# Patient Record
Sex: Male | Born: 1955 | Race: Black or African American | Hispanic: No | Marital: Married | Smoking: Former smoker
Health system: Southern US, Community
[De-identification: ages and names within clinical notes are randomized; demographics above are authoritative.]

## PROBLEM LIST (undated history)

## (undated) DIAGNOSIS — G5603 Carpal tunnel syndrome, bilateral upper limbs: Secondary | ICD-10-CM

## (undated) DIAGNOSIS — Z8679 Personal history of other diseases of the circulatory system: Secondary | ICD-10-CM

## (undated) DIAGNOSIS — G8929 Other chronic pain: Secondary | ICD-10-CM

## (undated) DIAGNOSIS — I10 Essential (primary) hypertension: Secondary | ICD-10-CM

## (undated) DIAGNOSIS — K760 Fatty (change of) liver, not elsewhere classified: Secondary | ICD-10-CM

## (undated) DIAGNOSIS — F909 Attention-deficit hyperactivity disorder, unspecified type: Secondary | ICD-10-CM

## (undated) DIAGNOSIS — N400 Enlarged prostate without lower urinary tract symptoms: Secondary | ICD-10-CM

## (undated) DIAGNOSIS — G4733 Obstructive sleep apnea (adult) (pediatric): Secondary | ICD-10-CM

## (undated) DIAGNOSIS — E785 Hyperlipidemia, unspecified: Secondary | ICD-10-CM

## (undated) DIAGNOSIS — G4700 Insomnia, unspecified: Secondary | ICD-10-CM

## (undated) DIAGNOSIS — I2510 Atherosclerotic heart disease of native coronary artery without angina pectoris: Secondary | ICD-10-CM

## (undated) DIAGNOSIS — M1990 Unspecified osteoarthritis, unspecified site: Secondary | ICD-10-CM

## (undated) DIAGNOSIS — N189 Chronic kidney disease, unspecified: Secondary | ICD-10-CM

## (undated) DIAGNOSIS — K219 Gastro-esophageal reflux disease without esophagitis: Secondary | ICD-10-CM

## (undated) DIAGNOSIS — R0600 Dyspnea, unspecified: Secondary | ICD-10-CM

## (undated) DIAGNOSIS — D649 Anemia, unspecified: Secondary | ICD-10-CM

## (undated) DIAGNOSIS — G5601 Carpal tunnel syndrome, right upper limb: Secondary | ICD-10-CM

## (undated) HISTORY — PX: CERVICAL FUSION: SHX112

## (undated) HISTORY — DX: Gastro-esophageal reflux disease without esophagitis: K21.9

## (undated) HISTORY — PX: CARDIAC CATHETERIZATION: SHX172

## (undated) HISTORY — DX: Carpal tunnel syndrome, bilateral upper limbs: G56.03

## (undated) HISTORY — DX: Unspecified osteoarthritis, unspecified site: M19.90

## (undated) HISTORY — DX: Carpal tunnel syndrome, right upper limb: G56.01

## (undated) HISTORY — DX: Essential (primary) hypertension: I10

## (undated) HISTORY — DX: Fatty (change of) liver, not elsewhere classified: K76.0

## (undated) HISTORY — DX: Hyperlipidemia, unspecified: E78.5

## (undated) HISTORY — DX: Obstructive sleep apnea (adult) (pediatric): G47.33

## (undated) HISTORY — PX: LUMBAR FUSION: SHX111

---

## 1997-04-09 ENCOUNTER — Emergency Department (HOSPITAL_COMMUNITY): Admission: EM | Admit: 1997-04-09 | Discharge: 1997-04-09 | Payer: Self-pay | Admitting: Emergency Medicine

## 1997-07-20 ENCOUNTER — Encounter: Admission: RE | Admit: 1997-07-20 | Discharge: 1997-10-18 | Payer: Self-pay | Admitting: Internal Medicine

## 1997-09-17 ENCOUNTER — Ambulatory Visit: Admission: RE | Admit: 1997-09-17 | Discharge: 1997-09-17 | Payer: Self-pay | Admitting: Internal Medicine

## 1999-02-06 ENCOUNTER — Other Ambulatory Visit: Admission: RE | Admit: 1999-02-06 | Discharge: 1999-02-06 | Payer: Self-pay | Admitting: Urology

## 2001-01-05 HISTORY — PX: FINGER AMPUTATION: SHX636

## 2001-03-31 ENCOUNTER — Inpatient Hospital Stay (HOSPITAL_COMMUNITY): Admission: EM | Admit: 2001-03-31 | Discharge: 2001-04-05 | Payer: Self-pay | Admitting: Emergency Medicine

## 2001-03-31 ENCOUNTER — Encounter: Payer: Self-pay | Admitting: Emergency Medicine

## 2003-03-08 ENCOUNTER — Encounter: Payer: Self-pay | Admitting: Pulmonary Disease

## 2003-03-08 ENCOUNTER — Ambulatory Visit (HOSPITAL_BASED_OUTPATIENT_CLINIC_OR_DEPARTMENT_OTHER): Admission: RE | Admit: 2003-03-08 | Discharge: 2003-03-08 | Payer: Self-pay | Admitting: Internal Medicine

## 2004-05-06 ENCOUNTER — Ambulatory Visit: Payer: Self-pay | Admitting: Internal Medicine

## 2004-05-21 ENCOUNTER — Ambulatory Visit: Payer: Self-pay | Admitting: Internal Medicine

## 2004-05-21 ENCOUNTER — Ambulatory Visit: Payer: Self-pay

## 2004-07-22 ENCOUNTER — Ambulatory Visit: Payer: Self-pay | Admitting: Internal Medicine

## 2004-08-12 ENCOUNTER — Ambulatory Visit: Payer: Self-pay | Admitting: Internal Medicine

## 2004-10-01 ENCOUNTER — Ambulatory Visit: Payer: Self-pay | Admitting: Internal Medicine

## 2005-01-05 HISTORY — PX: ESOPHAGEAL DILATION: SHX303

## 2005-01-05 HISTORY — PX: COLONOSCOPY: SHX174

## 2005-01-05 LAB — HM COLONOSCOPY: HM Colonoscopy: NORMAL

## 2005-07-02 ENCOUNTER — Ambulatory Visit: Payer: Self-pay | Admitting: Internal Medicine

## 2005-07-31 ENCOUNTER — Ambulatory Visit: Payer: Self-pay | Admitting: Gastroenterology

## 2005-08-07 ENCOUNTER — Ambulatory Visit: Payer: Self-pay | Admitting: Internal Medicine

## 2005-08-28 ENCOUNTER — Encounter (INDEPENDENT_AMBULATORY_CARE_PROVIDER_SITE_OTHER): Payer: Self-pay | Admitting: *Deleted

## 2005-08-28 ENCOUNTER — Ambulatory Visit: Payer: Self-pay | Admitting: Gastroenterology

## 2005-12-16 ENCOUNTER — Ambulatory Visit: Payer: Self-pay | Admitting: Internal Medicine

## 2005-12-16 LAB — CONVERTED CEMR LAB
Basophils Absolute: 0.1 10*3/uL (ref 0.0–0.1)
Basophils Relative: 1.5 % — ABNORMAL HIGH (ref 0.0–1.0)
Eosinophil percent: 2.2 % (ref 0.0–5.0)
HCT: 38.7 % — ABNORMAL LOW (ref 39.0–52.0)
Hemoglobin: 13 g/dL (ref 13.0–17.0)
Lymphocytes Relative: 17.4 % (ref 12.0–46.0)
MCHC: 33.5 g/dL (ref 30.0–36.0)
MCV: 84.4 fL (ref 78.0–100.0)
Monocytes Absolute: 0.3 10*3/uL (ref 0.2–0.7)
Monocytes Relative: 4.8 % (ref 3.0–11.0)
Neutro Abs: 5.1 10*3/uL (ref 1.4–7.7)
Neutrophils Relative %: 74.1 % (ref 43.0–77.0)
Platelets: 279 10*3/uL (ref 150–400)
RBC: 4.59 M/uL (ref 4.22–5.81)
RDW: 13.3 % (ref 11.5–14.6)
WBC: 6.9 10*3/uL (ref 4.5–10.5)

## 2005-12-25 ENCOUNTER — Ambulatory Visit: Payer: Self-pay | Admitting: Internal Medicine

## 2005-12-25 LAB — CONVERTED CEMR LAB
Folate: 10 ng/mL
HCT: 38.3 % — ABNORMAL LOW (ref 39.0–52.0)
Hemoglobin: 12.6 g/dL — ABNORMAL LOW (ref 13.0–17.0)
INR: 0.9 (ref 0.9–2.0)
Iron: 80 ug/dL (ref 42–165)
MCHC: 33 g/dL (ref 30.0–36.0)
MCV: 85.2 fL (ref 78.0–100.0)
Platelets: 292 10*3/uL (ref 150–400)
Prothrombin Time: 11.9 s (ref 10.0–14.0)
RBC: 4.5 M/uL (ref 4.22–5.81)
RDW: 13.4 % (ref 11.5–14.6)
Saturation Ratios: 26.8 % (ref 20.0–50.0)
Transferrin: 213.1 mg/dL (ref >=212.0)
Vitamin B-12: 371 pg/mL (ref 211–911)
WBC: 4.7 10*3/uL (ref 4.5–10.5)
aPTT: 27.4 s (ref 26.5–36.5)

## 2006-03-11 ENCOUNTER — Ambulatory Visit: Payer: Self-pay | Admitting: Internal Medicine

## 2006-03-11 LAB — CONVERTED CEMR LAB
ALT: 80 units/L — ABNORMAL HIGH (ref 0–40)
AST: 45 units/L — ABNORMAL HIGH (ref 0–37)
BUN: 20 mg/dL (ref 6–23)
Cholesterol: 179 mg/dL (ref 0–200)
Creatinine, Ser: 1.5 mg/dL (ref 0.4–1.5)
HDL: 38.6 mg/dL — ABNORMAL LOW (ref >39.0)
Hemoglobin: 13 g/dL (ref 13.0–17.0)
LDL Cholesterol: 120 mg/dL — ABNORMAL HIGH (ref 0–99)
Potassium: 5 meq/L (ref 3.5–5.1)
Total CHOL/HDL Ratio: 4.6
Triglycerides: 103 mg/dL (ref 0–149)
VLDL: 21 mg/dL (ref 0–40)

## 2006-03-25 ENCOUNTER — Ambulatory Visit: Payer: Self-pay | Admitting: Internal Medicine

## 2006-05-24 ENCOUNTER — Encounter: Admission: RE | Admit: 2006-05-24 | Discharge: 2006-05-24 | Payer: Self-pay | Admitting: Internal Medicine

## 2006-05-24 ENCOUNTER — Ambulatory Visit: Payer: Self-pay | Admitting: Internal Medicine

## 2006-05-26 DIAGNOSIS — G4733 Obstructive sleep apnea (adult) (pediatric): Secondary | ICD-10-CM | POA: Insufficient documentation

## 2006-05-26 DIAGNOSIS — E1169 Type 2 diabetes mellitus with other specified complication: Secondary | ICD-10-CM | POA: Insufficient documentation

## 2006-05-26 DIAGNOSIS — E785 Hyperlipidemia, unspecified: Secondary | ICD-10-CM

## 2006-06-07 ENCOUNTER — Telehealth (INDEPENDENT_AMBULATORY_CARE_PROVIDER_SITE_OTHER): Payer: Self-pay | Admitting: *Deleted

## 2006-06-07 DIAGNOSIS — M545 Low back pain, unspecified: Secondary | ICD-10-CM | POA: Insufficient documentation

## 2006-06-11 ENCOUNTER — Ambulatory Visit: Payer: Self-pay | Admitting: Internal Medicine

## 2006-06-11 DIAGNOSIS — E1129 Type 2 diabetes mellitus with other diabetic kidney complication: Secondary | ICD-10-CM | POA: Insufficient documentation

## 2006-06-11 DIAGNOSIS — M549 Dorsalgia, unspecified: Secondary | ICD-10-CM

## 2006-06-11 DIAGNOSIS — E1121 Type 2 diabetes mellitus with diabetic nephropathy: Secondary | ICD-10-CM | POA: Insufficient documentation

## 2006-06-11 DIAGNOSIS — B360 Pityriasis versicolor: Secondary | ICD-10-CM | POA: Insufficient documentation

## 2006-06-11 DIAGNOSIS — G8929 Other chronic pain: Secondary | ICD-10-CM | POA: Insufficient documentation

## 2006-06-11 DIAGNOSIS — D6489 Other specified anemias: Secondary | ICD-10-CM | POA: Insufficient documentation

## 2006-06-12 ENCOUNTER — Encounter: Admission: RE | Admit: 2006-06-12 | Discharge: 2006-06-12 | Payer: Self-pay | Admitting: Internal Medicine

## 2006-06-12 LAB — CONVERTED CEMR LAB
ALT: 67 units/L — ABNORMAL HIGH (ref 0–40)
AST: 44 units/L — ABNORMAL HIGH (ref 0–37)
Albumin: 4 g/dL (ref 3.5–5.2)
Alkaline Phosphatase: 79 units/L (ref 39–117)
BUN: 19 mg/dL (ref 6–23)
Basophils Absolute: 0 10*3/uL (ref 0.0–0.1)
Basophils Relative: 0.2 % (ref 0.0–1.0)
Bilirubin, Direct: 0.1 mg/dL (ref 0.0–0.3)
CO2: 26 meq/L (ref 19–32)
Calcium: 9.7 mg/dL (ref 8.4–10.5)
Chloride: 108 meq/L (ref 96–112)
Cholesterol: 188 mg/dL (ref 0–200)
Creatinine, Ser: 1.4 mg/dL (ref 0.4–1.5)
Creatinine,U: 88.9 mg/dL
Eosinophils Absolute: 0.1 10*3/uL (ref 0.0–0.6)
Eosinophils Relative: 1.6 % (ref 0.0–5.0)
GFR calc Af Amer: 69 mL/min
GFR calc non Af Amer: 57 mL/min
Glucose, Bld: 163 mg/dL — ABNORMAL HIGH (ref 70–99)
HCT: 36.2 % — ABNORMAL LOW (ref 39.0–52.0)
HDL: 37.2 mg/dL — ABNORMAL LOW (ref >39.0)
Hemoglobin: 12.2 g/dL — ABNORMAL LOW (ref 13.0–17.0)
Hgb A1c MFr Bld: 11.8 % — ABNORMAL HIGH (ref 4.6–6.0)
LDL Cholesterol: 135 mg/dL — ABNORMAL HIGH (ref 0–99)
Lymphocytes Relative: 25.6 % (ref 12.0–46.0)
MCHC: 33.8 g/dL (ref 30.0–36.0)
MCV: 83 fL (ref 78.0–100.0)
Microalb Creat Ratio: 40.5 mg/g — ABNORMAL HIGH (ref 0.0–30.0)
Microalb, Ur: 3.6 mg/dL — ABNORMAL HIGH (ref 0.0–1.9)
Monocytes Absolute: 0.4 10*3/uL (ref 0.2–0.7)
Monocytes Relative: 8.9 % (ref 3.0–11.0)
Neutro Abs: 2.9 10*3/uL (ref 1.4–7.7)
Neutrophils Relative %: 63.7 % (ref 43.0–77.0)
Platelets: 246 10*3/uL (ref 150–400)
Potassium: 4.6 meq/L (ref 3.5–5.1)
RBC: 4.36 M/uL (ref 4.22–5.81)
RDW: 12.9 % (ref 11.5–14.6)
Sodium: 141 meq/L (ref 135–145)
Total Bilirubin: 0.7 mg/dL (ref 0.3–1.2)
Total CHOL/HDL Ratio: 5.1
Total Protein: 7.2 g/dL (ref 6.0–8.3)
Triglycerides: 78 mg/dL (ref 0–149)
VLDL: 16 mg/dL (ref 0–40)
WBC: 4.6 10*3/uL (ref 4.5–10.5)

## 2006-06-14 ENCOUNTER — Telehealth (INDEPENDENT_AMBULATORY_CARE_PROVIDER_SITE_OTHER): Payer: Self-pay | Admitting: *Deleted

## 2006-06-15 ENCOUNTER — Telehealth: Payer: Self-pay | Admitting: Internal Medicine

## 2006-06-17 ENCOUNTER — Ambulatory Visit: Payer: Self-pay | Admitting: Internal Medicine

## 2006-06-22 ENCOUNTER — Telehealth (INDEPENDENT_AMBULATORY_CARE_PROVIDER_SITE_OTHER): Payer: Self-pay | Admitting: *Deleted

## 2006-06-22 ENCOUNTER — Encounter (INDEPENDENT_AMBULATORY_CARE_PROVIDER_SITE_OTHER): Payer: Self-pay | Admitting: *Deleted

## 2006-06-28 ENCOUNTER — Ambulatory Visit: Payer: Self-pay | Admitting: Internal Medicine

## 2006-06-30 ENCOUNTER — Encounter: Payer: Self-pay | Admitting: Internal Medicine

## 2006-07-01 ENCOUNTER — Encounter: Payer: Self-pay | Admitting: Internal Medicine

## 2006-07-14 ENCOUNTER — Encounter: Payer: Self-pay | Admitting: Internal Medicine

## 2006-08-19 ENCOUNTER — Ambulatory Visit: Payer: Self-pay | Admitting: Internal Medicine

## 2006-08-19 LAB — CONVERTED CEMR LAB
ALT: 61 units/L — ABNORMAL HIGH (ref 0–53)
AST: 39 units/L — ABNORMAL HIGH (ref 0–37)
BUN: 32 mg/dL — ABNORMAL HIGH (ref 6–23)
Creatinine, Ser: 1.8 mg/dL — ABNORMAL HIGH (ref 0.4–1.5)
Hgb A1c MFr Bld: 7 % — ABNORMAL HIGH (ref 4.6–6.0)
Potassium: 4.8 meq/L (ref 3.5–5.1)

## 2006-08-26 ENCOUNTER — Ambulatory Visit: Payer: Self-pay | Admitting: Internal Medicine

## 2006-08-26 DIAGNOSIS — E161 Other hypoglycemia: Secondary | ICD-10-CM | POA: Insufficient documentation

## 2006-10-19 ENCOUNTER — Encounter: Payer: Self-pay | Admitting: Internal Medicine

## 2006-11-02 ENCOUNTER — Encounter: Payer: Self-pay | Admitting: Internal Medicine

## 2006-12-23 ENCOUNTER — Encounter: Payer: Self-pay | Admitting: Internal Medicine

## 2007-01-05 ENCOUNTER — Encounter: Payer: Self-pay | Admitting: Internal Medicine

## 2007-02-14 ENCOUNTER — Telehealth (INDEPENDENT_AMBULATORY_CARE_PROVIDER_SITE_OTHER): Payer: Self-pay | Admitting: *Deleted

## 2007-02-21 ENCOUNTER — Telehealth (INDEPENDENT_AMBULATORY_CARE_PROVIDER_SITE_OTHER): Payer: Self-pay | Admitting: *Deleted

## 2007-03-30 ENCOUNTER — Emergency Department (HOSPITAL_COMMUNITY): Admission: EM | Admit: 2007-03-30 | Discharge: 2007-03-30 | Payer: Self-pay | Admitting: Emergency Medicine

## 2007-03-30 ENCOUNTER — Telehealth (INDEPENDENT_AMBULATORY_CARE_PROVIDER_SITE_OTHER): Payer: Self-pay | Admitting: *Deleted

## 2007-04-06 ENCOUNTER — Ambulatory Visit: Payer: Self-pay | Admitting: Internal Medicine

## 2007-04-07 ENCOUNTER — Encounter: Payer: Self-pay | Admitting: Internal Medicine

## 2007-04-25 ENCOUNTER — Encounter: Payer: Self-pay | Admitting: Internal Medicine

## 2007-06-27 ENCOUNTER — Telehealth (INDEPENDENT_AMBULATORY_CARE_PROVIDER_SITE_OTHER): Payer: Self-pay | Admitting: *Deleted

## 2007-08-15 ENCOUNTER — Telehealth (INDEPENDENT_AMBULATORY_CARE_PROVIDER_SITE_OTHER): Payer: Self-pay | Admitting: *Deleted

## 2007-08-19 ENCOUNTER — Encounter: Admission: RE | Admit: 2007-08-19 | Discharge: 2007-08-19 | Payer: Self-pay | Admitting: Neurosurgery

## 2007-08-23 ENCOUNTER — Encounter: Payer: Self-pay | Admitting: Internal Medicine

## 2007-09-12 ENCOUNTER — Encounter: Payer: Self-pay | Admitting: Internal Medicine

## 2007-11-07 ENCOUNTER — Telehealth (INDEPENDENT_AMBULATORY_CARE_PROVIDER_SITE_OTHER): Payer: Self-pay | Admitting: *Deleted

## 2007-11-28 ENCOUNTER — Telehealth (INDEPENDENT_AMBULATORY_CARE_PROVIDER_SITE_OTHER): Payer: Self-pay | Admitting: *Deleted

## 2007-11-30 ENCOUNTER — Ambulatory Visit: Payer: Self-pay | Admitting: Internal Medicine

## 2007-11-30 DIAGNOSIS — M109 Gout, unspecified: Secondary | ICD-10-CM | POA: Insufficient documentation

## 2007-11-30 DIAGNOSIS — E559 Vitamin D deficiency, unspecified: Secondary | ICD-10-CM | POA: Insufficient documentation

## 2007-11-30 DIAGNOSIS — M2550 Pain in unspecified joint: Secondary | ICD-10-CM | POA: Insufficient documentation

## 2007-11-30 DIAGNOSIS — N400 Enlarged prostate without lower urinary tract symptoms: Secondary | ICD-10-CM | POA: Insufficient documentation

## 2007-12-09 ENCOUNTER — Encounter (INDEPENDENT_AMBULATORY_CARE_PROVIDER_SITE_OTHER): Payer: Self-pay | Admitting: *Deleted

## 2007-12-09 ENCOUNTER — Telehealth (INDEPENDENT_AMBULATORY_CARE_PROVIDER_SITE_OTHER): Payer: Self-pay | Admitting: *Deleted

## 2007-12-27 ENCOUNTER — Ambulatory Visit: Payer: Self-pay | Admitting: Pulmonary Disease

## 2008-01-19 ENCOUNTER — Encounter: Payer: Self-pay | Admitting: Internal Medicine

## 2008-01-20 ENCOUNTER — Encounter: Payer: Self-pay | Admitting: Pulmonary Disease

## 2008-01-23 ENCOUNTER — Encounter: Payer: Self-pay | Admitting: Internal Medicine

## 2008-03-13 ENCOUNTER — Telehealth: Payer: Self-pay | Admitting: Internal Medicine

## 2008-03-20 ENCOUNTER — Ambulatory Visit: Payer: Self-pay | Admitting: Internal Medicine

## 2008-04-17 ENCOUNTER — Ambulatory Visit: Payer: Self-pay | Admitting: Internal Medicine

## 2008-04-25 LAB — CONVERTED CEMR LAB
Basophils Absolute: 0 10*3/uL (ref 0.0–0.1)
Basophils Relative: 0.3 % (ref 0.0–3.0)
Creatinine, Ser: 1.5 mg/dL (ref 0.4–1.5)
Eosinophils Absolute: 0.1 10*3/uL (ref 0.0–0.7)
Eosinophils Relative: 2.5 % (ref 0.0–5.0)
Folate: 7.5 ng/mL
HCT: 35.7 % — ABNORMAL LOW (ref 39.0–52.0)
Hemoglobin: 11.7 g/dL — ABNORMAL LOW (ref 13.0–17.0)
Hgb A1c MFr Bld: 6.4 % (ref 4.6–6.5)
Iron: 54 ug/dL (ref 42–165)
Lymphocytes Relative: 24.2 % (ref 12.0–46.0)
Lymphs Abs: 1.4 10*3/uL (ref 0.7–4.0)
MCHC: 32.8 g/dL (ref 30.0–36.0)
MCV: 84.1 fL (ref 78.0–100.0)
Monocytes Absolute: 0.6 10*3/uL (ref 0.1–1.0)
Monocytes Relative: 11 % (ref 3.0–12.0)
Neutro Abs: 3.5 10*3/uL (ref 1.4–7.7)
Neutrophils Relative %: 62 % (ref 43.0–77.0)
Platelets: 196 10*3/uL (ref 150.0–400.0)
RBC: 4.25 M/uL (ref 4.22–5.81)
RDW: 13.9 % (ref 11.5–14.6)
Saturation Ratios: 16.7 % — ABNORMAL LOW (ref 20.0–50.0)
Transferrin: 231.4 mg/dL (ref 212.0–360.0)
Vitamin B-12: 315 pg/mL (ref 211–911)
WBC: 5.6 10*3/uL (ref 4.5–10.5)

## 2008-04-26 ENCOUNTER — Telehealth (INDEPENDENT_AMBULATORY_CARE_PROVIDER_SITE_OTHER): Payer: Self-pay | Admitting: *Deleted

## 2008-05-02 ENCOUNTER — Encounter (INDEPENDENT_AMBULATORY_CARE_PROVIDER_SITE_OTHER): Payer: Self-pay | Admitting: *Deleted

## 2008-05-28 ENCOUNTER — Encounter: Payer: Self-pay | Admitting: Internal Medicine

## 2008-05-29 ENCOUNTER — Encounter (INDEPENDENT_AMBULATORY_CARE_PROVIDER_SITE_OTHER): Payer: Self-pay | Admitting: *Deleted

## 2008-05-29 ENCOUNTER — Ambulatory Visit: Payer: Self-pay | Admitting: Internal Medicine

## 2008-05-29 LAB — CONVERTED CEMR LAB
OCCULT 1: NEGATIVE
OCCULT 2: NEGATIVE
OCCULT 3: NEGATIVE

## 2008-06-11 ENCOUNTER — Encounter: Payer: Self-pay | Admitting: Internal Medicine

## 2008-06-12 ENCOUNTER — Ambulatory Visit (HOSPITAL_COMMUNITY): Admission: RE | Admit: 2008-06-12 | Discharge: 2008-06-12 | Payer: Self-pay | Admitting: Neurosurgery

## 2008-06-15 ENCOUNTER — Encounter: Payer: Self-pay | Admitting: Internal Medicine

## 2008-07-24 ENCOUNTER — Encounter: Payer: Self-pay | Admitting: Internal Medicine

## 2008-08-02 ENCOUNTER — Ambulatory Visit: Payer: Self-pay | Admitting: Internal Medicine

## 2008-08-02 DIAGNOSIS — M48061 Spinal stenosis, lumbar region without neurogenic claudication: Secondary | ICD-10-CM | POA: Insufficient documentation

## 2008-08-07 ENCOUNTER — Encounter (INDEPENDENT_AMBULATORY_CARE_PROVIDER_SITE_OTHER): Payer: Self-pay | Admitting: *Deleted

## 2008-08-07 ENCOUNTER — Telehealth (INDEPENDENT_AMBULATORY_CARE_PROVIDER_SITE_OTHER): Payer: Self-pay | Admitting: *Deleted

## 2008-08-31 ENCOUNTER — Encounter: Payer: Self-pay | Admitting: Internal Medicine

## 2008-09-06 ENCOUNTER — Ambulatory Visit (HOSPITAL_COMMUNITY): Admission: RE | Admit: 2008-09-06 | Discharge: 2008-09-07 | Payer: Self-pay | Admitting: Neurosurgery

## 2008-09-26 ENCOUNTER — Encounter: Payer: Self-pay | Admitting: Internal Medicine

## 2008-12-04 ENCOUNTER — Encounter: Payer: Self-pay | Admitting: Internal Medicine

## 2009-01-01 ENCOUNTER — Telehealth (INDEPENDENT_AMBULATORY_CARE_PROVIDER_SITE_OTHER): Payer: Self-pay | Admitting: *Deleted

## 2009-01-23 ENCOUNTER — Encounter (INDEPENDENT_AMBULATORY_CARE_PROVIDER_SITE_OTHER): Payer: Self-pay | Admitting: *Deleted

## 2009-01-24 ENCOUNTER — Telehealth (INDEPENDENT_AMBULATORY_CARE_PROVIDER_SITE_OTHER): Payer: Self-pay | Admitting: *Deleted

## 2009-01-30 ENCOUNTER — Ambulatory Visit: Payer: Self-pay | Admitting: Internal Medicine

## 2009-02-01 LAB — CONVERTED CEMR LAB: Vit D, 25-Hydroxy: 59 ng/mL (ref 30–89)

## 2009-02-03 LAB — CONVERTED CEMR LAB
ALT: 22 units/L (ref 0–53)
AST: 24 units/L (ref 0–37)
Albumin: 4.4 g/dL (ref 3.5–5.2)
Alkaline Phosphatase: 66 units/L (ref 39–117)
Bilirubin, Direct: 0 mg/dL (ref 0.0–0.3)
Cholesterol: 125 mg/dL (ref 0–200)
HDL: 50.3 mg/dL (ref >39.00)
LDL Cholesterol: 58 mg/dL (ref 0–99)
Total Bilirubin: 0.7 mg/dL (ref 0.3–1.2)
Total CHOL/HDL Ratio: 2
Total Protein: 7.6 g/dL (ref 6.0–8.3)
Triglycerides: 85 mg/dL (ref 0.0–149.0)
VLDL: 17 mg/dL (ref 0.0–40.0)

## 2009-02-05 ENCOUNTER — Telehealth (INDEPENDENT_AMBULATORY_CARE_PROVIDER_SITE_OTHER): Payer: Self-pay | Admitting: *Deleted

## 2009-02-11 ENCOUNTER — Ambulatory Visit: Payer: Self-pay | Admitting: Internal Medicine

## 2009-02-11 LAB — CONVERTED CEMR LAB: Hgb A1c MFr Bld: 6.6 % — ABNORMAL HIGH (ref 4.6–6.5)

## 2009-02-15 ENCOUNTER — Ambulatory Visit: Payer: Self-pay | Admitting: Internal Medicine

## 2009-02-21 ENCOUNTER — Telehealth (INDEPENDENT_AMBULATORY_CARE_PROVIDER_SITE_OTHER): Payer: Self-pay | Admitting: *Deleted

## 2009-02-22 ENCOUNTER — Encounter: Payer: Self-pay | Admitting: Internal Medicine

## 2009-03-18 ENCOUNTER — Encounter: Payer: Self-pay | Admitting: Internal Medicine

## 2009-04-09 ENCOUNTER — Encounter: Payer: Self-pay | Admitting: Internal Medicine

## 2009-04-09 ENCOUNTER — Telehealth (INDEPENDENT_AMBULATORY_CARE_PROVIDER_SITE_OTHER): Payer: Self-pay | Admitting: *Deleted

## 2009-04-29 ENCOUNTER — Encounter: Admission: RE | Admit: 2009-04-29 | Discharge: 2009-06-19 | Payer: Self-pay | Admitting: Neurosurgery

## 2009-06-10 ENCOUNTER — Telehealth (INDEPENDENT_AMBULATORY_CARE_PROVIDER_SITE_OTHER): Payer: Self-pay | Admitting: *Deleted

## 2009-06-11 ENCOUNTER — Encounter: Payer: Self-pay | Admitting: Internal Medicine

## 2009-06-26 ENCOUNTER — Ambulatory Visit (HOSPITAL_COMMUNITY): Admission: RE | Admit: 2009-06-26 | Discharge: 2009-06-26 | Payer: Self-pay | Admitting: Neurosurgery

## 2009-06-27 ENCOUNTER — Encounter: Payer: Self-pay | Admitting: Internal Medicine

## 2009-07-01 ENCOUNTER — Telehealth (INDEPENDENT_AMBULATORY_CARE_PROVIDER_SITE_OTHER): Payer: Self-pay | Admitting: *Deleted

## 2009-08-12 ENCOUNTER — Telehealth (INDEPENDENT_AMBULATORY_CARE_PROVIDER_SITE_OTHER): Payer: Self-pay | Admitting: *Deleted

## 2009-09-03 ENCOUNTER — Encounter: Payer: Self-pay | Admitting: Internal Medicine

## 2009-10-07 ENCOUNTER — Telehealth (INDEPENDENT_AMBULATORY_CARE_PROVIDER_SITE_OTHER): Payer: Self-pay | Admitting: *Deleted

## 2009-10-21 ENCOUNTER — Telehealth (INDEPENDENT_AMBULATORY_CARE_PROVIDER_SITE_OTHER): Payer: Self-pay | Admitting: *Deleted

## 2009-11-11 ENCOUNTER — Telehealth (INDEPENDENT_AMBULATORY_CARE_PROVIDER_SITE_OTHER): Payer: Self-pay | Admitting: *Deleted

## 2009-12-09 ENCOUNTER — Encounter: Payer: Self-pay | Admitting: Gastroenterology

## 2009-12-09 ENCOUNTER — Ambulatory Visit: Payer: Self-pay | Admitting: Internal Medicine

## 2009-12-09 DIAGNOSIS — R109 Unspecified abdominal pain: Secondary | ICD-10-CM | POA: Insufficient documentation

## 2009-12-10 LAB — CONVERTED CEMR LAB
ALT: 14 units/L (ref 0–53)
AST: 21 units/L (ref 0–37)
Albumin: 4.5 g/dL (ref 3.5–5.2)
Alkaline Phosphatase: 79 units/L (ref 39–117)
Amylase: 104 units/L (ref 27–131)
BUN: 21 mg/dL (ref 6–23)
Basophils Absolute: 0 10*3/uL (ref 0.0–0.1)
Basophils Relative: 0.3 % (ref 0.0–3.0)
Bilirubin, Direct: 0.1 mg/dL (ref 0.0–0.3)
Creatinine, Ser: 1.7 mg/dL — ABNORMAL HIGH (ref 0.4–1.5)
Creatinine,U: 179.9 mg/dL
Eosinophils Absolute: 0.1 10*3/uL (ref 0.0–0.7)
Eosinophils Relative: 1.1 % (ref 0.0–5.0)
HCT: 39.3 % (ref 39.0–52.0)
Hemoglobin: 12.9 g/dL — ABNORMAL LOW (ref 13.0–17.0)
Hgb A1c MFr Bld: 6.4 % (ref 4.6–6.5)
Lipase: 35 units/L (ref 11.0–59.0)
Lymphocytes Relative: 27.5 % (ref 12.0–46.0)
Lymphs Abs: 1.6 10*3/uL (ref 0.7–4.0)
MCHC: 32.9 g/dL (ref 30.0–36.0)
MCV: 84.9 fL (ref 78.0–100.0)
Microalb Creat Ratio: 1 mg/g (ref 0.0–30.0)
Microalb, Ur: 1.8 mg/dL (ref 0.0–1.9)
Monocytes Absolute: 0.5 10*3/uL (ref 0.1–1.0)
Monocytes Relative: 7.9 % (ref 3.0–12.0)
Neutro Abs: 3.7 10*3/uL (ref 1.4–7.7)
Neutrophils Relative %: 63.2 % (ref 43.0–77.0)
Platelets: 245 10*3/uL (ref 150.0–400.0)
Potassium: 5.1 meq/L (ref 3.5–5.1)
RBC: 4.62 M/uL (ref 4.22–5.81)
RDW: 15.5 % — ABNORMAL HIGH (ref 11.5–14.6)
Total Bilirubin: 0.7 mg/dL (ref 0.3–1.2)
Total Protein: 7.7 g/dL (ref 6.0–8.3)
WBC: 5.9 10*3/uL (ref 4.5–10.5)

## 2009-12-17 ENCOUNTER — Encounter (INDEPENDENT_AMBULATORY_CARE_PROVIDER_SITE_OTHER): Payer: Self-pay | Admitting: *Deleted

## 2009-12-17 ENCOUNTER — Ambulatory Visit: Payer: Self-pay | Admitting: Internal Medicine

## 2009-12-17 LAB — CONVERTED CEMR LAB
OCCULT 1: NEGATIVE
OCCULT 2: NEGATIVE
OCCULT 3: NEGATIVE

## 2010-01-01 ENCOUNTER — Encounter: Payer: Self-pay | Admitting: Internal Medicine

## 2010-01-17 ENCOUNTER — Encounter: Payer: Self-pay | Admitting: Internal Medicine

## 2010-01-21 ENCOUNTER — Ambulatory Visit (HOSPITAL_COMMUNITY)
Admission: RE | Admit: 2010-01-21 | Discharge: 2010-01-21 | Payer: Self-pay | Source: Home / Self Care | Attending: Neurosurgery | Admitting: Neurosurgery

## 2010-01-21 ENCOUNTER — Ambulatory Visit
Admission: RE | Admit: 2010-01-21 | Discharge: 2010-01-21 | Payer: Self-pay | Source: Home / Self Care | Attending: Gastroenterology | Admitting: Gastroenterology

## 2010-01-23 ENCOUNTER — Encounter: Payer: Self-pay | Admitting: Internal Medicine

## 2010-01-24 ENCOUNTER — Ambulatory Visit (HOSPITAL_COMMUNITY)
Admission: RE | Admit: 2010-01-24 | Discharge: 2010-01-24 | Payer: Self-pay | Source: Home / Self Care | Attending: Neurosurgery | Admitting: Neurosurgery

## 2010-01-26 ENCOUNTER — Encounter: Payer: Self-pay | Admitting: Neurosurgery

## 2010-01-27 LAB — DIFFERENTIAL
Basophils Absolute: 0 10*3/uL (ref 0.0–0.1)
Basophils Relative: 0 % (ref 0–1)
Eosinophils Absolute: 0.1 10*3/uL (ref 0.0–0.7)
Eosinophils Relative: 2 % (ref 0–5)
Lymphocytes Relative: 28 % (ref 12–46)
Lymphs Abs: 1.5 10*3/uL (ref 0.7–4.0)
Monocytes Absolute: 0.4 10*3/uL (ref 0.1–1.0)
Monocytes Relative: 7 % (ref 3–12)
Neutro Abs: 3.4 10*3/uL (ref 1.7–7.7)
Neutrophils Relative %: 64 % (ref 43–77)

## 2010-01-27 LAB — CBC
HCT: 38.3 % — ABNORMAL LOW (ref 39.0–52.0)
Hemoglobin: 12.5 g/dL — ABNORMAL LOW (ref 13.0–17.0)
MCH: 26.4 pg (ref 26.0–34.0)
MCHC: 32.6 g/dL (ref 30.0–36.0)
MCV: 81 fL (ref 78.0–100.0)
Platelets: 211 10*3/uL (ref 150–400)
RBC: 4.73 MIL/uL (ref 4.22–5.81)
RDW: 14 % (ref 11.5–15.5)
WBC: 5.3 10*3/uL (ref 4.0–10.5)

## 2010-01-27 LAB — COMPREHENSIVE METABOLIC PANEL
ALT: 23 U/L (ref 0–53)
AST: 18 U/L (ref 0–37)
Albumin: 4.1 g/dL (ref 3.5–5.2)
Alkaline Phosphatase: 86 U/L (ref 39–117)
BUN: 13 mg/dL (ref 6–23)
CO2: 25 mEq/L (ref 19–32)
Calcium: 9.7 mg/dL (ref 8.4–10.5)
Chloride: 109 mEq/L (ref 96–112)
Creatinine, Ser: 1.55 mg/dL — ABNORMAL HIGH (ref 0.4–1.5)
GFR calc Af Amer: 57 mL/min — ABNORMAL LOW (ref >60)
GFR calc non Af Amer: 47 mL/min — ABNORMAL LOW (ref >60)
Glucose, Bld: 105 mg/dL — ABNORMAL HIGH (ref 70–99)
Potassium: 4.6 mEq/L (ref 3.5–5.1)
Sodium: 143 mEq/L (ref 135–145)
Total Bilirubin: 0.4 mg/dL (ref 0.3–1.2)
Total Protein: 7.2 g/dL (ref 6.0–8.3)

## 2010-01-28 LAB — KAPPA/LAMBDA LIGHT CHAINS
Kappa free light chain: 1.77 mg/dL (ref 0.33–1.94)
Kappa, lambda light chain ratio: 1.49 (ref 0.26–1.65)
Lambda free light chains: 1.19 mg/dL (ref 0.57–2.63)

## 2010-01-29 LAB — PROTEIN ELECTROPH W RFLX QUANT IMMUNOGLOBULINS
Albumin ELP: 56.9 % (ref 55.8–66.1)
Alpha-1-Globulin: 4.4 % (ref 2.9–4.9)
Alpha-2-Globulin: 12.1 % — ABNORMAL HIGH (ref 7.1–11.8)
Beta 2: 4.3 % (ref 3.2–6.5)
Beta Globulin: 6.8 % (ref 4.7–7.2)
Gamma Globulin: 15.5 % (ref 11.1–18.8)
M-Spike, %: NOT DETECTED g/dL
Total Protein ELP: 7.9 g/dL (ref 6.0–8.3)

## 2010-01-29 LAB — IMMUNOFIXATION ELECTROPHORESIS
IgA: 223 mg/dL (ref 68–378)
IgG (Immunoglobin G), Serum: 1410 mg/dL (ref 694–1618)
IgM, Serum: 33 mg/dL — ABNORMAL LOW (ref 60–263)
Total Protein ELP: 8.1 g/dL (ref 6.0–8.3)

## 2010-02-02 LAB — CONVERTED CEMR LAB
ALT: 31 units/L (ref 0–53)
AST: 29 units/L (ref 0–37)
Albumin: 5 g/dL (ref 3.5–5.2)
Alkaline Phosphatase: 68 units/L (ref 39–117)
BUN: 22 mg/dL (ref 6–23)
BUN: 24 mg/dL — ABNORMAL HIGH (ref 6–23)
Basophils Absolute: 0 10*3/uL (ref 0.0–0.1)
Basophils Absolute: 0 10*3/uL (ref 0.0–0.1)
Basophils Relative: 0 % (ref 0–1)
Basophils Relative: 0.5 % (ref 0.0–3.0)
Bilirubin, Direct: 0.1 mg/dL (ref 0.0–0.3)
CO2: 21 meq/L (ref 19–32)
Calcium: 10.3 mg/dL (ref 8.4–10.5)
Chloride: 107 meq/L (ref 96–112)
Cholesterol: 135 mg/dL (ref 0–200)
Creatinine, Ser: 1.53 mg/dL — ABNORMAL HIGH (ref 0.40–1.50)
Creatinine, Ser: 1.7 mg/dL — ABNORMAL HIGH (ref 0.4–1.5)
Creatinine,U: 138.6 mg/dL
Eosinophils Absolute: 0.1 10*3/uL (ref 0.0–0.7)
Eosinophils Absolute: 0.1 10*3/uL (ref 0.0–0.7)
Eosinophils Relative: 1.6 % (ref 0.0–5.0)
Eosinophils Relative: 2 % (ref 0–5)
Glucose, Bld: 95 mg/dL (ref 70–99)
HCT: 36.2 % — ABNORMAL LOW (ref 39.0–52.0)
HCT: 38.3 % — ABNORMAL LOW (ref 39.0–52.0)
HDL: 48 mg/dL (ref >39)
Hemoglobin: 11.8 g/dL — ABNORMAL LOW (ref 13.0–17.0)
Hemoglobin: 12.7 g/dL — ABNORMAL LOW (ref 13.0–17.0)
Hgb A1c MFr Bld: 6.4 % (ref 4.6–6.5)
Hgb A1c MFr Bld: 6.5 % — ABNORMAL HIGH (ref 4.6–6.1)
Indirect Bilirubin: 0.5 mg/dL (ref 0.0–0.9)
LDL Cholesterol: 73 mg/dL (ref 0–99)
Lymphocytes Relative: 30 % (ref 12–46)
Lymphocytes Relative: 32.3 % (ref 12.0–46.0)
Lymphs Abs: 1.3 10*3/uL (ref 0.7–4.0)
Lymphs Abs: 1.6 10*3/uL (ref 0.7–4.0)
MCHC: 32.5 g/dL (ref 30.0–36.0)
MCHC: 33.2 g/dL (ref 30.0–36.0)
MCV: 79.6 fL (ref 78.0–100.0)
MCV: 84.9 fL (ref 78.0–100.0)
Microalb Creat Ratio: 4.3 mg/g (ref 0.0–30.0)
Microalb, Ur: 0.6 mg/dL (ref 0.0–1.9)
Monocytes Absolute: 0.4 10*3/uL (ref 0.1–1.0)
Monocytes Absolute: 0.4 10*3/uL (ref 0.1–1.0)
Monocytes Relative: 10 % (ref 3.0–12.0)
Monocytes Relative: 8 % (ref 3–12)
Neutro Abs: 2.1 10*3/uL (ref 1.4–7.7)
Neutro Abs: 3.2 10*3/uL (ref 1.7–7.7)
Neutrophils Relative %: 55.6 % (ref 43.0–77.0)
Neutrophils Relative %: 61 % (ref 43–77)
PSA: 0.46 ng/mL (ref 0.10–4.00)
Platelets: 205 10*3/uL (ref 150.0–400.0)
Platelets: 280 10*3/uL (ref 150–400)
Potassium: 5.2 meq/L (ref 3.5–5.3)
Potassium: 5.2 meq/L — ABNORMAL HIGH (ref 3.5–5.1)
RBC: 4.26 M/uL (ref 4.22–5.81)
RBC: 4.81 M/uL (ref 4.22–5.81)
RDW: 13.7 % (ref 11.5–14.6)
RDW: 14.5 % (ref 11.5–15.5)
Rhuematoid fact SerPl-aCnc: <20 intl units/mL (ref 0–20)
Sed Rate: 10 mm/hr (ref 0–16)
Sodium: 142 meq/L (ref 135–145)
TSH: 2.09 microintl units/mL (ref 0.350–4.50)
Total Bilirubin: 0.6 mg/dL (ref 0.3–1.2)
Total CHOL/HDL Ratio: 2.8
Total Protein: 7.8 g/dL (ref 6.0–8.3)
Triglycerides: 72 mg/dL (ref <150)
Uric Acid, Serum: 5.7 mg/dL (ref 4.0–7.8)
VLDL: 14 mg/dL (ref 0–40)
Vit D, 1,25-Dihydroxy: 56 (ref 30–89)
Vit D, 25-Hydroxy: 36 ng/mL (ref 30–89)
WBC: 3.9 10*3/uL — ABNORMAL LOW (ref 4.5–10.5)
WBC: 5.2 10*3/uL (ref 4.0–10.5)

## 2010-02-05 ENCOUNTER — Ambulatory Visit: Admit: 2010-02-05 | Payer: Self-pay | Admitting: Internal Medicine

## 2010-02-05 ENCOUNTER — Ambulatory Visit: Payer: Self-pay | Admitting: Internal Medicine

## 2010-02-06 ENCOUNTER — Encounter: Payer: Self-pay | Admitting: Internal Medicine

## 2010-02-06 NOTE — Progress Notes (Signed)
Summary: [name] AND ALLOPURINOL  Phone Note From Pharmacy   Caller: [place] Reason for Call: Needs renewal Summary of Call: DR HOPPER/ [facility] CALLED NEEDS AUTH ON  A 90 DAY SUPPLY IN LOTREL 10/20 MG 1 A DAY X 90 DAYS AND ALLOPURINOL 300 MG 1 TAB DAILY X 90 DAYS. # TO PHARMACY IS [id number] Initial call taken by: Charlcie Cradle,  February 14, 2007 9:56 AM  Follow-up for Phone Call        rx faxed to [facility] at [phone]...................................................................Marland KitchenVerdie Mosher  February 14, 2007 5:05 PM  Follow-up by: Verdie Mosher,  February 14, 2007 5:05 PM      Prescriptions: LOTREL 10-20 MG CAPS (AMLODIPINE BESY-BENAZEPRIL HCL) 1 by mouth qd  #90 x 1   Entered by:   Verdie Mosher   Authorized by:   Unice Cobble MD   Signed by:   Verdie Mosher on 02/14/2007   Method used:   Print then Give to Patient   RxID:   [id number] ALLOPURINOL 300 MG TABS (ALLOPURINOL) 1 by mouth qd  #90 x 1   Entered by:   Verdie Mosher   Authorized by:   Unice Cobble MD   Signed by:   Verdie Mosher on 02/14/2007   Method used:   Print then Give to Patient   RxID:   [id number]

## 2010-02-06 NOTE — Assessment & Plan Note (Signed)
Summary: follow up 2 weeks,cbs   Vital Signs:  Patient Profile:   55 Years Old Male Weight:      253.25 pounds Pulse rate:   64 / minute Pulse rhythm:   regular BP sitting:   102 / 60  (left arm) Cuff size:   large  Vitals Entered By: Janelle Floor (June 28, 2006 2:15 PM)               Chief Complaint:  follow up labs and medications.  History of Present Illness: patient not sure of medications given medication list to fill out and return  Current Allergies (reviewed today): No known allergies  Updated/Current Medications (including changes made in today's visit):  ACTOS 30 MG TABS (PIOGLITAZONE HCL) 1 by mouth two times a day GLIMEPIRIDE 4 MG TABS (GLIMEPIRIDE) 1/2 q am & 1 ac eve meal CRESTOR 20 MG TABS (ROSUVASTATIN CALCIUM) 1 by mouth qwed LOTREL 10-20 MG CAPS (AMLODIPINE BESY-BENAZEPRIL HCL) 1 by mouth qd ALLOPURINOL 300 MG TABS (ALLOPURINOL) 1 by mouth qd ASPIRIN 81 MG TBEC (ASPIRIN) 1 by mouth qd FISH OIL 1000 MG CAPS (OMEGA-3 FATTY ACIDS) 1 by mouth qd GLUCOPHAGE 1000 MG  TABS (METFORMIN HCL) 1 by mouth two times a day VICODIN 5-500 MG  TABS (HYDROCODONE-ACETAMINOPHEN) 1 by mouth q4-6 hours as needed pain FLEXERIL 10 MG  TABS (CYCLOBENZAPRINE HCL) one p.o. nightly p.r.n. pain DARVOCET-N 100   TABS (PROPOXYPHENE N-APAP TABS) as needed pain      Review of Systems  Neuro      to see Dr Sherwood Gambler 07/01/06   Physical Exam  General:     Well-developed,well-nourished,in no acute distress; alert,appropriate and cooperative throughout examination Heart:     Normal rate and regular rhythm. S1 and S2 normal without gallop, murmur, click, rub or other extra sounds.    Impression & Recommendations:  Problem # 1:  DIABETES MELLITUS, TYPE II (ICD-250.00)  His updated medication list for this problem includes:    Actos 30 Mg Tabs (Pioglitazone hcl) .Marland Kitchen... 1 by mouth two times a day    Glimepiride 4 Mg Tabs (Glimepiride) .Marland Kitchen... 1/2 q am & 1 ac eve meal    Lotrel  10-20 Mg Caps (Amlodipine besy-benazepril hcl) .Marland Kitchen... 1 by mouth qd    Aspirin 81 Mg Tbec (Aspirin) .Marland Kitchen... 1 by mouth qd    Glucophage 1000 Mg Tabs (Metformin hcl) .Marland Kitchen... 1 by mouth two times a day   Medications Added to Medication List This Visit: 1)  Glimepiride 4 Mg Tabs (Glimepiride) .... 1/2 q am & 1 ac eve meal 2)  Darvocet-n 100 Tabs (Propoxyphene n-apap tabs) .... As needed pain   Patient Instructions: 1)  Decrease Glimiperide to 1/2 before b'fast & 1 pill before eve meal. Am sugars should be 70-130; 2 hrs after largest meal < 180. Recheck A1c, BUN,creat,K,GOT,GPT (250.02,995.2,401.9,790.4) in mid August ; followup 2-3 days later.

## 2010-02-06 NOTE — Letter (Signed)
Summary: New Patient letter  [facility]  [address] [place], [place] [address]   Phone: [phone]  Fax: [phone]       12/09/2009 [name]  [name], [place]  [address]  Dear [name],  Welcome to the Gastroenterology Division at [facility].    You are scheduled to see Dr.  Ardis Hughs on 01-21-2010 at 9:15am on the 3rd floor at [facility], [place] [employer].  We ask that you try to arrive at our office 15 minutes prior to your appointment time to allow for check-in.  We would like you to complete the enclosed self-administered evaluation form prior to your visit and bring it with you on the day of your appointment.  We will review it with you.  Also, please bring a complete list of all your medications or, if you prefer, bring the medication bottles and we will list them.  Please bring your insurance card so that we may make a copy of it.  If your insurance requires a referral to see a specialist, please bring your referral form from your primary care physician.  Co-payments are due at the time of your visit and may be paid by cash, check or credit card.     Your office visit will consist of a consult with your physician (includes a physical exam), any laboratory testing he/she may order, scheduling of any necessary diagnostic testing (e.g. x-ray, ultrasound, CT-scan), and scheduling of a procedure (e.g. Endoscopy, Colonoscopy) if required.  Please allow enough time on your schedule to allow for any/all of these possibilities.    If you cannot keep your appointment, please call [phone] to cancel or reschedule prior to your appointment date.  This allows [place] the opportunity to schedule an appointment for another patient in need of care.  If you do not cancel or reschedule by 5 p.m. the business day prior to your appointment date, you will be charged a $50.00 late cancellation/no-show fee.    Thank you for  choosing [facility] for your medical needs.  We appreciate the opportunity to care for you.  Please visit [place] at our website  to learn more about our practice.                     Sincerely,                                                             The Gastroenterology Division

## 2010-02-06 NOTE — Progress Notes (Signed)
Summary: Refill Request  Phone Note Refill Request Message from:  Pharmacy on [place] Fax #: [phone]  Refills Requested: Medication #1:  Freestyle Lancets 100, use as directed   Dosage confirmed as above?Dosage Confirmed  Medication #2:  Freestyle Lite Test Strip 100, use as directed   Dosage confirmed as above?Dosage Confirmed Initial call taken by: Elna Breslow,  February 21, 2009 3:39 PM    New/Updated Medications: FREESTYLE LANCETS  MISC (LANCETS) Check bloodsugar 1-2 x daily FREESTYLE TEST  STRP (GLUCOSE BLOOD) Check Bloodsugar 1-2x daily Prescriptions: FREESTYLE TEST  STRP (GLUCOSE BLOOD) Check Bloodsugar 1-2x daily  #100 x 3   Entered by:   Georgette Dover   Authorized by:   Unice Cobble MD   Signed by:   Georgette Dover on 02/21/2009   Method used:   Electronically to        [facility] (retail)       [address], [place]  [address]       Ph: [phone]       Fax: [phone]   RxID:   [id number] FREESTYLE LANCETS  MISC (LANCETS) Check bloodsugar 1-2 x daily  #100 x 3   Entered by:   Georgette Dover   Authorized by:   Unice Cobble MD   Signed by:   Georgette Dover on 02/21/2009   Method used:   Electronically to        [facility]* (retail)       [address]       [place], [place]  [address]       Ph: [phone]       Fax: [phone]   RxID:   [id number]

## 2010-02-06 NOTE — Progress Notes (Signed)
Summary: **Lab Results**  Phone Note Outgoing Call Call back at Home Phone [phone] Call back at [phone]   Call placed by: [name],  April 26, 2008 4:08 PM Call placed to: Patient Details for Reason: RECENT LAB RESULTS Summary of Call: Left message on machine for patient to return call when avaliable, Reason for call:  Hematocrit was 38.3 in late  2009, now 35.7, mild progression. Iron saturation is low; please complete & return stool cards. Diabetes is well controlled. [name]  April 26, 2008 4:09 PM   Follow-up for Phone Call        Left message on machine for patient to return call when avaliable  Follow-up by: [name],  May 02, 2008 8:42 AM  Additional Follow-up for Phone Call Additional follow up Details #1::        SPOKE WITH PATIENT, PATIENT OK'D I NFORMATION, PATIENT NEEDS STOOL CARD-MAILED WITH COPY OF LABS./[name]  May 02, 2008 10:01 AM

## 2010-02-06 NOTE — Progress Notes (Signed)
Summary: fell on knee/ FYI ED  Phone Note Call from Patient Call back at [phone]   Caller: Patient Reason for Call: Acute Illness Summary of Call: dr. hopper patient fell on his knee on saturday and now he has a knot under his knee. from his knee down his leg is swollen. Initial call taken by: Larene Pickett,  March 30, 2007 8:44 AM  Follow-up for Phone Call        Spoke with pt who fell on sat on left knee now right below knee is a knot and leg is swollen. Recommend Ed today pt agreed...................................................................Marland KitchenVerdie Mosher  March 30, 2007 9:20 AM  Follow-up by: Verdie Mosher,  March 30, 2007 9:20 AM

## 2010-02-06 NOTE — Letter (Signed)
Summary: Results Follow up Letter  [facility]   [place], [place] [address]   Phone: [phone]  Fax: [phone]    12/09/2007 MRN: [id number]  [place], [place]  [address]  Dear Mr. PANDOLFO,  The following are the results of your recent test(s):  Test         Result    Pap Smear:        Normal _____  Not Normal _____ Comments: ______________________________________________________ Cholesterol: LDL(Bad cholesterol):         Your goal is less than:         HDL (Good cholesterol):       Your goal is more than: Comments:  ______________________________________________________ Mammogram:        Normal _____  Not Normal _____ Comments:  ___________________________________________________________________ Hemoccult:        Normal _____  Not normal _______ Comments:    _____________________________________________________________________ Other Tests: PLEASE SEE ATTACHED LABS DONE ON 11/30/2007 AND ATTACHED APPOINTMENT CARD TO RECHECK LABS IN 04/2008    We routinely do not discuss normal results over the telephone.  If you desire a copy of the results, or you have any questions about this information we can discuss them at your next office visit.   Sincerely,

## 2010-02-06 NOTE — Medication Information (Signed)
Summary: Letter Concerning Potential Adverse Effects of Concomitant Thera  Letter Concerning Potential Adverse Effects of Concomitant Therapy with Actos & Lyrica/[facility]   Imported By: Edmonia James 02/14/2008 12:54:25  _____________________________________________________________________  External Attachment:    Type:   Image     Comment:   External Document

## 2010-02-06 NOTE — Assessment & Plan Note (Signed)
Summary: LEG SWOLLEN AND SORE/CDJ   Vital Signs:  Patient Profile:   55 Years Old Male Weight:      256.6 pounds Temp:     97.6 degrees F oral Resp:     15 per minute BP sitting:   110 / 60  Pt. in pain?   yes    Location:   L knee    Intensity:   4-10    Type:       sharp & dull  Vitals Entered By: Dawson Bills (April 06, 2007 2:48 PM)                  Chief Complaint:  fell & hit left  leg 2 weeks ago on tree limb +swelling.  History of Present Illness: Pain worse with standing.  Initially area was bruised. Films in ER 03/28/07 neg See ROS. FBS < 110; pc glucoses not checked regularly but always < 180.    Updated Prior Medication List: ACTOS 45 MG  TABS (PIOGLITAZONE HCL) 1 by mouth qd CRESTOR 20 MG TABS (ROSUVASTATIN CALCIUM) 1 by mouth qwed LOTREL 10-20 MG CAPS (AMLODIPINE BESY-BENAZEPRIL HCL) 1 by mouth qd ALLOPURINOL 300 MG TABS (ALLOPURINOL) 1 by mouth qd ASPIRIN 81 MG TBEC (ASPIRIN) 1 by mouth qd FISH OIL 1000 MG CAPS (OMEGA-3 FATTY ACIDS) 1 by mouth qd GLUCOPHAGE 1000 MG  TABS (METFORMIN HCL) 1 by mouth two times a day * PRILOSEC 20MG  OTC  1 PO QD  * TYLENOL 2 TABS PRN  BYETTA 10 MCG PEN 10 MCG/0.04ML  SOLN (EXENATIDE)   Current Allergies (reviewed today): No known allergies      Review of Systems  General      Denies chills, fever, and sweats.  MS      Denies joint pain, joint redness, and joint swelling.      Swelling & pain below L knee not in joint   Physical Exam  General:     Well-developed,well-nourished,in no acute distress; alert,appropriate and cooperative throughout examination; mildly uncomfortable-appearing.   Pulses:     R and L dorsalis pedis and posterior tibial pulses are full and equal bilaterally Extremities:     trace left pedal edema.   No edema RLE. Neg Homan's.5.5X5.5 cm boss below L knee. Proximal tibia tender to compression /palpation. Healing abrasion @ inf aspect of boss.    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 1/2 q am & 1/2 ac eve meal  His updated medication list for this problem includes:    Actos 45 Mg Tabs (Pioglitazone hcl) .Marland Kitchen... 1 by mouth qd    Lotrel 10-20 Mg Caps (Amlodipine besy-benazepril hcl) .Marland Kitchen... 1 by mouth qd    Aspirin 81 Mg Tbec (Aspirin) .Marland Kitchen... 1 by mouth qd    Glucophage 1000 Mg Tabs (Metformin hcl) .Marland Kitchen... 1 by mouth two times a day    Byetta 10 Mcg Pen 10 Mcg/0.10ml Soln (Exenatide)   Complete Medication List: 1)  Actos 45 Mg Tabs (Pioglitazone hcl) .Marland Kitchen.. 1 by mouth qd 2)  Crestor 20 Mg Tabs (Rosuvastatin calcium) .Marland Kitchen.. 1 by mouth qwed 3)  Lotrel 10-20 Mg Caps (Amlodipine besy-benazepril hcl) .Marland Kitchen.. 1 by mouth qd 4)  Allopurinol 300 Mg Tabs (Allopurinol) .Marland Kitchen.. 1 by mouth  qd 5)  Aspirin 81 Mg Tbec (Aspirin) .Marland Kitchen.. 1 by mouth qd 6)  Fish Oil 1000 Mg Caps (Omega-3 fatty acids) .Marland Kitchen.. 1 by mouth qd 7)  Glucophage 1000 Mg Tabs (Metformin hcl) .Marland Kitchen.. 1 by mouth two times a day 8)  Prilosec 20mg  Otc 1 Po Qd  9)  Tylenol 2 Tabs Prn  10)  Byetta 10 Mcg Pen 10 Mcg/0.48ml Soln (Exenatide) 11)  Tramadol Hcl 50 Mg Tabs (Tramadol hcl) .Marland Kitchen.. 1-2 q 6-8 hrs as needed pain   Patient Instructions: 1)  Warm compresses three times a day as needed. 2)  Recommended remaining out of work for 4/1 & 04/07/07.    Prescriptions: TRAMADOL HCL 50 MG  TABS (TRAMADOL HCL) 1-2 q 6-8 hrs as needed pain  #30 x 0   Entered and Authorized by:   Unice Cobble MD   Signed by:   Unice Cobble MD on 04/06/2007   Method used:   Print then Give to Patient   RxID:   [id number]  ]

## 2010-02-06 NOTE — Letter (Signed)
Summary: [facility]  [facility]   Imported By: Edmonia James 01/04/2009 09:22:21  _____________________________________________________________________  External Attachment:    Type:   Image     Comment:   External Document

## 2010-02-06 NOTE — Progress Notes (Signed)
Summary: Refill Request  Phone Note Refill Request Call back at [phone] Message from:  Pharmacy on June 10, 2009 9:03 AM  Refills Requested: Medication #1:  CRESTOR 20 MG TABS 1 by mouth once daily except 1/2 Tues   Dosage confirmed as above?Dosage Confirmed   Supply Requested: 3 months   Last Refilled: 02/25/2009 [facility]  Next Appointment Scheduled: none Initial call taken by: Elna Breslow,  June 10, 2009 9:04 AM    Prescriptions: CRESTOR 20 MG TABS (ROSUVASTATIN CALCIUM) 1 by mouth once daily except 1/2 Tues, Th, & Sat  #30 Tablet x 5   Entered by:   Georgette Dover   Authorized by:   Unice Cobble MD   Signed by:   Georgette Dover on 06/10/2009   Method used:   Electronically to        [facility] (retail)       [address]       [place], [place]  [address]       Ph: [phone]       Fax: [phone]   RxID:   [id number]

## 2010-02-06 NOTE — Letter (Signed)
[facility]   Imported By: Velora Heckler 11/23/2006 15:47:16  _____________________________________________________________________  External Attachment:    Type:   Image     Comment:   External Document

## 2010-02-06 NOTE — Assessment & Plan Note (Signed)
Summary: Follow-up on labs and meds/scm   Vital Signs:  Patient profile:   55 year old male Weight:      261.6 pounds Pulse rate:   64 / minute Resp:     16 per minute BP sitting:   118 / 84  (left arm) Cuff size:   large  Vitals Entered By: Georgette Dover (February 15, 2009 9:15 AM) CC: Follow-up visit: Discuss labs and meds(Discuss changing Lotrel and pill vs Byetta)  Comments REVIEWED MED LIST, PATIENT AGREED DOSE AND INSTRUCTION CORRECT    Primary Care Provider:  Dr. Unice Cobble  CC:  Follow-up visit: Discuss labs and meds(Discuss changing Lotrel and pill vs Byetta) .  History of Present Illness: Labs reviewed & risks discussed. Lipids are incredibly good;LDL 58 on Crestor 20 mg once daily . A1c  6.6%, up from 6.4 %. FBS 105-120; no 2 hr post meal checks.Weight up  20 # post back sugery.  Allergies: No Known Drug Allergies  Review of Systems Eyes:  Denies blurring, double vision, and vision loss-both eyes; Exam due ; no retinopathy. CV:  Denies chest pain or discomfort, lightheadness, and near fainting. Derm:  Denies poor wound healing. Neuro:  Denies numbness and tingling. Endo:  Denies excessive hunger, excessive thirst, and excessive urination.  Physical Exam  General:  Well-developed,well-nourished; alert,appropriate and cooperative throughout examination Lungs:  Normal respiratory effort, chest expands symmetrically. Lungs are clear to auscultation, no crackles or wheezes. Heart:  regular rhythm and bradycardia.   S4 with slurring Pulses:  R and L carotid,radial,dorsalis pedis and posterior tibial pulses are full and equal bilaterally Extremities:  No clubbing, cyanosis, edema Skin:  Intact without suspicious lesions or rashes Psych:  memory intact for recent and remote, normally interactive, and good eye contact.     Impression & Recommendations:  Problem # 1:  DIABETES MELLITUS, TYPE II (ICD-250.00) Adequate control His updated medication list for this  problem includes:    Aspirin 81 Mg Tbec (Aspirin) .Marland Kitchen... 1 by mouth qd    Lotrel 10-20 Mg Caps (Amlodipine besy-benazepril hcl) .Marland Kitchen... 1 by mouth qd    Actos 45 Mg Tabs (Pioglitazone hcl) .Marland Kitchen... 1 by mouth qd    Glucophage 1000 Mg Tabs (Metformin hcl) .Marland Kitchen... 1/2 two times a day with 2 largest meals; stop altogether 2 days pre op    Byetta 10 Mcg Pen 10 Mcg/0.75ml Soln (Exenatide)  Problem # 2:  HYPERLIPIDEMIA (ICD-272.4) Excellent LDL His updated medication list for this problem includes:    Crestor 20 Mg Tabs (Rosuvastatin calcium) .Marland Kitchen... 1 by mouth once daily except 1/2 tues, th, & sat  Complete Medication List: 1)  Aspirin 81 Mg Tbec (Aspirin) .Marland Kitchen.. 1 by mouth qd 2)  Crestor 20 Mg Tabs (Rosuvastatin calcium) .Marland Kitchen.. 1 by mouth once daily except 1/2 tues, th, & sat 3)  Lotrel 10-20 Mg Caps (Amlodipine besy-benazepril hcl) .Marland Kitchen.. 1 by mouth qd 4)  Fish Oil 1000 Mg Caps (Omega-3 fatty acids) .Marland Kitchen.. 1 by mouth qd 5)  Allopurinol 300 Mg Tabs (Allopurinol) .Marland Kitchen.. 1 by mouth qd 6)  Actos 45 Mg Tabs (Pioglitazone hcl) .Marland Kitchen.. 1 by mouth qd 7)  Glucophage 1000 Mg Tabs (Metformin hcl) .... 1/2 two times a day with 2 largest meals; stop altogether 2 days pre op 8)  Byetta 10 Mcg Pen 10 Mcg/0.75ml Soln (Exenatide) 9)  Lyrica 150 Mg Caps (Pregabalin) .Marland Kitchen.. 1 by mouth two times a day 10)  Vitamin D 50,000  .Marland Kitchen.. 1 a week 11)  Nexium  40 Mg Cpdr (Esomeprazole magnesium) .Marland Kitchen.. 1 once daily as needed 12)  Vitamin D 1000 Unit Tabs (Cholecalciferol) .Marland Kitchen.. 1 once daily in addition to 50,000 international units once weekly 13)  Miralax Pack (Polyethylene glycol 3350) .... As directed per pack  Patient Instructions: 1)  Read ALL food & drink labels. Consume LESS THAN 40 grams of sugar/ day from those with High Fructose Corn  Syrup as #1,2 or #3 on label. 2)  Please schedule a follow-up appointment in 4 months. 3)  BUN,creat, K+, uric acid;: 4)  Lipid Panel; 5)  HbgA1C. Prescriptions: LOTREL 10-20 MG CAPS (AMLODIPINE  BESY-BENAZEPRIL HCL) 1 by mouth qd  #90 x 1   Entered and Authorized by:   Unice Cobble MD   Signed by:   Unice Cobble MD on 02/15/2009   Method used:   Print then Give to Patient   RxID:   [id number] ALLOPURINOL 300 MG TABS (ALLOPURINOL) 1 by mouth qd  #90 x 1   Entered and Authorized by:   Unice Cobble MD   Signed by:   Unice Cobble MD on 02/15/2009   Method used:   Print then Give to Patient   RxID:   [id number]

## 2010-02-06 NOTE — Letter (Signed)
Summary: Results Follow up Letter  [facility]   [place], [place] [address]   Phone: [phone]  Fax: [phone]    05/29/2008 [name]  [name], [place]  [address]  Dear [name],  The following are the results of your recent test(s):  Test         Result    Pap Smear:        Normal _____  Not Normal _____ Comments: ______________________________________________________ Cholesterol: LDL(Bad cholesterol):         Your goal is less than:         HDL (Good cholesterol):       Your goal is more than: Comments:  ______________________________________________________ Mammogram:        Normal _____  Not Normal _____ Comments:  ___________________________________________________________________ Hemoccult:        Normal __X___  Not normal _______ Comments:    _____________________________________________________________________ Other Tests:    We routinely do not discuss normal results over the telephone.  If you desire a copy of the results, or you have any questions about this information we can discuss them at your next office visit.   Sincerely,

## 2010-02-06 NOTE — Letter (Signed)
Summary: Results Follow up Letter  [facility]   [place], [place] [address]   Phone: [phone]  Fax: [phone]    08/07/2008 [name]  [place], [place]  [address]  Dear Mr. PIKER,  The following are the results of your recent test(s):  Test         Result    Pap Smear:        Normal _____  Not Normal _____ Comments: ______________________________________________________ Cholesterol: LDL(Bad cholesterol):         Your goal is less than:         HDL (Good cholesterol):       Your goal is more than: Comments:  ______________________________________________________ Mammogram:        Normal _____  Not Normal _____ Comments:  ___________________________________________________________________ Hemoccult:        Normal _____  Not normal _______ Comments:    _____________________________________________________________________ Other Tests: PLEASE SEE ATTACHED LABS DONE ON 08/02/2008    We routinely do not discuss normal results over the telephone.  If you desire a copy of the results, or you have any questions about this information we can discuss them at your next office visit.   Sincerely,

## 2010-02-06 NOTE — Letter (Signed)
Summary: [facility]  [facility]   Imported By: Edmonia James 08/06/2008 09:40:21  _____________________________________________________________________  External Attachment:    Type:   Image     Comment:   External Document

## 2010-02-06 NOTE — Progress Notes (Signed)
Summary: calling to check on pt  Phone Note Outgoing Call Call back at cell - [phone]   Call placed by: Dawson Bills,  June 07, 2006 10:16 AM Details for Reason: calling to check on pt Summary of Call: no change in back pain - waking him up @ night - +relief with pain med - but it takes a while to work Initial call taken by: Dawson Bills,  June 07, 2006 10:17 AM  Follow-up for Phone Call        please schedule a referral to ortho. Also schedule MRI of the back if needed.  It is okay to go Lorcet 10 mg one p.o. q.i.d. p.r.n., number 60 no refills OV here if the pain gets worse orders entered already Follow-up by: Jose E. Paz MD,  June 07, 2006 3:45 PM  Additional Follow-up for Phone Call Additional follow up Details #1::        discussed with pt - does not want lorcet  Additional Follow-up by: Dawson Bills,  June 07, 2006 5:26 PM  New Problems: BACK PAIN, LUMBAR (ICD-724.2)  New Problems: BACK PAIN, LUMBAR (ICD-724.2)

## 2010-02-06 NOTE — Assessment & Plan Note (Signed)
Summary: f/u blood sugars/alr   Vital Signs:  Patient Profile:   55 Years Old Male Weight:      254.50 pounds Pulse rate:   60 / minute Pulse rhythm:   regular BP sitting:   130 / 86  (left arm) Cuff size:   large  Pt. in pain?   no  Vitals Entered By: Janelle Floor (June 11, 2006 8:11 AM)                Chief Complaint:  fu labs and Type 2 diabetes mellitus follow-up.  History of Present Illness:  Type 2 Diabetes Mellitus Follow-Up      This is a 55 year old man who presents for Type 2 diabetes mellitus follow-up.  FBS 102->300(last week);unable to exercise (see5/19/08 eval for LBP,paresthesias into L thigh; to have MRI as ordered by Dr Cobb,Chiropracter;denies steroid Rx);pc glucoses not checked.No bread ,soda, sugar.Due for Ophth eval.  The patient reports numbness of extremities, but denies polyuria, polydipsia, blurred vision, self managed hypoglycemia, hypoglycemia requiring help, weight loss, and weight gain.  The patient denies the following symptoms: neuropathic pain, chest pain, vomiting, orthostatic symptoms, poor wound healing, intermittent claudication, vision loss, and foot ulcer.  Since the last visit the patient reports not exercising regularly.  The patient has been measuring capillary blood glucose before breakfast and after breakfast.    Current Allergies: No known allergies   Past Medical History:    Hyperlipidemia    Diabetes mellitus, type II    Risk Factors:  Tobacco use:  quit    Year quit:  1975 Alcohol use:  no Exercise:  no   Review of Systems  General      Denies chills, fatigue, fever, loss of appetite, malaise, sleep disorder, sweats, weakness, and weight loss.  CV      Complains of shortness of breath with exertion.      Denies bluish discoloration of lips or nails, chest pain or discomfort, difficulty breathing at night, difficulty breathing while lying down, fainting, fatigue, leg cramps with exertion, lightheadness, near  fainting, palpitations, swelling of feet, swelling of hands, and weight gain.      DOE with moving & lifting heavy furniture,not walking  Resp      Denies chest discomfort, chest pain with inspiration, cough, coughing up blood, excessive snoring, hypersomnolence, morning headaches, pleuritic, shortness of breath, sputum productive, and wheezing.  GI      Complains of indigestion.      Denies abdominal pain, bloody stools, change in bowel habits, constipation, dark tarry stools, diarrhea, excessive appetite, gas, hemorrhoids, loss of appetite, nausea, vomiting, vomiting blood, and yellowish skin color.      Aciphex controls ERD;LFTs high3/08  Derm      Complains of rash.      Denies changes in color of skin, changes in nail beds, dryness, excessive perspiration, flushing, hair loss, insect bite(s), itching, lesion(s), and poor wound healing.      R neck X 2weeks  Neuro      Complains of numbness, tingling, and weakness.      in LLE,more costant  Endo      Complains of heat intolerance.      Denies cold intolerance, excessive hunger, excessive thirst, excessive urination, polyuria, and weight change.   Physical Exam  General:     Well-developed,well-nourished,in no acute distress; alert,appropriate and cooperative throughout examination Eyes:     no icterus Neck:     No deformities, masses, or tenderness noted. Lungs:  Normal respiratory effort, chest expands symmetrically. Lungs are clear to auscultation, no crackles or wheezes. Heart:     Normal rate and regular rhythm. S1 and S2 normal without gallop, murmur, click, rub or other extra sounds. Abdomen:     Bowel sounds positive,abdomen soft and non-tender without masses, organomegaly or hernias noted. Msk:     No deformity or scoliosis noted of thoracic or lumbar spine.   Pulses:     R and L carotid,radial,femoral,dorsalis pedis and posterior tibial pulses are full and equal bilaterally Extremities:     No clubbing,  cyanosis, edema, or deformity noted with normal full range of motion of all joints.   Neurologic:     alert & oriented X3 and strength normal in all extremities. Neg SLR,tip toe/heel walk WNL DTRs 1/2 Skin:     tinea versicolor R neck Cervical Nodes:     No lymphadenopathy noted Axillary Nodes:     No palpable lymphadenopathy    Impression & Recommendations:  Problem # 1:  DIABETES MELLITUS, TYPE II (ICD-250.00)  His updated medication list for this problem includes:    Actos 30 Mg Tabs (Pioglitazone hcl) .Marland Kitchen... 1 by mouth qd    Glimepiride 4 Mg Tabs (Glimepiride) .Marland Kitchen... 1 by mouth qd    Lotrel 10-20 Mg Caps (Amlodipine besy-benazepril hcl) .Marland Kitchen... 1 by mouth qd    Aspirin 81 Mg Tbec (Aspirin) .Marland Kitchen... 1 by mouth qd  Orders: TLB-Microalbumin/Creat Ratio, Urine (82043-MALB)   Problem # 2:  BACK PAIN, LUMBAR, WITH RADICULOPATHY (ICD-724.4)  His updated medication list for this problem includes:    Aspirin 81 Mg Tbec (Aspirin) .Marland Kitchen... 1 by mouth qd   Problem # 3:  TINEA VERSICOLOR (ICD-111.0)  Problem # 4:  HYPERLIPIDEMIA (B2193296.4)  His updated medication list for this problem includes:    Crestor 20 Mg Tabs (Rosuvastatin calcium) .Marland Kitchen... 1 by mouth q od  Orders: TLB-BMP (Basic Metabolic Panel-BMET) (99991111) TLB-Lipid Panel (80061-LIPID)   Problem # 5:  ANEMIA NEC (ICD-285.8)  Orders: TLB-CBC Platelet - w/Differential (85025-CBCD)   Medications Added to Medication List This Visit: 1)  Actos 30 Mg Tabs (Pioglitazone hcl) .Marland Kitchen.. 1 by mouth qd 2)  Glimepiride 4 Mg Tabs (Glimepiride) .Marland Kitchen.. 1 by mouth qd 3)  Crestor 20 Mg Tabs (Rosuvastatin calcium) .Marland Kitchen.. 1 by mouth qwed 4)  Lotrel 10-20 Mg Caps (Amlodipine besy-benazepril hcl) .Marland Kitchen.. 1 by mouth qd 5)  Allopurinol 300 Mg Tabs (Allopurinol) .Marland Kitchen.. 1 by mouth qd 6)  Aspirin 81 Mg Tbec (Aspirin) .Marland Kitchen.. 1 by mouth qd 7)  Fish Oil 1000 Mg Caps (Omega-3 fatty acids) .Marland Kitchen.. 1 by mouth qd  Other Orders: TLB-A1C / Hgb A1C  (Glycohemoglobin) (83036-A1C) TLB-Hepatic/Liver Function Pnl (80076-HEPATIC)   Patient Instructions: 1)  complete MRI;request report copy to Dr Omauri Boeve;Selsum Blue shampoo 3X/week to rash overnite then wash off in am

## 2010-02-06 NOTE — Progress Notes (Signed)
Summary: left msg for pt to call//Hop  Phone Note From Pharmacy   Caller: [place]* Summary of Call: clarification of med crestor is pt taking 1 q wed. Is that correct? That is on pt med list. Per Dr Linna Darner call and ask pt what dosage is he taking of his Crestor.  Left msg for pt to call [facility] holding rx .Verdie Mosher  December 09, 2007 8:38 AM  Initial call taken by: Verdie Mosher,  December 09, 2007 8:38 AM  Follow-up for Phone Call        Spoke with pt who said he takes cresto 1 a day this was changed a year ago  pharmacy informed.Verdie Mosher  December 13, 2007 1:07 PM  Follow-up by: Verdie Mosher,  December 13, 2007 1:07 PM    New/Updated Medications: CRESTOR 20 MG TABS (ROSUVASTATIN CALCIUM) 1 by mouth qd

## 2010-02-06 NOTE — Progress Notes (Signed)
Summary: FYI--NEEDS APPT IN ORDER TO REFILL ACTOS  Phone Note Call from Patient Message from:  Fax from Pharmacy on October 21, 2009 11:09 AM  Summary of Call: FYI---RECEIVED [place] [phone]) TO REFILL ACTOS---PR NOTE --PATIENT NEEDS APPOINTMENT IN ORDER TO GET REFILL  LEFT MESSAGE FOR HIM TO CALL BACK TO SCHEDULE APPT Initial call taken by: Berneta Sages,  October 21, 2009 11:37 AM  Follow-up for Phone Call        2)  Please schedule a follow-up appointment in 4 months. 3)  BUN,creat, K+, uric acid;: 4)  Lipid Panel; 5)  HbgA1C  Copied from 02/2009 OV, labs were due in June 2011. Follow-up by: Georgette Dover CMA,  October 21, 2009 3:43 PM    Prescriptions: ACTOS 45 MG  TABS (PIOGLITAZONE HCL) 1 by mouth once daily **APPOINTMENT DUE** Brand medically necessary #15 x 0   Entered by:   [place] by:   Unice Cobble MD   Signed by:   Georgette Dover CMA on 10/21/2009   Method used:   Electronically to        [facility] (retail)       [address]       [place], [place]  [address]       Ph: [phone]       Fax: [phone]   RxID:   [id number]

## 2010-02-06 NOTE — Progress Notes (Signed)
Summary: Hop-refill  Phone Note Refill Request   Refills Requested: Medication #1:  GLUCOPHAGE 1000 MG  TABS 1 by mouth two times a day [phone]  Initial call taken by: Velora Heckler,  June 27, 2007 8:28 AM      Prescriptions: GLUCOPHAGE 1000 MG  TABS (METFORMIN HCL) 1 by mouth two times a day  #60 Each x 2   Entered by:   Georgette Dover   Authorized by:   Unice Cobble MD   Signed by:   Georgette Dover on 06/27/2007   Method used:   Electronically sent to ...       [facility]       [place], [place]  [address]       Ph: [phone]       Fax: [phone]   RxID:   [id number]

## 2010-02-06 NOTE — Progress Notes (Signed)
Summary: Lab Results  Phone Note Outgoing Call Call back at Home Phone [phone]   Call placed by: Georgette Dover,  February 05, 2009 3:27 PM Call placed to: Patient Summary of Call: Left message on machine for patient to return call when avaliable, Reason for call:   Lipids are incredibly good ; A1c not done as ordered. Please get A1c here or @ [facility]  & see me before refilling meds. [facility]   Chrae Malloy  February 05, 2009 3:27 PM    Follow-up for Phone Call        Spoke with patient, scheduled appointment for monday for A1c and for friday with Dr.Hopper, copy of labs already mailed Follow-up by: Georgette Dover,  February 07, 2009 9:58 AM

## 2010-02-06 NOTE — Letter (Signed)
Summary: [facility]  [facility]   Imported By: Velora Heckler 01/04/2007 15:31:25  _____________________________________________________________________  External Attachment:    Type:   Image     Comment:   External Document

## 2010-02-06 NOTE — Letter (Signed)
Summary: [facility]  [facility]   Imported By: Edmonia James 09/17/2008 10:25:30  _____________________________________________________________________  External Attachment:    Type:   Image     Comment:   External Document

## 2010-02-06 NOTE — Assessment & Plan Note (Signed)
Summary: FOR STOMACH PAIN//PH   Vital Signs:  Patient profile:   55 year old male Weight:      252.8 pounds BMI:     35.64 Temp:     97.4 degrees F oral Pulse rate:   72 / minute Resp:     15 per minute BP sitting:   124 / 80  (left arm) Cuff size:   large  Vitals Entered By: Georgette Dover CMA (December 09, 2009 11:57 AM) CC: Stomach pain x 2 weeks: diarrhea, burping and gas , Abdominal pain, Heartburn, Type 2 diabetes mellitus follow-up   Primary Care Provider:  Dr. Unice Cobble  CC:  Stomach pain x 2 weeks: diarrhea, burping and gas , Abdominal pain, Heartburn, and Type 2 diabetes mellitus follow-up.  History of Present Illness:      This is a 55 year old man who presents with Abdominal pain; onset 11/26/2009 as bloating & nausea.  The patient reports nausea and melena, but denies vomiting, diarrhea, constipation, hematochezia, anorexia, and hematemesis.  The location of the pain is diffuse across upper abdomen.  The pain is described as intermittent, dull to cramping in quality, and  non  radiating.  Associated symptoms include weight loss of  8# in past 2 weeks .  The patient denies the following symptoms: fever, dysuria, jaundice, and dark urine.  The pain is worse with food.  The pain persists despite  a PPI, Nexium.    The patient reports sour taste in mouth, but denies trouble swallowing.  The patient denies the following alarm features: dysphagia.  Prior evaluation has included EGD.  Sunday am  12/4 he had a black tongue ; he had had severe bloating 12/1 evening.PMH of esophageal dilation 2007.  No alcoho or smoking.                   His FBS vary 83- 109; he has been  followed remotely by Dr Altheimer in past.  His last A1c was in 02/2009.  The patient reports  occasional self managed hypoglycemia, but denies polyuria, polydipsia, and numbness of extremities.  The patient denies the following symptoms: neuropathic pain, orthostatic symptoms, poor wound healing, and foot ulcer.  The  patient has been measuring capillary blood glucose before breakfast daily.  Since the last visit, the patient reports having had eye care by an ophthalmologist and no foot care.    Current Medications (verified): 1)  Aspirin 81 Mg Tbec (Aspirin) .Marland Kitchen.. 1 By Mouth Qd 2)  Crestor 20 Mg Tabs (Rosuvastatin Calcium) .Marland Kitchen.. 1 By Mouth Once Daily Except 1/2 Tues, Th, & Sat 3)  Lotrel 10-20 Mg Caps (Amlodipine Besy-Benazepril Hcl) .Marland Kitchen.. 1 By Mouth Qd 4)  Fish Oil 1000 Mg Caps (Omega-3 Fatty Acids) .Marland Kitchen.. 1 By Mouth Qd 5)  Allopurinol 300 Mg Tabs (Allopurinol) .Marland Kitchen.. 1 By Mouth Qd 6)  Actos 45 Mg  Tabs (Pioglitazone Hcl) .Marland Kitchen.. 1 By Mouth Once Daily **appointment Due** 7)  Glucophage 1000 Mg  Tabs (Metformin Hcl) .... 1/2 Two Times A Day With 2 Largest Meals; Stop Altogether 2 Days Pre Op. **appointment Due** 8)  Byetta 10 Mcg Pen 10 Mcg/0.73ml  Soln (Exenatide) .Marland Kitchen.. 10 Units Once in The Evening 2 Hours After Last Meal 9)  Lyrica 150 Mg Caps (Pregabalin) .Marland Kitchen.. 1 By Mouth Two Times A Day 10)  Vitamin D 50,000 .Marland KitchenMarland Kitchen. 1 A Week 11)  Nexium 40 Mg Cpdr (Esomeprazole Magnesium) .Marland Kitchen.. 1 Once Daily As Needed 12)  Vitamin D 1000  Unit Tabs (Cholecalciferol) .Marland Kitchen.. 1 Once Daily in Addition To 50,000 International Units Once Weekly 13)  Miralax  Pack (Polyethylene Glycol 3350) .... As Directed Per Pack 14)  Freestyle Lancets  Misc (Lancets) .... Check Bloodsugar 1-2 X Daily 15)  Freestyle Test  Strp (Glucose Blood) .... 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  Physical Exam  General:  well-nourished,in no acute distress; alert,appropriate and cooperative throughout examination Eyes:  No corneal or conjunctival inflammation noted. No icterus Mouth:   Oral mucosa and oropharynx without lesions or exudates.  Teeth in good repair; upper & lower partials. No .pharyngeal erythema.   Lungs:  Normal respiratory effort, chest expands symmetrically. Lungs are clear to auscultation, no crackles or wheezes. Heart:  Normal rate and regular rhythm. S1 and S2 normal without gallop, murmur, click, rub . S4 Abdomen:  Bowel sounds positive,abdomen soft but  tender  in epigastriumwithout masses, organomegaly or hernias noted. Skin:  Intact without suspicious lesions or rashes Cervical Nodes:  No lymphadenopathy noted Axillary Nodes:  No palpable lymphadenopathy Psych:  memory intact for recent and remote, normally interactive, and good eye contact.     Impression & Recommendations:  Problem # 1:  ABDOMINAL PAIN (ICD-789.00)  PMH of esophageal stricture  Orders: Venipuncture HR:875720) TLB-CBC Platelet - w/Differential (85025-CBCD) TLB-Hepatic/Liver Function Pnl (80076-HEPATIC) TLB-Amylase (82150-AMYL) TLB-Lipase (83690-LIPASE) Gastroenterology Referral (GI)  Problem # 2:  DIABETES MELLITUS, TYPE II (ICD-250.00)  His updated medication list for this problem includes:    Aspirin 81 Mg Tbec (Aspirin) .Marland Kitchen... 1 by mouth qd    Lotrel 10-20 Mg Caps (Amlodipine besy-benazepril hcl) .Marland Kitchen... 1 by mouth qd    Actos 45 Mg Tabs (Pioglitazone hcl) .Marland Kitchen... 1 by mouth once daily **appointment due**    Glucophage 1000 Mg Tabs (Metformin hcl) .Marland Kitchen... 1/2 two times a day with 2 largest meals; stop altogether 2 days pre op. **appointment due**    Byetta 10 Mcg Pen 10 Mcg/0.93ml Soln (Exenatide) .Marland KitchenMarland KitchenMarland KitchenMarland Kitchen 10 units once in the evening 2 hours after last meal  Orders: Venipuncture HR:875720) TLB-Creatinine, Blood (82565-CREA) TLB-Potassium (K+) (84132-K) TLB-BUN (Urea Nitrogen) (84520-BUN) TLB-A1C / Hgb A1C (Glycohemoglobin) (83036-A1C) TLB-Microalbumin/Creat Ratio, Urine (82043-MALB)  Complete Medication List: 1)  Aspirin 81 Mg Tbec (Aspirin) .Marland Kitchen.. 1 by mouth qd 2)  Crestor 20  Mg Tabs (Rosuvastatin calcium) .Marland Kitchen.. 1 by mouth once daily except 1/2 tues, th, & sat 3)  Lotrel 10-20 Mg Caps (Amlodipine besy-benazepril hcl) .Marland Kitchen.. 1 by mouth qd 4)  Fish Oil 1000 Mg Caps (Omega-3 fatty acids) .Marland Kitchen.. 1 by mouth qd 5)  Allopurinol 300 Mg Tabs (Allopurinol) .Marland Kitchen.. 1 by mouth qd 6)  Actos 45 Mg Tabs (Pioglitazone hcl) .Marland Kitchen.. 1 by mouth once daily **appointment due** 7)  Glucophage 1000 Mg Tabs (Metformin hcl) .... 1/2 two times a day with 2 largest meals; stop altogether 2 days pre op. **appointment due** 8)  Byetta 10 Mcg Pen 10 Mcg/0.90ml Soln (Exenatide) .Marland Kitchen.. 10 units once in the evening 2 hours after last meal 9)  Lyrica 150 Mg Caps (Pregabalin) .Marland Kitchen.. 1 by mouth two times a day 10)  Vitamin D 50,000  .Marland Kitchen.. 1 a week 11)  Nexium 40 Mg Cpdr (Esomeprazole magnesium) .Marland Kitchen.. 1 once daily as needed 12)  Vitamin D 1000 Unit  Tabs (Cholecalciferol) .Marland Kitchen.. 1 once daily in addition to 50,000 international units once weekly 13)  Miralax Pack (Polyethylene glycol 3350) .... As directed per pack 14)  Freestyle Lancets Misc (Lancets) .... Check bloodsugar 1-2 x daily 15)  Freestyle Test Strp (Glucose blood) .... Check bloodsugar 1-2x daily  Patient Instructions: 1)  Hold Metformin until well. Take Nexium 40 two times a day pre meals. Complete stool cards. 2)  Avoid foods high in acid (tomatoes, citrus juices, spicy foods). Avoid eating within two hours of lying down or before exercising. Do not over eat; try smaller more frequent meals. Elevate head of bed twelve inches when sleeping.   Orders Added: 1)  Est. Patient Level IV [id number] 2)  Venipuncture [id number] 3)  TLB-CBC Platelet - w/Differential [85025-CBCD] 4)  TLB-Hepatic/Liver Function Pnl [80076-HEPATIC] 5)  TLB-Creatinine, Blood [82565-CREA] 6)  TLB-Potassium (K+) [84132-K] 7)  TLB-BUN (Urea Nitrogen) [84520-BUN] 8)  TLB-A1C / Hgb A1C (Glycohemoglobin) [83036-A1C] 9)  TLB-Microalbumin/Creat Ratio, Urine [82043-MALB] 10)  TLB-Amylase  [82150-AMYL] 11)  TLB-Lipase [83690-LIPASE] 12)  Gastroenterology Referral [GI]

## 2010-02-06 NOTE — Progress Notes (Signed)
Summary: METFORMIN HCL 1000 MG REFILL  Phone Note Refill Request Message from:  Fax from Pharmacy on October 07, 2009 9:13 AM  Refills Requested: Medication #1:  GLUCOPHAGE 1000 MG  TABS 1/2 two times a day with 2 largest meals; stop altogether 2 days pre op   Last Refilled: 04/09/2009 [facility]  FAX [phone]  ****DIRECTIONS ON FAX STATE:  "TAKE 1 TABLET BY MOUTH TWICE DAILY" ****  Initial call taken by: Berneta Sages,  October 07, 2009 9:14 AM  Follow-up for Phone Call        Left message with patient's daughter to have patient return call when avaliable. Reason for call:   Have patient verify how he is taking medication Follow-up by: [place],  October 07, 2009 9:23 AM  Additional Follow-up for Phone Call Additional follow up Details #1::        Patient called back to say he is taking 1/2 tab daily, patient aware I will forward back to the pharmacy Additional Follow-up by: Georgette Dover CMA,  October 07, 2009 9:34 AM    New/Updated Medications: GLUCOPHAGE 1000 MG  TABS (METFORMIN HCL) 1/2 two times a day with 2 largest meals; stop altogether 2 days pre op. **APPOINTMENT DUE** Prescriptions: GLUCOPHAGE 1000 MG  TABS (METFORMIN HCL) 1/2 two times a day with 2 largest meals; stop altogether 2 days pre op. **APPOINTMENT DUE**  #90 x 0   Entered by:   Georgette Dover CMA   Authorized by:   Unice Cobble MD   Signed by:   Georgette Dover CMA on 10/07/2009   Method used:   Electronically to        [facility] (retail)       [address]       [place],   [address]       Ph: [phone]       Fax: [phone]   RxID:   [id number]

## 2010-02-06 NOTE — Progress Notes (Signed)
Summary: RESULT  Phone Note Call from Patient   Caller: Patient Summary of Call: PT OF DR. PAZ  PT CALLING WANTING MRI RESULT,  CALL HIM [phone] Initial call taken by: Velora Heckler,  June 15, 2006 3:02 PM  Follow-up for Phone Call        pt aware waiting for Dr. Larose Kells to review ..................................................................Marland KitchenDawson Bills  June 15, 2006 4:47 PM   Additional Follow-up for Phone Call Additional follow up Details #1::        abnormal MRI; patient had told me DR Dayton Martes had ordered MRI;actually ordered by DR Larose Kells.Neurosurgical consult indicated Additional Follow-up by: Unice Cobble MD,  June 16, 2006 5:43 PM

## 2010-02-06 NOTE — Progress Notes (Signed)
Summary: Medication Request  Phone Note Call from Patient   Caller: Darlene: Wife Summary of Call: Message left onVM: Patient's wife states her husband is taking all of her Miralax and she would like for [place] to send him a RX to [facility] outpatient pharmacy Initial call taken by: Georgette Dover,  February 05, 2009 12:03 PM    New/Updated Medications: MIRALAX  PACK (POLYETHYLENE GLYCOL 3350) as directed per pack Prescriptions: MIRALAX  PACK (POLYETHYLENE GLYCOL 3350) as directed per pack  #33mo supply x 1   Entered by:   Georgette Dover   Authorized by:   Unice Cobble MD   Signed by:   Georgette Dover on 02/05/2009   Method used:   Electronically to        [facility] (retail)       [address]       [place], [place]  [address]       Ph: [phone]       Fax: [phone]   RxID:   [id number]

## 2010-02-06 NOTE — Progress Notes (Signed)
Summary: RX  Phone Note Refill Request   Refills Requested: Medication #1:  CRESTOR 20 MG TABS 1 by mouth qd [facility]--[phone]  Initial call taken by: Velora Heckler,  January 01, 2009 8:28 AM    New/Updated Medications: CRESTOR 20 MG TABS (ROSUVASTATIN CALCIUM) 1 by mouth once daily, Due for Chlosterol check Prescriptions: CRESTOR 20 MG TABS (ROSUVASTATIN CALCIUM) 1 by mouth once daily, Due for Chlosterol check  #30 x 0   Entered by:   Georgette Dover   Authorized by:   Unice Cobble MD   Signed by:   Georgette Dover on 01/01/2009   Method used:   Electronically to        [facility] (retail)       [address], [place]  [address]       Ph: [phone]       Fax: [phone]   RxID:   [id number]  Due for lipid/hep 272.4, 995.20

## 2010-02-06 NOTE — Assessment & Plan Note (Signed)
Summary: F/U//CA   Vital Signs:  Patient Profile:   55 Years Old Male Weight:      265.25 pounds Pulse rate:   60 / minute Pulse rhythm:   regular BP sitting:   100 / 68  (left arm) Cuff size:   large  Pt. in pain?   yes    Location:   foot    Intensity:   10    Type:       sharp  Vitals Entered By: Janelle Floor (August 26, 2006 4:29 PM)                Chief Complaint:  follow up on diabetes .  History of Present Illness: He is  having episodes of blood sugar dropping suddenly approx 10 am daily X 3-4 weeks since return to work . Works Psychologist, prison and probation services .  Current Allergies (reviewed today): No known allergies  Updated/Current Medications (including changes made in today's visit):  ACTOS 30 MG TABS (PIOGLITAZONE HCL) 1 by mouth two times a day GLIMEPIRIDE 4 MG TABS (GLIMEPIRIDE) 1/2 q am & 1/2 ac eve meal CRESTOR 20 MG TABS (ROSUVASTATIN CALCIUM) 1 by mouth qwed LOTREL 10-20 MG CAPS (AMLODIPINE BESY-BENAZEPRIL HCL) 1 by mouth qd ALLOPURINOL 300 MG TABS (ALLOPURINOL) 1 by mouth qd ASPIRIN 81 MG TBEC (ASPIRIN) 1 by mouth qd FISH OIL 1000 MG CAPS (OMEGA-3 FATTY ACIDS) 1 by mouth qd GLUCOPHAGE 1000 MG  TABS (METFORMIN HCL) 1 by mouth two times a day * PRILOSEC 20MG  OTC  1 PO QD  * TYLENOL 2 TABS PRN        Physical Exam  Msk:     Pes planus, tender to percussion Pulses:     Decreased pedal pulses Skin:     Intact without suspicious lesions or rashes    Impression & Recommendations:  Problem # 1:  HEEL PAIN, RIGHT (ICD-729.5)  Orders: Podiatry Referral (Podiatry)   Problem # 2:  HYPOGLYCEMIA NEC (ICD-251.1)  Orders: Endocrinology Referral (Endocrine)   Problem # 3:  DIABETES-TYPE 2 (ICD-250.00)  The following medications were removed from the medication list:    Actos 15 Mg Tabs (Pioglitazone hcl) .Marland Kitchen... Take one tablet daily  His updated medication list for this problem includes:    Actos 30 Mg Tabs (Pioglitazone hcl) .Marland Kitchen... 1 by  mouth two times a day    Glimepiride 4 Mg Tabs (Glimepiride) .Marland Kitchen... 1/2 q am & 1/2 ac eve meal    Lotrel 10-20 Mg Caps (Amlodipine besy-benazepril hcl) .Marland Kitchen... 1 by mouth qd    Aspirin 81 Mg Tbec (Aspirin) .Marland Kitchen... 1 by mouth qd    Glucophage 1000 Mg Tabs (Metformin hcl) .Marland Kitchen... 1 by mouth two times a day  Orders: Endocrinology Referral (Endocrine)   Complete Medication List: 1)  Actos 30 Mg Tabs (Pioglitazone hcl) .Marland Kitchen.. 1 by mouth two times a day 2)  Glimepiride 4 Mg Tabs (Glimepiride) .... 1/2 q am & 1/2 ac eve meal 3)  Crestor 20 Mg Tabs (Rosuvastatin calcium) .Marland Kitchen.. 1 by mouth qwed 4)  Lotrel 10-20 Mg Caps (Amlodipine besy-benazepril hcl) .Marland Kitchen.. 1 by mouth qd 5)  Allopurinol 300 Mg Tabs (Allopurinol) .Marland Kitchen.. 1 by mouth qd 6)  Aspirin 81 Mg Tbec (Aspirin) .Marland Kitchen.. 1 by mouth qd 7)  Fish Oil 1000 Mg Caps (Omega-3 fatty acids) .Marland Kitchen.. 1 by mouth qd 8)  Glucophage 1000 Mg Tabs (Metformin hcl) .Marland Kitchen.. 1 by mouth two times a day 9)  Prilosec 20mg  Otc 1 Po Qd  10)  Tylenol 2 Tabs Prn    Patient Instructions: 1)  Januvia once daily until seen by Endocrinologist

## 2010-02-06 NOTE — Letter (Signed)
Summary: [facility]  [facility]   Imported By: Edmonia James 07/22/2009 13:17:53  _____________________________________________________________________  External Attachment:    Type:   Image     Comment:   External Document

## 2010-02-06 NOTE — Letter (Signed)
Summary: [facility]   Imported By: Edmonia James 03/22/2009 12:33:49  _____________________________________________________________________  External Attachment:    Type:   Image     Comment:   External Document

## 2010-02-06 NOTE — Medication Information (Signed)
Summary: Adverse Drug Effects with Actos & Lyrica/[facility]  Adverse Drug Effects with Actos & Lyrica/[facility]   Imported By: Edmonia James 09/15/2007 09:08:51  _____________________________________________________________________  External Attachment:    Type:   Image     Comment:   External Document

## 2010-02-06 NOTE — Letter (Signed)
Summary: [facility]  [facility]   Imported By: Edmonia James 07/02/2008 10:19:02  _____________________________________________________________________  External Attachment:    Type:   Image     Comment:   External Document

## 2010-02-06 NOTE — Progress Notes (Signed)
Summary: REFILL REQUEST  Phone Note Refill Request Call back at [phone] Message from:  Pharmacy on August 12, 2009 9:32 AM  Refills Requested: Medication #1:  ALLOPURINOL 300 MG TABS 1 by mouth qd   Dosage confirmed as above?Dosage Confirmed   Supply Requested: 3 months   Last Refilled: 02/15/2009  Medication #2:  LOTREL 10-20 MG CAPS 1 by mouth qd   Dosage confirmed as above?Dosage Confirmed   Supply Requested: 3 months   Last Refilled: 02/15/2009 [facility]  Next Appointment Scheduled: NONE Initial call taken by: Osborn Coho,  August 12, 2009 9:33 AM    Prescriptions: LOTREL 10-20 MG CAPS (AMLODIPINE BESY-BENAZEPRIL HCL) 1 by mouth qd  #90 x 2   Entered by:   [place] by:   Unice Cobble MD   Signed by:   Georgette Dover CMA on 08/12/2009   Method used:   Electronically to        [facility] (retail)       [address], [place]  [address]       Ph: [phone]       Fax: [phone]   RxID:   [id number] ALLOPURINOL 300 MG TABS (ALLOPURINOL) 1 by mouth qd  #90 x 2   Entered by:   Georgette Dover CMA   Authorized by:   Unice Cobble MD   Signed by:   Georgette Dover CMA on 08/12/2009   Method used:   Electronically to        [facility] (retail)       [address]       [place], [place]  [address]       Ph: [phone]       Fax: [phone]   RxID:   [id number]

## 2010-02-06 NOTE — Assessment & Plan Note (Signed)
History of Present Illness Visit Type: Initial Consult Primary GI MD: Owens Loffler MD Primary Provider: Unice Cobble, MD Requesting Provider: Unice Cobble, MD Chief Complaint: GERD History of Present Illness:     very pleasant 55 year old man whom I last saw around the time of upper and lower endoscopies in 2007.  he is here with his wife today. I dilated a Schatzki's ring in his esophagus. Colonoscopy was normal without polyps and he was recommended to have a repeat colonoscopy at 10 year interval.  His swallowing clearly improved.  takes ibuprofen for neck pain, several times a day.  He has had about one month of bloating, belching, epigastric pain, a lot of gas, was constipated.  Took pepto bismol, had black stools.    normally he will have a BM 1-2 times a day, but when symptoms were significant the stools were harder to move.  Was on narcotic pain meds about 3 weeks ago, also prednisone.  was taking nsaids prior to the narcotics.  has been on nexium once daily, was told to double it 1 month ago (still on it twice a day now).  he also stopped metformin and Actos.  He has definitely been improving but still has some dyspeptic symptoms             Current Medications (verified): 1)  Aspirin 81 Mg Tbec (Aspirin) .Marland Kitchen.. 1 By Mouth Qd 2)  Crestor 20 Mg Tabs (Rosuvastatin Calcium) .Marland Kitchen.. 1 By Mouth Once Daily Except 1/2 Tues, Th, & Sat **labs Due 01/2010** 3)  Lotrel 10-20 Mg Caps (Amlodipine Besy-Benazepril Hcl) .Marland Kitchen.. 1 By Mouth Qd 4)  Fish Oil 1000 Mg Caps (Omega-3 Fatty Acids) .Marland Kitchen.. 1 By Mouth Qd 5)  Allopurinol 300 Mg Tabs (Allopurinol) .Marland Kitchen.. 1 By Mouth Qd 6)  Byetta 10 Mcg Pen 10 Mcg/0.42ml  Soln (Exenatide) .Marland Kitchen.. 10 Units Once in The Evening 2 Hours After Last Meal 7)  Lyrica 150 Mg Caps (Pregabalin) .Marland Kitchen.. 1 By Mouth Two Times A Day 8)  Vitamin D 50,000 .Marland KitchenMarland Kitchen. 1 A Week **vit D Check Due 01/2010** 9)  Nexium 40 Mg Cpdr (Esomeprazole Magnesium) .... One Pill Twice A Day 10)   Vitamin D 1000 Unit Tabs (Cholecalciferol) .Marland Kitchen.. 1 Once Daily in Addition To 50,000 International Units Once Weekly 11)  Miralax  Pack (Polyethylene Glycol 3350) .... As Directed Per Pack 12)  Freestyle Lancets  Misc (Lancets) .... Check Bloodsugar 1-2 X Daily 13)  Freestyle Test  Strp (Glucose Blood) .... Check Bloodsugar 1-2x Daily  Allergies (verified): No Known Drug Allergies  Past History:  Past Medical History: Hyperlipidemia Diabetes mellitus, type II Proteinuria Hepatic Steatosis Gout Benign prostatic hypertrophy Hypertension Lumbar spine degenerative joint disease OSA, AHI 33 on PSG from 03/08/03 morbid obesity Schatzki's ring dilated 2007 by Dr. Ardis Hughs Routine risk for colon cancer, last colonoscopy 2007; next colonoscopy at 10 year interval  Past Surgical History: AMPUTATION OF LEFT INDEX [id number]);  Family History: Father: hepatic disease, renal calculi Mother: cns aneurysm age 69 Siblings: sister DM; M uncle CA;P uncles CA ? primary no colon cancer  Social History: Married Alcohol use-no he is unemployed, he has 9 children, he quit smoking,  Review of Systems       Pertinent positive and negative review of systems were noted in the above HPI and GI specific review of systems.  All other review of systems was otherwise negative.   Vital Signs:  Patient profile:   55 year old male Height:  70.75 inches Weight:      254.38 pounds BMI:     35.86 Pulse rate:   68 / minute Pulse rhythm:   regular BP sitting:   104 / 66  (left arm) Cuff size:   large  Vitals Entered By: June McMurray CMA Deborra Medina) (January 21, 2010 9:03 AM)  Physical Exam  Additional Exam:  Constitutional: generally well appearing, Wearing a neck brace Psychiatric: alert and oriented times 3 Eyes: extraocular movements intact Mouth: oropharynx moist, no lesions Neck: supple, no lymphadenopathy Cardiovascular: heart regular rate and rythm Lungs: CTA bilaterally Abdomen: soft,  non-tender, non-distended, no obvious ascites, no peritoneal signs, normal bowel sounds Extremities: no lower extremity edema bilaterally Skin: no lesions on visible extremities    Impression & Recommendations:  Problem # 1:  dyspeptic symptoms, change in bowels I suspect the majority of his symptoms were related to recent NSAID use, narcotic pain medicine use, steroids that had been all needed for worsening neck pains. Metformin may have contributed as well.  I recommended he continue on his proton pump inhibitor twice daily for now and also continue MiraLax daily. I explained to him that Tylenol is probably the safest medicine for his stomach, however he should be careful never to take more than the label recommends. I explained to him that NSAIDs and steroids can have dramatic effect on the stomach when used in combination. He probably had some gastritis, perhaps developing ulcer. He is slowly feeling better and he will continue with these recommendations and return to see me in 6-8 weeks and  Patient Instructions: 1)  Stay on once daily miralax (you may need to increase to two scoops a day if you go back on narcotic pain meds). 2)  Stay on nexium twice a day for another month, then back down to once a day. 3)  Try to avoid too many NSAIDs, steroids at the same time. 4)  No need for EGD at this point. 5)  The safest OTC pain medicine for your stomach is tylenol. 6)  A copy of this information will be sent to Dr. Linna Darner. 7)  Return to see Dr. Ardis Hughs in 6-8 weeks. 8)  The medication list was reviewed and reconciled.  All changed / newly prescribed medications were explained.  A complete medication list was provided to the patient / caregiver.

## 2010-02-06 NOTE — Progress Notes (Signed)
Summary: Clarify Instructions on Byetta  Phone Note From Pharmacy   Caller: [place] Summary of Call: Message left on Voicemail: Please call to discuss Byetta instructions   I called the pharmacy back and informed them of Dr.Hopper's instructions 10units once in the evening 2 hours after last meal. If pharmacy with additional questions call back requested./[facility] CMA  November 11, 2009 4:26 PM     Follow-up for Phone Call        I held message to see if pharmacy would call back with any questions. No call as of right now Follow-up by: Georgette Dover CMA,  November 12, 2009 1:26 PM

## 2010-02-06 NOTE — Letter (Signed)
Summary: [facility]  [facility]   Imported By: Edmonia James 04/29/2009 11:44:19  _____________________________________________________________________  External Attachment:    Type:   Image     Comment:   External Document

## 2010-02-06 NOTE — Letter (Signed)
Summary: Primary Care Appointment Letter  [facility]   [place], [place] [address]   Phone: [phone]  Fax: [phone]    01/23/2009 MRN: [id number]  [place], [place]  [address]  Dear Mr. MEERSMAN,   Your Primary Care Physician Unice Cobble MD has indicated that:    _______it is time to schedule an appointment.    _______you missed your appointment on______ and need to call and          reschedule.    __X_____you need to have lab work done.    _______you need to schedule an appointment discuss lab or test results.    _______you need to call to reschedule your appointment that is                       scheduled on _________.     Please call our office as soon as possible. Our phone number is [phone]. Please press option 1. Our office is open 8a-12noon and 1p-5p, Monday through Friday.     Thank you,    Jennette

## 2010-02-06 NOTE — Medication Information (Signed)
Summary: Letter Regarding Actos & Lyrica/[facility]  Letter Regarding Actos & Lyrica/[facility]   Imported By: Edmonia James 03/18/2009 09:49:12  _____________________________________________________________________  External Attachment:    Type:   Image     Comment:   External Document

## 2010-02-06 NOTE — Letter (Signed)
Summary: External Correspondence  External Correspondence   Imported By: Velora Heckler 05/03/2007 15:04:06  _____________________________________________________________________  External Attachment:    Type:   Image     Comment:   External Document

## 2010-02-06 NOTE — Medication Information (Signed)
Summary: Adverse Effects of Actos w/Lyrica/[facility]  Adverse Effects of Actos w/Lyrica/[facility]   Imported By: Edmonia James 07/10/2008 09:58:50  _____________________________________________________________________  External Attachment:    Type:   Image     Comment:   External Document

## 2010-02-06 NOTE — Progress Notes (Signed)
Summary: refill  Phone Note Refill Request   Refills Requested: Medication #1:  ACTOS 45 MG  TABS 1 by mouth qd [facility]  Initial call taken by: Larene Pickett,  November 28, 2007 1:30 PM      Prescriptions: ACTOS 45 MG  TABS (PIOGLITAZONE HCL) 1 by mouth qd  #30 x 0   Entered by:   Verdie Mosher   Authorized by:   Unice Cobble MD   Signed by:   Verdie Mosher on 11/28/2007   Method used:   Faxed to ...       [facility] (retail)       [address]       [place], [place]  [address]       Ph: [phone]       Fax: [phone]   RxID:   [id number]

## 2010-02-06 NOTE — Progress Notes (Signed)
Summary: Refill Request  Phone Note Refill Request Message from:  Pharmacy on [place] Fax #: [phone]  Refills Requested: Medication #1:  VITAMIN D 50.000 UNIT TABS 1 once weekly   Dosage confirmed as above?Dosage Confirmed   Supply Requested: 12   Last Refilled: 12/31/2008 Next Appointment Scheduled: none Initial call taken by: Elna Breslow,  April 09, 2009 10:28 AM    Prescriptions: VITAMIN D 50,000 1 a week  #12 x 2   Entered by:   Georgette Dover   Authorized by:   Unice Cobble MD   Signed by:   Georgette Dover on 04/09/2009   Method used:   Faxed to ...       [facility] (retail)       [address]       [place], [place]  [address]       Ph: [phone]       Fax: [phone]   RxID:   [id number]

## 2010-02-06 NOTE — Assessment & Plan Note (Signed)
Summary: discuss mri/swh   Vital Signs:  Patient Profile:   55 Years Old Male Weight:      251.8 pounds Pulse rate:   80 / minute Pulse rhythm:   regular BP sitting:   112 / 80  (left arm) Cuff size:   large  Vitals Entered By: Dawson Bills (June 17, 2006 10:46 AM)               Chief Complaint:  discuss MRI results.  History of Present Illness: 50-month history of low back pain.  Initially on and off now more constant. Associated with this pain is left leg numbness described as "the whole leg gets numb" also some pain radiates to the left leg at times the pain extends up to the ankle  Current Allergies: No known allergies      Review of Systems       denies any fever or chills. Denies any bladder or bowel incontinence he has a physical job.   Physical Exam  General:     alert and well-developed.   Neurologic:     gait is antalgic. strength normal in all extremities and DTRs symmetrical and normal.   negative straight leg test    Impression & Recommendations:  Problem # 1:  BACK PAIN (ICD-724.5) back pain with radiculopathy features. Referred to surgery. Vicodin as needed. if he makes him sleepy he is to take it only at night. Flexeril at night. If needed , he will call and get a note for work.  His updated medication list for this problem includes:    Aspirin 81 Mg Tbec (Aspirin) .Marland Kitchen... 1 by mouth qd    Vicodin 5-500 Mg Tabs (Hydrocodone-acetaminophen) .Marland Kitchen... 1 by mouth q4-6 hours as needed pain    Flexeril 10 Mg Tabs (Cyclobenzaprine hcl) ..... One p.o. nightly p.r.n. pain   Medications Added to Medication List This Visit: 1)  Actos 30 Mg Tabs (Pioglitazone hcl) .Marland Kitchen.. 1 by mouth two times a day 2)  Glimepiride 4 Mg Tabs (Glimepiride) .Marland Kitchen.. 1 by mouth two times a day 3)  Glucophage 1000 Mg Tabs (Metformin hcl) .Marland Kitchen.. 1 by mouth two times a day 4)  Vicodin 5-500 Mg Tabs (Hydrocodone-acetaminophen) .Marland Kitchen.. 1 by mouth q4-6 hours as needed pain 5)  Flexeril  10 Mg Tabs (Cyclobenzaprine hcl) .... One p.o. nightly p.r.n. pain   Patient Instructions: 1)  will refer you to one of the surgeons in town. if you don't hear from [place] about these referral in 10 days, please call this office 2)  Flexeril is a muscle relaxer. Take it at bedtime. 3)  Use hydrocodone as needed. If it make you sleepy , use it only at bedtime. 4)  for daytime  pain you can use over the counter Tylenol Prescriptions: FLEXERIL 10 MG  TABS (CYCLOBENZAPRINE HCL) one p.o. nightly p.r.n. pain  #30 x 0   Entered and Authorized by:   Alda Berthold. Daran Favaro MD   Signed by:   Alda Berthold. Elantra Caprara MD on 06/17/2006   Method used:   Print then Give to Patient   RxID:   [id number]   Appended Document: discuss mri/swh MRI results d/w patient. JP

## 2010-02-06 NOTE — Medication Information (Signed)
Summary: [facility]   Imported By: Velora Heckler 06/30/2006 10:43:30  _____________________________________________________________________  External Attachment:    Type:   Image     Comment:   RX

## 2010-02-06 NOTE — Progress Notes (Signed)
Summary: [facility]----rx change med  Phone Note Refill Request   Refills Requested: Medication #1:  Nexium requesting a 3 month supply. This medication is Free for [employer]. Please call of fax in a new rx.Zacarias Pontes [id number]  Initial call taken by: Velora Heckler,  March 13, 2008 12:29 PM  Follow-up for Phone Call        dr Ahleah Simko please advise on this med change..............Marland KitchenFelecia Deloach CMA  March 14, 2008 10:45 AM  Additional Follow-up for Phone Call Additional follow up Details #1::        Rx added & prilosec removed Additional Follow-up by: Unice Cobble,  March 14, 2008 1:36 PM    New/Updated Medications: NEXIUM 40 MG CPDR (ESOMEPRAZOLE MAGNESIUM) 1 once daily as needed   Prescriptions: NEXIUM 40 MG CPDR (ESOMEPRAZOLE MAGNESIUM) 1 once daily as needed  #30 x 5   Entered by:   Rolla Flatten CMA   Authorized by:   Unice Cobble   Signed by:   Rolla Flatten CMA on 03/14/2008   Method used:   Faxed to ...       [facility] (retail)       [address]       [place], [place]  [address]       Ph: [phone]       Fax: [phone]   RxID:   [id number]

## 2010-02-06 NOTE — Consult Note (Signed)
Summary: [facility]  [facility]   Imported By: Edmonia James 06/12/2008 10:11:38  _____________________________________________________________________  External Attachment:    Type:   Image     Comment:   External Document

## 2010-02-06 NOTE — Consult Note (Signed)
Summary: [facility]  [facility]   Imported By: Edmonia James 08/31/2007 09:19:24  _____________________________________________________________________  External Attachment:    Type:   Image     Comment:   External Document

## 2010-02-06 NOTE — Progress Notes (Signed)
Summary: REFILL REQUEST  Phone Note Refill Request Call back at [phone] Message from:  Pharmacy on July 01, 2009 3:53 PM  Refills Requested: Medication #1:  NEXIUM 40 MG CPDR 1 once daily as needed   Dosage confirmed as above?Dosage Confirmed   Supply Requested: 3 months   Last Refilled: 04/09/2009 [facility]  Next Appointment Scheduled: NONE Initial call taken by: Osborn Coho,  July 01, 2009 3:53 PM    Prescriptions: NEXIUM 40 MG CPDR (ESOMEPRAZOLE MAGNESIUM) 1 once daily as needed  #90 x 1   Entered by:   Georgette Dover   Authorized by:   Unice Cobble MD   Signed by:   Georgette Dover on 07/01/2009   Method used:   Electronically to        [facility] (retail)       [address]       [place], [place]  [address]       Ph: [phone]       Fax: [phone]   RxID:   [id number]

## 2010-02-06 NOTE — Letter (Signed)
Summary: [facility]  [facility]   Imported By: Edmonia James 06/22/2009 11:17:08  _____________________________________________________________________  External Attachment:    Type:   Image     Comment:   External Document

## 2010-02-06 NOTE — Letter (Signed)
Summary: Results Follow up Letter  [name]   [place], [place] [address]   Phone: [phone]  Fax: [phone]    12/17/2009 [name]  [name], [name]  [address]  Dear Mr. SCHRAGE,  The following are the results of your recent test(s):  Test         Result    Pap Smear:        Normal _____  Not Normal _____ Comments: ______________________________________________________ Cholesterol: LDL(Bad cholesterol):         Your goal is less than:         HDL (Good cholesterol):       Your goal is more than: Comments:  ______________________________________________________ Mammogram:        Normal _____  Not Normal _____ Comments:  ___________________________________________________________________ Hemoccult:        Normal _x____  Not normal _______ Comments:    _____________________________________________________________________ Other Tests:    We routinely do not discuss normal results over the telephone.  If you desire a copy of the results, or you have any questions about this information we can discuss them at your next office visit.   Sincerely,

## 2010-02-06 NOTE — Progress Notes (Signed)
Summary: RECENT LABS   Phone Note Outgoing Call Call back at [facility] Phone [phone]   Caller: Patient Call placed by: [name],  August 07, 2008 1:56 PM Call placed to: Patient Summary of Call: Left message on machine for patient to return call when avaliable, Reason for call:  Anemia has improved; Hematocrit was 35.7 in 4/10. Stool cards were negative.Diabetes is well controlled . Kidney impairment present;creatinine significantly elevated. Please stay well hydrated , drinking up to 40 oz water / day. Please ask Dr Rita Ohara to recheck kidney function while in hospital. [facility]  Your creatinine was 1.5 previously; please decrease Metformin to 1000 mg  1/2 two times a day with 2 largest meals & drink water as mentioned. Metformin will need to be stopped completely 48 hrs pre op.Kidney function should be rechecked 1-2 days  pre op.  [name]  August 07, 2008 1:56 PM   Follow-up for Phone Call        D/W PATIENT, PATIENT OK'D ALL INFORMATION Follow-up by: [name],  August 08, 2008 10:51 AM

## 2010-02-06 NOTE — Letter (Signed)
Summary: [facility]   Imported By: Edmonia James 01/17/2010 12:21:20  _____________________________________________________________________  External Attachment:    Type:   Image     Comment:   External Document

## 2010-02-06 NOTE — Assessment & Plan Note (Signed)
Summary: acute only for chest congestion and cough--ph   Vital Signs:  Patient profile:   55 year old male Weight:      266 pounds Temp:     98.0 degrees F oral Pulse rate:   64 / minute Resp:     16 per minute BP sitting:   116 / 78  (left arm) Cuff size:   large  Vitals Entered By: Georgette Dover (March 20, 2008 12:25 PM) Comments Cough and chest congestion since Friday   Primary Care Provider:  Dr. Unice Cobble   History of Present Illness: Onset 03/16/2008 as ST then chest congestion 03/13. Rx: Daytime Cough & Congestion. Major symptom now = NP cough. Non smoker;no PMH of asthma. FBS 110-120+.  Allergies (verified): No Known Drug Allergies  Review of Systems General:  Denies chills, fever, and sweats. ENT:  Complains of nasal congestion and sinus pressure; Frontal headache & facial pain as of today; no purulence. Resp:  Complains of shortness of breath; denies wheezing. Allergy:  Complains of sneezing; denies itching eyes.  Physical Exam  General:  Well-developed,well-nourished,in no acute distress; alert,appropriate and cooperative throughout examination Ears:  External ear exam shows no significant lesions or deformities.  Otoscopic examination reveals clear canals, tympanic membranes are intact bilaterally without bulging, retraction, inflammation or discharge. Hearing is grossly normal bilaterally. Nose:  External nasal examination shows no deformity or inflammation. Nasal mucosa are dry & erythematous without lesions or exudates. Mouth:  Oral mucosa and oropharynx without lesions or exudates.   Mild pharyngeal erythema.   Lungs:  Normal respiratory effort, chest expands symmetrically. Lungs are clear to auscultation, no crackles or wheezes. Decreased BS w/o increased WOB Cervical Nodes:  No lymphadenopathy noted Axillary Nodes:  No palpable lymphadenopathy   Impression & Recommendations:  Problem # 1:  BRONCHITIS-ACUTE (ICD-466.0)  His updated medication list  for this problem includes:    Clarithromycin 500 Mg Xr24h-tab (Clarithromycin) .Marland Kitchen... 2 once daily with food    Promethazine-codeine 6.25-10 Mg/50ml Syrp (Promethazine-codeine) .Marland Kitchen... 1 tsp q 6 hrs as needed cough  Problem # 2:  URI (ICD-465.9)  His updated medication list for this problem includes:    Aspirin 81 Mg Tbec (Aspirin) .Marland Kitchen... 1 by mouth qd    Promethazine-codeine 6.25-10 Mg/30ml Syrp (Promethazine-codeine) .Marland Kitchen... 1 tsp q 6 hrs as needed cough  Complete Medication List: 1)  Aspirin 81 Mg Tbec (Aspirin) .Marland Kitchen.. 1 by mouth qd 2)  Crestor 20 Mg Tabs (Rosuvastatin calcium) .Marland Kitchen.. 1 by mouth qd 3)  Lotrel 10-20 Mg Caps (Amlodipine besy-benazepril hcl) .Marland Kitchen.. 1 by mouth qd 4)  Fish Oil 1000 Mg Caps (Omega-3 fatty acids) .Marland Kitchen.. 1 by mouth qd 5)  Allopurinol 300 Mg Tabs (Allopurinol) .Marland Kitchen.. 1 by mouth qd 6)  Actos 45 Mg Tabs (Pioglitazone hcl) .Marland Kitchen.. 1 by mouth qd 7)  Glucophage 1000 Mg Tabs (Metformin hcl) .Marland Kitchen.. 1 by mouth two times a day-needs ov and labs 8)  Byetta 10 Mcg Pen 10 Mcg/0.70ml Soln (Exenatide) 9)  Lyrica 150 Mg Caps (Pregabalin) .Marland Kitchen.. 1 by mouth two times a day 10)  Tramadol Hcl 50 Mg Tabs (Tramadol hcl) .Marland Kitchen.. 1-2 q 6-8 hrs as needed pain 11)  Tylenol 2 Tabs Prn  12)  Vitamin D 50,000  .Marland Kitchen.. 1 a week 13)  Nexium 40 Mg Cpdr (Esomeprazole magnesium) .Marland Kitchen.. 1 once daily as needed 14)  Clarithromycin 500 Mg Xr24h-tab (Clarithromycin) .... 2 once daily with food 15)  Promethazine-codeine 6.25-10 Mg/75ml Syrp (Promethazine-codeine) .Marland Kitchen.. 1 tsp q 6 hrs  as needed cough  Patient Instructions: 1)  Use a Neti pot once daily if having any head congestion. 2)  Drink as much fluid as you can tolerate for the next few days. Prescriptions: PROMETHAZINE-CODEINE 6.25-10 MG/5ML SYRP (PROMETHAZINE-CODEINE) 1 tsp q 6 hrs as needed cough  #120cc x 0   Entered and Authorized by:   Unice Cobble   Signed by:   Unice Cobble on 03/20/2008   Method used:   Print then Give to Patient   RxID:   [id number]  CLARITHROMYCIN 500 MG XR24H-TAB (CLARITHROMYCIN) 2 once daily with food  #20 x 0   Entered and Authorized by:   Unice Cobble   Signed by:   Unice Cobble on 03/20/2008   Method used:   Print then Give to Patient   RxID:   [id number]

## 2010-02-06 NOTE — Letter (Signed)
Summary: External Correspondence-FOLLOW UP FROM [facility]  External Correspondence-FOLLOW UP FROM [facility]   Imported By: Velora Heckler 07/22/2006 16:02:34  _____________________________________________________________________  External Attachment:    Type:   Image     Comment:   FOLLOW UP

## 2010-02-06 NOTE — Letter (Signed)
Summary: [facility]  [facility]   Imported By: Edmonia James 10/12/2008 10:54:08  _____________________________________________________________________  External Attachment:    Type:   Image     Comment:   External Document

## 2010-02-06 NOTE — Procedures (Signed)
Summary: Colon  Patient Name: [name], Rauschenberger MRN:  Procedure Procedures: Colonoscopy CPT: H7044205.  Personnel: Endoscopist: Milus Banister, MD.  Referred By: Darrick Penna. Linna Darner, MD.  Exam Location: Exam performed in Endoscopy Suite. Outpatient  Patient Consent: Procedure, Alternatives, Risks and Benefits discussed, consent obtained, from patient. Consent was obtained by the RN.  Indications  Average Risk Screening Routine.  History  Current Medications: Patient is not currently taking Coumadin.  Comments: Patient history reviewed and updated, pre-procedure physical performed prior to initiation of sedation? yes Pre-Exam Physical: Performed Aug 28, 2005. Cardio-pulmonary exam, Abdominal exam, Mental status exam WNL.  Exam Exam: Extent of exam reached: Cecum, extent intended: Cecum.  The cecum was identified by appendiceal orifice and IC valve. Patient position: left side to back. Time for Withdrawl: 00:08. Colon retroflexion performed. Images taken. ASA Classification: II. Tolerance: good.  Monitoring: Pulse and BP monitoring, Oximetry used. Supplemental O2 given.  Colon Prep Prep results: good.  Sedation Meds: Patient assessed and found to be appropriate for moderate (conscious) sedation. Fentanyl 75 mcg. given IV. Versed 8 mg. given IV.  Findings - NORMAL EXAM: Cecum to Rectum.   Assessment Normal examination.  Comments: No colon polyps or cancers. Events  Unplanned Interventions: No intervention was required.  Unplanned Events: There were no complications. Plans Comments: He should continue to follow colorectal cancer screening guidelines for "routine risk" patients with a colonoscopy in 10 years. Comments: colonoscopy in 10 years.

## 2010-02-06 NOTE — Progress Notes (Signed)
Summary: [facility]--refill  Phone Note Refill Request   Refills Requested: Medication #1:  Metformin hcl 1.000 mg tab [name]ph- [phone]  Initial call taken by: Velora Heckler,  November 07, 2007 3:06 PM    New/Updated Medications: GLUCOPHAGE 1000 MG  TABS (METFORMIN HCL) 1 by mouth two times a day-NEEDS OV AND LABS   Prescriptions: GLUCOPHAGE 1000 MG  TABS (METFORMIN HCL) 1 by mouth two times a day-NEEDS OV AND LABS  #60 x 0   Entered by:   Malachi Bonds   Authorized by:   Unice Cobble MD   Signed by:   Malachi Bonds on 11/07/2007   Method used:   Electronically to        [facility] (retail)       [address]       [place], [place]  [address]       Ph: [phone]       Fax: [phone]   RxID:   [id number]

## 2010-02-06 NOTE — Progress Notes (Signed)
Summary: rf metformin  Phone Note Refill Request Message from:  Fax from Pharmacy on February 21, 2007 1:32 PM  Refills Requested: Medication #1:  GLUCOPHAGE 1000 MG  TABS 1 by mouth two times a day  Refill request from [facility] fax [phone] phone - [phone]  Initial call taken by: [facility] Bills,  February 21, 2007 1:34 PM      Prescriptions: GLUCOPHAGE 1000 MG  TABS (METFORMIN HCL) 1 by mouth two times a day  #60 Each x 3   Entered by:   Janelle Floor   Authorized by:   Unice Cobble MD   Signed by:   Janelle Floor on 02/21/2007   Method used:   Telephoned to ...         [id number]  called to the pharmacy....................................................................Marland KitchenJanelle Floor  February 21, 2007 4:17 PM

## 2010-02-06 NOTE — Letter (Signed)
Summary: [facility]  [facility]   Imported By: Velora Heckler 01/14/2007 10:39:41  _____________________________________________________________________  External Attachment:    Type:   Image     Comment:   External Document

## 2010-02-06 NOTE — Progress Notes (Signed)
Summary: NEEDS LAB CODES WITH DIAG BANK  Phone Note Call from Patient   Caller: Patient Summary of Call: PATIENT CALLED TO GET FASTING LABS APPOINTMENT--HE IS COMING IN ON WED JAN 26 AT 9AM--PLEASE TELL ME WHAT FASTING LABS TO PUT IN AND PLEASE GIVE [place] Initial call taken by: Berneta Sages,  January 24, 2009 4:35 PM  Follow-up for Phone Call        a1c 250.00,lipid,hepatic 272.4, vitamin d 268.9............Marland KitchenFelecia Deloach CMA  January 25, 2009 8:39 AM   Additional Follow-up for Phone Call Additional follow up Details #1::        ADDED INFO TO LAB APPOINTMENT Additional Follow-up by: Berneta Sages,  January 29, 2009 11:22 AM

## 2010-02-06 NOTE — Progress Notes (Signed)
  Phone Note Call from Patient   Caller: Spouse Call For: Sharyn Lull Reason for Call: Talk to Nurse, Lab or Test Results Action Taken: Phone Call Completed Summary of Call: Pt. spouse aware that labs and rx mailed to home address today. Initial call taken by: Carley Hammed,  June 14, 2006 11:26 AM

## 2010-02-06 NOTE — Letter (Signed)
Summary: [facility]  [facility]   Imported By: Edmonia James 09/25/2009 09:19:05  _____________________________________________________________________  External Attachment:    Type:   Image     Comment:   External Document

## 2010-02-06 NOTE — Letter (Signed)
Summary: External Correspondence--CONSULT  External Correspondence--CONSULT   Imported By: Velora Heckler 07/16/2006 10:37:23  _____________________________________________________________________  External Attachment:    Type:   Image     Comment:   External Document

## 2010-02-06 NOTE — Procedures (Signed)
Summary: EGD  Patient Name: [name], Melley MRN:  Procedure Procedures: Panendoscopy (EGD) CPT: T1461772.    with balloon dilation. CPT: Y3133983.  Personnel: Endoscopist: Milus Banister, MD.  Referred By: Darrick Penna. Linna Darner, MD.  Exam Location: Exam performed in Endoscopy Suite. Outpatient  Patient Consent: Procedure, Alternatives, Risks and Benefits discussed, consent obtained, from patient. Consent was obtained by the RN.  Indications Symptoms: Dysphagia. Reflux symptoms  History  Current Medications: Patient is not currently taking Coumadin.  Comments: Patient history reviewed and updated, pre-procedure physical performed prior to initiation of sedation? yes Pre-Exam Physical: Performed Aug 28, 2005  Cardio-pulmonary exam, Abdominal exam, Mental status exam WNL.  Exam Exam Info: Maximum depth of insertion Duodenum, intended Duodenum. Patient position: on left side. Gastric retroflexion performed. Images taken. ASA Classification: II. Tolerance: good.  Sedation Meds: Patient assessed and found to be appropriate for moderate (conscious) sedation. Residual sedation present from prior procedure today. Versed 1 mg. given IV.  Monitoring: BP and pulse monitoring done. Oximetry used. Supplemental O2 given  Findings - Normal: to Duodenal 2nd Portion. Comments: otherwise normal examination.  OTHER FINDING: Schatski's ring in Distal Esophagus. Comments: No esophagitis.  The ring was broken with a single bite of biopsy forceps and then was dilated with CRE TTS balloon (43mm, 16.81mm, 75mm).  There was the usual minor, self limited bleeding.   Assessment Abnormal examination, see findings above.  Comments: Lower esophageal Schatzki's ring, dilated today to 70mm.  Events  Unplanned Intervention: No unplanned interventions were required.  Unplanned Events: There were no complications. Plans Comments: If dysphagia returns or is not improved, would repeat EGD  for dilation.  His liver tests are very minorly elevated and extensive lab workup suggest that it is from mild fatty liver.  He should be followed clinically with repeat lfts in 6 months. Patient Name: [name], [name] MRN:  Procedure Procedures: Panendoscopy (EGD) CPT: T1461772.    with balloon dilation. CPT: Y3133983.  Personnel: Endoscopist: Milus Banister, MD.  Referred By: Darrick Penna. Linna Darner, MD.  Exam Location: Exam performed in Endoscopy Suite. Outpatient  Patient Consent: Procedure, Alternatives, Risks and Benefits discussed, consent obtained, from patient. Consent was obtained by the RN.  Indications Symptoms: Dysphagia. Reflux symptoms  History  Current Medications: Patient is not currently taking Coumadin.  Comments: Patient history reviewed and updated, pre-procedure physical performed prior to initiation of sedation? yes Pre-Exam Physical: Performed Aug 28, 2005  Cardio-pulmonary exam, Abdominal exam, Mental status exam WNL.  Exam Exam Info: Maximum depth of insertion Duodenum, intended Duodenum. Patient position: on left side. Gastric retroflexion performed. Images taken. ASA Classification: II. Tolerance: good.  Sedation Meds: Patient assessed and found to be appropriate for moderate (conscious) sedation. Residual sedation present from prior procedure today. Versed 1 mg. given IV.  Monitoring: BP and pulse monitoring done. Oximetry used. Supplemental O2 given  Findings - Normal: to Duodenal 2nd Portion. Comments: otherwise normal examination.  OTHER FINDING: Schatski's ring in Distal Esophagus. Comments: No esophagitis.  The ring was broken with a single bite of biopsy forceps and then was dilated with CRE TTS balloon (75mm, 16.58mm, 69mm).  There was the usual minor, self limited bleeding.   Assessment Abnormal examination, see findings above.  Comments: Lower esophageal Schatzki's ring, dilated today to 34mm.  Events  Unplanned Intervention: No  unplanned interventions were required.  Unplanned Events: There were no complications. Plans Comments: If dysphagia returns or is not improved, would repeat EGD for dilation.  His liver tests are very minorly  elevated and extensive lab workup suggest that it is from mild fatty liver.  He should be followed clinically with repeat lfts in 6 months.

## 2010-02-06 NOTE — Letter (Signed)
Summary: [facility]   Imported By: Edmonia James 01/30/2008 09:58:45  _____________________________________________________________________  External Attachment:    Type:   Image     Comment:   External Document

## 2010-02-07 ENCOUNTER — Telehealth (INDEPENDENT_AMBULATORY_CARE_PROVIDER_SITE_OTHER): Payer: Self-pay | Admitting: *Deleted

## 2010-02-10 ENCOUNTER — Telehealth: Payer: Self-pay | Admitting: Internal Medicine

## 2010-02-11 ENCOUNTER — Telehealth: Payer: Self-pay | Admitting: Internal Medicine

## 2010-02-12 NOTE — Progress Notes (Signed)
Summary: refill lm 2/3--labs in 6 weeks--made appt  Phone Note Refill Request Message from:  Pharmacy on February 07, 2010 11:07 AM  Refills Requested: Medication #1:  BYETTA 10 MCG PEN 10 MCG/0.04ML  SOLN 10 units once in the evening 2 hours after last meal Patient needs appt. Left message on machine to call back to office.  (copied from 12/09/09 Recheck A1c , BUN, creat, & potassium in 6 weks ( 250.00, 585). [place])  Initial call taken by: Ernestene Mention CMA,  February 07, 2010 11:07 AM  Follow-up for Phone Call        appt made for 03/21/2010 at 8:30 Follow-up by: Berneta Sages,  February 07, 2010 11:51 AM

## 2010-02-12 NOTE — Letter (Signed)
Summary: [facility]  [facility]   Imported By: Edmonia James 02/03/2010 10:34:35  _____________________________________________________________________  External Attachment:    Type:   Image     Comment:   External Document

## 2010-02-12 NOTE — Letter (Signed)
Summary: [facility]  [facility]   Imported By: Edmonia James 02/05/2010 14:23:03  _____________________________________________________________________  External Attachment:    Type:   Image     Comment:   External Document

## 2010-02-20 NOTE — Letter (Signed)
Summary: [facility]  [facility]   Imported By: Laural Benes 02/14/2010 10:43:46  _____________________________________________________________________  External Attachment:    Type:   Image     Comment:   External Document

## 2010-02-20 NOTE — Progress Notes (Signed)
Summary: Refill Request  Phone Note Refill Request Call back at [phone] Message from:  Pharmacy on February 07, 2010 11:09 AM  Refills Requested: Medication #1:  NEXIUM 40 MG CPDR one pill twice a day   Dosage confirmed as above?Dosage Confirmed   Supply Requested: 90   Last Refilled: 12/31/2009   Notes: Patient states that directions have changed, please confirm. Rx request states "by mouth once daily as needed" [place]  Next Appointment Scheduled: none Initial call taken by: Elna Breslow,  February 07, 2010 11:10 AM  Follow-up for Phone Call        Entered By Dr.Jacobs Follow-up by: Georgette Dover CMA,  February 07, 2010 11:24 AM

## 2010-02-20 NOTE — Progress Notes (Signed)
Summary: Refill request--Nexium  Phone Note Refill Request Message from:  Fax from Pharmacy on February 11, 2010 11:24 AM  Refills Requested: Medication #1:  NEXIUM 40 MG CPDR one pill twice a day   Notes: Request from [facility]. Initial call taken by: Ernestene Mention CMA,  February 11, 2010 11:24 AM  Follow-up for Phone Call        this is ok Follow-up by: Milus Banister MD,  February 12, 2010 11:19 AM     Prescriptions: NEXIUM 40 MG CPDR (ESOMEPRAZOLE MAGNESIUM) one pill twice a day  #30 x 0   Entered by:   Ernestene Mention CMA   Authorized by:   Milus Banister MD   Signed by:   Ernestene Mention CMA on 02/14/2010   Method used:   Electronically to        [facility] (retail)       [address]       [address]       [place], [place]  [address]       Ph: [phone]       Fax: [phone]   RxID:   [id number]

## 2010-02-20 NOTE — Progress Notes (Signed)
Summary: Refill Request  Phone Note Refill Request Call back at [phone] Message from:  Pharmacy on February 10, 2010 8:38 AM  Refills Requested: Medication #1:  CRESTOR 20 MG TABS 1 by mouth once daily except 1/2 Tues   Dosage confirmed as above?Dosage Confirmed   Supply Requested: 30   Last Refilled: 01/13/2010  Medication #2:  000 1 a week **Vit D Check Due 01/2010**   Dosage confirmed as above?Dosage Confirmed   Supply Requested: 4   Last Refilled: 01/13/2010  Medication #3:  NEXIUM 40 MG CPDR one pill twice a day   Dosage confirmed as above?Dosage Confirmed   Supply Requested: 90   Last Refilled: 12/31/2009 [facility]  Initial call taken by: Elna Breslow,  February 10, 2010 8:39 AM  Follow-up for Phone Call        Patient has pending lab appt in March. I refilled Crestor and Vit D. Fax notes that Nexium is given by Dr. Ardis Hughs. Follow-up by: Ernestene Mention CMA,  February 11, 2010 11:06 AM    Prescriptions: VITAMIN D 50,000 1 a week **Vit D Check Due 01/2010**  #4 x 0   Entered by:   Ernestene Mention CMA   Authorized by:   Unice Cobble MD   Signed by:   Ernestene Mention CMA on 02/11/2010   Method used:   Faxed to ...       [facility] (retail)       [address], [place]  [address]       Ph: [phone]       Fax: [phone]   RxID:   [id number] CRESTOR 20 MG TABS (ROSUVASTATIN CALCIUM) 1 by mouth once daily except 1/2 Tues, Th, & Sat **LABS DUE 01/2010**  #30 x 0   Entered by:   Ernestene Mention CMA   Authorized by:   Unice Cobble MD   Signed by:   Ernestene Mention CMA on 02/11/2010   Method used:   Faxed to ...       [facility] (retail)       [address]       [place]       [place], [place]  [address]       Ph: [phone]       Fax: [phone]   RxID:   [id number]

## 2010-02-21 ENCOUNTER — Encounter (HOSPITAL_COMMUNITY)
Admission: RE | Admit: 2010-02-21 | Discharge: 2010-02-21 | Disposition: A | Discharge status: Home / Self Care | Payer: 59 | Source: Ambulatory Visit | Attending: Neurosurgery | Admitting: Neurosurgery

## 2010-02-21 DIAGNOSIS — Z01812 Encounter for preprocedural laboratory examination: Secondary | ICD-10-CM | POA: Insufficient documentation

## 2010-02-21 LAB — COMPREHENSIVE METABOLIC PANEL
ALT: 33 U/L (ref 0–53)
AST: 25 U/L (ref 0–37)
Albumin: 4.4 g/dL (ref 3.5–5.2)
Alkaline Phosphatase: 90 U/L (ref 39–117)
BUN: 15 mg/dL (ref 6–23)
CO2: 28 mEq/L (ref 19–32)
Calcium: 10.2 mg/dL (ref 8.4–10.5)
Chloride: 107 mEq/L (ref 96–112)
Creatinine, Ser: 1.42 mg/dL (ref 0.4–1.5)
GFR calc Af Amer: >60 mL/min (ref >60)
GFR calc non Af Amer: 52 mL/min — ABNORMAL LOW (ref >60)
Glucose, Bld: 108 mg/dL — ABNORMAL HIGH (ref 70–99)
Potassium: 4.9 mEq/L (ref 3.5–5.1)
Sodium: 142 mEq/L (ref 135–145)
Total Bilirubin: 0.6 mg/dL (ref 0.3–1.2)
Total Protein: 7.6 g/dL (ref 6.0–8.3)

## 2010-02-21 LAB — SURGICAL PCR SCREEN
MRSA, PCR: NEGATIVE
Staphylococcus aureus: NEGATIVE

## 2010-02-21 LAB — CBC
HCT: 39.3 % (ref 39.0–52.0)
Hemoglobin: 13.2 g/dL (ref 13.0–17.0)
MCH: 27 pg (ref 26.0–34.0)
MCHC: 33.6 g/dL (ref 30.0–36.0)
MCV: 80.4 fL (ref 78.0–100.0)
Platelets: 250 10*3/uL (ref 150–400)
RBC: 4.89 MIL/uL (ref 4.22–5.81)
RDW: 14.2 % (ref 11.5–15.5)
WBC: 6.3 10*3/uL (ref 4.0–10.5)

## 2010-02-27 ENCOUNTER — Observation Stay (HOSPITAL_COMMUNITY): Payer: 59

## 2010-02-27 ENCOUNTER — Observation Stay (HOSPITAL_COMMUNITY)
Admission: RE | Admit: 2010-02-27 | Discharge: 2010-02-28 | Disposition: A | Discharge status: Home / Self Care | Payer: 59 | Source: Ambulatory Visit | Attending: Neurosurgery | Admitting: Neurosurgery

## 2010-02-27 DIAGNOSIS — M5030 Other cervical disc degeneration, unspecified cervical region: Secondary | ICD-10-CM | POA: Insufficient documentation

## 2010-02-27 DIAGNOSIS — Z23 Encounter for immunization: Secondary | ICD-10-CM | POA: Insufficient documentation

## 2010-02-27 DIAGNOSIS — Z01810 Encounter for preprocedural cardiovascular examination: Secondary | ICD-10-CM | POA: Insufficient documentation

## 2010-02-27 DIAGNOSIS — M47812 Spondylosis without myelopathy or radiculopathy, cervical region: Principal | ICD-10-CM | POA: Insufficient documentation

## 2010-02-27 LAB — GLUCOSE, CAPILLARY
Glucose-Capillary: 104 mg/dL — ABNORMAL HIGH (ref 70–99)
Glucose-Capillary: 182 mg/dL — ABNORMAL HIGH (ref 70–99)

## 2010-02-28 LAB — GLUCOSE, CAPILLARY
Glucose-Capillary: 163 mg/dL — ABNORMAL HIGH (ref 70–99)
Glucose-Capillary: 216 mg/dL — ABNORMAL HIGH (ref 70–99)

## 2010-03-04 ENCOUNTER — Ambulatory Visit (INDEPENDENT_AMBULATORY_CARE_PROVIDER_SITE_OTHER): Payer: 59 | Admitting: Gastroenterology

## 2010-03-04 ENCOUNTER — Encounter: Payer: Self-pay | Admitting: Gastroenterology

## 2010-03-04 DIAGNOSIS — K5900 Constipation, unspecified: Secondary | ICD-10-CM

## 2010-03-04 DIAGNOSIS — K3189 Other diseases of stomach and duodenum: Secondary | ICD-10-CM

## 2010-03-06 NOTE — Op Note (Signed)
NAME:  [name], [name] NO.:  [id number]  MEDICAL RECORD NO.:  [id number]           PATIENT TYPE:  I  LOCATION:  [id number]                         FACILITY:  [facility]  PHYSICIAN:  Hosie Spangle, M.D.DATE OF BIRTH:  [birth year]  DATE OF PROCEDURE:  02/27/2010 DATE OF DISCHARGE:                              OPERATIVE REPORT   PREOPERATIVE DIAGNOSES: 1. Cervical spondylosis. 2. Cervical degenerative disk disease. 3. Cervical stenosis. 4. Neck pain. 5. Cervical radiculopathy.  POSTOPERATIVE DIAGNOSES: 1. Cervical spondylosis. 2. Cervical degenerative disk disease. 3. Cervical stenosis. 4. Neck pain. 5. Cervical radiculopathy.  PROCEDURES:  C5-6 and C6-7 anterior cervical decompression arthrodesis with allograft and Tether cervical plating.  SURGEON:  Hosie Spangle, MD  ASSISTANT:  Ophelia Charter, MD  ANESTHESIA:  General endotracheal.  INDICATION:  The patient is a 55 year old man who presented with neck and left cervical radiculopathy.  He was found to have advanced degenerative disk disease and spondylosis with neuroforaminal encroachment particularly to the left side at the C5-6 and C6-7 levels. A decision was made to proceed with decompression and arthrodesis.  PROCEDURE IN DETAIL:  The patient was brought to the operating room and placed under general endotracheal anesthesia.  The patient was placed in 10 pounds of Holter traction.  The neck was prepped with Betadine soap and solution and draped in sterile fashion.  A horizontal incision was made at the left side of the neck.  The line of incision was infiltrated with local anesthetic with epinephrine.  Incision was made and carried down through the subcutaneous tissue.  Bipolar electrocautery was used to maintain hemostasis.  Dissection was carried down to the subcutaneous tissue and platysma, and then through the avascular plane, leaving the sternocleidomastoid, carotid artery, and jugular  vein laterally and trachea and esophagus medially.  The ventral aspect of the vertebral column was identified and localized x-ray was taken.  The C5-6 and C6-7 intervertebral disk spaces identified.  Diskectomy was begun with incision of the annulus continuing with micro curettes and pituitary rongeurs.  The operating microscope was draped and brought in to the field to provide magnification, illumination, and visualization and the remainder of the decompression was performed using microdissection and microsurgical technique.  Anterior osteophytic overgrowth was removed using Kerrison punches and the high-speed drill and the osteophyte removal tool.  The diskectomy was continued with micro curettes, pituitary rongeurs, and then the cartilaginous endplates were removed using micro curettes and the high-speed drill.  Posterior osteophytic overgrowth was removed using the high-speed drill along with a 2-mm Kerrison punch with thin footplate.  There was extensive spondylotic degeneration.  We were able to carefully decompress the spinal canal, thecal sac at each level.  We then turned our attention to the neural foramina where there was significant stenosis particularly to the left side.  Gradually, we were able to remove the spondylotic encroachment and decompress the exiting nerve roots.  A small pin hole was seen in the dural sleeve of the left C7 nerve root.  A piece of Gelfoam was placed over that with thrombin.  We measured the height of the intervertebral disk  space and selected two allografts.  We used the 7-mm allograft at C5-6 and a 6-mm allograft to the C6-7.  Each allograft was hydrated in saline solution and gently tamped into position in the intervertebral disk space.  Once both grafts were in place, we selected a 32-mm Tether cervical plate.  It was positioned over the fusion construct and secured to the vertebra with 4 x 15-mm screws using a pair of variable screws at C5,  another pair at C7, and a single fixed screw at C6.  Each screw hole was drilled.  The screw was placed in alternating fashion.  Once all five screws were placed, final tightening was performed.  The wound was irrigated with bacitracin solution, checked for hemostasis, which was established and confirmed and then we proceeded with closure.  Platysma was closed with interrupted inverted 2- 0 undyed Vicryl sutures.  Subcutaneous and subcuticular were closed with interrupted and inverted 3-0 undyed Vicryl sutures.  Skin was approximated with Dermabond.  The procedure was tolerated well.  The estimated blood loss was 100 mL.  Sponge and needle count was correct. Following the surgery, the patient was taken out of the cervical traction to be reversed from the anesthetic, extubated, and transferred to the recovery room for further care.     Hosie Spangle, M.D.     RWN/MEDQ  D:  02/27/2010  T:  02/28/2010  Job:  [id number]  Electronically Signed by Jovita Gamma M.D. on 03/06/2010 07:57:20 AM

## 2010-03-13 NOTE — Assessment & Plan Note (Signed)
Review of gastrointestinal problems: 1.  Routine risk for colon cancer, colonoscopy 2007 without polyps. Recall colonoscopy at 10 year interval 2.  mild dysphasia, EGD 2007 , Schatzki's ring dilated with good relief of symptoms 3.  dyspepsia winter 2011 , likely related 2 high NSAID use, steroid use, narcotic use for back pains    History of Present Illness Visit Type: Follow-up Visit Primary GI MD: Owens Loffler MD Primary Provider: Unice Cobble, MD  Requesting Provider: na Chief Complaint: Follow up visit  History of Present Illness:      very pleasant 55 year old man who is here with his wife today. I saw him about 6 weeks ago. Since then he had surgery on neck last week.  Back on narcotic pain meds.  prior to the surgery...miralx was helping, stomach was feeling OK.  Cut back on PPI to once a day, but had to increase back to twice a day due to dyspepsia.  He stopped nsaids and has been using tylenol.            Current Medications (verified): 1)  Aspirin 81 Mg Tbec (Aspirin) .Marland Kitchen.. 1 By Mouth Qd 2)  Crestor 20 Mg Tabs (Rosuvastatin Calcium) .Marland Kitchen.. 1 By Mouth Once Daily Except 1/2 Tues, Th, & Sat **labs Due 01/2010** 3)  Lotrel 10-20 Mg Caps (Amlodipine Besy-Benazepril Hcl) .Marland Kitchen.. 1 By Mouth Qd 4)  Fish Oil 1000 Mg Caps (Omega-3 Fatty Acids) .Marland Kitchen.. 1 By Mouth Qd 5)  Allopurinol 300 Mg Tabs (Allopurinol) .Marland Kitchen.. 1 By Mouth Qd 6)  Byetta 10 Mcg Pen 10 Mcg/0.23ml  Soln (Exenatide) .Marland Kitchen.. 10 Units Once in The Evening 2 Hours After Last Meal 7)  Lyrica 150 Mg Caps (Pregabalin) .Marland Kitchen.. 1 By Mouth Two Times A Day 8)  Vitamin D 50,000 .Marland KitchenMarland Kitchen. 1 A Week **vit D Check Due 01/2010** 9)  Nexium 40 Mg Cpdr (Esomeprazole Magnesium) .... One Pill Twice A Day 10)  Vitamin D 1000 Unit Tabs (Cholecalciferol) .Marland Kitchen.. 1 Once Daily in Addition To 50,000 International Units Once Weekly 11)  Miralax  Pack (Polyethylene Glycol 3350) .... As Directed Per Pack 12)  Freestyle Lancets  Misc (Lancets) .... Check Bloodsugar  1-2 X Daily 13)  Freestyle Test  Strp (Glucose Blood) .... Check Bloodsugar 1-2x Daily 14)  Oxycodone-Acetaminophen 5-325 Mg Tabs (Oxycodone-Acetaminophen) .... As Needed For Pain  Allergies (verified): No Known Drug Allergies  Vital Signs:  Patient profile:   55 year old male Height:      70.75 inches Weight:      256 pounds BMI:     36.09 BSA:     2.34 Pulse rate:   68 / minute Pulse rhythm:   regular BP sitting:   136 / 80  (left arm) Cuff size:   large  Vitals Entered By: Hope Pigeon CMA (March 04, 2010 8:54 AM)  Physical Exam  Additional Exam:  Constitutional: generally well appearing  wearing a soft neck brace Psychiatric: alert and oriented times 3 Abdomen: soft, non-tender, non-distended, normal bowel sounds    Impression & Recommendations:  Problem # 1:   dyspepsia, constipation  for the most part she was feeling better from a GI standpoint prior to his neck surgery. since the neck surgery last week he is back on narcotic pain medicines but hopefully he will be off within the next week or 2. In the meantime he will probably require extra MiraLax. I've also recommended that he change the timing of his proton pump inhibitor since the majority of his upper GI,  dyspeptic symptoms are at night, while lying in bed. he'll return to see me on an as-needed basis  Patient Instructions: 1)  Decrease the narcotic pain meds as best that you can.  You might need to double the miralax in the meantime (2 scoops in AM). 2)  Return to see Dr. Ardis Hughs as needed. 3)  Change the nexium back to once a day, 20-30 min before dinner.  Avoid caffeine after 3pm, try not to eat anything within 3 hours of laying down for bed. 4)  The medication list was reviewed and reconciled.  All changed / newly prescribed medications were explained.  A complete medication list was provided to the patient / caregiver.

## 2010-03-21 ENCOUNTER — Encounter (INDEPENDENT_AMBULATORY_CARE_PROVIDER_SITE_OTHER): Payer: Self-pay | Admitting: *Deleted

## 2010-03-21 ENCOUNTER — Other Ambulatory Visit: Payer: PRIVATE HEALTH INSURANCE

## 2010-03-21 ENCOUNTER — Other Ambulatory Visit: Payer: Self-pay | Admitting: Internal Medicine

## 2010-03-21 ENCOUNTER — Other Ambulatory Visit (INDEPENDENT_AMBULATORY_CARE_PROVIDER_SITE_OTHER): Payer: 59

## 2010-03-21 DIAGNOSIS — E119 Type 2 diabetes mellitus without complications: Secondary | ICD-10-CM

## 2010-03-21 DIAGNOSIS — N181 Chronic kidney disease, stage 1: Secondary | ICD-10-CM

## 2010-03-21 LAB — BUN: BUN: 18 mg/dL (ref 6–23)

## 2010-03-21 LAB — HEMOGLOBIN A1C: Hgb A1c MFr Bld: 7.7 % — ABNORMAL HIGH (ref 4.6–6.5)

## 2010-03-21 LAB — POTASSIUM: Potassium: 4.9 mEq/L (ref 3.5–5.1)

## 2010-03-21 LAB — CREATININE, SERUM: Creatinine, Ser: 1.5 mg/dL (ref 0.4–1.5)

## 2010-03-24 LAB — CREATININE, SERUM
Creatinine, Ser: 1.76 mg/dL — ABNORMAL HIGH (ref 0.4–1.5)
GFR calc Af Amer: 49 mL/min — ABNORMAL LOW (ref >60)
GFR calc non Af Amer: 41 mL/min — ABNORMAL LOW (ref >60)

## 2010-03-24 LAB — BUN: BUN: 23 mg/dL (ref 6–23)

## 2010-04-05 ENCOUNTER — Encounter: Payer: Self-pay | Admitting: Internal Medicine

## 2010-04-08 ENCOUNTER — Encounter: Payer: Self-pay | Admitting: Internal Medicine

## 2010-04-08 ENCOUNTER — Ambulatory Visit (INDEPENDENT_AMBULATORY_CARE_PROVIDER_SITE_OTHER): Payer: 59 | Admitting: Internal Medicine

## 2010-04-08 DIAGNOSIS — I10 Essential (primary) hypertension: Secondary | ICD-10-CM

## 2010-04-08 DIAGNOSIS — N289 Disorder of kidney and ureter, unspecified: Secondary | ICD-10-CM

## 2010-04-08 DIAGNOSIS — M109 Gout, unspecified: Secondary | ICD-10-CM

## 2010-04-08 DIAGNOSIS — E785 Hyperlipidemia, unspecified: Secondary | ICD-10-CM

## 2010-04-08 DIAGNOSIS — IMO0001 Reserved for inherently not codable concepts without codable children: Secondary | ICD-10-CM

## 2010-04-08 MED ORDER — ERGOCALCIFEROL 1.25 MG (50000 UT) PO CAPS: 50000.0000 [IU] | ORAL_CAPSULE | ORAL | Status: DC

## 2010-04-08 MED ORDER — ATORVASTATIN CALCIUM 20 MG PO TABS: 20.0000 mg | ORAL_TABLET | Freq: Every day | ORAL | Status: DC

## 2010-04-08 MED ORDER — ALLOPURINOL 300 MG PO TABS: 150.0000 mg | ORAL_TABLET | Freq: Every day | ORAL | Status: DC

## 2010-04-08 MED ORDER — INSULIN GLARGINE 100 UNIT/ML ~~LOC~~ SOLN: 12.0000 [IU] | Freq: Every day | SUBCUTANEOUS | Status: DC

## 2010-04-08 MED ORDER — LISINOPRIL 20 MG PO TABS: 20.0000 mg | ORAL_TABLET | Freq: Every day | ORAL | Status: AC

## 2010-04-08 MED ORDER — AMLODIPINE BESYLATE 10 MG PO TABS: 10.0000 mg | ORAL_TABLET | Freq: Every day | ORAL | Status: DC

## 2010-04-08 NOTE — Progress Notes (Signed)
Subjective:    Patient ID: [name], male    DOB: 16-Sep-1955, 55 y.o.   [name]  HPI [name] returns for follow up of his diabetes; his A1c is 7.7% indicating poor control. He believes this is related to having taken steroids perioperatively approximately 2 and half months ago at the time of his neurosurgery.   The following questions were asked concerning diabetes status. Have  you had excess thirst, excess hunger or excess urination. Have you had lightheadedness with standing, ,chest pain ; palpitations; or pain in  calves with walking.Are there any new ulcers or sores on the skin which are nonhealing especially over the feet. Has there  been a significant change in  weight. Are you  exercising. Do you have numbness or tingling or burning in your feet.  He has not been able to exercise since his neurosurgical procedure. He denies any hypoglycemia.  It was explained to him that the A1c of 7.7% would be an average sugar of 175 with a 54% long-term cardiovascular risk.  He has been on Byetta. The need to switch to insulin because of uncontrolled DM  was discussed.  Additionally his creatinine was 1.5; he's been on allopurinol 300 mg daily.  Although his blood pressure is well controlled; he wishes to switch his dual agent  Lisinopril/Amlodipine  to 2 separate agents which he can get free through [facility].  His Triglycerides were elevated; the role of high fructose corn sugar  syrup sugar in raising triglycerides was discussed. Reading labels for HFCS sugar content  was reviewed.            Review of Systems     Objective:   Physical Exam Eye - Pupils Equal Round Reactive to light, Extraocular movements intact, Conjunctiva without redness or discharge.                                                                                               Heart:  Normal rate and regular rhythm. S1 and S2 normal without gallop, murmur, click, rub or other extra sounds.                                                                                                                                          Lungs:Chest clear to auscultation; no wheezes, rhonchi,rales ,or rubs present.No increased work of breathing. Extremities:  No cyanosis, edema, or deformity noted. Good nail health.  Skin:  Intact without suspicious lesions or rashes.  Neurologic exam : Sensation to light touch normal over feet.  Balance normal   Psych:  Cognition and judgment appear intact. Alert, communicative  and cooperative with normal attention span and concentration. Focused ; motivated          Assessment & Plan:  #1 uncontrolled diabetes  #2 mild renal insufficiency; on allopurinol  #3 gout, past medical history  #4 spinal stenosis with lumbar radiculopathy, status post surgical correction  #5 vitamin D deficiency, past medical history of  #6 hypertension controlled  #7 hyperlipidemia, presently on Crestor. Atorvastatin will be prescribed in place of Crestor for financial reasons.  Plan:Byetta will be changed to Lantus Solostar Pen 12 units daily. Allopurinol will be decreased to 300 mg one half daily with followup of renal function in 10 weeks  The dual hypertensive agent will be broken down into 2 agents for financial reasons

## 2010-04-08 NOTE — Patient Instructions (Addendum)
Your diabetes is not well controlled; the A1c of 7.7% means that your average sugar would be 175 and your long-term risk  54%. Also of concern is allopurinol which can have a negative impact on your kidney function. Your kidney function is low normal at 1.5. Your blood pressure is well controlled. We will change the combination med to 2 separate medicines  so they will be covered                                               .Diabetes Monitor   The A1c test checks the average amount of glucose (sugar) in the blood over the last 2 to 3 months.As glucose circulates in the blood, some of it binds to hemoglobin A. This is the main form of hemoglobin in adults. Hemoglobin is a red protein that carries oxygen in the red blood cells (RBC's). Once the glucose is bound to the hemoglobin A, it remains there for the life of the red blood cell (about 120 days). This combination of glucose and hemoglobin A is called A1c (or hemoglobin A1c or glycohemoglobin). Increased glucose in the blood, increases the hemoglobin A1c. A1c levels do not change quickly but will shift as older RBC's die and younger ones take their place.   The A1c test is used primarily to monitor the glucose control of diabetics over time. The goal of those with diabetes is to keep their blood glucose levels as close to normal as possible. This helps to minimize the complications caused by chronically elevated glucose levels, such as progressive damage to body organs like the kidneys, eyes, cardiovascular system, and nerves. The A1c test gives a picture of the average amount of glucose in the blood over the last few months. It can help a patient and his doctor know if the measures they are taking to control the patient's diabetes are successful or need to be adjusted.     Depending on the type of diabetes that you have, how well your diabetes is controlled, your A1c may be measured 2 to 4 times each year. When someone is first diagnosed with diabetes or if  control is not good, A1c may be ordered more frequently.   NORMAL VALUES  Non diabetic adults: 5 %-6.1%  Good diabetic control: 6.2-6.4 %  Fair diabetic control: 6.5-7%  Poor diabetic control: greater than 7 % ( except with additional factors such as  advanced age; significant coronary or neurologic disease,etc). Check the A1c every 6 months if it is < 6.5%; every 4 months if  6.5% or higher. Goals for home glucose monitoring are : fasting  or morning glucose goal of  90-150. Two hours after any meal , goal = < 180, preferably < 150.   Please  decrease Allopurinol a  to one half pill daily. Lantus insulin will be started in place of the Byetta and Atorvastatin 20 mg daily in place of Crestor . Please have fasting labs checked in 10 weeks (A1c, BUN, creatinine, potassium,lipids,hepatic panel and uric acid; 250.02, 790.6, 995.2).

## 2010-04-09 ENCOUNTER — Other Ambulatory Visit: Payer: Self-pay | Admitting: *Deleted

## 2010-04-09 NOTE — Telephone Encounter (Signed)
Pharmacy called

## 2010-04-11 LAB — GLUCOSE, CAPILLARY
Glucose-Capillary: 106 mg/dL — ABNORMAL HIGH (ref 70–99)
Glucose-Capillary: 117 mg/dL — ABNORMAL HIGH (ref 70–99)
Glucose-Capillary: 129 mg/dL — ABNORMAL HIGH (ref 70–99)
Glucose-Capillary: 134 mg/dL — ABNORMAL HIGH (ref 70–99)
Glucose-Capillary: 141 mg/dL — ABNORMAL HIGH (ref 70–99)

## 2010-04-12 LAB — CBC
HCT: 38.6 % — ABNORMAL LOW (ref 39.0–52.0)
Hemoglobin: 12.5 g/dL — ABNORMAL LOW (ref 13.0–17.0)
MCHC: 32.4 g/dL (ref 30.0–36.0)
MCV: 85.2 fL (ref 78.0–100.0)
Platelets: 217 10*3/uL (ref 150–400)
RBC: 4.53 MIL/uL (ref 4.22–5.81)
RDW: 15.6 % — ABNORMAL HIGH (ref 11.5–15.5)
WBC: 6 10*3/uL (ref 4.0–10.5)

## 2010-04-12 LAB — BASIC METABOLIC PANEL
BUN: 16 mg/dL (ref 6–23)
CO2: 29 mEq/L (ref 19–32)
Calcium: 10.1 mg/dL (ref 8.4–10.5)
Chloride: 102 mEq/L (ref 96–112)
Creatinine, Ser: 1.53 mg/dL — ABNORMAL HIGH (ref 0.4–1.5)
GFR calc Af Amer: 58 mL/min — ABNORMAL LOW (ref >60)
GFR calc non Af Amer: 48 mL/min — ABNORMAL LOW (ref >60)
Glucose, Bld: 124 mg/dL — ABNORMAL HIGH (ref 70–99)
Potassium: 4.6 mEq/L (ref 3.5–5.1)
Sodium: 138 mEq/L (ref 135–145)

## 2010-04-12 LAB — TYPE AND SCREEN
ABO/RH(D): O POS
Antibody Screen: NEGATIVE

## 2010-04-12 LAB — ABO/RH: ABO/RH(D): O POS

## 2010-04-15 ENCOUNTER — Telehealth: Payer: Self-pay | Admitting: *Deleted

## 2010-04-15 DIAGNOSIS — IMO0001 Reserved for inherently not codable concepts without codable children: Secondary | ICD-10-CM

## 2010-04-15 NOTE — Telephone Encounter (Signed)
Spoke w/ pt says that blood sugars have still been elevated in the am has been 160-170's, before lunch has been 160's, and after meals has been been 195-205. At 1 point between breakfast and lunch it was 105 so pt would like to know what he should do.

## 2010-04-15 NOTE — Telephone Encounter (Signed)
Left msg for return call

## 2010-04-15 NOTE — Telephone Encounter (Signed)
Increase the Lantus by 5 units each day; your goal should be fasting sugars of  90- 150 and 2 hours after meal less than 180.

## 2010-04-16 NOTE — Telephone Encounter (Signed)
Pt aware of instructions

## 2010-04-25 ENCOUNTER — Other Ambulatory Visit: Payer: Self-pay

## 2010-04-25 MED ORDER — LANCETS MISC: Status: DC

## 2010-04-25 NOTE — Telephone Encounter (Signed)
I left a message for patient to return call to discuss refill from [facility] for Freestyle Lancets. At last OV patient stated he was no longer using Freestyle machine and was using a TrueResult machine that is free through his work. I need patient clarify

## 2010-04-25 NOTE — Telephone Encounter (Signed)
Another note came over from the pharmacy indicating patient needs TrueTest Lancets and not Freestyle, rx sent to the pharmacy.  I spoke with patient and informed him to disregard message left on his voicemail and refill was completed

## 2010-05-13 ENCOUNTER — Other Ambulatory Visit: Payer: Self-pay

## 2010-05-13 MED ORDER — ERGOCALCIFEROL 1.25 MG (50000 UT) PO CAPS: 50000.0000 [IU] | ORAL_CAPSULE | ORAL | Status: DC

## 2010-05-13 NOTE — Telephone Encounter (Signed)
VIT D 268.9

## 2010-05-21 ENCOUNTER — Encounter: Payer: 59 | Attending: Internal Medicine

## 2010-05-23 NOTE — Op Note (Signed)
NAME:  [name], [name] NO.:  [id number]   MEDICAL RECORD NO.:  [id number]          PATIENT TYPE:  OIB   LOCATION:  [id number]                         FACILITY:  [facility]   PHYSICIAN:  Hosie Spangle, M.D.DATE OF BIRTH:  July 16, 1955   DATE OF PROCEDURE:  08/06/2008  DATE OF DISCHARGE:                               OPERATIVE REPORT   PREOPERATIVE DIAGNOSES:  L4-5 lateral recess stenosis, lumbar  spondylosis, lumbar degenerative disk disease, and lumbar radiculopathy.   POSTOPERATIVE DIAGNOSES:  L4-5 lateral recess stenosis, lumbar  spondylosis, lumbar degenerative disk disease, and lumbar radiculopathy.   PROCEDURE:  Bilateral L4-5 lumbar laminotomy, medial facetectomy and  foraminotomy with decompression of the exiting L4 and L5 nerve roots  bilaterally with microdissection, and bilateral L4-5 posterior  arthrodesis with interspinous structural allograft and Affix spinous  process plate and morselized allograft (Osteocel Plus)   SURGEON:  Hosie Spangle, MD   ASSISTANT:  1. Russell L. Judeth Horn, RN  2. Ophelia Charter, MD   ANESTHESIA:  General endotracheal.   INDICATIONS:  This is a 55 year old man who presented with low back and  left lumbar radicular pain.  He had undergone a number of epidural  steroid injections, but without lasting relief.  He is found to have  multilevel degenerative changes to the lumbar spine, but the worst  degenerative seen with moderate canal stenosis and bilateral lateral  recess stenosis, left worse than right at the L4-5 level.  The decision  was made to proceed with decompression and stabilization.   PROCEDURE:  The patient was brought to the operating room and placed  under general endotracheal anesthesia.  The patient was turned to the  prone position.  Lumbar region was prepped with Betadine soap and  solution and draped in a sterile fashion.  The midline was infiltrated  with local anesthetic with epinephrine and a midline  incision was made  over the L4-5 level and carried down through the subcutaneous tissue.  Bipolar cautery and electrocautery were used to maintain hemostasis.  Dissection was carried down to the lumbar fascia which was incised  bilaterally and the paraspinal muscles were dissected from his spinous  process and lamina in the subperiosteal fashion.  An x-ray was taken and  the L4-5 level was identified and then self-retaining retractor was  adjusted and using magnification and microdissection microsurgical  technique we proceeded with bilateral L4-5 laminotomy, medial  facetectomy, and foraminotomy.  We used a Building surveyor punches along with variety, variety of curettes.  There was  significant facet degeneration. Ligamentum flavum was thickened and this  was carefully removed taking care to leave the underlying thecal sac and  nerve roots undisturbed.  There was significant hypertrophic facet  arthropathy and this was carefully removed as well and we were able to  decompress the lateral recesses, spinal canal itself, and the neural  foramen.  The joints themselves were opened.  We placed an interspinous  distractor and we were able to scrape away with microcurettes the  cartilaginous surfaces of the facet joints down to a good bony surface  bilaterally.  Later in the case, we were able to pack morselized  allograft (Osteocel Plus) in the facet joints.   We examined the neural foramen with micro-hook to ensure good  decompression and once decompression was completed, we established  hemostasis and then proceeded with arthrodesis.   The interspinous surface of the spinous process were cleaned of soft  tissue down to a good bony surface.  We then measured the interspinous  space and selected a 12-mm interspinous implant.  This was carefully  positioned in the interspinous space and then we selected a 45-mm S6  spinous process plate.  This was secured over the bone  graft and  tightened down against the spinous process.  We then packed the  remaining morselized allograft in around the facet joints bilaterally as  well as the interspinous space between the spinous processes of L4 and  L5.   Once this was completed, an x-ray was taken which showed all of the  implants in good position, some distraction of the posterior aspect of  the disk space and neural foramen compared to preoperative films and  then once hemostasis was established confirmed, we proceeded with  closure.  Deep fascia was closed with interrupted undyed #1 Vicryl  sutures.  Scarpa fascia was closed with interrupted undyed #1 Vicryl  sutures.  Subcutaneous and subcuticular were closed with inverted 2-0  undyed Vicryl sutures.  Skin was closed with Dermabond.  The wound was  dressed with Adaptic sterile gauze.  The procedure was tolerated well.  Estimated blood loss was 50 mL.  Sponge count correct.  Following  surgery, the patient was turned back to supine position, reversed the  anesthetic, extubated, and transferred to the recovery room for further  care.      Hosie Spangle, M.D.  Electronically Signed     RWN/MEDQ  D:  09/06/2008  T:  09/07/2008  Job:  [id number]

## 2010-05-23 NOTE — Letter (Signed)
October 21, 2006   Burney Gauze M.D  [address] [place],   [address]   Re:  [name], [name]                 DOB:  [birth year]   Dear Collier [place]ina:   I saw the patient back in the office today and we discussed about  putting a pleurx catheter in his right chest.  He and his wife will  think about it and we gave them a video.  I think this is the best  option rather than repeated thoracentesis.  Blood pressure was 119/75,  pulse 100, respirations 18, saturation 95%.  He had decreased breath  sounds on the right.   Nicanor Alcon, M.D.  Electronically Signed   DPB/MEDQ  D:  10/21/2006  T:  10/22/2006  Job:  [id number]

## 2010-05-23 NOTE — Discharge Summary (Signed)
[place]. [facility]  Patient:    [name], [name] Visit Number: [id number] MRN: [id number]          Service Type: MED Attending Physician:  Roseanne Kaufman Dictated by:   Jannette Fogo. Jenean Lindau, P.A.-C. Admit Date:  03/31/2001 Discharge Date: 04/05/2001                             Discharge Summary  ADMITTING DIAGNOSES: 1. Open fracture and laceration of the left index and ring finger status post    skill saw injury. 2. History of diet-controlled diabetes. 3. History of gout.  DISCHARGE DIAGNOSES: 1. Open fracture and laceration of the left index and ring finger status post    skill saw injury, improved. 2. History of diet-controlled diabetes. 3. Exacerbation of gout while inpatient.  PROCEDURE:  Irrigation and debridement of the left hand with repair of nerves, arteries and tendons as necessary.  Please see procedure note.  SURGEON:  Roseanne Kaufman, M.D.  ASSISTANTJaclyn Shaggy P. Mahar, P.A.  CONSULTS:  None.  BRIEF HISTORY OF PRESENT ILLNESS:  [name] is a 55 year old male who sustained an injury to his left hand while operating a skill saw.  The patient was seen and evaluated by Dr. Roseanne Kaufman in the [facility] emergency room. The patient was found to have extensive damage to the left hand involving the index and ring finger with associated neurovascular destruction and lacerations noted.  Due to the nature of this injury, the decision was made to undergo operative intervention.  PREOPERATIVE LABORATORY AND X-RAYS:  H&H was 13.1 and 39.5.  PH was slightly elevated at 7.448, pCO2 was 32.5, PT and INR 13 and 1.0 respectively.  BMET showed his glucose was 111, otherwise within normal limits.  HOSPITAL COURSE:  [name] was admitted on March 31, 2001 and underwent extensive surgery to the left ring finger as well as index finger per Dr. Roseanne Kaufman; please see operative note for details.  On postoperative day #1, the patient was doing well,  his vital signs were stable, and he was afebrile.  He was having pain as was expected.  However, it was controlled well.  Physical examination showed that he had a positive refill less than two seconds of the index finger with good refill noted in the remaining fingers. OT and hand therapy was ordered for postoperative day #1.  The patient was placed on precautions status post his surgery, i.e., to avoid caffeine, caffeinated products, and extremes in temperature.  On postoperative day #2, the patient was doing well and was resting.  His wife was in the room. Overall, the patient said he was doing "okay."  He was afebrile and he had excellent capillary refill in his fingers.  Turgor was intact.  The following days, the patient continued to improve as postoperative precautions were followed.  On postoperative day #4, the patient was doing fairly well concerning his hand.  However, he did complain of left ankle pain, stating that this felt similar to a gouty exacerbation in nature.  He was taking allopurinol and colchicine at home.  He was given Indocin x1 the night before with good results.  Examination of the left upper extremity showed splint was intact.  It was clean and dry, had good capillary refill.  He did have numbness as expected mainly in the index finger.  He did have sensation to pressure in the remaining digits.  Examination of his right  lower extremity showed no redness or swelling about the medial malleolus noted but there was exquisite tenderness noted.  Therefore, the patient was placed on IV colchicine.  The following day, he was doing better and his gouty symptoms had improved, and on postoperative day #5, the patient was resting in bed stating his had was without pain.  He had noted some improvement in his ankle pain. However, there was still some significant pain.  He was ready for discharge and stated he was ready to go home.  His vital signs were stable.  He had  low grade fever of 99.1.  Left upper extremity showed the wound was clean, dry, and intact.  The fingers were with good refill and sensation was unchanged from previous exam.  Due to his improved condition, the decision was made to discharge the patient home.  ASSESSMENT: 1. Status post skill saw accident to the left hand with an I&D and repair of    associated structures per Dr. Roseanne Kaufman.  Please see procedures. 2. History of gout with acute exacerbation in patient. 3. History of diet-controlled diabetes mellitus.  CONDITION ON DISCHARGE:  Improved.  DIET:  The patient is to avoid all caffeinated products as well as chocolate or ice for six weeks.  ACTIVITIES:  The patient is to keep the left hand warm at all times.  He will follow up with hand therapy in the a.m.  He should keep his splint clean, dry, and intact.  DISCHARGE MEDICATIONS: 1. Pepcid 20 mg, 1 p.o. b.i.d. #60. 2. Colace 100 mg, 1 p.o. b.i.d. #60. 3. Percocet 5 mg, 1-2 p.o. q.4-6h p.r.n. #40. 4. Robaxin 500 mg, 1 p.o. q.6h p.r.n. #40.  FOLLOWUP:  The patient is to follow up with Dr. Amedeo Plenty for the following afternoon at 2 p.m. and with hand therapy at 3 p.m.  Appointment has previously been scheduled for this.  He is to call [phone] for any questions and he is to bring all of his home medications in to his appointment that is scheduled for the a.m. Dictated by:   Jannette Fogo. Jenean Lindau, P.A.-C. Attending Physician:  Roseanne Kaufman DD:  05/02/01 TD:  05/03/01 Job: [id number]

## 2010-05-23 NOTE — Op Note (Signed)
[place]. [facility]  Patient:    [name], [name] Visit Number: [id number] MRN: [id number]          Service Type: MED Location: [place] 01 Attending Physician:  [name] Dictated by:   [name] III, M.D. Proc. Date: 03/31/01 Admit Date:  03/31/2001   CC:         [facility]   Operative Report  PREOPERATIVE DIAGNOSES:  Status post chainsaw injury to the left index and ring finger, with near amputation of the left index finger and severance of the ulnar and radial nerve vascular bundles; as well as the flexor digitorum and superficialis in zone 2, and f of the proximal phalanx interarticularly. The patients left ring finger underwent traumatic wound distally, with nailbed injury and no bony fracture.  Middle finger and small fingers are without significant injury.  POSTOPERATIVE DIAGNOSES:  Status post chainsaw injury to the left index and ring finger, with near amputation of the left index finger and severance of the ulnar and radial nerve vascular bundles, as well as the flexor digitorum and superficialis in zone 2, and fracture of the proximal phalanx interarticularly.  The patients left ring finger underwent traumatic wound distally, with nailbed injury and no bony fracture.  Middle finger and small fingers are without significant injury.  PROCEDURES:  1. Incision and drainage, left ring finger skin and subcutaneous tissue, and     nailbed tissue.  2. Nailbed repair, left ring finger.  3. Closure of 2 cm defect, left ring finger.  4. Incision and drainage of skin and subcutaneous tissue, tendon and bone;     left index finger, in the form of an excisional debridement.  5. Open reduction internal fixation, left interarticular PIP fracture about     the left index finger; secondary to a comminuted chainsaw injury.     Allograft bone graft was placed in the defect.  6. Stress radiography, left index finger  7. Flexor  digitorum profundus repair in zone 2, left index finger.  8. Flexor digitorum superficialis repair in zone 2, left index finger.  9. Ulnar digital nerve repair, zone 2 of left index finger. 10. Radial digital nerve repair, zone 2 of left index finger. 11. Ulnar digital artery repair, zone 2 of left index finger; to     re-establish arterial flow to the finger. 12. Microscope used for the above microvascular repairs about the left     index finger. 13. Loose closure of lacerations about the left index finger.  SURGEON:  [name], III, M.D.  ASSISTANTJaclyn Shaggy P. [name], P.A.  ANESTHESIA:  General LMA.  COMPLICATIONS:  None.  ESTIMATED BLOOD LOSS:  Less than 100 cc.  INDICATIONS FOR PROCEDURE:  This patient is a 55 year old male who was injured while working today with a chainsaw.  He sustained the above-mentioned injuries.  He was seen in the emergency room, given Ancef, tetanus shot and underwent preoperative lab and cardiac and chest x-ray evaluations.  Following this he was taken to the operative suite.  I discussed with him the risks and benefits of surgery, including the risks of infection, bleeding, anesthesia damage to normal structures and failure of surgery to accomplish goals.  I have discussed with him that there are no guarantees in trying to reattach his finger; he may have cold intolerance, stiffness and a poor functional digit if it is able to be reattached.  We have discussed survival issues of the index finger, etc; he desires to  proceed.  I have discussed with him all risks and benefits, including possible need for tenolysis and bone grafting given the defect.  I have discussed with him the extreme chewing and destructive injury of the chainsaws blade.  He understands all of this and desires to proceed.  OPERATION IN DETAIL:  The patient was seen by myself in anesthesia, taken to the operative suite and underwent a smooth induction of general  LMA anesthesia.  Tourniquet was applied to the left upper extremity.  Following this the table was turned.  He was appropriately padded, prepped and draped in sterile fashion under my directions.  Prior to the prep and drape, I placed hydrogen peroxide over the hand and removed any bloody debris and other abnormalities.  I took a good look at the finger at that time; the middle and small fingers were normal, the thumb was normal.  The index and ring fingers were injured as described above, and within the context of this operation.  Once the patient had a sterile field secured, he then underwent I&D.  I performed I&D initially of the left ring finger, including skin and subcutaneous tissue down to the periosteal tissue.  There was no obvious fracture.  The patient had a portion of his nailplate removed, and the nailbed was scrubbed.  Following I&D and copious irrigation, the patient underwent a nailbed repair of the left ring finger.  This was done with a chromic suture under 4-0 loop magnification.  Following this he underwent closure of a 2 cm defect about the left ring finger.  This was done to my satisfaction loosely and without difficulty.  He had good refill in the tip of the finger.  Following this, attention was turned towards the index finger.  The patient had poor blood flow to the finger and a severely mangled digit.  He underwent I&D of the skin and subcutaneous tissue, tendon and bone.  It was quite evident the patient had a large amount of bone loss radially.  The bone loss was significant.  The condyle portion of the PIP joint radially was a free-floating piece, and at this point and time I then performed provisional fixation, brought in x-ray and then he underwent placement of two 1.7 ligature titanium screws to affix the condyle.  Following achieving excellent purchase with this, I then placed the finger through a range of motion; it was noted to be very stable and  had a nice range of motion without problems.   I then performed copious irrigation of the area and at the conclusion of the case did place a small amount of Allograft cancellous bone graft in the tissue.  He tolerated this well and there was no problems with this.  Once this was done, I then turned attention towards the finger.  It was quite clear that the radial and ulnar nerve vascular bundles were both disrupted.  I dissected out grossly the ulnar digital nerve and arteries, as well as radial digital nerve and artery.  The radial digital artery was quite poor.  The patient then had the FDP and FDS tendons identified.  Copious irrigation was applied with the sheath.  I opened the A3 sheath as needed, and following this retrieved the FDP and FDS tendons proximally through a small transverse incision over the proximal palmar crease.  These were then place through the sheath and back into the main wound.  The patient then underwent placement of 4-0 Ethibond sutures about the tendons.  The FDS was  repaired with an 8-strand repair of 4-0 Ethibond without difficulty.  This was then sewn to and done under 4-0 serial loop magnification to my satisfaction, without excessive tension.  It approximated well.  The chiasm of digitorum was looking well at the conclusion of this.  Following this, the patient had a four-strand modified Kessler-DeJimmy repair of the FDP tendon; with a 6-0 Prolene epitendinous suture placed to create a smooth, gliding surface.  I was very pleased with the tendon repair; it went excellently.  The patient tolerated this well.  Following repair of the FDP and FDS in zone 2 to my satisfaction without difficulty, I then dropped the tourniquet and established flow in the ulnar digital artery, both antegrade and retrograde.  The patient had lacrimal duct dilator brought in, and straight serrated scissors placed about the two ends of the artery.  Following this, flow was  established in through the ulnar digital artery, and vessel clamp was placed, followed by a 10-0 nylon repair of the ulnar digital artery.  After this was repaired, the flow was excellent through it.  Once the tourniquet was deflated, the patient had a stovepipe-look with excellent flow through the anastomosis site.  At this point and time, the finger regained refill; it was pink, warm and looked well.  The patient did have some bruising distally in the finger; however, it was quite evident that the finger had regained its vascular integrity.  At this point and time, I then performed a repair of the ulnar digital nerve and radial digital nerve in zone 2, under the microscope with 9-0 nylon.  This was an epineural repair of 4-6 sutures in each nerve.  The patient had excellent approximation.  Once again, the two ends were prepared with serrated scissors of the straight variety.  I was pleased with the repairs.  The microscope was then taken out of the field, as the repairs were satisfactory. I then checked the bony area flexor tendons and overall condition of the wound.  Copious warm irrigation was applied to this region, and following this I then loosely closed the wound with Prolene and chromic.  The patient maintained good refill.  I then placed Adaptic and Xeroform gauze over the wounds, and then place a sterile, bulky dressing.  The room was kept warm and the patient was extubated from LMA general anesthetic; then transferred to the recovery room, where excellent refill was noted to be present.  DISPOSITION:  The patient tolerated the procedure well.  There were no complications.  Stress x-ray did reveal competency of the bony repair, and I do think he will be a candidate for early range of motion if the vascular status remains excellent.  I am going to have him on neurovascular precautions, including no smoking, no caffeine or chocolate, warm room and dextran LMD 40.  We will watch  his blood sugars and his overall progress.  I have discussed all issues with his wife and discussed with him that there are absolutely no guarantees in regards to the viability of his finger; but, we will do everything we can to try and preserve good function and continued viability to the finger.  All questions have been encouraged and answered. Dictated by:   [name] III, M.D. Attending Physician:  [name] DD:  03/31/01 TD:  04/02/01 Job: [id number]

## 2010-05-23 NOTE — Assessment & Plan Note (Signed)
[facility] OFFICE NOTE   NAME:ALLENZechariah, [name]                        MRN:          [id number]  DATE:07/31/2005                            DOB:          [birth year]    REFERRING PHYSICIAN:  Darrick Penna. Linna Darner, MD, FCCP   GASTROENTEROLOGY CONSULTATION:   REFERRING PHYSICIAN:  Dr. Unice Cobble   REASON FOR REFERRAL:  Dr. Linna Darner asked me to evaluate [name] in  consultation regarding dysphagia and screening colonoscopy.   HISTORY OF PRESENT ILLNESS:  [name] is a very pleasant 55 year old man  with three likely unrelated issues.  First, he complains of intermittent  solid food dysphagia for the past 1 year that has gradually been getting  worse.  He believes rice and chicken are the worst offenders and what he  will do to relieve the feeling of a substernal catch is sip some water,  sometimes hack up the food.  He has had intermittent pyrosis and for that he  was taking Prilosec in the past but he has not been taking that recently.   Second, he is concerned about mildly abnormal liver tests.  He and his wife  understand that his liver tests may have been elevated 3-4 years ago, this  has been attributed to his cholesterol medicine which is likely indeed the  case or perhaps contribution of fatty liver disease.  He has never had overt  jaundice, never been told he had liver troubles in the past, never exposed  to blood, no tattoos in the distant past, no IV drug use in the distant  past.   Last, he has mild constipation that has been somewhat relieved since taking  an omega-3 fiber cookie.  He is definitely happier with the way his bowels  move this way.  He has never had screening colonoscopy or screening for  colon cancer of any kind.   REVIEW OF SYSTEMS:  Review of systems is essentially normal and is available  on his nursing intake sheet.   PAST MEDICAL HISTORY:  1.  Hypertension.  2.   Elevated cholesterol.  3.  Diabetes.   CURRENT MEDICINES:  Crestor, Lotrel, allopurinol, Avandia.   ALLERGIES:  NO KNOWN DRUG ALLERGIES.   SOCIAL HISTORY:  Married with nine children, works in [employer].  Nonsmoker, nondrinker.   FAMILY HISTORY:  Sister with diabetes.  No colon cancer or colon polyps in  family.   PHYSICAL EXAMINATION:  VITAL SIGNS:  Six feet, 255 pounds, blood pressure  132/66, pulse 76.  CONSTITUTIONAL:  Quite muscularly built, well appearing.  NEUROLOGIC:  Alert and oriented x3.  EYES:  Extraocular movements intact.  MOUTH:  Oropharynx moist.  No lesions.  NECK:  Supple.  No lymphadenopathy.  CARDIOVASCULAR:  Heart regular rate and rhythm.  LUNGS:  Clear to auscultation bilaterally.  ABDOMEN:  Soft, nontender, nondistended, normal bowel sounds.  EXTREMITIES:  No lower extremity edema.  SKIN:  No rash or lesions on visible extremities.   ASSESSMENT AND PLAN:  This is a 55 year old man with mildly elevated liver  transaminases, intermittent solid food  dysphagia, routine risks for  colorectal cancer.   I will arrange for blood tests to look into his abnormal liver tests  including hepatitis A, B, C, iron studies, ANA, ASMA, INR (he will also get  an ultrasound).  He is at routine risk for colorectal cancer and we will  arrange for him to have a colonoscopy at his soonest convenience.  At the  same time we will perform upper endoscopy to look into his solid food  dysphagia.  If he has stricture that will be dilated at the same time.  I  also recommended he restart taking Prilosec 20-30 minutes before his first  meal a day and I have also recommended that if he does not want to take the  omega cookies anymore that Citrucel fiber supplementation is generally very  helpful to regulate mild constipation such as his.                                   Milus Banister, MD   DPJ/MedQ  DD:  07/31/2005  DT:  07/31/2005  Job #:  [id number]   cc:   Darrick Penna.  Linna Darner, MD, FCCP

## 2010-05-23 NOTE — Discharge Summary (Signed)
. [facility]  Patient:    [name], [name] Visit Number: [id number] MRN: [id number]          Service Type: MED Attending Physician:  Roseanne Kaufman Dictated by:   Jannette Fogo. Jenean Lindau, P.A.-C. Admit Date:  03/31/2001 Discharge Date: 04/05/2001                             Discharge Summary  INCOMPLETE REPORT Dictated by:   Aaron Edelman L. Jenean Lindau, P.A.-C. Attending Physician:  Roseanne Kaufman DD:  05/02/01 TD:  05/03/01 Job: [id number]

## 2010-05-23 NOTE — H&P (Signed)
[place]  Patient:    [name], [name] Visit Number: [id number] MRN: [id number]          Service Type: MED Location: [id number] Attending Physician:  Wynetta Fines Dictated by:   Duncan Dull Troncale, P.A.C. Admit Date:  03/31/2001                           History and Physical  CHIEF COMPLAINT:  Left hand lacerations of index and ring fingers.  HISTORY OF PRESENT ILLNESS:  [name] is a 55 year old, right-hand dominant male who presents with a Skill saw injury to his left hand.  Today while at work, he was cutting a cabinet face with the piece properly placed in the vice when the saw blade caught, jumped, and cut his left hand.  He immediately sustained injuries to the left index and ring fingers.  He immediately had pain and bleeding from this site.  He has had no previous problems of the hand.  He was brought to the emergency department at [facility]. [facility] where he was evaluated by the emergency room physician and Rob Hickman, M.D., of hand surgery was consulted.  The bleeding is now being controlled.  The patient had a wrist block at the medial nerve by the emergency department physician.  The patient reports a decreased sensation in his left index finger.  His last tetanus shot is unknown.  His last p.o. intake was at breakfast at 7:30 a.m. this morning and then a small amount of water at 11 a.m.  REVIEW OF SYSTEMS:  He denies any recent fever or chills.  No diplopia, blurred vision, or headaches.  No rhinorrhea, sore throat, or earaches.  No chest pain, shortness of breath, or cough.  No abdominal pain, nausea, vomiting, diarrhea, or constipation.  No melena or bright red blood per rectum.  No urinary frequency, hematuria, or dysuria.  No numbness or tingling of his extremities with the exception of the left hand.  Also of note, he was recently treated for a gout flare up in his right foot.  PAST MEDICAL  HISTORY:  Significant for diet-controlled diabetes and for gout. He denies hypertension and heart, lung, kidney, or ulcer disease.  No strokes, seizures, or cancer.  PAST SURGICAL HISTORY:  None.  FAMILY HISTORY:  Significant for diabetes and cancer in his family.  Denies any family history of coronary artery disease, sickle cell disease, or anesthesia problems or reactions.  ALLERGIES:  No known drug allergies.  CURRENT MEDICATIONS:  None.  SOCIAL HISTORY:  He is married and has nine children.  He denies tobacco or alcohol use.  He works in Architect.  Darrick Penna. Linna Darner, M.D., is his medical physician.  PHYSICAL EXAMINATION:  For vitals, please see our record.  GENERAL APPEARANCE:  He is a well-developed, well-nourished male in no acute distress.  HEENT:  The head is normocephalic and atraumatic.  The pupils are equal, round, and reactive to light.  Extraocular movements are grossly intact.  The oropharynx is clear without redness, exudates, or lesions.  NECK:  Supple with no cervical lymphadenopathy.  CHEST:  Clear to auscultation bilaterally with no wheezes or crackles.  HEART:  Regular rate and rhythm with no murmurs, rubs, or gallops.  ABDOMEN:  Soft, nontender, and nondistended with active bowel sounds.  BREASTS:  Not examined, not pertinent to present illness.  GENITOURINARY:  Not examined, not pertinent to present illness.  EXTREMITIES:  On the left hand examination, he has a transverse ragged laceration over the palmar aspect at the level of the proximal phalanx.  There is exposed flexor tendon.  His capillary refill is approximately two seconds distal to his injury.  He has decreased two-point discrimination in that finger questionable secondary to injury versus the block.  The finger appears stable in that there is no crepitation to the finger.  The left ring finger demonstrates a small longitudinal laceration to the pulp of the finger.  There is no nailbed  involvement.  The capillary is less than two seconds in this finger.  Sensation appears grossly intact in this finger.  RADIOGRAPHIC STUDIES:  X-rays of the left hand demonstrate a comminuted intra-articular fracture of the left index finger and proximal phalanx extending to the PIP joint.  There is no fracture evident to the left ring finger.  ASSESSMENT: 1. Open fracture/lacerations of left index finger and left ring finger. 2. Diet-controlled diabetes. 3. Gout.  PLAN:  The patient will be admitted and kept NPO.  I discussed the case with Laurelyn Sickle III, M.D., who will be evaluating the patient shortly.  Will obtain consent for an irrigation and debridement and repair of structures as indicated, including bone, tendon, nerve, artery, and soft tissue structures. He has received a dose of IV Ancef and a tetanus update.  All questions have been encouraged and answered for the patient.  A moist sterile dressing has been applied in the interval until seeing Rob Hickman, M.D. Dictated by:   Duncan Dull Troncale, P.A.C. Attending Physician:  Wynetta Fines DD:  03/31/01 TD:  03/31/01 Job: [id number]

## 2010-05-23 NOTE — Procedures (Signed)
[place]                            ABDOMINAL ULTRASOUND REPORT   NAME:[name] [name]                        MRN:          [id number]  DATE:08/07/2005                            DOB:          1955/08/31    ACCESSION NO. [id number]   READING PHYSICIAN:  Lowella Bandy. Olevia Perches, MD   INDICATIONS:  Abnormal liver enzymes.   PROCEDURE:  Multiplanar abdominal ultrasound imaging was performed in the  upright, supine, right and left lateral decubitus positions.   RESULTS:  Abdominal aorta 17 mm.  The IVC is patent.   The pancreas with suboptimal visualization of the head and tail.  The body  of the pancreas appears normal.   Gallbladder is well distended, thin walled, with no pericholecystic fluid or  intraluminal echogenic foci to suggest gallstone disease.   The common bile duct measures 4.9 mm in maximal diameter without evidence of  intraluminal foci.   The liver with homogenous increase in echodensity throughout the liver, mild  hepatomegaly, no focal lesions, no dilated duct.   Kidneys are normal in appearance.  Right kidney 86 mm, left 105 mm.   Spleen is normal in size, measuring 91 mm without parenchymal lesion.   IMPRESSION:  1.  Increased liver echodensity, rule out parenchymal disease.  Mild      hepatomegaly without splenomegaly.  No evidence of portal hypertension.  2.  Suboptimal visualization of the head and tail of the pancreas.                                  Lowella Bandy. Olevia Perches, MD   DMB/MedQ  DD:  08/10/2005  DT:  08/11/2005  Job #:  [id number]   cc:   Milus Banister, MD

## 2010-06-04 ENCOUNTER — Other Ambulatory Visit: Payer: Self-pay | Admitting: Internal Medicine

## 2010-06-04 NOTE — Telephone Encounter (Signed)
Due for Uric Acid level 274.9

## 2010-06-12 ENCOUNTER — Encounter: Payer: Self-pay | Admitting: Internal Medicine

## 2010-06-12 ENCOUNTER — Ambulatory Visit (INDEPENDENT_AMBULATORY_CARE_PROVIDER_SITE_OTHER): Payer: 59 | Admitting: Internal Medicine

## 2010-06-12 DIAGNOSIS — E119 Type 2 diabetes mellitus without complications: Secondary | ICD-10-CM

## 2010-06-12 DIAGNOSIS — G4733 Obstructive sleep apnea (adult) (pediatric): Secondary | ICD-10-CM

## 2010-06-12 DIAGNOSIS — M109 Gout, unspecified: Secondary | ICD-10-CM

## 2010-06-12 DIAGNOSIS — E785 Hyperlipidemia, unspecified: Secondary | ICD-10-CM

## 2010-06-12 DIAGNOSIS — D6489 Other specified anemias: Secondary | ICD-10-CM

## 2010-06-12 DIAGNOSIS — E559 Vitamin D deficiency, unspecified: Secondary | ICD-10-CM

## 2010-06-12 LAB — CBC WITH DIFFERENTIAL/PLATELET
Basophils Absolute: 0 10*3/uL (ref 0.0–0.1)
Basophils Relative: 0.6 % (ref 0.0–3.0)
Eosinophils Absolute: 0.2 10*3/uL (ref 0.0–0.7)
Eosinophils Relative: 3.4 % (ref 0.0–5.0)
HCT: 36.2 % — ABNORMAL LOW (ref 39.0–52.0)
Hemoglobin: 11.9 g/dL — ABNORMAL LOW (ref 13.0–17.0)
Lymphocytes Relative: 24.3 % (ref 12.0–46.0)
Lymphs Abs: 1.2 10*3/uL (ref 0.7–4.0)
MCHC: 32.8 g/dL (ref 30.0–36.0)
MCV: 83.8 fl (ref 78.0–100.0)
Monocytes Absolute: 0.4 10*3/uL (ref 0.1–1.0)
Monocytes Relative: 8.4 % (ref 3.0–12.0)
Neutro Abs: 3 10*3/uL (ref 1.4–7.7)
Neutrophils Relative %: 63.3 % (ref 43.0–77.0)
Platelets: 235 10*3/uL (ref 150.0–400.0)
RBC: 4.32 Mil/uL (ref 4.22–5.81)
RDW: 14.2 % (ref 11.5–14.6)
WBC: 4.8 10*3/uL (ref 4.5–10.5)

## 2010-06-12 LAB — HEPATIC FUNCTION PANEL
ALT: 27 U/L (ref 0–53)
AST: 27 U/L (ref 0–37)
Albumin: 4.4 g/dL (ref 3.5–5.2)
Alkaline Phosphatase: 99 U/L (ref 39–117)
Bilirubin, Direct: 0.1 mg/dL (ref 0.0–0.3)
Total Bilirubin: 0.7 mg/dL (ref 0.3–1.2)
Total Protein: 7.8 g/dL (ref 6.0–8.3)

## 2010-06-12 LAB — URIC ACID: Uric Acid, Serum: 6.2 mg/dL (ref 4.0–7.8)

## 2010-06-12 LAB — MICROALBUMIN / CREATININE URINE RATIO
Creatinine,U: 136.6 mg/dL
Microalb Creat Ratio: 0.8 mg/g (ref 0.0–30.0)
Microalb, Ur: 1.1 mg/dL (ref 0.0–1.9)

## 2010-06-12 LAB — HEMOGLOBIN A1C: Hgb A1c MFr Bld: 7.4 % — ABNORMAL HIGH (ref 4.6–6.5)

## 2010-06-12 LAB — LIPID PANEL
Cholesterol: 149 mg/dL (ref 0–200)
HDL: 40 mg/dL (ref >39.00)
LDL Cholesterol: 95 mg/dL (ref 0–99)
Total CHOL/HDL Ratio: 4
Triglycerides: 68 mg/dL (ref 0.0–149.0)
VLDL: 13.6 mg/dL (ref 0.0–40.0)

## 2010-06-12 LAB — TSH: TSH: 1.33 u[IU]/mL (ref 0.35–5.50)

## 2010-06-12 NOTE — Progress Notes (Signed)
  Subjective:    Patient ID: [name], male    DOB: 06/19/1955, 55 y.o.   [name]  HPI Home DM program data reviewed    Review of Systems     Objective:   Physical Exam        Assessment & Plan:  See Problem List  with Orders & Recommendations ( Instructions)

## 2010-06-12 NOTE — Patient Instructions (Signed)
Diabetes Monitor    The A1c test is used primarily to monitor the glucose control of diabetics over time. The goal of those with diabetes is to keep their blood glucose levels as close to normal as possible. This helps to minimize the complications caused by chronically elevated glucose levels, such as progressive damage to body organs like the kidneys, eyes, cardiovascular system, and nerves. The A1c test gives a picture of the average amount of glucose in the blood over the last few months. It can help a patient and his doctor know if the measures they are taking to control the patient's diabetes are successful or need to be adjusted.    NORMAL VALUES  Non diabetic adults: 5 %-6.1%  Good diabetic control: 6.2-6.4 %  Fair diabetic control: 6.5-7%  Poor diabetic control: greater than 7 % ( except with additional factors such as  advanced age; significant coronary or neurologic disease,etc). Check the A1c every 6 months if it is < 6.5%; every 4 months if  6.5% or higher. Goals for home glucose monitoring are : fasting  or morning glucose goal of  90-150. Two hours after any meal , goal = < 180, preferably < 150.    Preventive Health Care: Exercise at least 30-45 minutes a day,  3-4 days a week.  Eat a low-fat diet with lots of fruits and vegetables, up to 7-9 servings per day. Avoid obesity; your goal is waist measurement < 40 inches.Consume less than 40 grams of sugar per day from foods & drinks with High Fructose Corn Sugar as #2,3 or # 4 on label. Eye Doctor - have an eye exam @ least annually.

## 2010-06-12 NOTE — Assessment & Plan Note (Signed)
As per Dr Halford Chessman

## 2010-06-12 NOTE — Progress Notes (Signed)
Subjective:    Patient ID: [name], male    DOB: [birth year], 55 y.o.   [name]  HPI Diabetes status assessment: Fasting or morning glucose range:  116-145 or average :  131  . Highest glucose 2 hours after any meal:  177, usually < 150. Averages 126. Low 85 Hypoglycemia :  no .                                                    Excess thirst ;hunger; urination:  no.                                  Lightheadedness with standing:  no. Chest pain: no; Palpitations :no ;  Pain in  calves with walking:no                                                                                                                          Non healing skin  ulcers or sores,especially over the feet:  no. Numbness or tingling or burning in feet : no .                                                                                                                                              Significant change in  Weight : stable. Vision changes : no  .                                                                    Exercise : yardwork . Nutrition/diet:  Low carb. Medication compliance : yes. Medication adverse  Effects:  no . Eye exam : appt 7/2. Foot care : no.  A1c/ urine microalbumin monitor:03/21/2010  A1c  7.7%  (average sugar 175, risk 54%)        Review of Systems     Objective:   Physical Exam Gen.: Healthy and well-nourished in appearance.  Alert, appropriate and cooperative throughout exam. Neck: No deformities, masses, or tenderness noted.  Thyroid  normal. Lungs: Normal respiratory effort; chest expands symmetrically. Lungs are clear to auscultation without rales, wheezes, or increased work of breathing. Heart: Slow rate and regular  rhythm. Normal S1 and S2. No gallop, click, or rub. No  murmur. No clubbing, cyanosis, edema, or deformity noted. Range of motion  normal .Tone & strength  normal.Joints normal. Nail health  good. Vascular: Carotid, radial artery, dorsalis pedis  and dorsalis posterior tibial pulses are full and equal. No bruits present. Neurologic: Alert and oriented x3. Deep tendon reflexes symmetrical and normal.  Light touch normal over feet         Skin: Intact without suspicious lesions or rashes. Lymph: No cervical, axillary, or inguinal lymphadenopathy present. Psych: Mood and affect are normal. Normally interactive                                                                                         Assessment & Plan:

## 2010-06-13 ENCOUNTER — Other Ambulatory Visit: Payer: Self-pay | Admitting: Internal Medicine

## 2010-06-13 LAB — VITAMIN D 25 HYDROXY (VIT D DEFICIENCY, FRACTURES): Vit D, 25-Hydroxy: 62 ng/mL (ref 30–89)

## 2010-06-13 NOTE — Telephone Encounter (Signed)
RX sent to pharmacy  

## 2010-06-24 ENCOUNTER — Encounter: Payer: 59 | Attending: Internal Medicine | Admitting: *Deleted

## 2010-06-24 DIAGNOSIS — Z713 Dietary counseling and surveillance: Secondary | ICD-10-CM | POA: Insufficient documentation

## 2010-06-24 DIAGNOSIS — E119 Type 2 diabetes mellitus without complications: Secondary | ICD-10-CM | POA: Insufficient documentation

## 2010-06-24 DIAGNOSIS — I10 Essential (primary) hypertension: Secondary | ICD-10-CM | POA: Insufficient documentation

## 2010-08-15 ENCOUNTER — Other Ambulatory Visit: Payer: Self-pay | Admitting: Internal Medicine

## 2010-08-19 ENCOUNTER — Other Ambulatory Visit: Payer: Self-pay | Admitting: Internal Medicine

## 2010-08-19 MED ORDER — INSULIN GLARGINE 100 UNIT/ML ~~LOC~~ SOLN: 27.0000 [IU] | Freq: Every day | SUBCUTANEOUS | Status: DC

## 2010-08-19 NOTE — Telephone Encounter (Signed)
Patient aware rx sent to pharmacy.  

## 2010-09-04 ENCOUNTER — Other Ambulatory Visit: Payer: Self-pay | Admitting: Internal Medicine

## 2010-09-10 ENCOUNTER — Other Ambulatory Visit: Payer: Self-pay | Admitting: Internal Medicine

## 2010-09-10 MED ORDER — LANCETS MISC: Status: DC

## 2010-09-10 MED ORDER — INSULIN PEN NEEDLE 31G X 8 MM MISC: Status: DC

## 2010-09-10 NOTE — Telephone Encounter (Signed)
rxs sent to pharmacy

## 2010-09-10 NOTE — Telephone Encounter (Signed)
Also needs needles for insulin pen--out of both items--please fill as soon as possible

## 2010-09-29 ENCOUNTER — Other Ambulatory Visit: Payer: Self-pay | Admitting: Internal Medicine

## 2010-10-13 ENCOUNTER — Other Ambulatory Visit: Payer: Self-pay | Admitting: Internal Medicine

## 2010-12-10 ENCOUNTER — Ambulatory Visit (INDEPENDENT_AMBULATORY_CARE_PROVIDER_SITE_OTHER): Payer: 59

## 2010-12-10 DIAGNOSIS — Z23 Encounter for immunization: Secondary | ICD-10-CM

## 2010-12-15 ENCOUNTER — Other Ambulatory Visit: Payer: Self-pay | Admitting: Internal Medicine

## 2011-02-09 ENCOUNTER — Ambulatory Visit (INDEPENDENT_AMBULATORY_CARE_PROVIDER_SITE_OTHER): Payer: 59 | Admitting: Internal Medicine

## 2011-02-09 ENCOUNTER — Encounter: Payer: Self-pay | Admitting: Internal Medicine

## 2011-02-09 VITALS — BP 124/82 | HR 62 | Temp 97.7°F | Resp 12 | Ht 72.25 in | Wt 268.4 lb

## 2011-02-09 DIAGNOSIS — E785 Hyperlipidemia, unspecified: Secondary | ICD-10-CM

## 2011-02-09 DIAGNOSIS — E119 Type 2 diabetes mellitus without complications: Secondary | ICD-10-CM

## 2011-02-09 DIAGNOSIS — E559 Vitamin D deficiency, unspecified: Secondary | ICD-10-CM

## 2011-02-09 DIAGNOSIS — Z0000 Encounter for general adult medical examination without abnormal findings: Secondary | ICD-10-CM

## 2011-02-09 DIAGNOSIS — M109 Gout, unspecified: Secondary | ICD-10-CM

## 2011-02-09 LAB — BASIC METABOLIC PANEL
BUN: 23 mg/dL (ref 6–23)
CO2: 26 mEq/L (ref 19–32)
Calcium: 9.7 mg/dL (ref 8.4–10.5)
Chloride: 104 mEq/L (ref 96–112)
Creatinine, Ser: 1.4 mg/dL (ref 0.4–1.5)
GFR: 65.74 mL/min (ref >60.00)
Glucose, Bld: 151 mg/dL — ABNORMAL HIGH (ref 70–99)
Potassium: 4.4 mEq/L (ref 3.5–5.1)
Sodium: 139 mEq/L (ref 135–145)

## 2011-02-09 LAB — CBC WITH DIFFERENTIAL/PLATELET
Basophils Absolute: 0 10*3/uL (ref 0.0–0.1)
Basophils Relative: 0.5 % (ref 0.0–3.0)
Eosinophils Absolute: 0.1 10*3/uL (ref 0.0–0.7)
Eosinophils Relative: 1.5 % (ref 0.0–5.0)
HCT: 40.2 % (ref 39.0–52.0)
Hemoglobin: 13.2 g/dL (ref 13.0–17.0)
Lymphocytes Relative: 28 % (ref 12.0–46.0)
Lymphs Abs: 1.5 10*3/uL (ref 0.7–4.0)
MCHC: 32.9 g/dL (ref 30.0–36.0)
MCV: 81.9 fl (ref 78.0–100.0)
Monocytes Absolute: 0.4 10*3/uL (ref 0.1–1.0)
Monocytes Relative: 8.1 % (ref 3.0–12.0)
Neutro Abs: 3.3 10*3/uL (ref 1.4–7.7)
Neutrophils Relative %: 61.9 % (ref 43.0–77.0)
Platelets: 241 10*3/uL (ref 150.0–400.0)
RBC: 4.91 Mil/uL (ref 4.22–5.81)
RDW: 14.6 % (ref 11.5–14.6)
WBC: 5.3 10*3/uL (ref 4.5–10.5)

## 2011-02-09 LAB — LIPID PANEL
Cholesterol: 155 mg/dL (ref 0–200)
HDL: 40.7 mg/dL (ref >39.00)
LDL Cholesterol: 100 mg/dL — ABNORMAL HIGH (ref 0–99)
Total CHOL/HDL Ratio: 4
Triglycerides: 73 mg/dL (ref 0.0–149.0)
VLDL: 14.6 mg/dL (ref 0.0–40.0)

## 2011-02-09 LAB — HEMOGLOBIN A1C: Hgb A1c MFr Bld: 8.2 % — ABNORMAL HIGH (ref 4.6–6.5)

## 2011-02-09 LAB — TSH: TSH: 0.71 u[IU]/mL (ref 0.35–5.50)

## 2011-02-09 LAB — URIC ACID: Uric Acid, Serum: 6.3 mg/dL (ref 4.0–7.8)

## 2011-02-09 NOTE — Patient Instructions (Signed)
Preventive Health Care: Exercise at least 30-45 minutes a day,  3-4 days a week.  Eye Doctor - have an eye exam @ least annually.                                                         Health Care Power of [place]. Complete if not in place ; these place you in charge of your health care decisions. Eat a low-fat diet with lots of fruits and vegetables, up to 7-9 servings per day. Consume less than 40 (preferably ZERO) grams of sugar per day from foods & drinks with High Fructose Corn Syrup (HFCS) sugar as #1,2,3 or # 4 on label.[employer], [employer] do not carry products with HFCS. Follow a  low carb nutrition program such as [place] or The New Sugar Busters  to prevent Diabetes progression . White carbohydrates (potatoes, rice, bread, and pasta) have a high spike of sugar and a high load of sugar. For example a  baked potato has a cup of sugar and a  french fry  2 teaspoons of sugar. Yams, wild  rice, whole grained bread &  wheat pasta have been much lower spike and load of  sugar. Portions should be the size of a deck of cards or your palm.

## 2011-02-09 NOTE — Progress Notes (Signed)
Subjective:    Patient ID: [name], male    DOB: 1955-05-25, 56 y.o.   [name]  HPI  [name] is here for a physical;acute issues include ongoing lumbar radiculopathy.      Review of Systems HYPERTENSION: Disease Monitoring: Blood pressure range-not checked Chest pain, palpitations- no       Dyspnea-no  Medications: Compliance- yes  Lightheadedness,Syncope- only with low sugars    Edema- no  DIABETES:on Lantus 36 units Disease Monitoring: Blood Sugar ranges-FBS 130-160;2 hrs post meal  150-200  Polyuria/phagia/dipsia-no      Visual problems-no Medications: Compliance- yes  Hypoglycemic symptoms- usually pre lunch 12:30 -1 pm , in 90s & also pre eve meal(supper) ,100-120  HYPERLIPIDEMIA: Disease Monitoring: See symptoms for Hypertension Medications: Compliance- yes  Abd pain, bowel changes- no   Muscle aches- no        Objective:   Physical Exam Gen.: Healthy and well-nourished in appearance. Alert, appropriate and cooperative throughout exam. Head: Normocephalic without obvious abnormalities; beard & moustache; head shaven Eyes: No corneal or conjunctival inflammation noted. Pupils equal round reactive to light and accommodation. Fundal exam is benign without hemorrhages, exudate, papilledema. Extraocular motion intact. Vision grossly normal with lenses. Ears: External  ear exam reveals no significant lesions or deformities. Canals clear .TMs normal. Hearing is grossly normal bilaterally. Nose: External nasal exam reveals no deformity or inflammation. Nasal mucosa are pink and moist. No lesions or exudates noted.  Mouth: Oral mucosa and oropharynx reveal no lesions or exudates. Teeth in good repair. Upper & lower partials Neck: No deformities, masses, or tenderness noted. Range of motion reduced slightly. Thyroid normal Lungs: Normal respiratory effort; chest expands symmetrically. Lungs are clear to auscultation without rales, wheezes, or increased work  of breathing. Heart: Normal rate and rhythm. Normal S1 and S2. No gallop, click, or rub. No  murmur. Abdomen: Bowel sounds normal; abdomen soft and nontender. No masses, organomegaly or hernias noted. Genitalia/ DRE: Genital exam is unremarkable. Prostate is small without nodularity, asymmetry or induration.                                                             Musculoskeletal/extremities: No deformity or scoliosis noted of  the thoracic or lumbar spine. No clubbing, cyanosis, edema noted. Range of motion  normal .Tone & strength  normal.Joints normal. Nail health  Good.Flexion contracture L index finger. Left second toenail chronically discolored. Pes planus is present. Vascular: Carotid, radial artery, dorsalis pedis and  posterior tibial pulses are full and equal. No bruits present. Neurologic: Alert and oriented x3. Deep tendon reflexes symmetrical and normal. Light touch normal over feet.      Skin: Intact without suspicious lesions or rashes. Lymph: No cervical, axillary, or inguinal lymphadenopathy present. Psych: Mood and affect are normal. Normally interactive  Assessment & Plan:  #1 comprehensive physical exam; no acute findings #2 see Problem List with Assessments & Recommendations Plan: see Orders

## 2011-02-10 LAB — MICROALBUMIN / CREATININE URINE RATIO
Creatinine,U: 92 mg/dL
Microalb Creat Ratio: 3.9 mg/g (ref 0.0–30.0)
Microalb, Ur: 3.6 mg/dL — ABNORMAL HIGH (ref 0.0–1.9)

## 2011-02-10 LAB — VITAMIN D 25 HYDROXY (VIT D DEFICIENCY, FRACTURES): Vit D, 25-Hydroxy: 79 ng/mL (ref 30–89)

## 2011-02-13 ENCOUNTER — Other Ambulatory Visit: Payer: Self-pay | Admitting: Internal Medicine

## 2011-02-13 DIAGNOSIS — E1165 Type 2 diabetes mellitus with hyperglycemia: Secondary | ICD-10-CM

## 2011-02-13 MED ORDER — INSULIN ASPART 100 UNIT/ML ~~LOC~~ SOLN: 6.0000 [IU] | Freq: Three times a day (TID) | SUBCUTANEOUS | Status: DC

## 2011-02-13 NOTE — Progress Notes (Signed)
Addended by: Logan Bores on: 02/13/2011 05:49 PM   Modules accepted: Orders

## 2011-03-18 ENCOUNTER — Telehealth: Payer: Self-pay | Admitting: Internal Medicine

## 2011-03-18 MED ORDER — GLUCOSE BLOOD VI STRP: ORAL_STRIP | Status: DC

## 2011-03-18 NOTE — Telephone Encounter (Signed)
Refill for  Truetest glucose test strp Qty 100 Last fill date 1.31.13 Test blood sugar twice a day

## 2011-03-18 NOTE — Telephone Encounter (Signed)
New RX sent

## 2011-04-10 ENCOUNTER — Other Ambulatory Visit: Payer: Self-pay | Admitting: Neurosurgery

## 2011-04-10 DIAGNOSIS — IMO0002 Reserved for concepts with insufficient information to code with codable children: Secondary | ICD-10-CM

## 2011-04-10 DIAGNOSIS — M5137 Other intervertebral disc degeneration, lumbosacral region: Secondary | ICD-10-CM

## 2011-04-10 DIAGNOSIS — M47817 Spondylosis without myelopathy or radiculopathy, lumbosacral region: Secondary | ICD-10-CM

## 2011-04-10 DIAGNOSIS — M545 Low back pain, unspecified: Secondary | ICD-10-CM

## 2011-04-16 ENCOUNTER — Ambulatory Visit (HOSPITAL_COMMUNITY)
Admission: RE | Admit: 2011-04-16 | Discharge: 2011-04-16 | Disposition: A | Discharge status: Home / Self Care | Payer: 59 | Source: Ambulatory Visit | Attending: Neurosurgery | Admitting: Neurosurgery

## 2011-04-16 DIAGNOSIS — M79609 Pain in unspecified limb: Secondary | ICD-10-CM | POA: Insufficient documentation

## 2011-04-16 DIAGNOSIS — M545 Low back pain, unspecified: Secondary | ICD-10-CM | POA: Insufficient documentation

## 2011-04-16 DIAGNOSIS — M51379 Other intervertebral disc degeneration, lumbosacral region without mention of lumbar back pain or lower extremity pain: Secondary | ICD-10-CM | POA: Insufficient documentation

## 2011-04-16 DIAGNOSIS — M5137 Other intervertebral disc degeneration, lumbosacral region: Secondary | ICD-10-CM

## 2011-04-16 DIAGNOSIS — M47817 Spondylosis without myelopathy or radiculopathy, lumbosacral region: Secondary | ICD-10-CM | POA: Insufficient documentation

## 2011-04-16 DIAGNOSIS — IMO0002 Reserved for concepts with insufficient information to code with codable children: Secondary | ICD-10-CM | POA: Insufficient documentation

## 2011-04-16 LAB — CREATININE, SERUM
Creatinine, Ser: 1.48 mg/dL — ABNORMAL HIGH (ref 0.50–1.35)
GFR calc Af Amer: 59 mL/min — ABNORMAL LOW (ref >90)
GFR calc non Af Amer: 51 mL/min — ABNORMAL LOW (ref >90)

## 2011-04-16 LAB — BUN: BUN: 19 mg/dL (ref 6–23)

## 2011-05-18 ENCOUNTER — Other Ambulatory Visit: Payer: Self-pay | Admitting: Internal Medicine

## 2011-05-25 ENCOUNTER — Other Ambulatory Visit: Payer: Self-pay | Admitting: Internal Medicine

## 2011-06-08 ENCOUNTER — Other Ambulatory Visit: Payer: Self-pay | Admitting: Internal Medicine

## 2011-06-16 ENCOUNTER — Ambulatory Visit (INDEPENDENT_AMBULATORY_CARE_PROVIDER_SITE_OTHER): Payer: 59 | Admitting: Internal Medicine

## 2011-06-16 ENCOUNTER — Encounter: Payer: Self-pay | Admitting: Internal Medicine

## 2011-06-16 VITALS — BP 110/66 | HR 70 | Temp 98.2°F | Ht 72.25 in | Wt 267.0 lb

## 2011-06-16 DIAGNOSIS — H6980 Other specified disorders of Eustachian tube, unspecified ear: Secondary | ICD-10-CM

## 2011-06-16 MED ORDER — FLUTICASONE PROPIONATE 50 MCG/ACT NA SUSP: 1.0000 | Freq: Two times a day (BID) | NASAL | Status: DC | PRN

## 2011-06-16 NOTE — Progress Notes (Signed)
  Subjective:    Patient ID: [name], male    DOB: [birth year], 55 y.o.   MRN: [id number]  HPI 2-3 weeks ago he began to experience pressure in the left [place] he describes constant pressure and pain in that ear without discharge. He has not used any therapy for this and it has progressed this week.    Review of Systems he has not had frontal headaches, facial pain, nasal purulence, dental pain, or sore throat . He also denies fever, chills, or sweats. He also has not had any cough or sputum production                                                       Objective:   Physical Exam General appearance:good health ;well nourished; no acute distress or increased work of breathing is present.  No  lymphadenopathy about the head, neck, or axilla noted.   Eyes: No conjunctival inflammation or lid edema is present.   Ears:  External ear exam shows no significant lesions or deformities.  Otoscopic examination reveals clear canals, tympanic membranes are intact bilaterally without bulging, retraction, inflammation or discharge.  Nose:  External nasal examination shows no deformity or inflammation. Nasal mucosa are pink and moist without lesions or exudates. No septal dislocation or deviation.No obstruction to airflow.   Oral exam: Dental hygiene is good; lips and gums are healthy appearing.he has upper and lower partials. There is no oropharyngeal erythema or exudate noted. There is no evidence clinically of TMJ  Neck:  No deformities, thyromegaly, masses, or tenderness noted.   Supple with full range of motion without pain.    Skin: Warm & dry w/o jaundice or tenting.           Assessment & Plan:  #1 eustachian tube dysfunction related to baropressure; no evidence of otitis media  Plan: See orders and recommendations

## 2011-06-16 NOTE — Patient Instructions (Addendum)
Go to [facility] for eustachian tube dysfunction. Drink thin  fluids liberally through the day and chew sugarless gum . Do the Valsalva maneuver several times a day to "pop" ears open. Flonase 1 spray in each nostril twice a day as needed. Use the "crossover" technique as discussed. Use a Neti pot daily- 2x /day  as needed for sinus congestion  

## 2011-07-28 ENCOUNTER — Other Ambulatory Visit: Payer: Self-pay | Admitting: Internal Medicine

## 2011-09-21 ENCOUNTER — Other Ambulatory Visit: Payer: Self-pay | Admitting: Internal Medicine

## 2011-09-21 NOTE — Telephone Encounter (Signed)
A1C 250.00 

## 2011-10-05 ENCOUNTER — Other Ambulatory Visit: Payer: Self-pay | Admitting: Internal Medicine

## 2011-10-29 ENCOUNTER — Encounter: Payer: Self-pay | Admitting: Internal Medicine

## 2011-10-29 ENCOUNTER — Ambulatory Visit (INDEPENDENT_AMBULATORY_CARE_PROVIDER_SITE_OTHER): Payer: 59 | Admitting: Internal Medicine

## 2011-10-29 VITALS — BP 116/74 | HR 85 | Temp 98.2°F | Wt 235.8 lb

## 2011-10-29 DIAGNOSIS — E119 Type 2 diabetes mellitus without complications: Secondary | ICD-10-CM

## 2011-10-29 MED ORDER — LANCETS MISC: Status: DC

## 2011-10-29 NOTE — Assessment & Plan Note (Signed)
Diabetic control on low-dose of Lantus. Recurrent hypoglycemic episodes.  Insulin be discontinued totally. An A1c should be done in 6 weeks to assess need for oral therapy.

## 2011-10-29 NOTE — Assessment & Plan Note (Signed)
>>  ASSESSMENT AND PLAN FOR DIABETES MELLITUS WITH RENAL MANIFESTATION ([facility]) WRITTEN ON 10/29/2011  5:23 PM BY HOPPER, Titus Dubin, MD  Diabetic control on low-dose of Lantus. Recurrent hypoglycemic episodes.  Insulin be discontinued totally. An A1c should be done in 6 weeks to assess need for oral therapy.

## 2011-10-29 NOTE — Progress Notes (Signed)
  Subjective:    Patient ID: [name], male    DOB: Jan 27, 1955, 56 y.o.   [name]  HPI  He has been aggressively pursuing a nutritional and exercise program; with this he has lost 35 pounds over the last 3 months. His exercise and 60-90 minutes 4 times a week. With exercise his blood sugars have dropped into the 60s  He is presently on Lantus 15 units daily. His fasting blood sugars range 70 to-99. 2 hours after meal values are 81-107.  Pharmacist has recommended he discontinue his Lantus and go on an oral agent.  A1c was 6.3 today    Review of Systems  He denies polydipsia, polyphagia, polyuria. He has no nonhealing skin lesions. He also denies numbness or tingling or burning in his extremities.     Objective:   Physical Exam Gen.: Healthy and well-nourished in appearance. Alert, appropriate and cooperative throughout exam. Eyes: No corneal or conjunctival inflammation noted.  Lungs: Normal respiratory effort; chest expands symmetrically. Lungs are clear to auscultation without rales, wheezes, or increased work of breathing. Heart: Normal rate and rhythm. Normal S1 and S2. No gallop, click, or rub. S4 w/o murmur.                                                                   Musculoskeletal/extremities:  No clubbing, cyanosis,or  Edema. Flexion  deformity noted L index finger. Nail health  good. Vascular: Carotid, radial artery, dorsalis pedis and  posterior tibial pulses are full and equal. No bruits present. Neurologic: Alert and oriented x3.  Skin: Intact without suspicious lesions or rashes.  Psych: Mood and affect are normal. Normally interactive                                                                                         Assessment & Plan:

## 2011-10-29 NOTE — Patient Instructions (Addendum)
A1c assesses average 24 hour  glucose over prior 6-12 weeks. A1c GOALS:  Good diabetic control: 6.5-7 %  Fair diabetic control: 7-8 %  Poor diabetic control: greater than 8 % ( except with additional factors such as  advanced age; significant coronary or neurologic disease,etc).  STOP INSULIN;reheck the A1c in 6 weeks. Goals for home glucose monitoring are : fasting  or morning glucose goal of  100-150. Two hours after any meal , goal = < 180, preferably < 160. Report any low blood glucoses immediately.

## 2011-11-27 ENCOUNTER — Encounter (HOSPITAL_COMMUNITY): Payer: Self-pay | Admitting: Emergency Medicine

## 2011-11-27 ENCOUNTER — Telehealth: Payer: Self-pay | Admitting: Internal Medicine

## 2011-11-27 ENCOUNTER — Emergency Department (HOSPITAL_COMMUNITY)
Admission: EM | Admit: 2011-11-27 | Discharge: 2011-11-27 | Disposition: A | Discharge status: Home / Self Care | Payer: 59 | Source: Home / Self Care | Attending: Emergency Medicine | Admitting: Emergency Medicine

## 2011-11-27 DIAGNOSIS — R51 Headache: Secondary | ICD-10-CM

## 2011-11-27 DIAGNOSIS — R5381 Other malaise: Secondary | ICD-10-CM

## 2011-11-27 DIAGNOSIS — R531 Weakness: Secondary | ICD-10-CM

## 2011-11-27 DIAGNOSIS — R5383 Other fatigue: Secondary | ICD-10-CM

## 2011-11-27 LAB — POCT I-STAT, CHEM 8
BUN: 28 mg/dL — ABNORMAL HIGH (ref 6–23)
Calcium, Ion: 1.23 mmol/L (ref 1.12–1.23)
Chloride: 107 mEq/L (ref 96–112)
Creatinine, Ser: 1.8 mg/dL — ABNORMAL HIGH (ref 0.50–1.35)
Glucose, Bld: 86 mg/dL (ref 70–99)
HCT: 38 % — ABNORMAL LOW (ref 39.0–52.0)
Hemoglobin: 12.9 g/dL — ABNORMAL LOW (ref 13.0–17.0)
Potassium: 4.4 mEq/L (ref 3.5–5.1)
Sodium: 141 mEq/L (ref 135–145)
TCO2: 25 mmol/L (ref 0–100)

## 2011-11-27 LAB — POCT URINALYSIS DIP (DEVICE)
Bilirubin Urine: NEGATIVE
Glucose, UA: NEGATIVE mg/dL
Hgb urine dipstick: NEGATIVE
Ketones, ur: NEGATIVE mg/dL
Leukocytes, UA: NEGATIVE
Nitrite: NEGATIVE
Protein, ur: NEGATIVE mg/dL
Specific Gravity, Urine: 1.015 (ref 1.005–1.030)
Urobilinogen, UA: 0.2 mg/dL (ref 0.0–1.0)
pH: 6 (ref 5.0–8.0)

## 2011-11-27 MED ORDER — TAB-A-VITE/IRON PO TABS: 1.0000 | ORAL_TABLET | Freq: Every day | ORAL | Status: AC

## 2011-11-27 NOTE — Telephone Encounter (Signed)
Emergent call from wife/[name] regarding husband too dizzy or weak to stand or walk.  Screener  Advise to call 911 based on symptoms then forward call to RN.  RN unable to reach [place] at call back.  Left message to call for assistance, if still needed.

## 2011-11-27 NOTE — Telephone Encounter (Signed)
DUPLICATE NOTE, I already spoke with patient's wife to confirm patient seen in ER or Urgent Care. Patient was in route to Urgent Care at the time of call. I reviewed epic and patient is currently at Urgent Care

## 2011-11-27 NOTE — Telephone Encounter (Signed)
Patient Information:  Caller Name: Carlyon Shadow  Phone: [phone]  Patient: [name], [name]  Gender: Male  DOB: 04/14/1955  Age: 56 Years  PCP: Unice Cobble   Symptoms  Reason For Call & Symptoms: Weakness and lethargy while driving.  Onset: 1430.  BS 89,  BP was 70/60.  Reviewed Health History In EMR: Yes  Reviewed Medications In EMR: Yes  Reviewed Allergies In EMR: Yes  Date of Onset of Symptoms: 10/28/2011  Treatments Tried: drank caffeine  Treatments Tried Worked: No  Guideline(s) Used:  Weakness (Generalized) and Fatigue  Disposition Per Guideline:   Go to ED Now (or to Office with PCP Approval)  Reason For Disposition Reached:   Patient sounds very sick or weak to the triager  Advice Given:  Reassurance  Weakness often accompanies viral illnesses (e.g., colds and flu).  Call Back If:  Unable to stand or walk  Passes out  Breathing difficulty occurs  You become worse.  Office Follow Up:  Does the office need to follow up with this patient?: No  Instructions For The Office: N/A    RN Note: Home now, sitting in chair.  Ambulatory but remains weak. Reports on weight loss program.  Diabetic. Random blood sugar 89.  Did not eat lunch due to felt poorly with headache since 1000.  Became very weak and drowsy while driving home approximately 1430. Hypotensive. No improvment with caffeine beverage. Able to stand without assistance but still feels weak. No appointments remaining. Advised ED now. Patient agreed to go to ED now

## 2011-11-27 NOTE — ED Notes (Signed)
Pt c/o low blood pressure since 10:00 this morning... States that he checked it and it was 97/66... Sx include: headache, dizziness, weakness... Denies: fevers, vomiting, nauseas, diarrhea, SOB, chest pains, blurry vision... Pt has lost 45 lbs in a period of 4 months... His sugar this am was 89... Pt is alert w/no signs of distress.

## 2011-11-27 NOTE — ED Provider Notes (Signed)
History     CSN: [id number]  Arrival date & time 11/27/11  1625   None     Chief Complaint  Patient presents with  . Blood Pressure Check    (Consider location/radiation/quality/duration/timing/severity/associated sxs/prior treatment) Patient is a 56 y.o. male presenting with weakness. The history is provided by the patient and the spouse.  Weakness The primary symptoms include headaches and dizziness. Primary symptoms do not include visual change, paresthesias, nausea or vomiting. The symptoms began 6 to 12 hours ago. The symptoms are waxing and waning.  The pain from the headache is at a severity of 3/10. Location/region(s) of the headache: temporal. The headache is associated with weakness. The headache is not associated with aura, photophobia, eye pain, visual change, neck stiffness, paresthesias or loss of balance.  He describes the dizziness as lightheadedness. The dizziness began today. The dizziness has been resolved since its onset. It is a new problem. The dizziness is associated with hunger. Dizziness also occurs with weakness. Dizziness does not occur with tinnitus, nausea, vomiting or diaphoresis.  Additional symptoms include weakness. Additional symptoms do not include neck stiffness, pain, lower back pain, leg pain, loss of balance, photophobia, aura, hallucinations, nystagmus, taste disturbance, hyperacusis, hearing loss, tinnitus, vertigo, anxiety, irritability or dysphoric mood. Medical issues also include diabetes and hypertension. Associated medical issues comments: CBG at home noted to be in 80-90's.  reports he has been on a low carb, high protein diet for the past 4 months.  has lost 45lbs in that time frame.  .    Past Medical History  Diagnosis Date  . Hyperlipidemia   . Hypertension   . Diabetes mellitus   . GERD (gastroesophageal reflux disease)   . Gout   . Hepatic steatosis     Past Surgical History  Procedure Date  . 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 Cervical fusion     Dr Sherwood Gambler  . Lumbar fusion      Dr Sherwood Gambler  . Esophageal dilation 2007  . Colonoscopy 2007    Family History  Problem Relation Age of Onset  . Heart disease Father     pacer  . Urolithiasis Father   . Aneurysm Mother 33    CNS aneurysm  . Diabetes Sister   . Cancer Maternal Uncle     ? primary  . Cancer Paternal Uncle     ? primary    History  Substance Use Topics  . Smoking status: Former Smoker    Quit date: 01/06/1976  . Smokeless tobacco: Never Used     Comment: smoked 35 years ago as of 2013   . Alcohol Use: No      Review of Systems  Constitutional: Positive for fatigue. Negative for diaphoresis and irritability.  HENT: Negative for hearing loss, neck stiffness and tinnitus.   Eyes: Negative for photophobia and pain.  Gastrointestinal: Negative for nausea and vomiting.  Neurological: Positive for dizziness, weakness and headaches. Negative for vertigo, paresthesias and loss of balance.  Psychiatric/Behavioral: Negative for hallucinations and dysphoric mood.    Allergies  Review of patient's allergies indicates no known allergies.  Home Medications   Current Outpatient Rx  Name  Route  Sig  Dispense  Refill  . ALLOPURINOL 300 MG PO TABS               . AMLODIPINE BESYLATE 10 MG PO TABS      TAKE 1 TABLET BY MOUTH DAILY  90 tablet   1   . ASPIRIN 81 MG PO TABS   Oral   Take 81 mg by mouth daily.           . ATORVASTATIN CALCIUM 20 MG PO TABS      TAKE 1 TABLET BY MOUTH DAILY (IN PLACE OF CRESTOR)   90 tablet   0   . BENAZEPRIL HCL 20 MG PO TABS      TAKE 1 TABLET BY MOUTH DAILY   90 tablet   1   . VITAMIN D3 1000 UNITS PO CAPS   Oral   Take 1,000 Units by mouth daily.           Marland Kitchen NEXIUM 40 MG PO CPDR      TAKE 1 CAPSULE BY MOUTH ONCE DAILY AS NEEDED   90 capsule   2   . FISH OIL 1000 MG PO CAPS   Oral   Take 1,000 mg by mouth daily.           Marland Kitchen  POLYETHYLENE GLYCOL 3350 PO POWD      USE AS DIRECTED   850 g   11   . PREGABALIN 75 MG PO CAPS   Oral   Take 75 mg by mouth 2 (two) times daily.         Marland Kitchen VITAMIN D (ERGOCALCIFEROL) 50000 UNITS PO CAPS      TAKE 1 CAPSULE BY MOUTH ONCE A WEEK.   12 capsule   11   . TRUERESULT BLOOD GLUCOSE W/DEVICE KIT   Does not apply   by Does not apply route.          Marland Kitchen FLUTICASONE PROPIONATE 50 MCG/ACT NA SUSP   Nasal   Place 1 spray into the nose 2 (two) times daily as needed for rhinitis.   16 g   2   . INSULIN PEN NEEDLE 31G X 8 MM MISC      As directed   100 each   11   . LANCETS MISC      TrueTest Lancets: Check blood sugar 1-2 x daily   200 each   11   . FOCUSED MIND PO   Oral   Take by mouth.         . TAB-A-VITE/IRON PO TABS   Oral   Take 1 tablet by mouth daily.   30 tablet   0   . NON FORMULARY      Trueresult Test Strips: check blood sugar daily as directed            BP 107/75  Pulse 66  Temp 99.1 F (37.3 C) (Oral)  Resp 16  SpO2 100%  Physical Exam  Nursing note and vitals reviewed. Constitutional: He is oriented to person, place, and time. Vital signs are normal. He appears well-developed and well-nourished. He is active and cooperative.  HENT:  Head: Normocephalic.  Mouth/Throat: Oropharynx is clear and moist. No oropharyngeal exudate.  Eyes: Conjunctivae normal and EOM are normal. Pupils are equal, round, and reactive to light. No scleral icterus.  Neck: Trachea normal and normal range of motion. Neck supple. Carotid bruit is not present.  Cardiovascular: Normal rate, regular rhythm, normal heart sounds, intact distal pulses and normal pulses.   Pulmonary/Chest: Effort normal and breath sounds normal.  Abdominal: Soft. Bowel sounds are normal. There is no tenderness.  Lymphadenopathy:    He has no cervical adenopathy.  Neurological: He is alert and oriented to person, place, and time.  He has normal strength and normal reflexes. No  cranial nerve deficit or sensory deficit. Coordination and gait normal. GCS eye subscore is 4. GCS verbal subscore is 5. GCS motor subscore is 6.  Skin: Skin is warm and dry.  Psychiatric: He has a normal mood and affect. His speech is normal and behavior is normal. Judgment and thought content normal. Cognition and memory are normal.    ED Course  Procedures (including critical care time)  Labs Reviewed  POCT I-STAT, CHEM 8 - Abnormal; Notable for the following:    BUN 28 (*)     Creatinine, Ser 1.80 (*)     Hemoglobin 12.9 (*)     HCT 38.0 (*)     All other components within normal limits  POCT URINALYSIS DIP (DEVICE)   No results found.   1. Headache   2. Weakness       MDM  Increase fluid intake, decrease caffeinated beverages, multivitamin with iron, follow up at Dr. Linna Darner office early next week.         Awilda Metro, NP 11/27/11 1955

## 2011-11-27 NOTE — ED Provider Notes (Signed)
Medical screening examination/treatment/procedure(s) were performed by non-physician practitioner and as supervising physician I was immediately available for consultation/collaboration.  Philipp Deputy, M.D.   Harden Mo, MD 11/27/11 2245

## 2011-11-27 NOTE — Telephone Encounter (Signed)
I spoke with patient's wife and she was in route to [facility] with patient

## 2011-12-01 ENCOUNTER — Ambulatory Visit (INDEPENDENT_AMBULATORY_CARE_PROVIDER_SITE_OTHER): Payer: 59 | Admitting: Internal Medicine

## 2011-12-01 ENCOUNTER — Encounter: Payer: Self-pay | Admitting: Internal Medicine

## 2011-12-01 VITALS — BP 130/70 | HR 63 | Temp 97.8°F | Wt 223.6 lb

## 2011-12-01 DIAGNOSIS — N508 Other specified disorders of male genital organs: Secondary | ICD-10-CM

## 2011-12-01 DIAGNOSIS — I10 Essential (primary) hypertension: Secondary | ICD-10-CM

## 2011-12-01 DIAGNOSIS — D6489 Other specified anemias: Secondary | ICD-10-CM

## 2011-12-01 DIAGNOSIS — R5381 Other malaise: Secondary | ICD-10-CM

## 2011-12-01 DIAGNOSIS — N289 Disorder of kidney and ureter, unspecified: Secondary | ICD-10-CM

## 2011-12-01 DIAGNOSIS — E1159 Type 2 diabetes mellitus with other circulatory complications: Secondary | ICD-10-CM | POA: Insufficient documentation

## 2011-12-01 DIAGNOSIS — E119 Type 2 diabetes mellitus without complications: Secondary | ICD-10-CM

## 2011-12-01 DIAGNOSIS — N5089 Other specified disorders of the male genital organs: Secondary | ICD-10-CM

## 2011-12-01 DIAGNOSIS — I152 Hypertension secondary to endocrine disorders: Secondary | ICD-10-CM | POA: Insufficient documentation

## 2011-12-01 DIAGNOSIS — R5383 Other fatigue: Secondary | ICD-10-CM

## 2011-12-01 LAB — CBC WITH DIFFERENTIAL/PLATELET
Basophils Absolute: 0 10*3/uL (ref 0.0–0.1)
Basophils Relative: 0.6 % (ref 0.0–3.0)
Eosinophils Absolute: 0 10*3/uL (ref 0.0–0.7)
Eosinophils Relative: 1.2 % (ref 0.0–5.0)
HCT: 38.7 % — ABNORMAL LOW (ref 39.0–52.0)
Hemoglobin: 12.3 g/dL — ABNORMAL LOW (ref 13.0–17.0)
Lymphocytes Relative: 32.1 % (ref 12.0–46.0)
Lymphs Abs: 1.3 10*3/uL (ref 0.7–4.0)
MCHC: 31.9 g/dL (ref 30.0–36.0)
MCV: 83.5 fl (ref 78.0–100.0)
Monocytes Absolute: 0.3 10*3/uL (ref 0.1–1.0)
Monocytes Relative: 7.8 % (ref 3.0–12.0)
Neutro Abs: 2.3 10*3/uL (ref 1.4–7.7)
Neutrophils Relative %: 58.3 % (ref 43.0–77.0)
Platelets: 224 10*3/uL (ref 150.0–400.0)
RBC: 4.64 Mil/uL (ref 4.22–5.81)
RDW: 15.1 % — ABNORMAL HIGH (ref 11.5–14.6)
WBC: 4 10*3/uL — ABNORMAL LOW (ref 4.5–10.5)

## 2011-12-01 LAB — BASIC METABOLIC PANEL
BUN: 18 mg/dL (ref 6–23)
CO2: 28 mEq/L (ref 19–32)
Calcium: 9.6 mg/dL (ref 8.4–10.5)
Chloride: 102 mEq/L (ref 96–112)
Creatinine, Ser: 1.5 mg/dL (ref 0.4–1.5)
GFR: 64 mL/min (ref >60.00)
Glucose, Bld: 104 mg/dL — ABNORMAL HIGH (ref 70–99)
Potassium: 4.1 mEq/L (ref 3.5–5.1)
Sodium: 136 mEq/L (ref 135–145)

## 2011-12-01 LAB — IBC PANEL
Iron: 62 ug/dL (ref 42–165)
Saturation Ratios: 21.1 % (ref 20.0–50.0)
Transferrin: 209.9 mg/dL — ABNORMAL LOW (ref 212.0–360.0)

## 2011-12-01 LAB — HEMOGLOBIN A1C: Hgb A1c MFr Bld: 6.2 % (ref 4.6–6.5)

## 2011-12-01 LAB — MICROALBUMIN / CREATININE URINE RATIO
Creatinine,U: 27.9 mg/dL
Microalb Creat Ratio: 1.4 mg/g (ref 0.0–30.0)
Microalb, Ur: 0.4 mg/dL (ref 0.0–1.9)

## 2011-12-01 LAB — TSH: TSH: 1.25 u[IU]/mL (ref 0.35–5.50)

## 2011-12-01 LAB — VITAMIN B12: Vitamin B-12: 730 pg/mL (ref 211–911)

## 2011-12-01 NOTE — Patient Instructions (Addendum)
Please complete & return stool cards.  If you activate My Chart; the results can be released to you as soon as they populate from the lab. If you choose not to use this program; the labs have to be reviewed, copied & mailed   causing a delay in getting the results to you.

## 2011-12-01 NOTE — Progress Notes (Signed)
  Subjective:    Patient ID: [name], male    DOB: 11-Jul-1955, 56 y.o.   MRN: [id number]  HPI He is here in followup after being seen in urgent care 11/27/11 for headache and dizziness. Those records were reviewed. He presented with frontal headache and lightheadedness without vertigo. This is also associated with fatigue and difficulty focusing. He noted a blood sugar of 89 and blood pressure 97/66.  At the urgent care he was found to have a creatinine of 1.8 and hematocrit of 38. Multivitamin with iron was recommended  The headache resolved and has not   Past medical history/family history/social history were all reviewed and updated. Pertinent data: He has a history of anemia 2008. Colonoscopy was negative in 2007.  Creatinine has  ranged from 1.4-1.8 over the past 20 mos. Hematocrit has varied from 36.2-40.2.  His last A1c on record was 8.2% in February of this year.A1c was 6.3 % @ [facility] recently.He is not on Metformin      Review of Systems He denies epistaxis, hemoptysis, hematuria, melena, or rectal bleeding.He has no unexplained weight loss, dysphagia, or abdominal pain. He has no abnormal bruising or bleeding. He has no difficulty stopping bleeding with injury.   He has had a "knot" in the right scrotum for several years. He believes  past he has developed a second "knot" 1 week ago     Objective:   Physical Exam General appearance is one of good health and nourishment w/o distress.  Eyes: No conjunctival inflammation or scleral icterus is present.  Oral exam: Dental hygiene is good;upper & lower partials. Lips and gums are healthy appearing.There is no oropharyngeal erythema or exudate noted.    Heart:  Normal rate and regular rhythm. S1 and S2 normal without gallop, murmur, click, rub or other extra sounds     Lungs:Chest clear to auscultation; no wheezes, rhonchi,rales ,or rubs present.No increased work of breathing.   Abdomen: bowel sounds normal, soft  and non-tender without masses, organomegaly or hernias noted.  No guarding or rebound   Skin:Warm & dry.  Intact without suspicious lesions or rashes ; no tenting  Lymphatic: No lymphadenopathy is noted about the head, neck, axilla areas.   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

## 2011-12-02 ENCOUNTER — Encounter: Payer: Self-pay | Admitting: Internal Medicine

## 2011-12-02 ENCOUNTER — Ambulatory Visit (HOSPITAL_COMMUNITY)
Admission: RE | Admit: 2011-12-02 | Discharge: 2011-12-02 | Disposition: A | Discharge status: Home / Self Care | Payer: 59 | Source: Ambulatory Visit | Attending: Internal Medicine | Admitting: Internal Medicine

## 2011-12-02 DIAGNOSIS — N508 Other specified disorders of male genital organs: Secondary | ICD-10-CM | POA: Insufficient documentation

## 2011-12-02 DIAGNOSIS — I861 Scrotal varices: Secondary | ICD-10-CM | POA: Insufficient documentation

## 2011-12-02 DIAGNOSIS — N433 Hydrocele, unspecified: Secondary | ICD-10-CM | POA: Insufficient documentation

## 2011-12-02 DIAGNOSIS — N5089 Other specified disorders of the male genital organs: Secondary | ICD-10-CM

## 2011-12-22 ENCOUNTER — Other Ambulatory Visit: Payer: Self-pay | Admitting: Internal Medicine

## 2012-01-11 ENCOUNTER — Other Ambulatory Visit: Payer: Self-pay | Admitting: Internal Medicine

## 2012-01-29 ENCOUNTER — Ambulatory Visit (INDEPENDENT_AMBULATORY_CARE_PROVIDER_SITE_OTHER): Payer: Self-pay | Admitting: Family Medicine

## 2012-01-29 DIAGNOSIS — E119 Type 2 diabetes mellitus without complications: Secondary | ICD-10-CM

## 2012-01-29 NOTE — Progress Notes (Signed)
Patient presents today for 3 month DM follow up as part of the employee sponsored Link to [employer]. Medications and glucose readings have been reviewed. I have also discussed with patient lifestyle interventions such as diet and exercise. Details of this visit can be found in [facility] documenting program through [facility]). Patient has set a series of personal goals and will follow up in 3 months for further review of DM.

## 2012-02-01 ENCOUNTER — Other Ambulatory Visit: Payer: Self-pay | Admitting: Internal Medicine

## 2012-02-03 ENCOUNTER — Encounter: Payer: Self-pay | Admitting: Family Medicine

## 2012-02-03 NOTE — Progress Notes (Signed)
Patient ID: [name], male   DOB: [birth year], 57 y.o.   MRN: [id number] Reviewed: agree with pharmacist's documentation and management.

## 2012-02-04 ENCOUNTER — Telehealth: Payer: Self-pay

## 2012-02-04 NOTE — Telephone Encounter (Signed)
Manual fax received from [facility] outpatient pharmacy. Nexium will go from a 0.00 copay to 25.00. Alternatives include Pantoprazole 40 mg daily   Left message on VM for patient to return call when available

## 2012-02-05 MED ORDER — PANTOPRAZOLE SODIUM 40 MG PO TBEC: 40.0000 mg | DELAYED_RELEASE_TABLET | Freq: Every day | ORAL | Status: DC

## 2012-02-05 NOTE — Telephone Encounter (Signed)
Spoke with patient, patient states he would like to change to alternative.

## 2012-02-12 ENCOUNTER — Encounter: Payer: Self-pay | Admitting: Internal Medicine

## 2012-02-12 ENCOUNTER — Ambulatory Visit (INDEPENDENT_AMBULATORY_CARE_PROVIDER_SITE_OTHER): Payer: 59 | Admitting: Internal Medicine

## 2012-02-12 VITALS — BP 100/68 | HR 60 | Resp 14 | Ht 70.08 in | Wt 212.8 lb

## 2012-02-12 DIAGNOSIS — Z0000 Encounter for general adult medical examination without abnormal findings: Secondary | ICD-10-CM

## 2012-02-12 DIAGNOSIS — E119 Type 2 diabetes mellitus without complications: Secondary | ICD-10-CM

## 2012-02-12 LAB — PSA: PSA: 0.89 ng/mL (ref 0.10–4.00)

## 2012-02-12 LAB — HEPATIC FUNCTION PANEL
ALT: 22 U/L (ref 0–53)
AST: 29 U/L (ref 0–37)
Albumin: 4.4 g/dL (ref 3.5–5.2)
Alkaline Phosphatase: 88 U/L (ref 39–117)
Bilirubin, Direct: 0.2 mg/dL (ref 0.0–0.3)
Total Bilirubin: 0.7 mg/dL (ref 0.3–1.2)
Total Protein: 7.6 g/dL (ref 6.0–8.3)

## 2012-02-12 LAB — CBC WITH DIFFERENTIAL/PLATELET
Basophils Absolute: 0 10*3/uL (ref 0.0–0.1)
Basophils Relative: 0.9 % (ref 0.0–3.0)
Eosinophils Absolute: 0.1 10*3/uL (ref 0.0–0.7)
Eosinophils Relative: 1.4 % (ref 0.0–5.0)
HCT: 38.1 % — ABNORMAL LOW (ref 39.0–52.0)
Hemoglobin: 12.4 g/dL — ABNORMAL LOW (ref 13.0–17.0)
Lymphocytes Relative: 37.8 % (ref 12.0–46.0)
Lymphs Abs: 1.6 10*3/uL (ref 0.7–4.0)
MCHC: 32.6 g/dL (ref 30.0–36.0)
MCV: 82.7 fl (ref 78.0–100.0)
Monocytes Absolute: 0.3 10*3/uL (ref 0.1–1.0)
Monocytes Relative: 8.2 % (ref 3.0–12.0)
Neutro Abs: 2.2 10*3/uL (ref 1.4–7.7)
Neutrophils Relative %: 51.7 % (ref 43.0–77.0)
Platelets: 220 10*3/uL (ref 150.0–400.0)
RBC: 4.61 Mil/uL (ref 4.22–5.81)
RDW: 14.3 % (ref 11.5–14.6)
WBC: 4.2 10*3/uL — ABNORMAL LOW (ref 4.5–10.5)

## 2012-02-12 LAB — BASIC METABOLIC PANEL
BUN: 18 mg/dL (ref 6–23)
CO2: 30 mEq/L (ref 19–32)
Calcium: 9.6 mg/dL (ref 8.4–10.5)
Chloride: 104 mEq/L (ref 96–112)
Creatinine, Ser: 1.6 mg/dL — ABNORMAL HIGH (ref 0.4–1.5)
GFR: 59.68 mL/min — ABNORMAL LOW (ref >60.00)
Glucose, Bld: 97 mg/dL (ref 70–99)
Potassium: 4.4 mEq/L (ref 3.5–5.1)
Sodium: 140 mEq/L (ref 135–145)

## 2012-02-12 LAB — LIPID PANEL
Cholesterol: 99 mg/dL (ref 0–200)
HDL: 42.4 mg/dL (ref >39.00)
LDL Cholesterol: 50 mg/dL (ref 0–99)
Total CHOL/HDL Ratio: 2
Triglycerides: 32 mg/dL (ref 0.0–149.0)
VLDL: 6.4 mg/dL (ref 0.0–40.0)

## 2012-02-12 LAB — TSH: TSH: 0.8 u[IU]/mL (ref 0.35–5.50)

## 2012-02-12 LAB — URIC ACID: Uric Acid, Serum: 4.9 mg/dL (ref 4.0–7.8)

## 2012-02-12 MED ORDER — AMLODIPINE BESYLATE 10 MG PO TABS: 5.0000 mg | ORAL_TABLET | Freq: Every day | ORAL | Status: DC

## 2012-02-12 NOTE — Progress Notes (Signed)
Subjective:    Patient ID: [name], male    DOB: 1955-04-09, 57 y.o.   [name]  HPI  [name] is here for a physical;acute issues include recent Norovirus gastroenteritis.      Review of Systems In February 2013 his A1c was 8.2% in the context of steroid administrations for his neurosurgical issues. This required insulin administration. At this time he is on diet alone. Last month his A1c was 6.1%. With lifestyle change of diet and exercise; he has lost 58 pounds. His highest fasting blood sugar recently was 127 during the gastroenteritis. Subsequently it has not been higher than 107. He has had  rare hypoglycemia when away from home and unable to eat. He has no nonhealing skin lesions. He has no peripheral neuropathy symptoms. He does have some numbness and tingling in his soles sitting at times but this resolves when supine.He denies polyuria, polydipsia, polyphasia. Ophthalmologic followup is pending.  Blood pressure is averaging 100/60-70. He denies any lightheadedness or syncope. He denies chest pain, palpitations, dyspnea, claudication, or edema.  Past medical history/family history/social history were all reviewed and updated.       Objective:   Physical Exam Gen.: Healthy and well-nourished in appearance. Alert, appropriate and cooperative throughout exam. Appears younger than stated age  Head: Normocephalic without obvious abnormalities; beard & moustache . Head shaven.  Eyes: No corneal or conjunctival inflammation noted. Pupils equal round reactive to light and accommodation.  Extraocular motion intact. Vision grossly normal with lenses. Ears: External  ear exam reveals no significant lesions or deformities. Canals clear .TMs normal. Hearing is grossly normal bilaterally. Nose: External nasal exam reveals no deformity or inflammation. Nasal mucosa are pink and moist. No lesions or exudates noted.  Mouth: Oral mucosa and oropharynx reveal no lesions or exudates.  Teeth in good repair. Neck: No deformities, masses, or tenderness noted. Range of motion & Thyroid normal. Lungs: Normal respiratory effort; chest expands symmetrically. Lungs are clear to auscultation without rales, wheezes, or increased work of breathing. Heart: Slow rate and regular rhythm. Normal S1 and S2. No gallop, click, or rub. No murmur. Abdomen: Bowel sounds normal; abdomen soft and nontender. No masses, organomegaly or hernias noted. Genitalia: Genitalia normal . Prostate is  without enlargement, asymmetry,or  Nodularity. Slight induration  R lobe .                              Musculoskeletal/extremities: No deformity or scoliosis noted of  the thoracic or lumbar spine. No clubbing, cyanosis, edema, or significant extremity  deformity noted. Range of motion normal .Tone & strength  Normal.Flexion contraction L index finger. Nail health good. Able to lie down & sit up w/o help. Negative SLR bilaterally Vascular: Carotid, radial artery, dorsalis pedis and  posterior tibial pulses are full and equal. No bruits present. Neurologic: Alert and oriented x3. Deep tendon reflexes symmetrical and normal. Skin: Intact without suspicious lesions or rashes. Lymph: No cervical, axillary, or inguinal lymphadenopathy present. Psych: Mood and affect are normal. Normally interactive  Assessment & Plan:  #1 comprehensive physical exam; no acute findings  Plan: see Orders  & Recommendations

## 2012-02-12 NOTE — Patient Instructions (Addendum)
Review and correct the record as indicated. Please share record with all medical staff seen.  Decrease amlodipine 10 mg to one half pill daily and continue blood pressure monitor.Minimal Blood Pressure Goal= AVERAGE < 140/90;  Ideal is an AVERAGE < 135/85. This AVERAGE should be calculated from @ least 5-7 BP readings taken @ different times of day on different days of week. You should not respond to isolated BP readings , but rather the AVERAGE for that week .Please bring your  blood pressure cuff to office visits to verify that it is reliable.It  can also be checked against the blood pressure device at the pharmacy. Finger or wrist cuffs are not dependable; an arm cuff is.

## 2012-02-20 ENCOUNTER — Other Ambulatory Visit: Payer: Self-pay

## 2012-03-15 ENCOUNTER — Ambulatory Visit (INDEPENDENT_AMBULATORY_CARE_PROVIDER_SITE_OTHER): Payer: 59 | Admitting: Family Medicine

## 2012-03-15 ENCOUNTER — Encounter: Payer: Self-pay | Admitting: Family Medicine

## 2012-03-15 ENCOUNTER — Ambulatory Visit (HOSPITAL_BASED_OUTPATIENT_CLINIC_OR_DEPARTMENT_OTHER)
Admission: RE | Admit: 2012-03-15 | Discharge: 2012-03-15 | Disposition: A | Discharge status: Home / Self Care | Payer: 59 | Source: Ambulatory Visit | Attending: Family Medicine | Admitting: Family Medicine

## 2012-03-15 VITALS — BP 114/76 | HR 69 | Ht 71.0 in | Wt 210.0 lb

## 2012-03-15 DIAGNOSIS — M25572 Pain in left ankle and joints of left foot: Secondary | ICD-10-CM

## 2012-03-15 DIAGNOSIS — S8990XA Unspecified injury of unspecified lower leg, initial encounter: Secondary | ICD-10-CM | POA: Insufficient documentation

## 2012-03-15 DIAGNOSIS — M25579 Pain in unspecified ankle and joints of unspecified foot: Secondary | ICD-10-CM

## 2012-03-15 DIAGNOSIS — X58XXXA Exposure to other specified factors, initial encounter: Secondary | ICD-10-CM | POA: Insufficient documentation

## 2012-03-15 DIAGNOSIS — S99929A Unspecified injury of unspecified foot, initial encounter: Secondary | ICD-10-CM | POA: Insufficient documentation

## 2012-03-15 NOTE — Progress Notes (Signed)
Subjective:    Patient ID: [name], male    DOB: [birth year], 57 y.o.   [name]  PCP: Dr. Linna Darner  HPI 57 yo M here for left ankle injury.  Patient reports about 1 week ago he was standing on a ladder - went to step down and inverted left ankle. Able to bear weight following this. + swelling that has persisted. Most pain is lateral but having some medial pain. No prior ankle injuries. Taking ibuprofen.  Past Medical History  Diagnosis Date  . Hyperlipidemia   . Hypertension   . Diabetes mellitus   . GERD (gastroesophageal reflux disease)   . Gout   . Hepatic steatosis     Current Outpatient Prescriptions on File Prior to Visit  Medication Sig Dispense Refill  . allopurinol (ZYLOPRIM) 300 MG tablet Take 150 mg by mouth daily.       Marland Kitchen amLODipine (NORVASC) 10 MG tablet Take 0.5 tablets (5 mg total) by mouth daily.  90 tablet  2  . aspirin 81 MG tablet Take 81 mg by mouth daily.        Marland Kitchen atorvastatin (LIPITOR) 20 MG tablet TAKE 1 TABLET BY MOUTH DAILY (IN PLACE OF CRESTOR)  90 tablet  0  . benazepril (LOTENSIN) 20 MG tablet TAKE 1 TABLET BY MOUTH DAILY  90 tablet  2  . Blood Glucose Monitoring Suppl (TRUERESULT BLOOD GLUCOSE) W/DEVICE KIT by Does not apply route.       . Cholecalciferol (VITAMIN D3) 1000 UNITS CAPS Take 1,000 Units by mouth daily.        . Lancets MISC TrueTest Lancets: Check blood sugar 1-2 x daily  200 each  11  . Misc Natural Products (FOCUSED MIND PO) Take 6 tablets by mouth daily.       . Multiple Vitamins-Iron (MULTIVITAMINS WITH IRON) TABS Take 1 tablet by mouth daily.  30 tablet  0  . NON FORMULARY Trueresult Test Strips: check blood sugar daily as directed       . Omega-3 Fatty Acids (FISH OIL) 1000 MG CAPS Take 3,000 mg by mouth daily.       . pantoprazole (PROTONIX) 40 MG tablet Take 1 tablet (40 mg total) by mouth daily.  90 tablet  1  . polyethylene glycol powder (GLYCOLAX/MIRALAX) powder USE AS DIRECTED  850 g  11  . pregabalin  (LYRICA) 75 MG capsule Take 75 mg by mouth 2 (two) times daily.      . Vitamin D, Ergocalciferol, (DRISDOL) 50000 UNITS CAPS TAKE 1 CAPSULE BY MOUTH ONCE A WEEK.  12 capsule  11   No current facility-administered medications on file prior to visit.    Past Surgical History  Procedure Laterality Date  . 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 Cervical fusion      Dr Sherwood Gambler  . Lumbar fusion       Dr Sherwood Gambler  . Esophageal dilation  2007  . Colonoscopy  2007    negative; [facility]    No Known Allergies  History   Social History  . Marital Status: Married    Spouse Name: N/A    Number of Children: N/A  . Years of Education: N/A   Occupational History  . Not on file.   Social History Main Topics  . Smoking status: Former Smoker    Quit date: 01/06/1976  . Smokeless tobacco: Never Used     Comment: smoked 35 years ago as  of 2013   . Alcohol Use: No  . Drug Use: No  . Sexually Active: Not on file   Other Topics Concern  . Not on file   Social History Narrative  . No narrative on file    Family History  Problem Relation Age of Onset  . Heart disease Father     pacer  . Urolithiasis Father   . Heart attack Father   . Aneurysm Mother 55    CNS aneurysm  . Sudden death Mother   . Diabetes Sister   . Cancer Maternal Uncle     ? primary  . Cancer Paternal Uncle     ? primary  . Hyperlipidemia Neg Hx   . Hypertension Neg Hx     BP 114/76  Pulse 69  Ht 5\' 11"  (1.803 m)  Wt 210 lb (95.255 kg)  BMI 29.3 kg/m2  Review of Systems See HPI above.    Objective:   Physical Exam Gen: NAD  L ankle: Mild-mod swelling lateral > medial.  No other deformity, bruising. Mild limitation all motions but able to do so with mild pain. TTP greatest over ATFL, deltoid ligaments, less medial and lateral malleoli.  No navicular, base 5th, other foot/ankle TTP. 1+ talar tilt.  Negative ant drawer. Mild pain syndesmotic  compression. Thompsons test negative. NV intact distally.    Assessment & Plan:  1. Left ankle pain- radiographs negative for acute fracture.  2/2 grade 2 ankle sprain.  ASO with home exercise program shown.  Icing, nsaids, elevation.  F/u in 2 weeks for reevaluation.  Consider PT, adding strengthening exercises if improving.

## 2012-03-15 NOTE — Assessment & Plan Note (Signed)
radiographs negative for acute fracture.  2/2 grade 2 ankle sprain.  ASO with home exercise program shown.  Icing, nsaids, elevation.  F/u in 2 weeks for reevaluation.  Consider PT, adding strengthening exercises if improving.

## 2012-03-15 NOTE — Patient Instructions (Addendum)
You have an ankle sprain. Ice the area for 15 minutes at a time, 3-4 times a day Take aleve 1-2 tabs twice a day with food for 1 week then as needed. Elevate above the level of your heart when possible Crutches if needed to help with walking Bear weight when tolerated Depending on severity your doctor will place you in an ACE wrap with a walking boot OR a laceup ankle brace to help with stability while you recover from this injury. Come out of the boot/brace twice a day to do Up/down and alphabet exercises 2-3 sets of each. Consider physical therapy for strengthening and balance exercises. If not improving as expected, we may repeat x-rays or consider further testing like an MRI. Follow up with me in 2 weeks for reevaluation.

## 2012-03-21 ENCOUNTER — Other Ambulatory Visit: Payer: Self-pay | Admitting: Internal Medicine

## 2012-03-29 ENCOUNTER — Ambulatory Visit (INDEPENDENT_AMBULATORY_CARE_PROVIDER_SITE_OTHER): Payer: 59 | Admitting: Family Medicine

## 2012-03-29 ENCOUNTER — Encounter: Payer: Self-pay | Admitting: Family Medicine

## 2012-03-29 VITALS — BP 111/70 | HR 63 | Ht 71.0 in | Wt 210.0 lb

## 2012-03-29 DIAGNOSIS — M25579 Pain in unspecified ankle and joints of unspecified foot: Secondary | ICD-10-CM

## 2012-03-29 DIAGNOSIS — M25572 Pain in left ankle and joints of left foot: Secondary | ICD-10-CM

## 2012-03-29 NOTE — Assessment & Plan Note (Signed)
radiographs negative for acute fracture.  2/2 grade 2 ankle sprain.  Doing well with ASO and HEP.  Start strengthening exercises and balance exercises for next 4 weeks.  Wear ASO for support as well for 4 weeks. Icing, nsaids, elevation as needed.  F/u in 4 weeks or as needed.

## 2012-03-29 NOTE — Patient Instructions (Addendum)
You have an ankle sprain. Start strengthening exercises with theraband - 3 sets of 10 each direction.   Do the couple balance exercises I showed you once a day also. Ice the area for 15 minutes at a time, 3-4 times a day as needed. Take aleve 1-2 tabs twice a day with food as needed. Elevate above the level of your heart when possible Wear the laceup brace when you're on your feet for 4 more weeks. Follow up with me in 4 weeks or as needed.

## 2012-03-29 NOTE — Progress Notes (Signed)
Subjective:    Patient ID: [name], male    DOB: Oct 26, 1955, 57 y.o.   [name]  PCP: Dr. Linna Darner  HPI  57 yo M here for f/u left ankle injury.  3/11: Patient reports about 1 week ago he was standing on a ladder - went to step down and inverted left ankle. Able to bear weight following this. + swelling that has persisted. Most pain is lateral but having some medial pain. No prior ankle injuries. Taking ibuprofen.  3/25: Patient returns with great improvement since last visit. Only mild 1/10 pain around ankle. Swells at end of day especially when on feet. Wearing ASO regularly. Doing ROM exercises. Not taking anything for pain.  Past Medical History  Diagnosis Date  . Hyperlipidemia   . Hypertension   . Diabetes mellitus   . GERD (gastroesophageal reflux disease)   . Gout   . Hepatic steatosis     Current Outpatient Prescriptions on File Prior to Visit  Medication Sig Dispense Refill  . allopurinol (ZYLOPRIM) 300 MG tablet Take 150 mg by mouth daily.       Marland Kitchen amLODipine (NORVASC) 10 MG tablet Take 0.5 tablets (5 mg total) by mouth daily.  90 tablet  2  . aspirin 81 MG tablet Take 81 mg by mouth daily.        Marland Kitchen atorvastatin (LIPITOR) 20 MG tablet 1 by mouth daily, RECHECK labs in 10 weeks  90 tablet  0  . benazepril (LOTENSIN) 20 MG tablet TAKE 1 TABLET BY MOUTH DAILY  90 tablet  2  . Blood Glucose Monitoring Suppl (TRUERESULT BLOOD GLUCOSE) W/DEVICE KIT by Does not apply route.       . Cholecalciferol (VITAMIN D3) 1000 UNITS CAPS Take 1,000 Units by mouth daily.        . Lancets MISC TrueTest Lancets: Check blood sugar 1-2 x daily  200 each  11  . Misc Natural Products (FOCUSED MIND PO) Take 6 tablets by mouth daily.       . Multiple Vitamins-Iron (MULTIVITAMINS WITH IRON) TABS Take 1 tablet by mouth daily.  30 tablet  0  . NON FORMULARY Trueresult Test Strips: check blood sugar daily as directed       . Omega-3 Fatty Acids (FISH OIL) 1000 MG CAPS Take  3,000 mg by mouth daily.       . pantoprazole (PROTONIX) 40 MG tablet Take 1 tablet (40 mg total) by mouth daily.  90 tablet  1  . polyethylene glycol powder (GLYCOLAX/MIRALAX) powder USE AS DIRECTED  850 g  11  . pregabalin (LYRICA) 75 MG capsule Take 75 mg by mouth 2 (two) times daily.      . Vitamin D, Ergocalciferol, (DRISDOL) 50000 UNITS CAPS TAKE 1 CAPSULE BY MOUTH ONCE A WEEK.  12 capsule  11   No current facility-administered medications on file prior to visit.    Past Surgical History  Procedure Laterality Date  . 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 Cervical fusion      Dr Sherwood Gambler  . Lumbar fusion       Dr Sherwood Gambler  . Esophageal dilation  2007  . Colonoscopy  2007    negative; [facility]    No Known Allergies  History   Social History  . Marital Status: Married    Spouse Name: N/A    Number of Children: N/A  . Years of Education: N/A   Occupational History  .  Not on file.   Social History Main Topics  . Smoking status: Former Smoker    Quit date: 01/06/1976  . Smokeless tobacco: Never Used     Comment: smoked 35 years ago as of 2013   . Alcohol Use: No  . Drug Use: No  . Sexually Active: Not on file   Other Topics Concern  . Not on file   Social History Narrative  . No narrative on file    Family History  Problem Relation Age of Onset  . Heart disease Father     pacer  . Urolithiasis Father   . Heart attack Father   . Aneurysm Mother 28    CNS aneurysm  . Sudden death Mother   . Diabetes Sister   . Cancer Maternal Uncle     ? primary  . Cancer Paternal Uncle     ? primary  . Hyperlipidemia Neg Hx   . Hypertension Neg Hx     BP 111/70  Pulse 63  Ht 5\' 11"  (1.803 m)  Wt 210 lb (95.255 kg)  BMI 29.3 kg/m2  Review of Systems  See HPI above.    Objective:   Physical Exam  Gen: NAD  L ankle: Minimal swelling lateral > medial.  No other deformity, bruising. FROM with 5/5 strength all  directions. TTP greatest over ATFL, deltoid ligaments.  No malleoli tenderness.  No navicular, base 5th, other foot/ankle TTP. 1+ talar tilt.  1+ ant drawer. Negative syndesmotic compression. Thompsons test negative. NV intact distally.    Assessment & Plan:  1. Left ankle pain- radiographs negative for acute fracture.  2/2 grade 2 ankle sprain.  Doing well with ASO and HEP.  Start strengthening exercises and balance exercises for next 4 weeks.  Wear ASO for support as well for 4 weeks. Icing, nsaids, elevation as needed.  F/u in 4 weeks or as needed.

## 2012-05-12 ENCOUNTER — Ambulatory Visit (INDEPENDENT_AMBULATORY_CARE_PROVIDER_SITE_OTHER): Payer: Self-pay | Admitting: Family Medicine

## 2012-05-12 ENCOUNTER — Other Ambulatory Visit: Payer: Self-pay | Admitting: Internal Medicine

## 2012-05-12 DIAGNOSIS — E119 Type 2 diabetes mellitus without complications: Secondary | ICD-10-CM

## 2012-05-12 NOTE — Progress Notes (Signed)
Patient presents for 3 month follow up visit for DM. Medications have been reviewed. I have also discussed with patient lifestyle interventions such as diet and exercise. Full documentation of this visit can be found in the [employer] documenting system through [facility]). Patient has set a series of personal goals and will follow up in 3 mo for further review of DM.

## 2012-05-19 NOTE — Progress Notes (Signed)
Patient ID: [name], male   DOB: 01-May-1955, 57 y.o.   [name] ATTENDING PHYSICIAN NOTE: I have reviewed the chart and agree with the plan as detailed above. Dorcas Mcmurray MD Pager [phone]

## 2012-05-24 ENCOUNTER — Encounter: Payer: Self-pay | Admitting: Internal Medicine

## 2012-05-24 ENCOUNTER — Ambulatory Visit (INDEPENDENT_AMBULATORY_CARE_PROVIDER_SITE_OTHER): Payer: 59 | Admitting: Internal Medicine

## 2012-05-24 VITALS — BP 128/86 | HR 66 | Temp 98.5°F | Wt 217.0 lb

## 2012-05-24 DIAGNOSIS — G4733 Obstructive sleep apnea (adult) (pediatric): Secondary | ICD-10-CM

## 2012-05-24 DIAGNOSIS — D6489 Other specified anemias: Secondary | ICD-10-CM

## 2012-05-24 DIAGNOSIS — N289 Disorder of kidney and ureter, unspecified: Secondary | ICD-10-CM

## 2012-05-24 DIAGNOSIS — E559 Vitamin D deficiency, unspecified: Secondary | ICD-10-CM

## 2012-05-24 DIAGNOSIS — R5383 Other fatigue: Secondary | ICD-10-CM

## 2012-05-24 DIAGNOSIS — R5381 Other malaise: Secondary | ICD-10-CM

## 2012-05-24 NOTE — Progress Notes (Signed)
Subjective:    Patient ID: [name], male    DOB: Feb 16, 1955, 57 y.o.   [name]  HPI   He describes persistent fatigue for 2 weeks. He states he goes to bed tired and wakes up tired.  He had extensive labs done 02/12/12. Creatinine was 1.6; GFR 59.68; white count 4200; hematocrit 38.1. TSH was 0.8; other labs were normal.  He has been on vitamin D 50,000 international units weekly for over a year. There is no current vitamin D level on record.  His A1c is now 5.8%, non-diabetic on no medications.    Review of Systems  Fever/ chills : no  Night sweats: no                                                                                       Vision changes ( blurred/ double/ loss): no                                                                                                 Hoarseness or swallowing dysfunction: no                                                                                         Bowel changes( constipation/ diarrhea): no                                                                                     Weight change:no  Exertional chest pain: no Dyspnea on exertion: no  Cough:no  Hemoptysis: no New medications:no Leg swelling: no  PND: no Melena/ rectal bleeding: no  Adenopathy: no  Severe snoring: on CPAP  Skin / hair / nail changes: no Temperature intolerance( heat/ cold) : no  Feeling depressed: no  Altered appetite: no  Poor sleep/ Apnea :controlled with CPAP Abnormal bruising / bleeding or enlarged lymph nodes:no                                                                             Objective:   Physical Exam  Gen.: Thin but healthy and well-nourished in appearance. Alert, appropriate and cooperative throughout exam. Eyes: No corneal or conjunctival inflammation noted. No ptosis or lid lag Neck: No deformities, masses, or tenderness  noted.  Thyroid normal. Lungs: Normal respiratory effort; chest expands symmetrically. Lungs are clear to auscultation without rales, wheezes, or increased work of breathing. Heart: Normal rate and rhythm. Normal S1 and S2. No gallop, click, or rub. S4 w/o murmur. Abdomen: Bowel sounds normal; abdomen soft and nontender. No masses, organomegaly or hernias noted.                               Musculoskeletal/extremities: No clubbing, cyanosis, edema, or significant extremity  deformity noted. Range of motion normal .Tone & strength  Normal. Joints  reveal isolated flexion changes. Nail health good. Able to lie down & sit up w/o help.  Vascular: Carotid, radial artery, dorsalis pedis and  posterior tibial pulses are full and equal. No bruits present. Neurologic: Alert and oriented x3. Deep tendon reflexes symmetrical and normal.         Skin: Intact without suspicious lesions or rashes. Lymph: No cervical, axillary lymphadenopathy present. Psych: Mood and affect are normal. Normally interactive                                                                                        Assessment & Plan:  #1 fatigue; see orders. If all studies are negative, he should be reevaluated in reference to the CPAP. A timeout was prescribed he was 70 pounds heavier.  #2 vitamin D deficiency; updated level needed  #3 mild renal insufficiency  #4 anemia  #5 diabetes, resolved

## 2012-05-24 NOTE — Patient Instructions (Addendum)
Please review the record and make any corrections; share this with all medical staff seen. If you note change or progression in symptoms please contact us through My Chart ASAP. This will allow [place] to respond as quickly as possible and schedule appropriate studies (blood test or imaging).

## 2012-05-25 LAB — BASIC METABOLIC PANEL
BUN: 20 mg/dL (ref 6–23)
CO2: 30 mEq/L (ref 19–32)
Calcium: 9.5 mg/dL (ref 8.4–10.5)
Chloride: 107 mEq/L (ref 96–112)
Creatinine, Ser: 1.5 mg/dL (ref 0.4–1.5)
GFR: 61.92 mL/min (ref >60.00)
Glucose, Bld: 81 mg/dL (ref 70–99)
Potassium: 4.5 mEq/L (ref 3.5–5.1)
Sodium: 142 mEq/L (ref 135–145)

## 2012-05-25 LAB — CBC WITH DIFFERENTIAL/PLATELET
Basophils Absolute: 0 10*3/uL (ref 0.0–0.1)
Basophils Relative: 0.8 % (ref 0.0–3.0)
Eosinophils Absolute: 0.1 10*3/uL (ref 0.0–0.7)
Eosinophils Relative: 1.7 % (ref 0.0–5.0)
HCT: 38.5 % — ABNORMAL LOW (ref 39.0–52.0)
Hemoglobin: 12.8 g/dL — ABNORMAL LOW (ref 13.0–17.0)
Lymphocytes Relative: 38 % (ref 12.0–46.0)
Lymphs Abs: 1.4 10*3/uL (ref 0.7–4.0)
MCHC: 33.3 g/dL (ref 30.0–36.0)
MCV: 82.9 fl (ref 78.0–100.0)
Monocytes Absolute: 0.4 10*3/uL (ref 0.1–1.0)
Monocytes Relative: 11.3 % (ref 3.0–12.0)
Neutro Abs: 1.8 10*3/uL (ref 1.4–7.7)
Neutrophils Relative %: 48.2 % (ref 43.0–77.0)
Platelets: 183 10*3/uL (ref 150.0–400.0)
RBC: 4.64 Mil/uL (ref 4.22–5.81)
RDW: 13.7 % (ref 11.5–14.6)
WBC: 3.7 10*3/uL — ABNORMAL LOW (ref 4.5–10.5)

## 2012-05-25 LAB — TSH: TSH: 1.35 u[IU]/mL (ref 0.35–5.50)

## 2012-05-25 LAB — IBC PANEL
Iron: 53 ug/dL (ref 42–165)
Saturation Ratios: 16.6 % — ABNORMAL LOW (ref 20.0–50.0)
Transferrin: 228.3 mg/dL (ref 212.0–360.0)

## 2012-05-26 LAB — VITAMIN B12: Vitamin B-12: 870 pg/mL (ref 211–911)

## 2012-05-28 LAB — VITAMIN D 1,25 DIHYDROXY
Vitamin D 1, 25 (OH)2 Total: 97 pg/mL — ABNORMAL HIGH (ref 18–72)
Vitamin D2 1, 25 (OH)2: 53 pg/mL
Vitamin D3 1, 25 (OH)2: 44 pg/mL

## 2012-06-13 ENCOUNTER — Other Ambulatory Visit: Payer: Self-pay | Admitting: Internal Medicine

## 2012-06-13 DIAGNOSIS — E785 Hyperlipidemia, unspecified: Secondary | ICD-10-CM

## 2012-06-13 NOTE — Telephone Encounter (Signed)
Future order ([facility]) placed for fasting cholesterol panel.

## 2012-07-18 ENCOUNTER — Other Ambulatory Visit: Payer: Self-pay | Admitting: Internal Medicine

## 2012-08-05 ENCOUNTER — Emergency Department (HOSPITAL_BASED_OUTPATIENT_CLINIC_OR_DEPARTMENT_OTHER)
Admission: EM | Admit: 2012-08-05 | Discharge: 2012-08-05 | Disposition: A | Discharge status: Home / Self Care | Payer: 59 | Attending: Emergency Medicine | Admitting: Emergency Medicine

## 2012-08-05 ENCOUNTER — Encounter (HOSPITAL_BASED_OUTPATIENT_CLINIC_OR_DEPARTMENT_OTHER): Payer: Self-pay | Admitting: *Deleted

## 2012-08-05 DIAGNOSIS — Y9389 Activity, other specified: Secondary | ICD-10-CM | POA: Insufficient documentation

## 2012-08-05 DIAGNOSIS — Z7982 Long term (current) use of aspirin: Secondary | ICD-10-CM | POA: Insufficient documentation

## 2012-08-05 DIAGNOSIS — M109 Gout, unspecified: Secondary | ICD-10-CM | POA: Insufficient documentation

## 2012-08-05 DIAGNOSIS — Z79899 Other long term (current) drug therapy: Secondary | ICD-10-CM | POA: Insufficient documentation

## 2012-08-05 DIAGNOSIS — S76012A Strain of muscle, fascia and tendon of left hip, initial encounter: Secondary | ICD-10-CM

## 2012-08-05 DIAGNOSIS — S46909A Unspecified injury of unspecified muscle, fascia and tendon at shoulder and upper arm level, unspecified arm, initial encounter: Secondary | ICD-10-CM | POA: Insufficient documentation

## 2012-08-05 DIAGNOSIS — Z981 Arthrodesis status: Secondary | ICD-10-CM | POA: Insufficient documentation

## 2012-08-05 DIAGNOSIS — E119 Type 2 diabetes mellitus without complications: Secondary | ICD-10-CM | POA: Insufficient documentation

## 2012-08-05 DIAGNOSIS — Z8719 Personal history of other diseases of the digestive system: Secondary | ICD-10-CM | POA: Insufficient documentation

## 2012-08-05 DIAGNOSIS — S139XXA Sprain of joints and ligaments of unspecified parts of neck, initial encounter: Secondary | ICD-10-CM | POA: Insufficient documentation

## 2012-08-05 DIAGNOSIS — I10 Essential (primary) hypertension: Secondary | ICD-10-CM | POA: Insufficient documentation

## 2012-08-05 DIAGNOSIS — Y92410 Unspecified street and highway as the place of occurrence of the external cause: Secondary | ICD-10-CM | POA: Insufficient documentation

## 2012-08-05 DIAGNOSIS — S161XXA Strain of muscle, fascia and tendon at neck level, initial encounter: Secondary | ICD-10-CM

## 2012-08-05 DIAGNOSIS — IMO0002 Reserved for concepts with insufficient information to code with codable children: Secondary | ICD-10-CM | POA: Insufficient documentation

## 2012-08-05 DIAGNOSIS — Z87891 Personal history of nicotine dependence: Secondary | ICD-10-CM | POA: Insufficient documentation

## 2012-08-05 DIAGNOSIS — K219 Gastro-esophageal reflux disease without esophagitis: Secondary | ICD-10-CM | POA: Insufficient documentation

## 2012-08-05 DIAGNOSIS — S4980XA Other specified injuries of shoulder and upper arm, unspecified arm, initial encounter: Secondary | ICD-10-CM | POA: Insufficient documentation

## 2012-08-05 DIAGNOSIS — E785 Hyperlipidemia, unspecified: Secondary | ICD-10-CM | POA: Insufficient documentation

## 2012-08-05 MED ORDER — CYCLOBENZAPRINE HCL 10 MG PO TABS: 10.0000 mg | ORAL_TABLET | Freq: Three times a day (TID) | ORAL | Status: DC | PRN

## 2012-08-05 MED ORDER — IBUPROFEN 400 MG PO TABS
600.0000 mg | ORAL_TABLET | Freq: Once | ORAL | Status: AC
  Administered 2012-08-05: 600 mg via ORAL
  Filled 2012-08-05: qty 1

## 2012-08-05 MED ORDER — IBUPROFEN 600 MG PO TABS: 600.0000 mg | ORAL_TABLET | Freq: Three times a day (TID) | ORAL | Status: DC | PRN

## 2012-08-05 MED ORDER — HYDROCODONE-ACETAMINOPHEN 5-325 MG PO TABS: ORAL_TABLET | ORAL | Status: DC
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
## 2012-08-06 ENCOUNTER — Ambulatory Visit: Payer: 59 | Admitting: Family Medicine

## 2012-08-09 ENCOUNTER — Other Ambulatory Visit: Payer: Self-pay | Admitting: Internal Medicine

## 2012-08-22 ENCOUNTER — Other Ambulatory Visit: Payer: Self-pay | Admitting: Internal Medicine

## 2012-09-28 ENCOUNTER — Encounter: Payer: Self-pay | Admitting: Family Medicine

## 2012-09-28 ENCOUNTER — Ambulatory Visit (INDEPENDENT_AMBULATORY_CARE_PROVIDER_SITE_OTHER): Payer: 59 | Admitting: Family Medicine

## 2012-09-28 VITALS — BP 130/84 | HR 68 | Temp 98.8°F | Wt 220.6 lb

## 2012-09-28 DIAGNOSIS — L0102 Bockhart's impetigo: Secondary | ICD-10-CM | POA: Insufficient documentation

## 2012-09-28 DIAGNOSIS — L738 Other specified follicular disorders: Secondary | ICD-10-CM

## 2012-09-28 DIAGNOSIS — L678 Other hair color and hair shaft abnormalities: Secondary | ICD-10-CM

## 2012-09-28 MED ORDER — DOXYCYCLINE HYCLATE 100 MG PO TABS: 100.0000 mg | ORAL_TABLET | Freq: Two times a day (BID) | ORAL | Status: DC

## 2012-09-28 MED ORDER — TRIAMCINOLONE ACETONIDE 0.1 % EX OINT: TOPICAL_OINTMENT | Freq: Two times a day (BID) | CUTANEOUS | Status: DC

## 2012-09-28 NOTE — Progress Notes (Signed)
  Subjective:    Patient ID: [name], male    DOB: 11-30-1955, 57 y.o.   [name]  HPI Facial rash- shaved off beard 3 months ago, developed facial irritation.  Higinio Plan started growing in white and pt dyed beard w/ [employer].  Then developed facial swelling, painful to touch 1 week ago.  Denies pustules but bumps underlying the hair.  Itchy, + LAD.  Difficulty wearing CPAP mask.   Review of Systems For ROS see HPI     Objective:   Physical Exam  Vitals reviewed. Constitutional: He appears well-developed and well-nourished. No distress.  HENT:  Head: Normocephalic and atraumatic.  Skin: Skin is warm and dry.  Vesicles at hair follicles in beard distribution, very TTP w/ surrounding erythema and mild induration          Assessment & Plan:

## 2012-09-28 NOTE — Patient Instructions (Addendum)
Start the Doxycycline twice daily- take w/ food Make sure you are washing your face with an antibacterial soap This is a reaction to the dye- do not use it again Do NOT use your razor on your head- the infection could spread Get a new razor to start using 5 days into treatment Use the Triamcinolone ointment twice daily as needed for irritation and itching Call with any questions or concerns Hang in there!!!

## 2012-09-28 NOTE — Assessment & Plan Note (Signed)
New.  Likely a reaction to both shaving and hair dye.  Start abx and cover for possible MRSA.  Topical ointment prn.  Reviewed supportive care and red flags that should prompt return. Pt expressed understanding and is in agreement w/ plan.

## 2012-09-29 ENCOUNTER — Ambulatory Visit (INDEPENDENT_AMBULATORY_CARE_PROVIDER_SITE_OTHER): Payer: 59 | Admitting: Family Medicine

## 2012-09-29 VITALS — BP 123/77 | HR 59 | Wt 219.0 lb

## 2012-09-29 DIAGNOSIS — E119 Type 2 diabetes mellitus without complications: Secondary | ICD-10-CM
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
## 2012-10-06 ENCOUNTER — Other Ambulatory Visit: Payer: Self-pay | Admitting: Internal Medicine

## 2012-10-06 NOTE — Telephone Encounter (Signed)
Refill done.  

## 2012-10-19 ENCOUNTER — Ambulatory Visit (INDEPENDENT_AMBULATORY_CARE_PROVIDER_SITE_OTHER): Payer: 59

## 2012-10-19 DIAGNOSIS — Z23 Encounter for immunization: Secondary | ICD-10-CM

## 2012-10-24 ENCOUNTER — Other Ambulatory Visit: Payer: Self-pay | Admitting: Internal Medicine

## 2012-10-24 NOTE — Telephone Encounter (Signed)
Benazepril refill sent to pharmacy

## 2012-12-14 ENCOUNTER — Ambulatory Visit (HOSPITAL_BASED_OUTPATIENT_CLINIC_OR_DEPARTMENT_OTHER)
Admission: RE | Admit: 2012-12-14 | Discharge: 2012-12-14 | Disposition: A | Discharge status: Home / Self Care | Payer: 59 | Source: Ambulatory Visit | Attending: Family Medicine | Admitting: Family Medicine

## 2012-12-14 ENCOUNTER — Encounter: Payer: Self-pay | Admitting: Family Medicine

## 2012-12-14 ENCOUNTER — Ambulatory Visit (INDEPENDENT_AMBULATORY_CARE_PROVIDER_SITE_OTHER): Payer: 59 | Admitting: Family Medicine

## 2012-12-14 VITALS — BP 118/77 | HR 76 | Ht 71.0 in | Wt 220.0 lb

## 2012-12-14 DIAGNOSIS — M25521 Pain in right elbow: Secondary | ICD-10-CM

## 2012-12-14 DIAGNOSIS — M7989 Other specified soft tissue disorders: Secondary | ICD-10-CM | POA: Insufficient documentation

## 2012-12-14 DIAGNOSIS — M25529 Pain in unspecified elbow: Secondary | ICD-10-CM | POA: Insufficient documentation

## 2012-12-14 MED ORDER — PREDNISONE (PAK) 10 MG PO TABS: ORAL_TABLET | ORAL | Status: DC

## 2012-12-14 NOTE — Patient Instructions (Addendum)
Your x-rays are negative for fracture. This is likely an acute gout flare. Take prednisone dose pack as directed. Sling if needed. Consider icing 15 minutes at a time as pain starts to improve 3-4 times a day. After finished with prednisone take aleve 2 tabs twice a day with food for pain and inflammation. Consider norco as needed for severe pain. Follow up with me in 1 week if not improving.

## 2012-12-15 ENCOUNTER — Encounter: Payer: Self-pay | Admitting: Family Medicine

## 2012-12-15 DIAGNOSIS — M25521 Pain in right elbow: Secondary | ICD-10-CM | POA: Insufficient documentation

## 2012-12-15 NOTE — Assessment & Plan Note (Signed)
no fever, does not appear to have septic joint.  No breaks in skin either.  Nontoxic. Radiographs negative for fracture.  Consistent with acute gout flare especially with his prior history of this.  Sling if needed.  Prednisone dose pack, icing.  Aleve after finishing prednisone.  F/u in 1 week if not improving.  Debatable whether to stop allopurinol during an acute flare (do not initiate this medication during a flare) - will continue for now.

## 2012-12-15 NOTE — Progress Notes (Signed)
Patient ID: [name], male   DOB: November 18, 1955, 57 y.o.   [name]  PCP: Unice Cobble, MD  Subjective:   HPI: Patient is a 57 y.o. male here for right elbow pain.  Patient reports on thanksgiving he recalls striking lateral right elbow on a cabinet. Didn't have much pain after this. Then over past 1-2 weeks has had worsening pain, swelling, some redness posterior elbow. Painful with activities, any flexion and extension. No fevers, sweats, chills. Has history of gout.  Past Medical History  Diagnosis Date  . Hyperlipidemia   . Hypertension   . Diabetes mellitus   . GERD (gastroesophageal reflux disease)   . Gout   . Hepatic steatosis     Current Outpatient Prescriptions on File Prior to Visit  Medication Sig Dispense Refill  . allopurinol (ZYLOPRIM) 300 MG tablet Take 150 mg by mouth daily.       Marland Kitchen allopurinol (ZYLOPRIM) 300 MG tablet TAKE 1 TABLET BY MOUTH ONCE DAILY  30 tablet  1  . amLODipine (NORVASC) 10 MG tablet Take 5 mg by mouth 3 (three) times a week.      Marland Kitchen aspirin 81 MG tablet Take 81 mg by mouth daily.        Marland Kitchen atorvastatin (LIPITOR) 20 MG tablet TAKE 1 TABLET BY MOUTH ONCE DAILY  90 tablet  1  . benazepril (LOTENSIN) 20 MG tablet TAKE 1 TABLET BY MOUTH DAILY  90 tablet  2  . doxycycline (VIBRA-TABS) 100 MG tablet Take 1 tablet (100 mg total) by mouth 2 (two) times daily.  20 tablet  0  . glucose blood (TRUETEST TEST) test strip Check Blood Sugar once daily as directed, DX: 250.00  100 each  3  . Lancets MISC TrueTest Lancets: Check blood sugar 1-2 x daily  200 each  11  . Misc Natural Products (FOCUSED MIND PO) Take 6 tablets by mouth daily.       . Multiple Vitamins-Iron (MULTIVITAMINS WITH IRON) TABS Take 1 tablet by mouth daily.  30 tablet  0  . NON FORMULARY Trueresult Test Strips: check blood sugar daily as directed       . pantoprazole (PROTONIX) 40 MG tablet TAKE 1 TABLET BY MOUTH ONCE DAILY. REPLACES [place].  90 tablet  1  . polyethylene glycol  powder (GLYCOLAX/MIRALAX) powder USE AS DIRECTED  850 g  11  . pregabalin (LYRICA) 75 MG capsule Take 75 mg by mouth 2 (two) times daily.      Marland Kitchen triamcinolone ointment (KENALOG) 0.1 % Apply topically 2 (two) times daily.  90 g  1   No current facility-administered medications on file prior to visit.    Past Surgical History  Procedure Laterality Date  . 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 Cervical fusion      Dr Sherwood Gambler  . Lumbar fusion       Dr Sherwood Gambler  . Esophageal dilation  2007  . Colonoscopy  2007    negative; [facility]    No Known Allergies  History   Social History  . Marital Status: Married    Spouse Name: N/A    Number of Children: N/A  . Years of Education: N/A   Occupational History  . Not on file.   Social History Main Topics  . Smoking status: Former Smoker    Quit date: 01/06/1976  . Smokeless tobacco: Never Used     Comment: smoked 35 years ago as of  2013   . Alcohol Use: No  . Drug Use: No  . Sexual Activity: Not on file   Other Topics Concern  . Not on file   Social History Narrative  . No narrative on file    Family History  Problem Relation Age of Onset  . Heart disease Father     pacer  . Urolithiasis Father   . Heart attack Father   . Aneurysm Mother 36    CNS aneurysm  . Sudden death Mother   . Diabetes Sister   . Cancer Maternal Uncle     ? primary  . Cancer Paternal Uncle     ? primary  . Hyperlipidemia Neg Hx   . Hypertension Neg Hx     BP 118/77  Pulse 76  Ht 5\' 11"  (1.803 m)  Wt 220 lb (99.791 kg)  BMI 30.70 kg/m2  Review of Systems: See HPI above.    Objective:  Physical Exam:  Gen: NAD  R elbow: Mild swelling and warmth.  No erythema.  No swelling of olecranon bursa. TTP diffusely about elbow. Full flexion but painful.  Lacks 10 degrees extension. Collateral ligaments intact. NVI distally    Assessment & Plan:  1. Right elbow pain - no fever, does not appear  to have septic joint.  No breaks in skin either.  Nontoxic. Radiographs negative for fracture.  Consistent with acute gout flare especially with his prior history of this.  Sling if needed.  Prednisone dose pack, icing.  Aleve after finishing prednisone.  F/u in 1 week if not improving.  Debatable whether to stop allopurinol during an acute flare (do not initiate this medication during a flare) - will continue for now.

## 2012-12-23 ENCOUNTER — Encounter: Payer: Self-pay | Admitting: Family Medicine

## 2012-12-23 ENCOUNTER — Ambulatory Visit (INDEPENDENT_AMBULATORY_CARE_PROVIDER_SITE_OTHER): Payer: 59 | Admitting: Family Medicine

## 2012-12-23 VITALS — BP 103/68 | HR 69 | Temp 98.8°F | Ht 71.0 in | Wt 220.0 lb

## 2012-12-23 DIAGNOSIS — M25521 Pain in right elbow: Secondary | ICD-10-CM

## 2012-12-23 DIAGNOSIS — M25529 Pain in unspecified elbow: Secondary | ICD-10-CM

## 2012-12-23 NOTE — Patient Instructions (Signed)
You were given a cortisone shot for olecranon bursitis. Use a sling for comfort as much as possible. Icing, aleve or ibuprofen for pain and inflammation. ACE wrap for compression if tolerated. Things to consider if still not improving: physical therapy, nitro patches, repeat injection, surgical referral though typically you try conservative treatment for several months before considering surgery. Follow up with me in 3-4 weeks or as needed.

## 2012-12-25 ENCOUNTER — Encounter: Payer: Self-pay | Admitting: Family Medicine

## 2012-12-25 NOTE — Progress Notes (Signed)
Patient ID: [name], male   DOB: 12/18/1955, 57 y.o.   [name]  PCP: Unice Cobble, MD  Subjective:   HPI: Patient is a 57 y.o. male here for right elbow pain.  12/10: Patient reports on thanksgiving he recalls striking lateral right elbow on a cabinet. Didn't have much pain after this. Then over past 1-2 weeks has had worsening pain, swelling, some redness posterior elbow. Painful with activities, any flexion and extension. No fevers, sweats, chills. Has history of gout.  12/19: Patient reports he completed prednisone without much benefit. Still aching at night. Sore to the touch. No fevers, chills, sweats. Painful with all activities.  Past Medical History  Diagnosis Date  . Hyperlipidemia   . Hypertension   . Diabetes mellitus   . GERD (gastroesophageal reflux disease)   . Gout   . Hepatic steatosis     Current Outpatient Prescriptions on File Prior to Visit  Medication Sig Dispense Refill  . allopurinol (ZYLOPRIM) 300 MG tablet Take 150 mg by mouth daily.       Marland Kitchen allopurinol (ZYLOPRIM) 300 MG tablet TAKE 1 TABLET BY MOUTH ONCE DAILY  30 tablet  1  . amLODipine (NORVASC) 10 MG tablet Take 5 mg by mouth 3 (three) times a week.      Marland Kitchen aspirin 81 MG tablet Take 81 mg by mouth daily.        Marland Kitchen atorvastatin (LIPITOR) 20 MG tablet TAKE 1 TABLET BY MOUTH ONCE DAILY  90 tablet  1  . benazepril (LOTENSIN) 20 MG tablet TAKE 1 TABLET BY MOUTH DAILY  90 tablet  2  . doxycycline (VIBRA-TABS) 100 MG tablet Take 1 tablet (100 mg total) by mouth 2 (two) times daily.  20 tablet  0  . glucose blood (TRUETEST TEST) test strip Check Blood Sugar once daily as directed, DX: 250.00  100 each  3  . Lancets MISC TrueTest Lancets: Check blood sugar 1-2 x daily  200 each  11  . Misc Natural Products (FOCUSED MIND PO) Take 6 tablets by mouth daily.       . Multiple Vitamins-Iron (MULTIVITAMINS WITH IRON) TABS Take 1 tablet by mouth daily.  30 tablet  0  . NON FORMULARY Trueresult  Test Strips: check blood sugar daily as directed       . pantoprazole (PROTONIX) 40 MG tablet TAKE 1 TABLET BY MOUTH ONCE DAILY. REPLACES [place].  90 tablet  1  . polyethylene glycol powder (GLYCOLAX/MIRALAX) powder USE AS DIRECTED  850 g  11  . predniSONE (STERAPRED UNI-PAK) 10 MG tablet 6 tabs po day 1, 5 tabs po day 2, 4 tabs po day 3, 3 tabs po day 4, 2 tabs po day 5, 1 tab po day 6  21 tablet  0  . pregabalin (LYRICA) 75 MG capsule Take 75 mg by mouth 2 (two) times daily.      Marland Kitchen triamcinolone ointment (KENALOG) 0.1 % Apply topically 2 (two) times daily.  90 g  1   No current facility-administered medications on file prior to visit.    Past Surgical History  Procedure Laterality Date  . 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 Cervical fusion      Dr Sherwood Gambler  . Lumbar fusion       Dr Sherwood Gambler  . Esophageal dilation  2007  . Colonoscopy  2007    negative; [facility]    Allergies  Allergen Reactions  . Prednisone  History   Social History  . Marital Status: Married    Spouse Name: N/A    Number of Children: N/A  . Years of Education: N/A   Occupational History  . Not on file.   Social History Main Topics  . Smoking status: Former Smoker    Quit date: 01/06/1976  . Smokeless tobacco: Never Used     Comment: smoked 35 years ago as of 2013   . Alcohol Use: No  . Drug Use: No  . Sexual Activity: Not on file   Other Topics Concern  . Not on file   Social History Narrative  . No narrative on file    Family History  Problem Relation Age of Onset  . Heart disease Father     pacer  . Urolithiasis Father   . Heart attack Father   . Aneurysm Mother 69    CNS aneurysm  . Sudden death Mother   . Diabetes Sister   . Cancer Maternal Uncle     ? primary  . Cancer Paternal Uncle     ? primary  . Hyperlipidemia Neg Hx   . Hypertension Neg Hx     BP 103/68  Pulse 69  Temp(Src) 98.8 F (37.1 C) (Oral)  Ht 5\' 11"  (1.803 m)   Wt 220 lb (99.791 kg)  BMI 30.70 kg/m2  Review of Systems: See HPI above.    Objective:  Physical Exam:  Gen: NAD  R elbow: Mild swelling and warmth.  No erythema.  No swelling of olecranon bursa though appears to have soft tissue swelling. TTP diffusely about posterior olecranon.  No triceps, epicondyle or other TTP. FROM - improved but still painful flexion and extension. Collateral ligaments intact. NVI distally    Assessment & Plan:  1. Right elbow pain - not improving with prednisone dose pack as would be expected with acute gout flare.  Nontoxic.  Afebrile.  No evidence of septic joint.  Radiographs negative for fracture.  Will treat as a traumatic olecranon bursitis as most of his pain is here, tender, worse with motions.  Will trial olecranon bursa injection which was given today.  Follow with icing, nsaids, sling as needed.  After informed written consent patient was seated on exam table.  Area overlying right olecranon bursa prepped with alcohol swab then injected with 2:1 marcaine: depomedrol.  Patient tolerated procedure well without immediate complications.

## 2012-12-26 ENCOUNTER — Ambulatory Visit: Payer: 59 | Admitting: Family Medicine

## 2012-12-26 ENCOUNTER — Encounter: Payer: Self-pay | Admitting: Family Medicine

## 2012-12-26 NOTE — Assessment & Plan Note (Signed)
not improving with prednisone dose pack as would be expected with acute gout flare.  Nontoxic.  Afebrile.  No evidence of septic joint.  Radiographs negative for fracture.  Will treat as a traumatic olecranon bursitis as most of his pain is here, tender, worse with motions.  Will trial olecranon bursa injection which was given today.  Follow with icing, nsaids, sling as needed.  After informed written consent patient was seated on exam table.  Area overlying right olecranon bursa prepped with alcohol swab then injected with 2:1 marcaine: depomedrol.  Patient tolerated procedure well without immediate complications.

## 2012-12-27 NOTE — Progress Notes (Signed)
Patient ID: [name], male   DOB: 10-Aug-1955, 57 y.o.   [name] ATTENDING PHYSICIAN NOTE: I have reviewed the chart and agree with the plan as detailed above. Dorcas Mcmurray MD Pager [phone]

## 2013-01-02 ENCOUNTER — Encounter: Payer: Self-pay | Admitting: *Deleted

## 2013-01-09 ENCOUNTER — Other Ambulatory Visit: Payer: Self-pay | Admitting: Internal Medicine

## 2013-01-09 NOTE — Telephone Encounter (Signed)
Allopurinol refilled per protocol. JG//CMA 

## 2013-01-16 ENCOUNTER — Other Ambulatory Visit: Payer: Self-pay | Admitting: Internal Medicine

## 2013-01-16 NOTE — Telephone Encounter (Signed)
Atorvastatin refilled per protocol. JG//CMA 

## 2013-01-30 ENCOUNTER — Encounter: Payer: Self-pay | Admitting: Family Medicine

## 2013-01-30 ENCOUNTER — Ambulatory Visit (INDEPENDENT_AMBULATORY_CARE_PROVIDER_SITE_OTHER): Payer: 59 | Admitting: Family Medicine

## 2013-01-30 VITALS — BP 111/73 | HR 73 | Ht 71.0 in | Wt 220.0 lb

## 2013-01-30 DIAGNOSIS — M25521 Pain in right elbow: Secondary | ICD-10-CM

## 2013-01-30 DIAGNOSIS — M25529 Pain in unspecified elbow: Secondary | ICD-10-CM

## 2013-01-30 NOTE — Patient Instructions (Signed)
Your options for the little bursitis and the bone spur of your elbow are limited at this point. Usually physical therapy is the next step - call me if you want to do this. Continue with icing, compression (the sleeve is a good idea if it doesn't hurt too much in this), easy motion and stretching. Talking with a surgeon is usually only recommended if this doesn't resolve after 6 months but its not necessary to wait this long. An MRI is unlikely to show anything else in this area. Follow up with me as needed.

## 2013-01-31 IMAGING — CR DG CERVICAL SPINE 2 OR 3 VIEWS
1 series · 1 of 1 positions shown · non-contrast
Comparison: CT 01/24/2010, MRI 01/21/2010

CLINICAL DATA: ACDF C5-6 and C6-7

CERVICAL SPINE - 2-3 VIEW

[view not recorded]
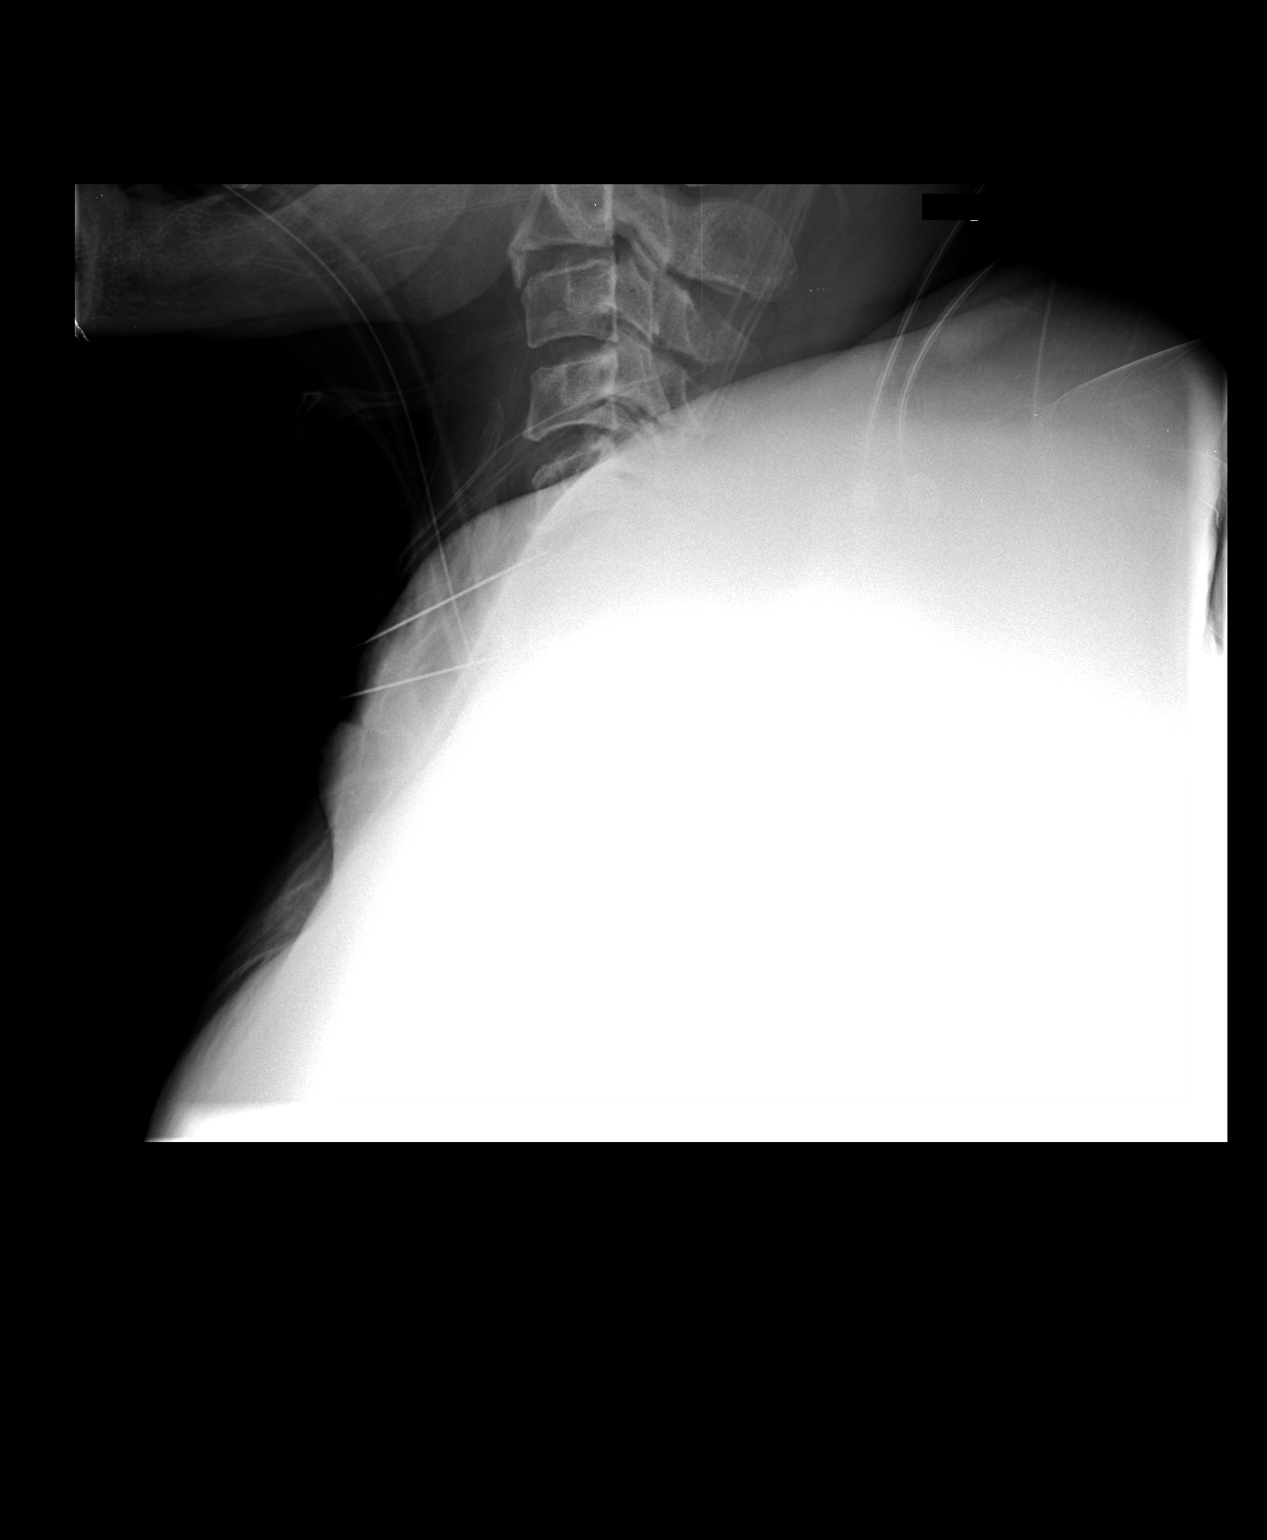

[1 of 1 positions shown; findings below may reference images not displayed]

FINDINGS: Image #1 reveals  needle localization of the C5-6 and C6-
7 interspaces.

Image #2 reveals anterior plate at C5-6 with bone plug in the
interspace.  C6-7 is not adequately visualized due to overlying
tissues.
IMPRESSION: ACDF of C5-6.  C6-7 not visualized and further radiographs are
suggested.

## 2013-02-03 ENCOUNTER — Encounter: Payer: Self-pay | Admitting: Family Medicine

## 2013-02-03 NOTE — Progress Notes (Signed)
Patient ID: [name], male   DOB: 04-22-1955, 58 y.o.   [name]  PCP: Unice Cobble, MD  Subjective:   HPI: Patient is a 58 y.o. male here for right elbow pain.  12/10: Patient reports on thanksgiving he recalls striking lateral right elbow on a cabinet. Didn't have much pain after this. Then over past 1-2 weeks has had worsening pain, swelling, some redness posterior elbow. Painful with activities, any flexion and extension. No fevers, sweats, chills. Has history of gout.  12/19: Patient reports he completed prednisone without much benefit. Still aching at night. Sore to the touch. No fevers, chills, sweats. Painful with all activities.  1/26: Patient reports still having pain that is 1/10 at rest, up to 8/10 at worst. Cannot straighten elbow completely due to pain. No redness, fevers, sweats. Taking tylenol arthritis and using a compression sleeve. Injection only helped a little bit.  Past Medical History  Diagnosis Date  . Hyperlipidemia   . Hypertension   . Diabetes mellitus   . GERD (gastroesophageal reflux disease)   . Gout   . Hepatic steatosis     Current Outpatient Prescriptions on File Prior to Visit  Medication Sig Dispense Refill  . allopurinol (ZYLOPRIM) 300 MG tablet Take 150 mg by mouth daily.       Marland Kitchen allopurinol (ZYLOPRIM) 300 MG tablet TAKE 1 TABLET BY MOUTH ONCE DAILY  30 tablet  PRN  . amLODipine (NORVASC) 10 MG tablet Take 5 mg by mouth 3 (three) times a week.      Marland Kitchen aspirin 81 MG tablet Take 81 mg by mouth daily.        Marland Kitchen atorvastatin (LIPITOR) 20 MG tablet TAKE 1 TABLET BY MOUTH ONCE DAILY  90 tablet  1  . benazepril (LOTENSIN) 20 MG tablet TAKE 1 TABLET BY MOUTH DAILY  90 tablet  2  . doxycycline (VIBRA-TABS) 100 MG tablet Take 1 tablet (100 mg total) by mouth 2 (two) times daily.  20 tablet  0  . glucose blood (TRUETEST TEST) test strip Check Blood Sugar once daily as directed, DX: 250.00  100 each  3  . Lancets MISC TrueTest  Lancets: Check blood sugar 1-2 x daily  200 each  11  . Misc Natural Products (FOCUSED MIND PO) Take 6 tablets by mouth daily.       . Multiple Vitamins-Iron (MULTIVITAMINS WITH IRON) TABS Take 1 tablet by mouth daily.  30 tablet  0  . NON FORMULARY Trueresult Test Strips: check blood sugar daily as directed       . pantoprazole (PROTONIX) 40 MG tablet TAKE 1 TABLET BY MOUTH ONCE DAILY. REPLACES [place].  90 tablet  1  . polyethylene glycol powder (GLYCOLAX/MIRALAX) powder USE AS DIRECTED  850 g  11  . predniSONE (STERAPRED UNI-PAK) 10 MG tablet 6 tabs po day 1, 5 tabs po day 2, 4 tabs po day 3, 3 tabs po day 4, 2 tabs po day 5, 1 tab po day 6  21 tablet  0  . pregabalin (LYRICA) 75 MG capsule Take 75 mg by mouth 2 (two) times daily.      Marland Kitchen triamcinolone ointment (KENALOG) 0.1 % Apply topically 2 (two) times daily.  90 g  1   No current facility-administered medications on file prior to visit.    Past Surgical History  Procedure Laterality Date  . 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 Cervical fusion  Dr Sherwood Gambler  . Lumbar fusion       Dr Sherwood Gambler  . Esophageal dilation  2007  . Colonoscopy  2007    negative; [facility]    Allergies  Allergen Reactions  . Prednisone     History   Social History  . Marital Status: Married    Spouse Name: N/A    Number of Children: N/A  . Years of Education: N/A   Occupational History  . Not on file.   Social History Main Topics  . Smoking status: Former Smoker    Quit date: 01/06/1976  . Smokeless tobacco: Never Used     Comment: smoked 35 years ago as of 2013   . Alcohol Use: No  . Drug Use: No  . Sexual Activity: Not on file   Other Topics Concern  . Not on file   Social History Narrative  . No narrative on file    Family History  Problem Relation Age of Onset  . Heart disease Father     pacer  . Urolithiasis Father   . Heart attack Father   . Aneurysm Mother 28    CNS aneurysm   . Sudden death Mother   . Diabetes Sister   . Cancer Maternal Uncle     ? primary  . Cancer Paternal Uncle     ? primary  . Hyperlipidemia Neg Hx   . Hypertension Neg Hx     BP 111/73  Pulse 73  Ht 5\' 11"  (1.803 m)  Wt 220 lb (99.791 kg)  BMI 30.70 kg/m2  Review of Systems: See HPI above.    Objective:  Physical Exam:  Gen: NAD  R elbow: Mild swelling but no warmth.  No erythema.  No swelling of olecranon bursa. TTP diffusely about posterior olecranon.  No triceps, epicondyle or other TTP. FROM - improved but still painful flexion and especially extension. Collateral ligaments intact. NVI distally    Assessment & Plan:  1. Right elbow pain - not improving with prednisone dose pack, injection into olecranon bursa.  Suspect much of pain then due to bone spur in this area.  No evidence septic joint.  Icing, nsaids, sling as needed. Consider physical therapy, ortho referral.  Doubt MRI would show anything to change management.  F/u prn.

## 2013-02-03 NOTE — Assessment & Plan Note (Signed)
not improving with prednisone dose pack, injection into olecranon bursa.  Suspect much of pain then due to bone spur in this area.  No evidence septic joint.  Icing, nsaids, sling as needed. Consider physical therapy, ortho referral.  Doubt MRI would show anything to change management.  F/u prn.

## 2013-02-13 ENCOUNTER — Other Ambulatory Visit: Payer: Self-pay | Admitting: Internal Medicine

## 2013-02-14 NOTE — Telephone Encounter (Signed)
Rx sent to the pharmacy by e-script.//AB/CMA 

## 2013-02-17 ENCOUNTER — Encounter: Payer: Self-pay | Admitting: Family Medicine

## 2013-02-17 ENCOUNTER — Encounter (HOSPITAL_COMMUNITY): Payer: Self-pay

## 2013-02-17 ENCOUNTER — Ambulatory Visit (INDEPENDENT_AMBULATORY_CARE_PROVIDER_SITE_OTHER): Payer: 59 | Admitting: Family Medicine

## 2013-02-17 ENCOUNTER — Ambulatory Visit (HOSPITAL_COMMUNITY)
Admission: RE | Admit: 2013-02-17 | Discharge: 2013-02-17 | Disposition: A | Discharge status: Home / Self Care | Payer: 59 | Source: Ambulatory Visit | Attending: Family Medicine | Admitting: Family Medicine

## 2013-02-17 VITALS — BP 120/72 | HR 83 | Temp 98.2°F | Resp 16 | Wt 227.4 lb

## 2013-02-17 DIAGNOSIS — I2510 Atherosclerotic heart disease of native coronary artery without angina pectoris: Secondary | ICD-10-CM | POA: Insufficient documentation

## 2013-02-17 DIAGNOSIS — N4289 Other specified disorders of prostate: Secondary | ICD-10-CM | POA: Insufficient documentation

## 2013-02-17 DIAGNOSIS — K5730 Diverticulosis of large intestine without perforation or abscess without bleeding: Secondary | ICD-10-CM | POA: Insufficient documentation

## 2013-02-17 DIAGNOSIS — I700 Atherosclerosis of aorta: Secondary | ICD-10-CM | POA: Insufficient documentation

## 2013-02-17 DIAGNOSIS — R319 Hematuria, unspecified: Secondary | ICD-10-CM | POA: Insufficient documentation

## 2013-02-17 DIAGNOSIS — M549 Dorsalgia, unspecified: Secondary | ICD-10-CM | POA: Insufficient documentation

## 2013-02-17 DIAGNOSIS — N433 Hydrocele, unspecified: Secondary | ICD-10-CM | POA: Insufficient documentation

## 2013-02-17 DIAGNOSIS — K5900 Constipation, unspecified: Secondary | ICD-10-CM | POA: Insufficient documentation

## 2013-02-17 MED ORDER — TRAMADOL HCL 50 MG PO TABS: 50.0000 mg | ORAL_TABLET | Freq: Three times a day (TID) | ORAL | Status: DC | PRN

## 2013-02-17 MED ORDER — CIPROFLOXACIN HCL 500 MG PO TABS: 500.0000 mg | ORAL_TABLET | Freq: Two times a day (BID) | ORAL | Status: DC

## 2013-02-17 NOTE — Assessment & Plan Note (Addendum)
New.  Pt's pain and hematuria mid-stream consistent w/ stone.  UA WNL in office- not suggestive of infxn.  Will get CT to assess.  Tramadol for pain.  Urology for f/u.  Pt expressed understanding and is in agreement w/ plan.   Based on CT results which show both bladder and kidney inflammation will start Cipro.  Pt aware via phone

## 2013-02-17 NOTE — Progress Notes (Signed)
   Subjective:    Patient ID: [name], male    DOB: 1955/05/09, 58 y.o.   [name]  HPI Hematuria- yesterday had suprapubic pressure and pain w/ urination.  During urination stream turned pink.  Continued to have suprapubic pressure, pain w/ urination.  Had 2nd episode of blood in urine this AM.  + frequency, urgency.  Some low back pain.  No hx of kidney stones.  No pain in testicles.  Suprapubic and penile pain improves after urination.   Review of Systems For ROS see HPI     Objective:   Physical Exam  Vitals reviewed. Constitutional: He appears well-developed and well-nourished. No distress.  Abdominal: Soft. Bowel sounds are normal. He exhibits no distension. There is no tenderness (not currently). There is no rebound and no guarding.  Genitourinary: No penile tenderness (pt reports pain improved after voiding in office).  Musculoskeletal:  + lumbar back pain          Assessment & Plan:

## 2013-02-17 NOTE — Patient Instructions (Signed)
Drink plenty of fluids Start tramadol as needed for pain We'll notify you of your CT results and call you with your urology appt Call with any questions or concerns Hang in there!

## 2013-02-17 NOTE — Addendum Note (Signed)
Addended by: Midge Minium on: 02/17/2013 03:33 PM   Modules accepted: Orders

## 2013-02-17 NOTE — Progress Notes (Signed)
Pre visit review using our clinic review tool, if applicable. No additional management support is needed unless otherwise documented below in the visit note. 

## 2013-02-17 NOTE — Addendum Note (Signed)
Addended by: Modena Morrow D on: 02/17/2013 03:48 PM   Modules accepted: Orders

## 2013-02-19 LAB — URINE CULTURE
Colony Count: NO GROWTH
Organism ID, Bacteria: NO GROWTH

## 2013-02-23 ENCOUNTER — Ambulatory Visit (INDEPENDENT_AMBULATORY_CARE_PROVIDER_SITE_OTHER): Payer: 59 | Admitting: Family Medicine

## 2013-02-23 VITALS — BP 129/74 | HR 68 | Wt 229.0 lb

## 2013-02-23 DIAGNOSIS — E119 Type 2 diabetes mellitus without complications: Secondary | ICD-10-CM

## 2013-02-23 NOTE — Progress Notes (Signed)
Patient presents for 3 mo f/u DM as part of the employee sponsored Link to [employer]. Medications have been reviewed. I have also discussed with patient lifestyle interventions such as diet and exercise. Full documentation of this visit can be found in the [employer] documenting system through [facility]). However, specifics of this visit include the following:   Diabetes Mellitus: POC A1C 6.3, at goal. Off DM meds. He hasn't been able to exercise much. He has gained 10 lbs since last visit. A1C has gone up by 0.2.  plan 1.) patient will try to get back into exercising.  2.) will move f/u to 6 mo to see where we are with wt and see if his A1C is going to creep up anymore. It was 5.8 in may 2014 and then 6.1 in September 2014 and now 6.3.  Patient has set a series of personal goals and will f/u in 3 mo for further review of DM

## 2013-03-23 ENCOUNTER — Encounter: Payer: Self-pay | Admitting: Nurse Practitioner

## 2013-03-23 ENCOUNTER — Ambulatory Visit (INDEPENDENT_AMBULATORY_CARE_PROVIDER_SITE_OTHER): Payer: 59 | Admitting: Nurse Practitioner

## 2013-03-23 VITALS — BP 104/67 | HR 70 | Temp 97.7°F | Ht 71.0 in | Wt 226.0 lb

## 2013-03-23 DIAGNOSIS — R5381 Other malaise: Secondary | ICD-10-CM

## 2013-03-23 DIAGNOSIS — R5383 Other fatigue: Secondary | ICD-10-CM

## 2013-03-23 LAB — CBC
HCT: 39.4 % (ref 39.0–52.0)
Hemoglobin: 12.6 g/dL — ABNORMAL LOW (ref 13.0–17.0)
MCHC: 31.9 g/dL (ref 30.0–36.0)
MCV: 84.6 fl (ref 78.0–100.0)
Platelets: 193 10*3/uL (ref 150.0–400.0)
RBC: 4.66 Mil/uL (ref 4.22–5.81)
RDW: 14.1 % (ref 11.5–14.6)
WBC: 4.6 10*3/uL (ref 4.5–10.5)

## 2013-03-23 LAB — COMPREHENSIVE METABOLIC PANEL
ALT: 19 U/L (ref 0–53)
AST: 23 U/L (ref 0–37)
Albumin: 4.6 g/dL (ref 3.5–5.2)
Alkaline Phosphatase: 64 U/L (ref 39–117)
BUN: 22 mg/dL (ref 6–23)
CO2: 27 mEq/L (ref 19–32)
Calcium: 9.6 mg/dL (ref 8.4–10.5)
Chloride: 104 mEq/L (ref 96–112)
Creatinine, Ser: 1.6 mg/dL — ABNORMAL HIGH (ref 0.4–1.5)
GFR: 59.45 mL/min — ABNORMAL LOW (ref >60.00)
Glucose, Bld: 78 mg/dL (ref 70–99)
Potassium: 3.8 mEq/L (ref 3.5–5.1)
Sodium: 139 mEq/L (ref 135–145)
Total Bilirubin: 0.6 mg/dL (ref 0.3–1.2)
Total Protein: 7.7 g/dL (ref 6.0–8.3)

## 2013-03-23 LAB — TSH: TSH: 0.49 u[IU]/mL (ref 0.35–5.50)

## 2013-03-23 LAB — FERRITIN: Ferritin: 251.3 ng/mL (ref 22.0–322.0)

## 2013-03-23 NOTE — Progress Notes (Signed)
Pre visit review using our clinic review tool, if applicable. No additional management support is needed unless otherwise documented below in the visit note. 

## 2013-03-23 NOTE — Progress Notes (Signed)
Subjective:     [name] is a 58 y.o. male who presents for evaluation of fatigue. Symptoms began 2 weeks ago. Symptoms of his fatigue have been general malaise, headaches and unable to exercise due to lack of energy unrelieved by sleep.. Patient describes the following psychological symptoms: none. Patient denies cold intolerance, constipation, fever, significant change in weight, symptoms of arthritis, unusual rashes and lost 3 lbs. in 1 mo.Marland Kitchen No chest pain or DOE. Symptoms have gradually worsened. Symptom severity: struggles to carry out day to day responsibilities.. Previous visits for this problem: none. Last labs show wBC trending down. Iron sat low, mild anemia.  The following portions of the patient's history were reviewed and updated as appropriate: allergies, current medications, past family history, past medical history, past surgical history and problem list.  Review of Systems Pertinent items are noted in HPI.    Objective:    BP 104/67  Pulse 70  Temp(Src) 97.7 F (36.5 C) (Oral)  Ht 5\' 11"  (1.803 m)  Wt 226 lb (102.513 kg)  BMI 31.53 kg/m2  SpO2 99% General appearance: alert, cooperative, appears stated age, mild distress and looks tired Head: Normocephalic, without obvious abnormality, atraumatic Eyes: negative findings: lids and lashes normal, pupils equal, round, reactive to light and accomodation and mild conjunctival injection Ears: normal TM's and external ear canals both ears Throat: lips, mucosa, and tongue normal; teeth and gums normal Neck: no adenopathy, supple, symmetrical, trachea midline and thyroid not enlarged, symmetric, no tenderness/mass/nodules Lungs: clear to auscultation bilaterally Heart: regular rate and rhythm, S1, S2 normal, no murmur, click, rub or gallop Abdomen: soft, non-tender; bowel sounds normal; no masses,  no organomegaly Extremities: extremities normal, atraumatic, no cyanosis or edema Pulses: 2+ and symmetric   ECG: NSR   Assessment:    Fatigue Hx mild anemia & neutropenia DD: iron deficiency anemia, thyroid disease, viral illness, CVD Plan:  Labs today: CBC, CMET, TSH, iron studies F/u 1 week

## 2013-03-23 NOTE — Patient Instructions (Addendum)
Your lab test will help determine why you are feeling tired. Please follow up with Dr Larose Kells next week. My office will call you if labs need to be discussed before your appointment. Nice to meet you.   Fatigue Fatigue is a feeling of tiredness, lack of energy, lack of motivation, or feeling tired all the time. Having enough rest, good nutrition, and reducing stress will normally reduce fatigue. Consult your caregiver if it persists. The nature of your fatigue will help your caregiver to find out its cause. The treatment is based on the cause.  CAUSES  There are many causes for fatigue. Most of the time, fatigue can be traced to one or more of your habits or routines. Most causes fit into one or more of three general areas. They are: Lifestyle problems  Sleep disturbances.  Overwork.  Physical exertion.  Unhealthy habits.  Poor eating habits or eating disorders.  Alcohol and/or drug use .  Lack of proper nutrition (malnutrition). Psychological problems  Stress and/or anxiety problems.  Depression.  Grief.  Boredom. Medical Problems or Conditions  Anemia.  Pregnancy.  Thyroid gland problems.  Recovery from major surgery.  Continuous pain.  Emphysema or asthma that is not well controlled  Allergic conditions.  Diabetes.  Infections (such as mononucleosis).  Obesity.  Sleep disorders, such as sleep apnea.  Heart failure or other heart-related problems.  Cancer.  Kidney disease.  Liver disease.  Effects of certain medicines such as antihistamines, cough and cold remedies, prescription pain medicines, heart and blood pressure medicines, drugs used for treatment of cancer, and some antidepressants. SYMPTOMS  The symptoms of fatigue include:   Lack of energy.  Lack of drive (motivation).  Drowsiness.  Feeling of indifference to the surroundings. DIAGNOSIS  The details of how you feel help guide your caregiver in finding out what is causing the fatigue.  You will be asked about your present and past health condition. It is important to review all medicines that you take, including prescription and non-prescription items. A thorough exam will be done. You will be questioned about your feelings, habits, and normal lifestyle. Your caregiver may suggest blood tests, urine tests, or other tests to look for common medical causes of fatigue.  TREATMENT  Fatigue is treated by correcting the underlying cause. For example, if you have continuous pain or depression, treating these causes will improve how you feel. Similarly, adjusting the dose of certain medicines will help in reducing fatigue.  HOME CARE INSTRUCTIONS   Try to get the required amount of good sleep every night.  Eat a healthy and nutritious diet, and drink enough water throughout the day.  Practice ways of relaxing (including yoga or meditation).  Exercise regularly.  Make plans to change situations that cause stress. Act on those plans so that stresses decrease over time. Keep your work and personal routine reasonable.  Avoid street drugs and minimize use of alcohol.  Start taking a daily multivitamin after consulting your caregiver. SEEK MEDICAL CARE IF:   You have persistent tiredness, which cannot be accounted for.  You have fever.  You have unintentional weight loss.  You have headaches.  You have disturbed sleep throughout the night.  You are feeling sad.  You have constipation.  You have dry skin.  You have gained weight.  You are taking any new or different medicines that you suspect are causing fatigue.  You are unable to sleep at night.  You develop any unusual swelling of your legs or other  parts of your body. SEEK IMMEDIATE MEDICAL CARE IF:   You are feeling confused.  Your vision is blurred.  You feel faint or pass out.  You develop severe headache.  You develop severe abdominal, pelvic, or back pain.  You develop chest pain, shortness of  breath, or an irregular or fast heartbeat.  You are unable to pass a normal amount of urine.  You develop abnormal bleeding such as bleeding from the rectum or you vomit blood.  You have thoughts about harming yourself or committing suicide.  You are worried that you might harm someone else. MAKE SURE YOU:   Understand these instructions.  Will watch your condition.  Will get help right away if you are not doing well or get worse. Document Released: 10/19/2006 Document Revised: 03/16/2011 Document Reviewed: 10/19/2006 [facility] Patient Information 2014 [place].

## 2013-03-24 ENCOUNTER — Telehealth: Payer: Self-pay | Admitting: Nurse Practitioner

## 2013-03-24 ENCOUNTER — Ambulatory Visit: Payer: 59

## 2013-03-24 DIAGNOSIS — D649 Anemia, unspecified: Secondary | ICD-10-CM

## 2013-03-24 DIAGNOSIS — R946 Abnormal results of thyroid function studies: Secondary | ICD-10-CM

## 2013-03-24 LAB — IRON AND TIBC
%SAT: 24 % (ref 20–55)
Iron: 74 ug/dL (ref 42–165)
TIBC: 311 ug/dL (ref 215–435)
UIBC: 237 ug/dL (ref 125–400)

## 2013-03-24 LAB — T4, FREE: Free T4: 0.75 ng/dL (ref 0.60–1.60)

## 2013-03-24 NOTE — Telephone Encounter (Signed)
Fatigue work-up: Continues to have chronic anemia. Does not appear to be r/t iron deficiency. Recent Hx hematuria: CT abd & pelvis showed inflmtn of bladder wall & density on L kidney thought to be stone or cyst. Renal ref made & Tx started w/cipro.   TSH low nml, Free T4 low nml. DD: central hypothyroidism, adrenal fatigue  Neutropenia has corrected.  BUN elevated but stable compared to prev labs. GFR decreased but stable.  DD: GI blood loss, malignancy, thyroid dz No record of colonoscopy or guaiac tests. Will order hemoccult cards. ua w/micro to f/u on hematuria  Whole body bone scan 2012 was nml. Has f/u w/Dr Birdie Riddle next week.

## 2013-03-27 NOTE — Telephone Encounter (Signed)
Patient notified of results.

## 2013-03-29 ENCOUNTER — Encounter: Payer: Self-pay | Admitting: Family Medicine

## 2013-03-29 ENCOUNTER — Ambulatory Visit (INDEPENDENT_AMBULATORY_CARE_PROVIDER_SITE_OTHER): Payer: 59 | Admitting: Family Medicine

## 2013-03-29 VITALS — BP 102/68 | HR 84 | Temp 97.7°F | Resp 16 | Wt 230.5 lb

## 2013-03-29 DIAGNOSIS — R6882 Decreased libido: Secondary | ICD-10-CM | POA: Insufficient documentation

## 2013-03-29 DIAGNOSIS — F3289 Other specified depressive episodes: Secondary | ICD-10-CM

## 2013-03-29 DIAGNOSIS — R5381 Other malaise: Secondary | ICD-10-CM | POA: Insufficient documentation

## 2013-03-29 DIAGNOSIS — F329 Major depressive disorder, single episode, unspecified: Secondary | ICD-10-CM

## 2013-03-29 DIAGNOSIS — F39 Unspecified mood [affective] disorder: Secondary | ICD-10-CM | POA: Insufficient documentation

## 2013-03-29 DIAGNOSIS — F32A Depression, unspecified: Secondary | ICD-10-CM

## 2013-03-29 DIAGNOSIS — R5383 Other fatigue: Secondary | ICD-10-CM

## 2013-03-29 MED ORDER — FLUOXETINE HCL 20 MG PO TABS: 20.0000 mg | ORAL_TABLET | Freq: Every day | ORAL | Status: DC

## 2013-03-29 NOTE — Assessment & Plan Note (Signed)
New.  Mild-moderate according to [place] and pt's reported sxs.  Start low dose Prozac and monitor for symptom improvement.  Will follow closely.

## 2013-03-29 NOTE — Patient Instructions (Signed)
Follow up in 1 month to recheck mood Start the Prozac daily for both mood and energy level We'll notify you of your lab results and make any changes if needed Please call with any questions or concerns Hang in there!

## 2013-03-29 NOTE — Progress Notes (Signed)
   Subjective:    Patient ID: [name], male    DOB: 1955/12/27, 58 y.o.   [name]  HPI Fatigue- pt reports excessive fatigue, decreased motivation, low strength, weight gain despite not changing diet.  Waking up feeling poorly rested.  No trouble falling asleep, some trouble staying asleep.  A1C last Monday was 6.2.  Pt admits to depression.  No hx of similar.  Mildly decreased sex drive.  Intermittent ED.   Review of Systems For ROS see HPI     Objective:   Physical Exam  Vitals reviewed. Constitutional: He is oriented to person, place, and time. He appears well-developed and well-nourished. No distress.  HENT:  Head: Normocephalic and atraumatic.  Eyes: Conjunctivae and EOM are normal. Pupils are equal, round, and reactive to light.  Neck: Normal range of motion. Neck supple. No thyromegaly present.  Cardiovascular: Normal rate, regular rhythm, normal heart sounds and intact distal pulses.   No murmur heard. Pulmonary/Chest: Effort normal and breath sounds normal. No respiratory distress.  Abdominal: Soft. Bowel sounds are normal. He exhibits no distension.  Musculoskeletal: He exhibits no edema.  Lymphadenopathy:    He has no cervical adenopathy.  Neurological: He is alert and oriented to person, place, and time. No cranial nerve deficit.  Skin: Skin is warm and dry.  Psychiatric: He has a normal mood and affect. His behavior is normal.          Assessment & Plan:

## 2013-03-29 NOTE — Assessment & Plan Note (Signed)
New.  May be side effect of depression but may also be due to decreased testosterone.  Check level.  Replete testosterone prn.

## 2013-03-29 NOTE — Assessment & Plan Note (Signed)
New to provider.  Reviewed recent labs- all normal.  No thyroid abnormalities, Hgb stable.  Suspect this is due to pt's depression.  Check testosterone level.  Will follow.

## 2013-03-30 LAB — TESTOSTERONE, FREE, TOTAL, SHBG
Sex Hormone Binding: 43 nmol/L (ref 13–71)
Testosterone, Free: 51.7 pg/mL (ref 47.0–244.0)
Testosterone-% Free: 1.7 % (ref 1.6–2.9)
Testosterone: 310 ng/dL (ref 300–890)

## 2013-03-31 ENCOUNTER — Ambulatory Visit: Payer: 59 | Admitting: Family Medicine

## 2013-04-27 NOTE — Progress Notes (Signed)
Patient ID: [name], male   DOB: 1955-03-01, 58 y.o.   MRN: [id number] ATTENDING PHYSICIAN NOTE: I have reviewed the chart and agree with the plan as detailed above. Dorcas Mcmurray MD Pager [phone]

## 2013-05-01 ENCOUNTER — Encounter: Payer: Self-pay | Admitting: Family Medicine

## 2013-05-01 ENCOUNTER — Ambulatory Visit (INDEPENDENT_AMBULATORY_CARE_PROVIDER_SITE_OTHER): Payer: 59 | Admitting: Family Medicine

## 2013-05-01 VITALS — BP 116/72 | HR 61 | Temp 98.0°F | Resp 16 | Wt 230.2 lb

## 2013-05-01 DIAGNOSIS — F32A Depression, unspecified: Secondary | ICD-10-CM

## 2013-05-01 DIAGNOSIS — F3289 Other specified depressive episodes: Secondary | ICD-10-CM

## 2013-05-01 DIAGNOSIS — Z9989 Dependence on other enabling machines and devices: Principal | ICD-10-CM

## 2013-05-01 DIAGNOSIS — G4733 Obstructive sleep apnea (adult) (pediatric): Secondary | ICD-10-CM

## 2013-05-01 DIAGNOSIS — F329 Major depressive disorder, single episode, unspecified: Secondary | ICD-10-CM

## 2013-05-01 MED ORDER — FLUOXETINE HCL 40 MG PO CAPS: 40.0000 mg | ORAL_CAPSULE | Freq: Every day | ORAL | Status: DC

## 2013-05-01 NOTE — Patient Instructions (Signed)
Follow up in 6-8 weeks to recheck mood Increase the Fluoxetine to 40mg  daily (2 of what you have at home and 1 of the new prescription) We'll fax over the CPAP order and get that all squared away Call with any questions or concerns Hang in there!

## 2013-05-01 NOTE — Progress Notes (Signed)
Pre visit review using our clinic review tool, if applicable. No additional management support is needed unless otherwise documented below in the visit note. 

## 2013-05-01 NOTE — Progress Notes (Signed)
   Subjective:    Patient ID: [name], male    DOB: February 18, 1955, 58 y.o.   MRN: [id number]  HPI Depression- pt reports mood has improved since starting Fluoxetine.  Still w/ a little bit of lethargy, 'lacking that kick'.    OSA- chronic problem, pt was using CPAP nightly until his machine 'burned out'.  Pt reports he wakes feeling rested when he uses the machine.  Pt uses [place] for supplier.   Review of Systems For ROS see HPI     Objective:   Physical Exam  Vitals reviewed. Constitutional: He is oriented to person, place, and time. He appears well-developed and well-nourished. No distress.  HENT:  Head: Normocephalic and atraumatic.  Eyes: Conjunctivae and EOM are normal. Pupils are equal, round, and reactive to light.  Neck: Normal range of motion. Neck supple. No thyromegaly present.  Cardiovascular: Normal rate, regular rhythm, normal heart sounds and intact distal pulses.   No murmur heard. Pulmonary/Chest: Effort normal and breath sounds normal. No respiratory distress.  Abdominal: Soft. Bowel sounds are normal. He exhibits no distension.  Musculoskeletal: He exhibits no edema.  Lymphadenopathy:    He has no cervical adenopathy.  Neurological: He is alert and oriented to person, place, and time. No cranial nerve deficit.  Skin: Skin is warm and dry.  Psychiatric: He has a normal mood and affect. His behavior is normal.          Assessment & Plan:

## 2013-05-01 NOTE — Assessment & Plan Note (Signed)
Chronic problem.  Pt's machine has 'burnt out'.  Will send order for new machine and equipment.  Pt appreciative.

## 2013-05-01 NOTE — Assessment & Plan Note (Signed)
Improved since starting Prozac.  Pt feels sxs could improve even more w/ increased meds.  Will increase to 40mg  and monitor for improvement.

## 2013-05-08 LAB — HM DIABETES EYE EXAM

## 2013-05-09 ENCOUNTER — Encounter: Payer: Self-pay | Admitting: General Practice

## 2013-05-26 DIAGNOSIS — M47816 Spondylosis without myelopathy or radiculopathy, lumbar region: Secondary | ICD-10-CM | POA: Insufficient documentation

## 2013-06-19 ENCOUNTER — Telehealth: Payer: Self-pay | Admitting: Family Medicine

## 2013-06-28 NOTE — Telephone Encounter (Signed)
Called patient to come in for lab work. Left message with wife. Please schedule for cholesterol and A1C test as part of her diabetes care.

## 2013-06-29 ENCOUNTER — Ambulatory Visit (INDEPENDENT_AMBULATORY_CARE_PROVIDER_SITE_OTHER): Payer: 59 | Admitting: Family Medicine

## 2013-06-29 ENCOUNTER — Ambulatory Visit (HOSPITAL_BASED_OUTPATIENT_CLINIC_OR_DEPARTMENT_OTHER)
Admission: RE | Admit: 2013-06-29 | Discharge: 2013-06-29 | Disposition: A | Discharge status: Home / Self Care | Payer: 59 | Source: Ambulatory Visit | Attending: Family Medicine | Admitting: Family Medicine

## 2013-06-29 ENCOUNTER — Encounter: Payer: Self-pay | Admitting: Family Medicine

## 2013-06-29 VITALS — BP 123/83 | HR 69 | Ht 72.0 in | Wt 227.0 lb

## 2013-06-29 DIAGNOSIS — M5430 Sciatica, unspecified side: Secondary | ICD-10-CM

## 2013-06-29 DIAGNOSIS — M25552 Pain in left hip: Secondary | ICD-10-CM

## 2013-06-29 DIAGNOSIS — IMO0002 Reserved for concepts with insufficient information to code with codable children: Secondary | ICD-10-CM

## 2013-06-29 DIAGNOSIS — M1610 Unilateral primary osteoarthritis, unspecified hip: Secondary | ICD-10-CM | POA: Insufficient documentation

## 2013-06-29 DIAGNOSIS — M5416 Radiculopathy, lumbar region: Secondary | ICD-10-CM

## 2013-06-29 DIAGNOSIS — M25559 Pain in unspecified hip: Secondary | ICD-10-CM

## 2013-06-29 DIAGNOSIS — M5442 Lumbago with sciatica, left side: Secondary | ICD-10-CM

## 2013-06-29 DIAGNOSIS — M169 Osteoarthritis of hip, unspecified: Secondary | ICD-10-CM | POA: Insufficient documentation

## 2013-06-29 NOTE — Patient Instructions (Signed)
You have lumbar radiculopathy (a pinched nerve in your low back). Take tylenol for baseline pain relief (1-2 extra strength tabs 3x/day) A prednisone dose pack is the best option for immediate relief and may be prescribed - call me if you want to try this.  Aleve 2 tabs twice a day OR ibuprofen 600mg  three times a day with food for pain and inflammation. Capsaicin is a topical medication that can help - is to be applied four times a day. Stay as active as possible. Physical therapy has been shown to be helpful as well - start this and do home exercises on days you don't go. Strengthening of low back muscles, abdominal musculature are key for long term pain relief. If not improving, will consider further imaging (MRI). Follow up with me in 6 weeks at the latest.

## 2013-07-05 ENCOUNTER — Encounter: Payer: Self-pay | Admitting: Family Medicine

## 2013-07-05 DIAGNOSIS — M545 Low back pain, unspecified: Secondary | ICD-10-CM | POA: Insufficient documentation

## 2013-07-05 NOTE — Progress Notes (Signed)
Patient ID: [name], male   DOB: 03-Jun-1955, 58 y.o.   MRN: [id number]  PCP: Annye Asa, MD  Subjective:   HPI: Patient is a 58 y.o. male here for left hip pain.  Patient denies known injury. Reports over past several days has developed pain going from groin and buttock down left leg. Associated with tingling and numbness. Worse if sits for a long time then stands up. Worse with turning body within groin/hip. No bowel/bladder dysfunction. No stiffness. Taking ibuprofen.  Past Medical History  Diagnosis Date  . Hyperlipidemia   . Hypertension   . Diabetes mellitus   . GERD (gastroesophageal reflux disease)   . Gout   . Hepatic steatosis     Current Outpatient Prescriptions on File Prior to Visit  Medication Sig Dispense Refill  . allopurinol (ZYLOPRIM) 300 MG tablet Take 150 mg by mouth daily.       Marland Kitchen amLODipine (NORVASC) 10 MG tablet Take 5 mg by mouth 3 (three) times a week.      Marland Kitchen aspirin 81 MG tablet Take 81 mg by mouth daily.        Marland Kitchen atorvastatin (LIPITOR) 20 MG tablet TAKE 1 TABLET BY MOUTH ONCE DAILY  90 tablet  1  . benazepril (LOTENSIN) 20 MG tablet TAKE 1 TABLET BY MOUTH DAILY  90 tablet  2  . FLUoxetine (PROZAC) 40 MG capsule Take 1 capsule (40 mg total) by mouth daily.  30 capsule  3  . glucose blood (TRUETEST TEST) test strip Check Blood Sugar once daily as directed, DX: 250.00  100 each  3  . Lancets MISC TrueTest Lancets: Check blood sugar 1-2 x daily  200 each  11  . Misc Natural Products (FOCUSED MIND PO) Take 6 tablets by mouth daily.       . Multiple Vitamins-Iron (MULTIVITAMINS WITH IRON) TABS Take 1 tablet by mouth daily.  30 tablet  0  . NON FORMULARY Trueresult Test Strips: check blood sugar daily as directed       . pantoprazole (PROTONIX) 40 MG tablet TAKE 1 TABLET BY MOUTH ONCE DAILY. REPLACES [place].  90 tablet  1  . polyethylene glycol powder (GLYCOLAX/MIRALAX) powder USE AS DIRECTED  850 g  11  . pregabalin (LYRICA) 75 MG capsule Take  75 mg by mouth 2 (two) times daily.       No current facility-administered medications on file prior to visit.    Past Surgical History  Procedure Laterality Date  . 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 Cervical fusion      Dr Sherwood Gambler  . Lumbar fusion       Dr Sherwood Gambler  . Esophageal dilation  2007  . Colonoscopy  2007    negative; [facility]    Allergies  Allergen Reactions  . Prednisone     History   Social History  . Marital Status: Married    Spouse Name: N/A    Number of Children: N/A  . Years of Education: N/A   Occupational History  . Not on file.   Social History Main Topics  . Smoking status: Former Smoker    Quit date: 01/06/1976  . Smokeless tobacco: Never Used     Comment: smoked 35 years ago as of 2013   . Alcohol Use: No  . Drug Use: No  . Sexual Activity: Not on file   Other Topics Concern  . Not on file   Social History  Narrative  . No narrative on file    Family History  Problem Relation Age of Onset  . Heart disease Father     pacer  . Urolithiasis Father   . Heart attack Father   . Aneurysm Mother 52    CNS aneurysm  . Sudden death Mother   . Diabetes Sister   . Cancer Maternal Uncle     ? primary  . Cancer Paternal Uncle     ? primary  . Hyperlipidemia Neg Hx   . Hypertension Neg Hx     BP 123/83  Pulse 69  Ht 6' (1.829 m)  Wt 227 lb (102.967 kg)  BMI 30.78 kg/m2  Review of Systems: See HPI above.    Objective:  Physical Exam:  Gen: NAD  Back/L hip: No gross deformity, scoliosis. TTP mildly left lumbar paraspinal region.  No hip, other TTP.  No midline or bony TTP. FROM back without pain.  Minimal limitation hip motion - some pain with full ER. Strength LEs 5/5 all muscle groups.   2+ MSRs in patellar and achilles tendons, equal bilaterally. Negative SLRs. Sensation intact to light touch bilaterally. Mild positive left logroll. Negative fabers and piriformis stretches.     Assessment & Plan:  1. Low back/left hip pain - he does have history and exam elements that suggest both lumbar radiculopathy and very mild hip arthritis.  His arthritis is very mild on radiographs too.  Will start with conservative treatment - physical therapy, nsaids, capsaicin, tylenol.  Consider MRI if not improving.  F/u in 6 weeks.

## 2013-07-05 NOTE — Assessment & Plan Note (Signed)
>>  ASSESSMENT AND PLAN FOR CHRONIC LOW BACK PAIN- W LUMBAR RADICULOPATHY WRITTEN ON 07/05/2013  1:33 PM BY HUDNALL, SHANE R, MD  he does have history and exam elements that suggest both lumbar radiculopathy and very mild hip arthritis.  His arthritis is very mild on radiographs too.  Will start with conservative treatment - physical therapy, nsaids, capsaicin, tylenol.  Consider MRI if not improving.  F/u in 6 weeks.

## 2013-07-05 NOTE — Assessment & Plan Note (Signed)
he does have history and exam elements that suggest both lumbar radiculopathy and very mild hip arthritis.  His arthritis is very mild on radiographs too.  Will start with conservative treatment - physical therapy, nsaids, capsaicin, tylenol.  Consider MRI if not improving.  F/u in 6 weeks.

## 2013-07-17 ENCOUNTER — Other Ambulatory Visit: Payer: Self-pay | Admitting: General Practice

## 2013-07-17 ENCOUNTER — Other Ambulatory Visit: Payer: Self-pay | Admitting: Internal Medicine

## 2013-07-17 MED ORDER — LANCETS MISC: Status: DC

## 2013-07-17 MED ORDER — BENAZEPRIL HCL 20 MG PO TABS: ORAL_TABLET | ORAL | Status: DC

## 2013-07-24 ENCOUNTER — Telehealth: Payer: Self-pay

## 2013-07-24 DIAGNOSIS — E785 Hyperlipidemia, unspecified: Secondary | ICD-10-CM

## 2013-07-24 DIAGNOSIS — E119 Type 2 diabetes mellitus without complications: Secondary | ICD-10-CM

## 2013-07-24 NOTE — Telephone Encounter (Signed)
Diabetic bundle   mychart message sent: pt needs A1C and lipid panel drawn  Labs ordered

## 2013-07-31 ENCOUNTER — Other Ambulatory Visit: Payer: Self-pay | Admitting: Internal Medicine

## 2013-08-01 ENCOUNTER — Other Ambulatory Visit: Payer: Self-pay | Admitting: General Practice

## 2013-08-01 MED ORDER — ATORVASTATIN CALCIUM 20 MG PO TABS: ORAL_TABLET | ORAL | Status: DC

## 2013-08-07 ENCOUNTER — Other Ambulatory Visit: Payer: Self-pay | Admitting: Internal Medicine

## 2013-08-10 ENCOUNTER — Other Ambulatory Visit: Payer: Self-pay | Admitting: General Practice

## 2013-08-10 ENCOUNTER — Ambulatory Visit: Payer: 59 | Admitting: Family Medicine

## 2013-08-10 MED ORDER — PANTOPRAZOLE SODIUM 40 MG PO TBEC: DELAYED_RELEASE_TABLET | ORAL | Status: DC

## 2013-08-11 ENCOUNTER — Other Ambulatory Visit (INDEPENDENT_AMBULATORY_CARE_PROVIDER_SITE_OTHER): Payer: 59

## 2013-08-11 DIAGNOSIS — E119 Type 2 diabetes mellitus without complications: Secondary | ICD-10-CM

## 2013-08-11 DIAGNOSIS — E785 Hyperlipidemia, unspecified: Secondary | ICD-10-CM

## 2013-08-11 LAB — LIPID PANEL
Cholesterol: 184 mg/dL (ref 0–200)
HDL: 39.6 mg/dL (ref >39.00)
LDL Cholesterol: 130 mg/dL — ABNORMAL HIGH (ref 0–99)
NonHDL: 144.4
Total CHOL/HDL Ratio: 5
Triglycerides: 70 mg/dL (ref 0.0–149.0)
VLDL: 14 mg/dL (ref 0.0–40.0)

## 2013-08-11 LAB — HEMOGLOBIN A1C: Hgb A1c MFr Bld: 6.5 % (ref 4.6–6.5)

## 2013-08-23 ENCOUNTER — Other Ambulatory Visit: Payer: Self-pay | Admitting: Internal Medicine

## 2013-08-29 ENCOUNTER — Other Ambulatory Visit: Payer: Self-pay | Admitting: General Practice

## 2013-08-29 MED ORDER — POLYETHYLENE GLYCOL 3350 17 GM/SCOOP PO POWD: ORAL | Status: DC

## 2013-09-07 ENCOUNTER — Other Ambulatory Visit: Payer: Self-pay | Admitting: Family Medicine

## 2013-09-07 NOTE — Telephone Encounter (Signed)
Med filled.  

## 2013-09-12 ENCOUNTER — Other Ambulatory Visit: Payer: Self-pay | Admitting: Family Medicine

## 2013-09-13 NOTE — Telephone Encounter (Signed)
Med filled.  

## 2013-09-14 ENCOUNTER — Ambulatory Visit (INDEPENDENT_AMBULATORY_CARE_PROVIDER_SITE_OTHER): Payer: 59 | Admitting: Family Medicine

## 2013-09-14 VITALS — BP 121/77 | HR 68 | Wt 234.0 lb

## 2013-09-14 DIAGNOSIS — E119 Type 2 diabetes mellitus without complications: Secondary | ICD-10-CM

## 2013-09-14 NOTE — Progress Notes (Signed)
Patient presents for 3 mo f/u DM as part of the employee sponsored [employer] to [employer]. Medications have been reviewed. I have also discussed lifestyle interventions such as diet and exercise. Full documentation of this visit can be found in the [employer] documenting system through [facility]). However specifics from this visit include the following:  Diabetes Mellitus:   POC A1C 6.2, at goal. Decreased from 6.5 Dr. Virgil Benedict visit in June. Patient is still controlling diabetes only by diet and exercise. He had slacked on exercise but is now picking back up. plan 1.) continue exercising, goal 150 mins/week. 2) f/i 3 mo Hyperlipidemia: LDL has increased but dose is appropriate per newest guidelines. Will improve as patient starts to exercise more.  Patient has set a series of personal goals and will f/u in 3 mo for further review of DM

## 2013-10-19 NOTE — Progress Notes (Signed)
Patient ID: [name], male   DOB: [birth year], 58 y.o.   MRN: [id number] Reviewed: Agree with the documentation and management of our [place].

## 2013-10-23 ENCOUNTER — Other Ambulatory Visit: Payer: Self-pay | Admitting: Internal Medicine

## 2013-10-23 ENCOUNTER — Other Ambulatory Visit: Payer: Self-pay | Admitting: General Practice

## 2013-10-23 MED ORDER — AMLODIPINE BESYLATE 10 MG PO TABS: 5.0000 mg | ORAL_TABLET | ORAL | Status: DC

## 2013-11-06 ENCOUNTER — Encounter: Payer: Self-pay | Admitting: Family Medicine

## 2013-11-06 ENCOUNTER — Ambulatory Visit (INDEPENDENT_AMBULATORY_CARE_PROVIDER_SITE_OTHER): Payer: 59 | Admitting: Family Medicine

## 2013-11-06 VITALS — BP 124/70 | HR 71 | Temp 98.9°F | Resp 16 | Ht 70.5 in | Wt 238.5 lb

## 2013-11-06 DIAGNOSIS — E559 Vitamin D deficiency, unspecified: Secondary | ICD-10-CM

## 2013-11-06 DIAGNOSIS — I10 Essential (primary) hypertension: Secondary | ICD-10-CM

## 2013-11-06 DIAGNOSIS — Z0000 Encounter for general adult medical examination without abnormal findings: Secondary | ICD-10-CM

## 2013-11-06 DIAGNOSIS — E1129 Type 2 diabetes mellitus with other diabetic kidney complication: Secondary | ICD-10-CM

## 2013-11-06 DIAGNOSIS — E1169 Type 2 diabetes mellitus with other specified complication: Secondary | ICD-10-CM

## 2013-11-06 DIAGNOSIS — E785 Hyperlipidemia, unspecified: Secondary | ICD-10-CM

## 2013-11-06 LAB — LIPID PANEL
Cholesterol: 132 mg/dL (ref 0–200)
HDL: 39.8 mg/dL (ref >39.00)
LDL Cholesterol: 82 mg/dL (ref 0–99)
NonHDL: 92.2
Total CHOL/HDL Ratio: 3
Triglycerides: 52 mg/dL (ref 0.0–149.0)
VLDL: 10.4 mg/dL (ref 0.0–40.0)

## 2013-11-06 LAB — CBC WITH DIFFERENTIAL/PLATELET
Basophils Absolute: 0 10*3/uL (ref 0.0–0.1)
Basophils Relative: 0.5 % (ref 0.0–3.0)
Eosinophils Absolute: 0.1 10*3/uL (ref 0.0–0.7)
Eosinophils Relative: 2.2 % (ref 0.0–5.0)
HCT: 41.1 % (ref 39.0–52.0)
Hemoglobin: 13.2 g/dL (ref 13.0–17.0)
Lymphocytes Relative: 25.7 % (ref 12.0–46.0)
Lymphs Abs: 1.1 10*3/uL (ref 0.7–4.0)
MCHC: 32 g/dL (ref 30.0–36.0)
MCV: 85.6 fl (ref 78.0–100.0)
Monocytes Absolute: 0.4 10*3/uL (ref 0.1–1.0)
Monocytes Relative: 8.9 % (ref 3.0–12.0)
Neutro Abs: 2.6 10*3/uL (ref 1.4–7.7)
Neutrophils Relative %: 62.7 % (ref 43.0–77.0)
Platelets: 203 10*3/uL (ref 150.0–400.0)
RBC: 4.8 Mil/uL (ref 4.22–5.81)
RDW: 13.8 % (ref 11.5–15.5)
WBC: 4.2 10*3/uL (ref 4.0–10.5)

## 2013-11-06 LAB — HEPATIC FUNCTION PANEL
ALT: 21 U/L (ref 0–53)
AST: 26 U/L (ref 0–37)
Albumin: 3.8 g/dL (ref 3.5–5.2)
Alkaline Phosphatase: 82 U/L (ref 39–117)
Bilirubin, Direct: 0.1 mg/dL (ref 0.0–0.3)
Total Bilirubin: 0.6 mg/dL (ref 0.2–1.2)
Total Protein: 7.3 g/dL (ref 6.0–8.3)

## 2013-11-06 LAB — BASIC METABOLIC PANEL
BUN: 16 mg/dL (ref 6–23)
CO2: 28 mEq/L (ref 19–32)
Calcium: 9.6 mg/dL (ref 8.4–10.5)
Chloride: 105 mEq/L (ref 96–112)
Creatinine, Ser: 1.5 mg/dL (ref 0.4–1.5)
GFR: 61.61 mL/min (ref >60.00)
Glucose, Bld: 115 mg/dL — ABNORMAL HIGH (ref 70–99)
Potassium: 4.6 mEq/L (ref 3.5–5.1)
Sodium: 138 mEq/L (ref 135–145)

## 2013-11-06 LAB — VITAMIN D 25 HYDROXY (VIT D DEFICIENCY, FRACTURES): VITD: 48.43 ng/mL (ref 30.00–100.00)

## 2013-11-06 LAB — TSH: TSH: 1.36 u[IU]/mL (ref 0.35–4.50)

## 2013-11-06 LAB — HEMOGLOBIN A1C: Hgb A1c MFr Bld: 6.5 % (ref 4.6–6.5)

## 2013-11-06 LAB — PSA: PSA: 0.88 ng/mL (ref 0.10–4.00)

## 2013-11-06 NOTE — Progress Notes (Signed)
   Subjective:    Patient ID: [name], male    DOB: 12-02-1955, 58 y.o.   [name]  HPI CPE- UTD on colonoscopy.  UTD on diabetic eye exam.   Review of Systems Patient reports no vision/hearing changes, anorexia, fever ,adenopathy, persistant/recurrent hoarseness, swallowing issues, chest pain, palpitations, edema, persistant/recurrent cough, hemoptysis, dyspnea (rest,exertional, paroxysmal nocturnal), gastrointestinal  bleeding (melena, rectal bleeding), abdominal pain, excessive heart burn, GU symptoms (dysuria, hematuria, voiding/incontinence issues) syncope, focal weakness, memory loss, numbness & tingling, skin/hair/nail changes, depression, anxiety, abnormal bruising/bleeding.  + DDD w/ radicular pain on L side- following w/ ortho     Objective:   Physical Exam BP 124/70 mmHg  Pulse 71  Temp(Src) 98.9 F (37.2 C) (Oral)  Resp 16  Ht 5' 10.5" (1.791 m)  Wt 238 lb 8 oz (108.183 kg)  BMI 33.73 kg/m2  SpO2 96%  General Appearance:    Alert, cooperative, no distress, appears stated age  Head:    Normocephalic, without obvious abnormality, atraumatic  Eyes:    PERRL, conjunctiva/corneas clear, EOM's intact, fundi    benign, both eyes       Ears:    Normal TM's and external ear canals, both ears  Nose:   Nares normal, septum midline, mucosa normal, no drainage   or sinus tenderness  Throat:   Lips, mucosa, and tongue normal; teeth and gums normal  Neck:   Supple, symmetrical, trachea midline, no adenopathy;       thyroid:  No enlargement/tenderness/nodules  Back:     Symmetric, no curvature, ROM normal, no CVA tenderness  Lungs:     Clear to auscultation bilaterally, respirations unlabored  Chest wall:    No tenderness or deformity  Heart:    Regular rate and rhythm, S1 and S2 normal, no murmur, rub   or gallop  Abdomen:     Soft, non-tender, bowel sounds active all four quadrants,    no masses, no organomegaly  Genitalia:    Normal male without lesion, discharge  or tenderness  Rectal:    Normal tone, normal prostate, no masses or tenderness;  Extremities:   Extremities normal, atraumatic, no cyanosis or edema  Pulses:   2+ and symmetric all extremities  Skin:   Skin color, texture, turgor normal, no rashes or lesions  Lymph nodes:   Cervical, supraclavicular, and axillary nodes normal  Neurologic:   CNII-XII intact. Normal strength, sensation and reflexes      throughout          Assessment & Plan:

## 2013-11-06 NOTE — Patient Instructions (Signed)
Follow up in 3-4 months to recheck diabetes We'll notify you of your lab results and make any changes if needed Start taking Claritin or Zyrtec daily to decrease your nasal congestion and ear congestion Keep up the good work on healthy diet and regular exercise Call with any questions or concerns Happy [place]!!!

## 2013-11-06 NOTE — Assessment & Plan Note (Signed)
Hx of similar.  Check labs.  Replete prn

## 2013-11-06 NOTE — Progress Notes (Signed)
Pre visit review using our clinic review tool, if applicable. No additional management support is needed unless otherwise documented below in the visit note. 

## 2013-11-06 NOTE — Assessment & Plan Note (Signed)
>>  ASSESSMENT AND PLAN FOR DIABETES MELLITUS WITH RENAL MANIFESTATION ([facility]) WRITTEN ON 11/06/2013  9:04 AM BY Sheliah Hatch, MD  Chronic problem.  Not currently on meds.  Attempting to control w/ healthy diet and regular exercise.  On ACE for renal protection.  UTD on eye exam.  Check labs.  Adjust meds prn

## 2013-11-06 NOTE — Assessment & Plan Note (Signed)
Chronic problem.  Not currently on meds.  Attempting to control w/ healthy diet and regular exercise.  On ACE for renal protection.  UTD on eye exam.  Check labs.  Adjust meds prn

## 2013-11-06 NOTE — Assessment & Plan Note (Signed)
Pt's PE WNL.  UTD on colonoscopy.  Due for flu shot today.  Check labs.  Anticipatory guidance provided.

## 2013-11-06 NOTE — Assessment & Plan Note (Signed)
Chronic problem.  Well controlled.  Asymptomatic.

## 2013-11-06 NOTE — Assessment & Plan Note (Signed)
Chronic problem.  Tolerating statin w/o difficulty.  Check labs.  Adjust meds prn  

## 2013-12-06 ENCOUNTER — Encounter: Payer: Self-pay | Admitting: Family Medicine

## 2013-12-06 ENCOUNTER — Ambulatory Visit (INDEPENDENT_AMBULATORY_CARE_PROVIDER_SITE_OTHER): Payer: 59 | Admitting: Family Medicine

## 2013-12-06 VITALS — BP 128/82 | HR 81 | Temp 98.3°F | Resp 16 | Wt 240.1 lb

## 2013-12-06 DIAGNOSIS — J0100 Acute maxillary sinusitis, unspecified: Secondary | ICD-10-CM | POA: Insufficient documentation

## 2013-12-06 DIAGNOSIS — B360 Pityriasis versicolor: Secondary | ICD-10-CM | POA: Insufficient documentation

## 2013-12-06 MED ORDER — AMOXICILLIN 875 MG PO TABS: 875.0000 mg | ORAL_TABLET | Freq: Two times a day (BID) | ORAL | Status: DC

## 2013-12-06 MED ORDER — KETOCONAZOLE 2 % EX CREA: 1.0000 "application " | TOPICAL_CREAM | Freq: Every day | CUTANEOUS | Status: DC

## 2013-12-06 MED ORDER — PROMETHAZINE-DM 6.25-15 MG/5ML PO SYRP: 5.0000 mL | ORAL_SOLUTION | Freq: Four times a day (QID) | ORAL | Status: DC | PRN

## 2013-12-06 NOTE — Progress Notes (Signed)
Pre visit review using our clinic review tool, if applicable. No additional management support is needed unless otherwise documented below in the visit note. 

## 2013-12-06 NOTE — Progress Notes (Signed)
   Subjective:    Patient ID: [name], male    DOB: [birth year], 58 y.o.   [name]  HPI URI- sxs started over a week ago, getting worse rather than better.  + cough- productive of yellow sputum.  No fevers.  Having facial pressure/swelling, eyes were red and swollen this AM, now draining.  Bilateral ear pressure.  Rash- chest and back.  1st noticed 'months ago'.  But now spreading.  Not itchy.  Described as small circles.  No one else at home w/ similar sxs.   Review of Systems For ROS see HPI     Objective:   Physical Exam  Constitutional: He appears well-developed and well-nourished. No distress.  HENT:  Head: Normocephalic and atraumatic.  Right Ear: Tympanic membrane normal.  Left Ear: Tympanic membrane normal.  Nose: Mucosal edema and rhinorrhea present. Right sinus exhibits maxillary sinus tenderness and frontal sinus tenderness. Left sinus exhibits maxillary sinus tenderness and frontal sinus tenderness.  Mouth/Throat: Mucous membranes are normal. Oropharyngeal exudate and posterior oropharyngeal erythema present. No posterior oropharyngeal edema.  + PND  Eyes: Conjunctivae and EOM are normal. Pupils are equal, round, and reactive to light.  Neck: Normal range of motion. Neck supple.  Cardiovascular: Normal rate, regular rhythm and normal heart sounds.   Pulmonary/Chest: Effort normal and breath sounds normal. No respiratory distress. He has no wheezes.  + hacking cough  Lymphadenopathy:    He has no cervical adenopathy.  Skin: Skin is warm and dry. Rash (tinear versicolor on chest and back) noted.  Vitals reviewed.         Assessment & Plan:

## 2013-12-06 NOTE — Patient Instructions (Signed)
Follow up as needed Start the Amoxicillin twice daily- take w/ food Drink plenty of fluids REST! Mucinex DM for daytime cough Promethazine cough syrup for nights/weekends- will cause drowsiness Use the Ketoconazole cream daily on the rash Call with any questions or concerns Happy Holidays!!

## 2013-12-07 NOTE — Assessment & Plan Note (Signed)
New.  Pt's rash consistent w/ tinea versicolor.  Start topical ketoconazole.  Reviewed recurrent nature of rash.  Pt expressed understanding and is in agreement w/ plan.

## 2013-12-07 NOTE — Assessment & Plan Note (Signed)
New.  Pt's sxs and PE consistent w/ infxn.  Start abx.  Cough meds prn.  Reviewed supportive care and red flags that should prompt return.  Pt expressed understanding and is in agreement w/ plan.

## 2014-03-07 ENCOUNTER — Encounter: Payer: Self-pay | Admitting: Family Medicine

## 2014-03-07 ENCOUNTER — Other Ambulatory Visit: Payer: Self-pay | Admitting: Family Medicine

## 2014-03-07 ENCOUNTER — Ambulatory Visit (INDEPENDENT_AMBULATORY_CARE_PROVIDER_SITE_OTHER): Payer: Medicare HMO | Admitting: Family Medicine

## 2014-03-07 VITALS — BP 126/80 | HR 65 | Temp 98.3°F | Resp 16 | Wt 241.1 lb

## 2014-03-07 DIAGNOSIS — R5383 Other fatigue: Secondary | ICD-10-CM | POA: Insufficient documentation

## 2014-03-07 DIAGNOSIS — R7989 Other specified abnormal findings of blood chemistry: Secondary | ICD-10-CM

## 2014-03-07 DIAGNOSIS — E1121 Type 2 diabetes mellitus with diabetic nephropathy: Secondary | ICD-10-CM

## 2014-03-07 DIAGNOSIS — Z8249 Family history of ischemic heart disease and other diseases of the circulatory system: Secondary | ICD-10-CM | POA: Insufficient documentation

## 2014-03-07 LAB — CBC WITH DIFFERENTIAL/PLATELET
Basophils Absolute: 0 10*3/uL (ref 0.0–0.1)
Basophils Relative: 0.5 % (ref 0.0–3.0)
Eosinophils Absolute: 0.1 10*3/uL (ref 0.0–0.7)
Eosinophils Relative: 2 % (ref 0.0–5.0)
HCT: 41 % (ref 39.0–52.0)
Hemoglobin: 13.3 g/dL (ref 13.0–17.0)
Lymphocytes Relative: 26.1 % (ref 12.0–46.0)
Lymphs Abs: 1.2 10*3/uL (ref 0.7–4.0)
MCHC: 32.5 g/dL (ref 30.0–36.0)
MCV: 82.6 fl (ref 78.0–100.0)
Monocytes Absolute: 0.4 10*3/uL (ref 0.1–1.0)
Monocytes Relative: 9.1 % (ref 3.0–12.0)
Neutro Abs: 2.8 10*3/uL (ref 1.4–7.7)
Neutrophils Relative %: 62.3 % (ref 43.0–77.0)
Platelets: 221 10*3/uL (ref 150.0–400.0)
RBC: 4.96 Mil/uL (ref 4.22–5.81)
RDW: 13.9 % (ref 11.5–15.5)
WBC: 4.5 10*3/uL (ref 4.0–10.5)

## 2014-03-07 LAB — BASIC METABOLIC PANEL
BUN: 25 mg/dL — ABNORMAL HIGH (ref 6–23)
CO2: 26 mEq/L (ref 19–32)
Calcium: 10.4 mg/dL (ref 8.4–10.5)
Chloride: 106 mEq/L (ref 96–112)
Creatinine, Ser: 1.73 mg/dL — ABNORMAL HIGH (ref 0.40–1.50)
GFR: 52.2 mL/min — ABNORMAL LOW (ref >60.00)
Glucose, Bld: 124 mg/dL — ABNORMAL HIGH (ref 70–99)
Potassium: 4.5 mEq/L (ref 3.5–5.1)
Sodium: 137 mEq/L (ref 135–145)

## 2014-03-07 LAB — HEMOGLOBIN A1C: Hgb A1c MFr Bld: 6.8 % — ABNORMAL HIGH (ref 4.6–6.5)

## 2014-03-07 LAB — TSH: TSH: 1.48 u[IU]/mL (ref 0.35–4.50)

## 2014-03-07 MED ORDER — FLUOXETINE HCL 40 MG PO CAPS: 40.0000 mg | ORAL_CAPSULE | Freq: Every day | ORAL | Status: DC

## 2014-03-07 MED ORDER — TRAZODONE HCL 50 MG PO TABS: 25.0000 mg | ORAL_TABLET | Freq: Every evening | ORAL | Status: DC | PRN

## 2014-03-07 MED ORDER — ALLOPURINOL 300 MG PO TABS: 150.0000 mg | ORAL_TABLET | Freq: Every day | ORAL | Status: DC

## 2014-03-07 MED ORDER — GABAPENTIN 100 MG PO CAPS: 100.0000 mg | ORAL_CAPSULE | Freq: Three times a day (TID) | ORAL | Status: DC

## 2014-03-07 NOTE — Assessment & Plan Note (Signed)
Chronic problem.  Currently controlled w/ healthy diet and regular exercise.  UTD on eye exam.  On ACE for renal protection.  Currently asymptomatic w/ exception of excessive fatigue.  Check labs.  Adjust meds prn

## 2014-03-07 NOTE — Assessment & Plan Note (Signed)
>>  ASSESSMENT AND PLAN FOR DIABETES MELLITUS WITH RENAL MANIFESTATION ([facility]CC) WRITTEN ON 03/07/2014  9:40 AM BY Sheliah Hatch, MD  Chronic problem.  Currently controlled w/ healthy diet and regular exercise.  UTD on eye exam.  On ACE for renal protection.  Currently asymptomatic w/ exception of excessive fatigue.  Check labs.  Adjust meds prn

## 2014-03-07 NOTE — Progress Notes (Signed)
Pre visit review using our clinic review tool, if applicable. No additional management support is needed unless otherwise documented below in the visit note. 

## 2014-03-07 NOTE — Assessment & Plan Note (Signed)
New.  Unclear if this is due to pt's chronic depression, poor sleep, or other underlying issue.  Will start trazodone for sleep.  Check labs.  Will follow closely.

## 2014-03-07 NOTE — Progress Notes (Signed)
   Subjective:    Patient ID: [name], male    DOB: Dec 05, 1955, 59 y.o.   [name]  HPI DM- chronic problem, not currently on meds.  On ACE for renal protection.  UTD on eye exam.  No CP, SOB, HAs, visual changes.  No abd pain, N/V.  No sores on feet.  No numbness/tingling of hands/feet.  Fatigue- per wife's report, 'he's just tired all the time.  Even if he doesn't do anything'.  Wearing CPAP.  Pt has had difficulty sleeping and had some improvement w/ wife's trazodone.  Family hx CAD- 2 sisters, brother, and father w/ stents and bypass.  Sister w/ triple bypass at 33.   Review of Systems For ROS see HPI     Objective:   Physical Exam  Constitutional: He is oriented to person, place, and time. He appears well-developed and well-nourished. No distress.  HENT:  Head: Normocephalic and atraumatic.  Eyes: Conjunctivae and EOM are normal. Pupils are equal, round, and reactive to light.  Neck: Normal range of motion. Neck supple. No thyromegaly present.  Cardiovascular: Normal rate, regular rhythm, normal heart sounds and intact distal pulses.   No murmur heard. Pulmonary/Chest: Effort normal and breath sounds normal. No respiratory distress.  Abdominal: Soft. Bowel sounds are normal. He exhibits no distension.  Musculoskeletal: He exhibits no edema.  Lymphadenopathy:    He has no cervical adenopathy.  Neurological: He is alert and oriented to person, place, and time. No cranial nerve deficit.  Skin: Skin is warm and dry.  Psychiatric: He has a normal mood and affect. His behavior is normal.  Vitals reviewed.         Assessment & Plan:

## 2014-03-07 NOTE — Assessment & Plan Note (Signed)
New.  Pt has 2 sisters that required triple bypass in their 81s.  Pt has known risk factors- hyperlipidemia, DM.  Now w/ early CAD, will refer to cards for complete evaluation.  Pt and wife in agreement.

## 2014-03-07 NOTE — Patient Instructions (Addendum)
Follow up in 3-4 months to recheck sugar and cholesterol We'll notify you of your lab results and make any changes if needed We'll call you with your Cardiology appt Use the Trazodone as needed for sleep Call with any questions or concerns Happy [place]!!!

## 2014-03-20 ENCOUNTER — Other Ambulatory Visit: Payer: Self-pay | Admitting: Family Medicine

## 2014-03-20 NOTE — Telephone Encounter (Signed)
Med filled.  

## 2014-03-27 ENCOUNTER — Other Ambulatory Visit: Payer: Self-pay | Admitting: Family Medicine

## 2014-03-27 NOTE — Telephone Encounter (Signed)
Med filled.  

## 2014-04-08 NOTE — Progress Notes (Signed)
Patient ID: [name], male   DOB: Jun 03, 1955, 59 y.o.   MRN: [id number]     Cardiology Office Note   Date:  04/11/2014   ID:  [name], DOB 04-09-1955, MRN [id number]  PCP:  Annye Asa, MD  Cardiologist:   Jenkins Rouge, MD   No chief complaint on file.     History of Present Illness: [name] is a 59 y.o. male who presents for evaluation of CAD   Has elevated lipids and DM.  Family hx CAD- 2 sisters, brother, and father w/ stents and bypass. Sister w/ triple bypass at 20. Fatigue and difficulty sleeping  OSA using CPAP  A1c 6.8 on 03/07/14  Gest pain in chest occasionally that sounds muscular.  Felt fatigue and dyspnea since January.  May be depressed  On disability for back and not used to being at home.  Going to school ? [employer] counseling.  Likes to [employer].  He uses elliptical daily and this does not bring chest pain on  Compliant with BP meds labs followed by primary   03/07/14 CR 1.7  TSH normal   Past Medical History  Diagnosis Date  . Hyperlipidemia   . Hypertension   . Diabetes mellitus   . GERD (gastroesophageal reflux disease)   . Gout   . Hepatic steatosis     Past Surgical History  Procedure Laterality Date  . 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 Cervical fusion      Dr Sherwood Gambler  . Lumbar fusion       Dr Sherwood Gambler  . Esophageal dilation  2007  . Colonoscopy  2007    negative; [facility]     Current Outpatient Prescriptions  Medication Sig Dispense Refill  . allopurinol (ZYLOPRIM) 300 MG tablet Take 0.5 tablets (150 mg total) by mouth daily. 30 tablet 3  . amLODipine (NORVASC) 10 MG tablet Take 0.5 tablets (5 mg total) by mouth 3 (three) times a week. 90 tablet 1  . aspirin 81 MG tablet Take 81 mg by mouth daily.      Marland Kitchen atorvastatin (LIPITOR) 20 MG tablet TAKE 1 TABLET BY MOUTH ONCE DAILY 30 tablet 6  . benazepril (LOTENSIN) 20 MG tablet TAKE 1 TABLET BY MOUTH DAILY 90 tablet 2  . FLUoxetine (PROZAC) 40  MG capsule Take 1 capsule (40 mg total) by mouth daily. 30 capsule 3  . gabapentin (NEURONTIN) 100 MG capsule Take 1 capsule (100 mg total) by mouth 3 (three) times daily. 90 capsule 3  . ketoconazole (NIZORAL) 2 % cream Apply 1 application topically daily. 60 g 1  . Multiple Vitamins-Iron (MULTIVITAMINS WITH IRON) TABS Take 1 tablet by mouth daily. 30 tablet 0  . pantoprazole (PROTONIX) 40 MG tablet TAKE 1 TABLET BY MOUTH ONCE DAILY. REPLACES [place]. 90 tablet 1  . polyethylene glycol powder (GLYCOLAX/MIRALAX) powder USE AS DIRECTED 850 g 11  . traZODone (DESYREL) 50 MG tablet Take 0.5-1 tablets (25-50 mg total) by mouth at bedtime as needed for sleep. 30 tablet 3   No current facility-administered medications for this visit.    Allergies:   Prednisone    Social History:  The patient  reports that he quit smoking about 38 years ago. He has never used smokeless tobacco. He reports that he does not drink alcohol or use illicit drugs.   Family History:  The patient's family history includes Aneurysm (age of onset: 7) in his mother; Cancer in his maternal uncle  and paternal uncle; Diabetes in his sister; Heart attack in his father; Heart disease in his father; Sudden death in his mother; Urolithiasis in his father. There is no history of Hyperlipidemia or Hypertension.    ROS:  Please see the history of present illness.   Otherwise, review of systems are positive for none.   All other systems are reviewed and negative.    PHYSICAL EXAM: VS:  Ht 5\' 10"  (1.778 m)  Wt 238 lb (107.956 kg)  BMI 34.15 kg/m2 , BMI Body mass index is 34.15 kg/(m^2). GEN: Well nourished, well developed, in no acute distress HEENT: normal Neck: no JVD, carotid bruits, or masses Cardiac:  RRR; no murmurs, rubs, or gallops,no edema  Respiratory:  clear to auscultation bilaterally, normal work of breathing GI: soft, nontender, nondistended, + BS MS: no deformity or atrophy Skin: warm and dry, no rash Neuro:   Strength and sensation are intact Psych: euthymic mood, full affect   EKG:  03/23/13  SR normal ST segments  04/11/14  SR rate 67 PVC nonspecific ST changes    Recent Labs: 11/06/2013: ALT 21 03/07/2014: BUN 25*; Creatinine 1.73*; Hemoglobin 13.3; Platelets 221.0; Potassium 4.5; Sodium 137; TSH 1.48    Lipid Panel    Component Value Date/Time   CHOL 132 11/06/2013 0850   TRIG 52.0 11/06/2013 0850   HDL 39.80 11/06/2013 0850   CHOLHDL 3 11/06/2013 0850   VLDL 10.4 11/06/2013 0850   LDLCALC 82 11/06/2013 0850      Wt Readings from Last 3 Encounters:  04/11/14 238 lb (107.956 kg)  03/07/14 241 lb 2 oz (109.374 kg)  12/06/13 240 lb 2 oz (108.92 kg)      Other studies Reviewed: Additional studies/ records that were reviewed today include: Epic notes and labs .    ASSESSMENT AND PLAN:  1.  Chest pain:  Atypical ECG with PVC and nonspecific ST/T wave changes f/u stress myovue  2. Dyspnea :  Normal exam f/u echo to assess RV and LV function 3. HTN:  Well controlled continue current meds 4. CRF  F/u primary still on ACE  Consider renal duplex Combination of HTN/Diabetic renal disease 5. Chol  Continue statin    Current medicines are reviewed at length with the patient today.  The patient does not have concerns regarding medicines.  The following changes have been made:  no change  Labs/ tests ordered today include: Echo and stress myovue  No orders of the defined types were placed in this encounter.     Disposition:   FU with PRN    i    Signed, Jenkins Rouge, MD  04/11/2014 11:26 AM    [facility] [place], [place], [place]  [address] Phone: [phone]; Fax: [phone]

## 2014-04-11 ENCOUNTER — Encounter: Payer: Self-pay | Admitting: Cardiovascular Disease

## 2014-04-11 ENCOUNTER — Ambulatory Visit (INDEPENDENT_AMBULATORY_CARE_PROVIDER_SITE_OTHER): Payer: Medicare HMO | Admitting: Cardiovascular Disease

## 2014-04-11 VITALS — HR 67 | Ht 70.0 in | Wt 238.0 lb

## 2014-04-11 DIAGNOSIS — R0600 Dyspnea, unspecified: Secondary | ICD-10-CM | POA: Diagnosis not present

## 2014-04-11 DIAGNOSIS — I10 Essential (primary) hypertension: Secondary | ICD-10-CM

## 2014-04-11 DIAGNOSIS — R9431 Abnormal electrocardiogram [ECG] [EKG]: Secondary | ICD-10-CM | POA: Diagnosis not present

## 2014-04-11 DIAGNOSIS — R079 Chest pain, unspecified: Secondary | ICD-10-CM | POA: Diagnosis not present

## 2014-04-11 NOTE — Patient Instructions (Signed)
Your physician recommends that you schedule a follow-up appointment in:  AS NEEDED\  Your physician recommends that you continue on your current medications as directed. Please refer to the Current Medication list given to you today.  Your physician has requested that you have en exercise stress myoview. For further information please visit [email or web address]. Please follow instruction sheet, as given.   Your physician has requested that you have an echocardiogram. Echocardiography is a painless test that uses sound waves to create images of your heart. It provides your doctor with information about the size and shape of your heart and how well your heart's chambers and valves are working. This procedure takes approximately one hour. There are no restrictions for this procedure.

## 2014-04-14 ENCOUNTER — Emergency Department (INDEPENDENT_AMBULATORY_CARE_PROVIDER_SITE_OTHER): Payer: Medicare HMO

## 2014-04-14 ENCOUNTER — Encounter (HOSPITAL_COMMUNITY): Payer: Self-pay | Admitting: Emergency Medicine

## 2014-04-14 ENCOUNTER — Emergency Department (HOSPITAL_COMMUNITY)
Admission: EM | Admit: 2014-04-14 | Discharge: 2014-04-14 | Disposition: A | Discharge status: Home / Self Care | Payer: Medicare HMO | Source: Home / Self Care | Attending: Family Medicine | Admitting: Family Medicine

## 2014-04-14 DIAGNOSIS — J111 Influenza due to unidentified influenza virus with other respiratory manifestations: Secondary | ICD-10-CM | POA: Diagnosis not present

## 2014-04-14 DIAGNOSIS — R69 Illness, unspecified: Principal | ICD-10-CM

## 2014-04-14 MED ORDER — OSELTAMIVIR PHOSPHATE 75 MG PO CAPS: 75.0000 mg | ORAL_CAPSULE | Freq: Two times a day (BID) | ORAL | Status: DC

## 2014-04-14 MED ORDER — ACETAMINOPHEN 325 MG PO TABS
ORAL_TABLET | ORAL | Status: AC
  Filled 2014-04-14: qty 3

## 2014-04-14 MED ORDER — ACETAMINOPHEN 325 MG PO TABS
975.0000 mg | ORAL_TABLET | Freq: Once | ORAL | Status: AC
  Administered 2014-04-14: 975 mg via ORAL

## 2014-04-14 NOTE — ED Provider Notes (Addendum)
[name] is a 59 y.o. male who presents to Urgent Care today for fevers chills bodyaches headache and congestion starting today. He notes minimal shortness of breath. No chest pains or palpitations. He has not tried any medications yet.   Past Medical History  Diagnosis Date  . Hyperlipidemia   . Hypertension   . Diabetes mellitus   . GERD (gastroesophageal reflux disease)   . Gout   . Hepatic steatosis    Past Surgical History  Procedure Laterality Date  . 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 Cervical fusion      Dr Sherwood Gambler  . Lumbar fusion       Dr Sherwood Gambler  . Esophageal dilation  2007  . Colonoscopy  2007    negative; [facility]   History  Substance Use Topics  . Smoking status: Former Smoker    Quit date: 01/06/1976  . Smokeless tobacco: Never Used     Comment: smoked 35 years ago as of 2013   . Alcohol Use: No   ROS as above Medications: No current facility-administered medications for this encounter.   Current Outpatient Prescriptions  Medication Sig Dispense Refill  . amLODipine (NORVASC) 10 MG tablet Take 0.5 tablets (5 mg total) by mouth 3 (three) times a week. 90 tablet 1  . aspirin 81 MG tablet Take 81 mg by mouth daily.      Marland Kitchen atorvastatin (LIPITOR) 20 MG tablet TAKE 1 TABLET BY MOUTH ONCE DAILY 30 tablet 6  . benazepril (LOTENSIN) 20 MG tablet TAKE 1 TABLET BY MOUTH DAILY 90 tablet 2  . FLUoxetine (PROZAC) 40 MG capsule Take 1 capsule (40 mg total) by mouth daily. 30 capsule 3  . pantoprazole (PROTONIX) 40 MG tablet TAKE 1 TABLET BY MOUTH ONCE DAILY. REPLACES [place]. 90 tablet 1  . traZODone (DESYREL) 50 MG tablet Take 0.5-1 tablets (25-50 mg total) by mouth at bedtime as needed for sleep. 30 tablet 3  . allopurinol (ZYLOPRIM) 300 MG tablet Take 0.5 tablets (150 mg total) by mouth daily. 30 tablet 3  . gabapentin (NEURONTIN) 100 MG capsule Take 1 capsule (100 mg total) by mouth 3 (three) times daily. 90 capsule 3   . ketoconazole (NIZORAL) 2 % cream Apply 1 application topically daily. 60 g 1  . Multiple Vitamins-Iron (MULTIVITAMINS WITH IRON) TABS Take 1 tablet by mouth daily. 30 tablet 0  . oseltamivir (TAMIFLU) 75 MG capsule Take 1 capsule (75 mg total) by mouth every 12 (twelve) hours. 10 capsule 0  . polyethylene glycol powder (GLYCOLAX/MIRALAX) powder USE AS DIRECTED 850 g 11   Allergies  Allergen Reactions  . Prednisone      Exam:  BP 144/83 mmHg  Pulse 85  Temp(Src) 102.5 F (39.2 C) (Oral)  Resp 20  SpO2 95% Gen: Well NAD HEENT: EOMI,  MMM clear nasal discharge. Lungs: Normal work of breathing. CTABL Heart: RRR no MRG Abd: NABS, Soft. Nondistended, Nontender Exts: Brisk capillary refill, warm and well perfused.   Patient was given 975 mg of Tylenol prior to discharge and felt better  Two-view chest x-ray preliminary read after discussion with radiologist negative for pneumonia.  No results found for this or any previous visit (from the past 24 hour(s)). Dg Chest 2 View  04/14/2014   CLINICAL DATA:  Fever  EXAM: CHEST  2 VIEW  COMPARISON:  01/24/2010  FINDINGS: The heart size and mediastinal contours are within normal limits. Both lungs are clear. The  visualized skeletal structures are unremarkable.  IMPRESSION: No active cardiopulmonary disease.   Electronically Signed   By: Kerby Moors M.D.   On: 04/14/2014 20:01    Assessment and Plan: 59 y.o. male with influenza-like illness. Treat with Tamiflu and NSAIDs or Tylenol. Return as needed.  Discussed warning signs or symptoms. Please see discharge instructions. Patient expresses understanding.     Gregor Hams, MD 04/14/14 2010  Gregor Hams, MD 04/14/14 2014

## 2014-04-14 NOTE — ED Notes (Signed)
C/o cold sx onset today Sx include fevers, dizziness, HA, chills, nauseas, SOB, BA Alert, no signs of acute distress.

## 2014-04-14 NOTE — Discharge Instructions (Signed)
Thank you for coming in today. Use Tylenol Aleve or ibuprofen for pain and fever control. Take Tamiflu as directed. Return as needed. Call or go to the emergency room if you get worse, have trouble breathing, have chest pains, or palpitations.   Influenza Influenza ("the flu") is a viral infection of the respiratory tract. It occurs more often in winter months because people spend more time in close contact with one another. Influenza can make you feel very sick. Influenza easily spreads from person to person (contagious). CAUSES  Influenza is caused by a virus that infects the respiratory tract. You can catch the virus by breathing in droplets from an infected person's cough or sneeze. You can also catch the virus by touching something that was recently contaminated with the virus and then touching your mouth, nose, or eyes. RISKS AND COMPLICATIONS You may be at risk for a more severe case of influenza if you smoke cigarettes, have diabetes, have chronic heart disease (such as heart failure) or lung disease (such as asthma), or if you have a weakened immune system. Elderly people and pregnant women are also at risk for more serious infections. The most common problem of influenza is a lung infection (pneumonia). Sometimes, this problem can require emergency medical care and may be life threatening. SIGNS AND SYMPTOMS  Symptoms typically last 4 to 10 days and may include:  Fever.  Chills.  Headache, body aches, and muscle aches.  Sore throat.  Chest discomfort and cough.  Poor appetite.  Weakness or feeling tired.  Dizziness.  Nausea or vomiting. DIAGNOSIS  Diagnosis of influenza is often made based on your history and a physical exam. A nose or throat swab test can be done to confirm the diagnosis. TREATMENT  In mild cases, influenza goes away on its own. Treatment is directed at relieving symptoms. For more severe cases, your health care provider may prescribe antiviral medicines  to shorten the sickness. Antibiotic medicines are not effective because the infection is caused by a virus, not by bacteria. HOME CARE INSTRUCTIONS  Take medicines only as directed by your health care provider.  Use a cool mist humidifier to make breathing easier.  Get plenty of rest until your temperature returns to normal. This usually takes 3 to 4 days.  Drink enough fluid to keep your urine clear or pale yellow.  Cover yourmouth and nosewhen coughing or sneezing,and wash your handswellto prevent thevirusfrom spreading.  Stay homefromwork orschool untilthe fever is gonefor at least 66full day. PREVENTION  An annual influenza vaccination (flu shot) is the best way to avoid getting influenza. An annual flu shot is now routinely recommended for all adults in the [place] IF:  You experiencechest pain, yourcough worsens,or you producemore mucus.  Youhave nausea,vomiting, ordiarrhea.  Your fever returns or gets worse. SEEK IMMEDIATE MEDICAL CARE IF:  You havetrouble breathing, you become short of breath,or your skin ornails becomebluish.  You have severe painor stiffnessin the neck.  You develop a sudden headache, or pain in the face or ear.  You have nausea or vomiting that you cannot control. MAKE SURE YOU:   Understand these instructions.  Will watch your condition.  Will get help right away if you are not doing well or get worse. Document Released: 12/20/1999 Document Revised: 05/08/2013 Document Reviewed: 03/23/2011 [facility] Patient Information 2015 [place], [place]. This information is not intended to replace advice given to you by your health care provider. Make sure you discuss any questions you have with your  health care provider. ° °

## 2014-04-19 ENCOUNTER — Encounter: Payer: Self-pay | Admitting: Cardiology

## 2014-04-20 ENCOUNTER — Ambulatory Visit (INDEPENDENT_AMBULATORY_CARE_PROVIDER_SITE_OTHER): Payer: Medicare HMO | Admitting: Medical

## 2014-04-20 VITALS — BP 138/78 | HR 73 | Ht 70.0 in | Wt 232.6 lb

## 2014-04-20 DIAGNOSIS — N390 Urinary tract infection, site not specified: Secondary | ICD-10-CM | POA: Diagnosis not present

## 2014-04-20 DIAGNOSIS — R350 Frequency of micturition: Secondary | ICD-10-CM | POA: Diagnosis not present

## 2014-04-20 DIAGNOSIS — R82998 Other abnormal findings in urine: Secondary | ICD-10-CM

## 2014-04-20 DIAGNOSIS — N3000 Acute cystitis without hematuria: Secondary | ICD-10-CM | POA: Diagnosis not present

## 2014-04-20 DIAGNOSIS — N419 Inflammatory disease of prostate, unspecified: Secondary | ICD-10-CM | POA: Insufficient documentation

## 2014-04-20 DIAGNOSIS — N410 Acute prostatitis: Secondary | ICD-10-CM | POA: Diagnosis not present

## 2014-04-20 DIAGNOSIS — R739 Hyperglycemia, unspecified: Secondary | ICD-10-CM

## 2014-04-20 LAB — COMPLETE METABOLIC PANEL WITH GFR
ALT: 20 U/L (ref 0–53)
AST: 19 U/L (ref 0–37)
Albumin: 4.1 g/dL (ref 3.5–5.2)
Alkaline Phosphatase: 96 U/L (ref 39–117)
BUN: 24 mg/dL — ABNORMAL HIGH (ref 6–23)
CO2: 23 mEq/L (ref 19–32)
Calcium: 9.4 mg/dL (ref 8.4–10.5)
Chloride: 107 mEq/L (ref 96–112)
Creat: 1.66 mg/dL — ABNORMAL HIGH (ref 0.50–1.35)
GFR, Est African American: 51 mL/min — ABNORMAL LOW
GFR, Est Non African American: 44 mL/min — ABNORMAL LOW
Glucose, Bld: 105 mg/dL — ABNORMAL HIGH (ref 70–99)
Potassium: 4.6 mEq/L (ref 3.5–5.3)
Sodium: 140 mEq/L (ref 135–145)
Total Bilirubin: 0.3 mg/dL (ref 0.2–1.2)
Total Protein: 7.1 g/dL (ref 6.0–8.3)

## 2014-04-20 LAB — POCT URINALYSIS DIPSTICK
Bilirubin, UA: NEGATIVE
Blood, UA: NEGATIVE
Glucose, UA: NEGATIVE
Ketones, UA: NEGATIVE
Nitrite, UA: NEGATIVE
Protein, UA: 15
Spec Grav, UA: 1.025
Urobilinogen, UA: 0.2
pH, UA: 5

## 2014-04-20 MED ORDER — CIPROFLOXACIN HCL 500 MG PO TABS: 500.0000 mg | ORAL_TABLET | Freq: Two times a day (BID) | ORAL | Status: DC

## 2014-04-20 NOTE — Patient Instructions (Signed)
UTI (urinary tract infection) Possible now and may be associated with prostatitis. Rx cipro.   Prostatitis rx cipro and get psa today.    Get psa today and cmp.(not cmp to check your kidney function and sugar level since a1- c has been mild elevated.  Follow up in 7-10 days or as needed.

## 2014-04-20 NOTE — Progress Notes (Signed)
Subjective:    Patient ID: [name], male    DOB: 09/23/1955, 59 y.o.   [name]  HPI    Pt in stating he went to urgent care last week. He was diagnosed with flu. He was given med for flu. He did feel better.  Body aches, chills, congestion got better.  Along with flu like symptoms he had some frequent urination about one week ago all day. The frequent urination continue but it is less now but still moderate. A week ago was very frequent. Now has some burning on urination. No perineum pain. No Testicle pain. Some lower back pain.  Review of Systems  Constitutional: Negative for fever, chills and fatigue.  Respiratory: Negative for cough, choking and wheezing.   Cardiovascular: Negative for chest pain and palpitations.  Gastrointestinal: Negative for nausea, vomiting and abdominal pain.  Endocrine: Positive for polyuria. Negative for polydipsia and polyphagia.  Genitourinary: Positive for dysuria.  Musculoskeletal: Positive for back pain. Negative for joint swelling and neck stiffness.       Mild lower back painn  Skin: Negative for pallor.  Hematological: Negative for adenopathy. Does not bruise/bleed easily.   Past Medical History  Diagnosis Date  . Hyperlipidemia   . Hypertension   . Diabetes mellitus   . GERD (gastroesophageal reflux disease)   . Gout   . Hepatic steatosis     History   Social History  . Marital Status: Married    Spouse Name: N/A  . Number of Children: N/A  . Years of Education: N/A   Occupational History  . Not on file.   Social History Main Topics  . Smoking status: Former Smoker    Quit date: 01/06/1976  . Smokeless tobacco: Never Used     Comment: smoked 35 years ago as of 2013   . Alcohol Use: No  . Drug Use: No  . Sexual Activity: Not on file   Other Topics Concern  . Not on file   Social History Narrative    Past Surgical History  Procedure Laterality Date  . Finger amputation  2003    Left Index finger  amputation & reattachment  . Cervical fusion      Dr Sherwood Gambler  . Lumbar fusion       Dr Sherwood Gambler  . Esophageal dilation  2007  . Colonoscopy  2007    negative; [facility]    Family History  Problem Relation Age of Onset  . Heart disease Father     pacer  . Urolithiasis Father   . Heart attack Father   . Aneurysm Mother 12    CNS aneurysm  . Sudden death Mother   . Diabetes Sister   . Cancer Maternal Uncle     ? primary  . Cancer Paternal Uncle     ? primary  . Hyperlipidemia Neg Hx   . Hypertension Neg Hx     Allergies  Allergen Reactions  . Prednisone     Current Outpatient Prescriptions on File Prior to Visit  Medication Sig Dispense Refill  . allopurinol (ZYLOPRIM) 300 MG tablet Take 0.5 tablets (150 mg total) by mouth daily. 30 tablet 3  . amLODipine (NORVASC) 10 MG tablet Take 0.5 tablets (5 mg total) by mouth 3 (three) times a week. 90 tablet 1  . aspirin 81 MG tablet Take 81 mg by mouth daily.      Marland Kitchen atorvastatin (LIPITOR) 20 MG tablet TAKE 1 TABLET BY MOUTH ONCE DAILY 30 tablet 6  .  benazepril (LOTENSIN) 20 MG tablet TAKE 1 TABLET BY MOUTH DAILY 90 tablet 2  . FLUoxetine (PROZAC) 40 MG capsule Take 1 capsule (40 mg total) by mouth daily. 30 capsule 3  . gabapentin (NEURONTIN) 100 MG capsule Take 1 capsule (100 mg total) by mouth 3 (three) times daily. 90 capsule 3  . ketoconazole (NIZORAL) 2 % cream Apply 1 application topically daily. 60 g 1  . Multiple Vitamins-Iron (MULTIVITAMINS WITH IRON) TABS Take 1 tablet by mouth daily. 30 tablet 0  . pantoprazole (PROTONIX) 40 MG tablet TAKE 1 TABLET BY MOUTH ONCE DAILY. REPLACES [place]. 90 tablet 1  . polyethylene glycol powder (GLYCOLAX/MIRALAX) powder USE AS DIRECTED 850 g 11  . traZODone (DESYREL) 50 MG tablet Take 0.5-1 tablets (25-50 mg total) by mouth at bedtime as needed for sleep. 30 tablet 3   No current facility-administered medications on file prior to visit.    BP 138/78 mmHg  Pulse 73  Ht 5\' 10"   (1.778 m)  Wt 232 lb 9.6 oz (105.507 kg)  BMI 33.37 kg/m2  SpO2 97%      Objective:   Physical Exam  General Appearance- Not in acute distress.  HEENT Eyes- Scleraeral/Conjuntiva-bilat- Not Yellow. Mouth & Throat- Normal.  Chest and Lung Exam Auscultation: Breath sounds:-Normal. Adventitious sounds:- No Adventitious sounds.  Cardiovascular Auscultation:Rythm - Regular. Heart Sounds -Normal heart sounds.  Abdomen Inspection:-Inspection Normal.  Palpation/Perucssion: Palpation and Percussion of the abdomen reveal- Non Tender, No Rebound tenderness, No rigidity(Guarding) and No Palpable abdominal masses.  Liver:-Normal.  Spleen:- Normal.   Rectal Anorectal Exam:  External - normal external exam. Internal - very tight sphincter tone. No rectal mass. Prostate vvery small portion felt(reported moderate to severe pain, more so than prior DRE). Smooth and normal portion that I felt.  Genital exam- uncircumcised penis. Testicles nontender. Normal meatus. No dc.       Assessment & Plan:

## 2014-04-20 NOTE — Assessment & Plan Note (Addendum)
Possible now and may be associated with prostatitis. Rx cipro.

## 2014-04-20 NOTE — Progress Notes (Signed)
Pre visit review using our clinic review tool, if applicable. No additional management support is needed unless otherwise documented below in the visit note. 

## 2014-04-20 NOTE — Assessment & Plan Note (Signed)
rx cipro and get psa today.

## 2014-04-21 LAB — PSA: PSA: 8.8 ng/mL — ABNORMAL HIGH (ref <=4.00)

## 2014-04-22 LAB — URINE CULTURE
Colony Count: NO GROWTH
Organism ID, Bacteria: NO GROWTH

## 2014-04-23 ENCOUNTER — Other Ambulatory Visit: Payer: Self-pay | Admitting: Family Medicine

## 2014-04-23 ENCOUNTER — Ambulatory Visit (HOSPITAL_COMMUNITY): Payer: Medicare HMO | Attending: Cardiovascular Disease | Admitting: Radiology

## 2014-04-23 ENCOUNTER — Ambulatory Visit (HOSPITAL_BASED_OUTPATIENT_CLINIC_OR_DEPARTMENT_OTHER): Payer: Medicare HMO | Admitting: Radiology

## 2014-04-23 DIAGNOSIS — R079 Chest pain, unspecified: Secondary | ICD-10-CM | POA: Diagnosis not present

## 2014-04-23 DIAGNOSIS — E669 Obesity, unspecified: Secondary | ICD-10-CM | POA: Insufficient documentation

## 2014-04-23 DIAGNOSIS — R9431 Abnormal electrocardiogram [ECG] [EKG]: Secondary | ICD-10-CM

## 2014-04-23 DIAGNOSIS — I10 Essential (primary) hypertension: Secondary | ICD-10-CM | POA: Diagnosis not present

## 2014-04-23 DIAGNOSIS — E785 Hyperlipidemia, unspecified: Secondary | ICD-10-CM | POA: Insufficient documentation

## 2014-04-23 DIAGNOSIS — R5383 Other fatigue: Secondary | ICD-10-CM | POA: Diagnosis not present

## 2014-04-23 DIAGNOSIS — Z6833 Body mass index (BMI) 33.0-33.9, adult: Secondary | ICD-10-CM | POA: Diagnosis not present

## 2014-04-23 DIAGNOSIS — R0600 Dyspnea, unspecified: Secondary | ICD-10-CM

## 2014-04-23 DIAGNOSIS — E119 Type 2 diabetes mellitus without complications: Secondary | ICD-10-CM | POA: Diagnosis not present

## 2014-04-23 MED ORDER — TECHNETIUM TC 99M SESTAMIBI GENERIC - CARDIOLITE
11.0000 | Freq: Once | INTRAVENOUS | Status: AC | PRN
  Administered 2014-04-23: 11 via INTRAVENOUS

## 2014-04-23 MED ORDER — TECHNETIUM TC 99M SESTAMIBI GENERIC - CARDIOLITE
30.0000 | Freq: Once | INTRAVENOUS | Status: AC | PRN
  Administered 2014-04-23: 30 via INTRAVENOUS

## 2014-04-23 NOTE — Telephone Encounter (Signed)
Med filled.  

## 2014-04-23 NOTE — Progress Notes (Addendum)
[facility] [address] [place], [place] [address] [phone]    Cardiology Nuclear Med Study  [name] is a 59 y.o. male     MRN : [id number]     DOB: 1955/06/07  Procedure Date: 04/23/2014  Nuclear Med Background Indication for Stress Test:  Evaluation for Ischemia and Abnormal EKG History:  n/a Cardiac Risk Factors: Hypertension and NIDDM  Symptoms:  Chest Pain, DOE, Fatigue and SOB   Nuclear Pre-Procedure Caffeine/Decaff Intake:  None NPO After: 8:00pm   Lungs:  clear O2 Sat: 98% on room air. IV 0.9% NS with Angio Cath:  22g  IV Site: R Hand  IV Started by:  Matilde Haymaker, RN  Chest Size (in):  48 Cup Size: n/a  Height: 5\' 10"  (1.778 m)  Weight:  233 lb (105.688 kg)  BMI:  Body mass index is 33.43 kg/(m^2). Tech Comments:  n/a    Nuclear Med Study 1 or 2 day study: 1 day  Stress Test Type:  Stress  Reading MD: n/a  Order Authorizing Provider:  Verlin Dike  Resting Radionuclide: Technetium 9m Sestamibi  Resting Radionuclide Dose: 11.0 mCi   Stress Radionuclide:  Technetium 20m Sestamibi  Stress Radionuclide Dose: 33.0 mCi           Stress Protocol Rest HR: 48 Stress HR: 150  Rest BP: 115/69 Stress BP: 155/48  Exercise Time (min): 10:30 METS: 12.80   Predicted Max HR: 161 bpm % Max HR: 93.17 bpm Rate Pressure Product: 23250   Dose of Adenosine (mg):  n/a Dose of Lexiscan: n/a mg  Dose of Atropine (mg): n/a Dose of Dobutamine: n/a mcg/kg/min (at max HR)  Stress Test Technologist: Perrin Maltese, EMT-P  Nuclear Technologist:  Earl Many, CNMT     Rest Procedure:  Myocardial perfusion imaging was performed at rest 45 minutes following the intravenous administration of Technetium 62m Sestamibi. Rest ECG: Normal sinus rhythm. Normal EKG.  Stress Procedure:  The patient exercised on the treadmill utilizing the Bruce Protocol for 10:30 minutes. The patient stopped due to sob and denied any chest pain.   Technetium 7m Sestamibi was injected at peak exercise and myocardial perfusion imaging was performed after a brief delay. Stress ECG: No significant change from baseline ECG  QPS Raw Data Images:  Normal; no motion artifact; normal heart/lung ratio. Stress Images:  Normal homogeneous uptake in all areas of the myocardium. Rest Images:  Normal homogeneous uptake in all areas of the myocardium. Subtraction (SDS):  No evidence of ischemia. Transient Ischemic Dilatation (Normal <1.22):  0.96 Lung/Heart Ratio (Normal <0.45):  0.28  Quantitative Gated Spect Images QGS EDV:  133 ml QGS ESV:  72 ml  Impression Exercise Capacity:  Good exercise capacity. BP Response:  Normal blood pressure response. Clinical Symptoms:  Shortness of breath at peak stress ECG Impression:  No significant ST segment change suggestive of ischemia. Comparison with Prior Nuclear Study:  The study is compared with the result of the study from September, 2010.  Overall Impression:   The patient exercised well on the treadmill. There was no significant EKG change. The nuclear images revealed no scar or ischemia. This is a low risk scan. The ejection fraction is computed at 46%. The report from the study of September, 2010, noted an ejection fraction of 63%.  Echocardiogram could be considered for better assessment of resting LV function.  LV Ejection Fraction: 46%.  LV Wall Motion:  The ejection fraction is slightly decreased for this  technique. There are no focal abnormalities.  Dola Argyle, MD

## 2014-04-23 NOTE — Progress Notes (Signed)
Echocardiogram performed.  

## 2014-04-25 ENCOUNTER — Telehealth: Payer: Self-pay | Admitting: *Deleted

## 2014-04-25 NOTE — Telephone Encounter (Signed)
Normal Echo         ----- Message -----     From: Baxter Flattery     Sent: 04/24/2014  3:37 PM      To: Josue Hector, MD        PT AWARE OF ECHO RESULTS .Adonis Housekeeper

## 2014-04-30 ENCOUNTER — Other Ambulatory Visit: Payer: Self-pay | Admitting: Family Medicine

## 2014-04-30 NOTE — Telephone Encounter (Signed)
Med filled.  

## 2014-05-04 ENCOUNTER — Encounter: Payer: Self-pay | Admitting: Family Medicine

## 2014-05-04 ENCOUNTER — Ambulatory Visit (INDEPENDENT_AMBULATORY_CARE_PROVIDER_SITE_OTHER): Payer: Medicare HMO | Admitting: Family Medicine

## 2014-05-04 VITALS — BP 122/82 | HR 70 | Temp 97.9°F | Resp 16 | Wt 234.1 lb

## 2014-05-04 DIAGNOSIS — M5410 Radiculopathy, site unspecified: Secondary | ICD-10-CM | POA: Diagnosis not present

## 2014-05-04 DIAGNOSIS — N410 Acute prostatitis: Secondary | ICD-10-CM | POA: Diagnosis not present

## 2014-05-04 DIAGNOSIS — N400 Enlarged prostate without lower urinary tract symptoms: Secondary | ICD-10-CM

## 2014-05-04 LAB — PSA: PSA: 2.6 ng/mL (ref 0.10–4.00)

## 2014-05-04 MED ORDER — GABAPENTIN 100 MG PO CAPS: 200.0000 mg | ORAL_CAPSULE | Freq: Two times a day (BID) | ORAL | Status: DC

## 2014-05-04 NOTE — Assessment & Plan Note (Signed)
Pt is no longer symptomatic after completing cipro.  Due for repeat PSA.  If still elevated, will need urology appt.  Pt expressed understanding and is in agreement w/ plan.

## 2014-05-04 NOTE — Progress Notes (Signed)
Pre visit review using our clinic review tool, if applicable. No additional management support is needed unless otherwise documented below in the visit note. 

## 2014-05-04 NOTE — Progress Notes (Signed)
   Subjective:    Patient ID: Quinta Labella, male    DOB: 23-Sep-1955, 59 y.o.   [name]  HPI Elevated PSA- pt was seen 2 weeks ago and started on Cipro for presumed prostatitis.  PSA went from 0.8 -->8.  Pt denies burning, discomfort w/ urination.  Frequency has resolved and pt is rarely needing to wake up at night.  Due for recheck PSA level.  Back pain w/ radiculopathy- pt has increased gabapentin to 2 tabs QAM and 2 tabs QPM b/c 3 tabs daily was not effective.   Review of Systems For ROS see HPI     Objective:   Physical Exam  Constitutional: He is oriented to person, place, and time. He appears well-developed and well-nourished. No distress.  HENT:  Head: Normocephalic and atraumatic.  Cardiovascular: Intact distal pulses.   Pulmonary/Chest: No respiratory distress.  Abdominal: Soft. Bowel sounds are normal. He exhibits no distension. There is no tenderness. There is no rebound and no guarding.  Musculoskeletal: He exhibits no edema.  Neurological: He is alert and oriented to person, place, and time. No cranial nerve deficit. Coordination normal.  Skin: Skin is warm and dry.  Psychiatric: He has a normal mood and affect. His behavior is normal. Thought content normal.  Vitals reviewed.         Assessment & Plan:

## 2014-05-04 NOTE — Assessment & Plan Note (Signed)
Unchanged.  Pt has increased gabapentin to 2 tabs twice daily w/ some improvement in pain sxs.  He needs new script to reflect this change as he has been running out of meds early.  Script sent.

## 2014-05-04 NOTE — Patient Instructions (Signed)
Follow up as scheduled We'll notify you of your lab results and make any changes if needed If the PSA is still elevated- we'll refer to Dr Karsten Ro I changed to gapapentin to 2 pills twice daily Call with any questions or concerns Have a great weekend!

## 2014-07-02 ENCOUNTER — Other Ambulatory Visit: Payer: Self-pay | Admitting: Family Medicine

## 2014-07-02 NOTE — Telephone Encounter (Signed)
Med filled.  

## 2014-07-10 ENCOUNTER — Ambulatory Visit: Payer: Medicare HMO | Admitting: Family Medicine

## 2014-07-17 ENCOUNTER — Encounter: Payer: Self-pay | Admitting: Family Medicine

## 2014-07-17 ENCOUNTER — Ambulatory Visit (INDEPENDENT_AMBULATORY_CARE_PROVIDER_SITE_OTHER): Payer: Medicare HMO | Admitting: Family Medicine

## 2014-07-17 ENCOUNTER — Other Ambulatory Visit: Payer: Self-pay | Admitting: Family Medicine

## 2014-07-17 VITALS — BP 122/80 | HR 59 | Temp 98.1°F | Resp 16 | Ht 70.0 in | Wt 231.4 lb

## 2014-07-17 DIAGNOSIS — E1121 Type 2 diabetes mellitus with diabetic nephropathy: Secondary | ICD-10-CM | POA: Diagnosis not present

## 2014-07-17 DIAGNOSIS — E1169 Type 2 diabetes mellitus with other specified complication: Secondary | ICD-10-CM | POA: Diagnosis not present

## 2014-07-17 DIAGNOSIS — I10 Essential (primary) hypertension: Secondary | ICD-10-CM

## 2014-07-17 DIAGNOSIS — E785 Hyperlipidemia, unspecified: Secondary | ICD-10-CM | POA: Diagnosis not present

## 2014-07-17 LAB — CBC WITH DIFFERENTIAL/PLATELET
Basophils Absolute: 0 10*3/uL (ref 0.0–0.1)
Basophils Relative: 0.4 % (ref 0.0–3.0)
Eosinophils Absolute: 0.1 10*3/uL (ref 0.0–0.7)
Eosinophils Relative: 2.5 % (ref 0.0–5.0)
HCT: 39.8 % (ref 39.0–52.0)
Hemoglobin: 12.8 g/dL — ABNORMAL LOW (ref 13.0–17.0)
Lymphocytes Relative: 24 % (ref 12.0–46.0)
Lymphs Abs: 1 10*3/uL (ref 0.7–4.0)
MCHC: 32.3 g/dL (ref 30.0–36.0)
MCV: 82.7 fl (ref 78.0–100.0)
Monocytes Absolute: 0.3 10*3/uL (ref 0.1–1.0)
Monocytes Relative: 8.1 % (ref 3.0–12.0)
Neutro Abs: 2.7 10*3/uL (ref 1.4–7.7)
Neutrophils Relative %: 65 % (ref 43.0–77.0)
Platelets: 200 10*3/uL (ref 150.0–400.0)
RBC: 4.81 Mil/uL (ref 4.22–5.81)
RDW: 14.3 % (ref 11.5–15.5)
WBC: 4.2 10*3/uL (ref 4.0–10.5)

## 2014-07-17 LAB — LIPID PANEL
Cholesterol: 141 mg/dL (ref 0–200)
HDL: 39.5 mg/dL (ref >39.00)
LDL Cholesterol: 90 mg/dL (ref 0–99)
NonHDL: 101.5
Total CHOL/HDL Ratio: 4
Triglycerides: 59 mg/dL (ref 0.0–149.0)
VLDL: 11.8 mg/dL (ref 0.0–40.0)

## 2014-07-17 LAB — BASIC METABOLIC PANEL
BUN: 17 mg/dL (ref 6–23)
CO2: 29 mEq/L (ref 19–32)
Calcium: 9.6 mg/dL (ref 8.4–10.5)
Chloride: 104 mEq/L (ref 96–112)
Creatinine, Ser: 1.55 mg/dL — ABNORMAL HIGH (ref 0.40–1.50)
GFR: 59.18 mL/min — ABNORMAL LOW (ref >60.00)
Glucose, Bld: 120 mg/dL — ABNORMAL HIGH (ref 70–99)
Potassium: 4.3 mEq/L (ref 3.5–5.1)
Sodium: 139 mEq/L (ref 135–145)

## 2014-07-17 LAB — HEMOGLOBIN A1C: Hgb A1c MFr Bld: 6.1 % (ref 4.6–6.5)

## 2014-07-17 LAB — HEPATIC FUNCTION PANEL
ALT: 16 U/L (ref 0–53)
AST: 20 U/L (ref 0–37)
Albumin: 4.3 g/dL (ref 3.5–5.2)
Alkaline Phosphatase: 93 U/L (ref 39–117)
Bilirubin, Direct: 0.1 mg/dL (ref 0.0–0.3)
Total Bilirubin: 0.5 mg/dL (ref 0.2–1.2)
Total Protein: 7.7 g/dL (ref 6.0–8.3)

## 2014-07-17 MED ORDER — POLYETHYLENE GLYCOL 3350 17 GM/SCOOP PO POWD: ORAL | Status: AC

## 2014-07-17 MED ORDER — TRAZODONE HCL 50 MG PO TABS: 25.0000 mg | ORAL_TABLET | Freq: Every evening | ORAL | Status: DC | PRN

## 2014-07-17 MED ORDER — GABAPENTIN 100 MG PO CAPS: 200.0000 mg | ORAL_CAPSULE | Freq: Two times a day (BID) | ORAL | Status: DC

## 2014-07-17 MED ORDER — ATORVASTATIN CALCIUM 20 MG PO TABS: 20.0000 mg | ORAL_TABLET | Freq: Every day | ORAL | Status: DC

## 2014-07-17 NOTE — Assessment & Plan Note (Signed)
Chronic problem.  Attempting to control w/ healthy diet and regular exercise.  On ACE for renal protection.  Due for eye exam, pt plans to schedule.  Check labs.  Start meds prn.  Encouraged healthy diet and regular exercise.  Will follow closely.

## 2014-07-17 NOTE — Assessment & Plan Note (Signed)
>>  ASSESSMENT AND PLAN FOR DIABETES MELLITUS WITH RENAL MANIFESTATION ([facility]) WRITTEN ON 07/17/2014  9:14 AM BY Sheliah Hatch, MD  Chronic problem.  Attempting to control w/ healthy diet and regular exercise.  On ACE for renal protection.  Due for eye exam, pt plans to schedule.  Check labs.  Start meds prn.  Encouraged healthy diet and regular exercise.  Will follow closely.

## 2014-07-17 NOTE — Progress Notes (Signed)
Pre visit review using our clinic review tool, if applicable. No additional management support is needed unless otherwise documented below in the visit note. 

## 2014-07-17 NOTE — Assessment & Plan Note (Signed)
Chronic problem.  Well controlled.  Asymptomatic.  Check labs.  No anticipated med changes. 

## 2014-07-17 NOTE — Progress Notes (Signed)
   Subjective:    Patient ID: [name], male    DOB: [birth year], 59 y.o.   [name]  HPI DM- chronic problem, attempting to control w/ healthy diet and regular exercise.  Not currently on meds.  On ACE for renal protection.  Overdue for eye exam- pt plans to call and schedule appt.  Chronic neuropathy  HTN- chronic problem, on Benazepril, Amlodipine w/ good BP control.  Denies CP, SOB, HAs, visual changes, edema.  Hyperlipidemia- chronic problem, on Lipitor.  Denies abd pain, N/V, myalgias.   Review of Systems For ROS see HPI     Objective:   Physical Exam  Constitutional: He is oriented to person, place, and time. He appears well-developed and well-nourished. No distress.  HENT:  Head: Normocephalic and atraumatic.  Eyes: Conjunctivae and EOM are normal. Pupils are equal, round, and reactive to light.  Neck: Normal range of motion. Neck supple. No thyromegaly present.  Cardiovascular: Normal rate, regular rhythm, normal heart sounds and intact distal pulses.   No murmur heard. Pulmonary/Chest: Effort normal and breath sounds normal. No respiratory distress.  Abdominal: Soft. Bowel sounds are normal. He exhibits no distension.  Musculoskeletal: He exhibits no edema.  Lymphadenopathy:    He has no cervical adenopathy.  Neurological: He is alert and oriented to person, place, and time. No cranial nerve deficit.  Skin: Skin is warm and dry.  Psychiatric: He has a normal mood and affect. His behavior is normal.  Vitals reviewed.         Assessment & Plan:

## 2014-07-17 NOTE — Patient Instructions (Signed)
Schedule your complete physical for after 11/2 We'll notify you of your lab results and make any changes if needed Keep up the good work!  You look great! Call and schedule your eye exam! Call with any questions or concerns Have a wonderful time in [place]!!!

## 2014-07-17 NOTE — Assessment & Plan Note (Signed)
Chronic problem.  Tolerating statin w/o difficulty.  Check labs.  Adjust meds prn  

## 2014-09-17 ENCOUNTER — Other Ambulatory Visit: Payer: Self-pay | Admitting: Family Medicine

## 2014-09-17 NOTE — Telephone Encounter (Signed)
Medication filled to pharmacy as requested.   

## 2014-10-29 ENCOUNTER — Other Ambulatory Visit: Payer: Self-pay | Admitting: Family Medicine

## 2014-10-29 NOTE — Telephone Encounter (Signed)
Medication filled to pharmacy as requested.   

## 2014-10-31 ENCOUNTER — Ambulatory Visit (INDEPENDENT_AMBULATORY_CARE_PROVIDER_SITE_OTHER): Payer: Medicare HMO | Admitting: Family Medicine

## 2014-10-31 ENCOUNTER — Encounter: Payer: Self-pay | Admitting: Family Medicine

## 2014-10-31 VITALS — BP 124/82 | HR 67 | Temp 97.9°F | Resp 17 | Ht 70.0 in | Wt 234.2 lb

## 2014-10-31 DIAGNOSIS — J0100 Acute maxillary sinusitis, unspecified: Secondary | ICD-10-CM | POA: Diagnosis not present

## 2014-10-31 MED ORDER — PROMETHAZINE-DM 6.25-15 MG/5ML PO SYRP: 5.0000 mL | ORAL_SOLUTION | Freq: Four times a day (QID) | ORAL | Status: DC | PRN

## 2014-10-31 MED ORDER — AMOXICILLIN 875 MG PO TABS: 875.0000 mg | ORAL_TABLET | Freq: Two times a day (BID) | ORAL | Status: DC

## 2014-10-31 NOTE — Assessment & Plan Note (Signed)
Pt's sxs and PE consistent w/ infxn and given underlying diabetes, low threshold for abx.  Start Amox.  Cough meds prn.  Reviewed supportive care and red flags that should prompt return.  Pt expressed understanding and is in agreement w/ plan.

## 2014-10-31 NOTE — Patient Instructions (Signed)
Follow up as scheduled Start the Amoxicillin twice daily- take w/ food- for the sinus infection Take the cough syrup as needed- will cause drowsiness Mucinex Dm for daytime cough Drink plenty of fluids REST! Call with any questions or concerns Hang in there!!!

## 2014-10-31 NOTE — Progress Notes (Signed)
   Subjective:    Patient ID: [name], male    DOB: August 06, 1955, 59 y.o.   [name]  HPI Cough- sxs started 3 days ago.  Cough is intermittently productive.  No fever.  + frontal sinus pressure.  Taking Mucinex and Nasacort w/ some relief.  + sick contacts.  + SOB.  No wheezing.  + nasal congestion.   Review of Systems For ROS see HPI     Objective:   Physical Exam  Constitutional: He appears well-developed and well-nourished. No distress.  HENT:  Head: Normocephalic and atraumatic.  Right Ear: Tympanic membrane normal.  Left Ear: Tympanic membrane normal.  Nose: Mucosal edema and rhinorrhea present. Right sinus exhibits maxillary sinus tenderness and frontal sinus tenderness. Left sinus exhibits maxillary sinus tenderness and frontal sinus tenderness.  Mouth/Throat: Mucous membranes are normal. Oropharyngeal exudate and posterior oropharyngeal erythema present. No posterior oropharyngeal edema.  + PND  Eyes: Conjunctivae and EOM are normal. Pupils are equal, round, and reactive to light.  Neck: Normal range of motion. Neck supple.  Cardiovascular: Normal rate, regular rhythm and normal heart sounds.   Pulmonary/Chest: Effort normal and breath sounds normal. No respiratory distress. He has no wheezes.  + hacking cough  Lymphadenopathy:    He has no cervical adenopathy.  Skin: Skin is warm and dry.  Vitals reviewed.         Assessment & Plan:

## 2014-11-16 ENCOUNTER — Other Ambulatory Visit: Payer: Self-pay | Admitting: Family Medicine

## 2014-11-16 LAB — HM DIABETES EYE EXAM

## 2014-11-16 NOTE — Telephone Encounter (Signed)
Medication filled to pharmacy as requested.   

## 2014-11-23 ENCOUNTER — Ambulatory Visit (INDEPENDENT_AMBULATORY_CARE_PROVIDER_SITE_OTHER): Payer: Medicare HMO

## 2014-11-23 VITALS — BP 124/78 | HR 67 | Ht 70.5 in | Wt 237.6 lb

## 2014-11-23 DIAGNOSIS — Z0000 Encounter for general adult medical examination without abnormal findings: Secondary | ICD-10-CM | POA: Diagnosis not present

## 2014-11-23 DIAGNOSIS — Z23 Encounter for immunization: Secondary | ICD-10-CM

## 2014-11-23 NOTE — Progress Notes (Signed)
Pre visit review using our clinic review tool, if applicable. No additional management support is needed unless otherwise documented below in the visit note. 

## 2014-11-23 NOTE — Progress Notes (Signed)
Subjective:   [name] is a 59 y.o. male who presents for an Initial Medicare Annual Wellness Visit.  Review of Systems  Cardiac Risk Factors include: advanced age (>30men, >37 women);hypertension;male gender;diabetes mellitus Sleep patterns:  Sleeps at least at 9-10 hours per night/wakes up twice to void at night/still wakes up feeling tired/has obstructive sleep apnea--wears CPAP  Home Safety/Smoke Alarms:  Feels safe at home.  Lives with wife, 2 daughters, 4 grandchildren in 1 level home.  Smoke alarms present.   Firearm Safety:  No firearms.   Seat Belt Safety/Bike Helmet:  Always wears seat belt.  Sun exposure:  Limited sun exposure.    Counseling:   Eye Exam- 11/12/14 Dental- Goes every 6 months.   Male:  CCS-01/05/05-normal; repeat in 10 years   PSA-05/04/14-2.60     Objective:    Today's Vitals   11/23/14 0948 11/23/14 1024  BP: 142/80 124/78  Pulse: 67   Height: 5' 10.5" (1.791 m)   Weight: 237 lb 9.6 oz (107.775 kg)   SpO2: 98%   PainSc: 0-No pain     Current Medications (verified) Outpatient Encounter Prescriptions as of 11/23/2014  Medication Sig  . allopurinol (ZYLOPRIM) 300 MG tablet TAKE 1/2 TABLET BY MOUTH DAILY.  Marland Kitchen amLODipine (NORVASC) 10 MG tablet Take 0.5 tablets (5 mg total) by mouth 3 (three) times a week.  Marland Kitchen aspirin 81 MG tablet Take 81 mg by mouth daily.    Marland Kitchen atorvastatin (LIPITOR) 20 MG tablet Take 1 tablet (20 mg total) by mouth daily.  . benazepril (LOTENSIN) 20 MG tablet TAKE 1 TABLET BY MOUTH DAILY  . FLUoxetine (PROZAC) 40 MG capsule TAKE 1 CAPSULE BY MOUTH DAILY.  Marland Kitchen gabapentin (NEURONTIN) 100 MG capsule Take 2 capsules (200 mg total) by mouth 2 (two) times daily.  Marland Kitchen ketoconazole (NIZORAL) 2 % cream Apply 1 application topically daily.  . Multiple Vitamins-Iron (MULTIVITAMINS WITH IRON) TABS Take 1 tablet by mouth daily.  . pantoprazole (PROTONIX) 40 MG tablet TAKE 1 TABLET BY MOUTH ONCE DAILY. REPLACES [place].  Marland Kitchen polyethylene glycol powder  (GLYCOLAX/MIRALAX) powder USE AS DIRECTED  . traZODone (DESYREL) 50 MG tablet TAKE 1/2-1 TABLET BY MOUTH AT BEDTIME AS NEEDED FOR SLEEP.  . [DISCONTINUED] amoxicillin (AMOXIL) 875 MG tablet Take 1 tablet (875 mg total) by mouth 2 (two) times daily. (Patient not taking: Reported on 11/23/2014)  . [DISCONTINUED] promethazine-dextromethorphan (PROMETHAZINE-DM) 6.25-15 MG/5ML syrup Take 5 mLs by mouth 4 (four) times daily as needed. (Patient not taking: Reported on 11/23/2014)   No facility-administered encounter medications on file as of 11/23/2014.    Allergies (verified) Prednisone   History: Past Medical History  Diagnosis Date  . Hyperlipidemia   . Hypertension   . Diabetes mellitus   . GERD (gastroesophageal reflux disease)   . Gout   . Hepatic steatosis   . Obstructive sleep apnea    Past Surgical History  Procedure Laterality Date  . 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 Cervical fusion      Dr Sherwood Gambler  . Lumbar fusion       Dr Sherwood Gambler  . Esophageal dilation  2007  . Colonoscopy  2007    negative; [facility]   Family History  Problem Relation Age of Onset  . Heart disease Father     pacer  . Urolithiasis Father   . Heart attack Father   . Aneurysm Mother 60    CNS aneurysm  . Sudden  death Mother   . Diabetes Sister   . Cancer Maternal Uncle     ? primary  . Cancer Paternal Uncle     ? primary  . Hyperlipidemia Neg Hx   . Hypertension Neg Hx    Social History   Occupational History  . Not on file.   Social History Main Topics  . Smoking status: Former Smoker    Quit date: 01/06/1976  . Smokeless tobacco: Never Used     Comment: smoked 35 years ago as of 2013   . Alcohol Use: No  . Drug Use: No  . Sexual Activity: Not on file   Tobacco Counseling Counseling given: Not Answered   Activities of Daily Living In your present state of health, do you have any difficulty performing the following activities:  11/23/2014 07/17/2014  Hearing? N N  Vision? N N  Difficulty concentrating or making decisions? Y N  Walking or climbing stairs? Y N  Dressing or bathing? N N  Doing errands, shopping? N N  Preparing Food and eating ? N -  Using the Toilet? N -  In the past six months, have you accidently leaked urine? N -  Do you have problems with loss of bowel control? N -  Managing your Medications? N -  Managing your Finances? N -  Housekeeping or managing your Housekeeping? N -    Immunizations and Health Maintenance Immunization History  Administered Date(s) Administered  . Influenza Split 12/10/2010  . Influenza,inj,Quad PF,36+ Mos 10/19/2012  . Td 08/02/2008   Health Maintenance Due  Topic Date Due  . PNEUMOCOCCAL POLYSACCHARIDE VACCINE (1) 11/20/1956  . OPHTHALMOLOGY EXAM  05/09/2014  . INFLUENZA VACCINE  08/06/2014  . ZOSTAVAX  11/21/2014    Patient Care Team: Midge Minium, MD as PCP - General (Family Medicine) Rutherford Guys, MD as Consulting Physician (Ophthalmology) Jovita Gamma, MD as Consulting Physician (Neurosurgery)  Indicate any recent Medical Services you may have received from other than [facility] providers in the past year (date may be approximate).    Assessment:   This is a routine wellness examination for [name].   Physical appt scheduled with Dr. Birdie Riddle on 12/07/14.    Hearing/Vision screen  Hearing Screening   125Hz  250Hz  500Hz  1000Hz  2000Hz  4000Hz  8000Hz   Right ear:   Fail Fail Pass Fail   Left ear:   Pass Pass Pass Pass   Comments: No hearing difficulties.   Vision Screening Comments: Last eye exam: 11/12/14- Dr. Gershon Crane.   No vision changes Wear corrective glasses  Dietary issues and exercise activities discussed: Current Exercise Habits:: Home exercise routine, Type of exercise: strength training/weights;walking;treadmill, Time (Minutes): 60, Frequency (Times/Week): 4, Weekly Exercise (Minutes/Week): 240   Diet:  Nibbles throughout the day.   Eats 2 meals per day.  Doesn't always eat breakfast.  Drinks plenty of water.    Goals    . Lose 10 lbs by next year.      . Maintain current state of health.        Depression Screen PHQ 2/9 Scores 11/23/2014 03/29/2013  PHQ - 2 Score 0 6  PHQ- 9 Score - 11    Fall Risk Fall Risk  11/23/2014  Falls in the past year? No  Risk for fall due to : Other (Comment)  Risk for fall due to (comments): Back pain and Left leg occasionally goes numb    Cognitive Function: Alert and oriented x 4.  Completed AD8.  Score: 0/8.    Screening Tests  Health Maintenance  Topic Date Due  . PNEUMOCOCCAL POLYSACCHARIDE VACCINE (1) 11/20/1956  . OPHTHALMOLOGY EXAM  05/09/2014  . INFLUENZA VACCINE  08/06/2014  . ZOSTAVAX  11/21/2014  . Hepatitis C Screening  10/06/2015 (Originally 01/06/1955)  . HIV Screening  10/06/2015 (Originally 11/20/1969)  . COLONOSCOPY  01/06/2015  . HEMOGLOBIN A1C  01/17/2015  . FOOT EXAM  03/07/2015  . TETANUS/TDAP  08/03/2018        Plan:  Eat heart healthy diet (full of fruits, vegetables, whole grains, lean protein, water--limit salt, fat, and sugar intake) and continue to stay physically active as tolerated.    Follow up with Dr. Birdie Riddle for physical exam as scheduled.     During the course of the visit Mukund was educated and counseled about the following appropriate screening and preventive services:   Vaccines to include Pneumoccal, Influenza, Hepatitis B, Td, Zostavax, HCV  Electrocardiogram  Colorectal cancer screening  Cardiovascular disease screening  Diabetes screening  Glaucoma screening  Nutrition counseling  Prostate cancer screening  Smoking cessation counseling  Patient Instructions (the written plan) were given to the patient.   Rudene Anda, RN   11/23/2014

## 2014-11-23 NOTE — Progress Notes (Signed)
   Subjective:    Patient ID: [name], male    DOB: 1955/06/28, 59 y.o.   MRN: [id number]  HPI Agree w/ documentation as provided by RN   Review of Systems To be done at time of CPE    Objective:   Physical Exam To be done at [place]:

## 2014-11-23 NOTE — Patient Instructions (Addendum)
Eat heart healthy diet (full of fruits, vegetables, whole grains, lean protein, water--limit salt, fat, and sugar intake) and continue to stay physically active as tolerated.    Follow up with Dr. Birdie Riddle for physical exam as scheduled.     Health Maintenance, Male A healthy lifestyle and preventative care can promote health and wellness.  Maintain regular health, dental, and eye exams.  Eat a healthy diet. Foods like vegetables, fruits, whole grains, low-fat dairy products, and lean protein foods contain the nutrients you need and are low in calories. Decrease your intake of foods high in solid fats, added sugars, and salt. Get information about a proper diet from your health care provider, if necessary.  Regular physical exercise is one of the most important things you can do for your health. Most adults should get at least 150 minutes of moderate-intensity exercise (any activity that increases your heart rate and causes you to sweat) each week. In addition, most adults need muscle-strengthening exercises on 2 or more days a week.   Maintain a healthy weight. The body mass index (BMI) is a screening tool to identify possible weight problems. It provides an estimate of body fat based on height and weight. Your health care provider can find your BMI and can help you achieve or maintain a healthy weight. For males 20 years and older:  A BMI below 18.5 is considered underweight.  A BMI of 18.5 to 24.9 is normal.  A BMI of 25 to 29.9 is considered overweight.  A BMI of 30 and above is considered obese.  Maintain normal blood lipids and cholesterol by exercising and minimizing your intake of saturated fat. Eat a balanced diet with plenty of fruits and vegetables. Blood tests for lipids and cholesterol should begin at age 50 and be repeated every 5 years. If your lipid or cholesterol levels are high, you are over age 43, or you are at high risk for heart disease, you may need your cholesterol  levels checked more frequently.Ongoing high lipid and cholesterol levels should be treated with medicines if diet and exercise are not working.  If you smoke, find out from your health care provider how to quit. If you do not use tobacco, do not start.  Lung cancer screening is recommended for adults aged 17-80 years who are at high risk for developing lung cancer because of a history of smoking. A yearly low-dose CT scan of the lungs is recommended for people who have at least a 30-pack-year history of smoking and are current smokers or have quit within the past 15 years. A pack year of smoking is smoking an average of 1 pack of cigarettes a day for 1 year (for example, a 30-pack-year history of smoking could mean smoking 1 pack a day for 30 years or 2 packs a day for 15 years). Yearly screening should continue until the smoker has stopped smoking for at least 15 years. Yearly screening should be stopped for people who develop a health problem that would prevent them from having lung cancer treatment.  If you choose to drink alcohol, do not have more than 2 drinks per day. One drink is considered to be 12 oz (360 mL) of beer, 5 oz (150 mL) of wine, or 1.5 oz (45 mL) of liquor.  Avoid the use of street drugs. Do not share needles with anyone. Ask for help if you need support or instructions about stopping the use of drugs.  High blood pressure causes heart disease and increases  the risk of stroke. High blood pressure is more likely to develop in:  People who have blood pressure in the end of the normal range (100-139/85-89 mm Hg).  People who are overweight or obese.  People who are African American.  If you are 52-65 years of age, have your blood pressure checked every 3-5 years. If you are 10 years of age or older, have your blood pressure checked every year. You should have your blood pressure measured twice--once when you are at a hospital or clinic, and once when you are not at a hospital or  clinic. Record the average of the two measurements. To check your blood pressure when you are not at a hospital or clinic, you can use:  An automated blood pressure machine at a pharmacy.  A home blood pressure monitor.  If you are 22-52 years old, ask your health care provider if you should take aspirin to prevent heart disease.  Diabetes screening involves taking a blood sample to check your fasting blood sugar level. This should be done once every 3 years after age 43 if you are at a normal weight and without risk factors for diabetes. Testing should be considered at a younger age or be carried out more frequently if you are overweight and have at least 1 risk factor for diabetes.  Colorectal cancer can be detected and often prevented. Most routine colorectal cancer screening begins at the age of 58 and continues through age 61. However, your health care provider may recommend screening at an earlier age if you have risk factors for colon cancer. On a yearly basis, your health care provider may provide home test kits to check for hidden blood in the stool. A small camera at the end of a tube may be used to directly examine the colon (sigmoidoscopy or colonoscopy) to detect the earliest forms of colorectal cancer. Talk to your health care provider about this at age 39 when routine screening begins. A direct exam of the colon should be repeated every 5-10 years through age 60, unless early forms of precancerous polyps or small growths are found.  People who are at an increased risk for hepatitis B should be screened for this virus. You are considered at high risk for hepatitis B if:  You were born in a country where hepatitis B occurs often. Talk with your health care provider about which countries are considered high risk.  Your parents were born in a high-risk country and you have not received a shot to protect against hepatitis B (hepatitis B vaccine).  You have HIV or AIDS.  You use needles  to inject street drugs.  You live with, or have sex with, someone who has hepatitis B.  You are a man who has sex with other men (MSM).  You get hemodialysis treatment.  You take certain medicines for conditions like cancer, organ transplantation, and autoimmune conditions.  Hepatitis C blood testing is recommended for all people born from [birth year] through 1966 and any individual with known risk factors for hepatitis C.  Healthy men should no longer receive prostate-specific antigen (PSA) blood tests as part of routine cancer screening. Talk to your health care provider about prostate cancer screening.  Testicular cancer screening is not recommended for adolescents or adult males who have no symptoms. Screening includes self-exam, a health care provider exam, and other screening tests. Consult with your health care provider about any symptoms you have or any concerns you have about testicular cancer.  Practice  safe sex. Use condoms and avoid high-risk sexual practices to reduce the spread of sexually transmitted infections (STIs).  You should be screened for STIs, including gonorrhea and chlamydia if:  You are sexually active and are younger than 24 years.  You are older than 24 years, and your health care provider tells you that you are at risk for this type of infection.  Your sexual activity has changed since you were last screened, and you are at an increased risk for chlamydia or gonorrhea. Ask your health care provider if you are at risk.  If you are at risk of being infected with HIV, it is recommended that you take a prescription medicine daily to prevent HIV infection. This is called pre-exposure prophylaxis (PrEP). You are considered at risk if:  You are a man who has sex with other men (MSM).  You are a heterosexual man who is sexually active with multiple partners.  You take drugs by injection.  You are sexually active with a partner who has HIV.  Talk with your health  care provider about whether you are at high risk of being infected with HIV. If you choose to begin PrEP, you should first be tested for HIV. You should then be tested every 3 months for as long as you are taking PrEP.  Use sunscreen. Apply sunscreen liberally and repeatedly throughout the day. You should seek shade when your shadow is shorter than you. Protect yourself by wearing long sleeves, pants, a wide-brimmed hat, and sunglasses year round whenever you are outdoors.  Tell your health care provider of new moles or changes in moles, especially if there is a change in shape or color. Also, tell your health care provider if a mole is larger than the size of a pencil eraser.  A one-time screening for abdominal aortic aneurysm (AAA) and surgical repair of large AAAs by ultrasound is recommended for men aged 92-75 years who are current or former smokers.  Stay current with your vaccines (immunizations).   This information is not intended to replace advice given to you by your health care provider. Make sure you discuss any questions you have with your health care provider.   Document Released: 06/20/2007 Document Revised: 01/12/2014 Document Reviewed: 05/19/2010 Elsevier Interactive Patient Education [employer].

## 2014-12-06 ENCOUNTER — Telehealth: Payer: Self-pay

## 2014-12-06 NOTE — Telephone Encounter (Signed)
Pre visit call completed 

## 2014-12-06 NOTE — Telephone Encounter (Signed)
Pre Visit call completed. 

## 2014-12-06 NOTE — Telephone Encounter (Signed)
Pre-Visit Call Complete

## 2014-12-07 ENCOUNTER — Encounter: Payer: Medicare HMO | Admitting: Family Medicine

## 2014-12-07 ENCOUNTER — Encounter: Payer: Self-pay | Admitting: Family Medicine

## 2014-12-07 ENCOUNTER — Ambulatory Visit (INDEPENDENT_AMBULATORY_CARE_PROVIDER_SITE_OTHER): Payer: Medicare HMO | Admitting: Family Medicine

## 2014-12-07 VITALS — BP 122/76 | HR 67 | Temp 97.9°F | Resp 16 | Ht 71.0 in | Wt 234.4 lb

## 2014-12-07 DIAGNOSIS — E1169 Type 2 diabetes mellitus with other specified complication: Secondary | ICD-10-CM | POA: Diagnosis not present

## 2014-12-07 DIAGNOSIS — E1121 Type 2 diabetes mellitus with diabetic nephropathy: Secondary | ICD-10-CM | POA: Diagnosis not present

## 2014-12-07 DIAGNOSIS — E785 Hyperlipidemia, unspecified: Secondary | ICD-10-CM | POA: Diagnosis not present

## 2014-12-07 DIAGNOSIS — Z23 Encounter for immunization: Secondary | ICD-10-CM

## 2014-12-07 DIAGNOSIS — I10 Essential (primary) hypertension: Secondary | ICD-10-CM | POA: Diagnosis not present

## 2014-12-07 DIAGNOSIS — Z0000 Encounter for general adult medical examination without abnormal findings: Secondary | ICD-10-CM | POA: Diagnosis not present

## 2014-12-07 LAB — BASIC METABOLIC PANEL
BUN: 21 mg/dL (ref 6–23)
CO2: 27 mEq/L (ref 19–32)
Calcium: 9.9 mg/dL (ref 8.4–10.5)
Chloride: 104 mEq/L (ref 96–112)
Creatinine, Ser: 1.72 mg/dL — ABNORMAL HIGH (ref 0.40–1.50)
GFR: 52.42 mL/min — ABNORMAL LOW (ref >60.00)
Glucose, Bld: 96 mg/dL (ref 70–99)
Potassium: 4.5 mEq/L (ref 3.5–5.1)
Sodium: 138 mEq/L (ref 135–145)

## 2014-12-07 LAB — CBC WITH DIFFERENTIAL/PLATELET
Basophils Absolute: 0 10*3/uL (ref 0.0–0.1)
Basophils Relative: 0.4 % (ref 0.0–3.0)
Eosinophils Absolute: 0.1 10*3/uL (ref 0.0–0.7)
Eosinophils Relative: 1.6 % (ref 0.0–5.0)
HCT: 41.8 % (ref 39.0–52.0)
Hemoglobin: 13.6 g/dL (ref 13.0–17.0)
Lymphocytes Relative: 28.7 % (ref 12.0–46.0)
Lymphs Abs: 1.2 10*3/uL (ref 0.7–4.0)
MCHC: 32.4 g/dL (ref 30.0–36.0)
MCV: 82.7 fl (ref 78.0–100.0)
Monocytes Absolute: 0.3 10*3/uL (ref 0.1–1.0)
Monocytes Relative: 8.4 % (ref 3.0–12.0)
Neutro Abs: 2.5 10*3/uL (ref 1.4–7.7)
Neutrophils Relative %: 60.9 % (ref 43.0–77.0)
Platelets: 221 10*3/uL (ref 150.0–400.0)
RBC: 5.06 Mil/uL (ref 4.22–5.81)
RDW: 13.8 % (ref 11.5–15.5)
WBC: 4.2 10*3/uL (ref 4.0–10.5)

## 2014-12-07 LAB — HEMOGLOBIN A1C: Hgb A1c MFr Bld: 6.5 % (ref 4.6–6.5)

## 2014-12-07 LAB — HEPATIC FUNCTION PANEL
ALT: 20 U/L (ref 0–53)
AST: 29 U/L (ref 0–37)
Albumin: 4.4 g/dL (ref 3.5–5.2)
Alkaline Phosphatase: 96 U/L (ref 39–117)
Bilirubin, Direct: 0.2 mg/dL (ref 0.0–0.3)
Total Bilirubin: 0.8 mg/dL (ref 0.2–1.2)
Total Protein: 7.9 g/dL (ref 6.0–8.3)

## 2014-12-07 LAB — LIPID PANEL
Cholesterol: 151 mg/dL (ref 0–200)
HDL: 41.3 mg/dL (ref >39.00)
LDL Cholesterol: 97 mg/dL (ref 0–99)
NonHDL: 109.86
Total CHOL/HDL Ratio: 4
Triglycerides: 65 mg/dL (ref 0.0–149.0)
VLDL: 13 mg/dL (ref 0.0–40.0)

## 2014-12-07 LAB — TSH: TSH: 1.66 u[IU]/mL (ref 0.35–4.50)

## 2014-12-07 NOTE — Progress Notes (Signed)
   Subjective:    Patient ID: [name], male    DOB: 08/01/1955, 59 y.o.   MRN: [id number]  HPI CPE- pt had wellness visit completed w/ RN 3 weeks ago.  UTD on colonoscopy- due next year.  HTN- chronic problem, on Amlodipine, Benazepril.  Hyperlipidemia- chronic problem, on Lipitor.  DM- chronic problem, currently diet controlled.  UTD on foot exam, eye exam.  On ACE for renal protection.   Review of Systems Patient reports no vision/hearing changes, anorexia, fever ,adenopathy, persistant/recurrent hoarseness, swallowing issues, chest pain, palpitations, edema, persistant/recurrent cough, hemoptysis, dyspnea (rest,exertional, paroxysmal nocturnal), gastrointestinal  bleeding (melena, rectal bleeding), abdominal pain, excessive heart burn, GU symptoms (dysuria, hematuria, voiding/incontinence issues) syncope, focal weakness, memory loss, numbness & tingling, skin/hair/nail changes, depression, anxiety, abnormal bruising/bleeding, musculoskeletal symptoms/signs.     Objective:   Physical Exam General Appearance:    Alert, cooperative, no distress, appears stated age  Head:    Normocephalic, without obvious abnormality, atraumatic  Eyes:    PERRL, conjunctiva/corneas clear, EOM's intact, fundi    benign, both eyes       Ears:    Normal TM's and external ear canals, both ears  Nose:   Nares normal, septum midline, mucosa normal, no drainage   or sinus tenderness  Throat:   Lips, mucosa, and tongue normal; teeth and gums normal  Neck:   Supple, symmetrical, trachea midline, no adenopathy;       thyroid:  No enlargement/tenderness/nodules  Back:     Symmetric, no curvature, ROM normal, no CVA tenderness  Lungs:     Clear to auscultation bilaterally, respirations unlabored  Chest wall:    No tenderness or deformity  Heart:    Regular rate and rhythm, S1 and S2 normal, no murmur, rub   or gallop  Abdomen:     Soft, non-tender, bowel sounds active all four quadrants,    no masses, no  organomegaly  Genitalia:    Deferred to urology  Rectal:    Extremities:   Extremities normal, atraumatic, no cyanosis or edema  Pulses:   2+ and symmetric all extremities  Skin:   Skin color, texture, turgor normal, no rashes or lesions  Lymph nodes:   Cervical, supraclavicular, and axillary nodes normal  Neurologic:   CNII-XII intact. Normal strength, sensation and reflexes      throughout          Assessment & Plan:

## 2014-12-07 NOTE — Patient Instructions (Signed)
Follow up in 6 months to recheck BP, cholesterol, and diabetes We'll notify you of your lab results and make any changes if needed Keep up the good work on healthy diet and regular exercise- you look great!!! You are due for colonoscopy next year Call with any questions or concerns If you want to join [place] at the new [place] office, any scheduled appointments will automatically transfer and we will see you at [address] [place] [address] [place], [place] [address] (OPENING 01/08/15) Happy Holidays!!

## 2014-12-07 NOTE — Progress Notes (Signed)
Pre visit review using our clinic review tool, if applicable. No additional management support is needed unless otherwise documented below in the visit note. 

## 2014-12-09 NOTE — Assessment & Plan Note (Signed)
Chronic problem.  Tolerating statin w/o difficulty.  Applauded pt's efforts at healthy diet and regular exercise.  Check labs.  Adjust meds prn

## 2014-12-09 NOTE — Assessment & Plan Note (Signed)
Chronic problem.  Diet controlled.  UTD on eye exam, foot exam.  On ACE for renal protection.  Check labs.  Adjust tx plan prn.

## 2014-12-09 NOTE — Assessment & Plan Note (Signed)
Chronic problem.  Well controlled.  Asymptomatic.  Check labs.  No anticipated med changes. 

## 2014-12-09 NOTE — Assessment & Plan Note (Signed)
>>  ASSESSMENT AND PLAN FOR DIABETES MELLITUS WITH RENAL MANIFESTATION ([facility]) WRITTEN ON 12/09/2014  7:44 PM BY Sheliah Hatch, MD  Chronic problem.  Diet controlled.  UTD on eye exam, foot exam.  On ACE for renal protection.  Check labs.  Adjust tx plan prn.

## 2014-12-09 NOTE — Assessment & Plan Note (Signed)
Pt's PE WNL.  UTD on colonoscopy, urology.  Written screening schedule updated and given to pt.  Check labs.  Anticipatory guidance provided.

## 2014-12-11 ENCOUNTER — Other Ambulatory Visit: Payer: Self-pay | Admitting: Family Medicine

## 2014-12-11 NOTE — Telephone Encounter (Signed)
Called pt and LMOVM in regards to how medication is taken?

## 2014-12-12 ENCOUNTER — Encounter: Payer: Self-pay | Admitting: General Practice

## 2014-12-13 MED ORDER — AMLODIPINE BESYLATE 10 MG PO TABS: 5.0000 mg | ORAL_TABLET | ORAL | Status: DC

## 2014-12-13 NOTE — Telephone Encounter (Signed)
Medication filled to pharmacy as requested.   

## 2015-01-23 MED FILL — traZODone HCL 50 MG TABS: 50 | 30 days supply | Qty: 30 | Fill #2

## 2015-01-23 MED FILL — BENAZEPRIL HCL 20 MG TABLET: 20 | 90 days supply | Qty: 90 | Fill #0

## 2015-01-23 MED FILL — ATORVASTATIN 20 MG TABLET: 20 | 90 days supply | Qty: 90 | Fill #1

## 2015-01-23 MED FILL — GABAPENTIN 100 MG CAPSULE: 100 | 30 days supply | Qty: 120 | Fill #3

## 2015-01-28 ENCOUNTER — Other Ambulatory Visit: Payer: Self-pay | Admitting: Family Medicine

## 2015-01-28 MED FILL — FLUoxetine HCL 40 MG CAPS: 40 | 30 days supply | Qty: 30 | Fill #0

## 2015-01-28 NOTE — Telephone Encounter (Signed)
Medication filled to pharmacy as requested.   

## 2015-02-06 ENCOUNTER — Telehealth: Payer: Self-pay | Admitting: *Deleted

## 2015-02-06 MED ORDER — PANTOPRAZOLE SODIUM 40 MG PO TBEC: DELAYED_RELEASE_TABLET | ORAL | Status: DC

## 2015-02-06 MED ORDER — ALLOPURINOL 300 MG PO TABS: 150.0000 mg | ORAL_TABLET | Freq: Every day | ORAL | Status: DC

## 2015-02-06 MED ORDER — AMLODIPINE BESYLATE 10 MG PO TABS: 5.0000 mg | ORAL_TABLET | ORAL | Status: DC

## 2015-02-06 MED ORDER — TRAZODONE HCL 50 MG PO TABS: ORAL_TABLET | ORAL | Status: DC

## 2015-02-06 MED ORDER — GABAPENTIN 100 MG PO CAPS: 200.0000 mg | ORAL_CAPSULE | Freq: Two times a day (BID) | ORAL | Status: DC

## 2015-02-06 MED ORDER — FLUOXETINE HCL 40 MG PO CAPS: 40.0000 mg | ORAL_CAPSULE | Freq: Every day | ORAL | Status: DC

## 2015-02-06 MED ORDER — BENAZEPRIL HCL 20 MG PO TABS: 20.0000 mg | ORAL_TABLET | Freq: Every day | ORAL | Status: DC

## 2015-02-06 MED ORDER — KETOCONAZOLE 2 % EX CREA: 1.0000 "application " | TOPICAL_CREAM | Freq: Every day | CUTANEOUS | Status: DC

## 2015-02-06 MED ORDER — ATORVASTATIN CALCIUM 20 MG PO TABS: 20.0000 mg | ORAL_TABLET | Freq: Every day | ORAL | Status: DC

## 2015-02-06 NOTE — Telephone Encounter (Signed)
Pt called in to check the status of her husband's disability form    Name: [name]- Spouse   CB: [phone]

## 2015-02-06 NOTE — Telephone Encounter (Signed)
Received new patient package for [employer], per provider, we do not need these forms; we can just send new Rx scripts to new Mail Order pharmacy, [facility] for 90-day supply; spoke with pt's spouse [Darlene-dropped off forms] and explained this to her, she understood and agreed. Went through medication list and checked which prescriptions were needed and sent via escribe/SLS 02/01

## 2015-02-06 NOTE — Telephone Encounter (Signed)
Patient dropped off Handicap Placard Application, will pick up; forwarded to provider/SLS

## 2015-02-06 NOTE — Telephone Encounter (Signed)
Informed spouse [Darlene] while on a phone conversation regarding Rx refills, per provider, Ok to pick-up Handicap Placard Application this afternoon; she understood & agreed/SLS

## 2015-02-08 NOTE — Telephone Encounter (Signed)
Copy made, sent to scan/SLS 02/03

## 2015-02-12 ENCOUNTER — Telehealth: Payer: Self-pay | Admitting: Family Medicine

## 2015-02-12 NOTE — Telephone Encounter (Signed)
Pharmacy:  [facility] [place], [place] [phone] (Phone) [phone] (Fax)        Reason for call: amLODipine (NORVASC) 10 MG tablet [id number] needs to be rewritten for 18 tablets for an 84 day suppy so that insurance will cover it. And ketoconazole (NIZORAL) 2 % cream [id number] needs to be adjusted for a 90 day supply. They have 3 sizes which are 15g, 30g and 60g.   If you need additional info plse call pharmacist Larene Beach) @ [phone]

## 2015-02-13 MED ORDER — KETOCONAZOLE 2 % EX CREA: 1.0000 "application " | TOPICAL_CREAM | Freq: Every day | CUTANEOUS | Status: DC

## 2015-02-13 MED ORDER — AMLODIPINE BESYLATE 10 MG PO TABS: 5.0000 mg | ORAL_TABLET | ORAL | Status: DC

## 2015-02-13 NOTE — Telephone Encounter (Signed)
Medication filled to pharmacy as requested.   

## 2015-05-15 ENCOUNTER — Encounter: Payer: Self-pay | Admitting: Family Medicine

## 2015-05-15 ENCOUNTER — Ambulatory Visit (INDEPENDENT_AMBULATORY_CARE_PROVIDER_SITE_OTHER): Payer: PPO | Admitting: Family Medicine

## 2015-05-15 VITALS — BP 120/84 | HR 82 | Temp 98.0°F | Resp 16 | Ht 71.0 in | Wt 226.0 lb

## 2015-05-15 DIAGNOSIS — M5410 Radiculopathy, site unspecified: Secondary | ICD-10-CM | POA: Diagnosis not present

## 2015-05-15 DIAGNOSIS — N400 Enlarged prostate without lower urinary tract symptoms: Secondary | ICD-10-CM | POA: Diagnosis not present

## 2015-05-15 DIAGNOSIS — M65341 Trigger finger, right ring finger: Secondary | ICD-10-CM | POA: Diagnosis not present

## 2015-05-15 DIAGNOSIS — R140 Abdominal distension (gaseous): Secondary | ICD-10-CM | POA: Diagnosis not present

## 2015-05-15 DIAGNOSIS — M65352 Trigger finger, left little finger: Secondary | ICD-10-CM | POA: Diagnosis not present

## 2015-05-15 LAB — CBC WITH DIFFERENTIAL/PLATELET
Basophils Absolute: 0 10*3/uL (ref 0.0–0.1)
Basophils Relative: 0.3 % (ref 0.0–3.0)
Eosinophils Absolute: 0.1 10*3/uL (ref 0.0–0.7)
Eosinophils Relative: 1.3 % (ref 0.0–5.0)
HCT: 39.1 % (ref 39.0–52.0)
Hemoglobin: 12.9 g/dL — ABNORMAL LOW (ref 13.0–17.0)
Lymphocytes Relative: 18.9 % (ref 12.0–46.0)
Lymphs Abs: 0.8 10*3/uL (ref 0.7–4.0)
MCHC: 32.9 g/dL (ref 30.0–36.0)
MCV: 82 fl (ref 78.0–100.0)
Monocytes Absolute: 0.2 10*3/uL (ref 0.1–1.0)
Monocytes Relative: 5.8 % (ref 3.0–12.0)
Neutro Abs: 3.1 10*3/uL (ref 1.4–7.7)
Neutrophils Relative %: 73.7 % (ref 43.0–77.0)
Platelets: 209 10*3/uL (ref 150.0–400.0)
RBC: 4.77 Mil/uL (ref 4.22–5.81)
RDW: 14.3 % (ref 11.5–15.5)
WBC: 4.2 10*3/uL (ref 4.0–10.5)

## 2015-05-15 LAB — BASIC METABOLIC PANEL
BUN: 19 mg/dL (ref 6–23)
CO2: 25 mEq/L (ref 19–32)
Calcium: 9.5 mg/dL (ref 8.4–10.5)
Chloride: 105 mEq/L (ref 96–112)
Creatinine, Ser: 1.56 mg/dL — ABNORMAL HIGH (ref 0.40–1.50)
GFR: 58.58 mL/min — ABNORMAL LOW (ref >60.00)
Glucose, Bld: 109 mg/dL — ABNORMAL HIGH (ref 70–99)
Potassium: 4.6 mEq/L (ref 3.5–5.1)
Sodium: 138 mEq/L (ref 135–145)

## 2015-05-15 LAB — HEPATIC FUNCTION PANEL
ALT: 15 U/L (ref 0–53)
AST: 22 U/L (ref 0–37)
Albumin: 4.3 g/dL (ref 3.5–5.2)
Alkaline Phosphatase: 87 U/L (ref 39–117)
Bilirubin, Direct: 0.1 mg/dL (ref 0.0–0.3)
Total Bilirubin: 0.4 mg/dL (ref 0.2–1.2)
Total Protein: 7.1 g/dL (ref 6.0–8.3)

## 2015-05-15 LAB — LIPASE: Lipase: 26 U/L (ref 11.0–59.0)

## 2015-05-15 LAB — AMYLASE: Amylase: 73 U/L (ref 27–131)

## 2015-05-15 LAB — PSA, MEDICARE: PSA: 1.01 ng/ml (ref 0.10–4.00)

## 2015-05-15 MED ORDER — PREDNISONE 10 MG PO TABS: ORAL_TABLET | ORAL | Status: DC

## 2015-05-15 NOTE — Progress Notes (Signed)
Pre visit review using our clinic review tool, if applicable. No additional management support is needed unless otherwise documented below in the visit note. 

## 2015-05-15 NOTE — Progress Notes (Signed)
   Subjective:    Patient ID: [name], male    DOB: [birth year], 60 y.o.   MRN: [id number]  HPI abd bloating- pt reports increased abdominal pressure and bloating.  Increased gas.  sxs started ~2 weeks ago.  Pt has hx of constipation.  Pt reports ineffective BM.  Taking Miralax.  No burning w/ urination.  Having some bladder pressure when abdomen is full of gas.  No medication changes.  Pt has changed diet recently.  No N/V.  No diarrhea.  Radicular low back pain- pt has pain of low back but is more concerned about the radiating numbness and tingling into L leg.  He reports he is not able to put anything in front or back pocket or wear shoes comfortably.    Review of Systems For ROS see HPI     Objective:   Physical Exam  Constitutional: He is oriented to person, place, and time. He appears well-developed and well-nourished. No distress.  HENT:  Head: Normocephalic and atraumatic.  Eyes: Conjunctivae and EOM are normal. Pupils are equal, round, and reactive to light.  Pulmonary/Chest: Effort normal and breath sounds normal. No respiratory distress. He has no wheezes. He has no rales.  Abdominal: Soft. He exhibits distension. There is no tenderness. There is no rebound and no guarding.  Hyperactive BS  Musculoskeletal: He exhibits no edema.  Neurological: He is alert and oriented to person, place, and time.  + SLR on L  Skin: Skin is warm and dry.  Vitals reviewed.         Assessment & Plan:

## 2015-05-15 NOTE — Patient Instructions (Signed)
Follow up as scheduled We'll notify you of your lab results and make any changes if needed Do 2 tablespoons of Milk of Magnesia w/ 4 oz of warm prune juice.  Repeat in 2 hrs if no bowel movement and add Dulcolax the 2nd time Continue the miralax daily Add OTC Gas-X We'll call you with your Ortho appt for the back pain If no improvement of the gas and abdominal pain after the cleanout- let me know! Call with any questions or concerns Hang in there!

## 2015-05-16 NOTE — Assessment & Plan Note (Signed)
New.  Pt has hx of constipation and it sounds like despite his use of daily Miralax, he is still having difficulty.  Will start OTC Gas-X for the increased gas.  Pt to use MoM and prune juice cleanout.  Check labs to r/o other causes of abdominal pain but exam is benign today.  Reviewed supportive care and red flags that should prompt return.  Pt expressed understanding and is in agreement w/ plan.

## 2015-05-16 NOTE — Assessment & Plan Note (Signed)
Deteriorated.  Pt reports he is not able to put anything in his front or back pants pocket as this causes numbness in his legs and buttock.  Also has numbness of L foot and difficulty wearing shoes.  Pt is not interested in surgery.  Will refer to Dr Nelva Bush for complete evaluation and tx.  Pt expressed understanding and is in agreement w/ plan.

## 2015-05-16 NOTE — Assessment & Plan Note (Signed)
Chronic problem.  Due to bladder pressure will get PSA to assess for possible elevated PSA.  Will refer back to urology if high.  Pt expressed understanding and is in agreement w/ plan.

## 2015-05-27 MED FILL — traZODone HCL 50 MG TABS: 50 | 30 days supply | Qty: 30 | Fill #3

## 2015-05-27 MED FILL — FLUoxetine HCL 40 MG CAPS: 40 | 30 days supply | Qty: 30 | Fill #1

## 2015-06-07 ENCOUNTER — Ambulatory Visit: Payer: Medicare HMO | Admitting: Family Medicine

## 2015-06-28 ENCOUNTER — Other Ambulatory Visit: Payer: Self-pay | Admitting: Family Medicine

## 2015-06-28 MED FILL — FLUoxetine HCL 40 MG CAPS: 40 | 30 days supply | Qty: 30 | Fill #2

## 2015-06-28 MED FILL — traZODone HCL 50 MG TABS: 50 | 30 days supply | Qty: 30 | Fill #0

## 2015-06-28 NOTE — Telephone Encounter (Signed)
Medication filled to pharmacy as requested.   

## 2015-07-01 DIAGNOSIS — M65352 Trigger finger, left little finger: Secondary | ICD-10-CM | POA: Diagnosis not present

## 2015-07-01 DIAGNOSIS — M65341 Trigger finger, right ring finger: Secondary | ICD-10-CM | POA: Diagnosis not present

## 2015-07-03 DIAGNOSIS — M65341 Trigger finger, right ring finger: Secondary | ICD-10-CM | POA: Diagnosis not present

## 2015-07-03 DIAGNOSIS — G5623 Lesion of ulnar nerve, bilateral upper limbs: Secondary | ICD-10-CM | POA: Diagnosis not present

## 2015-07-03 DIAGNOSIS — M65352 Trigger finger, left little finger: Secondary | ICD-10-CM | POA: Diagnosis not present

## 2015-07-03 DIAGNOSIS — G5603 Carpal tunnel syndrome, bilateral upper limbs: Secondary | ICD-10-CM | POA: Diagnosis not present

## 2015-07-24 ENCOUNTER — Encounter: Payer: Self-pay | Admitting: Gastroenterology

## 2015-07-26 MED FILL — FLUoxetine HCL 40 MG CAPS: 40 | 30 days supply | Qty: 30 | Fill #3

## 2015-07-26 MED FILL — traZODone HCL 50 MG TABS: 50 | 30 days supply | Qty: 30 | Fill #1

## 2015-08-08 ENCOUNTER — Encounter: Payer: Self-pay | Admitting: Gastroenterology

## 2015-08-22 MED FILL — AMLODIPINE BESYLATE 10 MG T: 10 | 90 days supply | Qty: 18 | Fill #1

## 2015-08-22 MED FILL — traZODone HCL 50 MG TABS: 50 | 30 days supply | Qty: 30 | Fill #2

## 2015-08-22 MED FILL — FLUoxetine HCL 40 MG CAPS: 40 | 30 days supply | Qty: 30 | Fill #4

## 2015-09-03 ENCOUNTER — Other Ambulatory Visit: Payer: Self-pay | Admitting: Family Medicine

## 2015-09-03 MED FILL — ALLOPURINOL 300 MG TABLET: 300 | 60 days supply | Qty: 30 | Fill #2

## 2015-09-03 MED FILL — GABAPENTIN 100 MG CAPSULE: 100 | 30 days supply | Qty: 120 | Fill #0

## 2015-09-03 MED FILL — PANTOPRAZOLE SOD DR 40 MG T: 40 | 90 days supply | Qty: 90 | Fill #2

## 2015-09-10 DIAGNOSIS — G8918 Other acute postprocedural pain: Secondary | ICD-10-CM | POA: Diagnosis not present

## 2015-09-10 DIAGNOSIS — G5621 Lesion of ulnar nerve, right upper limb: Secondary | ICD-10-CM | POA: Diagnosis not present

## 2015-09-10 DIAGNOSIS — G5601 Carpal tunnel syndrome, right upper limb: Secondary | ICD-10-CM | POA: Diagnosis not present

## 2015-09-10 HISTORY — PX: CARPAL TUNNEL RELEASE: SHX101

## 2015-09-10 MED FILL — oxyCODONE HCL 5 MG TABS: 5 | 10 days supply | Qty: 50 | Fill #0

## 2015-09-10 MED FILL — METHOCARBAMOL 500 MG TABLET: 500 | 14 days supply | Qty: 40 | Fill #0

## 2015-09-24 DIAGNOSIS — G5621 Lesion of ulnar nerve, right upper limb: Secondary | ICD-10-CM | POA: Diagnosis not present

## 2015-09-24 MED FILL — FLUoxetine HCL 40 MG CAPS: 40 | 30 days supply | Qty: 30 | Fill #5

## 2015-09-27 ENCOUNTER — Ambulatory Visit (AMBULATORY_SURGERY_CENTER): Payer: Self-pay

## 2015-09-27 VITALS — Ht 71.5 in | Wt 235.0 lb

## 2015-09-27 DIAGNOSIS — Z1211 Encounter for screening for malignant neoplasm of colon: Secondary | ICD-10-CM

## 2015-09-27 MED ORDER — NA SULFATE-K SULFATE-MG SULF 17.5-3.13-1.6 GM/177ML PO SOLN: ORAL | 0 refills | Status: DC

## 2015-09-27 NOTE — Progress Notes (Signed)
Per pt, no allergies to soy or egg products.Pt not taking any weight loss meds or using  O2 at home. 

## 2015-10-03 ENCOUNTER — Encounter: Payer: Self-pay | Admitting: Gastroenterology

## 2015-10-08 DIAGNOSIS — G5601 Carpal tunnel syndrome, right upper limb: Secondary | ICD-10-CM | POA: Diagnosis not present

## 2015-10-10 MED FILL — GABAPENTIN 100 MG CAPSULE: 100 | 30 days supply | Qty: 120 | Fill #1

## 2015-10-10 MED FILL — traZODone HCL 50 MG TABS: 50 | 30 days supply | Qty: 30 | Fill #3

## 2015-10-10 MED FILL — SUPREP BOWEL PREP KIT: 17.5-3.13-1 | 1 days supply | Qty: 354 | Fill #0

## 2015-10-11 ENCOUNTER — Encounter: Payer: Self-pay | Admitting: Gastroenterology

## 2015-10-11 ENCOUNTER — Ambulatory Visit (AMBULATORY_SURGERY_CENTER): Payer: PPO | Admitting: Gastroenterology

## 2015-10-11 VITALS — BP 118/72 | HR 55 | Temp 96.8°F | Resp 16 | Ht 71.0 in | Wt 235.0 lb

## 2015-10-11 DIAGNOSIS — G4730 Sleep apnea, unspecified: Secondary | ICD-10-CM | POA: Diagnosis not present

## 2015-10-11 DIAGNOSIS — Z1211 Encounter for screening for malignant neoplasm of colon: Secondary | ICD-10-CM | POA: Diagnosis not present

## 2015-10-11 DIAGNOSIS — D122 Benign neoplasm of ascending colon: Secondary | ICD-10-CM

## 2015-10-11 DIAGNOSIS — I10 Essential (primary) hypertension: Secondary | ICD-10-CM | POA: Diagnosis not present

## 2015-10-11 DIAGNOSIS — E669 Obesity, unspecified: Secondary | ICD-10-CM | POA: Diagnosis not present

## 2015-10-11 DIAGNOSIS — E119 Type 2 diabetes mellitus without complications: Secondary | ICD-10-CM | POA: Diagnosis not present

## 2015-10-11 LAB — GLUCOSE, CAPILLARY
Glucose-Capillary: 115 mg/dL — ABNORMAL HIGH (ref 65–99)
Glucose-Capillary: 104 mg/dL — ABNORMAL HIGH (ref 65–99)

## 2015-10-11 LAB — HM DIABETES EYE EXAM

## 2015-10-11 MED ORDER — SODIUM CHLORIDE 0.9 % IV SOLN: 500.0000 mL | INTRAVENOUS | Status: DC

## 2015-10-11 NOTE — Progress Notes (Signed)
Called to room to assist during endoscopic procedure.  Patient ID and intended procedure confirmed with present staff. Received instructions for my participation in the procedure from the performing physician.  

## 2015-10-11 NOTE — Patient Instructions (Signed)
YOU HAD AN ENDOSCOPIC PROCEDURE TODAY AT [facility]:   Refer to the procedure report that was given to you for any specific questions about what was found during the examination.  If the procedure report does not answer your questions, please call your gastroenterologist to clarify.  If you requested that your care partner not be given the details of your procedure findings, then the procedure report has been included in a sealed envelope for you to review at your convenience later.  YOU SHOULD EXPECT: Some feelings of bloating in the abdomen. Passage of more gas than usual.  Walking can help get rid of the air that was put into your GI tract during the procedure and reduce the bloating. If you had a lower endoscopy (such as a colonoscopy or flexible sigmoidoscopy) you may notice spotting of blood in your stool or on the toilet paper. If you underwent a bowel prep for your procedure, you may not have a normal bowel movement for a few days.  Please Note:  You might notice some irritation and congestion in your nose or some drainage.  This is from the oxygen used during your procedure.  There is no need for concern and it should clear up in a day or so.  SYMPTOMS TO REPORT IMMEDIATELY:   Following lower endoscopy (colonoscopy or flexible sigmoidoscopy):  Excessive amounts of blood in the stool  Significant tenderness or worsening of abdominal pains  Swelling of the abdomen that is new, acute  Fever of 100F or higher    For urgent or emergent issues, a gastroenterologist can be reached at any hour by calling [phone].   DIET:  We do recommend a small meal at first, but then you may proceed to your regular diet.  Drink plenty of fluids but you should avoid alcoholic beverages for 24 hours.  ACTIVITY:  You should plan to take it easy for the rest of today and you should NOT DRIVE or use heavy machinery until tomorrow (because of the sedation medicines used during the test).     FOLLOW UP: Our staff will call the number listed on your records the next business day following your procedure to check on you and address any questions or concerns that you may have regarding the information given to you following your procedure. If we do not reach you, we will leave a message.  However, if you are feeling well and you are not experiencing any problems, there is no need to return our call.  We will assume that you have returned to your regular daily activities without incident.  If any biopsies were taken you will be contacted by phone or by letter within the next 1-3 weeks.  Please call us at [phone] if you have not heard about the biopsies in 3 weeks.    SIGNATURES/CONFIDENTIALITY: You and/or your care partner have signed paperwork which will be entered into your electronic medical record.  These signatures attest to the fact that that the information above on your After Visit Summary has been reviewed and is understood.  Full responsibility of the confidentiality of this discharge information lies with you and/or your care-partner.   Information on polyps given to you today

## 2015-10-11 NOTE — Op Note (Signed)
[facility] Patient Name: [name] Procedure Date: 10/11/2015 8:06 AM MRN: [id number] Endoscopist: Milus Banister , MD Age: 60 Referring MD:  Date of Birth: 11-Jul-1955 Gender: Male Account #: [id number] Procedure:                Colonoscopy Indications:              Screening for colorectal malignant neoplasm;                            colonoscopy 2007, normal Medicines:                Monitored Anesthesia Care Procedure:                Pre-Anesthesia Assessment:                           - Prior to the procedure, a History and Physical                            was performed, and patient medications and                            allergies were reviewed. The patient's tolerance of                            previous anesthesia was also reviewed. The risks                            and benefits of the procedure and the sedation                            options and risks were discussed with the patient.                            All questions were answered, and informed consent                            was obtained. Prior Anticoagulants: The patient has                            taken no previous anticoagulant or antiplatelet                            agents. ASA Grade Assessment: II - A patient with                            mild systemic disease. After reviewing the risks                            and benefits, the patient was deemed in                            satisfactory condition to undergo the procedure.  After obtaining informed consent, the colonoscope                            was passed under direct vision. Throughout the                            procedure, the patient's blood pressure, pulse, and                            oxygen saturations were monitored continuously. The                            Model CF-HQ190L [id number]) scope was introduced                            through the anus and advanced to the the  cecum,                            identified by appendiceal orifice and ileocecal                            valve. The colonoscopy was performed without                            difficulty. The patient tolerated the procedure                            well. The quality of the bowel preparation was                            excellent. The ileocecal valve, appendiceal                            orifice, and rectum were photographed. Scope In: 8:14:28 AM Scope Out: 8:30:21 AM Scope Withdrawal Time: 0 hours 9 minutes 30 seconds  Total Procedure Duration: 0 hours 15 minutes 53 seconds  Findings:                 A 5 mm polyp was found in the ascending colon. The                            polyp was sessile. The polyp was removed with a                            cold snare. Resection and retrieval were complete.                           The exam was otherwise without abnormality on                            direct and retroflexion views. Complications:            No immediate complications. Estimated blood loss:  None. Estimated Blood Loss:     Estimated blood loss: none. Impression:               - One 5 mm polyp in the ascending colon, removed                            with a cold snare. Resected and retrieved.                           - The examination was otherwise normal on direct                            and retroflexion views. Recommendation:           - Patient has a contact number available for                            emergencies. The signs and symptoms of potential                            delayed complications were discussed with the                            patient. Return to normal activities tomorrow.                            Written discharge instructions were provided to the                            patient.                           - Resume previous diet.                           - Continue present medications.                            You will receive a letter within 2-3 weeks with the                            pathology results and my final recommendations.                           If the polyp(s) is proven to be 'pre-cancerous' on                            pathology, you will need repeat colonoscopy in 5                            years. If the polyp(s) is NOT 'precancerous' on                            pathology then you should repeat colon cancer  screening in 10 years with colonoscopy without need                            for colon cancer screening by any method prior to                            then (including stool testing). Milus Banister, MD 10/11/2015 8:33:14 AM This report has been signed electronically.

## 2015-10-11 NOTE — Progress Notes (Signed)
To recovery, report to [place], RN, VSS 

## 2015-10-12 DIAGNOSIS — H2513 Age-related nuclear cataract, bilateral: Secondary | ICD-10-CM | POA: Diagnosis not present

## 2015-10-12 DIAGNOSIS — H40033 Anatomical narrow angle, bilateral: Secondary | ICD-10-CM | POA: Diagnosis not present

## 2015-10-14 ENCOUNTER — Telehealth: Payer: Self-pay

## 2015-10-14 NOTE — Telephone Encounter (Signed)
  Follow up Call-  Call back number 10/11/2015  Post procedure Call Back phone  # [phone]  Permission to leave phone message Yes  Some recent data might be hidden     Patient questions:  Do you have a fever, pain , or abdominal swelling? No. Pain Score  0 *  Have you tolerated food without any problems? Yes.    Have you been able to return to your normal activities? Yes.    Do you have any questions about your discharge instructions: Diet   No. Medications  No. Follow up visit  No.  Do you have questions or concerns about your Care? No.  Actions: * If pain score is 4 or above: No action needed, pain <4.

## 2015-10-17 ENCOUNTER — Encounter: Payer: Self-pay | Admitting: Gastroenterology

## 2015-10-21 ENCOUNTER — Other Ambulatory Visit: Payer: Self-pay | Admitting: Emergency Medicine

## 2015-10-21 ENCOUNTER — Other Ambulatory Visit: Payer: Self-pay | Admitting: Family Medicine

## 2015-10-21 MED ORDER — ALLOPURINOL 300 MG PO TABS: 150.0000 mg | ORAL_TABLET | Freq: Every day | ORAL | 0 refills | Status: DC

## 2015-10-21 MED ORDER — ATORVASTATIN CALCIUM 20 MG PO TABS: 20.0000 mg | ORAL_TABLET | Freq: Every day | ORAL | 0 refills | Status: DC

## 2015-10-21 MED ORDER — FLUOXETINE HCL 40 MG PO CAPS: 40.0000 mg | ORAL_CAPSULE | Freq: Every day | ORAL | 0 refills | Status: DC

## 2015-10-21 MED ORDER — BENAZEPRIL HCL 20 MG PO TABS: 20.0000 mg | ORAL_TABLET | Freq: Every day | ORAL | 0 refills | Status: DC

## 2015-10-22 ENCOUNTER — Ambulatory Visit (INDEPENDENT_AMBULATORY_CARE_PROVIDER_SITE_OTHER): Payer: PPO

## 2015-10-22 DIAGNOSIS — Z23 Encounter for immunization: Secondary | ICD-10-CM

## 2015-10-22 DIAGNOSIS — G5621 Lesion of ulnar nerve, right upper limb: Secondary | ICD-10-CM | POA: Diagnosis not present

## 2015-10-22 DIAGNOSIS — G5601 Carpal tunnel syndrome, right upper limb: Secondary | ICD-10-CM | POA: Diagnosis not present

## 2015-10-22 MED FILL — ATORVASTATIN 20 MG TABLET: 20 | 90 days supply | Qty: 90 | Fill #0

## 2015-10-22 MED FILL — ALLOPURINOL 300 MG TABLET: 300 | 90 days supply | Qty: 45 | Fill #0 | Status: TO

## 2015-10-22 MED FILL — BENAZEPRIL HCL 20 MG TABLET: 20 | 90 days supply | Qty: 90 | Fill #0

## 2015-10-22 MED FILL — FLUoxetine HCL 40 MG CAPS: 40 | 90 days supply | Qty: 90 | Fill #0

## 2015-11-06 DIAGNOSIS — Z4789 Encounter for other orthopedic aftercare: Secondary | ICD-10-CM | POA: Diagnosis not present

## 2015-11-06 DIAGNOSIS — G5601 Carpal tunnel syndrome, right upper limb: Secondary | ICD-10-CM | POA: Diagnosis not present

## 2015-11-11 ENCOUNTER — Encounter: Payer: Self-pay | Admitting: Family Medicine

## 2015-11-11 ENCOUNTER — Ambulatory Visit (INDEPENDENT_AMBULATORY_CARE_PROVIDER_SITE_OTHER): Payer: PPO | Admitting: Family Medicine

## 2015-11-11 VITALS — BP 126/78 | HR 86 | Temp 98.1°F | Resp 16 | Ht 71.0 in | Wt 230.4 lb

## 2015-11-11 DIAGNOSIS — I10 Essential (primary) hypertension: Secondary | ICD-10-CM | POA: Diagnosis not present

## 2015-11-11 DIAGNOSIS — E1121 Type 2 diabetes mellitus with diabetic nephropathy: Secondary | ICD-10-CM | POA: Diagnosis not present

## 2015-11-11 DIAGNOSIS — E785 Hyperlipidemia, unspecified: Secondary | ICD-10-CM | POA: Diagnosis not present

## 2015-11-11 DIAGNOSIS — E1169 Type 2 diabetes mellitus with other specified complication: Secondary | ICD-10-CM

## 2015-11-11 LAB — CBC WITH DIFFERENTIAL/PLATELET
Basophils Absolute: 0 10*3/uL (ref 0.0–0.1)
Basophils Relative: 0.4 % (ref 0.0–3.0)
Eosinophils Absolute: 0.1 10*3/uL (ref 0.0–0.7)
Eosinophils Relative: 2.2 % (ref 0.0–5.0)
HCT: 40.4 % (ref 39.0–52.0)
Hemoglobin: 13.1 g/dL (ref 13.0–17.0)
Lymphocytes Relative: 24.7 % (ref 12.0–46.0)
Lymphs Abs: 1 10*3/uL (ref 0.7–4.0)
MCHC: 32.5 g/dL (ref 30.0–36.0)
MCV: 83.8 fl (ref 78.0–100.0)
Monocytes Absolute: 0.3 10*3/uL (ref 0.1–1.0)
Monocytes Relative: 8.2 % (ref 3.0–12.0)
Neutro Abs: 2.7 10*3/uL (ref 1.4–7.7)
Neutrophils Relative %: 64.5 % (ref 43.0–77.0)
Platelets: 197 10*3/uL (ref 150.0–400.0)
RBC: 4.82 Mil/uL (ref 4.22–5.81)
RDW: 14.5 % (ref 11.5–15.5)
WBC: 4.2 10*3/uL (ref 4.0–10.5)

## 2015-11-11 LAB — HEPATIC FUNCTION PANEL
ALT: 24 U/L (ref 0–53)
AST: 24 U/L (ref 0–37)
Albumin: 4.5 g/dL (ref 3.5–5.2)
Alkaline Phosphatase: 78 U/L (ref 39–117)
Bilirubin, Direct: 0.1 mg/dL (ref 0.0–0.3)
Total Bilirubin: 0.6 mg/dL (ref 0.2–1.2)
Total Protein: 7.3 g/dL (ref 6.0–8.3)

## 2015-11-11 LAB — LIPID PANEL
Cholesterol: 154 mg/dL (ref 0–200)
HDL: 50.8 mg/dL (ref >39.00)
LDL Cholesterol: 91 mg/dL (ref 0–99)
NonHDL: 102.98
Total CHOL/HDL Ratio: 3
Triglycerides: 59 mg/dL (ref 0.0–149.0)
VLDL: 11.8 mg/dL (ref 0.0–40.0)

## 2015-11-11 LAB — BASIC METABOLIC PANEL
BUN: 22 mg/dL (ref 6–23)
CO2: 32 mEq/L (ref 19–32)
Calcium: 10.2 mg/dL (ref 8.4–10.5)
Chloride: 104 mEq/L (ref 96–112)
Creatinine, Ser: 1.61 mg/dL — ABNORMAL HIGH (ref 0.40–1.50)
GFR: 56.39 mL/min — ABNORMAL LOW (ref >60.00)
Glucose, Bld: 125 mg/dL — ABNORMAL HIGH (ref 70–99)
Potassium: 5 mEq/L (ref 3.5–5.1)
Sodium: 141 mEq/L (ref 135–145)

## 2015-11-11 LAB — HEMOGLOBIN A1C: Hgb A1c MFr Bld: 6.4 % (ref 4.6–6.5)

## 2015-11-11 LAB — TSH: TSH: 1.1 u[IU]/mL (ref 0.35–4.50)

## 2015-11-11 NOTE — Assessment & Plan Note (Signed)
Chronic problem.  Tolerating statin w/o difficulty.  Check labs.  Adjust meds prn  

## 2015-11-11 NOTE — Patient Instructions (Signed)
Schedule your complete physical in 3-4 months We'll notify you of your lab results and make any changes if needed Keep up the good work on healthy diet and regular exercise- you look great! Call with any questions or concerns Happy Early Birthday!!! Happy Holidays!!!

## 2015-11-11 NOTE — Progress Notes (Signed)
Pre visit review using our clinic review tool, if applicable. No additional management support is needed unless otherwise documented below in the visit note. 

## 2015-11-11 NOTE — Progress Notes (Signed)
   Subjective:    Patient ID: [name], male    DOB: June 07, 1955, 60 y.o.   MRN: [id number]  HPI DM- chronic problem, currently diet controlled.  Exercising regularly.  Pt has lost 5 lbs since beginning of October.  UTD on eye exam.  On ACE for renal protection.  Due for foot exam.  No numbness/tingling of hands/feet.    HTN- chronic problem, on Benazepril, Amlodipine.  Well controlled.  No CP, SOB, HAs, visual changes, edema.  Hyperlipidemia- chronic problem, on Lipitor.  Due for repeat labs.  No abd pain, N/V.   Review of Systems For ROS see HPI     Objective:   Physical Exam  Constitutional: He is oriented to person, place, and time. He appears well-developed and well-nourished. No distress.  HENT:  Head: Normocephalic and atraumatic.  Eyes: Conjunctivae and EOM are normal. Pupils are equal, round, and reactive to light.  Neck: Normal range of motion. Neck supple. No thyromegaly present.  Cardiovascular: Normal rate, regular rhythm, normal heart sounds and intact distal pulses.   No murmur heard. Pulmonary/Chest: Effort normal and breath sounds normal. No respiratory distress.  Abdominal: Soft. Bowel sounds are normal. He exhibits no distension.  Musculoskeletal: He exhibits no edema.  Lymphadenopathy:    He has no cervical adenopathy.  Neurological: He is alert and oriented to person, place, and time. No cranial nerve deficit.  Skin: Skin is warm and dry.  Psychiatric: He has a normal mood and affect. His behavior is normal.  Vitals reviewed.         Assessment & Plan:

## 2015-11-11 NOTE — Assessment & Plan Note (Signed)
Chronic problem.  Currently diet controlled.  UTD on eye exam, foot exam done today.  Asymptomatic.  On ACE for renal protection.  Applauded his efforts at healthy diet and regular exercise- he has lost 5 lbs in a month!  Check labs.  Start meds prn.  Will continue to follow.

## 2015-11-11 NOTE — Assessment & Plan Note (Signed)
Chronic problem.  Well controlled today.  Asymptomatic.  Check labs.  No anticipated med changes.  Will follow. 

## 2015-11-11 NOTE — Assessment & Plan Note (Signed)
>>  ASSESSMENT AND PLAN FOR DIABETES MELLITUS WITH RENAL MANIFESTATION ([facility]) WRITTEN ON 11/11/2015 10:04 AM BY Sheliah Hatch, MD  Chronic problem.  Currently diet controlled.  UTD on eye exam, foot exam done today.  Asymptomatic.  On ACE for renal protection.  Applauded his efforts at healthy diet and regular exercise- he has lost 5 lbs in a month!  Check labs.  Start meds prn.  Will continue to follow.

## 2015-11-12 ENCOUNTER — Other Ambulatory Visit: Payer: Self-pay | Admitting: Family Medicine

## 2015-11-12 DIAGNOSIS — R7989 Other specified abnormal findings of blood chemistry: Secondary | ICD-10-CM

## 2015-11-12 NOTE — Progress Notes (Signed)
Pre visit review using our clinic review tool, if applicable. No additional management support is needed unless otherwise documented below in the visit note. 

## 2015-11-19 ENCOUNTER — Telehealth: Payer: Self-pay | Admitting: Family Medicine

## 2015-11-19 MED ORDER — AMLODIPINE BESYLATE 10 MG PO TABS: 5.0000 mg | ORAL_TABLET | ORAL | 6 refills | Status: DC

## 2015-11-19 NOTE — Telephone Encounter (Signed)
Requesting refill of amLODipine (NORVASC) 10 MG tablet #90.  Pharmacy has changed to [facility].

## 2015-11-19 NOTE — Telephone Encounter (Signed)
Medication filled to pharmacy as requested.   

## 2015-12-10 ENCOUNTER — Encounter: Payer: Self-pay | Admitting: Family Medicine

## 2015-12-11 MED ORDER — PANTOPRAZOLE SODIUM 40 MG PO TBEC: DELAYED_RELEASE_TABLET | ORAL | 1 refills | Status: DC

## 2015-12-23 ENCOUNTER — Ambulatory Visit (INDEPENDENT_AMBULATORY_CARE_PROVIDER_SITE_OTHER): Payer: PPO | Admitting: Physician Assistant

## 2015-12-23 ENCOUNTER — Encounter: Payer: Self-pay | Admitting: Physician Assistant

## 2015-12-23 VITALS — BP 122/84 | HR 60 | Temp 98.1°F | Resp 16 | Ht 71.0 in | Wt 238.0 lb

## 2015-12-23 DIAGNOSIS — H6982 Other specified disorders of Eustachian tube, left ear: Secondary | ICD-10-CM

## 2015-12-23 MED ORDER — FLUTICASONE PROPIONATE 50 MCG/ACT NA SUSP: 2.0000 | Freq: Every day | NASAL | 6 refills | Status: DC

## 2015-12-23 NOTE — Progress Notes (Signed)
Patient presents to clinic today c/o 2 weeks of L ear pressure and pain with some decreased hearing. Pain is aching in nature. Notes some popping of ear. Endorses some mild tinnitus of L ear. Does note some mild sinus pressure but denies sinus pain, facial pain, cough. Denies fever, chills, cough or SOB. Denies history of seasonal allergies. Notes + history of decreased hearing, diagnosed by Audiologist.   Past Medical History:  Diagnosis Date  . Carpal tunnel syndrome, bilateral   . Carpal tunnel syndrome, right    nerve impingement right arm/wears brace  . Diabetes mellitus    no meds  . GERD (gastroesophageal reflux disease)   . Gout   . Hepatic steatosis   . Hyperlipidemia   . Hypertension   . Obstructive sleep apnea     Current Outpatient Prescriptions on File Prior to Visit  Medication Sig Dispense Refill  . allopurinol (ZYLOPRIM) 300 MG tablet Take 0.5 tablets (150 mg total) by mouth daily. 90 tablet 0  . amLODipine (NORVASC) 10 MG tablet Take 0.5 tablets (5 mg total) by mouth 3 (three) times a week. 18 tablet 6  . Ascorbic Acid (VITAMIN C) 1000 MG tablet Take 1,000 mg by mouth daily.    Marland Kitchen aspirin 81 MG tablet Take 81 mg by mouth daily.      Marland Kitchen atorvastatin (LIPITOR) 20 MG tablet Take 1 tablet (20 mg total) by mouth daily. 90 tablet 0  . benazepril (LOTENSIN) 20 MG tablet Take 1 tablet (20 mg total) by mouth daily. 90 tablet 0  . FLUoxetine (PROZAC) 40 MG capsule Take 1 capsule (40 mg total) by mouth daily. 90 capsule 0  . gabapentin (NEURONTIN) 100 MG capsule Take 100 mg by mouth. Take 2 capsules by mouth twice daily    . Multiple Vitamins-Iron (MULTIVITAMINS WITH IRON) TABS Take 1 tablet by mouth daily. 30 tablet 0  . pantoprazole (PROTONIX) 40 MG tablet TAKE 1 TABLET BY MOUTH ONCE DAILY. REPLACES [place]. 90 tablet 1  . polyethylene glycol powder (GLYCOLAX/MIRALAX) powder USE AS DIRECTED 850 g 11  . pyridoxine (B-6) 100 MG tablet Take 100 mg by mouth daily.    . traZODone  (DESYREL) 50 MG tablet TAKE 1/2 TO 1 TABLET BY MOUTH AT BEDTIME AS NEEDED FOR SLEEP. 30 tablet 3   No current facility-administered medications on file prior to visit.     Allergies  Allergen Reactions  . Prednisone Other (See Comments)    Caused blood sugar to increase, pt will not take    Family History  Problem Relation Age of Onset  . Heart disease Father     pacer  . Urolithiasis Father   . Heart attack Father   . Aneurysm Mother 34    CNS aneurysm  . Sudden death Mother   . Diabetes Sister   . Cancer Maternal Uncle     ? primary  . Cancer Paternal Uncle     ? primary  . Kidney disease Brother   . Heart disease Sister   . Heart disease Sister   . Hyperlipidemia Neg Hx   . Hypertension Neg Hx   . Colon cancer Neg Hx     Social History   Social History  . Marital status: Married    Spouse name: N/A  . Number of children: N/A  . Years of education: N/A   Social History Main Topics  . Smoking status: Former Smoker    Quit date: 01/06/1976  . Smokeless tobacco: Never Used  Comment: smoked 35 years ago as of 2013   . Alcohol use No  . Drug use: No  . Sexual activity: Not Asked   Other Topics Concern  . None   Social History Narrative  . None   Review of Systems - See HPI.  All other ROS are negative.  BP 122/84   Pulse 60   Temp 98.1 F (36.7 C) (Oral)   Resp 16   Ht 5\' 11"  (1.803 m)   Wt 238 lb (108 kg)   SpO2 95%   BMI 33.19 kg/m   Physical Exam  Constitutional: He is oriented to person, place, and time and well-developed, well-nourished, and in no distress.  HENT:  Head: Normocephalic and atraumatic.  Right Ear: Tympanic membrane normal.  Left Ear: Tympanic membrane is retracted. Tympanic membrane is not erythematous and not bulging. A middle ear effusion is present.  Nose: Nose normal.  Mouth/Throat: Oropharynx is clear and moist. No oropharyngeal exudate.  Eyes: Conjunctivae are normal. Pupils are equal, round, and reactive to light.    Neck: Neck supple.  Cardiovascular: Normal rate, regular rhythm, normal heart sounds and intact distal pulses.   Pulmonary/Chest: Effort normal and breath sounds normal. No respiratory distress. He has no wheezes. He has no rales. He exhibits no tenderness.  Lymphadenopathy:    He has no cervical adenopathy.  Neurological: He is alert and oriented to person, place, and time.  Skin: Skin is warm and dry. No rash noted.  Psychiatric: Affect normal.  Vitals reviewed.  Recent Results (from the past 2160 hour(s))  HM DIABETES EYE EXAM     Status: None   Collection Time: 10/11/15 12:00 AM  Result Value Ref Range   HM Diabetic Eye Exam No Retinopathy No Retinopathy  Glucose, capillary     Status: Abnormal   Collection Time: 10/11/15  7:35 AM  Result Value Ref Range   Glucose-Capillary 115 (H) 65 - 99 mg/dL  Glucose, capillary     Status: Abnormal   Collection Time: 10/11/15  8:39 AM  Result Value Ref Range   Glucose-Capillary 104 (H) 65 - 99 mg/dL  Hemoglobin A1c     Status: None   Collection Time: 11/11/15 10:06 AM  Result Value Ref Range   Hgb A1c MFr Bld 6.4 4.6 - 6.5 %    Comment: Glycemic Control Guidelines for People with Diabetes:Non Diabetic:  <6%Goal of Therapy: <7%Additional Action Suggested:  >8%   Lipid panel     Status: None   Collection Time: 11/11/15 10:06 AM  Result Value Ref Range   Cholesterol 154 0 - 200 mg/dL    Comment: ATP III Classification       Desirable:  < 200 mg/dL               Borderline High:  200 - 239 mg/dL          High:  > = 240 mg/dL   Triglycerides 59.0 0.0 - 149.0 mg/dL    Comment: Normal:  <150 mg/dLBorderline High:  150 - 199 mg/dL   HDL 50.80 >39.00 mg/dL   VLDL 11.8 0.0 - 40.0 mg/dL   LDL Cholesterol 91 0 - 99 mg/dL   Total CHOL/HDL Ratio 3     Comment:                Men          Women1/2 Average Risk     3.4          3.3Average  Risk          5.0          4.42X Average Risk          9.6          7.13X Average Risk          15.0           11.0                       NonHDL 102.98     Comment: NOTE:  Non-HDL goal should be 30 mg/dL higher than patient's LDL goal (i.e. LDL goal of < 70 mg/dL, would have non-HDL goal of < 100 mg/dL)  Basic metabolic panel     Status: Abnormal   Collection Time: 11/11/15 10:06 AM  Result Value Ref Range   Sodium 141 135 - 145 mEq/L   Potassium 5.0 3.5 - 5.1 mEq/L   Chloride 104 96 - 112 mEq/L   CO2 32 19 - 32 mEq/L   Glucose, Bld 125 (H) 70 - 99 mg/dL   BUN 22 6 - 23 mg/dL   Creatinine, Ser 1.61 (H) 0.40 - 1.50 mg/dL   Calcium 10.2 8.4 - 10.5 mg/dL   GFR 56.39 (L) >60.00 mL/min  TSH     Status: None   Collection Time: 11/11/15 10:06 AM  Result Value Ref Range   TSH 1.10 0.35 - 4.50 uIU/mL  Hepatic function panel     Status: None   Collection Time: 11/11/15 10:06 AM  Result Value Ref Range   Total Bilirubin 0.6 0.2 - 1.2 mg/dL   Bilirubin, Direct 0.1 0.0 - 0.3 mg/dL   Alkaline Phosphatase 78 39 - 117 U/L   AST 24 0 - 37 U/L   ALT 24 0 - 53 U/L   Total Protein 7.3 6.0 - 8.3 g/dL   Albumin 4.5 3.5 - 5.2 g/dL  CBC with Differential/Platelet     Status: None   Collection Time: 11/11/15 10:06 AM  Result Value Ref Range   WBC 4.2 4.0 - 10.5 K/uL   RBC 4.82 4.22 - 5.81 Mil/uL   Hemoglobin 13.1 13.0 - 17.0 g/dL   HCT 40.4 39.0 - 52.0 %   MCV 83.8 78.0 - 100.0 fl   MCHC 32.5 30.0 - 36.0 g/dL   RDW 14.5 11.5 - 15.5 %   Platelets 197.0 150.0 - 400.0 K/uL   Neutrophils Relative % 64.5 43.0 - 77.0 %   Lymphocytes Relative 24.7 12.0 - 46.0 %   Monocytes Relative 8.2 3.0 - 12.0 %   Eosinophils Relative 2.2 0.0 - 5.0 %   Basophils Relative 0.4 0.0 - 3.0 %   Neutro Abs 2.7 1.4 - 7.7 K/uL   Lymphs Abs 1.0 0.7 - 4.0 K/uL   Monocytes Absolute 0.3 0.1 - 1.0 K/uL   Eosinophils Absolute 0.1 0.0 - 0.7 K/uL   Basophils Absolute 0.0 0.0 - 0.1 K/uL    Assessment/Plan: 1. Eustachian tube dysfunction, left Rx Flonase. Saline rinses as directed. Short trial of Claritin. Supportive measures reviewed.  FU if not resolving.    Leeanne Rio, PA-C

## 2015-12-23 NOTE — Progress Notes (Signed)
Pre visit review using our clinic review tool, if applicable. No additional management support is needed unless otherwise documented below in the visit note. 

## 2015-12-23 NOTE — Patient Instructions (Signed)
Please start the saline nasal rinses as directed. Try a daily Claritin over the next 3-4 days to see if this helps. Restart your Flonase daily (I have sent in a new prescription).  Follow-up if symptoms are not improving/resolving.

## 2016-01-02 DIAGNOSIS — G5603 Carpal tunnel syndrome, bilateral upper limbs: Secondary | ICD-10-CM | POA: Diagnosis not present

## 2016-01-02 DIAGNOSIS — Z4789 Encounter for other orthopedic aftercare: Secondary | ICD-10-CM | POA: Diagnosis not present

## 2016-01-02 DIAGNOSIS — M65352 Trigger finger, left little finger: Secondary | ICD-10-CM | POA: Diagnosis not present

## 2016-01-02 DIAGNOSIS — G5623 Lesion of ulnar nerve, bilateral upper limbs: Secondary | ICD-10-CM | POA: Diagnosis not present

## 2016-01-09 ENCOUNTER — Other Ambulatory Visit: Payer: Self-pay | Admitting: Family Medicine

## 2016-01-09 MED ORDER — FLUOXETINE HCL 40 MG PO CAPS: 40.0000 mg | ORAL_CAPSULE | Freq: Every day | ORAL | 0 refills | Status: DC

## 2016-01-21 ENCOUNTER — Other Ambulatory Visit: Payer: Self-pay | Admitting: Family Medicine

## 2016-01-24 MED ORDER — ATORVASTATIN CALCIUM 20 MG PO TABS: 20.0000 mg | ORAL_TABLET | Freq: Every day | ORAL | 0 refills | Status: DC

## 2016-01-24 MED ORDER — ALLOPURINOL 300 MG PO TABS: 150.0000 mg | ORAL_TABLET | Freq: Every day | ORAL | 0 refills | Status: DC

## 2016-01-24 MED ORDER — BENAZEPRIL HCL 20 MG PO TABS: 20.0000 mg | ORAL_TABLET | Freq: Every day | ORAL | 0 refills | Status: DC

## 2016-01-27 ENCOUNTER — Encounter: Payer: Self-pay | Admitting: Family Medicine

## 2016-01-27 ENCOUNTER — Other Ambulatory Visit: Payer: Self-pay | Admitting: Family Medicine

## 2016-01-27 ENCOUNTER — Other Ambulatory Visit: Payer: Self-pay | Admitting: General Practice

## 2016-01-27 MED ORDER — TRAZODONE HCL 50 MG PO TABS: 25.0000 mg | ORAL_TABLET | Freq: Every evening | ORAL | 3 refills | Status: DC | PRN

## 2016-01-28 DIAGNOSIS — M79662 Pain in left lower leg: Secondary | ICD-10-CM | POA: Diagnosis not present

## 2016-04-05 ENCOUNTER — Encounter: Payer: Self-pay | Admitting: Family Medicine

## 2016-04-07 MED ORDER — GABAPENTIN 100 MG PO CAPS: 200.0000 mg | ORAL_CAPSULE | Freq: Two times a day (BID) | ORAL | 6 refills | Status: DC

## 2016-04-08 ENCOUNTER — Other Ambulatory Visit: Payer: Self-pay | Admitting: Family Medicine

## 2016-04-17 ENCOUNTER — Encounter: Payer: Self-pay | Admitting: Family Medicine

## 2016-04-17 ENCOUNTER — Ambulatory Visit (INDEPENDENT_AMBULATORY_CARE_PROVIDER_SITE_OTHER): Payer: PPO | Admitting: Family Medicine

## 2016-04-17 VITALS — BP 124/78 | HR 64 | Temp 98.2°F | Resp 17 | Ht 71.0 in | Wt 242.5 lb

## 2016-04-17 DIAGNOSIS — Z23 Encounter for immunization: Secondary | ICD-10-CM | POA: Diagnosis not present

## 2016-04-17 DIAGNOSIS — E785 Hyperlipidemia, unspecified: Secondary | ICD-10-CM

## 2016-04-17 DIAGNOSIS — I10 Essential (primary) hypertension: Secondary | ICD-10-CM

## 2016-04-17 DIAGNOSIS — Z0000 Encounter for general adult medical examination without abnormal findings: Secondary | ICD-10-CM

## 2016-04-17 DIAGNOSIS — H9193 Unspecified hearing loss, bilateral: Secondary | ICD-10-CM

## 2016-04-17 DIAGNOSIS — E559 Vitamin D deficiency, unspecified: Secondary | ICD-10-CM

## 2016-04-17 DIAGNOSIS — E1169 Type 2 diabetes mellitus with other specified complication: Secondary | ICD-10-CM

## 2016-04-17 DIAGNOSIS — E1121 Type 2 diabetes mellitus with diabetic nephropathy: Secondary | ICD-10-CM

## 2016-04-17 LAB — HEPATIC FUNCTION PANEL
ALT: 23 U/L (ref 0–53)
AST: 26 U/L (ref 0–37)
Albumin: 4.4 g/dL (ref 3.5–5.2)
Alkaline Phosphatase: 87 U/L (ref 39–117)
Bilirubin, Direct: 0.1 mg/dL (ref 0.0–0.3)
Total Bilirubin: 0.6 mg/dL (ref 0.2–1.2)
Total Protein: 7.2 g/dL (ref 6.0–8.3)

## 2016-04-17 LAB — CBC WITH DIFFERENTIAL/PLATELET
Basophils Absolute: 0 10*3/uL (ref 0.0–0.1)
Basophils Relative: 0.8 % (ref 0.0–3.0)
Eosinophils Absolute: 0.1 10*3/uL (ref 0.0–0.7)
Eosinophils Relative: 1.8 % (ref 0.0–5.0)
HCT: 39.6 % (ref 39.0–52.0)
Hemoglobin: 12.7 g/dL — ABNORMAL LOW (ref 13.0–17.0)
Lymphocytes Relative: 23.3 % (ref 12.0–46.0)
Lymphs Abs: 1.1 10*3/uL (ref 0.7–4.0)
MCHC: 32.1 g/dL (ref 30.0–36.0)
MCV: 84.1 fl (ref 78.0–100.0)
Monocytes Absolute: 0.5 10*3/uL (ref 0.1–1.0)
Monocytes Relative: 10.2 % (ref 3.0–12.0)
Neutro Abs: 3.1 10*3/uL (ref 1.4–7.7)
Neutrophils Relative %: 63.9 % (ref 43.0–77.0)
Platelets: 214 10*3/uL (ref 150.0–400.0)
RBC: 4.71 Mil/uL (ref 4.22–5.81)
RDW: 13.9 % (ref 11.5–15.5)
WBC: 4.9 10*3/uL (ref 4.0–10.5)

## 2016-04-17 LAB — LIPID PANEL
Cholesterol: 159 mg/dL (ref 0–200)
HDL: 46.3 mg/dL (ref >39.00)
LDL Cholesterol: 98 mg/dL (ref 0–99)
NonHDL: 112.83
Total CHOL/HDL Ratio: 3
Triglycerides: 75 mg/dL (ref 0.0–149.0)
VLDL: 15 mg/dL (ref 0.0–40.0)

## 2016-04-17 LAB — BASIC METABOLIC PANEL
BUN: 18 mg/dL (ref 6–23)
CO2: 31 mEq/L (ref 19–32)
Calcium: 9.9 mg/dL (ref 8.4–10.5)
Chloride: 103 mEq/L (ref 96–112)
Creatinine, Ser: 1.55 mg/dL — ABNORMAL HIGH (ref 0.40–1.50)
GFR: 58.84 mL/min — ABNORMAL LOW (ref >60.00)
Glucose, Bld: 146 mg/dL — ABNORMAL HIGH (ref 70–99)
Potassium: 5.2 mEq/L — ABNORMAL HIGH (ref 3.5–5.1)
Sodium: 139 mEq/L (ref 135–145)

## 2016-04-17 LAB — TSH: TSH: 2.46 u[IU]/mL (ref 0.35–4.50)

## 2016-04-17 LAB — VITAMIN D 25 HYDROXY (VIT D DEFICIENCY, FRACTURES): VITD: 51.37 ng/mL (ref 30.00–100.00)

## 2016-04-17 LAB — HEMOGLOBIN A1C: Hgb A1c MFr Bld: 6.9 % — ABNORMAL HIGH (ref 4.6–6.5)

## 2016-04-17 NOTE — Patient Instructions (Signed)
Follow up in 3-4 months to recheck diabetes We'll notify you of your lab results and make any changes if needed Continue to work on healthy diet and regular exercise- you can do it! You are up to date on colonoscopy- yay! You are due to see urology- please schedule at your convenience We'll call you with your audiology appt Call with any questions or concerns Happy [place]!

## 2016-04-17 NOTE — Assessment & Plan Note (Signed)
Pt's PE unchanged from previous and WNL w/ exception of obesity.  UTD on colonoscopy, urology.  Due for Pneumovax today.  Written screening schedule updated and given to pt.  Check labs.  Anticipatory guidance provided.

## 2016-04-17 NOTE — Progress Notes (Signed)
Pre visit review using our clinic review tool, if applicable. No additional management support is needed unless otherwise documented below in the visit note. 

## 2016-04-17 NOTE — Assessment & Plan Note (Signed)
>>  ASSESSMENT AND PLAN FOR DIABETES MELLITUS WITH RENAL MANIFESTATION ([facility]) WRITTEN ON 04/17/2016  8:34 AM BY Sheliah Hatch, MD  Chronic problem.  Diet controlled.  Asymptomatic.  UTD on foot exam, eye exam, and on ACE for renal protection.  Stressed need for low carb diet and regular exercise.  Check labs.  Start meds prn.

## 2016-04-17 NOTE — Assessment & Plan Note (Signed)
Chronic problem.  Check labs.  Replete prn. 

## 2016-04-17 NOTE — Assessment & Plan Note (Signed)
Chronic problem.  Well controlled today.  Asymptomatic.  Check labs.  No anticipated med changes.  Will follow. 

## 2016-04-17 NOTE — Assessment & Plan Note (Signed)
Chronic problem.  Diet controlled.  Asymptomatic.  UTD on foot exam, eye exam, and on ACE for renal protection.  Stressed need for low carb diet and regular exercise.  Check labs.  Start meds prn.

## 2016-04-17 NOTE — Addendum Note (Signed)
Addended by: Davis Gourd on: 04/17/2016 09:02 AM   Modules accepted: Orders

## 2016-04-17 NOTE — Progress Notes (Addendum)
   Subjective:    Patient ID: [name], male    DOB: 08-09-1955, 61 y.o.   MRN: [id number]  HPI Here today for CPE.  Risk Factors: DM- chronic problem, diet controlled.  UTD on eye exam, foot exam.  On ACE for renal protection.  Pt has gained 5 lbs. Hyperlipidemia- chronic problem, on Lipitor 20mg  daily HTN- chronic problem, on Benazepril daily w/ good control Vit D deficiency- chronic problem, due for repeat labs Physical Activity: Fall Risk: low Depression: chronic problem, on Prozac w/ good control Hearing: decreased hearing.  Pt has not seen audiology but is interested in pursuing this ADL's: independent Cognitive: normal linear thought process, memory and attention intact Home Safety: safe at home, lives w/ wife Height, Weight, BMI, Visual Acuity: see vitals, vision corrected to 20/20 w/ glasses Counseling: UTD on colonoscopy, urology.  Due for Pneumovax. Care team reviewed and updated w/ pt Labs Ordered: See A&P Care Plan: See A&P    Review of Systems Patient reports no vision changes, anorexia, fever ,adenopathy, persistant/recurrent hoarseness, swallowing issues, chest pain, palpitations, edema, persistant/recurrent cough, hemoptysis, dyspnea (rest,exertional, paroxysmal nocturnal), gastrointestinal  bleeding (melena, rectal bleeding), abdominal pain, excessive heart burn, GU symptoms (dysuria, hematuria, voiding/incontinence issues) syncope, focal weakness, memory loss, numbness & tingling, skin/hair/nail changes, depression, anxiety, abnormal bruising/bleeding, musculoskeletal symptoms/signs.     Objective:   Physical Exam General Appearance:    Alert, cooperative, no distress, appears stated age  Head:    Normocephalic, without obvious abnormality, atraumatic  Eyes:    PERRL, conjunctiva/corneas clear, EOM's intact, fundi    benign, both eyes       Ears:    Normal TM's and external ear canals, both ears  Nose:   Nares normal, septum midline, mucosa normal, no  drainage   or sinus tenderness  Throat:   Lips, mucosa, and tongue normal; teeth and gums normal  Neck:   Supple, symmetrical, trachea midline, no adenopathy;       thyroid:  No enlargement/tenderness/nodules  Back:     Symmetric, no curvature, ROM normal, no CVA tenderness  Lungs:     Clear to auscultation bilaterally, respirations unlabored  Chest wall:    No tenderness or deformity  Heart:    Regular rate and rhythm, S1 and S2 normal, no murmur, rub   or gallop  Abdomen:     Soft, non-tender, bowel sounds active all four quadrants,    no masses, no organomegaly  Genitalia:    Deferred to urology  Rectal:    Extremities:   Extremities normal, atraumatic, no cyanosis or edema  Pulses:   2+ and symmetric all extremities  Skin:   Skin color, texture, turgor normal, no rashes or lesions  Lymph nodes:   Cervical, supraclavicular, and axillary nodes normal  Neurologic:   CNII-XII intact. Normal strength, sensation and reflexes      throughout          Assessment & Plan:

## 2016-04-17 NOTE — Assessment & Plan Note (Signed)
Chronic problem.  Tolerating statin w/o difficulty.  Stressed need for healthy diet and regular exercise.  Check labs.  Adjust meds prn  

## 2016-04-20 ENCOUNTER — Encounter: Payer: Self-pay | Admitting: General Practice

## 2016-04-21 ENCOUNTER — Other Ambulatory Visit: Payer: Self-pay | Admitting: General Practice

## 2016-04-21 MED ORDER — ATORVASTATIN CALCIUM 20 MG PO TABS: 20.0000 mg | ORAL_TABLET | Freq: Every day | ORAL | 0 refills | Status: DC

## 2016-04-21 MED ORDER — BENAZEPRIL HCL 20 MG PO TABS: 20.0000 mg | ORAL_TABLET | Freq: Every day | ORAL | 0 refills | Status: DC

## 2016-04-30 DIAGNOSIS — N3941 Urge incontinence: Secondary | ICD-10-CM | POA: Diagnosis not present

## 2016-04-30 DIAGNOSIS — N401 Enlarged prostate with lower urinary tract symptoms: Secondary | ICD-10-CM | POA: Diagnosis not present

## 2016-05-14 ENCOUNTER — Encounter (INDEPENDENT_AMBULATORY_CARE_PROVIDER_SITE_OTHER): Payer: Self-pay | Admitting: Orthopaedic Surgery

## 2016-05-14 ENCOUNTER — Ambulatory Visit (INDEPENDENT_AMBULATORY_CARE_PROVIDER_SITE_OTHER): Payer: PPO

## 2016-05-14 ENCOUNTER — Ambulatory Visit (INDEPENDENT_AMBULATORY_CARE_PROVIDER_SITE_OTHER): Payer: Self-pay | Admitting: Orthopaedic Surgery

## 2016-05-14 ENCOUNTER — Ambulatory Visit (INDEPENDENT_AMBULATORY_CARE_PROVIDER_SITE_OTHER): Payer: PPO | Admitting: Orthopaedic Surgery

## 2016-05-14 VITALS — BP 116/66 | HR 70 | Resp 14 | Ht 71.5 in | Wt 240.0 lb

## 2016-05-14 DIAGNOSIS — M5440 Lumbago with sciatica, unspecified side: Secondary | ICD-10-CM

## 2016-05-14 DIAGNOSIS — G8929 Other chronic pain: Secondary | ICD-10-CM

## 2016-05-14 NOTE — Progress Notes (Signed)
Office Visit Note   Patient: [name]           Date of Birth: 06/10/1955           MRN: [id number] Visit Date: 05/14/2016              Requested by: Midge Minium, MD [address] [place], [place] [address] PCP: Midge Minium, MD   Assessment & Plan: Visit Diagnoses:  1. Bilateral low back pain with sciatica, sciatica laterality unspecified, unspecified chronicity   [name] is a combination of left hip and low back pain. This pain at least by exam today seems more consistent with an etiology of his left hip. There are degenerative changes by plain film. I'd like to obtain an MRI scan sure that there is no other pathology and then decide on treatment whether to inject his hip or consider an MRI scan of his lumbar spine he does have considerable degenerative disc disease at L5-S1 with a prior fusion at L4-5. Pain seems to be a mild acute exacerbation on a chronic problem that he had for many years. Total time spent was between 30 and 40 minutes regards to diagnosis counseling in terms of treatment options depending upon the scan results. If the scan confirms the arthritis and hip we might consider cortisone injection. Might also consider MRI scan of lumbar spine  Plan: As above  Follow-Up Instructions: Return after MRI left hip.   Orders:  Orders Placed This Encounter  Procedures  . XR Lumbar Spine 2-3 Views  . XR HIP UNILAT W OR W/O PELVIS 1V LEFT  . MR Hip Left w/o contrast   No orders of the defined types were placed in this encounter.     Procedures: No procedures performed   Clinical Data: No additional findings.   Subjective: Chief Complaint  Patient presents with  . Lower Back - Pain, Numbness    [name] is a 28 y o that presents with left sided Low back pain that moves in to the groin, hip and down to left knee. HX of 2 back surgeries 10+ yrs ago controlled diabetic  [name] is had a prior fusion at L4-5 8-10 years ago by Dr. Sherwood Gambler.  He's had a follow-up with any years so the surgery but not since. He's had chronic pain with his back left pelvis and left hip. More recently in the last several months she's had a bit more pain along the lateral aspect of his hip is left groin and some numbness and was left thigh. He is not really having much pain distal to his knee. He denies any injury or trauma. He has been on gabapentin chronically. He is not having any particular trouble on the right side  HPI  Review of Systems   Objective: Vital Signs: BP 116/66   Pulse 70   Resp 14   Ht 5' 11.5" (1.816 m)   Wt 240 lb (108.9 kg)   BMI 33.01 kg/m   Physical Exam  Ortho Exam straight leg raise is negative bilaterally. Reflexes are depressed but is symmetrical. Painless range of motion of both knees and both ankles good pulses distally no swelling distally. Skin intact. Sensory exam appears to be intact. Motor exam intact as well. Does have some mild tenderness along the lateral aspect of his left hip and mild pain in his left hip with internal and external rotation. No percussible tenderness of the lumbar spine.  Specialty Comments:  No specialty comments available.  Imaging: Xr Hip Unilat W Or W/o Pelvis 1v Left  Result Date: 05/14/2016 AP of the pelvis and lateral of the left hip are obtained. There is considerable degenerative change in the left femoral head with inferior osteophyte. There also numerous large cysts within the femoral head and to a lesser extent the acetabulum. The joint space is still well maintained I don't see any abnormalities with the intertrochanteric area or the proximal femur  Xr Lumbar Spine 2-3 Views  Result Date: 05/14/2016 Films of the lumbar spine obtained in 2 projections i.e. AP and lateral. There is an old fusion device at the spinous processes between L4 and L5 looks like this fusion is solid posteriorly. There is considerable degenerative disc disease at L5-S1 with decrease in the joint space  and large anterior and posterior osteophytes. There is a minimal right lumbar scoliosis of approximately 5. There is also considerable of bowel gas, bladder 8 some of the details.    PMFS History: Patient Active Problem List   Diagnosis Date Noted  . Abdominal bloating 05/15/2015  . UTI (urinary tract infection) 04/20/2014  . Prostatitis 04/20/2014  . Hyperglycemia 04/20/2014  . Fatigue 03/07/2014  . Family history of early CAD 03/07/2014  . Maxillary sinusitis, acute 12/06/2013  . Tinea versicolor 12/06/2013  . Physical exam 11/06/2013  . Low back pain 07/05/2013  . Depression 03/29/2013  . Other malaise and fatigue 03/29/2013  . Decreased libido 03/29/2013  . Hematuria 02/17/2013  . Right elbow pain 12/15/2012  . Pustular folliculitis 07/05/1599  . Left ankle pain 03/15/2012  . HTN (hypertension) 12/01/2011  . Renal insufficiency 12/01/2011  . SPINAL STENOSIS, LUMBAR 08/02/2008  . Vitamin D deficiency 11/30/2007  . GOUT 11/30/2007  . BPH (benign prostatic hyperplasia) 11/30/2007  . Diabetes mellitus with renal manifestation ([place]) 06/11/2006  . ANEMIA NEC 06/11/2006  . Back pain with left-sided radiculopathy 06/11/2006  . Hyperlipidemia associated with type 2 diabetes mellitus ([place]) 05/26/2006  . OBSTRUCTIVE SLEEP APNEA 05/26/2006   Past Medical History:  Diagnosis Date  . Carpal tunnel syndrome, bilateral   . Carpal tunnel syndrome, right    nerve impingement right arm/wears brace  . Diabetes mellitus    no meds  . GERD (gastroesophageal reflux disease)   . Gout   . Hepatic steatosis   . Hyperlipidemia   . Hypertension   . Obstructive sleep apnea     Family History  Problem Relation Age of Onset  . Heart disease Father        pacer  . Urolithiasis Father   . Heart attack Father   . Aneurysm Mother 4       CNS aneurysm  . Sudden death Mother   . Diabetes Sister   . Cancer Maternal Uncle        ? primary  . Cancer Paternal Uncle        ? primary  .  Kidney disease Brother   . Heart disease Sister   . Heart disease Sister   . Hyperlipidemia Neg Hx   . Hypertension Neg Hx   . Colon cancer Neg Hx     Past Surgical History:  Procedure Laterality Date  . CARPAL TUNNEL RELEASE  09/10/2015   RIGHT WRIST / WITH NERVE IMPINGEMENT SURGERY  . CERVICAL FUSION     Dr Sherwood Gambler  . COLONOSCOPY  2007   negative; [facility]  . ESOPHAGEAL DILATION  2007  . 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 LUMBAR FUSION      Dr Sherwood Gambler   Social History   Occupational History  . Not on file.   Social History Main Topics  . Smoking status: Former Smoker    Quit date: 01/06/1976  . Smokeless tobacco: Never Used     Comment: smoked 35 years ago as of 2013   . Alcohol use No  . Drug use: No  . Sexual activity: Not on file     Garald Balding, MD   Note - This record has been created using [employer].  Chart creation errors have been sought, but may not always  have been located. Such creation errors do not reflect on  the standard of medical care.

## 2016-05-15 DIAGNOSIS — Z125 Encounter for screening for malignant neoplasm of prostate: Secondary | ICD-10-CM | POA: Diagnosis not present

## 2016-05-15 DIAGNOSIS — N3941 Urge incontinence: Secondary | ICD-10-CM | POA: Diagnosis not present

## 2016-05-25 ENCOUNTER — Other Ambulatory Visit (INDEPENDENT_AMBULATORY_CARE_PROVIDER_SITE_OTHER): Payer: Self-pay

## 2016-05-25 ENCOUNTER — Telehealth (INDEPENDENT_AMBULATORY_CARE_PROVIDER_SITE_OTHER): Payer: Self-pay | Admitting: Orthopaedic Surgery

## 2016-05-25 MED ORDER — DIAZEPAM 10 MG PO TABS: 10.0000 mg | ORAL_TABLET | Freq: Once | ORAL | 0 refills | Status: AC

## 2016-05-25 NOTE — Telephone Encounter (Signed)
done

## 2016-05-25 NOTE — Telephone Encounter (Signed)
Valium 10 mg 30 min prior to study

## 2016-05-25 NOTE — Telephone Encounter (Signed)
Please advise 

## 2016-05-25 NOTE — Telephone Encounter (Signed)
Patient has an upcoming Mri on Thursday am. Patient is claustrophobic, and request something to help him get thru study. Patient uses Oncologist. Please call patient to advise.

## 2016-05-28 ENCOUNTER — Ambulatory Visit
Admission: RE | Admit: 2016-05-28 | Discharge: 2016-05-28 | Disposition: A | Discharge status: Home / Self Care | Payer: PPO | Source: Ambulatory Visit | Attending: Orthopaedic Surgery | Admitting: Orthopaedic Surgery

## 2016-05-28 DIAGNOSIS — M1612 Unilateral primary osteoarthritis, left hip: Secondary | ICD-10-CM | POA: Diagnosis not present

## 2016-05-28 DIAGNOSIS — M5440 Lumbago with sciatica, unspecified side: Secondary | ICD-10-CM

## 2016-06-02 ENCOUNTER — Encounter (INDEPENDENT_AMBULATORY_CARE_PROVIDER_SITE_OTHER): Payer: Self-pay | Admitting: Orthopaedic Surgery

## 2016-06-02 ENCOUNTER — Ambulatory Visit (INDEPENDENT_AMBULATORY_CARE_PROVIDER_SITE_OTHER): Payer: PPO | Admitting: Orthopaedic Surgery

## 2016-06-02 ENCOUNTER — Other Ambulatory Visit: Payer: Self-pay | Admitting: Family Medicine

## 2016-06-02 VITALS — BP 114/73 | HR 65 | Resp 65 | Ht 71.5 in | Wt 240.0 lb

## 2016-06-02 DIAGNOSIS — M25552 Pain in left hip: Secondary | ICD-10-CM | POA: Diagnosis not present

## 2016-06-02 NOTE — Progress Notes (Signed)
Office Visit Note   Patient: [name]           Date of Birth: 02/13/1955           MRN: [id number] Visit Date: 06/02/2016              Requested by: Midge Minium, MD [address] [place], [place] [address] PCP: Midge Minium, MD   Assessment & Plan: Visit Diagnoses:  1. 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 Ernestina Patches to consider cortisone injection left hip. He might have several potential etiologies for his left thigh and groin pain. If the injection is not helpful I will obtain an MRI scan of his lumbar spine  Follow-Up Instructions: Return in about 1 month (around 07/03/2016).   Orders:  Orders Placed This Encounter  Procedures  . Ambulatory referral to Physical Medicine Rehab   No orders of the defined types were placed in this encounter.     Procedures: No procedures performed   Clinical Data: No additional findings.   Subjective: Chief Complaint  Patient presents with  . Lower Back - Results    MRI of lumbar spine  [name] is having pain beginning in the area of his left buttock radiating to the greater trochanteric area and his left groin. Occasionally he'll have some numbness in his left thigh. His symptoms by history appear to be related to his hip. I did order an MRI scan of his pelvis demonstrating moderate degenerative changes of both hips.  HPI  Review of Systems   Objective: Vital Signs: BP 114/73   Pulse 65   Resp (!) 65   Ht 5' 11.5" (1.816 m)   Wt 240 lb (108.9 kg)   BMI 33.01 kg/m   Physical Exam  Ortho Exam some pain in left hip on the extreme of internal/external rotation. No thigh discomfort to palpation. No pain over the greater trochanter. Straight leg raise negative bilaterally. Neurovascular exam intact.  Specialty Comments:  No specialty comments available.  Imaging: No results found.   PMFS History: Patient Active Problem List   Diagnosis Date Noted    . Abdominal bloating 05/15/2015  . UTI (urinary tract infection) 04/20/2014  . Prostatitis 04/20/2014  . Hyperglycemia 04/20/2014  . Fatigue 03/07/2014  . Family history of early CAD 03/07/2014  . Maxillary sinusitis, acute 12/06/2013  . Tinea versicolor 12/06/2013  . Physical exam 11/06/2013  . Low back pain 07/05/2013  . Depression 03/29/2013  . Other malaise and fatigue 03/29/2013  . Decreased libido 03/29/2013  . Hematuria 02/17/2013  . Right elbow pain 12/15/2012  . Pustular folliculitis 61/60/7371  . Left ankle pain 03/15/2012  . HTN (hypertension) 12/01/2011  . Renal insufficiency 12/01/2011  . SPINAL STENOSIS, LUMBAR 08/02/2008  . Vitamin D deficiency 11/30/2007  . GOUT 11/30/2007  . BPH (benign prostatic hyperplasia) 11/30/2007  . Diabetes mellitus with renal manifestation ([place]) 06/11/2006  . ANEMIA NEC 06/11/2006  . Back pain with left-sided radiculopathy 06/11/2006  . Hyperlipidemia associated with type 2 diabetes mellitus ([place]) 05/26/2006  . OBSTRUCTIVE SLEEP APNEA 05/26/2006   Past Medical History:  Diagnosis Date  . Carpal tunnel syndrome, bilateral   . Carpal tunnel syndrome, right    nerve impingement right arm/wears brace  . Diabetes mellitus    no meds  . GERD (gastroesophageal reflux disease)   . Gout   . Hepatic steatosis   . Hyperlipidemia   .  Hypertension   . Obstructive sleep apnea     Family History  Problem Relation Age of Onset  . Heart disease Father        pacer  . Urolithiasis Father   . Heart attack Father   . Aneurysm Mother 70       CNS aneurysm  . Sudden death Mother   . Diabetes Sister   . Cancer Maternal Uncle        ? primary  . Cancer Paternal Uncle        ? primary  . Kidney disease Brother   . Heart disease Sister   . Heart disease Sister   . Hyperlipidemia Neg Hx   . Hypertension Neg Hx   . Colon cancer Neg Hx     Past Surgical History:  Procedure Laterality Date  . CARPAL TUNNEL RELEASE  09/10/2015   RIGHT  WRIST / WITH NERVE IMPINGEMENT SURGERY  . CERVICAL FUSION     Dr Sherwood Gambler  . COLONOSCOPY  2007   negative;  [facility]  . ESOPHAGEAL DILATION  2007  . 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 LUMBAR FUSION      Dr Sherwood Gambler   Social History   Occupational History  . Not on file.   Social History Main Topics  . Smoking status: Former Smoker    Quit date: 01/06/1976  . Smokeless tobacco: Never Used     Comment: smoked 35 years ago as of 2013   . Alcohol use No  . Drug use: No  . Sexual activity: Not on file     [name] Balding, MD   Note - This record has been created using [employer].  Chart creation errors have been sought, but may not always  have been located. Such creation errors do not reflect on  the standard of medical care.

## 2016-06-05 ENCOUNTER — Ambulatory Visit (INDEPENDENT_AMBULATORY_CARE_PROVIDER_SITE_OTHER): Payer: PPO | Admitting: Orthopaedic Surgery

## 2016-06-16 ENCOUNTER — Ambulatory Visit (INDEPENDENT_AMBULATORY_CARE_PROVIDER_SITE_OTHER): Payer: PPO | Admitting: Physical Medicine and Rehabilitation

## 2016-06-16 ENCOUNTER — Ambulatory Visit (INDEPENDENT_AMBULATORY_CARE_PROVIDER_SITE_OTHER): Payer: PPO

## 2016-06-16 ENCOUNTER — Encounter (INDEPENDENT_AMBULATORY_CARE_PROVIDER_SITE_OTHER): Payer: Self-pay | Admitting: Physical Medicine and Rehabilitation

## 2016-06-16 DIAGNOSIS — M25552 Pain in left hip: Secondary | ICD-10-CM

## 2016-06-16 NOTE — Progress Notes (Deleted)
Left hip pain for several years. Worse recently. Affecting daily life. Constant pain but pain level varies. Catching pain in groin. Radiating pain and numbness in thigh.

## 2016-06-16 NOTE — Progress Notes (Signed)
[place]us Ice - 61 y.o. male MRN [id number]  Date of birth: [birth year]  Office Visit Note: Visit Date: 06/16/2016 PCP: Midge Minium, MD Referred by: Midge Minium, MD  Subjective: Chief Complaint  Patient presents with  . Left Hip - Pain   HPI: Mr. Grider is a 61 year old gentleman with left hip and groin pain with some catching sensation in the groin. He does have history of this for several years but worsening recently. He also talks about a little bit of numbness in the lateral thigh. He has an MRI of his hips showing moderate bilateral degenerative changes. He is followed by Dr. Durward Fortes who we will get a follow-up appointment for. He is requesting left hip anesthetic arthrogram to help determine source of his pain. Patient is a diabetic. Last hemoglobin A1c's were fairly good. We discussed briefly the cortisone injection may increase his blood sugar slightly with this would not change his diabetes long-term.    ROS Otherwise per HPI.  Assessment & Plan: Visit Diagnoses:  1. Pain in left hip     Plan: Findings:  Left hip diagnostic and therapeutic anesthetic arthrogram. Patient did have relief during the anesthetic phase.    Meds & Orders: No orders of the defined types were placed in this encounter.   Orders Placed This Encounter  Procedures  . Large Joint Injection/Arthrocentesis  . XR C-ARM NO REPORT    Follow-up: Return if symptoms worsen or fail to improve, for Dr. Durward Fortes.   Procedures: Hip anesthetic arthrogram Date/Time: 06/16/2016 12:55 PM Performed by: Magnus Sinning Authorized by: Magnus Sinning   Consent Given by:  Patient Site marked: the procedure site was marked   Timeout: prior to procedure the correct patient, procedure, and site was verified   Indications:  Pain and diagnostic evaluation Location:  Hip Site:  L hip joint Prep: patient was prepped and draped in usual sterile fashion   Needle Size:  22 G Approach:   Anterior Ultrasound Guidance: No   Fluoroscopic Guidance: No   Arthrogram: Yes   Medications:  3 mL bupivacaine 0.5 %; 60 mg triamcinolone acetonide 40 MG/ML Aspiration Attempted: Yes   Patient tolerance:  Patient tolerated the procedure well with no immediate complications  Arthrogram demonstrated excellent flow of contrast throughout the joint surface without extravasation or obvious defect.  The patient had relief of symptoms during the anesthetic phase of the injection.      No notes on file   Clinical History: IMPRESSION: 1. Moderate bilateral hip joint degenerative changes but no stress fracture or AVN. 2. Mild bilateral peritendinosis but no tendon tear or trochanteric bursitis. 3. Bilateral hamstring tendinopathy. 4. No significant intrapelvic abnormalities. 5. Advanced degenerative disc disease and facet disease noted at L5-S1.   Electronically Signed   By: Marijo Sanes M.D.   On: 05/28/2016 09:31  He reports that he quit smoking about 40 years ago. He has never used smokeless tobacco.   Recent Labs  11/11/15 1006 04/17/16 0835  HGBA1C 6.4 6.9*    Objective:  VS:  HT:    WT:   BMI:     BP:   HR: bpm  TEMP: ( )  RESP:  Physical Exam  Musculoskeletal:  Patient ambulates without aid with good distal strength and does have pain with hip motion.    Ortho Exam Imaging: Xr C-arm No Report  Result Date: 06/16/2016 Please see Notes or Procedures tab for imaging impression.   Past Medical/Family/Surgical/Social History: Medications & Allergies reviewed per  EMR Patient Active Problem List   Diagnosis Date Noted  . Abdominal bloating 05/15/2015  . UTI (urinary tract infection) 04/20/2014  . Prostatitis 04/20/2014  . Hyperglycemia 04/20/2014  . Fatigue 03/07/2014  . Family history of early CAD 03/07/2014  . Maxillary sinusitis, acute 12/06/2013  . Tinea versicolor 12/06/2013  . Physical exam 11/06/2013  . Low back pain 07/05/2013  . Depression  03/29/2013  . Other malaise and fatigue 03/29/2013  . Decreased libido 03/29/2013  . Hematuria 02/17/2013  . Right elbow pain 12/15/2012  . Pustular folliculitis 19/62/2297  . Left ankle pain 03/15/2012  . HTN (hypertension) 12/01/2011  . Renal insufficiency 12/01/2011  . SPINAL STENOSIS, LUMBAR 08/02/2008  . Vitamin D deficiency 11/30/2007  . GOUT 11/30/2007  . BPH (benign prostatic hyperplasia) 11/30/2007  . Diabetes mellitus with renal manifestation ([place]) 06/11/2006  . ANEMIA NEC 06/11/2006  . Back pain with left-sided radiculopathy 06/11/2006  . Hyperlipidemia associated with type 2 diabetes mellitus ([place]) 05/26/2006  . OBSTRUCTIVE SLEEP APNEA 05/26/2006   Past Medical History:  Diagnosis Date  . Carpal tunnel syndrome, bilateral   . Carpal tunnel syndrome, right    nerve impingement right arm/wears brace  . Diabetes mellitus    no meds  . GERD (gastroesophageal reflux disease)   . Gout   . Hepatic steatosis   . Hyperlipidemia   . Hypertension   . Obstructive sleep apnea    Family History  Problem Relation Age of Onset  . Heart disease Father        pacer  . Urolithiasis Father   . Heart attack Father   . Aneurysm Mother 72       CNS aneurysm  . Sudden death Mother   . Diabetes Sister   . Cancer Maternal Uncle        ? primary  . Cancer Paternal Uncle        ? primary  . Kidney disease Brother   . Heart disease Sister   . Heart disease Sister   . Hyperlipidemia Neg Hx   . Hypertension Neg Hx   . Colon cancer Neg Hx    Past Surgical History:  Procedure Laterality Date  . CARPAL TUNNEL RELEASE  09/10/2015   RIGHT WRIST / WITH NERVE IMPINGEMENT SURGERY  . CERVICAL FUSION     Dr Sherwood Gambler  . COLONOSCOPY  2007   negative; [facility]  . ESOPHAGEAL DILATION  2007  . 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 LUMBAR FUSION      Dr Sherwood Gambler   Social History   Occupational History  . Not on file.   Social History Main  Topics  . Smoking status: Former Smoker    Quit date: 01/06/1976  . Smokeless tobacco: Never Used     Comment: smoked 35 years ago as of 2013   . Alcohol use No  . Drug use: No  . Sexual activity: Not on file

## 2016-06-16 NOTE — Patient Instructions (Signed)

## 2016-06-17 ENCOUNTER — Encounter (INDEPENDENT_AMBULATORY_CARE_PROVIDER_SITE_OTHER): Payer: Self-pay | Admitting: Physical Medicine and Rehabilitation

## 2016-06-17 MED ORDER — TRIAMCINOLONE ACETONIDE 40 MG/ML IJ SUSP
60.0000 mg | INTRAMUSCULAR | Status: AC | PRN
  Administered 2016-06-16: 60 mg via INTRA_ARTICULAR

## 2016-06-17 MED ORDER — BUPIVACAINE HCL 0.5 % IJ SOLN
3.0000 mL | INTRAMUSCULAR | Status: AC | PRN
  Administered 2016-06-16: 3 mL via INTRA_ARTICULAR

## 2016-07-06 ENCOUNTER — Other Ambulatory Visit: Payer: Self-pay | Admitting: Family Medicine

## 2016-07-07 ENCOUNTER — Ambulatory Visit: Payer: PPO | Attending: Family Medicine | Admitting: Audiology

## 2016-07-07 DIAGNOSIS — H90A32 Mixed conductive and sensorineural hearing loss, unilateral, left ear with restricted hearing on the contralateral side: Secondary | ICD-10-CM | POA: Diagnosis not present

## 2016-07-07 DIAGNOSIS — H938X2 Other specified disorders of left ear: Secondary | ICD-10-CM | POA: Insufficient documentation

## 2016-07-07 DIAGNOSIS — R94120 Abnormal auditory function study: Secondary | ICD-10-CM | POA: Diagnosis not present

## 2016-07-07 DIAGNOSIS — R292 Abnormal reflex: Secondary | ICD-10-CM | POA: Diagnosis not present

## 2016-07-07 DIAGNOSIS — H90A21 Sensorineural hearing loss, unilateral, right ear, with restricted hearing on the contralateral side: Secondary | ICD-10-CM | POA: Diagnosis not present

## 2016-07-07 DIAGNOSIS — H9202 Otalgia, left ear: Secondary | ICD-10-CM | POA: Diagnosis not present

## 2016-07-07 NOTE — Procedures (Signed)
[facility]  [place], [place] [address]  [phone]   Audiological Evaluation  Patient Name: [name]   Status: Outpatient   DOB: March 24, 1955    Diagnosis: Hearing Loss                 MRN: [id number] Date:  07/07/2016     Referent: Midge Minium, MD  History: [name] was seen for an audiological evaluation. Accompanied by: His wife Primary Concern: Hearing loss for at least the past 6 months.  "Feels like going up in the mountain - lots of pressure on the left side" and "aches". About one "year ago flew" and when came "back my ear would not pop". "Went to Midge Minium, MD and started Flonase".  Flonase "helps but does not get rid of the problem".  Wife sometimes sounds "muffled".  Pain: None History of hearing problems: Y / N History of ear infections:   N History of ear surgery or "tubes" : N History of dizziness/vertigo:   Y - occasionally and then "head starts hurting". "Lay down until it passes". History of balance issues:  Y - "off--balance" all of the time. Tinnitus: Y- high pitched tinnitus, "comes and goes".  Sound sensitivity: N History of occupational noise exposure: Y - contract work, remodel, use air gun. 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 History of hypertension: Y - controlled with medication. History of diabetes:  Y - controlled with diet.  Family history of hearing loss:  N   Evaluation: Conventional pure tone audiometry from 250Hz  - 8000Hz  with using insert earphones.  Hearing Thresholds are symmetrical ranging from 20 dBHL at 250Hz  to 30-40 dBHL from 500Hz  - 8000Hz  bilaterally. 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 Reliability is good Speech reception levels (repeating words near threshold) using recorded spondee word lists:  Right ear: 35 dBHL.  Left ear:  35 dBHL Word recognition (at comfortably loud volumes) using  recorded word lists at 75 dBHL, in quiet.  Right ear: 92%.  Left ear:   96% Word recognition in minimal background noise:  +5 dBHL  Right ear: 72%                              Left ear:  80%  Tympanometry (middle ear function) with ipsilateral acoustic reflexes from 500Hz  - 4000Hz .  Right ear: Shallow tympanic membrane movement (Type As) with present but elevated acoustic reflexes.  Left ear: Abnormal middle ear function with negative pressure and shallow tympanic membrane movement  (Type C) with present but elevated acoustic reflexes.  Otoscopic shows no ear wax, no redness with slightly cloudy tympanic membrane bilaterally.  CONCLUSION:      [name] has a slight to mild hearing loss bilaterally that has a mixed component on the left side and a sensorineural component on the right side. The left side has abnormal middle ear function because of the negative pressure and shallow tympanic membrane mobility which is consistent with the "left ear pain" and "left ear pressure" reported by [name]. The right ear has shallow tympanic membrane mobility.  his amount of hearing loss would adversely affect speech communication at normal conversational speech levels. Word recognition is excellent in each ear in quiet at loud conversational speech levels bilaterally. In minimal background noise, word recognition remains good in the left ear  and drops to fair in the right ear.      [name] needs referral to an ENT because of the persistent left ear pressure and ear pain, even though he states that he uses "Flonase every day". The test results were discussed and [name] counseled.  RECOMMENDATIONS: 1.   Refer to ENT for left ear pain, left mixed hearing loss and bilateral abnormal middle ear function - worse on the left side. 2.   Monitor hearing closely with a repeat audiological evaluation in 6-12 months (earlier if there is any change in hearing or ear pressure).   3.  Strategies  that help improve hearing include: A) Face the speaker directly. Optimal is having the speakers face well - lit.  Unless amplified, being within 3-6 feet of the speaker will enhance word recognition. B) Avoid having the speaker back-lit as this will minimize the ability to use cues from lip-reading, facial expression and gestures. C)  Word recognition is poorer in background noise. For optimal word recognition, turn off the TV, radio or noisy fan when engaging in conversation. In a restaurant, try to sit away from noise sources and close to the primary speaker.        D)  Ask for topic clarification from time to time in order to remain in the conversation.  Most people don't mind repeating or clarifying a point when asked.  If needed, explain the difficulty hearing in background noise or hearing loss.  Deborah L. Heide Spark, Au.D., CCC-A Doctor of Audiology 07/07/2016  cc: Midge Minium, MD

## 2016-07-13 ENCOUNTER — Other Ambulatory Visit: Payer: Self-pay | Admitting: Family Medicine

## 2016-07-13 DIAGNOSIS — H9202 Otalgia, left ear: Secondary | ICD-10-CM

## 2016-07-13 NOTE — Progress Notes (Signed)
Referral placed.

## 2016-07-14 ENCOUNTER — Other Ambulatory Visit: Payer: Self-pay | Admitting: Family Medicine

## 2016-07-15 ENCOUNTER — Encounter: Payer: Self-pay | Admitting: Family Medicine

## 2016-08-19 DIAGNOSIS — H903 Sensorineural hearing loss, bilateral: Secondary | ICD-10-CM | POA: Diagnosis not present

## 2016-08-19 DIAGNOSIS — H6983 Other specified disorders of Eustachian tube, bilateral: Secondary | ICD-10-CM | POA: Diagnosis not present

## 2016-08-19 DIAGNOSIS — H938X3 Other specified disorders of ear, bilateral: Secondary | ICD-10-CM | POA: Diagnosis not present

## 2016-09-01 DIAGNOSIS — H6983 Other specified disorders of Eustachian tube, bilateral: Secondary | ICD-10-CM | POA: Diagnosis not present

## 2016-10-06 DIAGNOSIS — H903 Sensorineural hearing loss, bilateral: Secondary | ICD-10-CM | POA: Diagnosis not present

## 2016-10-06 DIAGNOSIS — Z9622 Myringotomy tube(s) status: Secondary | ICD-10-CM | POA: Diagnosis not present

## 2016-10-06 DIAGNOSIS — H2513 Age-related nuclear cataract, bilateral: Secondary | ICD-10-CM | POA: Diagnosis not present

## 2016-10-06 DIAGNOSIS — H6983 Other specified disorders of Eustachian tube, bilateral: Secondary | ICD-10-CM | POA: Diagnosis not present

## 2016-10-06 DIAGNOSIS — H40033 Anatomical narrow angle, bilateral: Secondary | ICD-10-CM | POA: Diagnosis not present

## 2016-10-19 ENCOUNTER — Other Ambulatory Visit: Payer: Self-pay | Admitting: Family Medicine

## 2016-12-02 ENCOUNTER — Other Ambulatory Visit: Payer: Self-pay | Admitting: Family Medicine

## 2016-12-07 ENCOUNTER — Encounter: Payer: Self-pay | Admitting: Family Medicine

## 2016-12-07 ENCOUNTER — Ambulatory Visit (INDEPENDENT_AMBULATORY_CARE_PROVIDER_SITE_OTHER): Payer: PPO | Admitting: Family Medicine

## 2016-12-07 ENCOUNTER — Other Ambulatory Visit: Payer: Self-pay | Admitting: Family Medicine

## 2016-12-07 VITALS — BP 112/74 | HR 76 | Ht 71.5 in

## 2016-12-07 DIAGNOSIS — E1169 Type 2 diabetes mellitus with other specified complication: Secondary | ICD-10-CM | POA: Diagnosis not present

## 2016-12-07 DIAGNOSIS — M48061 Spinal stenosis, lumbar region without neurogenic claudication: Secondary | ICD-10-CM

## 2016-12-07 DIAGNOSIS — E1122 Type 2 diabetes mellitus with diabetic chronic kidney disease: Secondary | ICD-10-CM | POA: Diagnosis not present

## 2016-12-07 DIAGNOSIS — E785 Hyperlipidemia, unspecified: Secondary | ICD-10-CM | POA: Diagnosis not present

## 2016-12-07 DIAGNOSIS — N411 Chronic prostatitis: Secondary | ICD-10-CM | POA: Diagnosis not present

## 2016-12-07 DIAGNOSIS — E559 Vitamin D deficiency, unspecified: Secondary | ICD-10-CM | POA: Diagnosis not present

## 2016-12-07 DIAGNOSIS — E1159 Type 2 diabetes mellitus with other circulatory complications: Secondary | ICD-10-CM | POA: Diagnosis not present

## 2016-12-07 DIAGNOSIS — N289 Disorder of kidney and ureter, unspecified: Secondary | ICD-10-CM

## 2016-12-07 DIAGNOSIS — M549 Dorsalgia, unspecified: Secondary | ICD-10-CM

## 2016-12-07 DIAGNOSIS — G4733 Obstructive sleep apnea (adult) (pediatric): Secondary | ICD-10-CM | POA: Diagnosis not present

## 2016-12-07 DIAGNOSIS — I152 Hypertension secondary to endocrine disorders: Secondary | ICD-10-CM

## 2016-12-07 DIAGNOSIS — G8929 Other chronic pain: Secondary | ICD-10-CM | POA: Diagnosis not present

## 2016-12-07 DIAGNOSIS — I10 Essential (primary) hypertension: Secondary | ICD-10-CM

## 2016-12-07 LAB — POCT UA - MICROALBUMIN
Creatinine, POC: 200 mg/dL
Microalbumin Ur, POC: 80 mg/L

## 2016-12-07 NOTE — Progress Notes (Signed)
New patient office visit note:  Impression and Recommendations:    1. Type 2 diabetes mellitus with chronic kidney disease, without long-term current use of insulin, unspecified CKD stage ([place])   2. Hyperlipidemia associated with type 2 diabetes mellitus ([place])   3. Hypertension associated with diabetes ([place])   4. OBSTRUCTIVE SLEEP APNEA   5. Chronic prostatitis   6. Renal insufficiency   7. Vitamin D deficiency   8. SPINAL STENOSIS, LUMBAR   9. Chronic back pain greater than 3 months duration-with chronic left radiculopathy    Dictation #1 [id number]  [id number]  OBSTRUCTIVE SLEEP APNEA Been many yrs since looked at and machine falling apart.  - Dr Halford Chessman- has appt this Friday with him--> told pt to ask Dr Halford Chessman about new machine    Education and routine counseling performed. Handouts provided.  Orders Placed This Encounter  Procedures  . CBC with Differential/Platelet  . Comprehensive metabolic panel  . Hemoglobin A1c  . Lipid panel  . T4, free  . TSH  . VITAMIN D 25 Hydroxy (Vit-D Deficiency, Fractures)  . Ambulatory referral to Sports Medicine  . POCT UA - Microalbumin    Gross side effects, risk and benefits, and alternatives of medications discussed with patient.  Patient is aware that all medications have potential side effects and we are unable to predict every side effect or drug-drug interaction that may occur.  Expresses verbal understanding and consents to current therapy plan and treatment regimen.  Return for Fasting bldwrk-near future;then OV w me 1 wk later.  Please see AVS handed out to patient at the end of our visit for further patient instructions/ counseling done pertaining to today's office visit.    Note: This document was prepared using Dragon voice recognition software and may include unintentional dictation  errors.  ----------------------------------------------------------------------------------------------------------------------    Subjective:    Chief complaint:   Chief Complaint  Patient presents with  . Establish Care     HPI: [name] is a pleasant 61 y.o. male who presents to [place] [facility] today to review their medical history with me and establish care.   I asked the patient to review their chronic problem list with me to ensure everything was updated and accurate.    All recent office visits with other providers, any medical records that patient brought in etc  - I reviewed today.     Also asked pt to get me medical records from [facility] providers/ specialists that they had seen within the past 3-5 years- if they are in private practice and/or do not work for a [employer], [facility], [place], [place] or [employer] owned practice.  Told them to call their specialists to clarify this if they are not sure.    Problem  OBSTRUCTIVE SLEEP APNEA   Qualifier: Diagnosis of  By: Halford Chessman MD, [place]  Controlled with CPAP       Wt Readings from Last 3 Encounters:  12/15/16 250 lb (113.4 kg)  12/11/16 250 lb (113.4 kg)  12/10/16 247 lb (112 kg)   BP Readings from Last 3 Encounters:  12/15/16 (!) 144/88  12/11/16 (!) 158/90  12/10/16 139/82   Pulse Readings from Last 3 Encounters:  12/15/16 77  12/11/16 63  12/10/16 65   BMI Readings from Last 3 Encounters:  12/15/16 33.91 kg/m  12/11/16 33.91 kg/m  12/10/16 33.50 kg/m    Patient Care Team    Relationship Specialty Notifications Start End  Mellody Dance,  DO PCP - General Family Medicine  10/28/16   Rutherford Guys, MD Consulting Physician Ophthalmology  11/23/14   Jovita Gamma, MD Consulting Physician Neurosurgery  11/23/14   Kathie Rhodes, MD Consulting Physician Urology  12/07/14   Milus Banister, MD Attending Physician Gastroenterology  12/07/14   Chesley Mires, MD Consulting Physician  Pulmonary Disease  12/07/16   Garald Balding, MD Consulting Physician Orthopedic Surgery  12/07/16    Comment: back and hip  Dene [place], MD Consulting Physician Sports Medicine  12/07/16    Comment: back and hip pain    Patient Active Problem List   Diagnosis Date Noted  . Hypertension associated with diabetes ([place]) 12/01/2011    Priority: High  . Diabetes mellitus with renal manifestation ([place]) 06/11/2006    Priority: High  . Hyperlipidemia associated with type 2 diabetes mellitus ([place]) 05/26/2006    Priority: High  . OBSTRUCTIVE SLEEP APNEA 05/26/2006    Priority: High  . Prostatitis 04/20/2014    Priority: Medium  . Renal insufficiency 12/01/2011    Priority: Medium  . 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 Vitamin D deficiency 11/30/2007    Priority: Low  . Abdominal bloating 05/15/2015  . UTI (urinary tract infection) 04/20/2014  . Hyperglycemia 04/20/2014  . Fatigue 03/07/2014  . Family history of early CAD 03/07/2014  . Maxillary sinusitis, acute 12/06/2013  . Tinea versicolor 12/06/2013  . Physical exam 11/06/2013  . Depression 03/29/2013  . Other malaise and fatigue 03/29/2013  . Decreased libido 03/29/2013  . Hematuria 02/17/2013  . Right elbow pain 12/15/2012  . Pustular folliculitis 70/01/7492  . Left ankle pain 03/15/2012  . SPINAL STENOSIS, LUMBAR 08/02/2008  . GOUT 11/30/2007  . BPH (benign prostatic hyperplasia) 11/30/2007  . ANEMIA NEC 06/11/2006  . Chronic back pain greater than 3 months duration 06/11/2006     Past Medical History:  Diagnosis Date  . Carpal tunnel syndrome, bilateral   . Carpal tunnel syndrome, right    nerve impingement right arm/wears brace  . Diabetes mellitus    no meds  . GERD (gastroesophageal reflux disease)   . Gout   . Hepatic steatosis   . Hyperlipidemia   . Hypertension   . Obstructive sleep apnea      Past Medical History:  Diagnosis Date  . Carpal tunnel  syndrome, bilateral   . Carpal tunnel syndrome, right    nerve impingement right arm/wears brace  . Diabetes mellitus    no meds  . GERD (gastroesophageal reflux disease)   . Gout   . Hepatic steatosis   . Hyperlipidemia   . Hypertension   . Obstructive sleep apnea      Past Surgical History:  Procedure Laterality Date  . CARPAL TUNNEL RELEASE  09/10/2015   RIGHT WRIST / WITH NERVE IMPINGEMENT SURGERY  . CERVICAL FUSION     Dr Sherwood Gambler  . COLONOSCOPY  2007   negative; [facility]  . ESOPHAGEAL DILATION  2007  . 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 LUMBAR FUSION      Dr Sherwood Gambler     Family History  Problem Relation Age of Onset  . Heart disease Father        pacer  . Urolithiasis Father   . Heart attack Father   . Aneurysm Mother 66       CNS aneurysm  . Sudden death Mother   .  Diabetes Sister   . Cancer Maternal Uncle        ? primary  . Cancer Paternal Uncle        ? primary  . Kidney disease Brother   . Heart disease Sister   . Heart disease Sister   . Hyperlipidemia Neg Hx   . Hypertension Neg Hx   . Colon cancer Neg Hx      Social History   Substance and Sexual Activity  Drug Use No     Social History   Substance and Sexual Activity  Alcohol Use No     Social History   Tobacco Use  Smoking Status Former Smoker  . Last attempt to quit: 01/06/1976  . Years since quitting: 41.0  Smokeless Tobacco Never Used  Tobacco Comment   smoked 35 years ago as of 2013      Outpatient Encounter Medications as of 12/07/2016  Medication Sig  . allopurinol (ZYLOPRIM) 300 MG tablet TAKE 1/2 TABLET BY MOUTH DAILY  . amLODipine (NORVASC) 10 MG tablet TAKE 1/2 TABLET BY MOUTH THREE TIMES PER WEEK  . Ascorbic Acid (VITAMIN C) 1000 MG tablet Take 1,000 mg by mouth daily.  Marland Kitchen aspirin 81 MG tablet Take 81 mg by mouth daily.    Marland Kitchen atorvastatin (LIPITOR) 20 MG tablet TAKE 1 TABLET BY MOUTH DAILY  . benazepril (LOTENSIN) 20 MG  tablet TAKE 1 TABLET BY MOUTH DAILY  . FLUoxetine (PROZAC) 40 MG capsule TAKE 1 CAPSULE BY MOUTH DAILY  . fluticasone (FLONASE) 50 MCG/ACT nasal spray Place 2 sprays into both nostrils daily.  . Multiple Vitamins-Iron (MULTIVITAMINS WITH IRON) TABS Take 1 tablet by mouth daily.  Marland Kitchen MYRBETRIQ 25 MG TB24 tablet   . polyethylene glycol powder (GLYCOLAX/MIRALAX) powder USE AS DIRECTED  . pyridoxine (B-6) 100 MG tablet Take 100 mg by mouth daily.  . traZODone (DESYREL) 50 MG tablet TAKE 1/2 TO 1 TABLET BY MOUTH EVERY NIGHT AT BEDTIME AS NEEDED FOR SLEEP  . [DISCONTINUED] diazepam (VALIUM) 10 MG tablet   . [DISCONTINUED] gabapentin (NEURONTIN) 100 MG capsule TAKE 2 CAPSULES BY MOUTH TWICE DAILY  . [DISCONTINUED] pantoprazole (PROTONIX) 40 MG tablet TAKE 1 TABLET BY MOUTH DAILY   No facility-administered encounter medications on file as of 12/07/2016.     Allergies: Prednisone   ROS   Objective:   Blood pressure 112/74, pulse 76, height 5' 11.5" (1.816 m). Body mass index is 33.01 kg/m. General: Well Developed, well nourished, and in no acute distress.  Neuro: Alert and oriented x3, extra-ocular muscles intact, sensation grossly intact.  HEENT:[place]/AT, PERRLA, neck supple, No carotid bruits Skin: no gross rashes  Cardiac: Regular rate and rhythm Respiratory: Essentially clear to auscultation bilaterally. Not using accessory muscles, speaking in full sentences.  Abdominal: not grossly distended Musculoskeletal: Ambulates w/o diff, FROM * 4 ext.  Vasc: less 2 sec cap RF, warm and pink  Psych:  No HI/SI, judgement and insight good, Euthymic mood. Full Affect.    Recent Results (from the past 2160 hour(s))  POCT UA - Microalbumin     Status: Abnormal   Collection Time: 12/07/16  5:14 PM  Result Value Ref Range   Microalbumin Ur, POC 80 mg/L   Creatinine, POC 200 mg/dL   Albumin/Creatinine Ratio, Urine, POC 30-300

## 2016-12-07 NOTE — Assessment & Plan Note (Signed)
Been many yrs since looked at and machine falling apart.  - Dr Halford Chessman- has appt this Friday with him--> told pt to ask Dr Halford Chessman about new machine

## 2016-12-07 NOTE — Patient Instructions (Addendum)
-Please do not forget to talk to your pulmonologist about your obstructive sleep apnea and possibly getting new evaluation\new machine  -Please make a follow-up with the sports medicine Dr. Barbaraann Barthel for the pain in your back and hip.  You have seen him in the past and he said the follow-up and you were lost to follow-up.  Please ask him if he recommend you go up on Neurontin or what.  If this appointment is going to be too far off for you, call me back and I will refill your Neurontin at a slightly higher dose to see if we can get your little better pain relief prior to going to see the specialist     Please realize, EXERCISE IS MEDICINE!  -  American Heart Association [facility]) guidelines for exercise : If you are in good health, without any medical conditions, you should engage in 150 minutes of moderate intensity aerobic activity per week.  This means you should be huffing and puffing throughout your workout.   Engaging in regular exercise will improve brain function and memory, as well as improve mood, boost immune system and help with weight management.  As well as the other, more well-known effects of exercise such as decreasing blood sugar levels, decreasing blood pressure,  and decreasing bad cholesterol levels/ increasing good cholesterol levels.     -  The AHA strongly endorses consumption of a diet that contains a variety of foods from all the food categories with an emphasis on fruits and vegetables; fat-free and low-fat dairy products; cereal and grain products; legumes and nuts; and fish, poultry, and/or extra lean meats.    Excessive food intake, especially of foods high in saturated and trans fats, sugar, and salt, should be avoided.    Adequate water intake of roughly 1/2 of your weight in pounds, should equal the ounces of water per day you should drink.  So for instance, if you're 200 pounds, that would be 100 ounces of water per day.          Mediterranean Diet  Why follow it?  Research shows. . Those who follow the Mediterranean diet have a reduced risk of heart disease  . The diet is associated with a reduced incidence of Parkinson's and Alzheimer's diseases . People following the diet may have longer life expectancies and lower rates of chronic diseases  . The Dietary Guidelines for Americans recommends the Mediterranean diet as an eating plan to promote health and prevent disease  What Is the Mediterranean Diet?  . Healthy eating plan based on typical foods and recipes of Mediterranean-style cooking . The diet is primarily a plant based diet; these foods should make up a majority of meals   Starches - Plant based foods should make up a majority of meals - They are an important sources of vitamins, minerals, energy, antioxidants, and fiber - Choose whole grains, foods high in fiber and minimally processed items  - Typical grain sources include wheat, oats, barley, corn, brown rice, bulgar, farro, millet, polenta, couscous  - Various types of beans include chickpeas, lentils, fava beans, black beans, white beans   Fruits  Veggies - Large quantities of antioxidant rich fruits & veggies; 6 or more servings  - Vegetables can be eaten raw or lightly drizzled with oil and cooked  - Vegetables common to the traditional Mediterranean Diet include: artichokes, arugula, beets, broccoli, brussel sprouts, cabbage, carrots, celery, collard greens, cucumbers, eggplant, kale, leeks, lemons, lettuce, mushrooms, okra, onions, peas, peppers, potatoes,  pumpkin, radishes, rutabaga, shallots, spinach, sweet potatoes, turnips, zucchini - Fruits common to the Mediterranean Diet include: apples, apricots, avocados, cherries, clementines, dates, figs, grapefruits, grapes, melons, nectarines, oranges, peaches, pears, pomegranates, strawberries, tangerines  Fats - Replace butter and margarine with healthy oils, such as olive oil, canola oil, and tahini  - Limit nuts to no more than a handful a  day  - Nuts include walnuts, almonds, pecans, pistachios, pine nuts  - Limit or avoid candied, honey roasted or heavily salted nuts - Olives are central to the [employer] - can be eaten whole or used in a variety of dishes   Meats Protein - Limiting red meat: no more than a few times a month - When eating red meat: choose lean cuts and keep the portion to the size of deck of cards - Eggs: approx. 0 to 4 times a week  - Fish and lean poultry: at least 2 a week  - Healthy protein sources include, chicken, [place], lean beef, lamb - Increase intake of seafood such as tuna, salmon, trout, mackerel, shrimp, scallops - Avoid or limit high fat processed meats such as sausage and bacon  Dairy - Include moderate amounts of low fat dairy products  - Focus on healthy dairy such as fat free yogurt, skim milk, low or reduced fat cheese - Limit dairy products higher in fat such as whole or 2% milk, cheese, ice cream  Alcohol - Moderate amounts of red wine is ok  - No more than 5 oz daily for women (all ages) and men older than age 74  - No more than 10 oz of wine daily for men younger than 3  Other - Limit sweets and other desserts  - Use herbs and spices instead of salt to flavor foods  - Herbs and spices common to the traditional Mediterranean Diet include: basil, bay leaves, chives, cloves, cumin, fennel, garlic, lavender, marjoram, mint, oregano, parsley, pepper, rosemary, sage, savory, sumac, tarragon, thyme   It's not just a diet, it's a lifestyle:  . The Mediterranean diet includes lifestyle factors typical of those in the region  . Foods, drinks and meals are best eaten with others and savored . Daily physical activity is important for overall good health . This could be strenuous exercise like running and aerobics . This could also be more leisurely activities such as walking, housework, yard-work, or taking the stairs . Moderation is the key; a balanced and healthy diet accommodates  most foods and drinks . Consider portion sizes and frequency of consumption of certain foods   Meal Ideas & Options:  . Breakfast:  o Whole wheat toast or whole wheat English muffins with peanut butter & hard boiled egg o Steel cut oats topped with apples & cinnamon and skim milk  o Fresh fruit: banana, strawberries, melon, berries, peaches  o Smoothies: strawberries, bananas, greek yogurt, peanut butter o Low fat greek yogurt with blueberries and granola  o Egg white omelet with spinach and mushrooms o Breakfast couscous: whole wheat couscous, apricots, skim milk, cranberries  . Sandwiches:  o Hummus and grilled vegetables (peppers, zucchini, squash) on whole wheat bread   o Grilled chicken on whole wheat pita with lettuce, tomatoes, cucumbers or tzatziki  o Tuna salad on whole wheat bread: tuna salad made with greek yogurt, olives, red peppers, capers, green onions o Garlic rosemary lamb pita: lamb sauted with garlic, rosemary, salt & pepper; add lettuce, cucumber, greek yogurt to pita - flavor with lemon juice  and black pepper  . Seafood:  o Mediterranean grilled salmon, seasoned with garlic, basil, parsley, lemon juice and black pepper o Shrimp, lemon, and spinach whole-grain pasta salad made with low fat greek yogurt  o Seared scallops with lemon orzo  o Seared tuna steaks seasoned salt, pepper, coriander topped with tomato mixture of olives, tomatoes, olive oil, minced garlic, parsley, green onions and cappers  . Meats:  o Herbed greek chicken salad with kalamata olives, cucumber, feta  o Red bell peppers stuffed with spinach, bulgur, lean ground beef (or lentils) & topped with feta   o Kebabs: skewers of chicken, tomatoes, onions, zucchini, squash  o [place] burgers: made with red onions, mint, dill, lemon juice, feta cheese topped with roasted red peppers . Vegetarian o Cucumber salad: cucumbers, artichoke hearts, celery, red onion, feta cheese, tossed in olive oil & lemon juice   o Hummus and whole grain pita points with a greek salad (lettuce, tomato, feta, olives, cucumbers, red onion) o Lentil soup with celery, carrots made with vegetable broth, garlic, salt and pepper  o Tabouli salad: parsley, bulgur, mint, scallions, cucumbers, tomato, radishes, lemon juice, olive oil, salt and pepper.

## 2016-12-10 ENCOUNTER — Encounter: Payer: Self-pay | Admitting: Family Medicine

## 2016-12-10 ENCOUNTER — Ambulatory Visit (INDEPENDENT_AMBULATORY_CARE_PROVIDER_SITE_OTHER): Payer: PPO | Admitting: Family Medicine

## 2016-12-10 DIAGNOSIS — M5442 Lumbago with sciatica, left side: Secondary | ICD-10-CM | POA: Diagnosis not present

## 2016-12-10 DIAGNOSIS — G8929 Other chronic pain: Secondary | ICD-10-CM | POA: Diagnosis not present

## 2016-12-10 MED ORDER — GABAPENTIN 300 MG PO CAPS: 300.0000 mg | ORAL_CAPSULE | Freq: Three times a day (TID) | ORAL | 1 refills | Status: DC

## 2016-12-10 NOTE — Patient Instructions (Signed)
Your pain is due to arthritis of your back causing radiculopathy (pinched nerve into your leg). Take tylenol for baseline pain relief (1-2 extra strength tabs 3x/day) Increase your gabapentin to 300mg  three times a day. Ibuprofen 600mg  three times a day with food for pain and inflammation as needed. Consider prednisone dose pack, muscle relaxant  Physical therapy has been shown to be helpful as well - start this. Strengthening of low back muscles, abdominal musculature are key for long term pain relief. If not improving, will consider further imaging (MRI), increasing your gabapentin. Follow up with me in 1 month.

## 2016-12-11 ENCOUNTER — Encounter: Payer: Self-pay | Admitting: Pulmonary Disease

## 2016-12-11 ENCOUNTER — Ambulatory Visit: Payer: PPO | Admitting: Pulmonary Disease

## 2016-12-11 VITALS — BP 158/90 | HR 63 | Ht 72.0 in | Wt 250.0 lb

## 2016-12-11 DIAGNOSIS — G4733 Obstructive sleep apnea (adult) (pediatric): Secondary | ICD-10-CM | POA: Diagnosis not present

## 2016-12-11 DIAGNOSIS — Z9989 Dependence on other enabling machines and devices: Secondary | ICD-10-CM | POA: Diagnosis not present

## 2016-12-11 NOTE — Patient Instructions (Signed)
Will arrange for new CPAP machine  Follow up in 3 months 

## 2016-12-11 NOTE — Progress Notes (Signed)
Current Outpatient Medications on File Prior to Visit  Medication Sig  . allopurinol (ZYLOPRIM) 300 MG tablet TAKE 1/2 TABLET BY MOUTH DAILY  . amLODipine (NORVASC) 10 MG tablet TAKE 1/2 TABLET BY MOUTH THREE TIMES PER WEEK  . Ascorbic Acid (VITAMIN C) 1000 MG tablet Take 1,000 mg by mouth daily.  Marland Kitchen aspirin 81 MG tablet Take 81 mg by mouth daily.    Marland Kitchen atorvastatin (LIPITOR) 20 MG tablet TAKE 1 TABLET BY MOUTH DAILY  . benazepril (LOTENSIN) 20 MG tablet TAKE 1 TABLET BY MOUTH DAILY  . FLUoxetine (PROZAC) 40 MG capsule TAKE 1 CAPSULE BY MOUTH DAILY  . fluticasone (FLONASE) 50 MCG/ACT nasal spray Place 2 sprays into both nostrils daily.  Marland Kitchen gabapentin (NEURONTIN) 300 MG capsule Take 1 capsule (300 mg total) by mouth 3 (three) times daily.  . Multiple Vitamins-Iron (MULTIVITAMINS WITH IRON) TABS Take 1 tablet by mouth daily.  Marland Kitchen MYRBETRIQ 25 MG TB24 tablet   . pantoprazole (PROTONIX) 40 MG tablet TAKE 1 TABLET BY MOUTH DAILY  . polyethylene glycol powder (GLYCOLAX/MIRALAX) powder USE AS DIRECTED  . pyridoxine (B-6) 100 MG tablet Take 100 mg by mouth daily.  . traZODone (DESYREL) 50 MG tablet TAKE 1/2 TO 1 TABLET BY MOUTH EVERY NIGHT AT BEDTIME AS NEEDED FOR SLEEP   No current facility-administered medications on file prior to visit.      Chief Complaint  Patient presents with  . Follow-up    Hasnt been seen in several years. Needs new supplies for CPAP-old set up. Re-establishing with you. Still feels tired when he wakes up. Wife states he is still having apneic episodes at night.      Sleep tests PSG 03/08/03 >> AHI 33  Past medical history DM, GERD, Gout, HLD, HTN  Past surgical history, Family history, Social history, Allergies all reviewed.  Vital Signs BP (!) 158/90 (BP Location: Left Arm, Cuff Size: Large)   Pulse 63   Ht 6' (1.829 m)   Wt 250 lb (113.4 kg)   SpO2 95%   BMI 33.91 kg/m   History of Present Illness [name] is a 61 y.o. male with obstructive sleep  apnea.  I last saw him in 2009.  He had sleep study in 2005.  This showed severe sleep apnea.  He has been using CPAP.  He needs new supplies.  He doesn't think his machine is working.  He doesn't feel the pressure like before.  His wife says he is snoring and jerking while asleep.  Physical Exam  General - No distress Eyes - pupils reactive, wears glasses ENT - No sinus tenderness, no oral exudate, no LAN, MP 3 Cardiac - s1s2 regular, no murmur Chest - No wheeze/rales/dullness Back - No focal tenderness Abd - Soft, non-tender Ext - No edema Neuro - Normal strength Skin - No rashes Psych - normal mood, and behavior   Assessment/Plan  Obstructive sleep apnea. - he reports compliance with CPAP and benefit from therapy - he needs new machine; his machine is more than 62 yrs old - will arrange for auto CPAP and new supplies - he might need new sleep study for insurance purposes prior to getting a new machine; should be able to do home sleep study   Patient Instructions  Will arrange for new CPAP machine  Follow up in 3 months   Chesley Mires, MD [facility] Pager:  [phone] 12/11/2016, 4:07 PM

## 2016-12-11 NOTE — Progress Notes (Signed)
   Subjective:    Patient ID: [name], male    DOB: 29-Apr-1955, 61 y.o.   MRN: [id number]  HPI    Review of Systems  HENT: Positive for congestion, ear pain, nosebleeds, postnasal drip, rhinorrhea, sinus pressure, sneezing, sore throat and trouble swallowing.   Respiratory: Positive for cough.   Allergic/Immunologic: Positive for environmental allergies.  Neurological: Positive for headaches.  Hematological: Bruises/bleeds easily.       Objective:   Physical Exam        Assessment & Plan:

## 2016-12-14 ENCOUNTER — Encounter: Payer: Self-pay | Admitting: Family Medicine

## 2016-12-14 NOTE — Assessment & Plan Note (Signed)
>>  ASSESSMENT AND PLAN FOR CHRONIC LOW BACK PAIN- W LUMBAR RADICULOPATHY WRITTEN ON 12/14/2016  7:11 PM BY HUDNALL, SHANE R, MD  consistent with radiculopathy - he has known advanced arthritis low back, already s/p 2 surgeries.  He is on a very low dose of gabapentin - will increase to 300mg  tid.  Start physical therapy and home exercises.  Ibuprofen if needed.  Consider prednisone dose pack, muscle relaxant.  F/u in 1 month.

## 2016-12-14 NOTE — Progress Notes (Signed)
PCP: Mellody Dance, DO  Subjective:   HPI: Patient is a 61 y.o. male here for low back pain.  Patient reports having had two separate back surgeries and noted only about 3 months of benefit beyond these. Current pain is worse the past 3  Months without new injury or trauma. Pain level 4/10 but up to 10/10 and sharp at times. Feels on left side though can radiate down to left knee with numbness. Worse with standing. Takes ibuprofen. Was on lyrica but now on gabapentin. Also had injection before which helped some. No bowel/bladder dysfunction. + night pain and difficulty getting comfortable.  Past Medical History:  Diagnosis Date  . Carpal tunnel syndrome, bilateral   . Carpal tunnel syndrome, right    nerve impingement right arm/wears brace  . Diabetes mellitus    no meds  . GERD (gastroesophageal reflux disease)   . Gout   . Hepatic steatosis   . Hyperlipidemia   . Hypertension   . Obstructive sleep apnea     Current Outpatient Medications on File Prior to Visit  Medication Sig Dispense Refill  . allopurinol (ZYLOPRIM) 300 MG tablet TAKE 1/2 TABLET BY MOUTH DAILY 45 tablet 3  . amLODipine (NORVASC) 10 MG tablet TAKE 1/2 TABLET BY MOUTH THREE TIMES PER WEEK 18 tablet 6  . Ascorbic Acid (VITAMIN C) 1000 MG tablet Take 1,000 mg by mouth daily.    Marland Kitchen aspirin 81 MG tablet Take 81 mg by mouth daily.      Marland Kitchen atorvastatin (LIPITOR) 20 MG tablet TAKE 1 TABLET BY MOUTH DAILY 90 tablet 1  . benazepril (LOTENSIN) 20 MG tablet TAKE 1 TABLET BY MOUTH DAILY 90 tablet 1  . FLUoxetine (PROZAC) 40 MG capsule TAKE 1 CAPSULE BY MOUTH DAILY 90 capsule 1  . fluticasone (FLONASE) 50 MCG/ACT nasal spray Place 2 sprays into both nostrils daily. 16 g 6  . Multiple Vitamins-Iron (MULTIVITAMINS WITH IRON) TABS Take 1 tablet by mouth daily. 30 tablet 0  . MYRBETRIQ 25 MG TB24 tablet     . pantoprazole (PROTONIX) 40 MG tablet TAKE 1 TABLET BY MOUTH DAILY 90 tablet 1  . polyethylene glycol powder  (GLYCOLAX/MIRALAX) powder USE AS DIRECTED 850 g 11  . pyridoxine (B-6) 100 MG tablet Take 100 mg by mouth daily.    . traZODone (DESYREL) 50 MG tablet TAKE 1/2 TO 1 TABLET BY MOUTH EVERY NIGHT AT BEDTIME AS NEEDED FOR SLEEP 30 tablet 6   No current facility-administered medications on file prior to visit.     Past Surgical History:  Procedure Laterality Date  . CARPAL TUNNEL RELEASE  09/10/2015   RIGHT WRIST / WITH NERVE IMPINGEMENT SURGERY  . CERVICAL FUSION     Dr Sherwood Gambler  . COLONOSCOPY  2007   negative; [facility]  . ESOPHAGEAL DILATION  2007  . 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 LUMBAR FUSION      Dr Sherwood Gambler    Allergies  Allergen Reactions  . Prednisone Other (See Comments)    Caused blood sugar to increase, pt will not take    Social History   Socioeconomic History  . Marital status: Married    Spouse name: Not on file  . Number of children: Not on file  . Years of education: Not on file  . Highest education level: Not on file  Social Needs  . Financial resource strain: Not on file  . Food insecurity - worry: Not on file  .  Food insecurity - inability: Not on file  . Transportation needs - medical: Not on file  . Transportation needs - non-medical: Not on file  Occupational History  . Not on file  Tobacco Use  . Smoking status: Former Smoker    Last attempt to quit: 01/06/1976    Years since quitting: 40.9  . Smokeless tobacco: Never Used  . Tobacco comment: smoked 35 years ago as of 2013   Substance and Sexual Activity  . Alcohol use: No  . Drug use: No  . Sexual activity: Yes    Birth control/protection: None  Other Topics Concern  . Not on file  Social History Narrative  . Not on file    Family History  Problem Relation Age of Onset  . Heart disease Father        pacer  . Urolithiasis Father   . Heart attack Father   . Aneurysm Mother 23       CNS aneurysm  . Sudden death Mother   . Diabetes Sister    . Cancer Maternal Uncle        ? primary  . Cancer Paternal Uncle        ? primary  . Kidney disease Brother   . Heart disease Sister   . Heart disease Sister   . Hyperlipidemia Neg Hx   . Hypertension Neg Hx   . Colon cancer Neg Hx     BP 139/82   Pulse 65   Ht 6' (1.829 m)   Wt 247 lb (112 kg)   BMI 33.50 kg/m   Review of Systems: See HPI above.     Objective:  Physical Exam:  Gen: NAD, comfortable in exam room  Back: No gross deformity, scoliosis. No TTP .  No midline or bony TTP. FROM with pain on flexion. Strength LEs 5/5 all muscle groups.   2+ MSRs in patellar and achilles tendons, equal bilaterally. Negative SLRs. Sensation intact to light touch bilaterally.  Bilateral hips: No gross deformity. No TTP. FROM with 5/5 strength. Negative logroll bilateral hips Negative fabers and piriformis stretches. NVI distally.  Assessment & Plan:  1. Low back pain with radiation into left leg - consistent with radiculopathy - he has known advanced arthritis low back, already s/p 2 surgeries.  He is on a very low dose of gabapentin - will increase to 300mg  tid.  Start physical therapy and home exercises.  Ibuprofen if needed.  Consider prednisone dose pack, muscle relaxant.  F/u in 1 month.

## 2016-12-14 NOTE — Assessment & Plan Note (Signed)
consistent with radiculopathy - he has known advanced arthritis low back, already s/p 2 surgeries.  He is on a very low dose of gabapentin - will increase to 300mg  tid.  Start physical therapy and home exercises.  Ibuprofen if needed.  Consider prednisone dose pack, muscle relaxant.  F/u in 1 month.

## 2016-12-15 ENCOUNTER — Ambulatory Visit (INDEPENDENT_AMBULATORY_CARE_PROVIDER_SITE_OTHER): Payer: PPO | Admitting: Family Medicine

## 2016-12-15 ENCOUNTER — Ambulatory Visit: Payer: PPO | Admitting: Adult Health

## 2016-12-15 ENCOUNTER — Encounter: Payer: Self-pay | Admitting: Family Medicine

## 2016-12-15 VITALS — BP 144/88 | HR 77 | Temp 97.7°F | Ht 72.0 in | Wt 250.0 lb

## 2016-12-15 DIAGNOSIS — H7212 Attic perforation of tympanic membrane, left ear: Secondary | ICD-10-CM | POA: Diagnosis not present

## 2016-12-15 DIAGNOSIS — H6692 Otitis media, unspecified, left ear: Secondary | ICD-10-CM | POA: Diagnosis not present

## 2016-12-15 DIAGNOSIS — J019 Acute sinusitis, unspecified: Secondary | ICD-10-CM

## 2016-12-15 MED ORDER — AMOXICILLIN 500 MG PO CAPS: 500.0000 mg | ORAL_CAPSULE | Freq: Three times a day (TID) | ORAL | 0 refills | Status: DC

## 2016-12-15 NOTE — Patient Instructions (Signed)
Eardrum Rupture, Adult  An eardrum rupture is a hole (perforation) in the eardrum. The eardrum is a thin, round tissue inside of the ear that separates the ear canal from the middle ear. The eardrum is also called the tympanic membrane. It transfers sound vibrations through small bones in the middle ear to the hearing nerve in the inner ear. It also protects the middle ear from germs. An eardrum rupture can cause pain and hearing loss.  What are the causes?  This condition may be caused by:  · An infection.  · A sudden injury, such as from:  ? Inserting a thin, sharp object into the ear.  ? A hit to the side of the head, especially by an open hand.  ? Falling onto water or a flat surface.  ? A rapid change in pressure, such as from flying or scuba diving.  ? A sudden increase in pressure against the eardrum, such as from an explosion or a very loud noise.  · Inserting a cotton-tipped swab in the ear.  · A long-term eustachian tube disorder. Eustachian tubes are parts of the body that connect each middle ear space to the back of the nose.  · A medical procedure or surgery, such as a procedure to remove wax from the ear canal.  · Removing a man-made pressure equalization tube(PE tube) that was placed through the eardrum.  · Having a PE tube fall out.    What increases the risk?  You are more likely to develop this condition if:  · You have had PE tubes inserted in your ears.  · You have an ear infection.  · You play sports that:  ? Involve balls or contact with other players.  ? Take place in water, such as diving, scuba diving, or waterskiing.    What are the signs or symptoms?  Symptoms of this condition include:  · Sudden pain at the time of the injury.  · Ear pain that suddenly improves.  · Ringing in the ear after the injury.  · Drainage from the ear. The drainage may be clear, cloudy or pus-like, or bloody.  · Hearing loss.  · Dizziness.    How is this diagnosed?  This condition is diagnosed based on your  symptoms and medical history as well as a physical exam. Your health care provider can usually see a perforation using an ear scope (otoscope). You may have tests, such as:  · A hearing test (audiogram) to check for hearing loss.  · A test in which a sample of ear drainage is tested for infection (culture).    How is this treated?  An eardrum typically heals on its own within a few weeks. If your eardrum does not heal, your health care provider may recommend a procedure to place a patch over your eardrum or surgery to repair your eardrum. Your health care provider may also prescribe antibiotic medicines to help prevent infection.  If the ear heals completely, any hearing loss should be temporary.  Follow these instructions at home:  · Keep your ear dry. This is very important. Follow instructions from your health care provider about how to keep your ear dry. You may need to wear waterproof earplugs when bathing and swimming.  · Take over-the-counter and prescription medicines only as told by your health care provider.  · Return to sports and activities as told by your health care provider. Ask your health care provider what activities are safe for you.  ·   Wear headgear with ear protection when you play sports in which ear injuries are common.  · If directed, apply heat to your affected ear as often as told by your health care provider. Use the heat source that your health care provider recommends, such as a moist heat pack or a heating pad. This will help to relieve pain.  ? Place a towel between your skin and the heat source.  ? Leave the heat on for 20-30 minutes.  ? Remove the heat if your skin turns bright red. This is especially important if you are unable to feel pain, heat, or cold. You may have a greater risk of getting burned.  · Keep all follow-up visits as told by your health care provider. This is important.  · Talk to your health care provider before traveling by plane.  Contact a health care provider  if:  · You have mucus or blood draining from your ear.  · You have a fever.  · You have ear pain.  · You have hearing loss, dizziness, or ringing in your ear.  Get help right away if:  · You have sudden hearing loss.  · You are very dizzy.  · You have severe ear pain.  · Your face feels weak or becomes paralyzed.  Summary  · An eardrum rupture is a hole (perforation) in the eardrum that can cause pain and hearing loss. It is usually caused by a sudden injury to the ear.  · The eardrum will likely heal on its own within a few weeks. In some cases, surgery may be necessary.  · After the injury, follow instructions from your health care provider about how to keep your ear dry as it heals.  This information is not intended to replace advice given to you by your health care provider. Make sure you discuss any questions you have with your health care provider.  Document Released: 12/20/1999 Document Revised: 02/28/2016 Document Reviewed: 02/28/2016  Elsevier Interactive Patient Education © 2018 [employer]

## 2016-12-15 NOTE — Progress Notes (Signed)
Acute Care Office visit  Assessment and plan:  1. Tympanic membrane attic perforation, left   2. Left otitis media, unspecified otitis media type   3. Acute sinusitis, recurrence not specified, unspecified location    - ABX given to pt; supportive care with HBP OTC Sinus meds d/c pt; reck BP at home and bring in log if not well controlled after getting over this illness   Education and routine counseling performed. Handouts provided.  Anticipatory guidance and routine counseling done re: condition, txmnt options and need for follow up. All questions of patient's were answered.  Gross side effects, risk and benefits, and alternatives of medications discussed with patient.  Patient is aware that all medications have potential side effects and we are unable to predict every sideeffect or drug-drug interaction that may occur.  Expresses verbal understanding and consents to current therapy plan and treatment regiment.  Return if symptoms worsen or fail to improve, for Follow-up in near future to address additional concerns as needed.  Please see AVS handed out to patient at the end of our visit for additional patient instructions/ counseling done pertaining to today's office visit.  Note: This document was prepared using Dragon voice recognition software and may include unintentional dictation errors.    Subjective:    Chief Complaint  Patient presents with  . Ear Pain    HPI:  Pt presents with L ear pain which started 3 days ago when he awoke in the early AM with severe pain L ear and blood on his pillow.  He has had intense pain over the whole left side of his face as well.  No F/C, history of PE tubes in his ears and now has significant pain just on the left side   Denies:  objective F/C, No N/V/D,   No SOB/DIB, no Cough or  Pleuritic CP,  No Rash.    For symptoms patient has tried: Over-the-counter sinus medications which is likely caused his blood pressure increase    Overall getting:   No better no worse  Allergies  Allergen Reactions  . Prednisone Other (See Comments)    Caused blood sugar to increase, pt will not take   Past medical history, Surgical history, Family history reviewed and noted below, Social history, Allergies, and Medications have been entered into the medical record, reviewed and changed as needed.   Allergies  Allergen Reactions  . Prednisone Other (See Comments)    Caused blood sugar to increase, pt will not take    Review of Systems: General:   No F/C, wt loss Pulm:   No DIB, pleuritic chest pain Card:  No CP, palpitations Abd:  No n/v/d or pain Ext:  No inc edema from baseline   Objective:   Blood pressure (!) 157/91, pulse 77, temperature 97.7 F (36.5 C), height 6' (1.829 m), weight 250 lb (113.4 kg), SpO2 98 %. Body mass index is 33.91 kg/m. General: Well Developed, well nourished, appropriate for stated age.  Neuro: Alert and oriented x3, extra-ocular muscles intact, sensation grossly intact.  HEENT: Normocephalic, atraumatic, pupils equal round reactive to light, neck supple, no masses, no painful lymphadenopathy, TM's on right with blue PE tube in place.  No acute abnormality, left TM obscured by dried blood in anterior inferior aspect of ear canal.  His visual appears to be opaque and bulging, Nares- patent, clear d/c, OP- clear, mild erythema, + TTP sinus-left maxillary Skin: Warm and dry, no gross rash. Vascular:  No gross lower  ext edema, cap RF less 2 sec. Psych: No HI/SI, judgement and insight good, Euthymic mood. Full Affect.   Patient Care Team    Relationship Specialty Notifications Start End  Mellody Dance, DO PCP - General Family Medicine  10/28/16   Rutherford Guys, MD Consulting Physician Ophthalmology  11/23/14   Jovita Gamma, MD Consulting Physician Neurosurgery  11/23/14   Kathie Rhodes, MD Consulting Physician Urology  12/07/14   Milus Banister, MD Attending Physician  Gastroenterology  12/07/14   Chesley Mires, MD Consulting Physician Pulmonary Disease  12/07/16   Garald Balding, MD Consulting Physician Orthopedic Surgery  12/07/16    Comment: back and hip  Dene Gentry, MD Consulting Physician Sports Medicine  12/07/16    Comment: back and hip pain

## 2016-12-22 DIAGNOSIS — G4733 Obstructive sleep apnea (adult) (pediatric): Secondary | ICD-10-CM | POA: Diagnosis not present

## 2017-01-04 ENCOUNTER — Other Ambulatory Visit: Payer: Self-pay | Admitting: Family Medicine

## 2017-01-04 NOTE — Telephone Encounter (Signed)
Last filled by a previous provider please review. LOV 12/15/2016, patient is a new patient with you.  MPulliam, CMA/RT(R)

## 2017-01-06 DIAGNOSIS — H903 Sensorineural hearing loss, bilateral: Secondary | ICD-10-CM | POA: Diagnosis not present

## 2017-01-06 DIAGNOSIS — H9222 Otorrhagia, left ear: Secondary | ICD-10-CM | POA: Diagnosis not present

## 2017-01-06 DIAGNOSIS — H6983 Other specified disorders of Eustachian tube, bilateral: Secondary | ICD-10-CM | POA: Diagnosis not present

## 2017-01-06 DIAGNOSIS — Z9622 Myringotomy tube(s) status: Secondary | ICD-10-CM | POA: Diagnosis not present

## 2017-01-08 ENCOUNTER — Other Ambulatory Visit: Payer: PPO

## 2017-01-08 DIAGNOSIS — E1122 Type 2 diabetes mellitus with diabetic chronic kidney disease: Secondary | ICD-10-CM

## 2017-01-08 DIAGNOSIS — I10 Essential (primary) hypertension: Principal | ICD-10-CM

## 2017-01-08 DIAGNOSIS — E1169 Type 2 diabetes mellitus with other specified complication: Secondary | ICD-10-CM

## 2017-01-08 DIAGNOSIS — I152 Hypertension secondary to endocrine disorders: Secondary | ICD-10-CM

## 2017-01-08 DIAGNOSIS — E1159 Type 2 diabetes mellitus with other circulatory complications: Secondary | ICD-10-CM

## 2017-01-08 DIAGNOSIS — E785 Hyperlipidemia, unspecified: Secondary | ICD-10-CM | POA: Diagnosis not present

## 2017-01-08 DIAGNOSIS — E559 Vitamin D deficiency, unspecified: Secondary | ICD-10-CM | POA: Diagnosis not present

## 2017-01-09 LAB — CBC WITH DIFFERENTIAL/PLATELET
Basophils Absolute: 0 10*3/uL (ref 0.0–0.2)
Basos: 1 %
EOS (ABSOLUTE): 0.2 10*3/uL (ref 0.0–0.4)
Eos: 3 %
Hematocrit: 38.6 % (ref 37.5–51.0)
Hemoglobin: 12.4 g/dL — ABNORMAL LOW (ref 13.0–17.7)
Immature Grans (Abs): 0 10*3/uL (ref 0.0–0.1)
Immature Granulocytes: 0 %
Lymphocytes Absolute: 1 10*3/uL (ref 0.7–3.1)
Lymphs: 20 %
MCH: 26.8 pg (ref 26.6–33.0)
MCHC: 32.1 g/dL (ref 31.5–35.7)
MCV: 83 fL (ref 79–97)
Monocytes Absolute: 0.5 10*3/uL (ref 0.1–0.9)
Monocytes: 9 %
Neutrophils Absolute: 3.4 10*3/uL (ref 1.4–7.0)
Neutrophils: 67 %
Platelets: 250 10*3/uL (ref 150–379)
RBC: 4.63 x10E6/uL (ref 4.14–5.80)
RDW: 14 % (ref 12.3–15.4)
WBC: 5.1 10*3/uL (ref 3.4–10.8)

## 2017-01-09 LAB — COMPREHENSIVE METABOLIC PANEL
ALT: 27 IU/L (ref 0–44)
AST: 26 IU/L (ref 0–40)
Albumin/Globulin Ratio: 1.4 (ref 1.2–2.2)
Albumin: 4.4 g/dL (ref 3.6–4.8)
Alkaline Phosphatase: 106 IU/L (ref 39–117)
BUN/Creatinine Ratio: 12 (ref 10–24)
BUN: 18 mg/dL (ref 8–27)
Bilirubin Total: 0.4 mg/dL (ref 0.0–1.2)
CO2: 21 mmol/L (ref 20–29)
Calcium: 9.5 mg/dL (ref 8.6–10.2)
Chloride: 105 mmol/L (ref 96–106)
Creatinine, Ser: 1.5 mg/dL — ABNORMAL HIGH (ref 0.76–1.27)
GFR calc Af Amer: 57 mL/min/{1.73_m2} — ABNORMAL LOW (ref >59)
GFR calc non Af Amer: 49 mL/min/{1.73_m2} — ABNORMAL LOW (ref >59)
Globulin, Total: 3.2 g/dL (ref 1.5–4.5)
Glucose: 243 mg/dL — ABNORMAL HIGH (ref 65–99)
Potassium: 4.8 mmol/L (ref 3.5–5.2)
Sodium: 142 mmol/L (ref 134–144)
Total Protein: 7.6 g/dL (ref 6.0–8.5)

## 2017-01-09 LAB — VITAMIN D 25 HYDROXY (VIT D DEFICIENCY, FRACTURES): Vit D, 25-Hydroxy: 26.6 ng/mL — ABNORMAL LOW (ref 30.0–100.0)

## 2017-01-09 LAB — HEMOGLOBIN A1C
Est. average glucose Bld gHb Est-mCnc: 206 mg/dL
Hgb A1c MFr Bld: 8.8 % — ABNORMAL HIGH (ref 4.8–5.6)

## 2017-01-09 LAB — T4, FREE: Free T4: 1.05 ng/dL (ref 0.82–1.77)

## 2017-01-09 LAB — LIPID PANEL
Chol/HDL Ratio: 4 ratio (ref 0.0–5.0)
Cholesterol, Total: 170 mg/dL (ref 100–199)
HDL: 42 mg/dL (ref >39)
LDL Calculated: 110 mg/dL — ABNORMAL HIGH (ref 0–99)
Triglycerides: 91 mg/dL (ref 0–149)
VLDL Cholesterol Cal: 18 mg/dL (ref 5–40)

## 2017-01-09 LAB — TSH: TSH: 2.95 u[IU]/mL (ref 0.450–4.500)

## 2017-01-11 ENCOUNTER — Encounter: Payer: Self-pay | Admitting: Family Medicine

## 2017-01-11 ENCOUNTER — Ambulatory Visit: Payer: PPO | Admitting: Family Medicine

## 2017-01-11 VITALS — BP 131/88 | HR 74 | Ht 72.0 in | Wt 252.4 lb

## 2017-01-11 DIAGNOSIS — M5442 Lumbago with sciatica, left side: Secondary | ICD-10-CM | POA: Diagnosis not present

## 2017-01-11 DIAGNOSIS — G8929 Other chronic pain: Secondary | ICD-10-CM | POA: Diagnosis not present

## 2017-01-11 NOTE — Patient Instructions (Signed)
Increase the gabapentin to 2 capsules three times a day - when you're close to running out of this give me a call and I'll send in the higher dose (600mg  capsules). We will refer you to physical therapy - call me if you haven't heard anything by Wednesday. Otherwise follow up with me in 1 month.

## 2017-01-12 ENCOUNTER — Other Ambulatory Visit: Payer: Self-pay | Admitting: Family Medicine

## 2017-01-12 ENCOUNTER — Ambulatory Visit (INDEPENDENT_AMBULATORY_CARE_PROVIDER_SITE_OTHER): Payer: PPO | Admitting: Family Medicine

## 2017-01-12 ENCOUNTER — Encounter: Payer: Self-pay | Admitting: Family Medicine

## 2017-01-12 ENCOUNTER — Telehealth: Payer: Self-pay | Admitting: Family Medicine

## 2017-01-12 VITALS — BP 123/79 | HR 70 | Ht 72.0 in | Wt 253.0 lb

## 2017-01-12 DIAGNOSIS — N289 Disorder of kidney and ureter, unspecified: Secondary | ICD-10-CM

## 2017-01-12 DIAGNOSIS — E1169 Type 2 diabetes mellitus with other specified complication: Secondary | ICD-10-CM

## 2017-01-12 DIAGNOSIS — E785 Hyperlipidemia, unspecified: Secondary | ICD-10-CM

## 2017-01-12 DIAGNOSIS — I152 Hypertension secondary to endocrine disorders: Secondary | ICD-10-CM

## 2017-01-12 DIAGNOSIS — E559 Vitamin D deficiency, unspecified: Secondary | ICD-10-CM

## 2017-01-12 DIAGNOSIS — G4733 Obstructive sleep apnea (adult) (pediatric): Secondary | ICD-10-CM

## 2017-01-12 DIAGNOSIS — E1122 Type 2 diabetes mellitus with diabetic chronic kidney disease: Secondary | ICD-10-CM

## 2017-01-12 DIAGNOSIS — I10 Essential (primary) hypertension: Secondary | ICD-10-CM | POA: Diagnosis not present

## 2017-01-12 DIAGNOSIS — E1159 Type 2 diabetes mellitus with other circulatory complications: Secondary | ICD-10-CM

## 2017-01-12 MED ORDER — VITAMIN D (ERGOCALCIFEROL) 1.25 MG (50000 UNIT) PO CAPS: 50000.0000 [IU] | ORAL_CAPSULE | ORAL | 10 refills | Status: DC

## 2017-01-12 MED ORDER — ATORVASTATIN CALCIUM 40 MG PO TABS: 40.0000 mg | ORAL_TABLET | Freq: Every day | ORAL | 2 refills | Status: DC

## 2017-01-12 NOTE — Patient Instructions (Addendum)
In 3 mo- since you decline meds today, if your A1c is not under 7.0  -> you agree to go on meds.   Goal is to be under 6.5 and well controlled due to your renal dysfunction.  Reck A1c, CMP, Vit D  Contact your med insurance and ask what is the preferred meter for DM      Please realize, EXERCISE IS MEDICINE!  -  American Heart Association [facility]) guidelines for exercise : If you are in good health, without any medical conditions, you should engage in 150 minutes of moderate intensity aerobic activity per week.  This means you should be huffing and puffing throughout your workout.   Engaging in regular exercise will improve brain function and memory, as well as improve mood, boost immune system and help with weight management.  As well as the other, more well-known effects of exercise such as decreasing blood sugar levels, decreasing blood pressure,  and decreasing bad cholesterol levels/ increasing good cholesterol levels.     -  The AHA strongly endorses consumption of a diet that contains a variety of foods from all the food categories with an emphasis on fruits and vegetables; fat-free and low-fat dairy products; cereal and grain products; legumes and nuts; and fish, poultry, and/or extra lean meats.    Excessive food intake, especially of foods high in saturated and trans fats, sugar, and salt, should be avoided.    Adequate water intake of roughly 1/2 of your weight in pounds, should equal the ounces of water per day you should drink.  So for instance, if you're 200 pounds, that would be 100 ounces of water per day.         Mediterranean Diet  Why follow it? Research shows. . Those who follow the Mediterranean diet have a reduced risk of heart disease  . The diet is associated with a reduced incidence of Parkinson's and Alzheimer's diseases . People following the diet may have longer life expectancies and lower rates of chronic diseases  . The Dietary Guidelines for Americans recommends  the Mediterranean diet as an eating plan to promote health and prevent disease  What Is the Mediterranean Diet?  . Healthy eating plan based on typical foods and recipes of Mediterranean-style cooking . The diet is primarily a plant based diet; these foods should make up a majority of meals   Starches - Plant based foods should make up a majority of meals - They are an important sources of vitamins, minerals, energy, antioxidants, and fiber - Choose whole grains, foods high in fiber and minimally processed items  - Typical grain sources include wheat, oats, barley, corn, brown rice, bulgar, farro, millet, polenta, couscous  - Various types of beans include chickpeas, lentils, fava beans, black beans, white beans   Fruits  Veggies - Large quantities of antioxidant rich fruits & veggies; 6 or more servings  - Vegetables can be eaten raw or lightly drizzled with oil and cooked  - Vegetables common to the traditional Mediterranean Diet include: artichokes, arugula, beets, broccoli, brussel sprouts, cabbage, carrots, celery, collard greens, cucumbers, eggplant, kale, leeks, lemons, lettuce, mushrooms, okra, onions, peas, peppers, potatoes, pumpkin, radishes, rutabaga, shallots, spinach, sweet potatoes, turnips, zucchini - Fruits common to the Mediterranean Diet include: apples, apricots, avocados, cherries, clementines, dates, figs, grapefruits, grapes, melons, nectarines, oranges, peaches, pears, pomegranates, strawberries, tangerines  Fats - Replace butter and margarine with healthy oils, such as olive oil, canola oil, and tahini  - Limit nuts to no  more than a handful a day  - Nuts include walnuts, almonds, pecans, pistachios, pine nuts  - Limit or avoid candied, honey roasted or heavily salted nuts - Olives are central to the Mediterranean diet - can be eaten whole or used in a variety of dishes   Meats Protein - Limiting red meat: no more than a few times a month - When eating red meat: choose  lean cuts and keep the portion to the size of deck of cards - Eggs: approx. 0 to 4 times a week  - Fish and lean poultry: at least 2 a week  - Healthy protein sources include, chicken, [place], lean beef, lamb - Increase intake of seafood such as tuna, salmon, trout, mackerel, shrimp, scallops - Avoid or limit high fat processed meats such as sausage and bacon  Dairy - Include moderate amounts of low fat dairy products  - Focus on healthy dairy such as fat free yogurt, skim milk, low or reduced fat cheese - Limit dairy products higher in fat such as whole or 2% milk, cheese, ice cream  Alcohol - Moderate amounts of red wine is ok  - No more than 5 oz daily for women (all ages) and men older than age 34  - No more than 10 oz of wine daily for men younger than 50  Other - Limit sweets and other desserts  - Use herbs and spices instead of salt to flavor foods  - Herbs and spices common to the traditional Mediterranean Diet include: basil, bay leaves, chives, cloves, cumin, fennel, garlic, lavender, marjoram, mint, oregano, parsley, pepper, rosemary, sage, savory, sumac, tarragon, thyme   It's not just a diet, it's a lifestyle:  . The Mediterranean diet includes lifestyle factors typical of those in the region  . Foods, drinks and meals are best eaten with others and savored . Daily physical activity is important for overall good health . This could be strenuous exercise like running and aerobics . This could also be more leisurely activities such as walking, housework, yard-work, or taking the stairs . Moderation is the key; a balanced and healthy diet accommodates most foods and drinks . Consider portion sizes and frequency of consumption of certain foods   Meal Ideas & Options:  . Breakfast:  o Whole wheat toast or whole wheat English muffins with peanut butter & hard boiled egg o Steel cut oats topped with apples & cinnamon and skim milk  o Fresh fruit: banana, strawberries, melon,  berries, peaches  o Smoothies: strawberries, bananas, greek yogurt, peanut butter o Low fat greek yogurt with blueberries and granola  o Egg white omelet with spinach and mushrooms o Breakfast couscous: whole wheat couscous, apricots, skim milk, cranberries  . Sandwiches:  o Hummus and grilled vegetables (peppers, zucchini, squash) on whole wheat bread   o Grilled chicken on whole wheat pita with lettuce, tomatoes, cucumbers or tzatziki  o Tuna salad on whole wheat bread: tuna salad made with greek yogurt, olives, red peppers, capers, green onions o Garlic rosemary lamb pita: lamb sauted with garlic, rosemary, salt & pepper; add lettuce, cucumber, greek yogurt to pita - flavor with lemon juice and black pepper  . Seafood:  o Mediterranean grilled salmon, seasoned with garlic, basil, parsley, lemon juice and black pepper o Shrimp, lemon, and spinach whole-grain pasta salad made with low fat greek yogurt  o Seared scallops with lemon orzo  o Seared tuna steaks seasoned salt, pepper, coriander topped with tomato mixture of olives, tomatoes,  olive oil, minced garlic, parsley, green onions and cappers  . Meats:  o Herbed greek chicken salad with kalamata olives, cucumber, feta  o Red bell peppers stuffed with spinach, bulgur, lean ground beef (or lentils) & topped with feta   o Kebabs: skewers of chicken, tomatoes, onions, zucchini, squash  o [place] burgers: made with red onions, mint, dill, lemon juice, feta cheese topped with roasted red peppers . Vegetarian o Cucumber salad: cucumbers, artichoke hearts, celery, red onion, feta cheese, tossed in olive oil & lemon juice  o Hummus and whole grain pita points with a greek salad (lettuce, tomato, feta, olives, cucumbers, red onion) o Lentil soup with celery, carrots made with vegetable broth, garlic, salt and pepper  o Tabouli salad: parsley, bulgur, mint, scallions, cucumbers, tomato, radishes, lemon juice, olive oil, salt and  pepper.    Diabetes Mellitus and Standards of Medical Care  Managing diabetes (diabetes mellitus) can be complicated. Your diabetes treatment may be managed by a team of health care providers, including:  A diet and nutrition specialist (registered dietitian).  A nurse.  A certified diabetes educator (CDE).  A diabetes specialist (endocrinologist).  An eye doctor.  A primary care provider.  A dentist.  Your health care providers follow a schedule in order to help you get the best quality of care. The following schedule is a general guideline for your diabetes management plan. Your health care providers may also give you more specific instructions.  HbA1c (hemoglobin A1c) test This test provides information about blood sugar (glucose) control over the previous 2-3 months. It is used to check whether your diabetes management plan needs to be adjusted.  If you are meeting your treatment goals, this test is done at least 2 times a year.  If you are not meeting treatment goals or if your treatment goals have changed, this test is done 4 times a year.  Blood pressure test  This test is done at every routine medical visit. For most people, the goal is less than 130/80. Ask your health care provider what your goal blood pressure should be.  Dental and eye exams  Visit your dentist two times a year.  If you have type 1 diabetes, get an eye exam 3-5 years after you are diagnosed, and then once a year after your first exam. ? If you were diagnosed with type 1 diabetes as a child, get an eye exam when you are age 71 or older and have had diabetes for 3-5 years. After the first exam, you should get an eye exam once a year.  If you have type 2 diabetes, have an eye exam as soon as you are diagnosed, and then once a year after your first exam.  Foot care exam  Visual foot exams are done at every routine medical visit. The exams check for cuts, bruises, redness, blisters, sores, or  other problems with the feet.  A complete foot exam is done by your health care provider once a year. This exam includes an inspection of the structure and skin of your feet, and a check of the pulses and sensation in your feet. ? Type 1 diabetes: Get your first exam 3-5 years after diagnosis. ? Type 2 diabetes: Get your first exam as soon as you are diagnosed.  Check your feet every day for cuts, bruises, redness, blisters, or sores. If you have any of these or other problems that are not healing, contact your health care provider.  Kidney function test (urine  microalbumin)  This test is done once a year. ? Type 1 diabetes: Get your first test 5 years after diagnosis. ? Type 2 diabetes: Get your first test as soon as you are diagnosed._  If you have chronic kidney disease (CKD), get a serum creatinine and estimated glomerular filtration rate (eGFR) test once a year.  Lipid profile (cholesterol, HDL, LDL, triglycerides)  This test should be done when you are diagnosed with diabetes, and every 5 years after the first test. If you are on medicines to lower your cholesterol, you may need to get this test done every year. ? The goal for LDL is less than 100 mg/dL (5.5 mmol/L). If you are at high risk, the goal is less than 70 mg/dL (3.9 mmol/L). ? The goal for HDL is 40 mg/dL (2.2 mmol/L) for men and 50 mg/dL(2.8 mmol/L) for women. An HDL cholesterol of 60 mg/dL (3.3 mmol/L) or higher gives some protection against heart disease. ? The goal for triglycerides is less than 150 mg/dL (8.3 mmol/L).  Immunizations  The yearly flu (influenza) vaccine is recommended for everyone 6 months or older who has diabetes.  The pneumonia (pneumococcal) vaccine is recommended for everyone 2 years or older who has diabetes. If you are 14 or older, you may get the pneumonia vaccine as a series of two separate shots.  The hepatitis B vaccine is recommended for adults shortly after they have been diagnosed with  diabetes.  The Tdap (tetanus, diphtheria, and pertussis) vaccine should be given: ? According to normal childhood vaccination schedules, for children. ? Every 10 years, for adults who have diabetes.  The shingles vaccine is recommended for people who have had chicken pox and are 50 years or older.  Mental and emotional health  Screening for symptoms of eating disorders, anxiety, and depression is recommended at the time of diagnosis and afterward as needed. If your screening shows that you have symptoms (you have a positive screening result), you may need further evaluation and be referred to a mental health care provider.  Diabetes self-management education  Education about how to manage your diabetes is recommended at diagnosis and ongoing as needed.  Treatment plan  Your treatment plan will be reviewed at every medical visit.  Summary  Managing diabetes (diabetes mellitus) can be complicated. Your diabetes treatment may be managed by a team of health care providers.  Your health care providers follow a schedule in order to help you get the best quality of care.  Standards of care including having regular physical exams, blood tests, blood pressure monitoring, immunizations, screening tests, and education about how to manage your diabetes.  Your health care providers may also give you more specific instructions based on your individual health.      Type 2 Diabetes Mellitus, Self Care, Adult Caring for yourself after you have been diagnosed with type 2 diabetes (type 2 diabetes mellitus) means keeping your blood sugar (glucose) under control with a balance of:  Nutrition.  Exercise.  Lifestyle changes.  Medicines or insulin, if necessary.  Support from your team of health care providers and others.  The following information explains what you need to know to manage your diabetes at home. What do I need to do to manage my blood glucose?  Check your blood glucose  every day, as often as told by your health care provider.  Contact your health care provider if your blood glucose is above your target for 2 tests in a row.  Have your A1c (  hemoglobin A1c) level checked at least two times a year, or as often as told by your health care provider. Your health care provider will set individualized treatment goals for you. Generally, the goal of treatment is to maintain the following blood glucose levels:  Before meals (preprandial): 80-130 mg/dL (4.4-7.2 mmol/L).  After meals (postprandial): below 180 mg/dL (10 mmol/L).  A1c level: less than 7%.  What do I need to know about hyperglycemia and hypoglycemia? What is hyperglycemia? Hyperglycemia, also called high blood glucose, occurs when blood glucose is too high.Make sure you know the early signs of hyperglycemia, such as:  Increased thirst.  Hunger.  Feeling very tired.  Needing to urinate more often than usual.  Blurry vision.  What is hypoglycemia? Hypoglycemia, also called low blood glucose, occurswith a blood glucose level at or below 70 mg/dL (3.9 mmol/L). The risk for hypoglycemia increases during or after exercise, during sleep, during illness, and when skipping meals or not eating for a long time (fasting). It is important to know the symptoms of hypoglycemia and treat it right away. Always have a 15-gram rapid-acting carbohydrate snack with you to treat low blood glucose. Family members and close friends should also know the symptoms and should understand how to treat hypoglycemia, in case you are not able to treat yourself. What are the symptoms of hypoglycemia? Hypoglycemia symptoms can include:  Hunger.  Anxiety.  Sweating and feeling clammy.  Confusion.  Dizziness or feeling light-headed.  Sleepiness.  Nausea.  Increased heart rate.  Headache.  Blurry vision.  Seizure.  Nightmares.  Tingling or numbness around the mouth, lips, or tongue.  A change in  speech.  Decreased ability to concentrate.  A change in coordination.  Restless sleep.  Tremors or shakes.  Fainting.  Irritability.  How do I treat hypoglycemia?  If you are alert and able to swallow safely, follow the 15:15 rule:  Take 15 grams of a rapid-acting carbohydrate. Rapid-acting options include: ? 1 tube of glucose gel. ? 3 glucose pills. ? 6-8 pieces of hard candy. ? 4 oz (120 mL) of fruit juice. ? 4 oz (120 mL) of regular (not diet) soda.  Check your blood glucose 15 minutes after you take the carbohydrate.  If the repeat blood glucose level is still at or below 70 mg/dL (3.9 mmol/L), take 15 grams of a carbohydrate again.  If your blood glucose level does not increase above 70 mg/dL (3.9 mmol/L) after 3 tries, seek emergency medical care.  After your blood glucose level returns to normal, eat a meal or a snack within 1 hour.  How do I treat severe hypoglycemia? Severe hypoglycemia is when your blood glucose level is at or below 54 mg/dL (3 mmol/L). Severe hypoglycemia is an emergency. Do not wait to see if the symptoms will go away. Get medical help right away. Call your local emergency services (911 in the [place] Do not drive yourself to the hospital. If you have severe hypoglycemia and you cannot eat or drink, you may need an injection of glucagon. A family member or close friend should learn how to check your blood glucose and how to give you a glucagon injection. Ask your health care provider if you need to have an emergency glucagon injection kit available. Severe hypoglycemia may need to be treated in a hospital. The treatment may include getting glucose through an IV tube. You may also need treatment for the cause of your hypoglycemia. Can having diabetes put me at risk for other  conditions? Having diabetes can put you at risk for other long-term (chronic) conditions, such as heart disease and kidney disease. Your health care provider may prescribe  medicines to help prevent complications from diabetes. These medicines may include:  Aspirin.  Medicine to lower cholesterol.  Medicine to control blood pressure.  What else can I do to manage my diabetes? Take your diabetes medicines as told  If your health care provider prescribed insulin or diabetes medicines, take them every day.  Do not run out of insulin or other diabetes medicines that you take. Plan ahead so you always have these available.  If you use insulin, adjust your dosage based on how physically active you are and what foods you eat. Your health care provider will tell you how to adjust your dosage. Make healthy food choices  The things that you eat and drink affect your blood glucose and your insulin dosage. Making good choices helps to control your diabetes and prevent other health problems. A healthy meal plan includes eating lean proteins, complex carbohydrates, fresh fruits and vegetables, low-fat dairy products, and healthy fats. Make an appointment to see a diet and nutrition specialist (registered dietitian) to help you create an eating plan that is right for you. Make sure that you:  Follow instructions from your health care provider about eating or drinking restrictions.  Drink enough fluid to keep your urine clear or pale yellow.  Eat healthy snacks between nutritious meals.  Track the carbohydrates that you eat. Do this by reading food labels and learning the standard serving sizes of foods.  Follow your sick day plan whenever you cannot eat or drink as usual. Make this plan in advance with your health care provider.  Stay active  Exercise regularly, as told by your health care provider. This may include:  Stretching and doing strength exercises, such as yoga or weightlifting, at least 2 times a week.  Doing at least 150 minutes of moderate-intensity or vigorous-intensity exercise each week. This could be brisk walking, biking, or water  aerobics. ? Spread out your activity over at least 3 days of the week. ? Do not go more than 2 days in a row without doing some kind of physical activity.  When you start a new exercise or activity, work with your health care provider to adjust your insulin, medicines, or food intake as needed. Make healthy lifestyle choices  Do not use any tobacco products, such as cigarettes, chewing tobacco, and e-cigarettes. If you need help quitting, ask your health care provider.  If your health care provider says that alcohol is safe for you, limit alcohol intake to no more than 1 drink per day for nonpregnant women and 2 drinks per day for men. One drink equals 12 oz of beer, 5 oz of wine, or 1 oz of hard liquor.  Learn to manage stress. If you need help with this, ask your health care provider. Care for your body   Keep your immunizations up to date. In addition to getting vaccinations as told by your health care provider, it is recommended that you get vaccinated against the following illnesses: ? The flu (influenza). Get a flu shot every year. ? Pneumonia. ? Hepatitis B.  Schedule an eye exam soon after your diagnosis, and then one time every year after that.  Check your skin and feet every day for cuts, bruises, redness, blisters, or sores. Schedule a foot exam with your health care provider once every year.  Brush your teeth and  gums two times a day, and floss at least one time a day. Visit your dentist at least once every 6 months.  Maintain a healthy weight. General instructions  Take over-the-counter and prescription medicines only as told by your health care provider.  Share your diabetes management plan with people in your workplace, school, and household.  Check your urine for ketones when you are ill and as told by your health care provider.  Ask your health care provider: ? Do I need to meet with a diabetes educator? ? Where can I find a support group for people with  diabetes?  Carry a medical alert card or wear medical alert jewelry.  Keep all follow-up visits as told by your health care provider. This is important. Where to find more information: For more information about diabetes, visit:  American Diabetes Association (ADA): www.diabetes.org  American Association of Diabetes Educators (AADE): www.diabeteseducator.org/patient-resources  This information is not intended to replace advice given to you by your health care provider. Make sure you discuss any questions you have with your health care provider. Document Released: 04/15/2015 Document Revised: 05/30/2015 Document Reviewed: 01/25/2015 Elsevier Interactive Patient Education  2017 [place].      Blood Glucose Monitoring, Adult Monitoring your blood sugar (glucose) helps you manage your diabetes. It also helps you and your health care provider determine how well your diabetes management plan is working. Blood glucose monitoring involves checking your blood glucose as often as directed, and keeping a record (log) of your results over time. Why should I monitor my blood glucose? Checking your blood glucose regularly can:  Help you understand how food, exercise, illnesses, and medicines affect your blood glucose.  Let you know what your blood glucose is at any time. You can quickly tell if you are having low blood glucose (hypoglycemia) or high blood glucose (hyperglycemia).  Help you and your health care provider adjust your medicines as needed.  When should I check my blood glucose? Follow instructions from your health care provider about how often to check your blood glucose.   This may depend on:  The type of diabetes you have.  How well-controlled your diabetes is.  Medicines you are taking.  If you have type 1 diabetes:  Check your blood glucose at least 2 times a day.  Also check your blood glucose: ? Before every insulin injection. ? Before and after  exercise. ? Between meals. ? 2 hours after a meal. ? Occasionally between 2:00 a.m. and 3:00 a.m., as directed. ? Before potentially dangerous tasks, like driving or using heavy machinery. ? At bedtime.  You may need to check your blood glucose more often, up to 6-10 times a day: ? If you use an insulin pump. ? If you need multiple daily injections (MDI). ? If your diabetes is not well-controlled. ? If you are ill. ? If you have a history of severe hypoglycemia. ? If you have a history of not knowing when your blood glucose is getting low (hypoglycemia unawareness).  If you have type 2 diabetes:  If you take insulin or other diabetes medicines, check your blood glucose at least 2 times a day.  If you are on intensive insulin therapy, check your blood glucose at least 4 times a day. Occasionally, you may also need to check between 2:00 a.m. and 3:00 a.m., as directed.  Also check your blood glucose: ? Before and after exercise. ? Before potentially dangerous tasks, like driving or using heavy machinery.  You may  need to check your blood glucose more often if: ? Your medicine is being adjusted. ? Your diabetes is not well-controlled. ? You are ill.  What is a blood glucose log?  A blood glucose log is a record of your blood glucose readings. It helps you and your health care provider: ? Look for patterns in your blood glucose over time. ? Adjust your diabetes management plan as needed.  Every time you check your blood glucose, write down your result and notes about things that may be affecting your blood glucose, such as your diet and exercise for the day.  Most glucose meters store a record of glucose readings in the meter. Some meters allow you to download your records to a computer. How do I check my blood glucose? Follow these steps to get accurate readings of your blood glucose: Supplies needed   Blood glucose meter.  Test strips for your meter. Each meter has its own  strips. You must use the strips that come with your meter.  A needle to prick your finger (lancet). Do not use lancets more than once.  A device that holds the lancet (lancing device).  A journal or log book to write down your results.  Procedure  Wash your hands with soap and water.  Prick the side of your finger (not the tip) with the lancet. Use a different finger each time.  Gently rub the finger until a small drop of blood appears.  Follow instructions that come with your meter for inserting the test strip, applying blood to the strip, and using your blood glucose meter.  Write down your result and any notes.  Alternative testing sites  Some meters allow you to use areas of your body other than your finger (alternative sites) to test your blood.  If you think you may have hypoglycemia, or if you have hypoglycemia unawareness, do not use alternative sites. Use your finger instead.  Alternative sites may not be as accurate as the fingers, because blood flow is slower in these areas. This means that the result you get may be delayed, and it may be different from the result that you would get from your finger.  The most common alternative sites are: ? Forearm. ? Thigh. ? Palm of the hand.  Additional tips  Always keep your supplies with you.  If you have questions or need help, all blood glucose meters have a 24-hour "hotline" number that you can call. You may also contact your health care provider.  After you use a few boxes of test strips, adjust (calibrate) your blood glucose meter by following instructions that came with your meter.    The American Diabetes Association suggests the following targets for most nonpregnant adults with diabetes.  More or less stringent glycemic goals may be appropriate for each individual.  A1C: Less than 7% A1C may also be reported as eAG: Less than 154 mg/dl Before a meal (preprandial plasma glucose): 80-130 mg/dl 1-2 hours after  beginning of the meal (Postprandial plasma glucose)*: Less than 180 mg/dl  *Postprandial glucose may be targeted if A1C goals are not met despite reaching preprandial glucose goals.   GOALS in short:  The goals are for the Hgb A1C to be less than 7.0 & blood pressure to be less than 130/80.    It is recommended that all diabetics are educated on and follow a healthy diabetic diet, exercise for 30 minutes 3-4 times per week (walking, biking, swimming, or machine), monitor blood glucose  readings and bring that record with you to be reviewed at your next office visit.     You should be checking fasting blood sugars- especially after you eat poorly or eat really healthy, and also check 2 hour postprandial blood sugars after largest meal of the day.    Write these down and bring in your log at each office visit.    You will need to be seen every 3 months by the provider managing your Diabetes unless told otherwise by that provider.   You will need yearly eye exams from an eye specialist and foot exams to check the nerves of your feet.  Also, your urine should be checked yearly as well to make sure excess protein is not present.   If you are checking your blood pressure at home, please record it and bring it to your next office visit.    Follow the Dietary Approaches to Stop Hypertension (DASH) diet (3 servings of fruit and vegetables daily, whole grains, low sodium, low-fat proteins).  See below.    Lastly, when it comes to your cholesterol, the goal is to have the HDL (good cholesterol) >40, and the LDL (bad cholesterol) <100.   It is recommended that you follow a heart healthy, low saturated and trans-fat diet and exercise for 30 minutes at least 5 times a week.     (( Check out the DASH diet = 1.5 Gram Low Sodium Diet   A 1.5 gram sodium diet restricts the amount of sodium in the diet to no more than 1.5 g or 1500 mg daily.  The American Heart Association recommends Americans over the age of  54 to consume no more than 1500 mg of sodium each day to reduce the risk of developing high blood pressure.  Research also shows that limiting sodium may reduce heart attack and stroke risk.  Many foods contain sodium for flavor and sometimes as a preservative.  When the amount of sodium in a diet needs to be low, it is important to know what to look for when choosing foods and drinks.  The following includes some information and guidelines to help make it easier for you to adapt to a low sodium diet.    QUICK TIPS  Do not add salt to food.  Avoid convenience items and fast food.  Choose unsalted snack foods.  Buy lower sodium products, often labeled as "lower sodium" or "no salt added."  Check food labels to learn how much sodium is in 1 serving.  When eating at a restaurant, ask that your food be prepared with less salt or none, if possible.    READING FOOD LABELS FOR SODIUM INFORMATION  The nutrition facts label is a good place to find how much sodium is in foods. Look for products with no more than 400 mg of sodium per serving.  Remember that 1.5 g = 1500 mg.  The food label may also list foods as:  Sodium-free: Less than 5 mg in a serving.  Very low sodium: 35 mg or less in a serving.  Low-sodium: 140 mg or less in a serving.  Light in sodium: 50% less sodium in a serving. For example, if a food that usually has 300 mg of sodium is changed to become light in sodium, it will have 150 mg of sodium.  Reduced sodium: 25% less sodium in a serving. For example, if a food that usually has 400 mg of sodium is changed to reduced sodium, it will have  300 mg of sodium.    CHOOSING FOODS  Grains  Avoid: Salted crackers and snack items. Some cereals, including instant hot cereals. Bread stuffing and biscuit mixes. Seasoned rice or pasta mixes.  Choose: Unsalted snack items. Low-sodium cereals, oats, puffed wheat and rice, shredded wheat. English muffins and bread. Pasta.  Meats  Avoid: Salted,  canned, smoked, spiced, pickled meats, including fish and poultry. Bacon, ham, sausage, cold cuts, hot dogs, anchovies.  Choose: Low-sodium canned tuna and salmon. Fresh or frozen meat, poultry, and fish.  Dairy  Avoid: Processed cheese and spreads. Cottage cheese. Buttermilk and condensed milk. Regular cheese.  Choose: Milk. Low-sodium cottage cheese. Yogurt. Sour cream. Low-sodium cheese.  Fruits and Vegetables  Avoid: Regular canned vegetables. Regular canned tomato sauce and paste. Frozen vegetables in sauces. Olives. Angie Fava. Relishes. Sauerkraut.  Choose: Low-sodium canned vegetables. Low-sodium tomato sauce and paste. Frozen or fresh vegetables. Fresh and frozen fruit.  Condiments  Avoid: Canned and packaged gravies. Worcestershire sauce. Tartar sauce. Barbecue sauce. Soy sauce. Steak sauce. Ketchup. Onion, garlic, and table salt. Meat flavorings and tenderizers.  Choose: Fresh and dried herbs and spices. Low-sodium varieties of mustard and ketchup. Lemon juice. Tabasco sauce. Horseradish.    SAMPLE 1.5 GRAM SODIUM MEAL PLAN:   Breakfast / Sodium (mg)  1 cup low-fat milk / 143 mg  1 whole-wheat English muffin / 240 mg  1 tbs heart-healthy margarine / 153 mg  1 hard-boiled egg / 139 mg  1 small orange / 0 mg  Lunch / Sodium (mg)  1 cup raw carrots / 76 mg  2 tbs no salt added peanut butter / 5 mg  2 slices whole-wheat bread / 270 mg  1 tbs jelly / 6 mg   cup red grapes / 2 mg  Dinner / Sodium (mg)  1 cup whole-wheat pasta / 2 mg  1 cup low-sodium tomato sauce / 73 mg  3 oz lean ground beef / 57 mg  1 small side salad (1 cup raw spinach leaves,  cup cucumber,  cup yellow bell pepper) with 1 tsp olive oil and 1 tsp red wine vinegar / 25 mg  Snack / Sodium (mg)  1 container low-fat vanilla yogurt / 107 mg  3 graham cracker squares / 127 mg  Nutrient Analysis  Calories: 1745  Protein: 75 g  Carbohydrate: 237 g  Fat: 57 g  Sodium: 1425 mg  Document Released: 12/22/2004  Document Revised: 09/03/2010 Document Reviewed: 03/25/2009  Exit[employer] Patient Information 2012 [employer].))    This information is not intended to replace advice given to you by your health care provider. Make sure you discuss any questions you have with your health care provider. Document Released: 12/25/2002 Document Revised: 07/12/2015 Document Reviewed: 06/03/2015 [employer] Interactive Patient Education  2017 [employer].

## 2017-01-12 NOTE — Progress Notes (Signed)
Assessment and plan:  1. Type 2 diabetes mellitus with chronic kidney disease, without long-term current use of insulin, unspecified CKD stage ([place])   2. OBSTRUCTIVE SLEEP APNEA   3. Hypertension associated with diabetes ([place])   4. Hyperlipidemia associated with type 2 diabetes mellitus ([place])   5. Renal insufficiency   6. Vitamin D deficiency    1. Type II DM with chronic kidney disease: Due to pt's renal dysfunction, I preferred to start medications today. His A1c his 8.8, but pt declined medications. Pt is enthusiastic about turning over a new leaf and made a goal in 3 months to reduce his A1c below 6.5 and it be well-controlled. If A1c is not below 6.5, we will start medications at this time. Pt declined referral to nutritionist. Pt instructed to increase water intake and drink plenty of fluids. Pt strongly recommended to take his blood sugars daily, especially in situations when eating particularly unhealthy or healthy foods. Pt instructed to contact his insurance to ask what is the preferred glucometer for type II DM that is covered by them. Handouts and information provided for glucometers.   2. HTN associated with DM: Continue taking your HTN medications as listed below. Dietary and exercise guidelines discussed with patient. Handouts provided if desired.  3. HLD associated with DM: Pt instructed because of his comorbidity of chronic kidney disease and HLD, we will increase his Lipitor from 69m to 455mQHS. Pt is in agreement with this plan. Dietary and exercise guidelines discussed with patient. Instructed to decrease eating saturated and trans fats, as well as fatty carbohydrates. Handouts provided if desired.   4. Renal insufficiency: Dietary and exercise guidelines discussed with patient. Pt instructed to continue exercising daily. Increase water intake and drink plenty of fluids. Handouts and information provided if  desired.  5. Vitamin D Deficiency: Pt recommended to take vitamin D supplements 50,000 IUs/week as listed below.   -Routine labs will be ordered in 3 months. Pt will schedule an appointment for lab follow up shortly after.  Will also recheck liver function lab tests in 3 months.   Pt was in the office today for 40+ minutes, with over 50% time spent in face to face counseling of patients various medical conditions, treatment plans of those medical conditions including medicine management and lifestyle modification, strategies to improve health and well being; and in coordination of care. SEE ABOVE FOR DETAILS   Education and routine counseling performed. Handouts provided.  Pt was in the office today for 40+ minutes, with over 50% time spent in face to face counseling of patients various medical conditions, treatment plans of those medical conditions including medicine management and lifestyle modification, strategies to improve health and well being; and in coordination of care. SEE ABOVE FOR DETAILS  Orders Placed This Encounter  Procedures  . HM Diabetes Foot Exam    Meds ordered this encounter  Medications  . Vitamin D, Ergocalciferol, (DRISDOL) 50000 units CAPS capsule    Sig: Take 1 capsule (50,000 Units total) by mouth every 7 (seven) days.    Dispense:  12 capsule    Refill:  10  . DISCONTD: atorvastatin (LIPITOR) 40 MG tablet    Sig: Take 1 tablet (40 mg total) by mouth at bedtime.    Dispense:  90 tablet    Refill:  2    Return for 3 mo CMP, Vit D, A1c, then OV with me 1 wk later.   Anticipatory guidance and routine  counseling done re: condition, txmnt options and need for follow up. All questions of patient's were answered.   Gross side effects, risk and benefits, and alternatives of medications discussed with patient.  Patient is aware that all medications have potential side effects and we are unable to predict every sideeffect or drug-drug interaction that may occur.   Expresses verbal understanding and consents to current therapy plan and treatment regiment.  Please see AVS handed out to patient at the end of our visit for additional patient instructions/ counseling done pertaining to today's office visit.  Note: This document was prepared using Dragon voice recognition software and may include unintentional dictation errors.   This document serves as a record of services personally performed by Mellody Dance, DO. It was created on her behalf by Mayer Masker, a trained medical scribe. The creation of this record is based on the scribe's personal observations and the provider's statements to them.   I have reviewed the above medical documentation for accuracy and completeness and I concur.  Mellody Dance 01/16/17 1:03 PM   -----------------------------------------------------------------------------------------  Subjective:   CC:   Varun Jourdan is a 62 y.o. male who presents to [place] [facility] today for review and discussion of recent bloodwork that was done.   Pt has a h/o anemia related to his chronic kidney disease. Pt takes a multivitamin daily and works out daily.  1. All recent blood work that we ordered was reviewed with patient today.  Patient was counseled on all abnormalities and we discussed dietary and lifestyle changes that could help those values (also medications when appropriate).  Extensive health counseling performed and all patient's concerns/ questions were addressed.  2. Pt reports in the last few months he has not been able to be as physically active due to various injuries to his leg and arm. - see "problem list" section and "overview" for further details of pt's chronic med problems that were reviewed today.  Wt Readings from Last 3 Encounters:  01/12/17 253 lb (114.8 kg)  01/11/17 252 lb 6.4 oz (114.5 kg)  12/15/16 250 lb (113.4 kg)   BP Readings from Last 3 Encounters:  01/12/17 123/79    01/11/17 131/88  12/15/16 (!) 144/88   Pulse Readings from Last 3 Encounters:  01/12/17 70  01/11/17 74  12/15/16 77   BMI Readings from Last 3 Encounters:  01/12/17 34.31 kg/m  01/11/17 34.23 kg/m  12/15/16 33.91 kg/m     Patient Care Team    Relationship Specialty Notifications Start End  Mellody Dance, DO PCP - General Family Medicine  10/28/16   Rutherford Guys, MD Consulting Physician Ophthalmology  11/23/14   Jovita Gamma, MD Consulting Physician Neurosurgery  11/23/14   Kathie Rhodes, MD Consulting Physician Urology  12/07/14   Milus Banister, MD Attending Physician Gastroenterology  12/07/14   Chesley Mires, MD Consulting Physician Pulmonary Disease  12/07/16   Garald Balding, MD Consulting Physician Orthopedic Surgery  12/07/16    Comment: back and hip  Dene Gentry, MD Consulting Physician Sports Medicine  12/07/16    Comment: back and hip pain    Full medical history updated and reviewed in the office today  Patient Active Problem List   Diagnosis Date Noted  . Hypertension associated with diabetes ([place]) 12/01/2011    Priority: High  . Diabetes mellitus with renal manifestation ([place]) 06/11/2006    Priority: High  . Hyperlipidemia associated with type 2 diabetes mellitus ([place]) 05/26/2006  Priority: High  . OBSTRUCTIVE SLEEP APNEA 05/26/2006    Priority: High  . Prostatitis 04/20/2014    Priority: Medium  . Renal insufficiency 12/01/2011    Priority: Medium  . 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 Vitamin D deficiency 11/30/2007    Priority: Low  . Abdominal bloating 05/15/2015  . UTI (urinary tract infection) 04/20/2014  . Hyperglycemia 04/20/2014  . Fatigue 03/07/2014  . Family history of early CAD 03/07/2014  . Maxillary sinusitis, acute 12/06/2013  . Tinea versicolor 12/06/2013  . Physical exam 11/06/2013  . Depression 03/29/2013  . Other malaise and fatigue 03/29/2013  . Decreased libido  03/29/2013  . Hematuria 02/17/2013  . Right elbow pain 12/15/2012  . Pustular folliculitis 16/10/9602  . Left ankle pain 03/15/2012  . SPINAL STENOSIS, LUMBAR 08/02/2008  . GOUT 11/30/2007  . BPH (benign prostatic hyperplasia) 11/30/2007  . ANEMIA NEC 06/11/2006  . Chronic back pain greater than 3 months duration 06/11/2006    Past Medical History:  Diagnosis Date  . Carpal tunnel syndrome, bilateral   . Carpal tunnel syndrome, right    nerve impingement right arm/wears brace  . Diabetes mellitus    no meds  . GERD (gastroesophageal reflux disease)   . Gout   . Hepatic steatosis   . Hyperlipidemia   . Hypertension   . Obstructive sleep apnea     Past Surgical History:  Procedure Laterality Date  . CARPAL TUNNEL RELEASE  09/10/2015   RIGHT WRIST / WITH NERVE IMPINGEMENT SURGERY  . CERVICAL FUSION     Dr Sherwood Gambler  . COLONOSCOPY  2007   negative; [facility]  . ESOPHAGEAL DILATION  2007  . 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 LUMBAR FUSION      Dr Sherwood Gambler    Social History   Tobacco Use  . Smoking status: Former Smoker    Last attempt to quit: 01/06/1976    Years since quitting: 41.0  . Smokeless tobacco: Never Used  . Tobacco comment: smoked 35 years ago as of 2013   Substance Use Topics  . Alcohol use: No    Family Hx: Family History  Problem Relation Age of Onset  . Heart disease Father        pacer  . Urolithiasis Father   . Heart attack Father   . Aneurysm Mother 64       CNS aneurysm  . Sudden death Mother   . Diabetes Sister   . Cancer Maternal Uncle        ? primary  . Cancer Paternal Uncle        ? primary  . Kidney disease Brother   . Heart disease Sister   . Heart disease Sister   . Hyperlipidemia Neg Hx   . Hypertension Neg Hx   . Colon cancer Neg Hx      Medications: Current Outpatient Medications  Medication Sig Dispense Refill  . allopurinol (ZYLOPRIM) 300 MG tablet TAKE 1/2 TABLET BY MOUTH  DAILY 45 tablet 3  . amLODipine (NORVASC) 10 MG tablet TAKE 1/2 TABLET BY MOUTH THREE TIMES PER WEEK 18 tablet 6  . Ascorbic Acid (VITAMIN C) 1000 MG tablet Take 1,000 mg by mouth daily.    Marland Kitchen aspirin 81 MG tablet Take 81 mg by mouth daily.      Marland Kitchen FLUoxetine (PROZAC) 40 MG capsule TAKE 1 CAPSULE BY MOUTH DAILY 90 capsule 1  .  fluticasone (FLONASE) 50 MCG/ACT nasal spray Place 2 sprays into both nostrils daily. 16 g 6  . gabapentin (NEURONTIN) 300 MG capsule Take 1 capsule (300 mg total) by mouth 3 (three) times daily. (Patient taking differently: Take 600 mg by mouth 3 (three) times daily. ) 270 capsule 1  . Multiple Vitamins-Iron (MULTIVITAMINS WITH IRON) TABS Take 1 tablet by mouth daily. 30 tablet 0  . MYRBETRIQ 25 MG TB24 tablet     . pantoprazole (PROTONIX) 40 MG tablet TAKE 1 TABLET BY MOUTH DAILY 90 tablet 1  . polyethylene glycol powder (GLYCOLAX/MIRALAX) powder USE AS DIRECTED 850 g 11  . pyridoxine (B-6) 100 MG tablet Take 100 mg by mouth daily.    . traZODone (DESYREL) 50 MG tablet TAKE 1/2 TO 1 TABLET BY MOUTH EVERY NIGHT AT BEDTIME AS NEEDED FOR SLEEP 30 tablet 6  . atorvastatin (LIPITOR) 20 MG tablet     . benazepril (LOTENSIN) 20 MG tablet TAKE 1 TABLET BY MOUTH DAILY 90 tablet 1  . blood glucose meter kit and supplies Dispense based on patient and insurance preference. Use to check fasting blood glucose in the morning and to check glucose 2 hours after largest meal of the day. (FOR ICD-10 E10.9, E11.9). 1 each 0  . glucose blood test strip Use as instructed 100 each 12  . LANCETS ULTRA FINE MISC Use to check fasting blood glucose in the morning and to check glucose 2 hours after largest meal of the day 100 each 12  . Vitamin D, Ergocalciferol, (DRISDOL) 50000 units CAPS capsule Take 1 capsule (50,000 Units total) by mouth every 7 (seven) days. 12 capsule 10   No current facility-administered medications for this visit.     Allergies:  Allergies  Allergen Reactions  .  Prednisone Other (See Comments)    Caused blood sugar to increase, pt will not take     Review of Systems: General:   No F/C, wt loss Pulm:   No DIB, SOB, pleuritic chest pain Card:  No CP, palpitations Abd:  No n/v/d or pain Ext:  No inc edema from baseline  Objective:  Blood pressure 123/79, pulse 70, height 6' (1.829 m), weight 253 lb (114.8 kg), SpO2 98 %. Body mass index is 34.31 kg/m. Gen:   Well NAD, A and O *3 HEENT:    [place]/AT, EOMI,  MMM Lungs:   Normal work of breathing. CTA B/L, no Wh, rhonchi Heart:   RRR, S1, S2 WNL's, no MRG Abd:   No gross distention Exts:    warm, pink,  Brisk capillary refill, warm and well perfused.  Psych:    No HI/SI, judgement and insight good, Euthymic mood. Full Affect.   Recent Results (from the past 2160 hour(s))  POCT UA - Microalbumin     Status: Abnormal   Collection Time: 12/07/16  5:14 PM  Result Value Ref Range   Microalbumin Ur, POC 80 mg/L   Creatinine, POC 200 mg/dL   Albumin/Creatinine Ratio, Urine, POC 30-300   VITAMIN D 25 Hydroxy (Vit-D Deficiency, Fractures)     Status: Abnormal   Collection Time: 01/08/17  8:46 AM  Result Value Ref Range   Vit D, 25-Hydroxy 26.6 (L) 30.0 - 100.0 ng/mL    Comment: Vitamin D deficiency has been defined by the [place] and an Endocrine Society practice guideline as a level of serum 25-OH vitamin D less than 20 ng/mL (1,2). The Endocrine Society went on to further define vitamin D insufficiency as a  level between 21 and 29 ng/mL (2). 1. IOM (Institute of Medicine). 2010. Dietary reference    intakes for calcium and D. [place]: The    [employer]. 2. Holick MF, Binkley [place], Bischoff-Ferrari HA, et al.    Evaluation, treatment, and prevention of vitamin D    deficiency: an Endocrine Society clinical practice    guideline. JCEM. 2011 Jul; 96(7):1911-30.   TSH     Status: None   Collection Time: 01/08/17  8:46 AM  Result Value Ref Range   TSH 2.950 0.450  - 4.500 uIU/mL  T4, free     Status: None   Collection Time: 01/08/17  8:46 AM  Result Value Ref Range   Free T4 1.05 0.82 - 1.77 ng/dL  Lipid panel     Status: Abnormal   Collection Time: 01/08/17  8:46 AM  Result Value Ref Range   Cholesterol, Total 170 100 - 199 mg/dL   Triglycerides 91 0 - 149 mg/dL   HDL 42 >39 mg/dL   VLDL Cholesterol Cal 18 5 - 40 mg/dL   LDL Calculated 110 (H) 0 - 99 mg/dL   Chol/HDL Ratio 4.0 0.0 - 5.0 ratio    Comment:                                   T. Chol/HDL Ratio                                             Men  Women                               1/2 Avg.Risk  3.4    3.3                                   Avg.Risk  5.0    4.4                                2X Avg.Risk  9.6    7.1                                3X Avg.Risk 23.4   11.0   Hemoglobin A1c     Status: Abnormal   Collection Time: 01/08/17  8:46 AM  Result Value Ref Range   Hgb A1c MFr Bld 8.8 (H) 4.8 - 5.6 %    Comment:          Prediabetes: 5.7 - 6.4          Diabetes: >6.4          Glycemic control for adults with diabetes: <7.0    Est. average glucose Bld gHb Est-mCnc 206 mg/dL  Comprehensive metabolic panel     Status: Abnormal   Collection Time: 01/08/17  8:46 AM  Result Value Ref Range   Glucose 243 (H) 65 - 99 mg/dL   BUN 18 8 - 27 mg/dL   Creatinine, Ser 1.50 (H) 0.76 - 1.27 mg/dL   GFR calc non Af Amer 49 (L) >59 mL/min/1.73  GFR calc Af Amer 57 (L) >59 mL/min/1.73   BUN/Creatinine Ratio 12 10 - 24   Sodium 142 134 - 144 mmol/L   Potassium 4.8 3.5 - 5.2 mmol/L   Chloride 105 96 - 106 mmol/L   CO2 21 20 - 29 mmol/L   Calcium 9.5 8.6 - 10.2 mg/dL   Total Protein 7.6 6.0 - 8.5 g/dL   Albumin 4.4 3.6 - 4.8 g/dL   Globulin, Total 3.2 1.5 - 4.5 g/dL   Albumin/Globulin Ratio 1.4 1.2 - 2.2   Bilirubin Total 0.4 0.0 - 1.2 mg/dL   Alkaline Phosphatase 106 39 - 117 IU/L   AST 26 0 - 40 IU/L   ALT 27 0 - 44 IU/L  CBC with Differential/Platelet     Status: Abnormal    Collection Time: 01/08/17  8:46 AM  Result Value Ref Range   WBC 5.1 3.4 - 10.8 x10E3/uL   RBC 4.63 4.14 - 5.80 x10E6/uL   Hemoglobin 12.4 (L) 13.0 - 17.7 g/dL   Hematocrit 38.6 37.5 - 51.0 %   MCV 83 79 - 97 fL   MCH 26.8 26.6 - 33.0 pg   MCHC 32.1 31.5 - 35.7 g/dL   RDW 14.0 12.3 - 15.4 %   Platelets 250 150 - 379 x10E3/uL   Neutrophils 67 Not Estab. %   Lymphs 20 Not Estab. %   Monocytes 9 Not Estab. %   Eos 3 Not Estab. %   Basos 1 Not Estab. %   Neutrophils Absolute 3.4 1.4 - 7.0 x10E3/uL   Lymphocytes Absolute 1.0 0.7 - 3.1 x10E3/uL   Monocytes Absolute 0.5 0.1 - 0.9 x10E3/uL   EOS (ABSOLUTE) 0.2 0.0 - 0.4 x10E3/uL   Basophils Absolute 0.0 0.0 - 0.2 x10E3/uL   Immature Granulocytes 0 Not Estab. %   Immature Grans (Abs) 0.0 0.0 - 0.1 x10E3/uL

## 2017-01-12 NOTE — Telephone Encounter (Signed)
Wife called back about glucometer, says patient can get one free with all supplies through [facility].

## 2017-01-13 ENCOUNTER — Encounter: Payer: Self-pay | Admitting: Family Medicine

## 2017-01-13 ENCOUNTER — Other Ambulatory Visit: Payer: Self-pay

## 2017-01-13 ENCOUNTER — Telehealth: Payer: Self-pay | Admitting: Family Medicine

## 2017-01-13 DIAGNOSIS — E1122 Type 2 diabetes mellitus with diabetic chronic kidney disease: Secondary | ICD-10-CM

## 2017-01-13 MED ORDER — LANCETS ULTRA FINE MISC: 12 refills | Status: AC

## 2017-01-13 MED ORDER — GLUCOSE BLOOD VI STRP: ORAL_STRIP | 12 refills | Status: DC

## 2017-01-13 MED ORDER — BLOOD GLUCOSE METER KIT: PACK | 0 refills | Status: AC

## 2017-01-13 NOTE — Telephone Encounter (Signed)
Error. MPulliam, CMA/RT(R)  

## 2017-01-13 NOTE — Telephone Encounter (Signed)
Corene Cornea with [facility] wants to speak to someone about Mr. Herbert's prescription that was sent in. He can be reached at [phone]

## 2017-01-13 NOTE — Assessment & Plan Note (Signed)
2/2 radiculopathy.  Known advanced arthritis of low back and s/p 2 surgeries.  Checking on physical therapy and will reorder this if needed - he hasn't heard from them.  Increase gabapentin to 600mg  tid.  F/u in 1 month.

## 2017-01-13 NOTE — Progress Notes (Signed)
PCP: Mellody Dance, DO  Subjective:   HPI: Patient is a 62 y.o. male here for low back pain.  12/6: Patient reports having had two separate back surgeries and noted only about 3 months of benefit beyond these. Current pain is worse the past 3  Months without new injury or trauma. Pain level 4/10 but up to 10/10 and sharp at times. Feels on left side though can radiate down to left knee with numbness. Worse with standing. Takes ibuprofen. Was on lyrica but now on gabapentin. Also had injection before which helped some. No bowel/bladder dysfunction. + night pain and difficulty getting comfortable.  01/12/17: Patient reports he feels about the same as last visit. He hasn't heard from physical therapy. Is taking the higher dose of gabapentin, 300 tid. Pain up to 4/10 currently and his left leg will go numb if up on it a lot. Worse with sitting also. No bowel/bladder dysfunction.  Past Medical History:  Diagnosis Date  . Carpal tunnel syndrome, bilateral   . Carpal tunnel syndrome, right    nerve impingement right arm/wears brace  . Diabetes mellitus    no meds  . GERD (gastroesophageal reflux disease)   . Gout   . Hepatic steatosis   . Hyperlipidemia   . Hypertension   . Obstructive sleep apnea     Current Outpatient Medications on File Prior to Visit  Medication Sig Dispense Refill  . allopurinol (ZYLOPRIM) 300 MG tablet TAKE 1/2 TABLET BY MOUTH DAILY 45 tablet 3  . amLODipine (NORVASC) 10 MG tablet TAKE 1/2 TABLET BY MOUTH THREE TIMES PER WEEK 18 tablet 6  . Ascorbic Acid (VITAMIN C) 1000 MG tablet Take 1,000 mg by mouth daily.    Marland Kitchen aspirin 81 MG tablet Take 81 mg by mouth daily.      Marland Kitchen atorvastatin (LIPITOR) 20 MG tablet     . FLUoxetine (PROZAC) 40 MG capsule TAKE 1 CAPSULE BY MOUTH DAILY 90 capsule 1  . fluticasone (FLONASE) 50 MCG/ACT nasal spray Place 2 sprays into both nostrils daily. 16 g 6  . gabapentin (NEURONTIN) 300 MG capsule Take 1 capsule (300 mg total)  by mouth 3 (three) times daily. (Patient taking differently: Take 600 mg by mouth 3 (three) times daily. ) 270 capsule 1  . Multiple Vitamins-Iron (MULTIVITAMINS WITH IRON) TABS Take 1 tablet by mouth daily. 30 tablet 0  . MYRBETRIQ 25 MG TB24 tablet     . pantoprazole (PROTONIX) 40 MG tablet TAKE 1 TABLET BY MOUTH DAILY 90 tablet 1  . polyethylene glycol powder (GLYCOLAX/MIRALAX) powder USE AS DIRECTED 850 g 11  . pyridoxine (B-6) 100 MG tablet Take 100 mg by mouth daily.    . traZODone (DESYREL) 50 MG tablet TAKE 1/2 TO 1 TABLET BY MOUTH EVERY NIGHT AT BEDTIME AS NEEDED FOR SLEEP 30 tablet 6   No current facility-administered medications on file prior to visit.     Past Surgical History:  Procedure Laterality Date  . CARPAL TUNNEL RELEASE  09/10/2015   RIGHT WRIST / WITH NERVE IMPINGEMENT SURGERY  . CERVICAL FUSION     Dr Sherwood Gambler  . COLONOSCOPY  2007   negative; [facility]  . ESOPHAGEAL DILATION  2007  . 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 LUMBAR FUSION      Dr Sherwood Gambler    Allergies  Allergen Reactions  . Prednisone Other (See Comments)    Caused blood sugar to increase, pt will not take  Social History   Socioeconomic History  . Marital status: Married    Spouse name: Not on file  . Number of children: Not on file  . Years of education: Not on file  . Highest education level: Not on file  Social Needs  . Financial resource strain: Not on file  . Food insecurity - worry: Not on file  . Food insecurity - inability: Not on file  . Transportation needs - medical: Not on file  . Transportation needs - non-medical: Not on file  Occupational History  . Not on file  Tobacco Use  . Smoking status: Former Smoker    Last attempt to quit: 01/06/1976    Years since quitting: 41.0  . Smokeless tobacco: Never Used  . Tobacco comment: smoked 35 years ago as of 2013   Substance and Sexual Activity  . Alcohol use: No  . Drug use: No  .  Sexual activity: Yes    Birth control/protection: None  Other Topics Concern  . Not on file  Social History Narrative  . Not on file    Family History  Problem Relation Age of Onset  . Heart disease Father        pacer  . Urolithiasis Father   . Heart attack Father   . Aneurysm Mother 71       CNS aneurysm  . Sudden death Mother   . Diabetes Sister   . Cancer Maternal Uncle        ? primary  . Cancer Paternal Uncle        ? primary  . Kidney disease Brother   . Heart disease Sister   . Heart disease Sister   . Hyperlipidemia Neg Hx   . Hypertension Neg Hx   . Colon cancer Neg Hx     BP 131/88   Pulse 74   Ht 6' (1.829 m)   Wt 252 lb 6.4 oz (114.5 kg)   BMI 34.23 kg/m   Review of Systems: See HPI above.     Objective:  Physical Exam:  Gen: NAD, comfortable in exam room.  Back: No gross deformity, scoliosis. No TTP .  No midline or bony TTP. FROM with pain on flexion. Strength LEs 5/5 all muscle groups.   1+ MSRs in right patellar, 2+ in left patellar and achilles tendons. Negative SLRs. Sensation feels more intense in left leg distally throughout  Assessment & Plan:  1. Low back pain with radiation into left leg - 2/2 radiculopathy.  Known advanced arthritis of low back and s/p 2 surgeries.  Checking on physical therapy and will reorder this if needed - he hasn't heard from them.  Increase gabapentin to 600mg  tid.  F/u in 1 month.

## 2017-01-13 NOTE — Assessment & Plan Note (Signed)
>>  ASSESSMENT AND PLAN FOR CHRONIC LOW BACK PAIN- W LUMBAR RADICULOPATHY WRITTEN ON 01/13/2017  3:35 PM BY HUDNALL, Azucena Fallen, MD  2/2 radiculopathy.  Known advanced arthritis of low back and s/p 2 surgeries.  Checking on physical therapy and will reorder this if needed - he hasn't heard from them.  Increase gabapentin to 600mg  tid.  F/u in 1 month.

## 2017-01-13 NOTE — Telephone Encounter (Signed)
Faxed RX to [facility]. MPulliam, CMA/RT(R)

## 2017-01-13 NOTE — Telephone Encounter (Signed)
Called and spoke [place] and he stated that [facility] did not do the meters and supplies anymore, but [facility] does.  Refaxed to [facility] and notified the patient of the change. MPulliam, CMA/RT(R)

## 2017-01-21 ENCOUNTER — Encounter: Payer: Self-pay | Admitting: Physical Therapy

## 2017-01-21 ENCOUNTER — Ambulatory Visit: Payer: PPO | Attending: Family Medicine | Admitting: Physical Therapy

## 2017-01-21 DIAGNOSIS — G8929 Other chronic pain: Secondary | ICD-10-CM | POA: Diagnosis not present

## 2017-01-21 DIAGNOSIS — R293 Abnormal posture: Secondary | ICD-10-CM | POA: Diagnosis not present

## 2017-01-21 DIAGNOSIS — M5442 Lumbago with sciatica, left side: Secondary | ICD-10-CM | POA: Insufficient documentation

## 2017-01-21 DIAGNOSIS — M791 Myalgia, unspecified site: Secondary | ICD-10-CM | POA: Diagnosis not present

## 2017-01-21 NOTE — Therapy (Signed)
[place], [place], [address] Phone: [phone]   Fax:  [phone]  Physical Therapy Evaluation  Patient Details  Name: [name] MRN: [id number] Date of Birth: July 13, 1955 Referring Provider: [name]   Encounter Date: 01/21/2017  PT End of Session - 01/21/17 1602    Visit Number  1    Number of Visits  12    Date for PT Re-Evaluation  03/04/17    Authorization Type  [employer]    PT Start Time  1500    PT Stop Time  1543    PT Time Calculation (min)  43 min    Activity Tolerance  Patient tolerated treatment well    Behavior During Therapy  [facility] for tasks assessed/performed       Past Medical History:  Diagnosis Date  . Carpal tunnel syndrome, bilateral   . Carpal tunnel syndrome, right    nerve impingement right arm/wears brace  . Diabetes mellitus    no meds  . GERD (gastroesophageal reflux disease)   . Gout   . Hepatic steatosis   . Hyperlipidemia   . Hypertension   . Obstructive sleep apnea     Past Surgical History:  Procedure Laterality Date  . CARPAL TUNNEL RELEASE  09/10/2015   RIGHT WRIST / WITH NERVE IMPINGEMENT SURGERY  . CERVICAL FUSION     Dr Sherwood Gambler  . COLONOSCOPY  2007   negative; [facility]  . ESOPHAGEAL DILATION  2007  . 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 LUMBAR FUSION      Dr Sherwood Gambler    There were no vitals filed for this visit.   Subjective Assessment - 01/21/17 1503    Subjective  Pt is a 62 y/o male who presents to [facility] for Lt sided LBP with radicular symptoms into LLE.  Pt c/o numbness and pain into LLE.  Pt reports chronic pain x many years and feels improvement since increasing dose of gabapentin.  Recent exacerbation in LLE symptoms x 1 month.    Pertinent History  hx 2 back surgeries (L5/S1 fusion, and ACDF)    Limitations  Standing;Walking    How long can you stand comfortably?  numbness  occurs in 5-10 min    How long can you walk comfortably?  1.5 miles    Diagnostic tests  none recently    Patient Stated Goals  improve pain; relieve numbness    Currently in Pain?  Yes    Pain Score  4  up to 8/10    Pain Location  Back    Pain Orientation  Left    Pain Descriptors / Indicators  Numbness;Burning;Aching    Pain Type  Neuropathic pain;Chronic pain    Pain Onset  More than a month ago    Pain Frequency  Constant    Aggravating Factors   standing, walking, sitting upright and leaning on Lt side    Pain Relieving Factors  repositioning, sitting after standing and walking, gabapentin, ibuprofen         [facility] PT Assessment - 01/21/17 1509      Assessment   Medical Diagnosis  LBP    Referring Provider  [name]    Onset Date/Surgical Date  -- chronic x many years (back surgery 7-8 years)    Next MD Visit  02/11/17    Prior Therapy  previous sessions following surgery      Precautions  Precautions  None      Restrictions   Weight Bearing Restrictions  No      Balance Screen   Has the patient fallen in the past 6 months  No    Has the patient had a decrease in activity level because of a fear of falling?   No    Is the patient reluctant to leave their home because of a fear of falling?   No      Home Social worker  Private residence    Living Arrangements  Spouse/significant other;Children 57 y/o daughter    Type of [place] to enter    Entrance Stairs-Number of Steps  5    [place]  One level    Additional Comments  denies difficulty with stairs      Prior Function   Level of Independence  Independent    Vocation  On disability x 5 years    Leisure  church, exercises: treadmill x 30 min; then alternating upper body and legs/abs; M-F [employer])      Cognition   Overall Cognitive Status  Within Functional Limits for tasks assessed      Observation/Other  Assessments   Focus on Therapeutic Outcomes (FOTO)   60 (40% limited; predicted 38% limited)      Posture/Postural Control   Posture/Postural Control  Postural limitations    Postural Limitations  Rounded Shoulders;Forward head      ROM / Strength   AROM / PROM / Strength  AROM;Strength      AROM   AROM Assessment Site  Lumbar    Lumbar Flexion  90    Lumbar Extension  35    Lumbar - Right Side Bend  20    Lumbar - Left Side Bend  16 pain at end range on Lt      Strength   Strength Assessment Site  Hip;Knee;Ankle    Right/Left Hip  Right;Left    Right Hip Flexion  5/5    Right Hip Extension  5/5    Right Hip ABduction  4/5    Left Hip Flexion  4/5    Left Hip Extension  3+/5    Left Hip ABduction  4/5    Right/Left Knee  Right;Left    Right Knee Flexion  5/5    Right Knee Extension  5/5    Left Knee Flexion  5/5    Left Knee Extension  5/5    Right/Left Ankle  Right;Left    Right Ankle Dorsiflexion  5/5    Left Ankle Dorsiflexion  5/5      Flexibility   Soft Tissue Assessment /Muscle Length  yes    Hamstrings  WNL    Piriformis  tightness Lt > Rt      Palpation   Palpation comment  trigger points noted in glutes and piriformis      Special Tests    Special Tests  Lumbar    Lumbar Tests  Straight Leg Raise      Straight Leg Raise   Findings  Negative             Objective measurements completed on examination: See above findings.      [facility] Adult PT Treatment/Exercise - 01/21/17 1509      Exercises   Exercises  Lumbar      Lumbar Exercises: Stretches   Other  Lumbar Stretch Exercise  instructed in piriformis and knee to chest exercises; as well as use of tennis ball for myofascial release             PT Education - 01/21/17 1600    Education provided  Yes    Education Details  HEP, use of tennis ball for myofascial release, DN    Person(s) Educated  Patient    Methods  Explanation;Demonstration;Handout    Comprehension  Verbalized  understanding;Need further instruction          PT Long Term Goals - 01/21/17 1606      PT LONG TERM GOAL #1   Title  independent with HEP    Status  New    Target Date  03/04/17      PT LONG TERM GOAL #2   Title  perform lumbar ROM without increase in pain    Status  New    Target Date  03/04/17      PT LONG TERM GOAL #3   Title  report ability to stand > 15 min without LLE symptoms or pain for improved function    Status  New    Target Date  03/04/17      PT LONG TERM GOAL #4   Title  report pain < 3/10 with walking > 2 miles for improved function and mobility    Status  New    Target Date  03/04/17             Plan - 01/21/17 1603    Clinical Impression Statement  Pt is a 62 y/o male who presents to [facility] with c/o chronic LBP with new symptoms radiating into LLE.  Pt demonstrates mild strength deficits, pain with ROM and increased trigger points which reproduce LLE symptoms in glutes (glute med specifically) affecting functional mobility.  Pt will benefit from PT to address deficits and maximize function.    History and Personal Factors relevant to plan of care:  L5/S1 fusion, HTN, gout    Clinical Presentation  Stable    Clinical Decision Making  Low    Rehab Potential  Good    PT Frequency  2x / week    PT Duration  6 weeks    PT Treatment/Interventions  ADLs/Self Care Home Management;Cryotherapy;Electrical Stimulation;Moist Heat;Therapeutic exercise;Traction;Therapeutic activities;Functional mobility training;Stair training;Gait training;Ultrasound;Balance training;Neuromuscular re-education;Patient/family education;Manual techniques;Taping;Dry needling    PT Next Visit Plan  DN to glutes/piriformis if pt agreeable, review HEP, manual/modalities PRN, initiate core strengthening    Consulted and Agree with Plan of Care  Patient       Patient will benefit from skilled therapeutic intervention in order to improve the following deficits and impairments:  Pain,  Increased fascial restricitons, Increased muscle spasms, Postural dysfunction, Decreased mobility, Difficulty walking, Decreased strength  Visit Diagnosis: Chronic left-sided low back pain with left-sided sciatica - Plan: PT plan of care cert/re-cert  Myalgia - Plan: PT plan of care cert/re-cert  Abnormal posture - Plan: PT plan of care cert/re-cert     Problem List Patient Active Problem List   Diagnosis Date Noted  . Abdominal bloating 05/15/2015  . UTI (urinary tract infection) 04/20/2014  . Prostatitis 04/20/2014  . Hyperglycemia 04/20/2014  . Fatigue 03/07/2014  . Family history of early CAD 03/07/2014  . Maxillary sinusitis, acute 12/06/2013  . Tinea versicolor 12/06/2013  . Physical exam 11/06/2013  . 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 Depression 03/29/2013  . Other malaise and fatigue 03/29/2013  .  Decreased libido 03/29/2013  . Hematuria 02/17/2013  . Right elbow pain 12/15/2012  . Pustular folliculitis 17/00/1749  . Left ankle pain 03/15/2012  . Hypertension associated with diabetes ([place]) 12/01/2011  . Renal insufficiency 12/01/2011  . SPINAL STENOSIS, LUMBAR 08/02/2008  . Vitamin D deficiency 11/30/2007  . GOUT 11/30/2007  . BPH (benign prostatic hyperplasia) 11/30/2007  . Diabetes mellitus with renal manifestation ([place]) 06/11/2006  . ANEMIA NEC 06/11/2006  . Chronic back pain greater than 3 months duration 06/11/2006  . Hyperlipidemia associated with type 2 diabetes mellitus ([place]) 05/26/2006  . OBSTRUCTIVE SLEEP APNEA 05/26/2006      Laureen Abrahams, PT, DPT 01/21/17 4:11 PM     [facility] [address] [place], [place], [address] Phone: [phone]   Fax:  [phone]  Name: Rahman Ferrall MRN: [id number] Date of Birth: 1955-05-12

## 2017-01-21 NOTE — Patient Instructions (Signed)
Extensors / Rotators, Supine    Lie supine, one leg straight, other leg bent, knee held by opposite hand. Gently pull knee toward opposite shoulder. Feel stretch in buttocks and outside of hip. Hold _30__ seconds. Repeat _2-3__ times per session. Do _2-3__ sessions per day.  Knee to Chest (Flexion)    Pull knee toward chest. Feel stretch in lower back or buttock area. Breathing deeply, Hold _30___ seconds. Repeat with other knee. Repeat _2-3___ times. Do __2-3__ sessions per day.   Trigger Point Dry Needling  . What is Trigger Point Dry Needling (DN)? o DN is a physical therapy technique used to treat muscle pain and dysfunction. Specifically, DN helps deactivate muscle trigger points (muscle knots).  o A thin filiform needle is used to penetrate the skin and stimulate the underlying trigger point. The goal is for a local twitch response (LTR) to occur and for the trigger point to relax. No medication of any kind is injected during the procedure.   . What Does Trigger Point Dry Needling Feel Like?  o The procedure feels different for each individual patient. Some patients report that they do not actually feel the needle enter the skin and overall the process is not painful. Very mild bleeding may occur. However, many patients feel a deep cramping in the muscle in which the needle was inserted. This is the local twitch response.   Marland Kitchen How Will I feel after the treatment? o Soreness is normal, and the onset of soreness may not occur for a few hours. Typically this soreness does not last longer than two days.  o Bruising is uncommon, however; ice can be used to decrease any possible bruising.  o In rare cases feeling tired or nauseous after the treatment is normal. In addition, your symptoms may get worse before they get better, this period will typically not last longer than 24 hours.   . What Can I do After My Treatment? o Increase your hydration by drinking more water for the next 24  hours. o You may place ice or heat on the areas treated that have become sore, however, do not use heat on inflamed or bruised areas. Heat often brings more relief post needling. o You can continue your regular activities, but vigorous activity is not recommended initially after the treatment for 24 hours. o DN is best combined with other physical therapy such as strengthening, stretching, and other therapies.

## 2017-01-22 DIAGNOSIS — G4733 Obstructive sleep apnea (adult) (pediatric): Secondary | ICD-10-CM | POA: Diagnosis not present

## 2017-01-27 ENCOUNTER — Encounter: Payer: Self-pay | Admitting: Physical Therapy

## 2017-01-27 ENCOUNTER — Ambulatory Visit: Payer: PPO | Admitting: Physical Therapy

## 2017-01-27 ENCOUNTER — Telehealth: Payer: Self-pay | Admitting: Pulmonary Disease

## 2017-01-27 DIAGNOSIS — M5442 Lumbago with sciatica, left side: Secondary | ICD-10-CM | POA: Diagnosis not present

## 2017-01-27 DIAGNOSIS — M791 Myalgia, unspecified site: Secondary | ICD-10-CM

## 2017-01-27 DIAGNOSIS — R293 Abnormal posture: Secondary | ICD-10-CM

## 2017-01-27 DIAGNOSIS — G8929 Other chronic pain: Secondary | ICD-10-CM

## 2017-01-27 NOTE — Telephone Encounter (Signed)
Rec'd cpap download from 12-27-16 to 01-26-16 for review Currently VS is out of the office this week When he returns back in office he will review the DL Placed in green folder in his cubby for review

## 2017-01-27 NOTE — Therapy (Signed)
[place], [place], [address] Phone: [phone]   Fax:  [phone]  Physical Therapy Treatment  Patient Details  Name: [name] MRN: [id number] Date of Birth: 05-17-1955 Referring Provider: [name]   Encounter Date: 01/27/2017  PT End of Session - 01/27/17 1629    Visit Number  2    Number of Visits  12    Date for PT Re-Evaluation  03/04/17    PT Start Time  1630    PT Stop Time  1713    PT Time Calculation (min)  43 min    Activity Tolerance  Patient tolerated treatment well    Behavior During Therapy  [facility] for tasks assessed/performed       Past Medical History:  Diagnosis Date  . Carpal tunnel syndrome, bilateral   . Carpal tunnel syndrome, right    nerve impingement right arm/wears brace  . Diabetes mellitus    no meds  . GERD (gastroesophageal reflux disease)   . Gout   . Hepatic steatosis   . Hyperlipidemia   . Hypertension   . Obstructive sleep apnea     Past Surgical History:  Procedure Laterality Date  . CARPAL TUNNEL RELEASE  09/10/2015   RIGHT WRIST / WITH NERVE IMPINGEMENT SURGERY  . CERVICAL FUSION     Dr Sherwood Gambler  . COLONOSCOPY  2007   negative; [facility]  . ESOPHAGEAL DILATION  2007  . 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 LUMBAR FUSION      Dr Sherwood Gambler    There were no vitals filed for this visit.  Subjective Assessment - 01/27/17 1632    Subjective  "I have been doing the stretches but it still hurts and seems to be more sore today since the last session"    Currently in Pain?  Yes    Pain Score  4     Pain Onset  More than a month ago    Pain Frequency  Constant                      [facility] Adult PT Treatment/Exercise - 01/27/17 1643      Lumbar Exercises: Stretches   Active Hamstring Stretch  3 reps;30 seconds;Left PNF contract/ relax with 10 sec contraction    Piriformis Stretch  2 reps;30  seconds;Left      Lumbar Exercises: Aerobic   Nustep  L5 x 5 min LE only      Lumbar Exercises: Supine   Straight Leg Raise  10 reps 2 x 10       Manual Therapy   Manual Therapy  Soft tissue mobilization;Muscle Energy Technique;Joint mobilization    Manual therapy comments  skilled palpation and monitoring throughout DN    Joint Mobilization  L posterior hip mobs grade 3    Soft tissue mobilization  IASTM over L piriformis/ glute med    Muscle Energy Technique  R sidelying: resisted L hip flexion 5 x 10 sec combined with anteroir innominate mob onthe L       Trigger Point Dry Needling - 01/27/17 1633    Consent Given?  Yes    Education Handout Provided  Yes    Muscles Treated Upper Body  Piriformis    Muscles Treated Lower Body  Piriformis;Gluteus minimus    Gluteus Minimus Response  Twitch response elicited;Palpable increased muscle length glute medius on the L  Piriformis Response  Twitch response elicited;Palpable increased muscle length L only           PT Education - 01/27/17 1713    Education provided  Yes    Education Details  muscle anatomy and referral patterns. What DN is, benefits/ what to expect and after care. updated HEP for hamstring stretch and SLR    Person(s) Educated  Patient    Methods  Explanation;Verbal cues;Handout    Comprehension  Verbalized understanding;Verbal cues required          PT Long Term Goals - 01/21/17 1606      PT LONG TERM GOAL #1   Title  independent with HEP    Status  New    Target Date  03/04/17      PT LONG TERM GOAL #2   Title  perform lumbar ROM without increase in pain    Status  New    Target Date  03/04/17      PT LONG TERM GOAL #3   Title  report ability to stand > 15 min without LLE symptoms or pain for improved function    Status  New    Target Date  03/04/17      PT LONG TERM GOAL #4   Title  report pain < 3/10 with walking > 2 miles for improved function and mobility    Status  New    Target Date   03/04/17            Plan - 01/27/17 1714    Clinical Impression Statement  pt reports consistency with his HEP / stretching program but conitnues to have pain and tightnes in the L hip. Edcuated and performed DN along the glute medius and piriformis with followed with soft tissue techniques. Pt also demonstrates positive findings suggesting posterior innominate rotation on the L and reported releif following stretching and MET techniques. updated HEP for hamstring stretching and SLR. pt reported pain dropped to 1-2/10 post session and declined modalities.     PT Next Visit Plan  response to DN along the piriformis,  if pt agreeable, review HEP, manual/modalities PRN, initiate core strengthening    PT Home Exercise Plan  single knee to chest, piriformis stretching, hamstring stretch and SLR    Consulted and Agree with Plan of Care  Patient       Patient will benefit from skilled therapeutic intervention in order to improve the following deficits and impairments:  Pain, Increased fascial restricitons, Increased muscle spasms, Postural dysfunction, Decreased mobility, Difficulty walking, Decreased strength  Visit Diagnosis: Chronic left-sided low back pain with left-sided sciatica  Myalgia  Abnormal posture     Problem List Patient Active Problem List   Diagnosis Date Noted  . Abdominal bloating 05/15/2015  . UTI (urinary tract infection) 04/20/2014  . Prostatitis 04/20/2014  . Hyperglycemia 04/20/2014  . Fatigue 03/07/2014  . Family history of early CAD 03/07/2014  . Maxillary sinusitis, acute 12/06/2013  . Tinea versicolor 12/06/2013  . Physical exam 11/06/2013  . 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 Depression 03/29/2013  . Other malaise and fatigue 03/29/2013  . Decreased libido 03/29/2013  . Hematuria 02/17/2013  . Right elbow pain 12/15/2012  . Pustular folliculitis 49/67/5916  . Left ankle pain 03/15/2012  . Hypertension associated with  diabetes ([place]) 12/01/2011  . Renal insufficiency 12/01/2011  . SPINAL STENOSIS, LUMBAR 08/02/2008  . Vitamin D deficiency 11/30/2007  . GOUT 11/30/2007  . BPH (benign prostatic hyperplasia)  11/30/2007  . Diabetes mellitus with renal manifestation ([place]) 06/11/2006  . ANEMIA NEC 06/11/2006  . Chronic back pain greater than 3 months duration 06/11/2006  . Hyperlipidemia associated with type 2 diabetes mellitus ([place]) 05/26/2006  . OBSTRUCTIVE SLEEP APNEA 05/26/2006    [place] 01/27/2017, 5:22 PM  [facility] [address] [place], [place], [address] Phone: [phone]   Fax:  [phone]  Name: [name] MRN: [id number] Date of Birth: 26-Jun-1955

## 2017-02-03 ENCOUNTER — Encounter: Payer: Self-pay | Admitting: Physical Therapy

## 2017-02-03 ENCOUNTER — Ambulatory Visit: Payer: PPO | Admitting: Physical Therapy

## 2017-02-03 DIAGNOSIS — G8929 Other chronic pain: Secondary | ICD-10-CM

## 2017-02-03 DIAGNOSIS — M5442 Lumbago with sciatica, left side: Secondary | ICD-10-CM | POA: Diagnosis not present

## 2017-02-03 DIAGNOSIS — M791 Myalgia, unspecified site: Secondary | ICD-10-CM

## 2017-02-03 DIAGNOSIS — R293 Abnormal posture: Secondary | ICD-10-CM

## 2017-02-03 NOTE — Therapy (Signed)
[place], [place], [address] Phone: [phone]   Fax:  [phone]  Physical Therapy Treatment  Patient Details  Name: [name] MRN: [id number] Date of Birth: January 20, 1955 Referring Provider: [name]   Encounter Date: 02/03/2017  PT End of Session - 02/03/17 0802    Visit Number  3    Number of Visits  12    Date for PT Re-Evaluation  03/04/17    PT Start Time  0730    PT Stop Time  0802    PT Time Calculation (min)  32 min    Activity Tolerance  Patient tolerated treatment well    Behavior During Therapy  [facility] for tasks assessed/performed       Past Medical History:  Diagnosis Date  . Carpal tunnel syndrome, bilateral   . Carpal tunnel syndrome, right    nerve impingement right arm/wears brace  . Diabetes mellitus    no meds  . GERD (gastroesophageal reflux disease)   . Gout   . Hepatic steatosis   . Hyperlipidemia   . Hypertension   . Obstructive sleep apnea     Past Surgical History:  Procedure Laterality Date  . CARPAL TUNNEL RELEASE  09/10/2015   RIGHT WRIST / WITH NERVE IMPINGEMENT SURGERY  . CERVICAL FUSION     Dr Sherwood Gambler  . COLONOSCOPY  2007   negative; [facility]  . ESOPHAGEAL DILATION  2007  . 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 LUMBAR FUSION      Dr Sherwood Gambler    There were no vitals filed for this visit.  Subjective Assessment - 02/03/17 0738    Subjective  DN helped.  Yesterday pain increased to 8/10.  Sleeping ok.    Patient is accompained by:  Family member    Currently in Pain?  Yes    Pain Score  4  up to 8/10    Pain Location  Back    Pain Orientation  Left;Right    Pain Descriptors / Indicators  Aching;Burning;Numbness    Pain Type  Chronic pain    Pain Frequency  Constant    Aggravating Factors   leaning on left side  not sure    Pain Relieving Factors  shifting to the right with sitting.  meds    Multiple Pain Sites  -- Neck and knees sometimes.  OK today                      [facility] Adult PT Treatment/Exercise - 02/03/17 0001      Self-Care   Self-Care  ADL's practiced log roll and push to sit.     ADL's  Briefly discussed handout pointing out things he could change with home tasks.  Wife and patient were both interested and have many things to change.       Lumbar Exercises: Stretches   Passive Hamstring Stretch  3 reps;30 seconds    Pelvic Tilt  -- 10 x 5 seconds, cued for breathing, and 1 X 10 sec/breathing    Piriformis Stretch  3 reps;30 seconds cued to avoid twisting low back for better stretch      Lumbar Exercises: Supine   Clam  10 reps green band,  cued abd brace and breathing,  able to correctl    Bent Knee Raise  10 reps    Bridge  10 reps    Straight Leg Raise  10 reps right    Isometric Hip Flexion  5 reps;5 seconds right    Other Supine Lumbar Exercises  clam with ball squeeze 1 X 1 breah cued for pelvic floor activation and breathing    Other Supine Lumbar Exercises  Isometric hip flexion/ muscle energy right 5 X 5 seconds to level              PT Education - 02/03/17 0802    Education provided  Yes    Education Details  ADL    Person(s) Educated  Patient;Spouse    Methods  Explanation;Handout    Comprehension  Verbalized understanding          PT Long Term Goals - 02/03/17 0820      PT LONG TERM GOAL #1   Title  independent with HEP    Baseline  minor cues    Status  On-going      PT LONG TERM GOAL #2   Title  perform lumbar ROM without increase in pain    Status  Unable to assess      PT LONG TERM GOAL #3   Title  report ability to stand > 15 min without LLE symptoms or pain for improved function    Baseline  still increases Symptoms    Status  On-going      PT LONG TERM GOAL #4   Title  report pain < 3/10 with walking > 2 miles for improved function and mobility    Baseline  He says walking is more comfortable     Status  On-going            Plan - 02/03/17 0816    Clinical Impression Statement  Pain noted by patient to decrease after DN however it returned yesterday and was 8/10.  I have asked him to be more aware of when symptoms occur and attempt to change his ADL techniques.  Less pain noted at end of session.  "I feel better"  He was able to sit with equal weight shift on hips at end of session.  Progress toward HEP goals as patient is adherent with program.      PT Next Visit Plan   , review HEP, manual/modalities PRN, initiate core strengthening.  needs calf stretch,  consider bridging to HEP stretch/check hip flex/ quad tightness.     PT Home Exercise Plan  single knee to chest, piriformis stretching, hamstring stretch and SLR    Consulted and Agree with Plan of Care  Patient       Patient will benefit from skilled therapeutic intervention in order to improve the following deficits and impairments:     Visit Diagnosis: Chronic left-sided low back pain with left-sided sciatica  Myalgia  Abnormal posture     Problem List Patient Active Problem List   Diagnosis Date Noted  . Abdominal bloating 05/15/2015  . UTI (urinary tract infection) 04/20/2014  . Prostatitis 04/20/2014  . Hyperglycemia 04/20/2014  . Fatigue 03/07/2014  . Family history of early CAD 03/07/2014  . Maxillary sinusitis, acute 12/06/2013  . Tinea versicolor 12/06/2013  . Physical exam 11/06/2013  . 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 Depression 03/29/2013  . Other malaise and fatigue 03/29/2013  . Decreased libido 03/29/2013  . Hematuria 02/17/2013  . Right elbow pain 12/15/2012  . Pustular folliculitis 32/44/0102  . Left ankle pain 03/15/2012  . Hypertension associated with diabetes ([place]) 12/01/2011  . Renal insufficiency 12/01/2011  . SPINAL  STENOSIS, LUMBAR 08/02/2008  . Vitamin D deficiency 11/30/2007  . GOUT 11/30/2007  . BPH (benign prostatic hyperplasia) 11/30/2007  .  Diabetes mellitus with renal manifestation ([place]) 06/11/2006  . ANEMIA NEC 06/11/2006  . Chronic back pain greater than 3 months duration 06/11/2006  . Hyperlipidemia associated with type 2 diabetes mellitus ([place]) 05/26/2006  . OBSTRUCTIVE SLEEP APNEA 05/26/2006    HARRIS,KAREN  PTA 02/03/2017, 8:23 AM  [facility] [address] [place], [place], [address] Phone: [phone]   Fax:  [phone]  Name: [name] MRN: [id number] Date of Birth: 1955-02-08

## 2017-02-03 NOTE — Patient Instructions (Signed)

## 2017-02-05 ENCOUNTER — Ambulatory Visit: Payer: PPO | Attending: Family Medicine | Admitting: Physical Therapy

## 2017-02-05 ENCOUNTER — Encounter: Payer: Self-pay | Admitting: Physical Therapy

## 2017-02-05 DIAGNOSIS — G8929 Other chronic pain: Secondary | ICD-10-CM | POA: Diagnosis not present

## 2017-02-05 DIAGNOSIS — R293 Abnormal posture: Secondary | ICD-10-CM | POA: Diagnosis not present

## 2017-02-05 DIAGNOSIS — M791 Myalgia, unspecified site: Secondary | ICD-10-CM | POA: Diagnosis not present

## 2017-02-05 DIAGNOSIS — M5442 Lumbago with sciatica, left side: Secondary | ICD-10-CM | POA: Insufficient documentation

## 2017-02-05 NOTE — Therapy (Signed)
[place], [place], [address] Phone: [phone]   Fax:  [phone]  Physical Therapy Treatment  Patient Details  Name: [name] MRN: [id number] Date of Birth: 08-22-1955 Referring Provider: [name]   Encounter Date: 02/05/2017  PT End of Session - 02/05/17 1151    Visit Number  4    Number of Visits  12    Date for PT Re-Evaluation  03/04/17    Authorization Type  [employer]    PT Start Time  0845    PT Stop Time  0926    PT Time Calculation (min)  41 min    Activity Tolerance  Patient tolerated treatment well    Behavior During Therapy  [facility] for tasks assessed/performed       Past Medical History:  Diagnosis Date  . Carpal tunnel syndrome, bilateral   . Carpal tunnel syndrome, right    nerve impingement right arm/wears brace  . Diabetes mellitus    no meds  . GERD (gastroesophageal reflux disease)   . Gout   . Hepatic steatosis   . Hyperlipidemia   . Hypertension   . Obstructive sleep apnea     Past Surgical History:  Procedure Laterality Date  . CARPAL TUNNEL RELEASE  09/10/2015   RIGHT WRIST / WITH NERVE IMPINGEMENT SURGERY  . CERVICAL FUSION     Dr Sherwood Gambler  . COLONOSCOPY  2007   negative; [facility]  . ESOPHAGEAL DILATION  2007  . 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 LUMBAR FUSION      Dr Sherwood Gambler    There were no vitals filed for this visit.  Subjective Assessment - 02/05/17 1149    Subjective  Patient reports his pain is better. It is in his lateral hip tofday. He has been doing his stretches. His wife also reports that he is doing a rotational ab machine at the gym as well as a weighted flexion machine. he was advised to stop.     Patient is accompained by:  Family member    Pertinent History  hx 2 back surgeries (L5/S1 fusion, and ACDF)    Limitations  Standing;Walking    How long can you stand comfortably?   numbness occurs in 5-10 min    Diagnostic tests  none recently    Patient Stated Goals  improve pain; relieve numbness    Currently in Pain?  Yes    Pain Score  3     Pain Location  Back    Pain Orientation  Right;Left    Pain Descriptors / Indicators  Aching;Burning;Numbness    Pain Type  Chronic pain    Pain Onset  More than a month ago    Pain Frequency  Constant    Aggravating Factors   movements to the left side     Pain Relieving Factors  shifting to the right                       [facility] Adult PT Treatment/Exercise - 02/05/17 0001      Lumbar Exercises: Stretches   Passive Hamstring Stretch  3 reps;30 seconds    Piriformis Stretch  3 reps;30 seconds cued to avoid twisting low back for better stretch      Lumbar Exercises: Aerobic   Nustep  L5 x 5 min LE only      Lumbar Exercises: Machines for Strengthening  Leg Press  reviewed proper fitting of machine. Patient too tall for good fit. Reviewed squat instead     Other Lumbar Machine Exercise  row with core stability 2x10 each handle 325 lb; lat pull down 2x10 25 lb with cuing for core breathing; Reviewed how to do bicpes and triceps with core stability       Manual Therapy   Manual Therapy  Soft tissue mobilization;Muscle Energy Technique;Joint mobilization    Manual therapy comments  skilled palpation and monitoring throughout DN    Soft tissue mobilization  IASTM over L piriformis/ glute med       Trigger Point Dry Needling - 02/05/17 1156    Consent Given?  Yes    Education Handout Provided  Yes    Muscles Treated Upper Body  Longissimus;Quadratus Lumborum    Longissimus Response  -- good twitch    Gluteus Minimus Response  Twitch response elicited quadratus: no twitch            PT Education - 02/05/17 1151    Education provided  Yes    Education Details  reviewed HEP     Person(s) Educated  Patient    Methods  Explanation;Demonstration;Tactile cues;Verbal cues;Handout    Comprehension   Verbalized understanding;Returned demonstration;Verbal cues required;Tactile cues required          PT Long Term Goals - 02/03/17 0820      PT LONG TERM GOAL #1   Title  independent with HEP    Baseline  minor cues    Status  On-going      PT LONG TERM GOAL #2   Title  perform lumbar ROM without increase in pain    Status  Unable to assess      PT LONG TERM GOAL #3   Title  report ability to stand > 15 min without LLE symptoms or pain for improved function    Baseline  still increases Symptoms    Status  On-going      PT LONG TERM GOAL #4   Title  report pain < 3/10 with walking > 2 miles for improved function and mobility    Baseline  He says walking is more comfortable    Status  On-going            Plan - 02/05/17 1153    Clinical Impression Statement  Therapy perfromed trigger point dry needling of his glut medius and his quadratus. He had a good twitch response with the glut medius bout not a great repsonse with the quadratus. Therapy reviewed core stabilization exercises in the gym. He was adivsed to not perform weightted core exercise. He was also advised to not perfrom leg press machine that is too scrunched up     Clinical Presentation  Stable    Rehab Potential  Good    PT Frequency  2x / week    PT Duration  6 weeks    PT Treatment/Interventions  ADLs/Self Care Home Management;Cryotherapy;Electrical Stimulation;Moist Heat;Therapeutic exercise;Traction;Therapeutic activities;Functional mobility training;Stair training;Gait training;Ultrasound;Balance training;Neuromuscular re-education;Patient/family education;Manual techniques;Taping;Dry needling    PT Next Visit Plan   , review HEP, manual/modalities PRN, initiate core strengthening.  needs calf stretch,  consider bridging to HEP stretch/check hip flex/ quad tightness.     PT Home Exercise Plan  single knee to chest, piriformis stretching, hamstring stretch and SLR    Consulted and Agree with Plan of Care   Patient       Patient will benefit from skilled therapeutic intervention  in order to improve the following deficits and impairments:  Pain, Increased fascial restricitons, Increased muscle spasms, Postural dysfunction, Decreased mobility, Difficulty walking, Decreased strength  Visit Diagnosis: Chronic left-sided low back pain with left-sided sciatica  Myalgia  Abnormal posture     Problem List Patient Active Problem List   Diagnosis Date Noted  . Abdominal bloating 05/15/2015  . UTI (urinary tract infection) 04/20/2014  . Prostatitis 04/20/2014  . Hyperglycemia 04/20/2014  . Fatigue 03/07/2014  . Family history of early CAD 03/07/2014  . Maxillary sinusitis, acute 12/06/2013  . Tinea versicolor 12/06/2013  . Physical exam 11/06/2013  . 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 Depression 03/29/2013  . Other malaise and fatigue 03/29/2013  . Decreased libido 03/29/2013  . Hematuria 02/17/2013  . Right elbow pain 12/15/2012  . Pustular folliculitis 96/22/2979  . Left ankle pain 03/15/2012  . Hypertension associated with diabetes ([place]) 12/01/2011  . Renal insufficiency 12/01/2011  . SPINAL STENOSIS, LUMBAR 08/02/2008  . Vitamin D deficiency 11/30/2007  . GOUT 11/30/2007  . BPH (benign prostatic hyperplasia) 11/30/2007  . Diabetes mellitus with renal manifestation ([place]) 06/11/2006  . ANEMIA NEC 06/11/2006  . Chronic back pain greater than 3 months duration 06/11/2006  . Hyperlipidemia associated with type 2 diabetes mellitus ([place]) 05/26/2006  . OBSTRUCTIVE SLEEP APNEA 05/26/2006    Carney Living PT DPT  02/05/2017, 12:00 PM  [facility] [address] [place], [place], [address] Phone: [phone]   Fax:  [phone]  Name: [name] MRN: [id number] Date of Birth: 05/15/1955

## 2017-02-10 ENCOUNTER — Encounter: Payer: Self-pay | Admitting: Physical Therapy

## 2017-02-10 ENCOUNTER — Ambulatory Visit: Payer: PPO | Admitting: Physical Therapy

## 2017-02-10 DIAGNOSIS — M791 Myalgia, unspecified site: Secondary | ICD-10-CM

## 2017-02-10 DIAGNOSIS — G8929 Other chronic pain: Secondary | ICD-10-CM

## 2017-02-10 DIAGNOSIS — M5442 Lumbago with sciatica, left side: Principal | ICD-10-CM

## 2017-02-10 DIAGNOSIS — R293 Abnormal posture: Secondary | ICD-10-CM

## 2017-02-10 NOTE — Therapy (Signed)
[place], [place], [address] Phone: [phone]   Fax:  [phone]  Physical Therapy Treatment  Patient Details  Name: [name] MRN: [id number] Date of Birth: 11/20/1955 Referring Provider: [name]   Encounter Date: 02/10/2017  PT End of Session - 02/10/17 1626    Visit Number  5    Number of Visits  12    Date for PT Re-Evaluation  03/04/17    PT Start Time  6962    PT Stop Time  1625    PT Time Calculation (min)  38 min    Activity Tolerance  Patient tolerated treatment well    Behavior During Therapy  [facility] for tasks assessed/performed       Past Medical History:  Diagnosis Date  . Carpal tunnel syndrome, bilateral   . Carpal tunnel syndrome, right    nerve impingement right arm/wears brace  . Diabetes mellitus    no meds  . GERD (gastroesophageal reflux disease)   . Gout   . Hepatic steatosis   . Hyperlipidemia   . Hypertension   . Obstructive sleep apnea     Past Surgical History:  Procedure Laterality Date  . CARPAL TUNNEL RELEASE  09/10/2015   RIGHT WRIST / WITH NERVE IMPINGEMENT SURGERY  . CERVICAL FUSION     Dr Sherwood Gambler  . COLONOSCOPY  2007   negative; [facility]  . ESOPHAGEAL DILATION  2007  . 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 LUMBAR FUSION      Dr Sherwood Gambler    There were no vitals filed for this visit.  Subjective Assessment - 02/10/17 1551    Subjective  Pain 5/10   It spikes at times.  PT is helping some.    Exercises are going good.    Sleeping pretty good,, some restless times.  I try hard to sit straight.      Patient is accompained by:  Family member    Currently in Pain?  Yes    Pain Score  4     Pain Location  Back    Pain Orientation  Right;Left    Pain Descriptors / Indicators  Aching;Burning;Numbness    Pain Type  Chronic pain    Pain Frequency  Constant    Aggravating Factors   staying on it too long     Pain Relieving Factors    shifting tio the side is a habit i catch myself    Multiple Pain Sites  -- right arm,                      [facility] Adult PT Treatment/Exercise - 02/10/17 0001      Lumbar Exercises: Stretches   Passive Hamstring Stretch  3 reps;30 seconds    Piriformis Stretch  3 reps;30 seconds gentle to avoid pain    Other Lumbar Stretch Exercise  childs pose 10 X 1  and QL from child's pose 10 X 10 left side stretching.        Lumbar Exercises: Aerobic   Nustep  L5 x 5 min LE only      Lumbar Exercises: Supine   Bridge  10 reps    Large Ball Abdominal Isometric  10 reps both and single  10 X each    Other Supine Lumbar Exercises  ball squeeze 10 x    Other Supine Lumbar Exercises  decompression 5 X 5 seconds  leg press each side      Lumbar Exercises: Quadruped   [place]/Old Horse  5 reps    Single Arm Raise  5 reps    Straight Leg Raise  5 reps    Opposite Arm/Leg Raise  5 reps cued for technique  neutral spine             PT Education - 02/10/17 1625    Education provided  Yes    Education Details  exercise techniques,      Person(s) Educated  Patient    Methods  Explanation;Tactile cues;Verbal cues    Comprehension  Verbalized understanding;Returned demonstration          PT Long Term Goals - 02/10/17 1630      PT LONG TERM GOAL #1   Title  independent with HEP    Baseline  minor cues    Period  Weeks    Status  On-going      PT LONG TERM GOAL #2   Title  perform lumbar ROM without increase in pain    Baseline  able to do in gym  with some of the exercises    Period  Weeks    Status  Partially Met      PT LONG TERM GOAL #3   Title  report ability to stand > 15 min without LLE symptoms or pain for improved function    Baseline  10-15 minute limit  prior to symptoms    Period  Weeks    Status  On-going      PT LONG TERM GOAL #4   Title  report pain < 3/10 with walking > 2 miles for improved function and mobility     Baseline  2 miles of walking with less than 3/10 pain    Period  Weeks    Status  Achieved            Plan - 02/10/17 1627    Clinical Impression Statement  No pain at end of session.  Patient was able to walk 2 miles with less than 3/10 pain .  LTG#4 met.  LTG#2 partially met.  Working toward ROM goals with exercise.  Patient declined the need of modalities.      PT Next Visit Plan   , review HEP, manual/modalities PRN, initiate core strengthening.  needs calf stretch,  consider bridging to HEP stretch/check hip flex/ quad tightness. Quadripes strtch and strength    PT Home Exercise Plan  single knee to chest, piriformis stretching, hamstring stretch and SLR    Consulted and Agree with Plan of Care  Patient       Patient will benefit from skilled therapeutic intervention in order to improve the following deficits and impairments:     Visit Diagnosis: Chronic left-sided low back pain with left-sided sciatica  Myalgia  Abnormal posture     Problem List Patient Active Problem List   Diagnosis Date Noted  . Abdominal bloating 05/15/2015  . UTI (urinary tract infection) 04/20/2014  . Prostatitis 04/20/2014  . Hyperglycemia 04/20/2014  . Fatigue 03/07/2014  . Family history of early CAD 03/07/2014  . Maxillary sinusitis, acute 12/06/2013  . Tinea versicolor 12/06/2013  . Physical exam 11/06/2013  . 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 Depression 03/29/2013  . Other malaise and fatigue 03/29/2013  . Decreased libido 03/29/2013  . Hematuria 02/17/2013  . Right elbow pain 12/15/2012  . Pustular folliculitis 09/28/2012  . Left ankle pain 03/15/2012  .   Hypertension associated with diabetes ([facility]) 12/01/2011  . Renal insufficiency 12/01/2011  . SPINAL STENOSIS, LUMBAR 08/02/2008  . Vitamin D deficiency 11/30/2007  . GOUT 11/30/2007  . BPH (benign prostatic hyperplasia) 11/30/2007  . Diabetes mellitus with renal manifestation ([facility]) 06/11/2006  .  ANEMIA NEC 06/11/2006  . Chronic back pain greater than 3 months duration 06/11/2006  . Hyperlipidemia associated with type 2 diabetes mellitus ([facility]) 05/26/2006  . OBSTRUCTIVE SLEEP APNEA 05/26/2006    , PTA 02/10/2017, 4:33 PM  [facility] [address] [place], [place], [address] Phone: [phone]   Fax:  [phone]  Name: [name] MRN: [id number] Date of Birth: 02/06/1955   

## 2017-02-11 ENCOUNTER — Encounter: Payer: Self-pay | Admitting: Family Medicine

## 2017-02-11 ENCOUNTER — Ambulatory Visit: Payer: PPO | Admitting: Family Medicine

## 2017-02-11 DIAGNOSIS — M5442 Lumbago with sciatica, left side: Secondary | ICD-10-CM

## 2017-02-11 DIAGNOSIS — G8929 Other chronic pain: Secondary | ICD-10-CM | POA: Diagnosis not present

## 2017-02-11 MED ORDER — GABAPENTIN 800 MG PO TABS: 800.0000 mg | ORAL_TABLET | Freq: Three times a day (TID) | ORAL | 2 refills | Status: DC

## 2017-02-11 NOTE — Progress Notes (Signed)
PCP: Mellody Dance, DO  Subjective:   HPI: Patient is a 62 y.o. male here for low back pain.  12/6: Patient reports having had two separate back surgeries and noted only about 3 months of benefit beyond these. Current pain is worse the past 3  Months without new injury or trauma. Pain level 4/10 but up to 10/10 and sharp at times. Feels on left side though can radiate down to left knee with numbness. Worse with standing. Takes ibuprofen. Was on lyrica but now on gabapentin. Also had injection before which helped some. No bowel/bladder dysfunction. + night pain and difficulty getting comfortable.  01/11/17: Patient reports he feels about the same as last visit. He hasn't heard from physical therapy. Is taking the higher dose of gabapentin, 300 tid. Pain up to 4/10 currently and his left leg will go numb if up on it a lot. Worse with sitting also. No bowel/bladder dysfunction.  2/7: Patient reports he's doing well. Has done 5 visits of PT and is doing home exercises. Working out at a gym too and has lost about 15 pounds. Still with pain in left side of back that goes to left leg about the knee. Pain level 2/10, more dull. Some associated numbness. Improved with the gabapentin 600 tid. No bowel/bladder dysfunction.  Past Medical History:  Diagnosis Date  . Carpal tunnel syndrome, bilateral   . Carpal tunnel syndrome, right    nerve impingement right arm/wears brace  . Diabetes mellitus    no meds  . GERD (gastroesophageal reflux disease)   . Gout   . Hepatic steatosis   . Hyperlipidemia   . Hypertension   . Obstructive sleep apnea     Current Outpatient Medications on File Prior to Visit  Medication Sig Dispense Refill  . allopurinol (ZYLOPRIM) 300 MG tablet TAKE 1/2 TABLET BY MOUTH DAILY 45 tablet 3  . amLODipine (NORVASC) 10 MG tablet TAKE 1/2 TABLET BY MOUTH THREE TIMES PER WEEK 18 tablet 6  . Ascorbic Acid (VITAMIN C) 1000 MG tablet Take 1,000 mg by mouth  daily.    Marland Kitchen aspirin 81 MG tablet Take 81 mg by mouth daily.      Marland Kitchen atorvastatin (LIPITOR) 20 MG tablet     . benazepril (LOTENSIN) 20 MG tablet TAKE 1 TABLET BY MOUTH DAILY 90 tablet 1  . blood glucose meter kit and supplies Dispense based on patient and insurance preference. Use to check fasting blood glucose in the morning and to check glucose 2 hours after largest meal of the day. (FOR ICD-10 E10.9, E11.9). 1 each 0  . FLUoxetine (PROZAC) 40 MG capsule TAKE 1 CAPSULE BY MOUTH DAILY 90 capsule 1  . fluticasone (FLONASE) 50 MCG/ACT nasal spray Place 2 sprays into both nostrils daily. 16 g 6  . glucose blood test strip Use as instructed 100 each 12  . LANCETS ULTRA FINE MISC Use to check fasting blood glucose in the morning and to check glucose 2 hours after largest meal of the day 100 each 12  . Multiple Vitamins-Iron (MULTIVITAMINS WITH IRON) TABS Take 1 tablet by mouth daily. 30 tablet 0  . MYRBETRIQ 25 MG TB24 tablet     . pantoprazole (PROTONIX) 40 MG tablet TAKE 1 TABLET BY MOUTH DAILY 90 tablet 1  . polyethylene glycol powder (GLYCOLAX/MIRALAX) powder USE AS DIRECTED 850 g 11  . pyridoxine (B-6) 100 MG tablet Take 100 mg by mouth daily.    . traZODone (DESYREL) 50 MG tablet TAKE 1/2 TO  1 TABLET BY MOUTH EVERY NIGHT AT BEDTIME AS NEEDED FOR SLEEP 30 tablet 6  . Vitamin D, Ergocalciferol, (DRISDOL) 50000 units CAPS capsule Take 1 capsule (50,000 Units total) by mouth every 7 (seven) days. 12 capsule 10   No current facility-administered medications on file prior to visit.     Past Surgical History:  Procedure Laterality Date  . CARPAL TUNNEL RELEASE  09/10/2015   RIGHT WRIST / WITH NERVE IMPINGEMENT SURGERY  . CERVICAL FUSION     Dr Sherwood Gambler  . COLONOSCOPY  2007   negative; [facility]  . ESOPHAGEAL DILATION  2007  . 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 LUMBAR FUSION      Dr Sherwood Gambler    Allergies  Allergen Reactions  . Prednisone Other (See  Comments)    Caused blood sugar to increase, pt will not take    Social History   Socioeconomic History  . Marital status: Married    Spouse name: Not on file  . Number of children: Not on file  . Years of education: Not on file  . Highest education level: Not on file  Social Needs  . Financial resource strain: Not on file  . Food insecurity - worry: Not on file  . Food insecurity - inability: Not on file  . Transportation needs - medical: Not on file  . Transportation needs - non-medical: Not on file  Occupational History  . Not on file  Tobacco Use  . Smoking status: Former Smoker    Last attempt to quit: 01/06/1976    Years since quitting: 41.1  . Smokeless tobacco: Never Used  . Tobacco comment: smoked 35 years ago as of 2013   Substance and Sexual Activity  . Alcohol use: No  . Drug use: No  . Sexual activity: Yes    Birth control/protection: None  Other Topics Concern  . Not on file  Social History Narrative  . Not on file    Family History  Problem Relation Age of Onset  . Heart disease Father        pacer  . Urolithiasis Father   . Heart attack Father   . Aneurysm Mother 67       CNS aneurysm  . Sudden death Mother   . Diabetes Sister   . Cancer Maternal Uncle        ? primary  . Cancer Paternal Uncle        ? primary  . Kidney disease Brother   . Heart disease Sister   . Heart disease Sister   . Hyperlipidemia Neg Hx   . Hypertension Neg Hx   . Colon cancer Neg Hx     BP 116/72   Pulse 64   Ht 6' (1.829 m)   Wt 240 lb (108.9 kg)   BMI 32.55 kg/m   Review of Systems: See HPI above.     Objective:  Physical Exam:  Gen: NAD, comfortable in exam room.  Back: No gross deformity, scoliosis. No TTP .  No midline or bony TTP. FROM without pain. Strength LEs 5/5 all muscle groups.   1+ MSRs in patellar and achilles tendons, equal bilaterally. Negative SLRs. Sensation intact to light touch bilaterally.  Assessment & Plan:  1. Low back  pain with radiation into left leg - 2/2 radiculopathy.  Known advanced arthritis, s/p 2 surgeries.  Doing well with physical therapy, home exercises, gabapentin.  Will increase gabapentin to 800 tid.  F/u in 6 weeks.

## 2017-02-11 NOTE — Patient Instructions (Addendum)
Continue the physical therapy and home exercises. Increase the gabapentin to 800mg  three times a day. Follow up with me in 6 weeks.

## 2017-02-11 NOTE — Assessment & Plan Note (Signed)
>>  ASSESSMENT AND PLAN FOR CHRONIC LOW BACK PAIN- W LUMBAR RADICULOPATHY WRITTEN ON 02/11/2017  9:17 PM BY HUDNALL, Azucena Fallen, MD  2/2 radiculopathy.  Known advanced arthritis, s/p 2 surgeries.  Doing well with physical therapy, home exercises, gabapentin.  Will increase gabapentin to 800 tid.  F/u in 6 weeks.

## 2017-02-11 NOTE — Assessment & Plan Note (Signed)
2/2 radiculopathy.  Known advanced arthritis, s/p 2 surgeries.  Doing well with physical therapy, home exercises, gabapentin.  Will increase gabapentin to 800 tid.  F/u in 6 weeks.

## 2017-02-12 ENCOUNTER — Ambulatory Visit: Payer: PPO | Admitting: Physical Therapy

## 2017-02-12 ENCOUNTER — Encounter: Payer: Self-pay | Admitting: Physical Therapy

## 2017-02-12 DIAGNOSIS — M791 Myalgia, unspecified site: Secondary | ICD-10-CM

## 2017-02-12 DIAGNOSIS — M5442 Lumbago with sciatica, left side: Principal | ICD-10-CM

## 2017-02-12 DIAGNOSIS — G8929 Other chronic pain: Secondary | ICD-10-CM

## 2017-02-12 DIAGNOSIS — R293 Abnormal posture: Secondary | ICD-10-CM

## 2017-02-14 ENCOUNTER — Encounter: Payer: Self-pay | Admitting: Physical Therapy

## 2017-02-14 NOTE — Therapy (Signed)
[place], [place], [address] Phone: [phone]   Fax:  [phone]  Physical Therapy Treatment  Patient Details  Name: [name] MRN: [id number] Date of Birth: 1955/09/07 Referring Provider: [name]   Encounter Date: 02/12/2017  PT End of Session - 02/14/17 2042    Visit Number  6    Number of Visits  12    Date for PT Re-Evaluation  03/04/17    Authorization Type  [employer]    PT Start Time  0930    PT Stop Time  1014    PT Time Calculation (min)  44 min    Activity Tolerance  Patient tolerated treatment well    Behavior During Therapy  [facility] for tasks assessed/performed       Past Medical History:  Diagnosis Date  . Carpal tunnel syndrome, bilateral   . Carpal tunnel syndrome, right    nerve impingement right arm/wears brace  . Diabetes mellitus    no meds  . GERD (gastroesophageal reflux disease)   . Gout   . Hepatic steatosis   . Hyperlipidemia   . Hypertension   . Obstructive sleep apnea     Past Surgical History:  Procedure Laterality Date  . CARPAL TUNNEL RELEASE  09/10/2015   RIGHT WRIST / WITH NERVE IMPINGEMENT SURGERY  . CERVICAL FUSION     Dr Sherwood Gambler  . COLONOSCOPY  2007   negative; [facility]  . ESOPHAGEAL DILATION  2007  . 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 LUMBAR FUSION      Dr Sherwood Gambler    There were no vitals filed for this visit.                   [facility] Adult PT Treatment/Exercise - 02/14/17 0001      Lumbar Exercises: Stretches   Passive Hamstring Stretch  3 reps;30 seconds    Piriformis Stretch  3 reps;30 seconds gentle to avoid pain      Lumbar Exercises: Standing   Other Standing Lumbar Exercises  standing low range chop 2x10 bilateral     Other Standing Lumbar Exercises  pallof press 2x10 bilateral       Manual Therapy   Manual Therapy  Soft tissue mobilization;Muscle Energy  Technique;Joint mobilization    Manual therapy comments  skilled palpation and monitoring throughout DN    Soft tissue mobilization  IASTM over L piriformis/ glute med       Trigger Point Dry Needling - 02/14/17 2052    Consent Given?  Yes    Education Handout Provided  Yes    Longissimus Response  Twitch response elicited    Gluteus Minimus Response  Twitch response elicited           PT Education - 02/14/17 2042    Education provided  Yes    Education Details  updated HEP     Person(s) Educated  Patient    Methods  Explanation;Tactile cues;Verbal cues    Comprehension  Verbalized understanding;Returned demonstration;Verbal cues required;Tactile cues required          PT Long Term Goals - 02/10/17 1630      PT LONG TERM GOAL #1   Title  independent with HEP    Baseline  minor cues    Period  Weeks    Status  On-going      PT LONG TERM GOAL #2   Title  perform lumbar ROM without increase in pain    Baseline  able to do in gym  with some of the exercises    Period  Weeks    Status  Partially Met      PT LONG TERM GOAL #3   Title  report ability to stand > 15 min without LLE symptoms or pain for improved function    Baseline  10-15 minute limit  prior to symptoms    Period  Weeks    Status  On-going      PT LONG TERM GOAL #4   Title  report pain < 3/10 with walking > 2 miles for improved function and mobility    Baseline  2 miles of walking with less than 3/10 pain    Period  Weeks    Status  Achieved            Plan - 02/14/17 2047    Clinical Impression Statement  Therapy reviewed diagonal strengthening and light isometric rotation exercises. He had no increased in pain. He is doing well with his stretches and exercises. He would benefit from a few more sessions of needling but is likley progressing towards being independent with HEP and D/C to HEP.     Clinical Presentation  Stable    Clinical Decision Making  Low    Rehab Potential  Good    PT  Frequency  2x / week    PT Duration  6 weeks    PT Treatment/Interventions  ADLs/Self Care Home Management;Cryotherapy;Electrical Stimulation;Moist Heat;Therapeutic exercise;Traction;Therapeutic activities;Functional mobility training;Stair training;Gait training;Ultrasound;Balance training;Neuromuscular re-education;Patient/family education;Manual techniques;Taping;Dry needling    PT Next Visit Plan   , review HEP, manual/modalities PRN, initiate core strengthening.  needs calf stretch,  consider bridging to HEP stretch/check hip flex/ quad tightness. Quadripes strtch and strength    PT Home Exercise Plan  single knee to chest, piriformis stretching, hamstring stretch and SLR    Consulted and Agree with Plan of Care  Patient       Patient will benefit from skilled therapeutic intervention in order to improve the following deficits and impairments:  Pain, Increased fascial restricitons, Increased muscle spasms, Postural dysfunction, Decreased mobility, Difficulty walking, Decreased strength  Visit Diagnosis: Chronic left-sided low back pain with left-sided sciatica  Myalgia  Abnormal posture     Problem List Patient Active Problem List   Diagnosis Date Noted  . Abdominal bloating 05/15/2015  . UTI (urinary tract infection) 04/20/2014  . Prostatitis 04/20/2014  . Hyperglycemia 04/20/2014  . Fatigue 03/07/2014  . Family history of early CAD 03/07/2014  . Maxillary sinusitis, acute 12/06/2013  . Tinea versicolor 12/06/2013  . Physical exam 11/06/2013  . 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 Depression 03/29/2013  . Other malaise and fatigue 03/29/2013  . Decreased libido 03/29/2013  . Hematuria 02/17/2013  . Right elbow pain 12/15/2012  . Pustular folliculitis 75/64/3329  . Left ankle pain 03/15/2012  . Hypertension associated with diabetes ([place]) 12/01/2011  . Renal insufficiency 12/01/2011  . SPINAL STENOSIS, LUMBAR 08/02/2008  . Vitamin D deficiency  11/30/2007  . GOUT 11/30/2007  . BPH (benign prostatic hyperplasia) 11/30/2007  . Diabetes mellitus with renal manifestation ([place]) 06/11/2006  . ANEMIA NEC 06/11/2006  . Chronic back pain greater than 3 months duration 06/11/2006  . Hyperlipidemia associated with type 2 diabetes mellitus ([place]) 05/26/2006  . OBSTRUCTIVE SLEEP APNEA 05/26/2006    Carney Living  PT DPT  02/14/2017, 8:56 PM  [facility] [address] [place], [place], [address] Phone: [phone]   Fax:  [phone]  Name: [name] MRN: [id number] Date of Birth: 16-Jan-1955

## 2017-02-15 DIAGNOSIS — H903 Sensorineural hearing loss, bilateral: Secondary | ICD-10-CM | POA: Diagnosis not present

## 2017-02-16 ENCOUNTER — Encounter: Payer: Self-pay | Admitting: Physical Therapy

## 2017-02-16 ENCOUNTER — Ambulatory Visit: Payer: PPO | Admitting: Physical Therapy

## 2017-02-16 DIAGNOSIS — M5442 Lumbago with sciatica, left side: Principal | ICD-10-CM

## 2017-02-16 DIAGNOSIS — M791 Myalgia, unspecified site: Secondary | ICD-10-CM

## 2017-02-16 DIAGNOSIS — R293 Abnormal posture: Secondary | ICD-10-CM

## 2017-02-16 DIAGNOSIS — G8929 Other chronic pain: Secondary | ICD-10-CM

## 2017-02-16 NOTE — Therapy (Signed)
[place], [place], [address] Phone: [phone]   Fax:  [phone]  Physical Therapy Treatment  Patient Details  Name: [name] MRN: [id number] Date of Birth: Oct 29, 1955 Referring Provider: [name]   Encounter Date: 02/16/2017  PT End of Session - 02/16/17 1726    Visit Number  7    Number of Visits  12    Date for PT Re-Evaluation  03/04/17    PT Start Time  1633    PT Stop Time  1714    PT Time Calculation (min)  41 min    Activity Tolerance  Patient tolerated treatment well    Behavior During Therapy  [facility] for tasks assessed/performed       Past Medical History:  Diagnosis Date  . Carpal tunnel syndrome, bilateral   . Carpal tunnel syndrome, right    nerve impingement right arm/wears brace  . Diabetes mellitus    no meds  . GERD (gastroesophageal reflux disease)   . Gout   . Hepatic steatosis   . Hyperlipidemia   . Hypertension   . Obstructive sleep apnea     Past Surgical History:  Procedure Laterality Date  . CARPAL TUNNEL RELEASE  09/10/2015   RIGHT WRIST / WITH NERVE IMPINGEMENT SURGERY  . CERVICAL FUSION     Dr Sherwood Gambler  . COLONOSCOPY  2007   negative; [facility]  . ESOPHAGEAL DILATION  2007  . 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 LUMBAR FUSION      Dr Sherwood Gambler    There were no vitals filed for this visit.  Subjective Assessment - 02/16/17 1636    Subjective  Pain 2/10.  Leg gets numb witrh standing ,  so i sit down                      [facility] Adult PT Treatment/Exercise - 02/16/17 0001      Lumbar Exercises: Stretches   Passive Hamstring Stretch  3 reps;30 seconds    Piriformis Stretch  2 reps;30 seconds AA    Other Lumbar Stretch Exercise  Child's pose 3 positions 10-15 seconds hold each      Lumbar Exercises: Machines for Strengthening   Leg Press  2,plates X 14,2 plates X 30 monitored for neutral  spine      Lumbar Exercises: Supine   Clam  10 reps left only    Single Leg Bridge  10 reps each    Other Supine Lumbar Exercises  sit to stand 10 X to light touchdown to mat arms at 90.    Other Supine Lumbar Exercises  ball stabilization and pelvic mobility static and dynamism  closr SBA      Lumbar Exercises: Prone   Other Prone Lumbar Exercises  multifitus press 5 X cues/ palpation for activation/ relax.  Press with knee flexion 5 X alternating.  Post session patient noted nerve pain into leg vs painful sx.  he will nform of sx in future.       Lumbar Exercises: Quadruped   Plank  5 X from elbow and knees  each side,  5 X with elbows/ knees and abd contraction, and in quadriped walking hands out hold 5 seconds and walk back 5 X.             PT Education - 02/16/17 1726    Education provided  Yes    Education Details  Avoid radiating Sx even if not painful    Person(s) Educated  Patient;Spouse    Methods  Explanation    Comprehension  Verbalized understanding          PT Long Term Goals - 02/16/17 1730      PT LONG TERM GOAL #1   Title  independent with HEP    Baseline  adherent so far,  no questions about them today    Period  Weeks    Status  On-going      PT LONG TERM GOAL #2   Title  perform lumbar ROM without increase in pain    Baseline  not painwith these in gym today    Period  Weeks    Status  Partially Met      PT LONG TERM GOAL #3   Title  report ability to stand > 15 min without LLE symptoms or pain for improved function    Baseline  10-15 minute limit  prior to symptoms    Period  Weeks    Status  On-going      PT LONG TERM GOAL #4   Title  report pain < 3/10 with walking > 2 miles for improved function and mobility    Period  Weeks    Status  Achieved            Plan - 02/16/17 1727    Clinical Impression Statement  Patient was able to progress to more challanging exercises with 1 X sx radiating into leg (Multifitus press with knee  flexion)  Patient ale to hold a partial plank 5 seconds.    PT Next Visit Plan   , review HEP, manual/modalities PRN, initiate core strengthening.  needs calf stretch,  consider bridging to HEP stretch/check hip flex/ quad tightness. Assess ex from last visit and repeat if helpful.    PT Home Exercise Plan  single knee to chest, piriformis stretching, hamstring stretch and SLR    Consulted and Agree with Plan of Care  Patient       Patient will benefit from skilled therapeutic intervention in order to improve the following deficits and impairments:     Visit Diagnosis: Chronic left-sided low back pain with left-sided sciatica  Myalgia  Abnormal posture     Problem List Patient Active Problem List   Diagnosis Date Noted  . Abdominal bloating 05/15/2015  . UTI (urinary tract infection) 04/20/2014  . Prostatitis 04/20/2014  . Hyperglycemia 04/20/2014  . Fatigue 03/07/2014  . Family history of early CAD 03/07/2014  . Maxillary sinusitis, acute 12/06/2013  . Tinea versicolor 12/06/2013  . Physical exam 11/06/2013  . 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 Depression 03/29/2013  . Other malaise and fatigue 03/29/2013  . Decreased libido 03/29/2013  . Hematuria 02/17/2013  . Right elbow pain 12/15/2012  . Pustular folliculitis 61/95/0932  . Left ankle pain 03/15/2012  . Hypertension associated with diabetes ([place]) 12/01/2011  . Renal insufficiency 12/01/2011  . SPINAL STENOSIS, LUMBAR 08/02/2008  . Vitamin D deficiency 11/30/2007  . GOUT 11/30/2007  . BPH (benign prostatic hyperplasia) 11/30/2007  . Diabetes mellitus with renal manifestation ([place]) 06/11/2006  . ANEMIA NEC 06/11/2006  . Chronic back pain greater than 3 months duration 06/11/2006  . Hyperlipidemia associated with type 2 diabetes mellitus ([place]) 05/26/2006  . OBSTRUCTIVE SLEEP APNEA 05/26/2006    HARRIS,KAREN PTA 02/16/2017, 5:32 PM  [facility] [address] [place], [place], [address]  Phone: [phone]   Fax:  [phone]  Name: [name] MRN: [id number] Date of Birth: [birth year]

## 2017-02-18 ENCOUNTER — Ambulatory Visit: Payer: PPO | Admitting: Physical Therapy

## 2017-02-18 ENCOUNTER — Encounter: Payer: Self-pay | Admitting: Physical Therapy

## 2017-02-18 DIAGNOSIS — M791 Myalgia, unspecified site: Secondary | ICD-10-CM

## 2017-02-18 DIAGNOSIS — R293 Abnormal posture: Secondary | ICD-10-CM

## 2017-02-18 DIAGNOSIS — M5442 Lumbago with sciatica, left side: Secondary | ICD-10-CM | POA: Diagnosis not present

## 2017-02-18 DIAGNOSIS — G8929 Other chronic pain: Secondary | ICD-10-CM

## 2017-02-18 NOTE — Therapy (Signed)
[place], [place], [address] Phone: [phone]   Fax:  [phone]  Physical Therapy Treatment  Patient Details  Name: [name] MRN: [id number] Date of Birth: 12-21-1955 Referring Provider: [name]   Encounter Date: 02/18/2017  PT End of Session - 02/18/17 1623    Visit Number  8    Number of Visits  12    Date for PT Re-Evaluation  03/04/17    Authorization Type  [employer]    PT Start Time  2992    PT Stop Time  1620    PT Time Calculation (min)  39 min    Activity Tolerance  Patient tolerated treatment well    Behavior During Therapy  [facility] for tasks assessed/performed       Past Medical History:  Diagnosis Date  . Carpal tunnel syndrome, bilateral   . Carpal tunnel syndrome, right    nerve impingement right arm/wears brace  . Diabetes mellitus    no meds  . GERD (gastroesophageal reflux disease)   . Gout   . Hepatic steatosis   . Hyperlipidemia   . Hypertension   . Obstructive sleep apnea     Past Surgical History:  Procedure Laterality Date  . CARPAL TUNNEL RELEASE  09/10/2015   RIGHT WRIST / WITH NERVE IMPINGEMENT SURGERY  . CERVICAL FUSION     Dr Sherwood Gambler  . COLONOSCOPY  2007   negative; [facility]  . ESOPHAGEAL DILATION  2007  . 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 LUMBAR FUSION      Dr Sherwood Gambler    There were no vitals filed for this visit.  Subjective Assessment - 02/18/17 1541    Subjective  "hanging in there." doing better, still having some pain and numbness into leg.  wants to defer needling today and consider at next available session.    Patient Stated Goals  improve pain; relieve numbness    Currently in Pain?  Yes    Pain Score  2     Pain Location  Back    Pain Orientation  Right;Left    Pain Descriptors / Indicators  Aching;Burning;Numbness    Pain Type  Chronic pain    Pain Radiating Towards  LLE  to knee    Pain Onset  More than a month ago    Pain Frequency  Constant    Aggravating Factors   standing    Pain Relieving Factors  shifting to the side                      [facility] Adult PT Treatment/Exercise - 02/18/17 1546      Lumbar Exercises: Stretches   Passive Hamstring Stretch  Right;Left;2 reps;30 seconds    Single Knee to Chest Stretch  Right;Left;3 reps;20 seconds    Piriformis Stretch  Right;Left;2 reps;30 seconds      Lumbar Exercises: Aerobic   Nustep  L5 x 6 min      Lumbar Exercises: Supine   Pelvic Tilt  10 reps;5 seconds    Clam  10 reps with PPT; alternating; green theraband    Bridge  10 reps maintaining neutral hips with green theraband for resistance      Lumbar Exercises: Prone   Straight Leg Raise  10 reps    Other Prone Lumbar Exercises  multifidi press  10 x 5 sec      Manual Therapy  Manual Therapy  Soft tissue mobilization    Soft tissue mobilization  IASTM over L piriformis/ glute med                  PT Long Term Goals - 02/16/17 1730      PT LONG TERM GOAL #1   Title  independent with HEP    Baseline  adherent so far,  no questions about them today    Period  Weeks    Status  On-going      PT LONG TERM GOAL #2   Title  perform lumbar ROM without increase in pain    Baseline  not painwith these in gym today    Period  Weeks    Status  Partially Met      PT LONG TERM GOAL #3   Title  report ability to stand > 15 min without LLE symptoms or pain for improved function    Baseline  10-15 minute limit  prior to symptoms    Period  Weeks    Status  On-going      PT LONG TERM GOAL #4   Title  report pain < 3/10 with walking > 2 miles for improved function and mobility    Period  Weeks    Status  Achieved            Plan - 02/18/17 1623    Clinical Impression Statement  Pt reports PT has been helpful at improving functional mobility and managing pain.  Pt nearing end of POC and will likely be ready to  transition to HEP next 2 weeks.  Numbness has been present since initial surgery (> 10 years ago) so unlikely to resolve this.      PT Treatment/Interventions  ADLs/Self Care Home Management;Cryotherapy;Electrical Stimulation;Moist Heat;Therapeutic exercise;Traction;Therapeutic activities;Functional mobility training;Stair training;Gait training;Ultrasound;Balance training;Neuromuscular re-education;Patient/family education;Manual techniques;Taping;Dry needling    PT Next Visit Plan   , review HEP, manual/modalities PRN, initiate core strengthening.  needs calf stretch,  consider bridging to HEP stretch/check hip flex/ quad tightness. Assess ex from last visit and repeat if helpful.    Consulted and Agree with Plan of Care  Patient       Patient will benefit from skilled therapeutic intervention in order to improve the following deficits and impairments:  Pain, Increased fascial restricitons, Increased muscle spasms, Postural dysfunction, Decreased mobility, Difficulty walking, Decreased strength  Visit Diagnosis: Chronic left-sided low back pain with left-sided sciatica  Myalgia  Abnormal posture     Problem List Patient Active Problem List   Diagnosis Date Noted  . Abdominal bloating 05/15/2015  . UTI (urinary tract infection) 04/20/2014  . Prostatitis 04/20/2014  . Hyperglycemia 04/20/2014  . Fatigue 03/07/2014  . Family history of early CAD 03/07/2014  . Maxillary sinusitis, acute 12/06/2013  . Tinea versicolor 12/06/2013  . Physical exam 11/06/2013  . 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 Depression 03/29/2013  . Other malaise and fatigue 03/29/2013  . Decreased libido 03/29/2013  . Hematuria 02/17/2013  . Right elbow pain 12/15/2012  . Pustular folliculitis 09/38/1829  . Left ankle pain 03/15/2012  . Hypertension associated with diabetes ([place]) 12/01/2011  . Renal insufficiency 12/01/2011  . SPINAL STENOSIS, LUMBAR 08/02/2008  . Vitamin D  deficiency 11/30/2007  . GOUT 11/30/2007  . BPH (benign prostatic hyperplasia) 11/30/2007  . Diabetes mellitus with renal manifestation ([place]) 06/11/2006  . ANEMIA NEC 06/11/2006  . Chronic back pain greater than 3 months duration 06/11/2006  .  Hyperlipidemia associated with type 2 diabetes mellitus ([place]) 05/26/2006  . OBSTRUCTIVE SLEEP APNEA 05/26/2006      Laureen Abrahams, PT, DPT 02/18/17 4:27 PM     [facility] [address] [place], [place], [address] Phone: [phone]   Fax:  [phone]  Name: [name] MRN: [id number] Date of Birth: 13-Oct-1955

## 2017-02-22 DIAGNOSIS — G4733 Obstructive sleep apnea (adult) (pediatric): Secondary | ICD-10-CM | POA: Diagnosis not present

## 2017-02-24 ENCOUNTER — Other Ambulatory Visit: Payer: Self-pay

## 2017-02-24 ENCOUNTER — Ambulatory Visit: Payer: PPO | Admitting: Physical Therapy

## 2017-02-24 ENCOUNTER — Encounter: Payer: Self-pay | Admitting: Physical Therapy

## 2017-02-24 DIAGNOSIS — M791 Myalgia, unspecified site: Secondary | ICD-10-CM

## 2017-02-24 DIAGNOSIS — G8929 Other chronic pain: Secondary | ICD-10-CM

## 2017-02-24 DIAGNOSIS — M5442 Lumbago with sciatica, left side: Principal | ICD-10-CM

## 2017-02-24 DIAGNOSIS — R293 Abnormal posture: Secondary | ICD-10-CM

## 2017-02-24 NOTE — Telephone Encounter (Signed)
Pharmacy sent over refill request for Amlodipine 10 mg.  Reviewed chart medication was last filled by a previous provider. Patient was last seen by Dr. Raliegh Scarlet on 01/12/17. Sent request to Dr. Raliegh Scarlet for review. MPulliam, CMA/RT(R)

## 2017-02-24 NOTE — Therapy (Signed)
[place], [place], [address] Phone: [phone]   Fax:  [phone]  Physical Therapy Treatment  Patient Details  Name: [name] MRN: [id number] Date of Birth: 1955-01-18 Referring Provider: [name]   Encounter Date: 02/24/2017  PT End of Session - 02/24/17 1727    Visit Number  9    Number of Visits  12    Date for PT Re-Evaluation  03/04/17    PT Start Time  1632    PT Stop Time  1717    PT Time Calculation (min)  45 min    Activity Tolerance  Patient tolerated treatment well    Behavior During Therapy  [facility] for tasks assessed/performed       Past Medical History:  Diagnosis Date  . Carpal tunnel syndrome, bilateral   . Carpal tunnel syndrome, right    nerve impingement right arm/wears brace  . Diabetes mellitus    no meds  . GERD (gastroesophageal reflux disease)   . Gout   . Hepatic steatosis   . Hyperlipidemia   . Hypertension   . Obstructive sleep apnea     Past Surgical History:  Procedure Laterality Date  . CARPAL TUNNEL RELEASE  09/10/2015   RIGHT WRIST / WITH NERVE IMPINGEMENT SURGERY  . CERVICAL FUSION     Dr Sherwood Gambler  . COLONOSCOPY  2007   negative; [facility]  . ESOPHAGEAL DILATION  2007  . 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 LUMBAR FUSION      Dr Sherwood Gambler    There were no vitals filed for this visit.  Subjective Assessment - 02/24/17 1640    Subjective  pain stays a 2/10.     Patient is accompained by:  Family member    Currently in Pain?  Yes    Pain Score  2     Pain Location  Back    Pain Orientation  Left    Pain Descriptors / Indicators  Numbness;Aching;Burning    Pain Radiating Towards  to knee LT    Pain Frequency  Constant    Aggravating Factors   standing too long    Pain Relieving Factors  shifting to the side,  rest sitting    Multiple Pain Sites  No                      [facility]  Adult PT Treatment/Exercise - 02/24/17 0001      Lumbar Exercises: Stretches   Passive Hamstring Stretch  2 reps;30 seconds ROM gradually improved    Hip Flexor Stretch  Left;3 reps;30 seconds    Quad Stretch  3 reps;30 seconds;Left    Other Lumbar Stretch Exercise  child's pose       Lumbar Exercises: Aerobic   Nustep  L5 x 6 min  UE/LE      Lumbar Exercises: Supine   Pelvic Tilt  5 reps;5 seconds    Single Leg Bridge  10 reps each      Lumbar Exercises: Prone   Other Prone Lumbar Exercises  multifitus press and press with knee flexion.  5 x each attempted with hip extension pain increased in left piriformis.        Lumbar Exercises: Quadruped   Plank  5 X from elbow and knees  each side,  5 X with elbows/ knees and abd contraction, and in quadriped walking hands out hold 5 seconds and  walk back 5 X.      Manual Therapy   Soft tissue mobilization  LT piriformis after cramp with exercise,.  Able to softwn tissue.             PT Education - 02/24/17 1721    Education provided  Yes    Education Details  hep    Person(s) Educated  Spouse    Methods  Handout;Verbal cues;Explanation    Comprehension  Verbalized understanding;Returned demonstration          PT Long Term Goals - 02/24/17 1642      PT LONG TERM GOAL #1   Title  independent with HEP    Baseline  adherent so far,  added new today    Period  Weeks    Status  On-going      PT LONG TERM GOAL #2   Title  perform lumbar ROM without increase in pain    Baseline  mild anterior hip pain with single knee to chest left ,  Other ROM ex pain free    Period  Weeks    Status  Partially Met      PT LONG TERM GOAL #3   Title  report ability to stand > 15 min without LLE symptoms or pain for improved function    Baseline  2 minutes( different fron last report)    Period  Weeks    Status  On-going      PT LONG TERM GOAL #4   Title  report pain < 3/10 with walking > 2 miles for improved function and mobility     Baseline  2 miles of walking  usually with 2-3/10 pain    Period  Weeks    Status  Achieved            Plan - 02/24/17 1730    Clinical Impression Statement  Patient continues to work hard in PT today.  He was able to get hamstring length WNL.  LTG#4 met.  pain 2/10 at end of session.      PT Next Visit Plan   , review HEP, manual/modalities PRN, initiate core strengthening.  See how single leg bridging  is going.  stretch/check hip flex/ quad tightness FOTO?   PT Home Exercise Plan  single knee to chest, piriformis stretching, hamstring stretch and SLR,  tilt,  single leg bridge.    Consulted and Agree with Plan of Care  Patient       Patient will benefit from skilled therapeutic intervention in order to improve the following deficits and impairments:     Visit Diagnosis: Chronic left-sided low back pain with left-sided sciatica  Myalgia  Abnormal posture     Problem List Patient Active Problem List   Diagnosis Date Noted  . Abdominal bloating 05/15/2015  . UTI (urinary tract infection) 04/20/2014  . Prostatitis 04/20/2014  . Hyperglycemia 04/20/2014  . Fatigue 03/07/2014  . Family history of early CAD 03/07/2014  . Maxillary sinusitis, acute 12/06/2013  . Tinea versicolor 12/06/2013  . Physical exam 11/06/2013  . 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 Depression 03/29/2013  . Other malaise and fatigue 03/29/2013  . Decreased libido 03/29/2013  . Hematuria 02/17/2013  . Right elbow pain 12/15/2012  . Pustular folliculitis 16/38/4665  . Left ankle pain 03/15/2012  . Hypertension associated with diabetes ([place]) 12/01/2011  . Renal insufficiency 12/01/2011  . SPINAL STENOSIS, LUMBAR 08/02/2008  . Vitamin D deficiency 11/30/2007  . GOUT 11/30/2007  .  BPH (benign prostatic hyperplasia) 11/30/2007  . Diabetes mellitus with renal manifestation ([place]) 06/11/2006  . ANEMIA NEC 06/11/2006  . Chronic back pain greater than 3 months duration  06/11/2006  . Hyperlipidemia associated with type 2 diabetes mellitus ([place]) 05/26/2006  . OBSTRUCTIVE SLEEP APNEA 05/26/2006    HARRIS,KAREN  PTA 02/24/2017, 5:35 PM  [facility] [address] [place], [place], [address] Phone: [phone]   Fax:  [phone]  Name: [name] MRN: [id number] Date of Birth: 09-23-1955

## 2017-02-24 NOTE — Patient Instructions (Addendum)
Pelvic Tilt    Flatten back by tightening stomach muscles and buttocks. Repeat ___5-10_ times per set. Do __1__ sets per session. Do _1___ sessions per day.  Life long exercise  http://orth.exer.us/134   Copyright  VHI. All rights reserved.  Bridging (Single Leg)    Lie on back with feet shoulder width apart and right leg straight. Lift hips toward the ceiling while keeping leg straight. Hold 1____ seconds. Repeat _5-10__ times. Do 1____ sessions per day.    3 to 4 days a week  http://gt2.exer.us/358   Copyright  VHI. All rights reserved.

## 2017-02-24 NOTE — Telephone Encounter (Signed)
Ok to rf meds

## 2017-02-25 MED ORDER — AMLODIPINE BESYLATE 10 MG PO TABS: ORAL_TABLET | ORAL | 1 refills | Status: DC

## 2017-02-26 ENCOUNTER — Ambulatory Visit: Payer: PPO | Admitting: Physical Therapy

## 2017-02-26 ENCOUNTER — Encounter: Payer: Self-pay | Admitting: Physical Therapy

## 2017-02-26 DIAGNOSIS — R293 Abnormal posture: Secondary | ICD-10-CM

## 2017-02-26 DIAGNOSIS — M791 Myalgia, unspecified site: Secondary | ICD-10-CM

## 2017-02-26 DIAGNOSIS — M5442 Lumbago with sciatica, left side: Principal | ICD-10-CM

## 2017-02-26 DIAGNOSIS — G8929 Other chronic pain: Secondary | ICD-10-CM

## 2017-02-26 NOTE — Therapy (Signed)
[place], [place], [address] Phone: [phone]   Fax:  [phone]  Physical Therapy Treatment  Patient Details  Name: [name] MRN: [id number] Date of Birth: Feb 28, 1955 Referring Provider: [name]   Encounter Date: 02/26/2017   PT End of Session - 02/26/17 0919    Visit Number  10    Number of Visits  12    Date for PT Re-Evaluation  03/04/17    Authorization Type  [employer]    PT Start Time  0849    PT Stop Time  0930    PT Time Calculation (min)  41 min    Activity Tolerance  Patient tolerated treatment well    Behavior During Therapy  [facility] for tasks assessed/performed       Past Medical History:  Diagnosis Date  . Carpal tunnel syndrome, bilateral   . Carpal tunnel syndrome, right    nerve impingement right arm/wears brace  . Diabetes mellitus    no meds  . GERD (gastroesophageal reflux disease)   . Gout   . Hepatic steatosis   . Hyperlipidemia   . Hypertension   . Obstructive sleep apnea     Past Surgical History:  Procedure Laterality Date  . CARPAL TUNNEL RELEASE  09/10/2015   RIGHT WRIST / WITH NERVE IMPINGEMENT SURGERY  . CERVICAL FUSION     Dr Sherwood Gambler  . COLONOSCOPY  2007   negative; [facility]  . ESOPHAGEAL DILATION  2007  . 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 LUMBAR FUSION      Dr Sherwood Gambler    There were no vitals filed for this visit.  Subjective Assessment - 02/26/17 0854    Subjective  Pain is about a 2/10 today. Yesterday his pain reached a 5/10. He also had some numbness when he was standing and walking. The more he stands the more pain he has.     Pertinent History  hx 2 back surgeries (L5/S1 fusion, and ACDF)    Limitations  Standing;Walking    How long can you stand comfortably?  numbness occurs in 5-10 min    How long can you walk comfortably?  1.5 miles    Diagnostic tests  none recently     Patient Stated Goals  improve pain; relieve numbness    Currently in Pain?  Yes    Pain Score  2     Pain Location  Back    Pain Orientation  Left    Pain Descriptors / Indicators  Aching;Burning;Numbness    Pain Type  Chronic pain    Pain Radiating Towards  toi left knee    Pain Onset  More than a month ago    Pain Frequency  Constant    Aggravating Factors   standing too long     Pain Relieving Factors  shifting sid eto side                       [facility] Adult PT Treatment/Exercise - 02/26/17 0001      Lumbar Exercises: Stretches   Passive Hamstring Stretch  2 reps;30 seconds ROM gradually improved    Quad Stretch  Limitations    Quad Stretch Limitations  reviewed thomas stretch 3x20 sec hold       Lumbar Exercises: Aerobic   Nustep  L5 x 6 min  UE/LE      Lumbar Exercises: Standing  Other Standing Lumbar Exercises  standing low range chop 2x10 bilateral 3 plates     Other Standing Lumbar Exercises  pallof press 2x10 bilateral 3 plates       Manual Therapy   Manual Therapy  Soft tissue mobilization    Soft tissue mobilization  IASTM over L piriformis/ glute med       Trigger Point Dry Needling - 02/26/17 0926    Consent Given?  Yes    Education Handout Provided  Yes    Longissimus Response  Twitch response elicited    Gluteus Minimus Response  Twitch response elicited           PT Education - 02/26/17 0858    Education provided  Yes    Education Details  benefits and risks of TPDN     Person(s) Educated  Patient    Methods  Explanation;Demonstration;Tactile cues;Verbal cues    Comprehension  Verbalized understanding;Returned demonstration;Verbal cues required;Tactile cues required          PT Long Term Goals - 02/24/17 1642      PT LONG TERM GOAL #1   Title  independent with HEP    Baseline  adherent so far,  added new today    Period  Weeks    Status  On-going      PT LONG TERM GOAL #2   Title  perform lumbar ROM without increase in  pain    Baseline  mild anterior hip pain with single knee to chest left ,  Other ROM ex pain free    Period  Weeks    Status  Partially Met      PT LONG TERM GOAL #3   Title  report ability to stand > 15 min without LLE symptoms or pain for improved function    Baseline  2 minutes( different fron last report)    Period  Weeks    Status  On-going      PT LONG TERM GOAL #4   Title  report pain < 3/10 with walking > 2 miles for improved function and mobility    Baseline  2 miles of walking  usually with 2-3/10 pain    Period  Weeks    Status  Achieved            Plan - 02/26/17 0926    Clinical Impression Statement  Patient tolerated treatment well. he was able to perfrom gym exercises without pain. He had fair twitch resposes to needling in his left paraspinals and his left gluteals. He still has a spasm in the left gluteal. He was advised to continue using the tennis ball.     PT Frequency  2x / week    PT Duration  6 weeks    PT Treatment/Interventions  ADLs/Self Care Home Management;Cryotherapy;Electrical Stimulation;Moist Heat;Therapeutic exercise;Traction;Therapeutic activities;Functional mobility training;Stair training;Gait training;Ultrasound;Balance training;Neuromuscular re-education;Patient/family education;Manual techniques;Taping;Dry needling    PT Next Visit Plan   , review HEP, manual/modalities PRN, initiate core strengthening.  See how single leg bridging  is going.  stretch/check hip flex/ quad tightness    PT Home Exercise Plan  single knee to chest, piriformis stretching, hamstring stretch and SLR,  tilt,  single leg bridge.    Consulted and Agree with Plan of Care  Patient       Patient will benefit from skilled therapeutic intervention in order to improve the following deficits and impairments:  Pain, Increased fascial restricitons, Increased muscle spasms, Postural dysfunction, Decreased mobility, Difficulty walking, Decreased strength    Visit  Diagnosis: Chronic left-sided low back pain with left-sided sciatica  Myalgia  Abnormal posture     Problem List Patient Active Problem List   Diagnosis Date Noted  . Abdominal bloating 05/15/2015  . UTI (urinary tract infection) 04/20/2014  . Prostatitis 04/20/2014  . Hyperglycemia 04/20/2014  . Fatigue 03/07/2014  . Family history of early CAD 03/07/2014  . Maxillary sinusitis, acute 12/06/2013  . Tinea versicolor 12/06/2013  . Physical exam 11/06/2013  . 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 Depression 03/29/2013  . Other malaise and fatigue 03/29/2013  . Decreased libido 03/29/2013  . Hematuria 02/17/2013  . Right elbow pain 12/15/2012  . Pustular folliculitis 59/74/1638  . Left ankle pain 03/15/2012  . Hypertension associated with diabetes ([place]) 12/01/2011  . Renal insufficiency 12/01/2011  . SPINAL STENOSIS, LUMBAR 08/02/2008  . Vitamin D deficiency 11/30/2007  . GOUT 11/30/2007  . BPH (benign prostatic hyperplasia) 11/30/2007  . Diabetes mellitus with renal manifestation ([place]) 06/11/2006  . ANEMIA NEC 06/11/2006  . Chronic back pain greater than 3 months duration 06/11/2006  . Hyperlipidemia associated with type 2 diabetes mellitus ([place]) 05/26/2006  . OBSTRUCTIVE SLEEP APNEA 05/26/2006    Carney Living PT DPT  02/26/2017, 9:55 AM  [facility] [address] [place], [place], [address] Phone: [phone]   Fax:  [phone]  Name: [name] MRN: [id number] Date of Birth: 1955/12/18

## 2017-03-02 ENCOUNTER — Encounter: Payer: PPO | Admitting: Physical Therapy

## 2017-03-03 ENCOUNTER — Encounter: Payer: PPO | Admitting: Physical Therapy

## 2017-03-04 ENCOUNTER — Encounter: Payer: Self-pay | Admitting: Physical Therapy

## 2017-03-04 ENCOUNTER — Ambulatory Visit: Payer: PPO | Admitting: Physical Therapy

## 2017-03-04 DIAGNOSIS — M791 Myalgia, unspecified site: Secondary | ICD-10-CM

## 2017-03-04 DIAGNOSIS — R293 Abnormal posture: Secondary | ICD-10-CM

## 2017-03-04 DIAGNOSIS — G8929 Other chronic pain: Secondary | ICD-10-CM

## 2017-03-04 DIAGNOSIS — M5442 Lumbago with sciatica, left side: Principal | ICD-10-CM

## 2017-03-04 NOTE — Therapy (Signed)
[place], [place], [address] Phone: [phone]   Fax:  [phone]  Physical Therapy Treatment/Discharge  Patient Details  Name: [name] MRN: [id number] Date of Birth: Nov 08, 1955 Referring Provider: [name]   Encounter Date: 03/04/2017  PT End of Session - 03/04/17 1606    Visit Number  11    Date for PT Re-Evaluation  03/04/17    Authorization Type  [employer]    PT Start Time  1540    PT Stop Time  1600 d/c visit    PT Time Calculation (min)  20 min    Activity Tolerance  Patient tolerated treatment well    Behavior During Therapy  [facility] for tasks assessed/performed       Past Medical History:  Diagnosis Date  . Carpal tunnel syndrome, bilateral   . Carpal tunnel syndrome, right    nerve impingement right arm/wears brace  . Diabetes mellitus    no meds  . GERD (gastroesophageal reflux disease)   . Gout   . Hepatic steatosis   . Hyperlipidemia   . Hypertension   . Obstructive sleep apnea     Past Surgical History:  Procedure Laterality Date  . CARPAL TUNNEL RELEASE  09/10/2015   RIGHT WRIST / WITH NERVE IMPINGEMENT SURGERY  . CERVICAL FUSION     Dr Sherwood Gambler  . COLONOSCOPY  2007   negative; [facility]  . ESOPHAGEAL DILATION  2007  . 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 LUMBAR FUSION      Dr Sherwood Gambler    There were no vitals filed for this visit.  Subjective Assessment - 03/04/17 1542    Subjective  able to manage pain better; wants to wrap up today.      Pertinent History  hx 2 back surgeries (L5/S1 fusion, and ACDF)    How long can you stand comfortably?  20 min    How long can you walk comfortably?  about 2 miles, no pain    Patient Stated Goals  improve pain; relieve numbness    Currently in Pain?  No/denies         [facility] PT Assessment - 03/04/17 1603      AROM   Overall AROM Comments  lumbar ROM WNL without  pain                  [facility] Adult PT Treatment/Exercise - 03/04/17 1545      Self-Care   ADL's  discussed benefits of water exercise; pt and wife verbalized understanding      Lumbar Exercises: Aerobic   Nustep  L6 x 6 min  LE only      Lumbar Exercises: Supine   Pelvic Tilt  10 reps;5 seconds    Single Leg Bridge  10 reps il             PT Education - 03/04/17 1606    Education provided  Yes    Education Details  water exercise    Person(s) Educated  Patient    Methods  Explanation    Comprehension  Verbalized understanding          PT Long Term Goals - 03/04/17 1607      PT LONG TERM GOAL #1   Title  independent with HEP    Status  Achieved      PT LONG TERM GOAL #2   Title  perform lumbar ROM  without increase in pain    Status  Achieved      PT LONG TERM GOAL #3   Title  report ability to stand > 15 min without LLE symptoms or pain for improved function    Status  Achieved      PT LONG TERM GOAL #4   Title  report pain < 3/10 with walking > 2 miles for improved function and mobility    Status  Achieved            Plan - 03/04/17 1607    Clinical Impression Statement  Pt has met all goals and is ready for d/c.  Still has numbness which has been present since surgery, and likley due to nerve damage.  Pt agreeable to d/c.     PT Treatment/Interventions  ADLs/Self Care Home Management;Cryotherapy;Electrical Stimulation;Moist Heat;Therapeutic exercise;Traction;Therapeutic activities;Functional mobility training;Stair training;Gait training;Ultrasound;Balance training;Neuromuscular re-education;Patient/family education;Manual techniques;Taping;Dry needling    PT Next Visit Plan  d/c PT today    Consulted and Agree with Plan of Care  Patient       Patient will benefit from skilled therapeutic intervention in order to improve the following deficits and impairments:  Pain, Increased fascial restricitons, Increased muscle spasms, Postural  dysfunction, Decreased mobility, Difficulty walking, Decreased strength  Visit Diagnosis: Chronic left-sided low back pain with left-sided sciatica  Myalgia  Abnormal posture     Problem List Patient Active Problem List   Diagnosis Date Noted  . Abdominal bloating 05/15/2015  . UTI (urinary tract infection) 04/20/2014  . Prostatitis 04/20/2014  . Hyperglycemia 04/20/2014  . Fatigue 03/07/2014  . Family history of early CAD 03/07/2014  . Maxillary sinusitis, acute 12/06/2013  . Tinea versicolor 12/06/2013  . Physical exam 11/06/2013  . 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 Depression 03/29/2013  . Other malaise and fatigue 03/29/2013  . Decreased libido 03/29/2013  . Hematuria 02/17/2013  . Right elbow pain 12/15/2012  . Pustular folliculitis 94/80/1655  . Left ankle pain 03/15/2012  . Hypertension associated with diabetes ([place]) 12/01/2011  . Renal insufficiency 12/01/2011  . SPINAL STENOSIS, LUMBAR 08/02/2008  . Vitamin D deficiency 11/30/2007  . GOUT 11/30/2007  . BPH (benign prostatic hyperplasia) 11/30/2007  . Diabetes mellitus with renal manifestation ([place]) 06/11/2006  . ANEMIA NEC 06/11/2006  . Chronic back pain greater than 3 months duration 06/11/2006  . Hyperlipidemia associated with type 2 diabetes mellitus ([place]) 05/26/2006  . OBSTRUCTIVE SLEEP APNEA 05/26/2006      Laureen Abrahams, PT, DPT 03/04/17 4:10 PM    [facility] [address] [place], [place], [address] Phone: [phone]   Fax:  [phone]  Name: [name] MRN: [id number] Date of Birth: 04-24-1955      PHYSICAL THERAPY DISCHARGE SUMMARY  Visits from Start of Care: 11  Current functional level related to goals / functional outcomes: See above   Remaining deficits: See above   Education / Equipment: HEP, posture/body mechanics  Plan: Patient agrees to discharge.  Patient goals were met.  Patient is being discharged due to meeting the stated rehab goals.  ?????     Laureen Abrahams, PT, DPT 03/04/17 4:11 PM   Outpatient Rehab [address], [place] [address]  [phone] (office) [phone] (fax)

## 2017-03-17 ENCOUNTER — Ambulatory Visit: Payer: PPO | Admitting: Pulmonary Disease

## 2017-03-17 ENCOUNTER — Encounter: Payer: Self-pay | Admitting: Pulmonary Disease

## 2017-03-17 VITALS — BP 108/72 | HR 72 | Ht 71.0 in | Wt 233.0 lb

## 2017-03-17 DIAGNOSIS — Z9989 Dependence on other enabling machines and devices: Secondary | ICD-10-CM

## 2017-03-17 DIAGNOSIS — G4733 Obstructive sleep apnea (adult) (pediatric): Secondary | ICD-10-CM

## 2017-03-17 NOTE — Patient Instructions (Signed)
Follow up in 1 year.

## 2017-03-17 NOTE — Progress Notes (Signed)
Pulmonary, Critical Care, and Sleep Medicine  Chief Complaint  Patient presents with  . Follow-up    Pt doing well with new cpap machine, doing well overall.    Vital signs: BP 108/72 (BP Location: Left Arm, Cuff Size: Normal)   Pulse 72   Ht '5\' 11"'  (1.803 m)   Wt 233 lb (105.7 kg)   SpO2 98%   BMI 32.50 kg/m   History of Present Illness: [name] is a 62 y.o. male with obstructive sleep apnea.  He is doing much better since getting new machine.  His mask is comfortable.  Sleeping through the night.  Feels rested in the day.  Physical Exam:  General - pleasant Eyes - pupils reactive ENT - no sinus tenderness, no oral exudate, no LAN Cardiac - regular, no murmur Chest - no wheeze, rales Abd - soft, non tender Ext - no edema Skin - no rashes Neuro - normal strength Psych - normal mood  Assessment/Plan:  Obstructive sleep apnea. - he is compliant with CPAP and reports benefit from therapy - continue auto CPAP   Patient Instructions  Follow up in 1 year    Chesley Mires, MD [place] 03/17/2017, 10:03 AM Pager:  [phone]  Flow Sheet  Sleep tests: PSG 03/08/03 >> AHI 33 Auto CPAP 02/15/17 to 03/16/17 >> used on 30 of 30 nights with average 8 hrs 56 min.  Average AHI 4.5 with median CPAP 8 and 95 th percentile CPAP 11 cm H2O  Past Medical History: He  has a past medical history of Carpal tunnel syndrome, bilateral, Carpal tunnel syndrome, right, Diabetes mellitus, GERD (gastroesophageal reflux disease), Gout, Hepatic steatosis, Hyperlipidemia, Hypertension, and Obstructive sleep apnea.  Past Surgical History: He  has a past surgical history that includes Finger amputation (2003); Cervical fusion; Lumbar fusion; Esophageal dilation (2007); Colonoscopy (2007); and Carpal tunnel release (09/10/2015).  Family History: His family history includes Aneurysm (age of onset: 55) in his mother; Cancer in his maternal uncle and paternal  uncle; Diabetes in his sister; Heart attack in his father; Heart disease in his father, sister, and sister; Kidney disease in his brother; Sudden death in his mother; Urolithiasis in his father.  Social History: He  reports that he quit smoking about 41 years ago. he has never used smokeless tobacco. He reports that he does not drink alcohol or use drugs.  Medications: Allergies as of 03/17/2017      Reactions   Prednisone Other (See Comments)   Caused blood sugar to increase, pt will not take      Medication List        Accurate as of 03/17/17 10:03 AM. Always use your most recent med list.          allopurinol 300 MG tablet Commonly known as:  ZYLOPRIM TAKE 1/2 TABLET BY MOUTH DAILY   amLODipine 10 MG tablet Commonly known as:  NORVASC TAKE 1/2 TABLET BY MOUTH THREE TIMES PER WEEK   aspirin 81 MG tablet Take 81 mg by mouth daily.   atorvastatin 20 MG tablet Commonly known as:  LIPITOR   benazepril 20 MG tablet Commonly known as:  LOTENSIN TAKE 1 TABLET BY MOUTH DAILY   blood glucose meter kit and supplies Dispense based on patient and insurance preference. Use to check fasting blood glucose in the morning and to check glucose 2 hours after largest meal of the day. (FOR ICD-10 E10.9, E11.9).   FLUoxetine 40 MG capsule Commonly known as:  PROZAC TAKE  1 CAPSULE BY MOUTH DAILY   fluticasone 50 MCG/ACT nasal spray Commonly known as:  FLONASE Place 2 sprays into both nostrils daily.   gabapentin 800 MG tablet Commonly known as:  NEURONTIN Take 1 tablet (800 mg total) by mouth 3 (three) times daily.   glucose blood test strip Use as instructed   LANCETS ULTRA FINE Misc Use to check fasting blood glucose in the morning and to check glucose 2 hours after largest meal of the day   multivitamins with iron Tabs tablet Take 1 tablet by mouth daily.   MYRBETRIQ 25 MG Tb24 tablet Generic drug:  mirabegron ER   pantoprazole 40 MG tablet Commonly known as:   PROTONIX TAKE 1 TABLET BY MOUTH DAILY   polyethylene glycol powder powder Commonly known as:  GLYCOLAX/MIRALAX USE AS DIRECTED   pyridoxine 100 MG tablet Commonly known as:  B-6 Take 100 mg by mouth daily.   traZODone 50 MG tablet Commonly known as:  DESYREL TAKE 1/2 TO 1 TABLET BY MOUTH EVERY NIGHT AT BEDTIME AS NEEDED FOR SLEEP   vitamin C 1000 MG tablet Take 1,000 mg by mouth daily.   Vitamin D (Ergocalciferol) 50000 units Caps capsule Commonly known as:  DRISDOL Take 1 capsule (50,000 Units total) by mouth every 7 (seven) days.

## 2017-03-22 DIAGNOSIS — G4733 Obstructive sleep apnea (adult) (pediatric): Secondary | ICD-10-CM | POA: Diagnosis not present

## 2017-03-24 ENCOUNTER — Ambulatory Visit (INDEPENDENT_AMBULATORY_CARE_PROVIDER_SITE_OTHER): Payer: PPO | Admitting: Family Medicine

## 2017-03-24 ENCOUNTER — Encounter: Payer: Self-pay | Admitting: Family Medicine

## 2017-03-24 VITALS — BP 127/78 | HR 64 | Ht 71.0 in | Wt 235.0 lb

## 2017-03-24 DIAGNOSIS — Z91199 Patient's noncompliance with other medical treatment and regimen due to unspecified reason: Secondary | ICD-10-CM | POA: Insufficient documentation

## 2017-03-24 DIAGNOSIS — Z9119 Patient's noncompliance with other medical treatment and regimen: Secondary | ICD-10-CM | POA: Insufficient documentation

## 2017-03-24 DIAGNOSIS — E1129 Type 2 diabetes mellitus with other diabetic kidney complication: Secondary | ICD-10-CM | POA: Diagnosis not present

## 2017-03-24 DIAGNOSIS — R1013 Epigastric pain: Secondary | ICD-10-CM

## 2017-03-24 DIAGNOSIS — R809 Proteinuria, unspecified: Secondary | ICD-10-CM | POA: Diagnosis not present

## 2017-03-24 DIAGNOSIS — R143 Flatulence: Secondary | ICD-10-CM | POA: Diagnosis not present

## 2017-03-24 LAB — GLUCOSE, POCT (MANUAL RESULT ENTRY): POC Glucose: 102 mg/dl — AB (ref 70–99)

## 2017-03-24 NOTE — Progress Notes (Signed)
Pt here for an acute care OV today   Impression and Recommendations:    1. Epigastric pain   2. Intestinal gas excretion   3. Type 2 diabetes mellitus with microalbuminuria, unspecified whether long term insulin use ([place])   4. Noncompliance     1. Epigastric pain 2. Intestinal gas excretion- -If you develop fever, chills, nausea, or acute abdominal pain, go to the emergency room for imaging.  -Avoid foods that produce a lot of intestinal gas. Handouts and information provided.  -Use OTC gas-X or other medications for gas relief.  -Add labs today.   3. Type 2 DM with microalbuminuria- -Pt strongly encouraged to get a glucometer at home to check blood sugars. Pt has a h/o uncontrolled blood sugar with last A1c 8.8. -pt encouraged to get control of his blood sugars and continue diet and exercise.  4. Noncompliance- -pt has a h/o noncompliance with treatment plans.      No orders of the defined types were placed in this encounter.   Orders Placed This Encounter  Procedures  . Comprehensive metabolic panel  . CBC with Differential/Platelet  . Amylase  . Lipase  . Gamma GT  . Bilirubin, fractionated(tot/dir/indir)  . POCT Glucose (CBG)     Education and routine counseling performed. Handouts provided  Gross side effects, risk and benefits, and alternatives of medications and treatment plan in general discussed with patient.  Patient is aware that all medications have potential side effects and we are unable to predict every side effect or drug-drug interaction that may occur.   Patient will call with any questions prior to using medication if they have concerns.  Expresses verbal understanding and consents to current therapy and treatment regimen.  No barriers to understanding were identified.  Red flag symptoms and signs discussed in detail.  Patient expressed understanding regarding what to do in case of emergency\urgent symptoms   Please see AVS handed out to  patient at the end of our visit for further patient instructions/ counseling done pertaining to today's office visit.   Return for F-up of current med issues as previously d/c pt.     Note: This document was prepared occasionally using Dragon voice recognition software and may include unintentional dictation errors in addition to a scribe.  This document serves as a record of services personally performed by Mellody Dance, DO. It was created on her behalf by Mayer Masker, a trained medical scribe. The creation of this record is based on the scribe's personal observations and the provider's statements to them.   I have reviewed the above medical documentation for accuracy and completeness and I concur.  Mellody Dance 03/30/17 8:24 AM   --------------------------------------------------------------------------------------------------------------------------------------------------------------------------------------------------------------------------------------------    Subjective:    CC:  Chief Complaint  Patient presents with  . Abdominal Pain    upper abd pains - center and to the right x 2-3 days     HPI: [name] is a 62 y.o. male who presents to [facility] today for issues as discussed below.  Abdomen This began 2-3 days ago. He had sharp, stabbing pain in his central abdomen that radiates to his R abdomen. He is burping a lot. He has not had this before. It is not worse or better with food. He states his pain is improved with a bowel movement. He states he cannot do daily activities without burping because of the pain. It improves throughout the day but never goes away completely. This  does not feel similarly to his reflux. He has tried Copywriter, advertising, tums, and prilosec with mild to no relief. He denies nausea, constipation, vomiting, diarrhea, fever, chills, and abdominal pain. He enjoys eating beans. He did not eat anything out of the  ordinary.   Pt has a h/o uncontrolled blood sugar with last A1c 8.8.  He has not been checking his blood sugars recently because "the place I go did not sell them". Last A1c on 01-08-17 was 8.8. He has been eating healthy and exercising daily.    No problems updated.   Wt Readings from Last 3 Encounters:  03/24/17 235 lb (106.6 kg)  03/17/17 233 lb (105.7 kg)  02/11/17 240 lb (108.9 kg)   BP Readings from Last 3 Encounters:  03/24/17 127/78  03/17/17 108/72  02/11/17 116/72   BMI Readings from Last 3 Encounters:  03/24/17 32.78 kg/m  03/17/17 32.50 kg/m  02/11/17 32.55 kg/m     Patient Care Team    Relationship Specialty Notifications Start End  Mellody Dance, DO PCP - General Family Medicine  10/28/16   Rutherford Guys, MD Consulting Physician Ophthalmology  11/23/14   Jovita Gamma, MD Consulting Physician Neurosurgery  11/23/14   Kathie Rhodes, MD Consulting Physician Urology  12/07/14   Milus Banister, MD Attending Physician Gastroenterology  12/07/14   Chesley Mires, MD Consulting Physician Pulmonary Disease  12/07/16   Garald Balding, MD Consulting Physician Orthopedic Surgery  12/07/16    Comment: back and hip  Dene Gentry, MD Consulting Physician Sports Medicine  12/07/16    Comment: back and hip pain     Patient Active Problem List   Diagnosis Date Noted  . Hypertension associated with diabetes ([place]) 12/01/2011    Priority: High  . Diabetes mellitus with renal manifestation ([place]) 06/11/2006    Priority: High  . Hyperlipidemia associated with type 2 diabetes mellitus ([place]) 05/26/2006    Priority: High  . OBSTRUCTIVE SLEEP APNEA 05/26/2006    Priority: High  . Prostatitis 04/20/2014    Priority: Medium  . Renal insufficiency 12/01/2011    Priority: Medium  . 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 Vitamin D deficiency 11/30/2007    Priority: Low  . Noncompliance 03/24/2017  . Abdominal bloating  05/15/2015  . UTI (urinary tract infection) 04/20/2014  . Hyperglycemia 04/20/2014  . Fatigue 03/07/2014  . Family history of early CAD 03/07/2014  . Maxillary sinusitis, acute 12/06/2013  . Tinea versicolor 12/06/2013  . Physical exam 11/06/2013  . Depression 03/29/2013  . Other malaise and fatigue 03/29/2013  . Decreased libido 03/29/2013  . Hematuria 02/17/2013  . Right elbow pain 12/15/2012  . Pustular folliculitis 50/09/3816  . Left ankle pain 03/15/2012  . SPINAL STENOSIS, LUMBAR 08/02/2008  . GOUT 11/30/2007  . BPH (benign prostatic hyperplasia) 11/30/2007  . ANEMIA NEC 06/11/2006  . Chronic back pain greater than 3 months duration 06/11/2006    Past Medical history, Surgical history, Family history, Social history, Allergies and Medications have been entered into the medical record, reviewed and changed as needed.    Current Meds  Medication Sig  . allopurinol (ZYLOPRIM) 300 MG tablet TAKE 1/2 TABLET BY MOUTH DAILY  . amLODipine (NORVASC) 10 MG tablet TAKE 1/2 TABLET BY MOUTH THREE TIMES PER WEEK  . Ascorbic Acid (VITAMIN C) 1000 MG tablet Take 1,000 mg by mouth daily.  Marland Kitchen aspirin 81 MG tablet Take 81 mg by mouth daily.    Marland Kitchen  atorvastatin (LIPITOR) 20 MG tablet   . benazepril (LOTENSIN) 20 MG tablet TAKE 1 TABLET BY MOUTH DAILY  . blood glucose meter kit and supplies Dispense based on patient and insurance preference. Use to check fasting blood glucose in the morning and to check glucose 2 hours after largest meal of the day. (FOR ICD-10 E10.9, E11.9).  Marland Kitchen FLUoxetine (PROZAC) 40 MG capsule TAKE 1 CAPSULE BY MOUTH DAILY  . fluticasone (FLONASE) 50 MCG/ACT nasal spray Place 2 sprays into both nostrils daily.  Marland Kitchen gabapentin (NEURONTIN) 800 MG tablet Take 1 tablet (800 mg total) by mouth 3 (three) times daily.  Marland Kitchen glucose blood test strip Use as instructed  . LANCETS ULTRA FINE MISC Use to check fasting blood glucose in the morning and to check glucose 2 hours after largest meal  of the day  . Multiple Vitamins-Iron (MULTIVITAMINS WITH IRON) TABS Take 1 tablet by mouth daily.  Marland Kitchen MYRBETRIQ 25 MG TB24 tablet   . pantoprazole (PROTONIX) 40 MG tablet TAKE 1 TABLET BY MOUTH DAILY  . polyethylene glycol powder (GLYCOLAX/MIRALAX) powder USE AS DIRECTED  . pyridoxine (B-6) 100 MG tablet Take 100 mg by mouth daily.  . traZODone (DESYREL) 50 MG tablet TAKE 1/2 TO 1 TABLET BY MOUTH EVERY NIGHT AT BEDTIME AS NEEDED FOR SLEEP  . Vitamin D, Ergocalciferol, (DRISDOL) 50000 units CAPS capsule Take 1 capsule (50,000 Units total) by mouth every 7 (seven) days.    Allergies:  Allergies  Allergen Reactions  . Prednisone Other (See Comments)    Caused blood sugar to increase, pt will not take     Review of Systems: General:   Denies fever, chills, unexplained weight loss.  Optho/Auditory:   Denies visual changes, blurred vision/LOV Respiratory:   Denies wheeze, DOE more than baseline levels.  Cardiovascular:   Denies chest pain, palpitations, new onset peripheral edema  Gastrointestinal:   Denies nausea, vomiting, diarrhea, abd pain.  Genitourinary: Denies dysuria, freq/ urgency, flank pain or discharge from genitals.  Endocrine:     Denies hot or cold intolerance, polyuria, polydipsia. Musculoskeletal:   Denies unexplained myalgias, joint swelling, unexplained arthralgias, gait problems.  Skin:  Denies new onset rash, suspicious lesions Neurological:     Denies dizziness, unexplained weakness, numbness  Psychiatric/Behavioral:   Denies mood changes, suicidal or homicidal ideations, hallucinations    Objective:   Blood pressure 127/78, pulse 64, height '5\' 11"'  (1.803 m), weight 235 lb (106.6 kg), SpO2 100 %. Body mass index is 32.78 kg/m. General:  Well Developed, well nourished, appropriate for stated age.  Neuro:  Alert and oriented,  extra-ocular muscles intact  HEENT:  Normocephalic, atraumatic, neck supple Skin:  no gross rash, warm, pink. Cardiac:  RRR, S1  S2 Respiratory:  ECTA B/L and A/P, Not using accessory muscles, speaking in full sentences- unlabored. Vascular:  Ext warm, no cyanosis apprec.; cap RF less 2 sec. Psych:  No HI/SI, judgement and insight good, Euthymic mood. Full Affect. Abdomen: -Good bowel sounds x4. No OM. No TTP. No GRR.

## 2017-03-24 NOTE — Patient Instructions (Addendum)
Your exam today is reassuring but even though it is we will obtain blood work   -If you develop any red flag symptoms like we discussed in the office fever chills, nausea vomiting, abdominal pain that is worsening, etc. please go to the nearest ER so you can have stat imaging  -If you continue with symptoms despite our treatment plan, please let us know as you will need to have additional imaging possibly and also may need to be seen by gastroenterology and further evaluation per them.  It is very imperative that you try to control your diabetes and check it regularly.  For the past couple of months you have not checked at all and I recommend that you obtain a glucometer as soon as possible and check your fasting as well as 2 hours postprandial.  Also this could be diabetic gastroparesis and symptoms due to poorly controlled diabetes.  Additionally please try to avoid foods that produce gas and see the handout I gave you on this.  Also to help control symptoms please try to eat these foods below:  - Foods less likely to cause gas include: Meat, poultry, fish Eggs Vegetables such as lettuce, tomatoes, zucchini, okra, Fruits such as cantaloupe, grapes, berries, cherries, avocado, olives Carbohydrates such as gluten-free bread, rice bread, rice  -May take Gas-X, also probiotics and lactase digestive enzymes over-the-counter can be added to your diet to help with digestion of carbohydrates.    Gastroparesis Gastroparesis, also called delayed gastric emptying, is a condition in which food takes longer than normal to empty from the stomach. The condition is usually long-lasting (chronic). What are the causes? This condition may be caused by:  An endocrine disorder, such as hypothyroidism or diabetes. Diabetes is the most common cause of this condition.  A nervous system disease, such as Parkinson disease or multiple sclerosis.  Cancer, infection, or surgery of the stomach or vagus  nerve.  A connective tissue disorder, such as scleroderma.  Certain medicines.  In most cases, the cause is not known. What increases the risk? This condition is more likely to develop in:  People with certain disorders, including endocrine disorders, eating disorders, amyloidosis, and scleroderma.  People with certain diseases, including Parkinson disease or multiple sclerosis.  People with cancer or infection of the stomach or vagus nerve.  People who have had surgery on the stomach or vagus nerve.  People who take certain medicines.  Women.  What are the signs or symptoms? Symptoms of this condition include:  An early feeling of fullness when eating.  Nausea.  Weight loss.  Vomiting.  Heartburn.  Abdominal bloating.  Inconsistent blood glucose levels.  Lack of appetite.  Acid from the stomach coming up into the esophagus (gastroesophageal reflux).  Spasms of the stomach.  Symptoms may come and go. How is this diagnosed? This condition is diagnosed with tests, such as:  Tests that check how long it takes food to move through the stomach and intestines. These tests include: ? Upper gastrointestinal (GI) series. In this test, X-rays of the intestines are taken after you drink a liquid. The liquid makes the intestines show up better on the X-rays. ? Gastric emptying scintigraphy. In this test, scans are taken after you eat food that contains a small amount of radioactive material. ? Wireless capsule GI monitoring system. This test involves swallowing a capsule that records information about movement through the stomach.  Gastric manometry. This test measures electrical and muscular activity in the stomach. It is done  with a thin tube that is passed down the throat and into the stomach.  Endoscopy. This test checks for abnormalities in the lining of the stomach. It is done with a long, thin tube that is passed down the throat and into the stomach.  An  ultrasound. This test can help rule out gallbladder disease or pancreatitis as a cause of your symptoms. It uses sound waves to take pictures of the inside of your body.  How is this treated? There is no cure for gastroparesis. This condition may be managed with:  Treatment of the underlying condition causing the gastroparesis.  Lifestyle changes, including exercise and dietary changes. Dietary changes can include: ? Changes in what and when you eat. ? Eating smaller meals more often. ? Eating low-fat foods. ? Eating low-fiber forms of high-fiber foods, such as cooked vegetables instead of raw vegetables. ? Having liquid foods in place of solid foods. Liquid foods are easier to digest.  Medicines. These may be given to control nausea and vomiting and to stimulate stomach muscles.  Getting food through a feeding tube. This may be done in severe cases.  A gastric neurostimulator. This is a device that is inserted into the body with surgery. It helps improve stomach emptying and control nausea and vomiting.  Follow these instructions at home:  Follow your health care provider's instructions about exercise and diet.  Take medicines only as directed by your health care provider. Contact a health care provider if:  Your symptoms do not improve with treatment.  You have new symptoms. Get help right away if:  You have severe abdominal pain that does not improve with treatment.  You have nausea that does not go away.  You cannot keep fluids down. This information is not intended to replace advice given to you by your health care provider. Make sure you discuss any questions you have with your health care provider. Document Released: 12/22/2004 Document Revised: 05/30/2015 Document Reviewed: 12/18/2013 Elsevier Interactive Patient Education  [employer].

## 2017-03-25 ENCOUNTER — Ambulatory Visit: Payer: PPO | Admitting: Family Medicine

## 2017-03-25 DIAGNOSIS — H903 Sensorineural hearing loss, bilateral: Secondary | ICD-10-CM | POA: Diagnosis not present

## 2017-03-25 DIAGNOSIS — Z9622 Myringotomy tube(s) status: Secondary | ICD-10-CM | POA: Diagnosis not present

## 2017-03-25 DIAGNOSIS — H6983 Other specified disorders of Eustachian tube, bilateral: Secondary | ICD-10-CM | POA: Diagnosis not present

## 2017-03-25 LAB — CBC WITH DIFFERENTIAL/PLATELET
Basophils Absolute: 0 10*3/uL (ref 0.0–0.2)
Basos: 0 %
EOS (ABSOLUTE): 0.1 10*3/uL (ref 0.0–0.4)
Eos: 3 %
Hematocrit: 33.9 % — ABNORMAL LOW (ref 37.5–51.0)
Hemoglobin: 11.4 g/dL — ABNORMAL LOW (ref 13.0–17.7)
Immature Grans (Abs): 0 10*3/uL (ref 0.0–0.1)
Immature Granulocytes: 0 %
Lymphocytes Absolute: 1.2 10*3/uL (ref 0.7–3.1)
Lymphs: 26 %
MCH: 27.3 pg (ref 26.6–33.0)
MCHC: 33.6 g/dL (ref 31.5–35.7)
MCV: 81 fL (ref 79–97)
Monocytes Absolute: 0.4 10*3/uL (ref 0.1–0.9)
Monocytes: 8 %
Neutrophils Absolute: 3 10*3/uL (ref 1.4–7.0)
Neutrophils: 63 %
Platelets: 243 10*3/uL (ref 150–379)
RBC: 4.18 x10E6/uL (ref 4.14–5.80)
RDW: 14.9 % (ref 12.3–15.4)
WBC: 4.8 10*3/uL (ref 3.4–10.8)

## 2017-03-25 LAB — COMPREHENSIVE METABOLIC PANEL
ALT: 18 IU/L (ref 0–44)
AST: 25 IU/L (ref 0–40)
Albumin/Globulin Ratio: 1.6 (ref 1.2–2.2)
Albumin: 4.2 g/dL (ref 3.6–4.8)
Alkaline Phosphatase: 97 IU/L (ref 39–117)
BUN/Creatinine Ratio: 19 (ref 10–24)
BUN: 31 mg/dL — ABNORMAL HIGH (ref 8–27)
Bilirubin Total: 0.2 mg/dL (ref 0.0–1.2)
CO2: 25 mmol/L (ref 20–29)
Calcium: 9.4 mg/dL (ref 8.6–10.2)
Chloride: 104 mmol/L (ref 96–106)
Creatinine, Ser: 1.67 mg/dL — ABNORMAL HIGH (ref 0.76–1.27)
GFR calc Af Amer: 50 mL/min/{1.73_m2} — ABNORMAL LOW (ref >59)
GFR calc non Af Amer: 43 mL/min/{1.73_m2} — ABNORMAL LOW (ref >59)
Globulin, Total: 2.7 g/dL (ref 1.5–4.5)
Glucose: 82 mg/dL (ref 65–99)
Potassium: 4.7 mmol/L (ref 3.5–5.2)
Sodium: 140 mmol/L (ref 134–144)
Total Protein: 6.9 g/dL (ref 6.0–8.5)

## 2017-03-25 LAB — BILIRUBIN, FRACTIONATED(TOT/DIR/INDIR)
Bilirubin, Direct: 0.1 mg/dL (ref 0.00–0.40)
Bilirubin, Indirect: 0.1 mg/dL (ref 0.10–0.80)

## 2017-03-25 LAB — LIPASE: Lipase: 44 U/L (ref 13–78)

## 2017-03-25 LAB — GAMMA GT: GGT: 28 IU/L (ref 0–65)

## 2017-03-25 LAB — AMYLASE: Amylase: 110 U/L (ref 31–124)

## 2017-03-31 ENCOUNTER — Telehealth: Payer: Self-pay | Admitting: Family Medicine

## 2017-03-31 NOTE — Telephone Encounter (Signed)
Patient called states was left message to call office--- forwarding note to medical assistant Pt  returned call .  --glh

## 2017-04-01 ENCOUNTER — Encounter: Payer: Self-pay | Admitting: Family Medicine

## 2017-04-01 ENCOUNTER — Ambulatory Visit: Payer: PPO | Admitting: Family Medicine

## 2017-04-01 DIAGNOSIS — G8929 Other chronic pain: Secondary | ICD-10-CM | POA: Diagnosis not present

## 2017-04-01 DIAGNOSIS — M5442 Lumbago with sciatica, left side: Secondary | ICD-10-CM | POA: Diagnosis not present

## 2017-04-01 NOTE — Patient Instructions (Signed)
You're doing great! Continue the gabapentin. Do the home exercises for your back 3 times a week indefinitely. Follow up with me as needed.

## 2017-04-02 ENCOUNTER — Encounter: Payer: Self-pay | Admitting: Family Medicine

## 2017-04-02 ENCOUNTER — Other Ambulatory Visit: Payer: Self-pay

## 2017-04-02 DIAGNOSIS — D6489 Other specified anemias: Secondary | ICD-10-CM

## 2017-04-02 DIAGNOSIS — N289 Disorder of kidney and ureter, unspecified: Secondary | ICD-10-CM

## 2017-04-02 NOTE — Progress Notes (Signed)
PCP: Mellody Dance, DO  Subjective:   HPI: Patient is a 62 y.o. male here for low back pain.  12/6: Patient reports having had two separate back surgeries and noted only about 3 months of benefit beyond these. Current pain is worse the past 3  Months without new injury or trauma. Pain level 4/10 but up to 10/10 and sharp at times. Feels on left side though can radiate down to left knee with numbness. Worse with standing. Takes ibuprofen. Was on lyrica but now on gabapentin. Also had injection before which helped some. No bowel/bladder dysfunction. + night pain and difficulty getting comfortable.  01/11/17: Patient reports he feels about the same as last visit. He hasn't heard from physical therapy. Is taking the higher dose of gabapentin, 300 tid. Pain up to 4/10 currently and his left leg will go numb if up on it a lot. Worse with sitting also. No bowel/bladder dysfunction.  2/7: Patient reports he's doing well. Has done 5 visits of PT and is doing home exercises. Working out at a gym too and has lost about 15 pounds. Still with pain in left side of back that goes to left leg about the knee. Pain level 2/10, more dull. Some associated numbness. Improved with the gabapentin 600 tid. No bowel/bladder dysfunction.  3/28: Patient reports he's doing well. Pain level 1/10. Done physical therapy and doing home exercises. Working out at [employer] as well. Takes ibuprofen occasionally. No icing or heat. Taking gabapentin 800 tid and tolerating well. No skin changes. No bowel/bladder dysfunction.  Past Medical History:  Diagnosis Date  . Carpal tunnel syndrome, bilateral   . Carpal tunnel syndrome, right    nerve impingement right arm/wears brace  . Diabetes mellitus    no meds  . GERD (gastroesophageal reflux disease)   . Gout   . Hepatic steatosis   . Hyperlipidemia   . Hypertension   . Obstructive sleep apnea     Current Outpatient Medications on File Prior to  Visit  Medication Sig Dispense Refill  . allopurinol (ZYLOPRIM) 300 MG tablet TAKE 1/2 TABLET BY MOUTH DAILY 45 tablet 3  . amLODipine (NORVASC) 10 MG tablet TAKE 1/2 TABLET BY MOUTH THREE TIMES PER WEEK 18 tablet 1  . Ascorbic Acid (VITAMIN C) 1000 MG tablet Take 1,000 mg by mouth daily.    Marland Kitchen aspirin 81 MG tablet Take 81 mg by mouth daily.      Marland Kitchen atorvastatin (LIPITOR) 20 MG tablet     . benazepril (LOTENSIN) 20 MG tablet TAKE 1 TABLET BY MOUTH DAILY 90 tablet 1  . blood glucose meter kit and supplies Dispense based on patient and insurance preference. Use to check fasting blood glucose in the morning and to check glucose 2 hours after largest meal of the day. (FOR ICD-10 E10.9, E11.9). 1 each 0  . FLUoxetine (PROZAC) 40 MG capsule TAKE 1 CAPSULE BY MOUTH DAILY 90 capsule 1  . fluticasone (FLONASE) 50 MCG/ACT nasal spray Place 2 sprays into both nostrils daily. 16 g 6  . gabapentin (NEURONTIN) 800 MG tablet Take 1 tablet (800 mg total) by mouth 3 (three) times daily. 90 tablet 2  . glucose blood test strip Use as instructed 100 each 12  . LANCETS ULTRA FINE MISC Use to check fasting blood glucose in the morning and to check glucose 2 hours after largest meal of the day 100 each 12  . Multiple Vitamins-Iron (MULTIVITAMINS WITH IRON) TABS Take 1 tablet by mouth daily. [place]  tablet 0  . MYRBETRIQ 25 MG TB24 tablet     . pantoprazole (PROTONIX) 40 MG tablet TAKE 1 TABLET BY MOUTH DAILY 90 tablet 1  . polyethylene glycol powder (GLYCOLAX/MIRALAX) powder USE AS DIRECTED 850 g 11  . pyridoxine (B-6) 100 MG tablet Take 100 mg by mouth daily.    . traZODone (DESYREL) 50 MG tablet TAKE 1/2 TO 1 TABLET BY MOUTH EVERY NIGHT AT BEDTIME AS NEEDED FOR SLEEP 30 tablet 6  . Vitamin D, Ergocalciferol, (DRISDOL) 50000 units CAPS capsule Take 1 capsule (50,000 Units total) by mouth every 7 (seven) days. 12 capsule 10   No current facility-administered medications on file prior to visit.     Past Surgical History:   Procedure Laterality Date  . CARPAL TUNNEL RELEASE  09/10/2015   RIGHT WRIST / WITH NERVE IMPINGEMENT SURGERY  . CERVICAL FUSION     Dr Sherwood Gambler  . COLONOSCOPY  2007   negative; [facility]  . ESOPHAGEAL DILATION  2007  . 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 LUMBAR FUSION      Dr Sherwood Gambler    Allergies  Allergen Reactions  . Prednisone Other (See Comments)    Caused blood sugar to increase, pt will not take    Social History   Socioeconomic History  . Marital status: Married    Spouse name: Not on file  . Number of children: Not on file  . Years of education: Not on file  . Highest education level: Not on file  Occupational History  . Not on file  Social Needs  . Financial resource strain: Not on file  . Food insecurity:    Worry: Not on file    Inability: Not on file  . Transportation needs:    Medical: Not on file    Non-medical: Not on file  Tobacco Use  . Smoking status: Former Smoker    Last attempt to quit: 01/06/1976    Years since quitting: 41.2  . Smokeless tobacco: Never Used  . Tobacco comment: smoked 35 years ago as of 2013   Substance and Sexual Activity  . Alcohol use: No  . Drug use: No  . Sexual activity: Yes    Birth control/protection: None  Lifestyle  . Physical activity:    Days per week: Not on file    Minutes per session: Not on file  . Stress: Not on file  Relationships  . Social connections:    Talks on phone: Not on file    Gets together: Not on file    Attends religious service: Not on file    Active member of club or organization: Not on file    Attends meetings of clubs or organizations: Not on file    Relationship status: Not on file  . Intimate partner violence:    Fear of current or ex partner: Not on file    Emotionally abused: Not on file    Physically abused: Not on file    Forced sexual activity: Not on file  Other Topics Concern  . Not on file  Social History Narrative  . Not on  file    Family History  Problem Relation Age of Onset  . Heart disease Father        pacer  . Urolithiasis Father   . Heart attack Father   . Aneurysm Mother 34       CNS aneurysm  . Sudden death Mother   .  Diabetes Sister   . Cancer Maternal Uncle        ? primary  . Cancer Paternal Uncle        ? primary  . Kidney disease Brother   . Heart disease Sister   . Heart disease Sister   . Hyperlipidemia Neg Hx   . Hypertension Neg Hx   . Colon cancer Neg Hx     BP 120/74   Pulse 62   Ht 6' (1.829 m)   Wt 228 lb (103.4 kg)   BMI 30.92 kg/m   Review of Systems: See HPI above.     Objective:  Physical Exam:  Gen: NAD, comfortable in exam room.  Back: No gross deformity, scoliosis. No paraspinal TTP .  No midline or bony TTP. FROM without pain. Strength LEs 5/5 all muscle groups.   1+ MSRs in patellar and achilles tendons, equal bilaterally. Negative SLRs. Sensation intact to light touch bilaterally. Negative logroll bilateral hips  Assessment & Plan:  1. Low back pain with radiation into left leg - 2/2 radiculopathy.  Known advanced arthritis s/p 2 surgeries.  Encouraged to continue with home exercises 3x/week indefinitely.  Continue gabapentin 800 tid.  F/u prn.

## 2017-04-02 NOTE — Assessment & Plan Note (Signed)
2/2 radiculopathy.  Known advanced arthritis s/p 2 surgeries.  Encouraged to continue with home exercises 3x/week indefinitely.  Continue gabapentin 800 tid.  F/u prn.

## 2017-04-02 NOTE — Telephone Encounter (Signed)
Patient was called today and message left to call back. MPulliam, CMA/RT(R)

## 2017-04-02 NOTE — Assessment & Plan Note (Signed)
>>  ASSESSMENT AND PLAN FOR CHRONIC LOW BACK PAIN- W LUMBAR RADICULOPATHY WRITTEN ON 04/02/2017  7:41 PM BY HUDNALL, Azucena Fallen, MD  2/2 radiculopathy.  Known advanced arthritis s/p 2 surgeries.  Encouraged to continue with home exercises 3x/week indefinitely.  Continue gabapentin 800 tid.  F/u prn.

## 2017-04-14 ENCOUNTER — Other Ambulatory Visit: Payer: PPO

## 2017-04-14 DIAGNOSIS — N289 Disorder of kidney and ureter, unspecified: Secondary | ICD-10-CM

## 2017-04-14 DIAGNOSIS — D6489 Other specified anemias: Secondary | ICD-10-CM | POA: Diagnosis not present

## 2017-04-15 LAB — BASIC METABOLIC PANEL
BUN/Creatinine Ratio: 15 (ref 10–24)
BUN: 31 mg/dL — ABNORMAL HIGH (ref 8–27)
CO2: 24 mmol/L (ref 20–29)
Calcium: 9.4 mg/dL (ref 8.6–10.2)
Chloride: 103 mmol/L (ref 96–106)
Creatinine, Ser: 2.09 mg/dL — ABNORMAL HIGH (ref 0.76–1.27)
GFR calc Af Amer: 38 mL/min/{1.73_m2} — ABNORMAL LOW (ref >59)
GFR calc non Af Amer: 33 mL/min/{1.73_m2} — ABNORMAL LOW (ref >59)
Glucose: 115 mg/dL — ABNORMAL HIGH (ref 65–99)
Potassium: 5.1 mmol/L (ref 3.5–5.2)
Sodium: 139 mmol/L (ref 134–144)

## 2017-04-15 LAB — CBC WITH DIFFERENTIAL/PLATELET
Basophils Absolute: 0 10*3/uL (ref 0.0–0.2)
Basos: 1 %
EOS (ABSOLUTE): 0.1 10*3/uL (ref 0.0–0.4)
Eos: 3 %
Hematocrit: 36.4 % — ABNORMAL LOW (ref 37.5–51.0)
Hemoglobin: 11.7 g/dL — ABNORMAL LOW (ref 13.0–17.7)
Immature Grans (Abs): 0 10*3/uL (ref 0.0–0.1)
Immature Granulocytes: 0 %
Lymphocytes Absolute: 1.1 10*3/uL (ref 0.7–3.1)
Lymphs: 25 %
MCH: 27 pg (ref 26.6–33.0)
MCHC: 32.1 g/dL (ref 31.5–35.7)
MCV: 84 fL (ref 79–97)
Monocytes Absolute: 0.4 10*3/uL (ref 0.1–0.9)
Monocytes: 8 %
Neutrophils Absolute: 2.8 10*3/uL (ref 1.4–7.0)
Neutrophils: 63 %
Platelets: 238 10*3/uL (ref 150–379)
RBC: 4.33 x10E6/uL (ref 4.14–5.80)
RDW: 14.5 % (ref 12.3–15.4)
WBC: 4.5 10*3/uL (ref 3.4–10.8)

## 2017-04-16 ENCOUNTER — Other Ambulatory Visit: Payer: Self-pay

## 2017-04-16 DIAGNOSIS — E1129 Type 2 diabetes mellitus with other diabetic kidney complication: Secondary | ICD-10-CM

## 2017-04-21 ENCOUNTER — Ambulatory Visit (INDEPENDENT_AMBULATORY_CARE_PROVIDER_SITE_OTHER): Payer: PPO | Admitting: Family Medicine

## 2017-04-21 VITALS — BP 111/71 | HR 65 | Ht 72.0 in | Wt 232.8 lb

## 2017-04-21 DIAGNOSIS — I10 Essential (primary) hypertension: Secondary | ICD-10-CM | POA: Diagnosis not present

## 2017-04-21 DIAGNOSIS — E1169 Type 2 diabetes mellitus with other specified complication: Secondary | ICD-10-CM

## 2017-04-21 DIAGNOSIS — E785 Hyperlipidemia, unspecified: Secondary | ICD-10-CM | POA: Diagnosis not present

## 2017-04-21 DIAGNOSIS — E669 Obesity, unspecified: Secondary | ICD-10-CM

## 2017-04-21 DIAGNOSIS — E1129 Type 2 diabetes mellitus with other diabetic kidney complication: Secondary | ICD-10-CM

## 2017-04-21 DIAGNOSIS — E1159 Type 2 diabetes mellitus with other circulatory complications: Secondary | ICD-10-CM | POA: Diagnosis not present

## 2017-04-21 DIAGNOSIS — I152 Hypertension secondary to endocrine disorders: Secondary | ICD-10-CM

## 2017-04-21 LAB — POCT GLYCOSYLATED HEMOGLOBIN (HGB A1C): Hemoglobin A1C: 6.7

## 2017-04-21 NOTE — Progress Notes (Signed)
Impression and Recommendations:    1. 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 Hypertension associated with diabetes ([place])   3. Hyperlipidemia associated with type 2 diabetes mellitus ([facility])   4. Obesity, Class I, BMI 30-34.9       1. DM2 -Today, a1c is 6.7, down from 8.8. This is significantly improved from prior and pt has been making significant lifestyle modifications with diet/exercise. -At last OV we made a health goal of having A1c below 6.5, but after discussion with pt and his commitment to adhering to his lifestyle changes and his A1c dropping from 8.8 to 6.7, we will forego starting meds again at this time.  -Discussed with pt his ideal BMI is <30.  -Goal: A1c < or equal to 6.0 in 3 months.  -told pt to reach his goal, his fasting blood sugars should be less than 100 on a regular basis.  -pt is still enthusiastic about reaching his health goals. He prefers to set goals in order for him to be successful in his healthy lifestyle journey.   2. HTN- -BP is well-controlled at home and in office today. Pt is asymptomatic and tolerating meds well without complication. -continue meds. -continue checking your blood pressure at home and keep a log. Bring this into next OV.   3. Obesity  -Pt is down 21 lbs since 01-12-17. Keep up the great work. -Continue losing weight. Continue diet/exercise.   4. HLD -From last OV we increased lipitor from 54m to 450m He is tolerating this well without complication. -continue meds. -continue diet/exercise.  5. -Nephrologist referral already placed in recent past- pt will check with TyDorothea OgleePY:[id number]-stop ibuprofen/aleve/advil. Take tylenol for arthritis instead.   Orders Placed This Encounter  Procedures  . POCT glycosylated hemoglobin (Hb A1C)    Education and routine counseling performed. Handouts provided.   Return in about 3 months (around 07/21/2017)  for OV- 55m15mo1c at that time.   The patient was counseled, risk factors were discussed, anticipatory guidance given.  Gross side effects, risk and benefits, and alternatives of medications discussed with patient.  Patient is aware that all medications have potential side effects and we are unable to predict every side effect or drug-drug interaction that may occur.  Expresses verbal understanding and consents to current therapy plan and treatment regimen.  Please see AVS handed out to patient at the end of our visit for further patient instructions/ counseling done pertaining to today's office visit.    Note: This document was prepared using Dragon voice recognition software and may include unintentional dictation errors.  Pt was in the office today for 45+ minutes, with over 50% time spent in face to face counseling of patients various medical conditions, treatment plans of those medical conditions including medicine management and lifestyle modification, strategies to improve health and well being; and in coordination of care. SEE ABOVE FOR DETAILS   This document serves as a record of services personally performed by [name] DanceO. It was created on her behalf by [name] trained medical scribe. The creation of this record is based on the scribe's personal observations and the provider's statements to them.   I have reviewed the above medical documentation for accuracy and completeness and I concur.  [name]/21/19 8:19 PM    Subjective:    Chief Complaint  Patient presents with  . Follow-up     [name] Saxon  [name] is a 62 y.o. male who presents to [place] at [facility] today for Diabetes Management, cholesterol, and other issues as listed below  Cholesterol From last OV we increased from 55m to 416mlipitor. He is tolerating this well without complication.  Vit D He takes vitamin D once weekly and feels fine. He is tolerating this well.     Kidney He is in the process of making an appointment with nephrologist. He takes 800 mg BID/TID ibuprofen regularly after his orthopedic surgery.    HTN HPI:  -  His blood pressure has been controlled at home.  Pt is checking it at home.   Bps have been 114/70, 120/70 on average at home.    - Patient reports good compliance with blood pressure medications  - Denies medication S-E   - Smoking Status noted   - He denies new onset of: chest pain, exercise intolerance, shortness of breath, dizziness, visual changes, headache, lower extremity swelling or claudication.    Last 3 blood pressure readings in our office are as follows: BP Readings from Last 3 Encounters:  04/21/17 111/71  04/01/17 120/74  03/24/17 127/78    Filed Weights   04/21/17 1422  Weight: 232 lb 12.8 oz (105.6 kg)     Pre-DM HPI: Last seen 01-12-17 for chronic care. A1c at that time was 8.8 and pt declined meds. We made a goal to reduce A1c below 6.5. If A1c was not below 6.5, we will start meds.  He has been checking his blood sugars at home since he bought a glucometer.  Sugars have been fasting: 113-134 (but he states he ate late that evening) 2 hour postprandial: 177  Today, a1c is 6.7.   Last diabetic eye exam was  Lab Results  Component Value Date   HMDIABEYEEXA No Retinopathy 10/11/2015    Foot exam- UTD  Last A1C in the office was:  Lab Results  Component Value Date   HGBA1C 6.7 04/21/2017   HGBA1C 8.8 (H) 01/08/2017   HGBA1C 6.9 (H) 04/17/2016    Lab Results  Component Value Date   MICROALBUR 80 12/07/2016   LDLCALC 110 (H) 01/08/2017   CREATININE 2.09 (H) 04/14/2017    Last 3 blood pressure readings in our office are as follows: BP Readings from Last 3 Encounters:  04/21/17 111/71  04/01/17 120/74  03/24/17 127/78    BMI Readings from Last 3 Encounters:  04/21/17 31.57 kg/m  04/01/17 30.92 kg/m  03/24/17 32.78 kg/m     Problem  Obesity, Class I, Bmi 30-34.9       Patient Care Team    Relationship Specialty Notifications Start End  OpMellody DanceDO PCP - General Family Medicine  10/28/16   ShRutherford GuysMD Consulting Physician Ophthalmology  11/23/14   NuJovita GammaMD Consulting Physician Neurosurgery  11/23/14   OtKathie RhodesMD Consulting Physician Urology  12/07/14   JaMilus BanisterMD Attending Physician Gastroenterology  12/07/14   SoChesley MiresMD Consulting Physician Pulmonary Disease  12/07/16   WhGarald BaldingMD Consulting Physician Orthopedic Surgery  12/07/16    Comment: back and hip  HuDene GentryMD Consulting Physician Sports Medicine  12/07/16    Comment: back and hip pain     Patient Active Problem List   Diagnosis Date Noted  . Hypertension associated with diabetes (HCGargatha11/26/2013    Priority: High  . Diabetes mellitus with renal manifestation (HCZaleski06/06/2006    Priority: High  .  Hyperlipidemia associated with type 2 diabetes mellitus ([place]) 05/26/2006    Priority: High  . OBSTRUCTIVE SLEEP APNEA 05/26/2006    Priority: High  . Prostatitis 04/20/2014    Priority: Medium  . Renal insufficiency 12/01/2011    Priority: Medium  . 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 Vitamin D deficiency 11/30/2007    Priority: Low  . Obesity, Class I, BMI 30-34.9 04/25/2017  . Noncompliance 03/24/2017  . Abdominal bloating 05/15/2015  . UTI (urinary tract infection) 04/20/2014  . Hyperglycemia 04/20/2014  . Fatigue 03/07/2014  . Family history of early CAD 03/07/2014  . Maxillary sinusitis, acute 12/06/2013  . Tinea versicolor 12/06/2013  . Physical exam 11/06/2013  . Depression 03/29/2013  . Other malaise and fatigue 03/29/2013  . Decreased libido 03/29/2013  . Hematuria 02/17/2013  . Right elbow pain 12/15/2012  . Pustular folliculitis 01/60/1093  . Left ankle pain 03/15/2012  . SPINAL STENOSIS, LUMBAR 08/02/2008  . GOUT 11/30/2007  . BPH (benign prostatic  hyperplasia) 11/30/2007  . ANEMIA NEC 06/11/2006  . Chronic back pain greater than 3 months duration 06/11/2006     Past Medical History:  Diagnosis Date  . Carpal tunnel syndrome, bilateral   . Carpal tunnel syndrome, right    nerve impingement right arm/wears brace  . Diabetes mellitus    no meds  . GERD (gastroesophageal reflux disease)   . Gout   . Hepatic steatosis   . Hyperlipidemia   . Hypertension   . Obstructive sleep apnea      Past Surgical History:  Procedure Laterality Date  . CARPAL TUNNEL RELEASE  09/10/2015   RIGHT WRIST / WITH NERVE IMPINGEMENT SURGERY  . CERVICAL FUSION     Dr Sherwood Gambler  . COLONOSCOPY  2007   negative; [facility]  . ESOPHAGEAL DILATION  2007  . 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 LUMBAR FUSION      Dr Sherwood Gambler     Family History  Problem Relation Age of Onset  . Heart disease Father        pacer  . Urolithiasis Father   . Heart attack Father   . Aneurysm Mother 87       CNS aneurysm  . Sudden death Mother   . Diabetes Sister   . Cancer Maternal Uncle        ? primary  . Cancer Paternal Uncle        ? primary  . Kidney disease Brother   . Heart disease Sister   . Heart disease Sister   . Hyperlipidemia Neg Hx   . Hypertension Neg Hx   . Colon cancer Neg Hx      Social History   Substance and Sexual Activity  Drug Use No  ,  Social History   Substance and Sexual Activity  Alcohol Use No  ,  Social History   Tobacco Use  Smoking Status Former Smoker  . Last attempt to quit: 01/06/1976  . Years since quitting: 41.3  Smokeless Tobacco Never Used  Tobacco Comment   smoked 35 years ago as of 2013   ,    Current Outpatient Medications on File Prior to Visit  Medication Sig Dispense Refill  . allopurinol (ZYLOPRIM) 300 MG tablet TAKE 1/2 TABLET BY MOUTH DAILY 45 tablet 3  . amLODipine (NORVASC) 10 MG tablet TAKE 1/2 TABLET BY MOUTH THREE TIMES PER WEEK 18 tablet 1  .  Ascorbic Acid (VITAMIN C) 1000 MG tablet Take 1,000 mg by mouth daily.    Marland Kitchen aspirin 81 MG tablet Take 81 mg by mouth daily.      Marland Kitchen atorvastatin (LIPITOR) 20 MG tablet     . benazepril (LOTENSIN) 20 MG tablet TAKE 1 TABLET BY MOUTH DAILY 90 tablet 1  . blood glucose meter kit and supplies Dispense based on patient and insurance preference. Use to check fasting blood glucose in the morning and to check glucose 2 hours after largest meal of the day. (FOR ICD-10 E10.9, E11.9). 1 each 0  . FLUoxetine (PROZAC) 40 MG capsule TAKE 1 CAPSULE BY MOUTH DAILY 90 capsule 1  . fluticasone (FLONASE) 50 MCG/ACT nasal spray Place 2 sprays into both nostrils daily. 16 g 6  . gabapentin (NEURONTIN) 800 MG tablet Take 1 tablet (800 mg total) by mouth 3 (three) times daily. 90 tablet 2  . glucose blood test strip Use as instructed 100 each 12  . LANCETS ULTRA FINE MISC Use to check fasting blood glucose in the morning and to check glucose 2 hours after largest meal of the day 100 each 12  . Multiple Vitamins-Iron (MULTIVITAMINS WITH IRON) TABS Take 1 tablet by mouth daily. 30 tablet 0  . MYRBETRIQ 25 MG TB24 tablet     . pantoprazole (PROTONIX) 40 MG tablet TAKE 1 TABLET BY MOUTH DAILY 90 tablet 1  . polyethylene glycol powder (GLYCOLAX/MIRALAX) powder USE AS DIRECTED 850 g 11  . pyridoxine (B-6) 100 MG tablet Take 100 mg by mouth daily.    . traZODone (DESYREL) 50 MG tablet TAKE 1/2 TO 1 TABLET BY MOUTH EVERY NIGHT AT BEDTIME AS NEEDED FOR SLEEP 30 tablet 6  . Vitamin D, Ergocalciferol, (DRISDOL) 50000 units CAPS capsule Take 1 capsule (50,000 Units total) by mouth every 7 (seven) days. 12 capsule 10   No current facility-administered medications on file prior to visit.      Allergies  Allergen Reactions  . Prednisone Other (See Comments)    Caused blood sugar to increase, pt will not take     Review of Systems:   General:  Denies fever, chills Optho/Auditory:   Denies visual changes, blurred  vision Respiratory:   Denies SOB, cough, wheeze, DIB  Cardiovascular:   Denies chest pain, palpitations, painful respirations Gastrointestinal:   Denies nausea, vomiting, diarrhea.  Endocrine:     Denies new hot or cold intolerance Musculoskeletal:  Denies joint swelling, gait issues, or new unexplained myalgias/ arthralgias Skin:  Denies rash, suspicious lesions  Neurological:    Denies dizziness, unexplained weakness, numbness  Psychiatric/Behavioral:   Denies mood changes    Objective:     Blood pressure 111/71, pulse 65, height 6' (1.829 m), weight 232 lb 12.8 oz (105.6 kg), SpO2 97 %.  Body mass index is 31.57 kg/m.  General: Well Developed, well nourished, and in no acute distress.  HEENT: Normocephalic, atraumatic, pupils equal round reactive to light, neck supple, No carotid bruits, no JVD Skin: Warm and dry, cap RF less 2 sec Cardiac: Regular rate and rhythm, S1, S2 WNL's, no murmurs rubs or gallops Respiratory: ECTA B/L, Not using accessory muscles, speaking in full sentences. NeuroM-Sk: Ambulates w/o assistance, moves ext * 4 w/o difficulty, sensation grossly intact.  Ext: scant edema b/l lower ext Psych: No HI/SI, judgement and insight good, Euthymic mood. Full Affect.

## 2017-04-21 NOTE — Patient Instructions (Addendum)
Muscle milk light - 100cal  No advil/ ibuprofen , alleve/ naprosyn   - speak with Dorothea Ogle about the Nephrologist referral      Risk factors for prediabetes and type 2 diabetes  Researchers don't fully understand why some people develop prediabetes and type 2 diabetes and others don't.  It's clear that certain factors increase the risk, however, including:  Weight. The more fatty tissue you have, the more resistant your cells become to insulin.  Inactivity. The less active you are, the greater your risk. Physical activity helps you control your weight, uses up glucose as energy and makes your cells more sensitive to insulin.  Family history. Your risk increases if a parent or sibling has type 2 diabetes.  Race. Although it's unclear why, people of certain races - including blacks, Hispanics, American Indians and Asian-Americans - are at higher risk.  Age. Your risk increases as you get older. This may be because you tend to exercise less, lose muscle mass and gain weight as you age. But type 2 diabetes is also increasing dramatically among children, adolescents and younger adults.  Gestational diabetes. If you developed gestational diabetes when you were pregnant, your risk of developing prediabetes and type 2 diabetes later increases. If you gave birth to a baby weighing more than 9 pounds (4 kilograms), you're also at risk of type 2 diabetes.  Polycystic ovary syndrome. For women, having polycystic ovary syndrome - a common condition characterized by irregular menstrual periods, excess hair growth and obesity - increases the risk of diabetes.  High blood pressure. Having blood pressure over 140/90 millimeters of mercury (mm Hg) is linked to an increased risk of type 2 diabetes.  Abnormal cholesterol and triglyceride levels. If you have low levels of high-density lipoprotein (HDL), or "good," cholesterol, your risk of type 2 diabetes is higher. Triglycerides are another type of fat carried in the  blood. People with high levels of triglycerides have an increased risk of type 2 diabetes. Your doctor can let you know what your cholesterol and triglyceride levels are.  A good guide to good carbs: The glycemic index ---If you have diabetes, or at risk for diabetes, you know all too well that when you eat carbohydrates, your blood sugar goes up. The total amount of carbs you consume at a meal or in a snack mostly determines what your blood sugar will do. But the food itself also plays a role. A serving of white rice has almost the same effect as eating pure table sugar - a quick, high spike in blood sugar. A serving of lentils has a slower, smaller effect.  ---Picking good sources of carbs can help you control your blood sugar and your weight. Even if you don't have diabetes, eating healthier carbohydrate-rich foods can help ward off a host of chronic conditions, from heart disease to various cancers to, well, diabetes.  ---One way to choose foods is with the glycemic index (GI). This tool measures how much a food boosts blood sugar.  The glycemic index rates the effect of a specific amount of a food on blood sugar compared with the same amount of pure glucose. A food with a glycemic index of 28 boosts blood sugar only 28% as much as pure glucose. One with a GI of 95 acts like pure glucose.    High glycemic foods result in a quick spike in insulin and blood sugar (also known as blood glucose).  Low glycemic foods have a slower, smaller effect- these are healthier  for you.   Using the glycemic index Using the glycemic index is easy: choose foods in the low GI category instead of those in the high GI category (see below), and go easy on those in between. Low glycemic index (GI of 55 or less): Most fruits and vegetables, beans, minimally processed grains, pasta, low-fat dairy foods, and nuts.  Moderate glycemic index (GI 56 to 69): White and sweet potatoes, corn, white rice, couscous, breakfast  cereals such as Cream of Wheat and Mini Wheats.  High glycemic index (GI of 70 or higher): White bread, rice cakes, most crackers, bagels, cakes, doughnuts, croissants, most packaged breakfast cereals. You can see the values for 100 commons foods and get links to more at www.health.CheapToothpicks.si.  Swaps for lowering glycemic index  Instead of this high-glycemic index food Eat this lower-glycemic index food  White rice Brown rice or converted rice  Instant oatmeal Steel-cut oats  Cornflakes Bran flakes  Baked potato Pasta, bulgur  White bread Whole-grain bread  Corn Peas or leafy greens       Prediabetes Eating Plan  Prediabetes--also called impaired glucose tolerance or impaired fasting glucose--is a condition that causes blood sugar (blood glucose) levels to be higher than normal. Following a healthy diet can help to keep prediabetes under control. It can also help to lower the risk of type 2 diabetes and heart disease, which are increased in people who have prediabetes. Along with regular exercise, a healthy diet:  Promotes weight loss.  Helps to control blood sugar levels.  Helps to improve the way that the body uses insulin.   WHAT DO I NEED TO KNOW ABOUT THIS EATING PLAN?   Use the glycemic index (GI) to plan your meals. The index tells you how quickly a food will raise your blood sugar. Choose low-GI foods. These foods take a longer time to raise blood sugar.  Pay close attention to the amount of carbohydrates in the food that you eat. Carbohydrates increase blood sugar levels.  Keep track of how many calories you take in. Eating the right amount of calories will help you to achieve a healthy weight. Losing about 7 percent of your starting weight can help to prevent type 2 diabetes.  You may want to follow a Mediterranean diet. This diet includes a lot of vegetables, lean meats or fish, whole grains, fruits, and healthy oils and fats.   WHAT FOODS CAN I  EAT?  Grains Whole grains, such as whole-wheat or whole-grain breads, crackers, cereals, and pasta. Unsweetened oatmeal. Bulgur. Barley. Quinoa. Brown rice. Corn or whole-wheat flour tortillas or taco shells. Vegetables Lettuce. Spinach. Peas. Beets. Cauliflower. Cabbage. Broccoli. Carrots. Tomatoes. Squash. Eggplant. Herbs. Peppers. Onions. Cucumbers. Brussels sprouts. Fruits Berries. Bananas. Apples. Oranges. Grapes. Papaya. Mango. Pomegranate. Kiwi. Grapefruit. Cherries. Meats and Other Protein Sources Seafood. Lean meats, such as chicken and [place] or lean cuts of pork and beef. Tofu. Eggs. Nuts. Beans. Dairy Low-fat or fat-free dairy products, such as yogurt, cottage cheese, and cheese. Beverages Water. Tea. Coffee. Sugar-free or diet soda. Seltzer water. Milk. Milk alternatives, such as soy or almond milk. Condiments Mustard. Relish. Low-fat, low-sugar ketchup. Low-fat, low-sugar barbecue sauce. Low-fat or fat-free mayonnaise. Sweets and Desserts Sugar-free or low-fat pudding. Sugar-free or low-fat ice cream and other frozen treats. Fats and Oils Avocado. Walnuts. Olive oil. The items listed above may not be a complete list of recommended foods or beverages. Contact your dietitian for more options.    WHAT FOODS ARE NOT RECOMMENDED?  Grains  Refined white flour and flour products, such as bread, pasta, snack foods, and cereals. Beverages Sweetened drinks, such as sweet iced tea and soda. Sweets and Desserts Baked goods, such as cake, cupcakes, pastries, cookies, and cheesecake. The items listed above may not be a complete list of foods and beverages to avoid. Contact your dietitian for more information.   This information is not intended to replace advice given to you by your health care provider. Make sure you discuss any questions you have with your health care provider.   Document Released: 05/08/2014 Document Reviewed: 05/08/2014 Elsevier Interactive Patient Education  [employer].

## 2017-04-22 DIAGNOSIS — G4733 Obstructive sleep apnea (adult) (pediatric): Secondary | ICD-10-CM | POA: Diagnosis not present

## 2017-04-25 DIAGNOSIS — E663 Overweight: Secondary | ICD-10-CM | POA: Insufficient documentation

## 2017-04-29 NOTE — Telephone Encounter (Signed)
CPAP compliance discussed at the 3.13.19 office visit with VS Pt advised to follow up in 1 year Nothing further needed; will sign off

## 2017-05-03 DIAGNOSIS — G4733 Obstructive sleep apnea (adult) (pediatric): Secondary | ICD-10-CM | POA: Diagnosis not present

## 2017-05-11 DIAGNOSIS — Z125 Encounter for screening for malignant neoplasm of prostate: Secondary | ICD-10-CM | POA: Diagnosis not present

## 2017-05-18 ENCOUNTER — Other Ambulatory Visit: Payer: Self-pay | Admitting: Family Medicine

## 2017-05-19 DIAGNOSIS — Z125 Encounter for screening for malignant neoplasm of prostate: Secondary | ICD-10-CM | POA: Diagnosis not present

## 2017-05-19 DIAGNOSIS — N400 Enlarged prostate without lower urinary tract symptoms: Secondary | ICD-10-CM | POA: Diagnosis not present

## 2017-05-22 DIAGNOSIS — G4733 Obstructive sleep apnea (adult) (pediatric): Secondary | ICD-10-CM | POA: Diagnosis not present

## 2017-06-11 ENCOUNTER — Other Ambulatory Visit: Payer: Self-pay | Admitting: Family Medicine

## 2017-06-22 DIAGNOSIS — G4733 Obstructive sleep apnea (adult) (pediatric): Secondary | ICD-10-CM | POA: Diagnosis not present

## 2017-06-30 ENCOUNTER — Other Ambulatory Visit: Payer: Self-pay | Admitting: Family Medicine

## 2017-07-05 ENCOUNTER — Other Ambulatory Visit: Payer: Self-pay | Admitting: Family Medicine

## 2017-07-06 ENCOUNTER — Other Ambulatory Visit: Payer: Self-pay | Admitting: Family Medicine

## 2017-07-07 ENCOUNTER — Other Ambulatory Visit: Payer: Self-pay

## 2017-07-07 MED ORDER — BENAZEPRIL HCL 20 MG PO TABS: 20.0000 mg | ORAL_TABLET | Freq: Every day | ORAL | 1 refills | Status: DC

## 2017-07-07 NOTE — Telephone Encounter (Signed)
Pharmacy sent refill request for benazepril. Reviewed chart medication was last filled by a previous provider.  LOV 04/21/17. MPulliam, CMA/RT(R)

## 2017-07-22 DIAGNOSIS — G4733 Obstructive sleep apnea (adult) (pediatric): Secondary | ICD-10-CM | POA: Diagnosis not present

## 2017-07-23 ENCOUNTER — Other Ambulatory Visit: Payer: PPO

## 2017-07-23 ENCOUNTER — Other Ambulatory Visit: Payer: Self-pay

## 2017-07-23 DIAGNOSIS — N289 Disorder of kidney and ureter, unspecified: Secondary | ICD-10-CM | POA: Diagnosis not present

## 2017-07-23 DIAGNOSIS — E1121 Type 2 diabetes mellitus with diabetic nephropathy: Secondary | ICD-10-CM | POA: Diagnosis not present

## 2017-07-23 DIAGNOSIS — D6489 Other specified anemias: Secondary | ICD-10-CM

## 2017-07-23 DIAGNOSIS — Z794 Long term (current) use of insulin: Principal | ICD-10-CM

## 2017-07-24 LAB — COMPREHENSIVE METABOLIC PANEL
ALT: 20 IU/L (ref 0–44)
AST: 23 IU/L (ref 0–40)
Albumin/Globulin Ratio: 1.7 (ref 1.2–2.2)
Albumin: 4.4 g/dL (ref 3.6–4.8)
Alkaline Phosphatase: 91 IU/L (ref 39–117)
BUN/Creatinine Ratio: 18 (ref 10–24)
BUN: 34 mg/dL — ABNORMAL HIGH (ref 8–27)
Bilirubin Total: 0.3 mg/dL (ref 0.0–1.2)
CO2: 24 mmol/L (ref 20–29)
Calcium: 9.7 mg/dL (ref 8.6–10.2)
Chloride: 100 mmol/L (ref 96–106)
Creatinine, Ser: 1.86 mg/dL — ABNORMAL HIGH (ref 0.76–1.27)
GFR calc Af Amer: 44 mL/min/{1.73_m2} — ABNORMAL LOW (ref >59)
GFR calc non Af Amer: 38 mL/min/{1.73_m2} — ABNORMAL LOW (ref >59)
Globulin, Total: 2.6 g/dL (ref 1.5–4.5)
Glucose: 132 mg/dL — ABNORMAL HIGH (ref 65–99)
Potassium: 4.8 mmol/L (ref 3.5–5.2)
Sodium: 137 mmol/L (ref 134–144)
Total Protein: 7 g/dL (ref 6.0–8.5)

## 2017-07-24 LAB — CBC WITH DIFFERENTIAL/PLATELET
Basophils Absolute: 0 10*3/uL (ref 0.0–0.2)
Basos: 1 %
EOS (ABSOLUTE): 0.1 10*3/uL (ref 0.0–0.4)
Eos: 3 %
Hematocrit: 40.1 % (ref 37.5–51.0)
Hemoglobin: 12.7 g/dL — ABNORMAL LOW (ref 13.0–17.7)
Immature Grans (Abs): 0 10*3/uL (ref 0.0–0.1)
Immature Granulocytes: 0 %
Lymphocytes Absolute: 1.1 10*3/uL (ref 0.7–3.1)
Lymphs: 21 %
MCH: 26.6 pg (ref 26.6–33.0)
MCHC: 31.7 g/dL (ref 31.5–35.7)
MCV: 84 fL (ref 79–97)
Monocytes Absolute: 0.4 10*3/uL (ref 0.1–0.9)
Monocytes: 8 %
Neutrophils Absolute: 3.3 10*3/uL (ref 1.4–7.0)
Neutrophils: 67 %
Platelets: 214 10*3/uL (ref 150–450)
RBC: 4.77 x10E6/uL (ref 4.14–5.80)
RDW: 13 % (ref 12.3–15.4)
WBC: 5 10*3/uL (ref 3.4–10.8)

## 2017-07-24 LAB — HEMOGLOBIN A1C
Est. average glucose Bld gHb Est-mCnc: 134 mg/dL
Hgb A1c MFr Bld: 6.3 % — ABNORMAL HIGH (ref 4.8–5.6)

## 2017-07-26 ENCOUNTER — Ambulatory Visit (INDEPENDENT_AMBULATORY_CARE_PROVIDER_SITE_OTHER): Payer: PPO | Admitting: Family Medicine

## 2017-07-26 ENCOUNTER — Encounter: Payer: Self-pay | Admitting: Family Medicine

## 2017-07-26 VITALS — BP 113/76 | HR 65 | Ht 71.0 in | Wt 224.0 lb

## 2017-07-26 DIAGNOSIS — E559 Vitamin D deficiency, unspecified: Secondary | ICD-10-CM | POA: Diagnosis not present

## 2017-07-26 DIAGNOSIS — I10 Essential (primary) hypertension: Secondary | ICD-10-CM

## 2017-07-26 DIAGNOSIS — M25552 Pain in left hip: Secondary | ICD-10-CM

## 2017-07-26 DIAGNOSIS — M545 Low back pain, unspecified: Secondary | ICD-10-CM

## 2017-07-26 DIAGNOSIS — G8929 Other chronic pain: Secondary | ICD-10-CM

## 2017-07-26 DIAGNOSIS — E785 Hyperlipidemia, unspecified: Secondary | ICD-10-CM

## 2017-07-26 DIAGNOSIS — E1169 Type 2 diabetes mellitus with other specified complication: Secondary | ICD-10-CM

## 2017-07-26 DIAGNOSIS — F39 Unspecified mood [affective] disorder: Secondary | ICD-10-CM

## 2017-07-26 DIAGNOSIS — M47817 Spondylosis without myelopathy or radiculopathy, lumbosacral region: Secondary | ICD-10-CM

## 2017-07-26 DIAGNOSIS — E1159 Type 2 diabetes mellitus with other circulatory complications: Secondary | ICD-10-CM | POA: Diagnosis not present

## 2017-07-26 DIAGNOSIS — E669 Obesity, unspecified: Secondary | ICD-10-CM

## 2017-07-26 DIAGNOSIS — M5137 Other intervertebral disc degeneration, lumbosacral region: Secondary | ICD-10-CM

## 2017-07-26 DIAGNOSIS — E1121 Type 2 diabetes mellitus with diabetic nephropathy: Secondary | ICD-10-CM

## 2017-07-26 DIAGNOSIS — E1122 Type 2 diabetes mellitus with diabetic chronic kidney disease: Secondary | ICD-10-CM | POA: Insufficient documentation

## 2017-07-26 DIAGNOSIS — F900 Attention-deficit hyperactivity disorder, predominantly inattentive type: Secondary | ICD-10-CM | POA: Diagnosis not present

## 2017-07-26 DIAGNOSIS — N184 Chronic kidney disease, stage 4 (severe): Secondary | ICD-10-CM

## 2017-07-26 DIAGNOSIS — I152 Hypertension secondary to endocrine disorders: Secondary | ICD-10-CM

## 2017-07-26 DIAGNOSIS — E66811 Obesity, class 1: Secondary | ICD-10-CM

## 2017-07-26 DIAGNOSIS — M51379 Other intervertebral disc degeneration, lumbosacral region without mention of lumbar back pain or lower extremity pain: Secondary | ICD-10-CM

## 2017-07-26 MED ORDER — AMPHETAMINE-DEXTROAMPHET ER 20 MG PO CP24: 20.0000 mg | ORAL_CAPSULE | ORAL | 0 refills | Status: DC

## 2017-07-26 NOTE — Progress Notes (Signed)
Impression and Recommendations:    1. Type 2 diabetes mellitus with other specified complication, without long-term current use of insulin ([place])   2. 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 Hypertension associated with diabetes ([place])   4. Hyperlipidemia associated with type 2 diabetes mellitus ([place])   5. Mood disorder ([facility])   6. Obesity, Class I, BMI 30-34.9   7. Vitamin D deficiency   8. Chronic left hip pain   9. Facet arthropathy, lumbosacral   10. DDD (degenerative disc disease), lumbosacral   11. Chronic left-sided low back pain without sciatica   12. Attention deficit hyperactivity disorder (ADHD), predominantly inattentive type    H/o noncompliance - Pt much more compliant and doing better.  1. Diabetes Mellitus - Symptoms remain well-controlled at this time. - No change in treatment plan recommended today.  See med list below. - Patient tolerating meds well without complication.  Denies S-E  - Counseled patient on pathophysiology of disease and discussed various treatment options, including continued dietary and lifestyle modifications as first line.  Importance of low carb/ketogenic diet discussed with patient in addition to regular exercise.   - Check FBS and 2 hours after the biggest meal of your day.  Keep log and bring in next OV for my review.   Also, if you ever feel poorly, please check your blood pressure and blood sugar, as one or the other could be the cause of your symptoms.  - Being a diabetic, you need yearly eye and foot exams. Make appt.for diabetic eye exam.  2. Glomerular Disorder Assoc. With DM w/ Stage 4 CKD: - Recent labs reviewed with patient today. - Reviewed goal of serum creatinine as 1.2 or less.  - Dehydrated.  Strongly encouraged patient to adequately hydrate.  3. Hypertension - BP remains well-controlled at this time. - No change in treatment plan recommended today.  See med  list below. - Patient tolerating meds well without complication.  Denies S-E  Ambulatory BP monitoring encouraged. Keep log and bring in next OV.  4. DM w/ Hyperlipidemia - h/o elevated LDL - Requested to CMA to add on fasting lipid profile to recent lab work. - Patient tolerating meds well without complication.  Denies S-E - Continue treatment plan as directed.  See med list below.  The 10-year ASCVD risk score Mikey Bussing DC Brooke Bonito., et al., 2013) is: 21%   Values used to calculate the score:     Age: 62 years     Sex: Male     Is Non-Hispanic African American: Yes     Diabetic: Yes     Tobacco smoker: No     Systolic Blood Pressure: 409 mmHg     Is BP treated: Yes     HDL Cholesterol: 42 mg/dL     Total Cholesterol: 170 mg/dL  - Dietary changes such as low saturated & trans fat and low carb/ ketogenic diets discussed with patient.  Encouraged regular exercise and weight loss when appropriate.   5. Mood Disorder - Sx well-controlled at this time.  - Insomnia/sleep - Reviewed sleep hygiene with the patient.  Advised patient to avoid sleeping during the day. - Continue using CPAP and trazodone as directed for sleep  6. Attention/Focus - Stimulant prescribed today.  See med list below. - Risks & benefits reviewed with the patient, along with S-E to watch out for.  - Patient was managed on OTC Focus Factor for years  and years. - His 4 biological children have been diagnosed with ADD/ADHD.  7. Chronic Pain in Back/Hip - Degenerative Disc Disease & Facet Arthropathy - Reviewed MRI  Patient desires referral to pain management for his back and hip pain. - Referral provided at patient's request.  Pt had injections without relief in past.  MRI Impression 2018 - HIP LEFT WO CONTRAST: 1. Moderate bilateral hip joint degenerative changes but no stress fracture or AVN. 2. Mild bilateral peritendinosis but no tendon tear or trochanteric bursitis. 3. Bilateral hamstring tendinopathy. 4. No  significant intrapelvic abnormalities. 5. Advanced degenerative disc disease and facet disease noted at L5-S1.  MRI Impression 2013 - LUMBAR SPINE W WO CONTRAST: 1.  No significant change in the appearance of the lumbar spine since the prior exam. 2.  Slight far lateral bulge of the L3-4 disc to the left without neural impingement. 3.  Retrolisthesis and broad-based disc bulge at L5 S1 without neural impingement.  Gas in the spinal canal on the right at S1 without neural impingement, unchanged.  8. BMI Counseling Explained to patient what BMI refers to, and what it means medically.    Told patient to think about it as a "medical risk stratification measurement" and how increasing BMI is associated with increasing risk/ or worsening state of various diseases such as hypertension, hyperlipidemia, diabetes, premature OA, depression etc.  American Heart Association guidelines for healthy diet, basically Mediterranean diet, and exercise guidelines of 30 minutes 5 days per week or more discussed in detail.  Health counseling performed.  All questions answered.  9. Lifestyle & Preventative Health Maintenance - Advised patient to continue working toward exercising to improve overall mental, physical, and emotional health.    - Continue to engage in daily physical activity/formal exercise.  Recommended that the patient eventually strive for at least 150 minutes of moderate cardiovascular activity per week according to guidelines established by the [facility].   - Healthy dietary habits encouraged, including low-carb, and high amounts of lean protein in diet.   - Patient should also consume adequate amounts of water - half of body weight in oz of water per day.   Education and routine counseling performed. Handouts provided.  10. Follow-Up - Return in 4 weeks to follow-up on newly prescribed ADHD med.  - Otherwise, return in 4 months for chronic DM management.  Pt was in the office today for  32.5+ minutes, with over 50% time spent in face to face counseling of patients various medical conditions, treatment plans of those medical conditions including medicine management and lifestyle modification, strategies to improve health and well being; and in coordination of care. SEE ABOVE TREATMENT PLAN FOR DETAILS   Orders Placed This Encounter  Procedures  . Ambulatory referral to Pain Clinic   There are no discontinued medications.  Meds ordered this encounter  Medications  . amphetamine-dextroamphetamine (ADDERALL XR) 20 MG 24 hr capsule    Sig: Take 1 capsule (20 mg total) by mouth every morning.    Dispense:  30 capsule    Refill:  0    Return for 4wks- started adderall; Then q 23mo DM, CKD, BP, Chol.   The patient was counseled, risk factors were discussed, anticipatory guidance given.  Gross side effects, risk and benefits, and alternatives of medications discussed with patient.  Patient is aware that all medications have potential side effects and we are unable to predict every side effect or drug-drug interaction that may occur.  Expresses verbal understanding and consents to current  therapy plan and treatment regimen.  Please see AVS handed out to patient at the end of our visit for further patient instructions/ counseling done pertaining to today's office visit.    Note: This document was prepared using Dragon voice recognition software and may include unintentional dictation errors.  This document serves as a record of services personally performed by Mellody Dance, DO. It was created on her behalf by [name], a trained medical scribe. The creation of this record is based on the scribe's personal observations and the provider's statements to them.   I have reviewed the above medical documentation for accuracy and completeness and I concur.  Mellody Dance 08/01/17 9:42 PM     Subjective:    Chief Complaint  Patient presents with  . Follow-up     [name] is a 62 y.o. male who presents to [facility] today for Diabetes Management.    Patient states that nothing's new since his last visit.  States "I've been doing pretty well."  Patient does not have a kidney specialist.  He's taking Vitamin D once weekly and a multivitamin with iron.  Mood Patient states that he feels a great deal of interest in activities and feels very active.  Denies feeling depressed or hopeless.  Notes he initially misread the PHQ.  Continues wearing his sleep apnea mask and taking trazodone to help him sleep.  He still has issues sleeping at night, but notes that this may be due to his sleep mask and also his habit of sleeping in his recliner during the day.  Focus Patient notes having some issues with focus, especially while reading. He is currently studying to complete his high school diploma/GED. States he used to take "Focus Factor" OTC for years.  Chronic Pain, Left Back & Hip Patient has had two back surgeries and notes chronic pain in his back and hip. His pain is located more in his left back and left hip.  Patient states that his hip is the most painful site at present. It's been a year since his last injection.  DM HPI: -  He has been working on diet and exercise for diabetes.  Patient states that he's lost a few pounds.  Notes he's been feeling better.  Patient exercises every day, walks, and uses exercise machines. Patient feels his fatigue has improved since exercising/losing weight.  Pt is currently maintained on the following medications for diabetes:   see med list today Medication compliance - Continues medications as prescribed.  Home glucose readings range: Stable.   Denies polyuria/polydipsia. Denies hypo/ hyperglycemia symptoms - He denies new onset of: chest pain, exercise intolerance, shortness of breath, dizziness, visual changes, headache, lower extremity swelling or claudication.    Last diabetic eye exam was  Lab Results  Component Value Date   HMDIABEYEEXA No Retinopathy 10/11/2015    Foot exam- UTD  Last A1C in the office was:  Lab Results  Component Value Date   HGBA1C 6.3 (H) 07/23/2017   HGBA1C 6.7 04/21/2017   HGBA1C 8.8 (H) 01/08/2017    Lab Results  Component Value Date   MICROALBUR 80 12/07/2016   LDLCALC 78 07/23/2017   CREATININE 1.86 (H) 07/23/2017    Depression screen PHQ 2/9 07/26/2017 04/21/2017 03/24/2017 01/12/2017 12/07/2016  Decreased Interest 0 0 0 0 0  Down, Depressed, Hopeless 0 0 0 1 0  PHQ - 2 Score 0 0 0 1 0  Altered sleeping 2 0 0 1 3  Tired, decreased energy 2 0 0 2 1  Change in appetite 0 0 0 0 0  Feeling bad or failure about yourself  0 0 0 0 0  Trouble concentrating 3 0 3 0 3  Moving slowly or fidgety/restless 0 0 0 0 0  Suicidal thoughts 0 0 0 0 0  PHQ-9 Score 7 0 '3 4 7  ' Difficult doing work/chores - Not difficult at all Somewhat difficult Not difficult at all Not difficult at all    Last 3 blood pressure readings in our office are as follows: BP Readings from Last 3 Encounters:  07/26/17 113/76  04/21/17 111/71  04/01/17 120/74    BMI Readings from Last 3 Encounters:  07/26/17 31.24 kg/m  04/21/17 31.57 kg/m  04/01/17 30.92 kg/m     No problems updated.    Patient Care Team    Relationship Specialty Notifications Start End  Mellody Dance, DO PCP - General Family Medicine  10/28/16   Rutherford Guys, MD Consulting Physician Ophthalmology  11/23/14   Jovita Gamma, MD Consulting Physician Neurosurgery  11/23/14   Kathie Rhodes, MD Consulting Physician Urology  12/07/14   Milus Banister, MD Attending Physician Gastroenterology  12/07/14   Chesley Mires, MD Consulting Physician Pulmonary Disease  12/07/16   Garald Balding, MD Consulting Physician Orthopedic Surgery  12/07/16    Comment: back and hip  Dene Gentry, MD Consulting Physician Sports Medicine  12/07/16    Comment: back and hip  pain     Patient Active Problem List   Diagnosis Date Noted  . Hypertension associated with diabetes ([place]) 12/01/2011    Priority: High  . Diabetes mellitus with renal manifestation ([place]) 06/11/2006    Priority: High  . Hyperlipidemia associated with type 2 diabetes mellitus ([place]) 05/26/2006    Priority: High  . OBSTRUCTIVE SLEEP APNEA 05/26/2006    Priority: High  . Attention deficit hyperactivity disorder (ADHD), predominantly inattentive type 07/26/2017    Priority: Medium  . Prostatitis 04/20/2014    Priority: Medium  . Mood disorder ([place]) 03/29/2013    Priority: Medium  . Renal insufficiency 12/01/2011    Priority: Medium  . 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 Vitamin D deficiency 11/30/2007    Priority: Low  . Chronic left hip pain 07/26/2017  . Facet arthropathy, lumbosacral 07/26/2017  . DDD (degenerative disc disease), lumbosacral 07/26/2017  . 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 Obesity, Class I, BMI 30-34.9 04/25/2017  . Noncompliance 03/24/2017  . Abdominal bloating 05/15/2015  . UTI (urinary tract infection) 04/20/2014  . Hyperglycemia 04/20/2014  . Fatigue 03/07/2014  . Family history of early CAD 03/07/2014  . Maxillary sinusitis, acute 12/06/2013  . Tinea versicolor 12/06/2013  . Physical exam 11/06/2013  . Other malaise and fatigue 03/29/2013  . Decreased libido 03/29/2013  . Hematuria 02/17/2013  . Right elbow pain 12/15/2012  . Pustular folliculitis 43/32/9518  . Left ankle pain 03/15/2012  . SPINAL STENOSIS, LUMBAR 08/02/2008  . GOUT 11/30/2007  . BPH (benign prostatic hyperplasia) 11/30/2007  . ANEMIA NEC 06/11/2006  . Chronic back pain greater than 3 months duration 06/11/2006     Past Medical History:  Diagnosis Date  . Carpal tunnel syndrome, bilateral   . Carpal tunnel syndrome, right    nerve impingement right arm/wears  brace  . Diabetes mellitus    no meds  . GERD (gastroesophageal reflux  disease)   . Gout   . Hepatic steatosis   . Hyperlipidemia   . Hypertension   . Obstructive sleep apnea      Past Surgical History:  Procedure Laterality Date  . CARPAL TUNNEL RELEASE  09/10/2015   RIGHT WRIST / WITH NERVE IMPINGEMENT SURGERY  . CERVICAL FUSION     Dr Sherwood Gambler  . COLONOSCOPY  2007   negative; [facility]  . ESOPHAGEAL DILATION  2007  . 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 LUMBAR FUSION      Dr Sherwood Gambler     Family History  Problem Relation Age of Onset  . Heart disease Father        pacer  . Urolithiasis Father   . Heart attack Father   . Aneurysm Mother 30       CNS aneurysm  . Sudden death Mother   . Diabetes Sister   . Cancer Maternal Uncle        ? primary  . Cancer Paternal Uncle        ? primary  . Kidney disease Brother   . Heart disease Sister   . Heart disease Sister   . Hyperlipidemia Neg Hx   . Hypertension Neg Hx   . Colon cancer Neg Hx      Social History   Substance and Sexual Activity  Drug Use No  ,  Social History   Substance and Sexual Activity  Alcohol Use No  ,  Social History   Tobacco Use  Smoking Status Former Smoker  . Last attempt to quit: 01/06/1976  . Years since quitting: 41.5  Smokeless Tobacco Never Used  Tobacco Comment   smoked 35 years ago as of 2013   ,    Current Outpatient Medications on File Prior to Visit  Medication Sig Dispense Refill  . allopurinol (ZYLOPRIM) 300 MG tablet TAKE 1/2 TABLET BY MOUTH DAILY 45 tablet 3  . amLODipine (NORVASC) 10 MG tablet TAKE 1/2 TABLET BY MOUTH THREE TIMES PER WEEK 18 tablet 1  . Ascorbic Acid (VITAMIN C) 1000 MG tablet Take 1,000 mg by mouth daily.    Marland Kitchen aspirin 81 MG tablet Take 81 mg by mouth daily.      Marland Kitchen atorvastatin (LIPITOR) 20 MG tablet     . benazepril (LOTENSIN) 20 MG tablet Take 1 tablet (20 mg total) by mouth daily. 90 tablet 1  . blood  glucose meter kit and supplies Dispense based on patient and insurance preference. Use to check fasting blood glucose in the morning and to check glucose 2 hours after largest meal of the day. (FOR ICD-10 E10.9, E11.9). 1 each 0  . FLUoxetine (PROZAC) 40 MG capsule TAKE 1 CAPSULE BY MOUTH DAILY 90 capsule 1  . fluticasone (FLONASE) 50 MCG/ACT nasal spray Place 2 sprays into both nostrils daily. 16 g 6  . gabapentin (NEURONTIN) 800 MG tablet TAKE 1 TABLET BY MOUTH THREE TIMES DAILY 90 tablet 2  . glucose blood test strip Use as instructed 100 each 12  . LANCETS ULTRA FINE MISC Use to check fasting blood glucose in the morning and to check glucose 2 hours after largest meal of the day 100 each 12  . Multiple Vitamins-Iron (MULTIVITAMINS WITH IRON) TABS Take 1 tablet by mouth daily. 30 tablet 0  . MYRBETRIQ 25 MG TB24 tablet     . pantoprazole (PROTONIX) 40 MG tablet TAKE 1 TABLET BY MOUTH DAILY 90 tablet 1  .  polyethylene glycol powder (GLYCOLAX/MIRALAX) powder USE AS DIRECTED 850 g 11  . pyridoxine (B-6) 100 MG tablet Take 100 mg by mouth daily.    . traZODone (DESYREL) 50 MG tablet TAKE 1/2 TO 1 TABLET BY MOUTH EVERY NIGHT AT BEDTIME AS NEEDED FOR SLEEP 30 tablet 6  . Vitamin D, Ergocalciferol, (DRISDOL) 50000 units CAPS capsule Take 1 capsule (50,000 Units total) by mouth every 7 (seven) days. 12 capsule 10   No current facility-administered medications on file prior to visit.      Allergies  Allergen Reactions  . Prednisone Other (See Comments)    Caused blood sugar to increase, pt will not take     Review of Systems:   General:  Denies fever, chills Optho/Auditory:   Denies visual changes, blurred vision Respiratory:   Denies SOB, cough, wheeze, DIB  Cardiovascular:   Denies chest pain, palpitations, painful respirations Gastrointestinal:   Denies nausea, vomiting, diarrhea.  Endocrine:     Denies new hot or cold intolerance Musculoskeletal:  Denies joint swelling, gait issues, or  new unexplained myalgias/ arthralgias Skin:  Denies rash, suspicious lesions  Neurological:    Denies dizziness, unexplained weakness, numbness  Psychiatric/Behavioral:   Denies mood changes    Objective:     Blood pressure 113/76, pulse 65, height '5\' 11"'  (1.803 m), weight 224 lb (101.6 kg), SpO2 98 %.  Body mass index is 31.24 kg/m.  General: Well Developed, well nourished, and in no acute distress.  HEENT: Normocephalic, atraumatic, pupils equal round reactive to light, neck supple, No carotid bruits, no JVD Skin: Warm and dry, cap RF less 2 sec Cardiac: Regular rate and rhythm, S1, S2 WNL's, no murmurs rubs or gallops Respiratory: ECTA B/L, Not using accessory muscles, speaking in full sentences. NeuroM-Sk: Ambulates w/o assistance, moves ext * 4 w/o difficulty, sensation grossly intact.  Ext: scant edema b/l lower ext Psych: No HI/SI, judgement and insight good, Euthymic mood. Full Affect.

## 2017-07-26 NOTE — Patient Instructions (Addendum)
Please start the Adderall that I gave you when you first wake up in the morning after eating something.  Please let me know before a 4-week follow-up if you are having any troubles with this medicine.  Melissa to add on FLP to recently draw [email or web address]    Living With Attention Deficit Hyperactivity Disorder If you have been diagnosed with attention deficit hyperactivity disorder (ADHD), you may be relieved that you now know why you have felt or behaved a certain way. Still, you may feel overwhelmed about the treatment ahead. You may also wonder how to get the support you need and how to deal with the condition day-to-day. With treatment and support, you can live with ADHD and manage your symptoms. How to manage lifestyle changes Managing stress Stress is your body's reaction to life changes and events, both good and bad. To cope with the stress of an ADHD diagnosis, it may help to:  Learn more about ADHD.  Exercise regularly. Even a short daily walk can lower stress levels.  Participate in training or education programs (including social skills training classes) that teach you to deal with symptoms.  Medicines Your health care provider may suggest certain medicines if he or she feels that they will help to improve your condition. Stimulant medicines are usually prescribed to treat ADHD, and therapy may also be prescribed. It is important to:  Avoid using alcohol and other substances that may prevent your medicines from working properly [facility]).  Talk with your pharmacist or health care provider about all the medicines that you take, their possible side effects, and what medicines are safe to take together.  Make it your goal to take part in all treatment decisions (shared decision-making). Ask about possible side effects of medicines that your health care provider recommends, and tell him or her how you feel about having those side effects. It is best if shared decision-making with your  health care provider is part of your total treatment plan.  Relationships To strengthen your relationships with family members while treating your condition, consider taking part in family therapy. You might also attend self-help groups alone or with a loved one. Be honest about how your symptoms affect your relationships. Make an effort to communicate respectfully instead of fighting, and find ways to show others that you care. Psychotherapy may be useful in helping you cope with how ADHD affects your relationships. How to recognize changes in your condition The following signs may mean that your treatment is working well and your condition is improving:  Consistently being on time for appointments.  Being more organized at home and work.  Other people noticing improvements in your behavior.  Achieving goals that you set for yourself.  Thinking more clearly.  The following signs may mean that your treatment is not working very well:  Feeling impatience or more confusion.  Missing, forgetting, or being late for appointments.  An increasing sense of disorganization and messiness.  More difficulty in reaching goals that you set for yourself.  Loved ones becoming angry or frustrated with you.  Where to find support Talking to others  Keep emotion out of important discussions and speak in a calm, logical way.  Listen closely and patiently to your loved ones. Try to understand their point of view, and try to avoid getting defensive.  Take responsibility for the consequences of your actions.  Ask that others do not take your behaviors personally.  Aim to solve problems as they come up, and express your  feelings instead of bottling them up.  Talk openly about what you need from your loved ones and how they can support you.  Consider going to family therapy sessions or having your family meet with a specialist who deals with ADHD-related behavior problems. Finances Not all  insurance plans cover mental health care, so it is important to check with your insurance carrier. If paying for co-pays or counseling services is a problem, search for a local or county mental health care center. Public mental health care services may be offered there at a low cost or no cost when you are not able to see a private health care provider. If you are taking medicine for ADHD, you may be able to get the generic form, which may be less expensive than brand-name medicine. Some makers of prescription medicines also offer help to patients who cannot afford the medicines that they need. Follow these instructions at home:  Take over-the-counter and prescription medicines only as told by your health care provider. Check with your health care provider before taking any new medicines.  Create structure and an organized atmosphere at home. For example: ? Make a list of tasks, then rank them from most important to least important. Work on one task at a time until your listed tasks are done. ? Make a daily schedule and follow it consistently every day. ? Use an appointment calendar, and check it 2 or 3 times a day to keep on track. Keep it with you when you leave the house. ? Create spaces where you keep certain things, and always put things back in their places after you use them.  Keep all follow-up visits as told by your health care provider. This is important. Questions to ask your health care provider:  What are the risks and benefits of taking medicines?  Would I benefit from therapy?  How often should I follow up with a health care provider? Contact a health care provider if:  You have side effects from your medicines, such as: ? Repeated muscle twitches, coughing, or speech outbursts. ? Sleep problems. ? Loss of appetite. ? Depression. ? New or worsening behavior problems. ? Dizziness. ? Unusually fast heartbeat. ? Stomach pains. ? Headaches. Get help right away if:  You  have a severe reaction to a medicine.  Your behavior suddenly gets worse. Summary  With treatment and support, you can live with ADHD and manage your symptoms.  The medicines that are most often prescribed for ADHD are stimulants.  Consider taking part in family therapy or self-help groups with family members or friends.  When you talk with friends and family about your ADHD, be patient and communicate openly.  Take over-the-counter and prescription medicines only as told by your health care provider. Check with your health care provider before taking any new medicines. This information is not intended to replace advice given to you by your health care provider. Make sure you discuss any questions you have with your health care provider. Document Released: 04/23/2016 Document Revised: 04/23/2016 Document Reviewed: 04/23/2016 Elsevier Interactive Patient Education  [employer].

## 2017-07-30 ENCOUNTER — Telehealth: Payer: Self-pay | Admitting: Family Medicine

## 2017-07-30 NOTE — Telephone Encounter (Signed)
Patient's wife/ Darlene called states Pharmacy was suppose to call our office to let Dr. Raliegh Scarlet know  (Ins Co) will " Not" cover the Rx:  amphetamine-dextroamphetamine (ADDERALL XR) 20 MG 24 hr capsule [[id number]]   Order Details  Dose: 20 mg Route: Oral Frequency: BH-each morning  Dispense Quantity: 30 capsule Refills: 0 Fills remaining: --        Sig: Take 1 capsule (20 mg total) by mouth every morning.          Patient is wondering if there is one Ins will pay for .   [facility] - [place], [place]. [phone] (Phone) [phone] (Fax)  Was suppose to send notice of denial to PCP.  ----Forwarding message to medical assistant.  --Dion Body

## 2017-08-02 NOTE — Telephone Encounter (Signed)
Med PA approved called patient left message to call back. MPulliam, CMA/RT(R)

## 2017-08-03 ENCOUNTER — Telehealth: Payer: Self-pay | Admitting: Family Medicine

## 2017-08-03 LAB — LIPID PANEL W/O CHOL/HDL RATIO
Cholesterol, Total: 137 mg/dL (ref 100–199)
HDL: 41 mg/dL (ref >39)
LDL Calculated: 78 mg/dL (ref 0–99)
Triglycerides: 88 mg/dL (ref 0–149)
VLDL Cholesterol Cal: 18 mg/dL (ref 5–40)

## 2017-08-03 LAB — SPECIMEN STATUS REPORT

## 2017-08-03 NOTE — Telephone Encounter (Signed)
Pt's wife /Darlene called states pharmacy advised them that Adderall in the ( Non extended release tab) is less expensive...    ------Patient request to have a new script sent to [facility] for the less costly Rx.     Original Rx:   amphetamine-dextroamphetamine (ADDERALL XR) 20 MG 24 hr capsule [[id number]]   Order Details  Dose: 20 mg Route: Oral Frequency: BH-each morning  Dispense Quantity: 30 capsule Refills: 0 Fills remaining: --        Sig: Take 1 capsule (20 mg total) by mouth every morning.     Please send Amended Rx to:  [place], [place]. [phone] (Phone) [phone] (Fax)    Forwarding request to medical assistant----Pt call pt with decision.  --glh  --glh

## 2017-08-03 NOTE — Telephone Encounter (Signed)
I recommend that they check with their insurance and see what their cheapest option would be regarding stimulant medication for ADD, then let us know.     Also they can check on good Rx or Rx saver website to see how much generic amphetamine or Ritalin would cause them on that.

## 2017-08-03 NOTE — Telephone Encounter (Signed)
Sent to Dr. Opalski for review. MPulliam, CMA/RT(R)  

## 2017-08-03 NOTE — Telephone Encounter (Signed)
Spoke to patient's wife and she states that after PA approval the copay is still $45 for a month and would like to know if there is anything else the patient can try that may be less cost.  Please advise.

## 2017-08-03 NOTE — Telephone Encounter (Signed)
Patient's wife checked with the pharmacy and they informed her that the the non extended release  Adderrall would be the cheaper medication.  Please advise.  Thank you. MPulliam, CMA/RT(R)

## 2017-08-04 ENCOUNTER — Other Ambulatory Visit: Payer: Self-pay

## 2017-08-04 DIAGNOSIS — F900 Attention-deficit hyperactivity disorder, predominantly inattentive type: Secondary | ICD-10-CM

## 2017-08-04 MED ORDER — AMPHETAMINE-DEXTROAMPHETAMINE 10 MG PO TABS
10.0000 mg | ORAL_TABLET | Freq: Two times a day (BID) | ORAL | 0 refills | Status: DC
Start: 2017-08-04 — End: 2017-08-09

## 2017-08-04 NOTE — Telephone Encounter (Signed)
Extended release Adderall is too expensive. Per Dr. Raliegh Scarlet change to palin Adderall 10 mg bid.    Dr. Hershal Coria note : I rec  adderall 10mg   1 po bid * 90 d Disp 180 No RF    RX printed and Dr. Raliegh Scarlet sign.  Patient to be notified to pick up. MPulliam, CMA/RT(R)

## 2017-08-04 NOTE — Telephone Encounter (Signed)
I rec  adderall 10mg   1 po bid * 90 d Disp 180 No RF

## 2017-08-04 NOTE — Telephone Encounter (Signed)
RX printed and patient to be notified to pick up. MPulliam, CMA/RT(R)

## 2017-08-09 ENCOUNTER — Other Ambulatory Visit: Payer: Self-pay | Admitting: Family Medicine

## 2017-08-09 ENCOUNTER — Other Ambulatory Visit: Payer: Self-pay

## 2017-08-09 DIAGNOSIS — F900 Attention-deficit hyperactivity disorder, predominantly inattentive type: Secondary | ICD-10-CM

## 2017-08-09 MED ORDER — AMPHETAMINE-DEXTROAMPHETAMINE 10 MG PO TABS: 10.0000 mg | ORAL_TABLET | Freq: Two times a day (BID) | ORAL | 0 refills | Status: DC

## 2017-08-09 NOTE — Telephone Encounter (Signed)
Patient never picked up RX for short acting Adderall, can this please be sent through the system for the patient and Printed RX will be shredded.  Thanks. MPulliam, CMA/RT(R)

## 2017-08-09 NOTE — Telephone Encounter (Signed)
Pt's wife called back 8/5 states Ins Co advised her that there is a Generic form of this : amphetamine-dextroamphetamine (ADDERALL) 10 MG tablet [[id number]]   Order Details  Dose: 10 mg Route: Oral Frequency: 2 times daily  Dispense Quantity: 180 tablet Refills: 0 Fills remaining: --        Sig: Take 1 tablet (10 mg total) by mouth 2 (two) times daily.          ---Generic is : amphetamine-dextroamphetamine  Replacement for the Adderall & is less expensive & they are allowed 90dys supply instead of the 30day.  Please call " New Rx " to:  [place], [place]. [phone] (Phone) [phone] (Fa    --forwarding message to medical asst.  --glh

## 2017-08-22 DIAGNOSIS — G4733 Obstructive sleep apnea (adult) (pediatric): Secondary | ICD-10-CM | POA: Diagnosis not present

## 2017-08-30 ENCOUNTER — Ambulatory Visit (INDEPENDENT_AMBULATORY_CARE_PROVIDER_SITE_OTHER): Payer: PPO | Admitting: Family Medicine

## 2017-08-30 ENCOUNTER — Encounter: Payer: Self-pay | Admitting: Family Medicine

## 2017-08-30 VITALS — BP 108/71 | HR 70 | Ht 71.0 in | Wt 223.0 lb

## 2017-08-30 DIAGNOSIS — F900 Attention-deficit hyperactivity disorder, predominantly inattentive type: Secondary | ICD-10-CM

## 2017-08-30 NOTE — Patient Instructions (Addendum)
It appears we were able to give you 180 tablets back on 08/09/2017.  Thus, when you have a follow-up office visit for your 66-month diabetes check, we can give you another prescription of the ADD meds and call if needed prior.    Living With Attention Deficit Hyperactivity Disorder If you have been diagnosed with attention deficit hyperactivity disorder (ADHD), you may be relieved that you now know why you have felt or behaved a certain way. Still, you may feel overwhelmed about the treatment ahead. You may also wonder how to get the support you need and how to deal with the condition day-to-day. With treatment and support, you can live with ADHD and manage your symptoms. How to manage lifestyle changes Managing stress Stress is your body's reaction to life changes and events, both good and bad. To cope with the stress of an ADHD diagnosis, it may help to:  Learn more about ADHD.  Exercise regularly. Even a short daily walk can lower stress levels.  Participate in training or education programs (including social skills training classes) that teach you to deal with symptoms.  Medicines Your health care provider may suggest certain medicines if he or she feels that they will help to improve your condition. Stimulant medicines are usually prescribed to treat ADHD, and therapy may also be prescribed. It is important to:  Avoid using alcohol and other substances that may prevent your medicines from working properly [facility]).  Talk with your pharmacist or health care provider about all the medicines that you take, their possible side effects, and what medicines are safe to take together.  Make it your goal to take part in all treatment decisions (shared decision-making). Ask about possible side effects of medicines that your health care provider recommends, and tell him or her how you feel about having those side effects. It is best if shared decision-making with your health care provider is  part of your total treatment plan.  Relationships To strengthen your relationships with family members while treating your condition, consider taking part in family therapy. You might also attend self-help groups alone or with a loved one. Be honest about how your symptoms affect your relationships. Make an effort to communicate respectfully instead of fighting, and find ways to show others that you care. Psychotherapy may be useful in helping you cope with how ADHD affects your relationships. How to recognize changes in your condition The following signs may mean that your treatment is working well and your condition is improving:  Consistently being on time for appointments.  Being more organized at home and work.  Other people noticing improvements in your behavior.  Achieving goals that you set for yourself.  Thinking more clearly.  The following signs may mean that your treatment is not working very well:  Feeling impatience or more confusion.  Missing, forgetting, or being late for appointments.  An increasing sense of disorganization and messiness.  More difficulty in reaching goals that you set for yourself.  Loved ones becoming angry or frustrated with you.  Where to find support Talking to others  Keep emotion out of important discussions and speak in a calm, logical way.  Listen closely and patiently to your loved ones. Try to understand their point of view, and try to avoid getting defensive.  Take responsibility for the consequences of your actions.  Ask that others do not take your behaviors personally.  Aim to solve problems as they come up, and express your feelings instead of bottling them  up.  Talk openly about what you need from your loved ones and how they can support you.  Consider going to family therapy sessions or having your family meet with a specialist who deals with ADHD-related behavior problems. Finances Not all insurance plans cover mental  health care, so it is important to check with your insurance carrier. If paying for co-pays or counseling services is a problem, search for a local or county mental health care center. Public mental health care services may be offered there at a low cost or no cost when you are not able to see a private health care provider. If you are taking medicine for ADHD, you may be able to get the generic form, which may be less expensive than brand-name medicine. Some makers of prescription medicines also offer help to patients who cannot afford the medicines that they need. Follow these instructions at home:  Take over-the-counter and prescription medicines only as told by your health care provider. Check with your health care provider before taking any new medicines.  Create structure and an organized atmosphere at home. For example: ? Make a list of tasks, then rank them from most important to least important. Work on one task at a time until your listed tasks are done. ? Make a daily schedule and follow it consistently every day. ? Use an appointment calendar, and check it 2 or 3 times a day to keep on track. Keep it with you when you leave the house. ? Create spaces where you keep certain things, and always put things back in their places after you use them.  Keep all follow-up visits as told by your health care provider. This is important. Questions to ask your health care provider:  What are the risks and benefits of taking medicines?  Would I benefit from therapy?  How often should I follow up with a health care provider? Contact a health care provider if:  You have side effects from your medicines, such as: ? Repeated muscle twitches, coughing, or speech outbursts. ? Sleep problems. ? Loss of appetite. ? Depression. ? New or worsening behavior problems. ? Dizziness. ? Unusually fast heartbeat. ? Stomach pains. ? Headaches. Get help right away if:  You have a severe reaction to a  medicine.  Your behavior suddenly gets worse. Summary  With treatment and support, you can live with ADHD and manage your symptoms.  The medicines that are most often prescribed for ADHD are stimulants.  Consider taking part in family therapy or self-help groups with family members or friends.  When you talk with friends and family about your ADHD, be patient and communicate openly.  Take over-the-counter and prescription medicines only as told by your health care provider. Check with your health care provider before taking any new medicines. This information is not intended to replace advice given to you by your health care provider. Make sure you discuss any questions you have with your health care provider. Document Released: 04/23/2016 Document Revised: 04/23/2016 Document Reviewed: 04/23/2016 Elsevier Interactive Patient Education  2018 [employer] With Attention Deficit Hyperactivity Disorder Attention deficit hyperactivity disorder (ADHD) is a behavior problem that is present in a person due to the way that his or her brain functions (neurobehavioral disorder). It is a common cause of behavioral and learning (academic) problems among children. ADHD is a long-term (chronic) condition. If this disorder is not treated, it can have serious effects into adolescence and  adulthood. When a person has ADHD, his or her condition can affect others around him or her, such as friends and family members. Friends and family can help by offering support and understanding. What do I need to know about this condition? ADHD can affect daily functioning in ways that often cause problems for the person with ADHD and his or her friends and family members. A child with ADHD may:  Have a poor attention span. This means that he or she can only stay focused or interested in something for a short time.  Get distracted easily.  Have trouble listening to  instructions.  Daydream.  Make careless mistakes.  Be forgetful.  Talk too much, such as blurting out answers to questions.  Have trouble sitting still for long.  Fidget or get out of his or her seat during class.  An adult with ADHD may:  Get distracted easily.  Be disorganized at home and work.  Miss, forget, or be late for appointments.  Have trouble with details.  Have trouble completing tasks.  Be irritable and impatient.  Get bored easily during meetings.  Have great difficulty concentrating.  What do I need to know about the treatment options? Treatment for this condition usually involves:  Behavioral treatment. Working with a Transport planner, the person with ADHD may: ? Set rewards for desired behavior. ? Set small goals and clear expectations, and be held accountable for meeting them. ? Get help with planning and timing activities. ? Become more patient and more mindful of the condition.  Medicines, such as: ? Stimulant medicines that help a person to:  Control his or her behavior (decrease impulsivity).  Control his or her extra physical activity (decrease hyperactivity).  Increase his or her ability to pay attention. ? Antidepressants. ? Certain blood pressure medicines.  Structured classroom management for children at school, such as tutoring or extra support in classes.  Techniques for parents to use at home to help manage their child's symptoms and behavior. These include rewarding good behavior, providing consistent discipline, and setting limits.  How can I support my loved one? Talk about the condition  Pick a time to talk with your loved one when distractions and interruptions are unlikely.  Let your loved one know that he or she is capable of success. Focus on your loved one's strengths, and try to not let your loved one use ADHD as an excuse for undesirable behavior.  Let your loved one know that there are well-known, successful people who  also have ADHD. This may be encouraging to your loved one.  Give your loved one time to process his or her thoughts and to ask questions.  Children with ADHD may benefit from hearing more about how their treatment plan will help them. This may help them focus on goal behaviors. Find support and resources A health care provider may be able to recommend resources that are available online or over the phone. You could start with:  Attention Deficit Disorder Association (ADDA): [employer]com.cy  National Institute of Mental Health [facility]): [employer]agingAlternatives.com.cy.shtml  Training classes or conferences that help parents of children with ADHD to support their children and cope with the disorder.  Support groups for families who are affected by ADHD.  General support If you are a parent of a child with ADHD, you can take the following actions to support your child's education:  Talk to teachers about the ways that your child learns best.  Be your child's advocate and stay in touch with  his or her school about all problems related to ADHD.  At the end of the summer, make appointments to talk with teachers and other school staff before the new school year begins.  Listen to teachers carefully, and share your child's history with them.  Create a behavior plan that your child, your family, and the teachers can agree on. Write down goals to help your child succeed.  How should I care for myself? It is important to find ways to care for your body, mind, and well-being while supporting someone with ADHD.  Spend time with friends and family. Find someone you can talk to who will also help you work on using coping skills to manage stress.  Understand what your limits are. Say "no" to requests or events that lead to a schedule that is too busy.  Make time for activities that help you relax, and try to not feel guilty about taking  time for yourself.  Consider trying meditation and deep breathing exercises to lower your stress.  Get plenty of sleep.  Exercise, even if it is just taking a short walk a few times a week.  If you are a parent of a child with ADHD, arrange for child care so you can take breaks once in a while.  What are some signs that the condition is getting worse? Signs that your loved one's condition may be getting worse include:  Increased trouble completing tasks and paying attention.  Hyperactivity and impulsivity.  Problems with relationships.  Impatience, restlessness, and mood swings.  Worsening problems at school, if applicable.  Contact a health care provider if:  Your loved one's symptoms get worse.  Your loved one shows signs of depression, anxiety, or another mental health condition.  Your child has behavioral problems at school. Summary  Attention deficit hyperactivity disorder (ADHD) is a long-term (chronic) condition that can affect daily functioning in ways that often cause problems for the person with ADHD and his or her loved ones.  This disorder can be treated effectively with medicine, behavioral treatment, and techniques to manage symptoms and behaviors.  Many organizations and groups are available to help families to manage ADHD.  The support people in the life of someone with ADHD play an extremely important role in helping that person develop healthy behaviors to live a satisfying life.  It is important to find ways to care for your own body, mind, and well-being while supporting someone with ADHD. Make time for activities that help you relax. This information is not intended to replace advice given to you by your health care provider. Make sure you discuss any questions you have with your health care provider. Document Released: 05/05/2016 Document Revised: 05/05/2016 Document Reviewed: 05/05/2016 Elsevier Interactive Patient Education  2018 [employer].

## 2017-08-30 NOTE — Progress Notes (Signed)
ADHD/ ADD OV note   Impression and Recommendations:    1. Attention deficit hyperactivity disorder (ADHD), predominantly inattentive type     ADHD -No change to treatment plan; pt is tolerating medication well -Educated pt on requirements for controlled substances such as Adderall including drug screening, agreeing only to get prescriptions from this office -Pt signed drug contract during OV today   No orders of the defined types were placed in this encounter.    Return for Due to follow-up around 10/23/2017 for diabetes and other chronic conditions.   -Reminded patient the need for yearly complete physical exam office visits in addition to office visits for management of the chronic diseases  -Gross side effects, risk and benefits, and alternatives of medications discussed with patient.  Patient is aware that all medications have potential side effects and we are unable to predict every side effect or drug-drug interaction that may occur.  Expresses verbal understanding and consents to current therapy plan and treatment regimen.   Please see AVS handed out to patient at the end of our visit for further patient instructions/ counseling done pertaining to today's office visit.    Note: This document was prepared using Dragon voice recognition software and may include unintentional dictation errors.  This document serves as a record of services personally performed by Mellody Dance, MD. It was created on her behalf by Georga Bora, a trained medical scribe. The creation of this record is based on the scribe's personal observations and the provider's statements to them.   I have reviewed the above medical documentation for accuracy and completeness and I concur.  Mellody Dance 08/31/17 9:34 PM    ______________________________________________________________________    Subjective:  HPI: [name] y.o. male  presents for 3 month follow up for multiple  medical problems.  ADHD -States the first few days were very slow and sluggish but he has adjusted well to the medication -States he has noticed a lot of improvement with focusing on tasks at work -Has been able to read passages at work and answer the questions instead of being distracted repeatedly and restarting -Says his sleeping has improved and he's noticed a positive change in attitude and has been less ornery -Denies changes or loss of appetite   -Pt believes his dosage is working and does not want to increase amount; says the initial impact of the medicine is very strong and he doesn't want to increase that effect -Says he does sometimes notice his medication wearing off in the afternoons but the extended release cost was much higher  Lifestyle -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    Weight:  Wt Readings from Last 3 Encounters:  08/30/17 223 lb (101.2 kg)  07/26/17 224 lb (101.6 kg)  04/21/17 232 lb 12.8 oz (105.6 kg)   BMI Readings from Last 3 Encounters:  08/30/17 31.10 kg/m  07/26/17 31.24 kg/m  04/21/17 31.57 kg/m   BP Readings from Last 3 Encounters:  08/30/17 108/71  07/26/17 113/76  04/21/17 111/71     Review of Systems: General:   No F/C, wt loss Pulm:   No DIB, SOB, pleuritic chest pain Card:  No CP, palpitations Abd:  No n/v/d or pain Ext:  No inc edema from baseline  Objective: Physical Exam: BP 108/71   Pulse 70   Ht '5\' 11"'$  (1.803 m)   Wt 223 lb (101.2 kg)   SpO2 98%   BMI 31.10 kg/m  Body mass index is 31.1 kg/m. General: Well nourished, in no apparent distress. Eyes: PERRLA, EOMs, conjunctiva clr no swelling or erythema Neck: supple Resp: Respiratory effort- normal, ECTA B/L w/o W/R/R  Cardio: RRR w/o MRGs. Skin: Warm, dry without  rashes, lesions, ecchymosis.  Neuro: Alert, Oriented Psych: Normal affect, Insight and Judgment appropriate.    Current Medications:  Current Outpatient Medications on File Prior to Visit  Medication Sig Dispense Refill  . allopurinol (ZYLOPRIM) 300 MG tablet TAKE 1/2 TABLET BY MOUTH DAILY 45 tablet 3  . amLODipine (NORVASC) 10 MG tablet TAKE 1/2 TABLET BY MOUTH 3 TIMES PER WEEK 18 tablet 1  . amphetamine-dextroamphetamine (ADDERALL) 10 MG tablet Take 1 tablet (10 mg total) by mouth 2 (two) times daily. 180 tablet 0  . Ascorbic Acid (VITAMIN C) 1000 MG tablet Take 1,000 mg by mouth daily.    Marland Kitchen aspirin 81 MG tablet Take 81 mg by mouth daily.      Marland Kitchen atorvastatin (LIPITOR) 20 MG tablet     . benazepril (LOTENSIN) 20 MG tablet Take 1 tablet (20 mg total) by mouth daily. 90 tablet 1  . blood glucose meter kit and supplies Dispense based on patient and insurance preference. Use to check fasting blood glucose in the morning and to check glucose 2 hours after largest meal of the day. (FOR ICD-10 E10.9, E11.9). 1 each 0  . FLUoxetine (PROZAC) 40 MG capsule TAKE 1 CAPSULE BY MOUTH DAILY 90 capsule 1  . fluticasone (FLONASE) 50 MCG/ACT nasal spray Place 2 sprays into both nostrils daily. 16 g 6  . gabapentin (NEURONTIN) 800 MG tablet TAKE 1 TABLET BY MOUTH THREE TIMES DAILY 90 tablet 2  . glucose blood test strip Use as instructed 100 each 12  . LANCETS ULTRA FINE MISC Use to check fasting blood glucose in the morning and to check glucose 2 hours after largest meal of the day 100 each 12  . Multiple Vitamins-Iron (MULTIVITAMINS WITH IRON) TABS Take 1 tablet by mouth daily. 30 tablet 0  . MYRBETRIQ 25 MG TB24 tablet     . pantoprazole (PROTONIX) 40 MG tablet TAKE 1 TABLET BY MOUTH DAILY 90 tablet 1  . polyethylene glycol powder (GLYCOLAX/MIRALAX) powder USE AS DIRECTED 850 g 11  . pyridoxine (B-6) 100 MG tablet Take 100 mg by mouth daily.    . traZODone (DESYREL) 50 MG tablet TAKE 1/2 TO 1 TABLET BY  MOUTH AT BEDTIMEAS NEEDED FOR SLEEP 30 tablet 6  . Vitamin D, Ergocalciferol, (DRISDOL) 50000 units CAPS capsule Take 1 capsule (50,000 Units total) by mouth every 7 (seven) days. 12 capsule 10   No current facility-administered medications on file prior to visit.     Medical History:  Patient Active Problem List   Diagnosis Date Noted  . Hypertension associated with diabetes ([place]) 12/01/2011    Priority: High  . Diabetes mellitus with renal manifestation ([place]) 06/11/2006    Priority: High  . Hyperlipidemia associated with type 2 diabetes mellitus ([place]) 05/26/2006    Priority: High  . OBSTRUCTIVE SLEEP APNEA 05/26/2006    Priority: High  . Attention deficit hyperactivity disorder (ADHD), predominantly inattentive type 07/26/2017    Priority: Medium  . Prostatitis 04/20/2014    Priority: Medium  . Mood disorder ([place]) 03/29/2013    Priority: Medium  .  Renal insufficiency 12/01/2011    Priority: Medium  . 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 Vitamin D deficiency 11/30/2007    Priority: Low  . Chronic left hip pain 07/26/2017  . Facet arthropathy, lumbosacral 07/26/2017  . DDD (degenerative disc disease), lumbosacral 07/26/2017  . 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 Obesity, Class I, BMI 30-34.9 04/25/2017  . Noncompliance 03/24/2017  . Abdominal bloating 05/15/2015  . UTI (urinary tract infection) 04/20/2014  . Hyperglycemia 04/20/2014  . Fatigue 03/07/2014  . Family history of early CAD 03/07/2014  . Maxillary sinusitis, acute 12/06/2013  . Tinea versicolor 12/06/2013  . Physical exam 11/06/2013  . Other malaise and fatigue 03/29/2013  . Decreased libido 03/29/2013  . Hematuria 02/17/2013  . Right elbow pain 12/15/2012  . Pustular folliculitis 00/12/3933  . Left ankle pain 03/15/2012  . SPINAL STENOSIS, LUMBAR 08/02/2008  . GOUT 11/30/2007  . BPH (benign  prostatic hyperplasia) 11/30/2007  . ANEMIA NEC 06/11/2006  . Chronic back pain greater than 3 months duration 06/11/2006    Allergies:  Allergies  Allergen Reactions  . Prednisone Other (See Comments)    Caused blood sugar to increase, pt will not take     Family history-  Reviewed; changed as appropriate  Social history-  Reviewed; changed as appropriate

## 2017-08-31 ENCOUNTER — Encounter: Payer: Self-pay | Admitting: Family Medicine

## 2017-09-15 ENCOUNTER — Encounter: Payer: Self-pay | Admitting: Family Medicine

## 2017-09-21 DIAGNOSIS — Z5181 Encounter for therapeutic drug level monitoring: Secondary | ICD-10-CM | POA: Diagnosis not present

## 2017-09-21 DIAGNOSIS — M47816 Spondylosis without myelopathy or radiculopathy, lumbar region: Secondary | ICD-10-CM | POA: Diagnosis not present

## 2017-09-21 DIAGNOSIS — Z79899 Other long term (current) drug therapy: Secondary | ICD-10-CM | POA: Diagnosis not present

## 2017-09-22 ENCOUNTER — Other Ambulatory Visit: Payer: Self-pay | Admitting: Family Medicine

## 2017-09-22 DIAGNOSIS — E1122 Type 2 diabetes mellitus with diabetic chronic kidney disease: Secondary | ICD-10-CM | POA: Diagnosis not present

## 2017-09-22 DIAGNOSIS — N183 Chronic kidney disease, stage 3 (moderate): Secondary | ICD-10-CM | POA: Diagnosis not present

## 2017-09-22 DIAGNOSIS — G4733 Obstructive sleep apnea (adult) (pediatric): Secondary | ICD-10-CM | POA: Diagnosis not present

## 2017-09-22 DIAGNOSIS — I129 Hypertensive chronic kidney disease with stage 1 through stage 4 chronic kidney disease, or unspecified chronic kidney disease: Secondary | ICD-10-CM | POA: Diagnosis not present

## 2017-09-22 DIAGNOSIS — M109 Gout, unspecified: Secondary | ICD-10-CM | POA: Diagnosis not present

## 2017-09-22 LAB — PROTIME-INR: Protime: 29 — AB (ref 10.0–13.8)

## 2017-09-22 LAB — BASIC METABOLIC PANEL
BUN: 33 — AB (ref 4–21)
Creatinine: 1.8 — AB (ref 0.6–1.3)
Glucose: 124
Potassium: 5.1 (ref 3.4–5.3)
Sodium: 137 (ref 137–147)

## 2017-09-22 LAB — IRON,TIBC AND FERRITIN PANEL
%SAT: 30
Ferritin: 417
Iron: 74
TIBC: 245

## 2017-09-22 LAB — CBC AND DIFFERENTIAL: Hemoglobin: 12.8 — AB (ref 13.5–17.5)

## 2017-10-05 DIAGNOSIS — E119 Type 2 diabetes mellitus without complications: Secondary | ICD-10-CM | POA: Diagnosis not present

## 2017-10-05 DIAGNOSIS — H40033 Anatomical narrow angle, bilateral: Secondary | ICD-10-CM | POA: Diagnosis not present

## 2017-10-05 LAB — HM DIABETES EYE EXAM

## 2017-10-06 ENCOUNTER — Other Ambulatory Visit: Payer: Self-pay | Admitting: Family Medicine

## 2017-10-15 ENCOUNTER — Other Ambulatory Visit: Payer: Self-pay | Admitting: Family Medicine

## 2017-10-19 ENCOUNTER — Other Ambulatory Visit: Payer: Self-pay

## 2017-10-19 ENCOUNTER — Telehealth: Payer: Self-pay | Admitting: Family Medicine

## 2017-10-19 NOTE — Telephone Encounter (Signed)
Sent refill request to Dr. Opalski for review. MPulliam, CMA/RT(R)  

## 2017-10-19 NOTE — Telephone Encounter (Signed)
Patient's wife called requesting a refill on allopurinol.  Medication was last given by a pervious provider. LOV 08/30/2017.  Please review and advise. MPulliam, CMA/RT(R)

## 2017-10-19 NOTE — Telephone Encounter (Signed)
Pt's wife called states pharmacy has sent 3 refill request on :---- Advised our system show request went to a Dr. Birdie Riddle not to( our Office/ Dr. Raliegh Scarlet)  allopurinol (ZYLOPRIM) 300 MG tablet [[id number]]   Order Details  Dose, Route, Frequency: As Directed   Dispense Quantity: 45 tablet Refills: 3 Fills remaining: --        Sig: TAKE 1/2 TABLET BY MOUTH DAILY       Written Date: 10/19/16 Expiration Date: 10/19/17    Start Date: 10/19/16 End Date: --         Ordering Provider: Midge Minium, MD DEA #:  [id number] NPI:  [id number]   Authorizing Provider: Midge Minium, MD DEA #:  [id number] NPI:  [id number]   Ordering User:  Davis Gourd, CMA            Original Order:  allopurinol (ZYLOPRIM) 300 MG tablet [[id number]]    Pharmacy:  [facility], [place]. DEA #:  --      --Pt's wife confirmed Dr. Birdie Riddle was (Original prescriber) & that she would contact the pharmacy to make correction on which provider patient is now seeing.  ---Forwarding request to medical assistant to review with provider for refill.  --glh

## 2017-10-20 DIAGNOSIS — M47817 Spondylosis without myelopathy or radiculopathy, lumbosacral region: Secondary | ICD-10-CM | POA: Diagnosis not present

## 2017-10-20 MED ORDER — ALLOPURINOL 300 MG PO TABS: 150.0000 mg | ORAL_TABLET | Freq: Every day | ORAL | 1 refills | Status: DC

## 2017-10-22 DIAGNOSIS — G4733 Obstructive sleep apnea (adult) (pediatric): Secondary | ICD-10-CM | POA: Diagnosis not present

## 2017-11-01 DIAGNOSIS — H9202 Otalgia, left ear: Secondary | ICD-10-CM | POA: Diagnosis not present

## 2017-11-01 DIAGNOSIS — M26622 Arthralgia of left temporomandibular joint: Secondary | ICD-10-CM | POA: Diagnosis not present

## 2017-11-08 ENCOUNTER — Ambulatory Visit (INDEPENDENT_AMBULATORY_CARE_PROVIDER_SITE_OTHER): Payer: PPO | Admitting: Family Medicine

## 2017-11-08 ENCOUNTER — Encounter: Payer: Self-pay | Admitting: Family Medicine

## 2017-11-08 VITALS — BP 134/78 | HR 59 | Temp 98.2°F | Ht 71.0 in | Wt 215.4 lb

## 2017-11-08 DIAGNOSIS — E1169 Type 2 diabetes mellitus with other specified complication: Secondary | ICD-10-CM | POA: Diagnosis not present

## 2017-11-08 DIAGNOSIS — Z23 Encounter for immunization: Secondary | ICD-10-CM | POA: Diagnosis not present

## 2017-11-08 DIAGNOSIS — L309 Dermatitis, unspecified: Secondary | ICD-10-CM | POA: Diagnosis not present

## 2017-11-08 DIAGNOSIS — I10 Essential (primary) hypertension: Secondary | ICD-10-CM

## 2017-11-08 DIAGNOSIS — B354 Tinea corporis: Secondary | ICD-10-CM | POA: Insufficient documentation

## 2017-11-08 DIAGNOSIS — E785 Hyperlipidemia, unspecified: Secondary | ICD-10-CM | POA: Diagnosis not present

## 2017-11-08 DIAGNOSIS — I152 Hypertension secondary to endocrine disorders: Secondary | ICD-10-CM

## 2017-11-08 DIAGNOSIS — E1159 Type 2 diabetes mellitus with other circulatory complications: Secondary | ICD-10-CM

## 2017-11-08 LAB — POCT GLYCOSYLATED HEMOGLOBIN (HGB A1C): Hemoglobin A1C: 6.5 % — AB (ref 4.0–5.6)

## 2017-11-08 MED ORDER — CLOTRIMAZOLE-BETAMETHASONE 1-0.05 % EX CREA: 1.0000 "application " | TOPICAL_CREAM | Freq: Two times a day (BID) | CUTANEOUS | 1 refills | Status: DC

## 2017-11-08 NOTE — Progress Notes (Signed)
Impression and Recommendations:    1. Type 2 diabetes mellitus with other specified complication, without long-term current use of insulin ([place])   2. Hypertension associated with diabetes ([place])   3. Hyperlipidemia associated with type 2 diabetes mellitus ([place])   4. Tinea corporis   5. Dermatitis   6. Flu vaccine need     1. Diabetes Mellitus - 6.5 today.  Last A1c on 07/23/2017 = 6.3. - Remains stable at this time, under good control. - Continue treatment plan as prescribed.  See med list below. - Patient tolerating meds well without complication.  Denies S-E  - Counseled patient on pathophysiology of disease and discussed various treatment options, which often includes dietary and lifestyle modifications as first line.  Importance of low carb/ketogenic diet discussed with patient in addition to regular exercise.   - Check FBS and 2 hours after the biggest meal of your day.  Keep log and bring in next OV for my review.   Also, if you ever feel poorly, please check your blood pressure and blood sugar, as one or the other could be the cause of your symptoms.  - Being a diabetic, you need yearly eye and foot exams. Make appt.for diabetic eye exam.   2. Hypertension - Stable at this time. - Continue treatment plan as prescribed.  See med list below. - Patient tolerating meds well without complication.  Denies S-E.  - Lifestyle changes such as dash diet and engaging in a regular exercise program discussed with patient.  Educational handouts provided  - Ambulatory BP monitoring encouraged. Keep log and bring in next OV.  - At next visit, if patient has lost more weight, reviewed with patient in office today that we may change his medication to ARB instead of an ACE inhibitor.  Lengthy education provided to patient today.  - Last visit to Kidney Doctor 09/22/2017 - Serum Creatinine was 1.8. - Encouraged patient to follow up with specialist as recommended. - Continue to avoid advil,  aleve, ibuprofen, and other nephrotoxic substances.   3. Hyperlipidemia - Last Lipid Panel 07/23/2017. - LDL = 78 last check.  - Continued dietary changes such as low saturated & trans fat and low carb/ ketogenic diets discussed with patient.  Encouraged regular exercise and weight loss when appropriate.    4. Mood - Stable at this time. - Continue treatment plan as prescribed.  See med list below. - Patient tolerating meds well without complication.  Denies S-E   5. Sleep - Insomnia - Stable at this time.  - Continue trazodone as prescribed.  See med list below.   6. Sinus & Ear Concerns - Advised the patient to begin using AYR or Neilmed sinus rinses BID followed by flonase BID (one spray to each nostril). Advised that the patient may also incorporate allegra or claritin PRN.   - Advised patient to continue to follow up with ENT as recommended.   7. Dermatological Concerns - Tinea Corporis, Dermatitis - Referral placed to dermatology today. - Lotrisone cream provided today for itch relief. - Lamisil/Terbinafine OTC cream recommended today for areas of fungal concern. - Advised patient that he will need to use it repeatedly for 8 weeks to resolve sx.   8. Chronic Back Pain - Encouraged patient to continue to follow-up with Dr. Maryruth Eve and receive management as prescribed.   9. BMI Counseling - BMI of 63.0 Explained to patient what BMI refers to, and what it means medically.    Told patient  to think about it as a "medical risk stratification measurement" and how increasing BMI is associated with increasing risk/ or worsening state of various diseases such as hypertension, hyperlipidemia, diabetes, premature OA, depression etc.  American Heart Association guidelines for healthy diet, basically Mediterranean diet, and exercise guidelines of 30 minutes 5 days per week or more discussed in detail.  Health counseling performed.  All questions answered.   10. Lifestyle &  Preventative Health Maintenance - Advised patient to continue working toward exercising to improve overall mental, physical, and emotional health.    - Encouraged patient to engage in daily physical activity, especially a formal exercise routine.  Recommended that the patient eventually strive for at least 150 minutes of moderate cardiovascular activity per week according to guidelines established by the [facility].   - Healthy dietary habits encouraged, including low-carb, and high amounts of lean protein in diet.   - Patient should also consume adequate amounts of water.   Education and routine counseling performed. Handouts provided.   11. Follow-Up - Prescriptions refilled today. - Re-check fasting lab work as recommended. - Continue to return and follow up with specialists as established.  - Otherwise, continue to return for CPE and chronic follow-up as scheduled.   - Patient knows to call in sooner if desired to address acute concerns.    Orders Placed This Encounter  Procedures  . Flu Vaccine QUAD 6+ mos PF IM (Fluarix Quad PF)  . Ambulatory referral to Dermatology  . POCT glycosylated hemoglobin (Hb A1C)    Meds ordered this encounter  Medications  . clotrimazole-betamethasone (LOTRISONE) cream    Sig: Apply 1 application topically 2 (two) times daily.    Dispense:  80 g    Refill:  1    The patient was counseled, risk factors were discussed, anticipatory guidance given.  Gross side effects, risk and benefits, and alternatives of medications discussed with patient.  Patient is aware that all medications have potential side effects and we are unable to predict every side effect or drug-drug interaction that may occur.  Expresses verbal understanding and consents to current therapy plan and treatment regimen.   Return for (2) , DM, HTN, HLD follow up every 3-54mo   Please see AVS handed out to patient at the end of our visit for further patient instructions/ counseling  done pertaining to today's office visit.    Note:  This document was prepared using Dragon voice recognition software and may include unintentional dictation errors.  This document serves as a record of services personally performed by DMellody Dance DO. It was created on her behalf by KToni Amend a trained medical scribe. The creation of this record is based on the scribe's personal observations and the provider's statements to them.   I have reviewed the above medical documentation for accuracy and completeness and I concur.  DMellody Dance11/04/19 2:35 PM    Subjective:    Chief Complaint  Patient presents with  . Follow-up    WDerrill Bagnellis a 62y.o. [facility]    Ear Concerns; Left Ear Pain Patient and wife note that he is currently taking prednisone for TMJ.  Recently, "whole left side of his face started aching and feeling pain," including some tooth pain. States he didn't know what was going on, and went to consult with Dr. BRedmond Basemanof ENT for his ear concerns.  Currently feels fullness, "ringing and popping and  whistling."  Feels that the medicine (prednisone) has improved his symptoms.  The prednisone has also helped his teeth to stop aching.  Patient has not been using sinus rinses daily.  States he can feel sx beginning in his sinuses.  Chronic Back Pain Patient states that he recently had 8 steroid injections in his back. Patient is following up with Dr. Maryruth Eve and "will need 8 more injections soon."  Recent Weight Loss 8/26 weight = 223 lbs. Weight today 11/08/2017 = 215 lbs.  Patient feels he "may have lost a pound or two" since last visit.  He has been trying to lose weight; his wife has been losing weight through the weight loss clinic as well.  Mood Patient feels that his mood has been good overall. Feels that the 40 mg of Prozac is a good dose, and he remains stable  at home. Wife confirms that his mood has been stable at home.  Sleep Continues taking trazodone at night to sleep. States he is sleeping okay.  Kidney Health - Formerly Managed by Renal Per patient's wife, "I think the kidney doc cleared him" and diagnosed him as overdosing on ibuprofen.  Patient denies tongue swelling, lip swelling.  Denies globalized rash all over his body.  Wife says "he is definitely bothered with rashes."  Denies excessive dry cough all the time.  Dermatological Concerns Patient is experiencing itching in the back of his head and across his eyes, "mostly when I sweat." Feels that the itching worsens when he sweats.  States that he has been using cream on some spots.  DM HPI: -  He has been working on diet and exercise for diabetes. States that he feels that he doesn't eat poorly.  Wife confirms that they do not always eat very healthfully.  Pt is currently maintained on the following medications for diabetes:   see med list today Medication compliance - continues on treatment plan as prescribed.  Home glucose readings range: 106, 112, 107, 114.   Feels his highest measurement has been around 130. Feels that his sugars at home have been good overall.   Denies polyuria/polydipsia. Denies hypo/ hyperglycemia symptoms - He denies new onset of: chest pain, exercise intolerance, shortness of breath, dizziness, visual changes, headache, lower extremity swelling or claudication.   Last diabetic eye exam was  Lab Results  Component Value Date   HMDIABEYEEXA No Retinopathy 10/11/2015    Foot exam- UTD  Last A1C in the office was:  Lab Results  Component Value Date   HGBA1C 6.5 (A) 11/08/2017   HGBA1C 6.3 (H) 07/23/2017   HGBA1C 6.7 04/21/2017    Lab Results  Component Value Date   MICROALBUR 80 12/07/2016   LDLCALC 78 07/23/2017   CREATININE 1.8 (A) 09/22/2017    1. HTN HPI:  -  His blood pressure has been controlled at home, per patient  report.  Patient continues to follow up with the kidney specialist.  - Patient reports good compliance with blood pressure medications  - Denies medication S-E.   - Smoking Status noted   - He denies new onset of: chest pain, exercise intolerance, shortness of breath, dizziness, visual changes, headache, lower extremity swelling or claudication.   Last 3 blood pressure readings in our office are as follows: BP Readings from Last 3 Encounters:  11/08/17 134/78  08/30/17 108/71  07/26/17 113/76    Filed Weights   11/08/17 1047  Weight: 215 lb 6.4 oz (97.7 kg)    BMI Readings from  Last 3 Encounters:  11/08/17 30.04 kg/m  08/30/17 31.10 kg/m  07/26/17 31.24 kg/m     Problem  Tinea Corporis      Patient Care Team    Relationship Specialty Notifications Start End  Mellody Dance, DO PCP - General Family Medicine  10/28/16   Rutherford Guys, MD Consulting Physician Ophthalmology  11/23/14   Jovita Gamma, MD Consulting Physician Neurosurgery  11/23/14   Kathie Rhodes, MD Consulting Physician Urology  12/07/14   Milus Banister, MD Attending Physician Gastroenterology  12/07/14   Chesley Mires, MD Consulting Physician Pulmonary Disease  12/07/16   Garald Balding, MD Consulting Physician Orthopedic Surgery  12/07/16    Comment: back and hip  Dene Gentry, MD Consulting Physician Sports Medicine  12/07/16    Comment: back and hip pain  Corliss Parish, MD Consulting Physician Nephrology  11/08/17    Comment: Sees her every 6 months     Patient Active Problem List   Diagnosis Date Noted  . Hypertension associated with diabetes ([place]) 12/01/2011    Priority: High  . Diabetes mellitus with renal manifestation ([place]) 06/11/2006    Priority: High  . Hyperlipidemia associated with type 2 diabetes mellitus ([place]) 05/26/2006    Priority: High  . OBSTRUCTIVE SLEEP APNEA 05/26/2006    Priority: High  . Attention deficit hyperactivity disorder (ADHD),  predominantly inattentive type 07/26/2017    Priority: Medium  . Prostatitis 04/20/2014    Priority: Medium  . Mood disorder ([place]) 03/29/2013    Priority: Medium  . Renal insufficiency 12/01/2011    Priority: Medium  . 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 Vitamin D deficiency 11/30/2007    Priority: Low  . Tinea corporis 11/08/2017  . Chronic left hip pain 07/26/2017  . Facet arthropathy, lumbosacral 07/26/2017  . DDD (degenerative disc disease), lumbosacral 07/26/2017  . 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 Obesity, Class I, BMI 30-34.9 04/25/2017  . Noncompliance 03/24/2017  . Abdominal bloating 05/15/2015  . UTI (urinary tract infection) 04/20/2014  . Hyperglycemia 04/20/2014  . Fatigue 03/07/2014  . Family history of early CAD 03/07/2014  . Maxillary sinusitis, acute 12/06/2013  . Tinea versicolor 12/06/2013  . Physical exam 11/06/2013  . Other malaise and fatigue 03/29/2013  . Decreased libido 03/29/2013  . Hematuria 02/17/2013  . Right elbow pain 12/15/2012  . Pustular folliculitis 21/30/8657  . Left ankle pain 03/15/2012  . SPINAL STENOSIS, LUMBAR 08/02/2008  . GOUT 11/30/2007  . BPH (benign prostatic hyperplasia) 11/30/2007  . ANEMIA NEC 06/11/2006  . Chronic back pain greater than 3 months duration 06/11/2006     Past Medical History:  Diagnosis Date  . Carpal tunnel syndrome, bilateral   . Carpal tunnel syndrome, right    nerve impingement right arm/wears brace  . Diabetes mellitus    no meds  . GERD (gastroesophageal reflux disease)   . Gout   . Hepatic steatosis   . Hyperlipidemia   . Hypertension   . Obstructive sleep apnea      Past Surgical History:  Procedure Laterality Date  . CARPAL TUNNEL RELEASE  09/10/2015   RIGHT WRIST / WITH NERVE IMPINGEMENT SURGERY  . CERVICAL FUSION     Dr Sherwood Gambler  . COLONOSCOPY  2007   negative;  [facility]  . ESOPHAGEAL DILATION  2007  . 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  LUMBAR FUSION      Dr Sherwood Gambler     Family History  Problem Relation Age of Onset  . Heart disease Father        pacer  . Urolithiasis Father   . Heart attack Father   . Aneurysm Mother 9       CNS aneurysm  . Sudden death Mother   . Diabetes Sister   . Cancer Maternal Uncle        ? primary  . Cancer Paternal Uncle        ? primary  . Kidney disease Brother   . Heart disease Sister   . Heart disease Sister   . Hyperlipidemia Neg Hx   . Hypertension Neg Hx   . Colon cancer Neg Hx      Social History   Substance and Sexual Activity  Drug Use No  ,  Social History   Substance and Sexual Activity  Alcohol Use No  ,  Social History   Tobacco Use  Smoking Status Former Smoker  . Last attempt to quit: 01/06/1976  . Years since quitting: 41.8  Smokeless Tobacco Never Used  Tobacco Comment   smoked 35 years ago as of 2013   ,    Current Outpatient Medications on File Prior to Visit  Medication Sig Dispense Refill  . allopurinol (ZYLOPRIM) 300 MG tablet Take 0.5 tablets (150 mg total) by mouth daily. 45 tablet 1  . amphetamine-dextroamphetamine (ADDERALL) 10 MG tablet Take 1 tablet (10 mg total) by mouth 2 (two) times daily. 180 tablet 0  . Ascorbic Acid (VITAMIN C) 1000 MG tablet Take 1,000 mg by mouth daily.    Marland Kitchen aspirin 81 MG tablet Take 81 mg by mouth daily.      Marland Kitchen atorvastatin (LIPITOR) 40 MG tablet TAKE 1 TABLET BY MOUTH EACH NIGHT AT BEDTIME 90 tablet 1  . benazepril (LOTENSIN) 20 MG tablet Take 1 tablet (20 mg total) by mouth daily. 90 tablet 1  . blood glucose meter kit and supplies Dispense based on patient and insurance preference. Use to check fasting blood glucose in the morning and to check glucose 2 hours after largest meal of the day. (FOR ICD-10 E10.9, E11.9). 1 each 0  . FLUoxetine (PROZAC) 40 MG capsule TAKE 1 CAPSULE BY MOUTH DAILY  90 capsule 1  . fluticasone (FLONASE) 50 MCG/ACT nasal spray Place 2 sprays into both nostrils daily. 16 g 6  . gabapentin (NEURONTIN) 800 MG tablet TAKE 1 TABLET BY MOUTH THREE TIMES DAILY 90 tablet 2  . glucose blood test strip Use as instructed 100 each 12  . LANCETS ULTRA FINE MISC Use to check fasting blood glucose in the morning and to check glucose 2 hours after largest meal of the day 100 each 12  . Multiple Vitamins-Iron (MULTIVITAMINS WITH IRON) TABS Take 1 tablet by mouth daily. 30 tablet 0  . MYRBETRIQ 25 MG TB24 tablet     . pantoprazole (PROTONIX) 40 MG tablet TAKE 1 TABLET BY MOUTH DAILY 90 tablet 1  . polyethylene glycol powder (GLYCOLAX/MIRALAX) powder USE AS DIRECTED 850 g 11  . pyridoxine (B-6) 100 MG tablet Take 100 mg by mouth daily.    . traZODone (DESYREL) 50 MG tablet TAKE 1/2 TO 1 TABLET BY MOUTH AT BEDTIMEAS NEEDED FOR SLEEP 30 tablet 6  . Vitamin D, Ergocalciferol, (DRISDOL) 50000 units CAPS capsule Take 1 capsule (50,000 Units total) by mouth every 7 (seven) days. 12 capsule 10   No current  facility-administered medications on file prior to visit.      Allergies  Allergen Reactions  . Prednisone Other (See Comments)    Caused blood sugar to increase, pt will not take     Review of Systems:   General:  Denies fever, chills Optho/Auditory:   Denies visual changes, blurred vision Respiratory:   Denies SOB, cough, wheeze, DIB  Cardiovascular:   Denies chest pain, palpitations, painful respirations Gastrointestinal:   Denies nausea, vomiting, diarrhea.  Endocrine:     Denies new hot or cold intolerance Musculoskeletal:  Denies joint swelling, gait issues, or new unexplained myalgias/ arthralgias Skin:  Denies rash, suspicious lesions  Neurological:    Denies dizziness, unexplained weakness, numbness  Psychiatric/Behavioral:   Denies mood changes    Objective:     Blood pressure 134/78, pulse (!) 59, temperature 98.2 F (36.8 C), height '5\' 11"'  (1.803 m),  weight 215 lb 6.4 oz (97.7 kg), SpO2 99 %.  Body mass index is 30.04 kg/m.  General: Well Developed, well nourished, and in no acute distress.  HEENT: Normocephalic, atraumatic, pupils equal round reactive to light, neck supple, No carotid bruits, no JVD Skin: Raised erythematous papule in a circumferential distribution in his right truncal region, as well as posterior scalp.  Warm and dry, cap RF less 2 sec Cardiac: Regular rate and rhythm, S1, S2 WNL's, no murmurs rubs or gallops Respiratory: ECTA B/L, Not using accessory muscles, speaking in full sentences. NeuroM-Sk: Ambulates w/o assistance, moves ext * 4 w/o difficulty, sensation grossly intact.  Ext: scant edema b/l lower ext Psych: No HI/SI, judgement and insight good, Euthymic mood. Full Affect.

## 2017-11-08 NOTE — Patient Instructions (Addendum)
Lamisil (Terbinafine) cream 2x daily to ringworm appearing lesions. Do not sit around in a wet or sweaty T-shirt. Do not scratch itchy areas of the skin.  Please be checking her blood pressure at home and keep a log-write it down and bring in next office visit in 3 to 4 months.  If blood pressure continues to be well controlled and you lose weight, we can take you off the benazepril and put you on an ARB-different blood pressure medicine.   -Your kidney doctor said to follow-up every 6 months this is Dr. Vanetta Mulders.  Last seen mid to late September  -Kidney continue to avoid Advil-ibuprofen, Aleve and other medicines that are nephrotoxic\can be dangerous to your kidneys     Diabetes Mellitus and Standards of Medical Care  Managing diabetes (diabetes mellitus) can be complicated. Your diabetes treatment may be managed by a team of health care providers, including:  A diet and nutrition specialist (registered dietitian).  A nurse.  A certified diabetes educator (CDE).  A diabetes specialist (endocrinologist).  An eye doctor.  A primary care provider.  A dentist.  Your health care providers follow a schedule in order to help you get the best quality of care. The following schedule is a general guideline for your diabetes management plan. Your health care providers may also give you more specific instructions.  HbA1c (hemoglobin A1c) test This test provides information about blood sugar (glucose) control over the previous 2-3 months. It is used to check whether your diabetes management plan needs to be adjusted.  If you are meeting your treatment goals, this test is done at least 2 times a year.  If you are not meeting treatment goals or if your treatment goals have changed, this test is done 4 times a year.  Blood pressure test  This test is done at every routine medical visit. For most people, the goal is less than 130/80. Ask your health care provider what your goal  blood pressure should be.  Dental and eye exams  Visit your dentist two times a year.  If you have type 1 diabetes, get an eye exam 3-5 years after you are diagnosed, and then once a year after your first exam. ? If you were diagnosed with type 1 diabetes as a child, get an eye exam when you are age 78 or older and have had diabetes for 3-5 years. After the first exam, you should get an eye exam once a year.  If you have type 2 diabetes, have an eye exam as soon as you are diagnosed, and then once a year after your first exam.  Foot care exam  Visual foot exams are done at every routine medical visit. The exams check for cuts, bruises, redness, blisters, sores, or other problems with the feet.  A complete foot exam is done by your health care provider once a year. This exam includes an inspection of the structure and skin of your feet, and a check of the pulses and sensation in your feet. ? Type 1 diabetes: Get your first exam 3-5 years after diagnosis. ? Type 2 diabetes: Get your first exam as soon as you are diagnosed.  Check your feet every day for cuts, bruises, redness, blisters, or sores. If you have any of these or other problems that are not healing, contact your health care provider.  Kidney function test (urine microalbumin)  This test is done once a year. ? Type 1 diabetes: Get your first test 5 years  after diagnosis. ? Type 2 diabetes: Get your first test as soon as you are diagnosed._  If you have chronic kidney disease (CKD), get a serum creatinine and estimated glomerular filtration rate (eGFR) test once a year.  Lipid profile (cholesterol, HDL, LDL, triglycerides)  This test should be done when you are diagnosed with diabetes, and every 5 years after the first test. If you are on medicines to lower your cholesterol, you may need to get this test done every year. ? The goal for LDL is less than 100 mg/dL (5.5 mmol/L). If you are at high risk, the goal is less than 70  mg/dL (3.9 mmol/L). ? The goal for HDL is 40 mg/dL (2.2 mmol/L) for men and 50 mg/dL(2.8 mmol/L) for women. An HDL cholesterol of 60 mg/dL (3.3 mmol/L) or higher gives some protection against heart disease. ? The goal for triglycerides is less than 150 mg/dL (8.3 mmol/L).  Immunizations  The yearly flu (influenza) vaccine is recommended for everyone 6 months or older who has diabetes.  The pneumonia (pneumococcal) vaccine is recommended for everyone 2 years or older who has diabetes. If you are 47 or older, you may get the pneumonia vaccine as a series of two separate shots.  The hepatitis B vaccine is recommended for adults shortly after they have been diagnosed with diabetes.  The Tdap (tetanus, diphtheria, and pertussis) vaccine should be given: ? According to normal childhood vaccination schedules, for children. ? Every 10 years, for adults who have diabetes.  The shingles vaccine is recommended for people who have had chicken pox and are 50 years or older.  Mental and emotional health  Screening for symptoms of eating disorders, anxiety, and depression is recommended at the time of diagnosis and afterward as needed. If your screening shows that you have symptoms (you have a positive screening result), you may need further evaluation and be referred to a mental health care provider.  Diabetes self-management education  Education about how to manage your diabetes is recommended at diagnosis and ongoing as needed.  Treatment plan  Your treatment plan will be reviewed at every medical visit.  Summary  Managing diabetes (diabetes mellitus) can be complicated. Your diabetes treatment may be managed by a team of health care providers.  Your health care providers follow a schedule in order to help you get the best quality of care.  Standards of care including having regular physical exams, blood tests, blood pressure monitoring, immunizations, screening tests, and education about how  to manage your diabetes.  Your health care providers may also give you more specific instructions based on your individual health.      Type 2 Diabetes Mellitus, Self Care, Adult Caring for yourself after you have been diagnosed with type 2 diabetes (type 2 diabetes mellitus) means keeping your blood sugar (glucose) under control with a balance of:  Nutrition.  Exercise.  Lifestyle changes.  Medicines or insulin, if necessary.  Support from your team of health care providers and others.  The following information explains what you need to know to manage your diabetes at home. What do I need to do to manage my blood glucose?  Check your blood glucose every day, as often as told by your health care provider.  Contact your health care provider if your blood glucose is above your target for 2 tests in a row.  Have your A1c (hemoglobin A1c) level checked at least two times a year, or as often as told by your health care  provider. Your health care provider will set individualized treatment goals for you. Generally, the goal of treatment is to maintain the following blood glucose levels:  Before meals (preprandial): 80-130 mg/dL (4.4-7.2 mmol/L).  After meals (postprandial): below 180 mg/dL (10 mmol/L).  A1c level: less than 7%.  What do I need to know about hyperglycemia and hypoglycemia? What is hyperglycemia? Hyperglycemia, also called high blood glucose, occurs when blood glucose is too high.Make sure you know the early signs of hyperglycemia, such as:  Increased thirst.  Hunger.  Feeling very tired.  Needing to urinate more often than usual.  Blurry vision.  What is hypoglycemia? Hypoglycemia, also called low blood glucose, occurswith a blood glucose level at or below 70 mg/dL (3.9 mmol/L). The risk for hypoglycemia increases during or after exercise, during sleep, during illness, and when skipping meals or not eating for a long time (fasting). It is important to  know the symptoms of hypoglycemia and treat it right away. Always have a 15-gram rapid-acting carbohydrate snack with you to treat low blood glucose. Family members and close friends should also know the symptoms and should understand how to treat hypoglycemia, in case you are not able to treat yourself. What are the symptoms of hypoglycemia? Hypoglycemia symptoms can include:  Hunger.  Anxiety.  Sweating and feeling clammy.  Confusion.  Dizziness or feeling light-headed.  Sleepiness.  Nausea.  Increased heart rate.  Headache.  Blurry vision.  Seizure.  Nightmares.  Tingling or numbness around the mouth, lips, or tongue.  A change in speech.  Decreased ability to concentrate.  A change in coordination.  Restless sleep.  Tremors or shakes.  Fainting.  Irritability.  How do I treat hypoglycemia?  If you are alert and able to swallow safely, follow the 15:15 rule:  Take 15 grams of a rapid-acting carbohydrate. Rapid-acting options include: ? 1 tube of glucose gel. ? 3 glucose pills. ? 6-8 pieces of hard candy. ? 4 oz (120 mL) of fruit juice. ? 4 oz (120 mL) of regular (not diet) soda.  Check your blood glucose 15 minutes after you take the carbohydrate.  If the repeat blood glucose level is still at or below 70 mg/dL (3.9 mmol/L), take 15 grams of a carbohydrate again.  If your blood glucose level does not increase above 70 mg/dL (3.9 mmol/L) after 3 tries, seek emergency medical care.  After your blood glucose level returns to normal, eat a meal or a snack within 1 hour.  How do I treat severe hypoglycemia? Severe hypoglycemia is when your blood glucose level is at or below 54 mg/dL (3 mmol/L). Severe hypoglycemia is an emergency. Do not wait to see if the symptoms will go away. Get medical help right away. Call your local emergency services (911 in the [place] Do not drive yourself to the hospital. If you have severe hypoglycemia and you cannot eat or  drink, you may need an injection of glucagon. A family member or close friend should learn how to check your blood glucose and how to give you a glucagon injection. Ask your health care provider if you need to have an emergency glucagon injection kit available. Severe hypoglycemia may need to be treated in a hospital. The treatment may include getting glucose through an IV tube. You may also need treatment for the cause of your hypoglycemia. Can having diabetes put me at risk for other conditions? Having diabetes can put you at risk for other long-term (chronic) conditions, such as heart disease and  kidney disease. Your health care provider may prescribe medicines to help prevent complications from diabetes. These medicines may include:  Aspirin.  Medicine to lower cholesterol.  Medicine to control blood pressure.  What else can I do to manage my diabetes? Take your diabetes medicines as told  If your health care provider prescribed insulin or diabetes medicines, take them every day.  Do not run out of insulin or other diabetes medicines that you take. Plan ahead so you always have these available.  If you use insulin, adjust your dosage based on how physically active you are and what foods you eat. Your health care provider will tell you how to adjust your dosage. Make healthy food choices  The things that you eat and drink affect your blood glucose and your insulin dosage. Making good choices helps to control your diabetes and prevent other health problems. A healthy meal plan includes eating lean proteins, complex carbohydrates, fresh fruits and vegetables, low-fat dairy products, and healthy fats. Make an appointment to see a diet and nutrition specialist (registered dietitian) to help you create an eating plan that is right for you. Make sure that you:  Follow instructions from your health care provider about eating or drinking restrictions.  Drink enough fluid to keep your urine clear  or pale yellow.  Eat healthy snacks between nutritious meals.  Track the carbohydrates that you eat. Do this by reading food labels and learning the standard serving sizes of foods.  Follow your sick day plan whenever you cannot eat or drink as usual. Make this plan in advance with your health care provider.  Stay active  Exercise regularly, as told by your health care provider. This may include:  Stretching and doing strength exercises, such as yoga or weightlifting, at least 2 times a week.  Doing at least 150 minutes of moderate-intensity or vigorous-intensity exercise each week. This could be brisk walking, biking, or water aerobics. ? Spread out your activity over at least 3 days of the week. ? Do not go more than 2 days in a row without doing some kind of physical activity.  When you start a new exercise or activity, work with your health care provider to adjust your insulin, medicines, or food intake as needed. Make healthy lifestyle choices  Do not use any tobacco products, such as cigarettes, chewing tobacco, and e-cigarettes. If you need help quitting, ask your health care provider.  If your health care provider says that alcohol is safe for you, limit alcohol intake to no more than 1 drink per day for nonpregnant women and 2 drinks per day for men. One drink equals 12 oz of beer, 5 oz of wine, or 1 oz of hard liquor.  Learn to manage stress. If you need help with this, ask your health care provider. Care for your body   Keep your immunizations up to date. In addition to getting vaccinations as told by your health care provider, it is recommended that you get vaccinated against the following illnesses: ? The flu (influenza). Get a flu shot every year. ? Pneumonia. ? Hepatitis B.  Schedule an eye exam soon after your diagnosis, and then one time every year after that.  Check your skin and feet every day for cuts, bruises, redness, blisters, or sores. Schedule a foot exam  with your health care provider once every year.  Brush your teeth and gums two times a day, and floss at least one time a day. Visit your dentist at least  once every 6 months.  Maintain a healthy weight. General instructions  Take over-the-counter and prescription medicines only as told by your health care provider.  Share your diabetes management plan with people in your workplace, school, and household.  Check your urine for ketones when you are ill and as told by your health care provider.  Ask your health care provider: ? Do I need to meet with a diabetes educator? ? Where can I find a support group for people with diabetes?  Carry a medical alert card or wear medical alert jewelry.  Keep all follow-up visits as told by your health care provider. This is important. Where to find more information: For more information about diabetes, visit:  American Diabetes Association (ADA): www.diabetes.org  American Association of Diabetes Educators (AADE): www.diabeteseducator.org/patient-resources  This information is not intended to replace advice given to you by your health care provider. Make sure you discuss any questions you have with your health care provider. Document Released: 04/15/2015 Document Revised: 05/30/2015 Document Reviewed: 01/25/2015 Elsevier Interactive Patient Education  2017 [place].      Blood Glucose Monitoring, Adult Monitoring your blood sugar (glucose) helps you manage your diabetes. It also helps you and your health care provider determine how well your diabetes management plan is working. Blood glucose monitoring involves checking your blood glucose as often as directed, and keeping a record (log) of your results over time. Why should I monitor my blood glucose? Checking your blood glucose regularly can:  Help you understand how food, exercise, illnesses, and medicines affect your blood glucose.  Let you know what your blood glucose is at any  time. You can quickly tell if you are having low blood glucose (hypoglycemia) or high blood glucose (hyperglycemia).  Help you and your health care provider adjust your medicines as needed.  When should I check my blood glucose? Follow instructions from your health care provider about how often to check your blood glucose.   This may depend on:  The type of diabetes you have.  How well-controlled your diabetes is.  Medicines you are taking.  If you have type 1 diabetes:  Check your blood glucose at least 2 times a day.  Also check your blood glucose: ? Before every insulin injection. ? Before and after exercise. ? Between meals. ? 2 hours after a meal. ? Occasionally between 2:00 a.m. and 3:00 a.m., as directed. ? Before potentially dangerous tasks, like driving or using heavy machinery. ? At bedtime.  You may need to check your blood glucose more often, up to 6-10 times a day: ? If you use an insulin pump. ? If you need multiple daily injections (MDI). ? If your diabetes is not well-controlled. ? If you are ill. ? If you have a history of severe hypoglycemia. ? If you have a history of not knowing when your blood glucose is getting low (hypoglycemia unawareness).  If you have type 2 diabetes:  If you take insulin or other diabetes medicines, check your blood glucose at least 2 times a day.  If you are on intensive insulin therapy, check your blood glucose at least 4 times a day. Occasionally, you may also need to check between 2:00 a.m. and 3:00 a.m., as directed.  Also check your blood glucose: ? Before and after exercise. ? Before potentially dangerous tasks, like driving or using heavy machinery.  You may need to check your blood glucose more often if: ? Your medicine is being adjusted. ? Your diabetes is  not well-controlled. ? You are ill.  What is a blood glucose log?  A blood glucose log is a record of your blood glucose readings. It helps you and your health  care provider: ? Look for patterns in your blood glucose over time. ? Adjust your diabetes management plan as needed.  Every time you check your blood glucose, write down your result and notes about things that may be affecting your blood glucose, such as your diet and exercise for the day.  Most glucose meters store a record of glucose readings in the meter. Some meters allow you to download your records to a computer. How do I check my blood glucose? Follow these steps to get accurate readings of your blood glucose: Supplies needed   Blood glucose meter.  Test strips for your meter. Each meter has its own strips. You must use the strips that come with your meter.  A needle to prick your finger (lancet). Do not use lancets more than once.  A device that holds the lancet (lancing device).  A journal or log book to write down your results.  Procedure  Wash your hands with soap and water.  Prick the side of your finger (not the tip) with the lancet. Use a different finger each time.  Gently rub the finger until a small drop of blood appears.  Follow instructions that come with your meter for inserting the test strip, applying blood to the strip, and using your blood glucose meter.  Write down your result and any notes.  Alternative testing sites  Some meters allow you to use areas of your body other than your finger (alternative sites) to test your blood.  If you think you may have hypoglycemia, or if you have hypoglycemia unawareness, do not use alternative sites. Use your finger instead.  Alternative sites may not be as accurate as the fingers, because blood flow is slower in these areas. This means that the result you get may be delayed, and it may be different from the result that you would get from your finger.  The most common alternative sites are: ? Forearm. ? Thigh. ? Palm of the hand.  Additional tips  Always keep your supplies with you.  If you have  questions or need help, all blood glucose meters have a 24-hour hotline number that you can call. You may also contact your health care provider.  After you use a few boxes of test strips, adjust (calibrate) your blood glucose meter by following instructions that came with your meter.    The American Diabetes Association suggests the following targets for most nonpregnant adults with diabetes.  More or less stringent glycemic goals may be appropriate for each individual.  A1C: Less than 7% A1C may also be reported as eAG: Less than 154 mg/dl Before a meal (preprandial plasma glucose): 80-130 mg/dl 1-2 hours after beginning of the meal (Postprandial plasma glucose)*: Less than 180 mg/dl  *Postprandial glucose may be targeted if A1C goals are not met despite reaching preprandial glucose goals.   GOALS in short:  The goals are for the Hgb A1C to be less than 7.0 & blood pressure to be less than 130/80.    It is recommended that all diabetics are educated on and follow a healthy diabetic diet, exercise for 30 minutes 3-4 times per week (walking, biking, swimming, or machine), monitor blood glucose readings and bring that record with you to be reviewed at your next office visit.  You should be checking fasting blood sugars- especially after you eat poorly or eat really healthy, and also check 2 hour postprandial blood sugars after largest meal of the day.    Write these down and bring in your log at each office visit.    You will need to be seen every 3 months by the provider managing your Diabetes unless told otherwise by that provider.   You will need yearly eye exams from an eye specialist and foot exams to check the nerves of your feet.  Also, your urine should be checked yearly as well to make sure excess protein is not present.   If you are checking your blood pressure at home, please record it and bring it to your next office visit.    Follow the Dietary Approaches to Stop  Hypertension (DASH) diet (3 servings of fruit and vegetables daily, whole grains, low sodium, low-fat proteins).  See below.    Lastly, when it comes to your cholesterol, the goal is to have the HDL (good cholesterol) >40, and the LDL (bad cholesterol) <100.   It is recommended that you follow a heart healthy, low saturated and trans-fat diet and exercise for 30 minutes at least 5 times a week.     (( Check out the DASH diet = 1.5 Gram Low Sodium Diet   A 1.5 gram sodium diet restricts the amount of sodium in the diet to no more than 1.5 g or 1500 mg daily.  The American Heart Association recommends Americans over the age of 34 to consume no more than 1500 mg of sodium each day to reduce the risk of developing high blood pressure.  Research also shows that limiting sodium may reduce heart attack and stroke risk.  Many foods contain sodium for flavor and sometimes as a preservative.  When the amount of sodium in a diet needs to be low, it is important to know what to look for when choosing foods and drinks.  The following includes some information and guidelines to help make it easier for you to adapt to a low sodium diet.    QUICK TIPS  Do not add salt to food.  Avoid convenience items and fast food.  Choose unsalted snack foods.  Buy lower sodium products, often labeled as "lower sodium" or "no salt added."  Check food labels to learn how much sodium is in 1 serving.  When eating at a restaurant, ask that your food be prepared with less salt or none, if possible.    READING FOOD LABELS FOR SODIUM INFORMATION  The nutrition facts label is a good place to find how much sodium is in foods. Look for products with no more than 400 mg of sodium per serving.  Remember that 1.5 g = 1500 mg.  The food label may also list foods as:  Sodium-free: Less than 5 mg in a serving.  Very low sodium: 35 mg or less in a serving.  Low-sodium: 140 mg or less in a serving.  Light in sodium: 50% less sodium in  a serving. For example, if a food that usually has 300 mg of sodium is changed to become light in sodium, it will have 150 mg of sodium.  Reduced sodium: 25% less sodium in a serving. For example, if a food that usually has 400 mg of sodium is changed to reduced sodium, it will have 300 mg of sodium.    CHOOSING FOODS  Grains  Avoid: Salted crackers and snack items.  Some cereals, including instant hot cereals. Bread stuffing and biscuit mixes. Seasoned rice or pasta mixes.  Choose: Unsalted snack items. Low-sodium cereals, oats, puffed wheat and rice, shredded wheat. English muffins and bread. Pasta.  Meats  Avoid: Salted, canned, smoked, spiced, pickled meats, including fish and poultry. Bacon, ham, sausage, cold cuts, hot dogs, anchovies.  Choose: Low-sodium canned tuna and salmon. Fresh or frozen meat, poultry, and fish.  Dairy  Avoid: Processed cheese and spreads. Cottage cheese. Buttermilk and condensed milk. Regular cheese.  Choose: Milk. Low-sodium cottage cheese. Yogurt. Sour cream. Low-sodium cheese.  Fruits and Vegetables  Avoid: Regular canned vegetables. Regular canned tomato sauce and paste. Frozen vegetables in sauces. Olives. Angie Fava. Relishes. Sauerkraut.  Choose: Low-sodium canned vegetables. Low-sodium tomato sauce and paste. Frozen or fresh vegetables. Fresh and frozen fruit.  Condiments  Avoid: Canned and packaged gravies. Worcestershire sauce. Tartar sauce. Barbecue sauce. Soy sauce. Steak sauce. Ketchup. Onion, garlic, and table salt. Meat flavorings and tenderizers.  Choose: Fresh and dried herbs and spices. Low-sodium varieties of mustard and ketchup. Lemon juice. Tabasco sauce. Horseradish.    SAMPLE 1.5 GRAM SODIUM MEAL PLAN:   Breakfast / Sodium (mg)  1 cup low-fat milk / 143 mg  1 whole-wheat English muffin / 240 mg  1 tbs heart-healthy margarine / 153 mg  1 hard-boiled egg / 139 mg  1 small orange / 0 mg  Lunch / Sodium (mg)  1 cup raw carrots / 76 mg  2  tbs no salt added peanut butter / 5 mg  2 slices whole-wheat bread / 270 mg  1 tbs jelly / 6 mg   cup red grapes / 2 mg  Dinner / Sodium (mg)  1 cup whole-wheat pasta / 2 mg  1 cup low-sodium tomato sauce / 73 mg  3 oz lean ground beef / 57 mg  1 small side salad (1 cup raw spinach leaves,  cup cucumber,  cup yellow bell pepper) with 1 tsp olive oil and 1 tsp red wine vinegar / 25 mg  Snack / Sodium (mg)  1 container low-fat vanilla yogurt / 107 mg  3 graham cracker squares / 127 mg  Nutrient Analysis  Calories: 1745  Protein: 75 g  Carbohydrate: 237 g  Fat: 57 g  Sodium: 1425 mg  Document Released: 12/22/2004 Document Revised: 09/03/2010 Document Reviewed: 03/25/2009  ExitCare Patient Information 2012 [place].))    This information is not intended to replace advice given to you by your health care provider. Make sure you discuss any questions you have with your health care provider. Document Released: 12/25/2002 Document Revised: 07/12/2015 Document Reviewed: 06/03/2015 Elsevier Interactive Patient Education  2017 [employer].

## 2017-11-10 DIAGNOSIS — M47816 Spondylosis without myelopathy or radiculopathy, lumbar region: Secondary | ICD-10-CM | POA: Diagnosis not present

## 2017-11-22 DIAGNOSIS — G4733 Obstructive sleep apnea (adult) (pediatric): Secondary | ICD-10-CM | POA: Diagnosis not present

## 2017-12-06 ENCOUNTER — Other Ambulatory Visit: Payer: Self-pay | Admitting: Family Medicine

## 2017-12-08 DIAGNOSIS — M47816 Spondylosis without myelopathy or radiculopathy, lumbar region: Secondary | ICD-10-CM | POA: Diagnosis not present

## 2017-12-08 DIAGNOSIS — M47817 Spondylosis without myelopathy or radiculopathy, lumbosacral region: Secondary | ICD-10-CM | POA: Diagnosis not present

## 2017-12-08 DIAGNOSIS — I10 Essential (primary) hypertension: Secondary | ICD-10-CM | POA: Diagnosis not present

## 2017-12-08 DIAGNOSIS — E119 Type 2 diabetes mellitus without complications: Secondary | ICD-10-CM | POA: Diagnosis not present

## 2017-12-22 DIAGNOSIS — E7800 Pure hypercholesterolemia, unspecified: Secondary | ICD-10-CM | POA: Diagnosis not present

## 2017-12-22 DIAGNOSIS — Z981 Arthrodesis status: Secondary | ICD-10-CM | POA: Diagnosis not present

## 2017-12-22 DIAGNOSIS — K219 Gastro-esophageal reflux disease without esophagitis: Secondary | ICD-10-CM | POA: Diagnosis not present

## 2017-12-22 DIAGNOSIS — E119 Type 2 diabetes mellitus without complications: Secondary | ICD-10-CM | POA: Diagnosis not present

## 2017-12-22 DIAGNOSIS — G8929 Other chronic pain: Secondary | ICD-10-CM | POA: Diagnosis not present

## 2017-12-22 DIAGNOSIS — G4730 Sleep apnea, unspecified: Secondary | ICD-10-CM | POA: Diagnosis not present

## 2017-12-22 DIAGNOSIS — M109 Gout, unspecified: Secondary | ICD-10-CM | POA: Diagnosis not present

## 2017-12-22 DIAGNOSIS — M47817 Spondylosis without myelopathy or radiculopathy, lumbosacral region: Secondary | ICD-10-CM | POA: Diagnosis not present

## 2017-12-22 DIAGNOSIS — M47816 Spondylosis without myelopathy or radiculopathy, lumbar region: Secondary | ICD-10-CM | POA: Diagnosis not present

## 2017-12-22 DIAGNOSIS — G4733 Obstructive sleep apnea (adult) (pediatric): Secondary | ICD-10-CM | POA: Diagnosis not present

## 2017-12-22 DIAGNOSIS — I10 Essential (primary) hypertension: Secondary | ICD-10-CM | POA: Diagnosis not present

## 2017-12-28 ENCOUNTER — Other Ambulatory Visit: Payer: Self-pay | Admitting: Family Medicine

## 2018-01-10 ENCOUNTER — Other Ambulatory Visit: Payer: Self-pay | Admitting: Family Medicine

## 2018-01-26 ENCOUNTER — Other Ambulatory Visit: Payer: Self-pay | Admitting: Family Medicine

## 2018-01-26 DIAGNOSIS — M7061 Trochanteric bursitis, right hip: Secondary | ICD-10-CM | POA: Diagnosis not present

## 2018-01-26 DIAGNOSIS — M549 Dorsalgia, unspecified: Secondary | ICD-10-CM | POA: Diagnosis not present

## 2018-01-26 DIAGNOSIS — M47816 Spondylosis without myelopathy or radiculopathy, lumbar region: Secondary | ICD-10-CM | POA: Diagnosis not present

## 2018-01-26 DIAGNOSIS — M545 Low back pain: Secondary | ICD-10-CM | POA: Diagnosis not present

## 2018-02-02 ENCOUNTER — Other Ambulatory Visit: Payer: Self-pay | Admitting: Family Medicine

## 2018-02-02 DIAGNOSIS — F900 Attention-deficit hyperactivity disorder, predominantly inattentive type: Secondary | ICD-10-CM

## 2018-02-02 NOTE — Telephone Encounter (Signed)
LOV 11/08/2017 pt told to follow up in 3-4 months:  Patient has appointment on 02/08/2018 for follow up.  Medications was last 08/09/2017 for 90 day supply.  Reviewed [place] controled substance database and noted no aberrancies. Please review and advise. MPulliam, CMA/RT(R)

## 2018-02-08 ENCOUNTER — Encounter: Payer: Self-pay | Admitting: Family Medicine

## 2018-02-08 ENCOUNTER — Ambulatory Visit (INDEPENDENT_AMBULATORY_CARE_PROVIDER_SITE_OTHER): Payer: PPO | Admitting: Family Medicine

## 2018-02-08 VITALS — BP 124/87 | HR 61 | Temp 98.2°F | Ht 71.0 in | Wt 218.5 lb

## 2018-02-08 DIAGNOSIS — I10 Essential (primary) hypertension: Secondary | ICD-10-CM | POA: Diagnosis not present

## 2018-02-08 DIAGNOSIS — E1169 Type 2 diabetes mellitus with other specified complication: Secondary | ICD-10-CM

## 2018-02-08 DIAGNOSIS — N183 Chronic kidney disease, stage 3 unspecified: Secondary | ICD-10-CM | POA: Insufficient documentation

## 2018-02-08 DIAGNOSIS — E1159 Type 2 diabetes mellitus with other circulatory complications: Secondary | ICD-10-CM | POA: Diagnosis not present

## 2018-02-08 DIAGNOSIS — Z79899 Other long term (current) drug therapy: Secondary | ICD-10-CM | POA: Diagnosis not present

## 2018-02-08 DIAGNOSIS — E785 Hyperlipidemia, unspecified: Secondary | ICD-10-CM | POA: Diagnosis not present

## 2018-02-08 DIAGNOSIS — E119 Type 2 diabetes mellitus without complications: Secondary | ICD-10-CM | POA: Insufficient documentation

## 2018-02-08 DIAGNOSIS — I152 Hypertension secondary to endocrine disorders: Secondary | ICD-10-CM

## 2018-02-08 DIAGNOSIS — E669 Obesity, unspecified: Secondary | ICD-10-CM | POA: Diagnosis not present

## 2018-02-08 DIAGNOSIS — E559 Vitamin D deficiency, unspecified: Secondary | ICD-10-CM

## 2018-02-08 DIAGNOSIS — E1129 Type 2 diabetes mellitus with other diabetic kidney complication: Secondary | ICD-10-CM | POA: Insufficient documentation

## 2018-02-08 LAB — POCT GLYCOSYLATED HEMOGLOBIN (HGB A1C): Hemoglobin A1C: 6.6 % — AB (ref 4.0–5.6)

## 2018-02-08 MED ORDER — METFORMIN HCL ER 500 MG PO TB24: 500.0000 mg | ORAL_TABLET | Freq: Every day | ORAL | 1 refills | Status: DC

## 2018-02-08 NOTE — Progress Notes (Signed)
Impression and Recommendations:    1. Type 2 diabetes mellitus with other specified complication, without long-term current use of insulin ([place])   2. Hypertension associated with diabetes ([place])   3. Hyperlipidemia associated with type 2 diabetes mellitus ([place])   4. Vitamin D deficiency   5. Obesity, Class I, BMI 30-34.9   6. Chronic kidney disease (CKD), stage III (moderate) (HCC)   7. High risk medication use     CKD & Kidney Health - Lab work to be obtained in near future.  - Extensive education provided to patient today regarding importance of maintaining his kidney health.  - Continue to avoid use of Advil, alleve, and ibuprofen, and other nephrotoxic substances. - Continue to drink plenty of water per day. - Will continue to monitor.  1. Diet-Controlled Diabetes Mellitus - A1c is up to 6.6 from 6.5 last check. - Discussed that goal A1c is under 7.0.  - Blood sugar remains stable at this time. - Continue management on diet and lifestyle habits.  - In addition, discussed incorporating metformin. - Educated patient extensively about metformin in office today. - Begin metformin today.  In 6 weeks, return for kidney check (BMP with GFR).  - Discussed that 60-100 is normal blood sugar.  Given his recent spell of what he felt was low blood sugar (70), strongly advised patient to eat regularly and not skip meals.    - Counseled patient on pathophysiology of disease and discussed various treatment options, which often includes dietary and lifestyle modifications as first line.  Importance of low carb/ketogenic diet discussed with patient in addition to regular exercise.   - Check FBS and 2 hours after the biggest meal of your day.  Keep log and bring in next OV for my review.   Also, if you ever feel poorly, please check your blood pressure and blood sugar, as one or the other could be the cause of your symptoms.  - Being a diabetic, you need yearly eye and foot exams. Make  appt.for diabetic eye exam.  2. Hypertension - Stable at this time. - Continue treatment plan as prescribed.  See med list below. - Patient tolerating meds well without complication.  Denies S-E - Will continue to monitor.  3. Hyperlipidemia  - Need for lab work in near future.  - Continue treatment plan as prescribed.  See med list below. - Patient tolerating meds well without complication.  Denies S-E - Will continue to monitor.  4. BMI Counseling - BMI is 30.47 - Advised patient to decrease his BMI to under 30.0.  - Explained to patient what BMI refers to, and what it means medically.    Told patient to think about it as a "medical risk stratification measurement" and how increasing BMI is associated with increasing risk/ or worsening state of various diseases such as hypertension, hyperlipidemia, diabetes, premature OA, depression etc.  - American Heart Association guidelines for healthy diet, basically Mediterranean diet, and exercise guidelines of 30 minutes 5 days per week or more discussed in detail.  - Health counseling performed.  All questions answered.  5. Lifestyle & Preventative Health Maintenance - Advised patient to continue working toward exercising to improve overall mental, physical, and emotional health.    - Reviewed the "spokes of the wheel" of mood and health management.  Stressed the importance of ongoing prudent habits, including regular exercise, appropriate sleep hygiene, healthful dietary habits, and prayer/meditation to relax.  - Encouraged patient to engage in daily  physical activity, especially a formal exercise routine.  Recommended that the patient eventually strive for at least 150 minutes of moderate cardiovascular activity per week according to guidelines established by the [facility].   - Healthy dietary habits encouraged, including low-carb, and high amounts of lean protein in diet.   - Patient should also consume adequate amounts of  water.    Education and routine counseling performed. Handouts provided.  Orders Placed This Encounter  Procedures  . Comprehensive metabolic panel  . Lipid panel  . TSH  . T4, free  . Magnesium  . VITAMIN D 25 Hydroxy (Vit-D Deficiency, Fractures)  . Microalbumin / creatinine urine ratio  . BASIC METABOLIC PANEL WITH GFR  . POCT glycosylated hemoglobin (Hb A1C)     Meds ordered this encounter  Medications  . metFORMIN (GLUCOPHAGE XR) 500 MG 24 hr tablet    Sig: Take 1 tablet (500 mg total) by mouth daily with breakfast for 90 doses.    Dispense:  90 tablet    Refill:  1    The patient was counseled, risk factors were discussed, anticipatory guidance given.  Gross side effects, risk and benefits, and alternatives of medications discussed with patient.  Patient is aware that all medications have potential side effects and we are unable to predict every side effect or drug-drug interaction that may occur.  Expresses verbal understanding and consents to current therapy plan and treatment regimen.   Return for Tomorrow fasting blood work lab only, 6 weeks lab only; 46-monthOV with me-DM, BP etc.   Please see AVS handed out to patient at the end of our visit for further patient instructions/ counseling done pertaining to today's office visit.    Note:  This document was prepared using Dragon voice recognition software and may include unintentional dictation errors.   This document serves as a record of services personally performed by DMellody Dance DO. It was created on her behalf by KToni Amend a trained medical scribe. The creation of this record is based on the scribe's personal observations and the provider's statements to them.   I have reviewed the above medical documentation for accuracy and completeness and I concur.  DMellody Dance DO 02/08/2018 4:58 PM        Subjective:    Chief Complaint  Patient presents with  . Follow-up    [name] a 63y.o. [facility]    Overall, states "I'm doing just wonderful."  When asked what's new, he says "nothing but the weather."  Feels that his weight has been alright, staying in a certain range.  States that his goal weight range is around 210-215.  He is currently working out about three days per week.  Adderall Management Advised patient to make sure not to skip meals while taking Adderall.  DM HPI: -  He has been working on diet and exercise for diabetes  Pt is currently maintained on the following medications for diabetes: diet-controlled. Medication compliance - continues to be diet-controlled.  Home glucose readings range - not checking  Continues checking his blood sugar regularly.  Feels he may have had a low sugar incident a couple of weeks ago.  Notes that his sugar dropped to about 70.  When this occurred, he started shaking and sweating.  He may have missed a meal and not eaten for a while.  He thinks he went all day without eating lunch.  Patient's wife gave him some peanut butter and a piece of candy with sugar in it.  Patient went to lie down when he was feeling ill, and woke up feeling better in a couple of hours.   Denies polyuria/polydipsia. Denies hypo/ hyperglycemia symptoms - He denies new onset of: chest pain, exercise intolerance, shortness of breath, dizziness, visual changes, headache, lower extremity swelling or claudication.   Last diabetic eye exam was  Lab Results  Component Value Date   HMDIABEYEEXA No Retinopathy 10/05/2017    Foot exam- UTD  Last A1C in the office was:  Lab Results  Component Value Date   HGBA1C 6.6 (A) 02/08/2018   HGBA1C 6.5 (A) 11/08/2017   HGBA1C 6.3 (H) 07/23/2017    Lab Results  Component Value Date   MICROALBUR 80 12/07/2016   LDLCALC 78 07/23/2017   CREATININE 1.8 (A) 09/22/2017      Last 3 blood pressure readings in our  office are as follows: BP Readings from Last 3 Encounters:  02/08/18 124/87  11/08/17 134/78  08/30/17 108/71    BMI Readings from Last 3 Encounters:  02/08/18 30.47 kg/m  11/08/17 30.04 kg/m  08/30/17 31.10 kg/m     Problem  Diabetes Mellitus (Hcc)  Chronic Kidney Disease (Ckd), Stage III (Moderate) (Hcc)      Patient Care Team    Relationship Specialty Notifications Start End  Mellody Dance, DO PCP - General Family Medicine  10/28/16   Rutherford Guys, MD Consulting Physician Ophthalmology  11/23/14   Jovita Gamma, MD Consulting Physician Neurosurgery  11/23/14   Kathie Rhodes, MD Consulting Physician Urology  12/07/14   Milus Banister, MD Attending Physician Gastroenterology  12/07/14   Chesley Mires, MD Consulting Physician Pulmonary Disease  12/07/16   Garald Balding, MD Consulting Physician Orthopedic Surgery  12/07/16    Comment: back and hip  Dene Gentry, MD Consulting Physician Sports Medicine  12/07/16    Comment: back and hip pain  Corliss Parish, MD Consulting Physician Nephrology  11/08/17    Comment: Sees her every 6 months     Patient Active Problem List   Diagnosis Date Noted  . Hypertension associated with diabetes ([place]) 12/01/2011    Priority: High  . Diabetes mellitus with renal manifestation ([place]) 06/11/2006    Priority: High  . Hyperlipidemia associated with type 2 diabetes mellitus ([place]) 05/26/2006    Priority: High  . OBSTRUCTIVE SLEEP APNEA 05/26/2006    Priority: High  . Attention deficit hyperactivity disorder (ADHD), predominantly inattentive type 07/26/2017    Priority: Medium  . Prostatitis 04/20/2014    Priority: Medium  . Mood disorder ([place]) 03/29/2013    Priority: Medium  . Renal insufficiency 12/01/2011    Priority: Medium  . 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 Vitamin D deficiency 11/30/2007    Priority: Low  . Diabetes mellitus ([place]) 02/08/2018  . Chronic kidney  disease (CKD), stage III (moderate) ([place]) 02/08/2018  . Tinea corporis 11/08/2017  . Chronic left hip pain 07/26/2017  . Facet arthropathy, lumbosacral 07/26/2017  . DDD (degenerative disc disease), lumbosacral 07/26/2017  . 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 Obesity, Class I, BMI 30-34.9 04/25/2017  . Noncompliance 03/24/2017  . Abdominal bloating 05/15/2015  . UTI (urinary tract infection) 04/20/2014  . Hyperglycemia 04/20/2014  . Fatigue 03/07/2014  . Family history of early CAD 03/07/2014  . Maxillary sinusitis, acute  12/06/2013  . Tinea versicolor 12/06/2013  . Physical exam 11/06/2013  . Other malaise and fatigue 03/29/2013  . Decreased libido 03/29/2013  . Hematuria 02/17/2013  . Right elbow pain 12/15/2012  . Pustular folliculitis 26/83/4196  . Left ankle pain 03/15/2012  . SPINAL STENOSIS, LUMBAR 08/02/2008  . GOUT 11/30/2007  . BPH (benign prostatic hyperplasia) 11/30/2007  . ANEMIA NEC 06/11/2006  . Chronic back pain greater than 3 months duration 06/11/2006     Past Medical History:  Diagnosis Date  . Carpal tunnel syndrome, bilateral   . Carpal tunnel syndrome, right    nerve impingement right arm/wears brace  . Diabetes mellitus    no meds  . GERD (gastroesophageal reflux disease)   . Gout   . Hepatic steatosis   . Hyperlipidemia   . Hypertension   . Obstructive sleep apnea      Past Surgical History:  Procedure Laterality Date  . CARPAL TUNNEL RELEASE  09/10/2015   RIGHT WRIST / WITH NERVE IMPINGEMENT SURGERY  . CERVICAL FUSION     Dr Sherwood Gambler  . COLONOSCOPY  2007   negative; [facility]  . ESOPHAGEAL DILATION  2007  . 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 LUMBAR FUSION      Dr Sherwood Gambler     Family History  Problem Relation Age of Onset  . Heart disease Father        pacer  . Urolithiasis Father   . Heart attack Father   .  Aneurysm Mother 50       CNS aneurysm  . Sudden death Mother   . Diabetes Sister   . Cancer Maternal Uncle        ? primary  . Cancer Paternal Uncle        ? primary  . Kidney disease Brother   . Heart disease Sister   . Heart disease Sister   . Hyperlipidemia Neg Hx   . Hypertension Neg Hx   . Colon cancer Neg Hx      Social History   Substance and Sexual Activity  Drug Use No  ,  Social History   Substance and Sexual Activity  Alcohol Use No  ,  Social History   Tobacco Use  Smoking Status Former Smoker  . Last attempt to quit: 01/06/1976  . Years since quitting: 42.1  Smokeless Tobacco Never Used  Tobacco Comment   smoked 35 years ago as of 2013   ,    Current Outpatient Medications on File Prior to Visit  Medication Sig Dispense Refill  . allopurinol (ZYLOPRIM) 300 MG tablet Take 0.5 tablets (150 mg total) by mouth daily. 45 tablet 1  . amphetamine-dextroamphetamine (ADDERALL) 10 MG tablet TAKE 1 TABLET BY MOUTH TWICE DAILY 180 tablet 0  . Ascorbic Acid (VITAMIN C) 1000 MG tablet Take 1,000 mg by mouth daily.    Marland Kitchen aspirin 81 MG tablet Take 81 mg by mouth daily.      Marland Kitchen atorvastatin (LIPITOR) 40 MG tablet TAKE 1 TABLET BY MOUTH EACH NIGHT AT BEDTIME 90 tablet 1  . benazepril (LOTENSIN) 20 MG tablet TAKE 1 TABLET BY MOUTH DAILY 90 tablet 1  . blood glucose meter kit and supplies Dispense based on patient and insurance preference. Use to check fasting blood glucose in the morning and to check glucose 2 hours after largest meal of the day. (FOR ICD-10 E10.9, E11.9). 1 each 0  . clotrimazole-betamethasone (LOTRISONE) cream Apply 1 application  topically 2 (two) times daily. 80 g 1  . FLUoxetine (PROZAC) 40 MG capsule TAKE 1 CAPSULE BY MOUTH DAILY 90 capsule 1  . fluticasone (FLONASE) 50 MCG/ACT nasal spray Place 2 sprays into both nostrils daily. 16 g 6  . gabapentin (NEURONTIN) 800 MG tablet TAKE 1 TABLET BY MOUTH 3 TIMES DAILY 90 tablet 1  . glucose blood test strip  Use as instructed 100 each 12  . LANCETS ULTRA FINE MISC Use to check fasting blood glucose in the morning and to check glucose 2 hours after largest meal of the day 100 each 12  . Multiple Vitamins-Iron (MULTIVITAMINS WITH IRON) TABS Take 1 tablet by mouth daily. 30 tablet 0  . MYRBETRIQ 25 MG TB24 tablet     . pantoprazole (PROTONIX) 40 MG tablet TAKE 1 TABLET BY MOUTH DAILY 90 tablet 1  . polyethylene glycol powder (GLYCOLAX/MIRALAX) powder USE AS DIRECTED 850 g 11  . pyridoxine (B-6) 100 MG tablet Take 100 mg by mouth daily.    . traZODone (DESYREL) 50 MG tablet TAKE 1/2 TO 1 TABLET BY MOUTH AT BEDTIMEAS NEEDED FOR SLEEP 30 tablet 6  . Vitamin D, Ergocalciferol, (DRISDOL) 50000 units CAPS capsule Take 1 capsule (50,000 Units total) by mouth every 7 (seven) days. 12 capsule 10   No current facility-administered medications on file prior to visit.      Allergies  Allergen Reactions  . Prednisone Other (See Comments)    Caused blood sugar to increase, pt will not take     Review of Systems:   General:  Denies fever, chills Optho/Auditory:   Denies visual changes, blurred vision Respiratory:   Denies SOB, cough, wheeze, DIB  Cardiovascular:   Denies chest pain, palpitations, painful respirations Gastrointestinal:   Denies nausea, vomiting, diarrhea.  Endocrine:     Denies new hot or cold intolerance Musculoskeletal:  Denies joint swelling, gait issues, or new unexplained myalgias/ arthralgias Skin:  Denies rash, suspicious lesions  Neurological:    Denies dizziness, unexplained weakness, numbness  Psychiatric/Behavioral:   Denies mood changes    Objective:     Blood pressure 124/87, pulse 61, temperature 98.2 F (36.8 C), height '5\' 11"'  (1.803 m), weight 218 lb 8 oz (99.1 kg), SpO2 99 %. Body mass index is 30.47 kg/m. General: Well Developed, well nourished, and in no acute distress.  HEENT: Normocephalic, atraumatic, pupils equal round reactive to light, neck supple, No  carotid bruits, no JVD Skin: Warm and dry, cap RF less 2 sec Cardiac: Regular rate and rhythm, S1, S2 WNL's, no murmurs rubs or gallops Respiratory: ECTA B/L, Not using accessory muscles, speaking in full sentences. NeuroM-Sk: Ambulates w/o assistance, moves ext * 4 w/o difficulty, sensation grossly intact.  Ext: scant edema b/l lower ext Psych: No HI/SI, judgement and insight good, Euthymic mood. Full Affect.

## 2018-02-08 NOTE — Patient Instructions (Signed)
-6 weeks come in for a BMP with GFR lab only visit then follow-up with me in 3 months after starting metformin.  -Also you will come in in the very near future for fasting blood work at your convenience     Diabetes Mellitus and Standards of [place]  Managing diabetes (diabetes mellitus) can be complicated. Your diabetes treatment may be managed by a team of health care providers, including:  A diet and nutrition specialist (registered dietitian).  A nurse.  A certified diabetes educator (CDE).  A diabetes specialist (endocrinologist).  An eye doctor.  A primary care provider.  A dentist.  Your health care providers follow a schedule in order to help you get the best quality of care. The following schedule is a general guideline for your diabetes management plan. Your health care providers may also give you more specific instructions.  HbA1c (hemoglobin A1c) test This test provides information about blood sugar (glucose) control over the previous 2-3 months. It is used to check whether your diabetes management plan needs to be adjusted.  If you are meeting your treatment goals, this test is done at least 2 times a year.  If you are not meeting treatment goals or if your treatment goals have changed, this test is done 4 times a year.  Blood pressure test  This test is done at every routine medical visit. For most people, the goal is less than 130/80. Ask your health care provider what your goal blood pressure should be.  Dental and eye exams  Visit your dentist two times a year.  If you have type 1 diabetes, get an eye exam 3-5 years after you are diagnosed, and then once a year after your first exam. ? If you were diagnosed with type 1 diabetes as a child, get an eye exam when you are age 68 or older and have had diabetes for 3-5 years. After the first exam, you should get an eye exam once a year.  If you have type 2 diabetes, have an eye exam as soon as you are  diagnosed, and then once a year after your first exam.  Foot care exam  Visual foot exams are done at every routine medical visit. The exams check for cuts, bruises, redness, blisters, sores, or other problems with the feet.  A complete foot exam is done by your health care provider once a year. This exam includes an inspection of the structure and skin of your feet, and a check of the pulses and sensation in your feet. ? Type 1 diabetes: Get your first exam 3-5 years after diagnosis. ? Type 2 diabetes: Get your first exam as soon as you are diagnosed.  Check your feet every day for cuts, bruises, redness, blisters, or sores. If you have any of these or other problems that are not healing, contact your health care provider.  Kidney function test (urine microalbumin)  This test is done once a year. ? Type 1 diabetes: Get your first test 5 years after diagnosis. ? Type 2 diabetes: Get your first test as soon as you are diagnosed._  If you have chronic kidney disease (CKD), get a serum creatinine and estimated glomerular filtration rate (eGFR) test once a year.  Lipid profile (cholesterol, HDL, LDL, triglycerides)  This test should be done when you are diagnosed with diabetes, and every 5 years after the first test. If you are on medicines to lower your cholesterol, you may need to get this test done every  year. ? The goal for LDL is less than 100 mg/dL (5.5 mmol/L). If you are at high risk, the goal is less than 70 mg/dL (3.9 mmol/L). ? The goal for HDL is 40 mg/dL (2.2 mmol/L) for men and 50 mg/dL(2.8 mmol/L) for women. An HDL cholesterol of 60 mg/dL (3.3 mmol/L) or higher gives some protection against heart disease. ? The goal for triglycerides is less than 150 mg/dL (8.3 mmol/L).  Immunizations  The yearly flu (influenza) vaccine is recommended for everyone 6 months or older who has diabetes.  The pneumonia (pneumococcal) vaccine is recommended for everyone 2 years or older who has  diabetes. If you are 19 or older, you may get the pneumonia vaccine as a series of two separate shots.  The hepatitis B vaccine is recommended for adults shortly after they have been diagnosed with diabetes.  The Tdap (tetanus, diphtheria, and pertussis) vaccine should be given: ? According to normal childhood vaccination schedules, for children. ? Every 10 years, for adults who have diabetes.  The shingles vaccine is recommended for people who have had chicken pox and are 50 years or older.  Mental and emotional health  Screening for symptoms of eating disorders, anxiety, and depression is recommended at the time of diagnosis and afterward as needed. If your screening shows that you have symptoms (you have a positive screening result), you may need further evaluation and be referred to a mental health care provider.  Diabetes self-management education  Education about how to manage your diabetes is recommended at diagnosis and ongoing as needed.  Treatment plan  Your treatment plan will be reviewed at every medical visit.  Summary  Managing diabetes (diabetes mellitus) can be complicated. Your diabetes treatment may be managed by a team of health care providers.  Your health care providers follow a schedule in order to help you get the best quality of care.  Standards of care including having regular physical exams, blood tests, blood pressure monitoring, immunizations, screening tests, and education about how to manage your diabetes.  Your health care providers may also give you more specific instructions based on your individual health.      Type 2 Diabetes Mellitus, Self Care, Adult Caring for yourself after you have been diagnosed with type 2 diabetes (type 2 diabetes mellitus) means keeping your blood sugar (glucose) under control with a balance of:  Nutrition.  Exercise.  Lifestyle changes.  Medicines or insulin, if necessary.  Support from your team of health  care providers and others.  The following information explains what you need to know to manage your diabetes at home. What do I need to do to manage my blood glucose?  Check your blood glucose every day, as often as told by your health care provider.  Contact your health care provider if your blood glucose is above your target for 2 tests in a row.  Have your A1c (hemoglobin A1c) level checked at least two times a year, or as often as told by your health care provider. Your health care provider will set individualized treatment goals for you. Generally, the goal of treatment is to maintain the following blood glucose levels:  Before meals (preprandial): 80-130 mg/dL (4.4-7.2 mmol/L).  After meals (postprandial): below 180 mg/dL (10 mmol/L).  A1c level: less than 7%.  What do I need to know about hyperglycemia and hypoglycemia? What is hyperglycemia? Hyperglycemia, also called high blood glucose, occurs when blood glucose is too high.Make sure you know the early signs of hyperglycemia,  such as:  Increased thirst.  Hunger.  Feeling very tired.  Needing to urinate more often than usual.  Blurry vision.  What is hypoglycemia? Hypoglycemia, also called low blood glucose, occurswith a blood glucose level at or below 70 mg/dL (3.9 mmol/L). The risk for hypoglycemia increases during or after exercise, during sleep, during illness, and when skipping meals or not eating for a long time (fasting). It is important to know the symptoms of hypoglycemia and treat it right away. Always have a 15-gram rapid-acting carbohydrate snack with you to treat low blood glucose. Family members and close friends should also know the symptoms and should understand how to treat hypoglycemia, in case you are not able to treat yourself. What are the symptoms of hypoglycemia? Hypoglycemia symptoms can include:  Hunger.  Anxiety.  Sweating and feeling clammy.  Confusion.  Dizziness or feeling  light-headed.  Sleepiness.  Nausea.  Increased heart rate.  Headache.  Blurry vision.  Seizure.  Nightmares.  Tingling or numbness around the mouth, lips, or tongue.  A change in speech.  Decreased ability to concentrate.  A change in coordination.  Restless sleep.  Tremors or shakes.  Fainting.  Irritability.  How do I treat hypoglycemia?  If you are alert and able to swallow safely, follow the 15:15 rule:  Take 15 grams of a rapid-acting carbohydrate. Rapid-acting options include: ? 1 tube of glucose gel. ? 3 glucose pills. ? 6-8 pieces of hard candy. ? 4 oz (120 mL) of fruit juice. ? 4 oz (120 mL) of regular (not diet) soda.  Check your blood glucose 15 minutes after you take the carbohydrate.  If the repeat blood glucose level is still at or below 70 mg/dL (3.9 mmol/L), take 15 grams of a carbohydrate again.  If your blood glucose level does not increase above 70 mg/dL (3.9 mmol/L) after 3 tries, seek emergency medical care.  After your blood glucose level returns to normal, eat a meal or a snack within 1 hour.  How do I treat severe hypoglycemia? Severe hypoglycemia is when your blood glucose level is at or below 54 mg/dL (3 mmol/L). Severe hypoglycemia is an emergency. Do not wait to see if the symptoms will go away. Get medical help right away. Call your local emergency services (911 in the [place] Do not drive yourself to the hospital. If you have severe hypoglycemia and you cannot eat or drink, you may need an injection of glucagon. A family member or close friend should learn how to check your blood glucose and how to give you a glucagon injection. Ask your health care provider if you need to have an emergency glucagon injection kit available. Severe hypoglycemia may need to be treated in a hospital. The treatment may include getting glucose through an IV tube. You may also need treatment for the cause of your hypoglycemia. Can having diabetes put me at  risk for other conditions? Having diabetes can put you at risk for other long-term (chronic) conditions, such as heart disease and kidney disease. Your health care provider may prescribe medicines to help prevent complications from diabetes. These medicines may include:  Aspirin.  Medicine to lower cholesterol.  Medicine to control blood pressure.  What else can I do to manage my diabetes? Take your diabetes medicines as told  If your health care provider prescribed insulin or diabetes medicines, take them every day.  Do not run out of insulin or other diabetes medicines that you take. Plan ahead so you always have  these available.  If you use insulin, adjust your dosage based on how physically active you are and what foods you eat. Your health care provider will tell you how to adjust your dosage. Make healthy food choices  The things that you eat and drink affect your blood glucose and your insulin dosage. Making good choices helps to control your diabetes and prevent other health problems. A healthy meal plan includes eating lean proteins, complex carbohydrates, fresh fruits and vegetables, low-fat dairy products, and healthy fats. Make an appointment to see a diet and nutrition specialist (registered dietitian) to help you create an eating plan that is right for you. Make sure that you:  Follow instructions from your health care provider about eating or drinking restrictions.  Drink enough fluid to keep your urine clear or pale yellow.  Eat healthy snacks between nutritious meals.  Track the carbohydrates that you eat. Do this by reading food labels and learning the standard serving sizes of foods.  Follow your sick day plan whenever you cannot eat or drink as usual. Make this plan in advance with your health care provider.  Stay active  Exercise regularly, as told by your health care provider. This may include:  Stretching and doing strength exercises, such as yoga or  weightlifting, at least 2 times a week.  Doing at least 150 minutes of moderate-intensity or vigorous-intensity exercise each week. This could be brisk walking, biking, or water aerobics. ? Spread out your activity over at least 3 days of the week. ? Do not go more than 2 days in a row without doing some kind of physical activity.  When you start a new exercise or activity, work with your health care provider to adjust your insulin, medicines, or food intake as needed. Make healthy lifestyle choices  Do not use any tobacco products, such as cigarettes, chewing tobacco, and e-cigarettes. If you need help quitting, ask your health care provider.  If your health care provider says that alcohol is safe for you, limit alcohol intake to no more than 1 drink per day for nonpregnant women and 2 drinks per day for men. One drink equals 12 oz of beer, 5 oz of wine, or 1 oz of hard liquor.  Learn to manage stress. If you need help with this, ask your health care provider. Care for your body   Keep your immunizations up to date. In addition to getting vaccinations as told by your health care provider, it is recommended that you get vaccinated against the following illnesses: ? The flu (influenza). Get a flu shot every year. ? Pneumonia. ? Hepatitis B.  Schedule an eye exam soon after your diagnosis, and then one time every year after that.  Check your skin and feet every day for cuts, bruises, redness, blisters, or sores. Schedule a foot exam with your health care provider once every year.  Brush your teeth and gums two times a day, and floss at least one time a day. Visit your dentist at least once every 6 months.  Maintain a healthy weight. General instructions  Take over-the-counter and prescription medicines only as told by your health care provider.  Share your diabetes management plan with people in your workplace, school, and household.  Check your urine for ketones when you are ill  and as told by your health care provider.  Ask your health care provider: ? Do I need to meet with a diabetes educator? ? Where can I find a support group for people  with diabetes?  Carry a medical alert card or wear medical alert jewelry.  Keep all follow-up visits as told by your health care provider. This is important. Where to find more information: For more information about diabetes, visit:  American Diabetes Association (ADA): www.diabetes.org  American Association of Diabetes Educators (AADE): www.diabeteseducator.org/patient-resources  This information is not intended to replace advice given to you by your health care provider. Make sure you discuss any questions you have with your health care provider. Document Released: 04/15/2015 Document Revised: 05/30/2015 Document Reviewed: 01/25/2015 Elsevier Interactive Patient Education  2017 [place].      Blood Glucose Monitoring, Adult Monitoring your blood sugar (glucose) helps you manage your diabetes. It also helps you and your health care provider determine how well your diabetes management plan is working. Blood glucose monitoring involves checking your blood glucose as often as directed, and keeping a record (log) of your results over time. Why should I monitor my blood glucose? Checking your blood glucose regularly can:  Help you understand how food, exercise, illnesses, and medicines affect your blood glucose.  Let you know what your blood glucose is at any time. You can quickly tell if you are having low blood glucose (hypoglycemia) or high blood glucose (hyperglycemia).  Help you and your health care provider adjust your medicines as needed.  When should I check my blood glucose? Follow instructions from your health care provider about how often to check your blood glucose.   This may depend on:  The type of diabetes you have.  How well-controlled your diabetes is.  Medicines you are taking.  If you have  type 1 diabetes:  Check your blood glucose at least 2 times a day.  Also check your blood glucose: ? Before every insulin injection. ? Before and after exercise. ? Between meals. ? 2 hours after a meal. ? Occasionally between 2:00 a.m. and 3:00 a.m., as directed. ? Before potentially dangerous tasks, like driving or using heavy machinery. ? At bedtime.  You may need to check your blood glucose more often, up to 6-10 times a day: ? If you use an insulin pump. ? If you need multiple daily injections (MDI). ? If your diabetes is not well-controlled. ? If you are ill. ? If you have a history of severe hypoglycemia. ? If you have a history of not knowing when your blood glucose is getting low (hypoglycemia unawareness).  If you have type 2 diabetes:  If you take insulin or other diabetes medicines, check your blood glucose at least 2 times a day.  If you are on intensive insulin therapy, check your blood glucose at least 4 times a day. Occasionally, you may also need to check between 2:00 a.m. and 3:00 a.m., as directed.  Also check your blood glucose: ? Before and after exercise. ? Before potentially dangerous tasks, like driving or using heavy machinery.  You may need to check your blood glucose more often if: ? Your medicine is being adjusted. ? Your diabetes is not well-controlled. ? You are ill.  What is a blood glucose log?  A blood glucose log is a record of your blood glucose readings. It helps you and your health care provider: ? Look for patterns in your blood glucose over time. ? Adjust your diabetes management plan as needed.  Every time you check your blood glucose, write down your result and notes about things that may be affecting your blood glucose, such as your diet and exercise for the  day.  Most glucose meters store a record of glucose readings in the meter. Some meters allow you to download your records to a computer. How do I check my blood  glucose? Follow these steps to get accurate readings of your blood glucose: Supplies needed   Blood glucose meter.  Test strips for your meter. Each meter has its own strips. You must use the strips that come with your meter.  A needle to prick your finger (lancet). Do not use lancets more than once.  A device that holds the lancet (lancing device).  A journal or log book to write down your results.  Procedure  Wash your hands with soap and water.  Prick the side of your finger (not the tip) with the lancet. Use a different finger each time.  Gently rub the finger until a small drop of blood appears.  Follow instructions that come with your meter for inserting the test strip, applying blood to the strip, and using your blood glucose meter.  Write down your result and any notes.  Alternative testing sites  Some meters allow you to use areas of your body other than your finger (alternative sites) to test your blood.  If you think you may have hypoglycemia, or if you have hypoglycemia unawareness, do not use alternative sites. Use your finger instead.  Alternative sites may not be as accurate as the fingers, because blood flow is slower in these areas. This means that the result you get may be delayed, and it may be different from the result that you would get from your finger.  The most common alternative sites are: ? Forearm. ? Thigh. ? Palm of the hand.  Additional tips  Always keep your supplies with you.  If you have questions or need help, all blood glucose meters have a 24-hour "hotline" number that you can call. You may also contact your health care provider.  After you use a few boxes of test strips, adjust (calibrate) your blood glucose meter by following instructions that came with your meter.    The American Diabetes Association suggests the following targets for most nonpregnant adults with diabetes.  More or less stringent glycemic goals may be appropriate  for each individual.  A1C: Less than 7% A1C may also be reported as eAG: Less than 154 mg/dl Before a meal (preprandial plasma glucose): 80-130 mg/dl 1-2 hours after beginning of the meal (Postprandial plasma glucose)*: Less than 180 mg/dl  *Postprandial glucose may be targeted if A1C goals are not met despite reaching preprandial glucose goals.   GOALS in short:  The goals are for the Hgb A1C to be less than 7.0 & blood pressure to be less than 130/80.    It is recommended that all diabetics are educated on and follow a healthy diabetic diet, exercise for 30 minutes 3-4 times per week (walking, biking, swimming, or machine), monitor blood glucose readings and bring that record with you to be reviewed at your next office visit.     You should be checking fasting blood sugars- especially after you eat poorly or eat really healthy, and also check 2 hour postprandial blood sugars after largest meal of the day.    Write these down and bring in your log at each office visit.    You will need to be seen every 3 months by the provider managing your Diabetes unless told otherwise by that provider.   You will need yearly eye exams from an eye specialist and foot  exams to check the nerves of your feet.  Also, your urine should be checked yearly as well to make sure excess protein is not present.   If you are checking your blood pressure at home, please record it and bring it to your next office visit.    Follow the Dietary Approaches to Stop Hypertension (DASH) diet (3 servings of fruit and vegetables daily, whole grains, low sodium, low-fat proteins).  See below.    Lastly, when it comes to your cholesterol, the goal is to have the HDL (good cholesterol) >40, and the LDL (bad cholesterol) <100.   It is recommended that you follow a heart healthy, low saturated and trans-fat diet and exercise for 30 minutes at least 5 times a week.     (( Check out the DASH diet = 1.5 Gram Low Sodium Diet   A 1.5  gram sodium diet restricts the amount of sodium in the diet to no more than 1.5 g or 1500 mg daily.  The American Heart Association recommends Americans over the age of 47 to consume no more than 1500 mg of sodium each day to reduce the risk of developing high blood pressure.  Research also shows that limiting sodium may reduce heart attack and stroke risk.  Many foods contain sodium for flavor and sometimes as a preservative.  When the amount of sodium in a diet needs to be low, it is important to know what to look for when choosing foods and drinks.  The following includes some information and guidelines to help make it easier for you to adapt to a low sodium diet.    QUICK TIPS  Do not add salt to food.  Avoid convenience items and fast food.  Choose unsalted snack foods.  Buy lower sodium products, often labeled as "lower sodium" or "no salt added."  Check food labels to learn how much sodium is in 1 serving.  When eating at a restaurant, ask that your food be prepared with less salt or none, if possible.    READING FOOD LABELS FOR SODIUM INFORMATION  The nutrition facts label is a good place to find how much sodium is in foods. Look for products with no more than 400 mg of sodium per serving.  Remember that 1.5 g = 1500 mg.  The food label may also list foods as:  Sodium-free: Less than 5 mg in a serving.  Very low sodium: 35 mg or less in a serving.  Low-sodium: 140 mg or less in a serving.  Light in sodium: 50% less sodium in a serving. For example, if a food that usually has 300 mg of sodium is changed to become light in sodium, it will have 150 mg of sodium.  Reduced sodium: 25% less sodium in a serving. For example, if a food that usually has 400 mg of sodium is changed to reduced sodium, it will have 300 mg of sodium.    CHOOSING FOODS  Grains  Avoid: Salted crackers and snack items. Some cereals, including instant hot cereals. Bread stuffing and biscuit mixes. Seasoned rice or  pasta mixes.  Choose: Unsalted snack items. Low-sodium cereals, oats, puffed wheat and rice, shredded wheat. English muffins and bread. Pasta.  Meats  Avoid: Salted, canned, smoked, spiced, pickled meats, including fish and poultry. Bacon, ham, sausage, cold cuts, hot dogs, anchovies.  Choose: Low-sodium canned tuna and salmon. Fresh or frozen meat, poultry, and fish.  Dairy  Avoid: Processed cheese and spreads. Cottage cheese. Buttermilk and  condensed milk. Regular cheese.  Choose: Milk. Low-sodium cottage cheese. Yogurt. Sour cream. Low-sodium cheese.  Fruits and Vegetables  Avoid: Regular canned vegetables. Regular canned tomato sauce and paste. Frozen vegetables in sauces. Olives. Angie Fava. Relishes. Sauerkraut.  Choose: Low-sodium canned vegetables. Low-sodium tomato sauce and paste. Frozen or fresh vegetables. Fresh and frozen fruit.  Condiments  Avoid: Canned and packaged gravies. Worcestershire sauce. Tartar sauce. Barbecue sauce. Soy sauce. Steak sauce. Ketchup. Onion, garlic, and table salt. Meat flavorings and tenderizers.  Choose: Fresh and dried herbs and spices. Low-sodium varieties of mustard and ketchup. Lemon juice. Tabasco sauce. Horseradish.    SAMPLE 1.5 GRAM SODIUM MEAL PLAN:   Breakfast / Sodium (mg)  1 cup low-fat milk / 143 mg  1 whole-wheat English muffin / 240 mg  1 tbs heart-healthy margarine / 153 mg  1 hard-boiled egg / 139 mg  1 small orange / 0 mg  Lunch / Sodium (mg)  1 cup raw carrots / 76 mg  2 tbs no salt added peanut butter / 5 mg  2 slices whole-wheat bread / 270 mg  1 tbs jelly / 6 mg   cup red grapes / 2 mg  Dinner / Sodium (mg)  1 cup whole-wheat pasta / 2 mg  1 cup low-sodium tomato sauce / 73 mg  3 oz lean ground beef / 57 mg  1 small side salad (1 cup raw spinach leaves,  cup cucumber,  cup yellow bell pepper) with 1 tsp olive oil and 1 tsp red wine vinegar / 25 mg  Snack / Sodium (mg)  1 container low-fat vanilla yogurt / 107 mg  3  graham cracker squares / 127 mg  Nutrient Analysis  Calories: 1745  Protein: 75 g  Carbohydrate: 237 g  Fat: 57 g  Sodium: 1425 mg  Document Released: 12/22/2004 Document Revised: 09/03/2010 Document Reviewed: 03/25/2009  ExitCare Patient Information 2012 [place].))    This information is not intended to replace advice given to you by your health care provider. Make sure you discuss any questions you have with your health care provider. Document Released: 12/25/2002 Document Revised: 07/12/2015 Document Reviewed: 06/03/2015 Elsevier Interactive Patient Education  2017 [employer].

## 2018-02-09 ENCOUNTER — Other Ambulatory Visit (INDEPENDENT_AMBULATORY_CARE_PROVIDER_SITE_OTHER): Payer: PPO

## 2018-02-09 DIAGNOSIS — N183 Chronic kidney disease, stage 3 unspecified: Secondary | ICD-10-CM

## 2018-02-09 DIAGNOSIS — E1169 Type 2 diabetes mellitus with other specified complication: Secondary | ICD-10-CM

## 2018-02-09 DIAGNOSIS — E785 Hyperlipidemia, unspecified: Secondary | ICD-10-CM

## 2018-02-09 DIAGNOSIS — E669 Obesity, unspecified: Secondary | ICD-10-CM | POA: Diagnosis not present

## 2018-02-09 DIAGNOSIS — E559 Vitamin D deficiency, unspecified: Secondary | ICD-10-CM

## 2018-02-09 DIAGNOSIS — I10 Essential (primary) hypertension: Secondary | ICD-10-CM | POA: Diagnosis not present

## 2018-02-09 DIAGNOSIS — E1159 Type 2 diabetes mellitus with other circulatory complications: Secondary | ICD-10-CM

## 2018-02-09 DIAGNOSIS — I152 Hypertension secondary to endocrine disorders: Secondary | ICD-10-CM

## 2018-02-09 NOTE — Addendum Note (Signed)
Addended by: Lanier Prude D on: 02/09/2018 09:32 AM   Modules accepted: Orders

## 2018-02-10 LAB — COMPREHENSIVE METABOLIC PANEL
ALT: 22 IU/L (ref 0–44)
AST: 34 IU/L (ref 0–40)
Albumin/Globulin Ratio: 1.6 (ref 1.2–2.2)
Albumin: 4.3 g/dL (ref 3.8–4.8)
Alkaline Phosphatase: 88 IU/L (ref 39–117)
BUN/Creatinine Ratio: 16 (ref 10–24)
BUN: 28 mg/dL — ABNORMAL HIGH (ref 8–27)
Bilirubin Total: 0.3 mg/dL (ref 0.0–1.2)
CO2: 23 mmol/L (ref 20–29)
Calcium: 9.4 mg/dL (ref 8.6–10.2)
Chloride: 100 mmol/L (ref 96–106)
Creatinine, Ser: 1.76 mg/dL — ABNORMAL HIGH (ref 0.76–1.27)
GFR calc Af Amer: 47 mL/min/{1.73_m2} — ABNORMAL LOW (ref >59)
GFR calc non Af Amer: 40 mL/min/{1.73_m2} — ABNORMAL LOW (ref >59)
Globulin, Total: 2.7 g/dL (ref 1.5–4.5)
Glucose: 119 mg/dL — ABNORMAL HIGH (ref 65–99)
Potassium: 4.8 mmol/L (ref 3.5–5.2)
Sodium: 138 mmol/L (ref 134–144)
Total Protein: 7 g/dL (ref 6.0–8.5)

## 2018-02-10 LAB — LIPID PANEL
Chol/HDL Ratio: 3 ratio (ref 0.0–5.0)
Cholesterol, Total: 128 mg/dL (ref 100–199)
HDL: 43 mg/dL (ref >39)
LDL Calculated: 72 mg/dL (ref 0–99)
Triglycerides: 65 mg/dL (ref 0–149)
VLDL Cholesterol Cal: 13 mg/dL (ref 5–40)

## 2018-02-10 LAB — MICROALBUMIN / CREATININE URINE RATIO
Creatinine, Urine: 104.1 mg/dL
Microalb/Creat Ratio: 33 mg/g creat — ABNORMAL HIGH (ref 0–29)
Microalbumin, Urine: 34.2 ug/mL

## 2018-02-10 LAB — TSH: TSH: 3.37 u[IU]/mL (ref 0.450–4.500)

## 2018-02-10 LAB — MAGNESIUM: Magnesium: 2 mg/dL (ref 1.6–2.3)

## 2018-02-10 LAB — T4, FREE: Free T4: 1.15 ng/dL (ref 0.82–1.77)

## 2018-02-10 LAB — VITAMIN D 25 HYDROXY (VIT D DEFICIENCY, FRACTURES): Vit D, 25-Hydroxy: 70.7 ng/mL (ref 30.0–100.0)

## 2018-02-23 DIAGNOSIS — M7061 Trochanteric bursitis, right hip: Secondary | ICD-10-CM | POA: Diagnosis not present

## 2018-02-23 DIAGNOSIS — M7062 Trochanteric bursitis, left hip: Secondary | ICD-10-CM | POA: Diagnosis not present

## 2018-02-28 ENCOUNTER — Other Ambulatory Visit: Payer: Self-pay | Admitting: Family Medicine

## 2018-02-28 DIAGNOSIS — E559 Vitamin D deficiency, unspecified: Secondary | ICD-10-CM

## 2018-03-04 ENCOUNTER — Other Ambulatory Visit: Payer: Self-pay

## 2018-03-04 DIAGNOSIS — F900 Attention-deficit hyperactivity disorder, predominantly inattentive type: Secondary | ICD-10-CM

## 2018-03-04 NOTE — Telephone Encounter (Signed)
Denied.  This request is way to early.  Per pt's med records in his chart, he was written for a 90 d supply end of Jan, 2020.

## 2018-03-04 NOTE — Telephone Encounter (Signed)
Pharmacy sent over request for refill on Adderall.  Insurance would only allow for 30 days a time and remainder had to voided. Medication was last filled on 02/02/18 and LOV 02/08/18.  Please review and advise. MPulliam, CMA/RT(R)

## 2018-03-06 DIAGNOSIS — S0501XA Injury of conjunctiva and corneal abrasion without foreign body, right eye, initial encounter: Secondary | ICD-10-CM | POA: Diagnosis not present

## 2018-03-06 DIAGNOSIS — H10013 Acute follicular conjunctivitis, bilateral: Secondary | ICD-10-CM | POA: Diagnosis not present

## 2018-03-07 ENCOUNTER — Other Ambulatory Visit: Payer: Self-pay | Admitting: Family Medicine

## 2018-03-07 DIAGNOSIS — F900 Attention-deficit hyperactivity disorder, predominantly inattentive type: Secondary | ICD-10-CM

## 2018-03-07 NOTE — Telephone Encounter (Signed)
Pharmacy sent over request for refill on Adderall.  Insurance would only allow for 30 days a time and remainder had to voided. Medication was last filled on 02/02/18 and therefore would be due for refill on 03/05/18.  LOV 02/08/18.  Please review and advise. MPulliam, CMA/RT(R)

## 2018-03-08 ENCOUNTER — Telehealth: Payer: Self-pay | Admitting: Family Medicine

## 2018-03-08 DIAGNOSIS — F900 Attention-deficit hyperactivity disorder, predominantly inattentive type: Secondary | ICD-10-CM

## 2018-03-08 MED ORDER — AMPHETAMINE-DEXTROAMPHETAMINE 10 MG PO TABS: 10.0000 mg | ORAL_TABLET | Freq: Two times a day (BID) | ORAL | 0 refills | Status: DC

## 2018-03-08 NOTE — Telephone Encounter (Signed)
Pt's wife states pharmacy advised them provider would have to authorize new Rx-- Ins  Co will only allow 30dys supply to be distributed & they were only given 30tabs--- Rx was written for 180 tablets.  --Advised spouse provider is sick & out of office since Monday & should be returning Wed but would forward (2nd) message to medical assistant.  --Dion Body

## 2018-03-08 NOTE — Telephone Encounter (Signed)
Verified with the patient that he has been taking 1/2 tablet bid due to pharmacy having to give 20 mg tabs because they were out of the 10 mg tabs.  Patient aware that refill approval will have to wait until Dr. Raliegh Scarlet in in the office. MPulliam, CMA/RT(R)

## 2018-03-08 NOTE — Telephone Encounter (Signed)
I printed these in clinic.  Please collect them and I can sign

## 2018-03-08 NOTE — Telephone Encounter (Signed)
Medication last filled on 02/02/2018 for 90 day supply.  However due to insurance pharmacy was only able to fill for 30 day supply and remainder had to be voided - verified by the pharmacy.  [place] controlled substance database reviewed and no aberrancies noted. Please review and advise. MPulliam, CMA/RT(R)

## 2018-03-09 MED ORDER — AMPHETAMINE-DEXTROAMPHETAMINE 10 MG PO TABS: 10.0000 mg | ORAL_TABLET | Freq: Two times a day (BID) | ORAL | 0 refills | Status: DC

## 2018-03-10 ENCOUNTER — Telehealth: Payer: Self-pay | Admitting: Family Medicine

## 2018-03-10 ENCOUNTER — Other Ambulatory Visit: Payer: Self-pay

## 2018-03-10 DIAGNOSIS — F900 Attention-deficit hyperactivity disorder, predominantly inattentive type: Secondary | ICD-10-CM

## 2018-03-10 NOTE — Telephone Encounter (Signed)
The printed RXs for Adderrall had the incorrect fill date on them.  I reviewed and informed the pharmacy to void out the prescriptions and we would send electrically.  I have meds pended with fill dates or 03/10/2018 and 04/10/2018. MPulliam, CMA/RT(R)

## 2018-03-10 NOTE — Telephone Encounter (Signed)
Called the fill date was incorrect and pharmacy voided out the RXs and requested new RXs to be sent.  Asked for them to fax written conformation that RXs were voided out. MPulliam, CMA/RT(R)

## 2018-03-10 NOTE — Telephone Encounter (Signed)
[name] from [facility] needs order clarification/refill dates on a prescription (she did not detail which med in the VM). Please contact her back at [phone]

## 2018-03-10 NOTE — Telephone Encounter (Signed)
Need written confirmation from pharmacy that those scripts were destroyed.  Can't fill until we get that

## 2018-03-11 DIAGNOSIS — H10013 Acute follicular conjunctivitis, bilateral: Secondary | ICD-10-CM | POA: Diagnosis not present

## 2018-03-11 MED ORDER — AMPHETAMINE-DEXTROAMPHETAMINE 10 MG PO TABS: 10.0000 mg | ORAL_TABLET | Freq: Two times a day (BID) | ORAL | 0 refills | Status: DC

## 2018-03-11 NOTE — Telephone Encounter (Signed)
We have received conformation from the pharmacy that the prescriptions have been voided. [place] database report printed and given to Dr. Raliegh Scarlet. MPulliam, CMA/RT(R)

## 2018-03-23 ENCOUNTER — Other Ambulatory Visit: Payer: Self-pay

## 2018-03-23 ENCOUNTER — Other Ambulatory Visit: Payer: PPO

## 2018-03-23 DIAGNOSIS — E1159 Type 2 diabetes mellitus with other circulatory complications: Secondary | ICD-10-CM

## 2018-03-23 DIAGNOSIS — Z79899 Other long term (current) drug therapy: Secondary | ICD-10-CM | POA: Diagnosis not present

## 2018-03-23 DIAGNOSIS — I10 Essential (primary) hypertension: Secondary | ICD-10-CM | POA: Diagnosis not present

## 2018-03-23 DIAGNOSIS — N183 Chronic kidney disease, stage 3 unspecified: Secondary | ICD-10-CM

## 2018-03-23 DIAGNOSIS — E1169 Type 2 diabetes mellitus with other specified complication: Secondary | ICD-10-CM | POA: Diagnosis not present

## 2018-03-23 DIAGNOSIS — I152 Hypertension secondary to endocrine disorders: Secondary | ICD-10-CM

## 2018-03-23 NOTE — Addendum Note (Signed)
Addended by: Lanier Prude D on: 03/23/2018 08:55 AM   Modules accepted: Orders

## 2018-03-24 LAB — BASIC METABOLIC PANEL
BUN/Creatinine Ratio: 16 (ref 10–24)
BUN: 30 mg/dL — ABNORMAL HIGH (ref 8–27)
CO2: 25 mmol/L (ref 20–29)
Calcium: 9.4 mg/dL (ref 8.6–10.2)
Chloride: 102 mmol/L (ref 96–106)
Creatinine, Ser: 1.88 mg/dL — ABNORMAL HIGH (ref 0.76–1.27)
GFR calc Af Amer: 43 mL/min/{1.73_m2} — ABNORMAL LOW (ref >59)
GFR calc non Af Amer: 37 mL/min/{1.73_m2} — ABNORMAL LOW (ref >59)
Glucose: 112 mg/dL — ABNORMAL HIGH (ref 65–99)
Potassium: 5.5 mmol/L — ABNORMAL HIGH (ref 3.5–5.2)
Sodium: 139 mmol/L (ref 134–144)

## 2018-04-04 ENCOUNTER — Other Ambulatory Visit: Payer: Self-pay | Admitting: Family Medicine

## 2018-04-05 DIAGNOSIS — M7061 Trochanteric bursitis, right hip: Secondary | ICD-10-CM | POA: Diagnosis not present

## 2018-04-05 DIAGNOSIS — M7062 Trochanteric bursitis, left hip: Secondary | ICD-10-CM | POA: Diagnosis not present

## 2018-04-14 DIAGNOSIS — H3552 Pigmentary retinal dystrophy: Secondary | ICD-10-CM | POA: Diagnosis not present

## 2018-04-14 DIAGNOSIS — H401130 Primary open-angle glaucoma, bilateral, stage unspecified: Secondary | ICD-10-CM | POA: Diagnosis not present

## 2018-04-14 DIAGNOSIS — E1122 Type 2 diabetes mellitus with diabetic chronic kidney disease: Secondary | ICD-10-CM | POA: Diagnosis not present

## 2018-04-14 DIAGNOSIS — I129 Hypertensive chronic kidney disease with stage 1 through stage 4 chronic kidney disease, or unspecified chronic kidney disease: Secondary | ICD-10-CM | POA: Diagnosis not present

## 2018-04-14 DIAGNOSIS — H353111 Nonexudative age-related macular degeneration, right eye, early dry stage: Secondary | ICD-10-CM | POA: Diagnosis not present

## 2018-04-14 DIAGNOSIS — N183 Chronic kidney disease, stage 3 (moderate): Secondary | ICD-10-CM | POA: Diagnosis not present

## 2018-04-14 DIAGNOSIS — H353122 Nonexudative age-related macular degeneration, left eye, intermediate dry stage: Secondary | ICD-10-CM | POA: Diagnosis not present

## 2018-04-14 DIAGNOSIS — M109 Gout, unspecified: Secondary | ICD-10-CM | POA: Diagnosis not present

## 2018-04-18 ENCOUNTER — Other Ambulatory Visit: Payer: Self-pay | Admitting: Family Medicine

## 2018-05-02 ENCOUNTER — Other Ambulatory Visit: Payer: Self-pay | Admitting: Family Medicine

## 2018-05-05 ENCOUNTER — Other Ambulatory Visit: Payer: Self-pay

## 2018-05-05 ENCOUNTER — Telehealth: Payer: Self-pay | Admitting: Family Medicine

## 2018-05-05 ENCOUNTER — Other Ambulatory Visit: Payer: Self-pay | Admitting: Family Medicine

## 2018-05-05 NOTE — Telephone Encounter (Signed)
Sent to Dr. Raliegh Scarlet to review.  Medication last filled by a pervious provider. MPulliam, CMA/RT(R)

## 2018-05-05 NOTE — Telephone Encounter (Signed)
Patient's wife called stating that they thought that Dr. Jenetta Downer was going to order a refill of his gabapentin, but the called their pharm and no order has been placed. Please advise if this is something we are to refill and if so please send to [facility].

## 2018-05-05 NOTE — Telephone Encounter (Signed)
Patient requesting a refill on Gabapentin, medication last filled by pervious provider lov 02/08/2018. Please review and advise. MPulliam, CMA/RT(R)

## 2018-05-06 MED ORDER — GABAPENTIN 800 MG PO TABS: 800.0000 mg | ORAL_TABLET | Freq: Three times a day (TID) | ORAL | 1 refills | Status: DC

## 2018-05-10 ENCOUNTER — Encounter: Payer: Self-pay | Admitting: Family Medicine

## 2018-05-10 ENCOUNTER — Other Ambulatory Visit: Payer: Self-pay

## 2018-05-10 ENCOUNTER — Ambulatory Visit (INDEPENDENT_AMBULATORY_CARE_PROVIDER_SITE_OTHER): Payer: PPO | Admitting: Family Medicine

## 2018-05-10 VITALS — Temp 96.8°F | Ht 71.0 in | Wt 210.0 lb

## 2018-05-10 DIAGNOSIS — E785 Hyperlipidemia, unspecified: Secondary | ICD-10-CM

## 2018-05-10 DIAGNOSIS — E1169 Type 2 diabetes mellitus with other specified complication: Secondary | ICD-10-CM

## 2018-05-10 DIAGNOSIS — E1159 Type 2 diabetes mellitus with other circulatory complications: Secondary | ICD-10-CM | POA: Diagnosis not present

## 2018-05-10 DIAGNOSIS — I10 Essential (primary) hypertension: Secondary | ICD-10-CM | POA: Diagnosis not present

## 2018-05-10 DIAGNOSIS — B354 Tinea corporis: Secondary | ICD-10-CM | POA: Diagnosis not present

## 2018-05-10 DIAGNOSIS — E1129 Type 2 diabetes mellitus with other diabetic kidney complication: Secondary | ICD-10-CM

## 2018-05-10 DIAGNOSIS — F39 Unspecified mood [affective] disorder: Secondary | ICD-10-CM | POA: Diagnosis not present

## 2018-05-10 DIAGNOSIS — I152 Hypertension secondary to endocrine disorders: Secondary | ICD-10-CM

## 2018-05-10 MED ORDER — KETOCONAZOLE 2 % EX CREA: 1.0000 "application " | TOPICAL_CREAM | Freq: Two times a day (BID) | CUTANEOUS | 1 refills | Status: DC

## 2018-05-10 MED ORDER — FLUCONAZOLE 150 MG PO TABS: 150.0000 mg | ORAL_TABLET | Freq: Once | ORAL | 0 refills | Status: AC

## 2018-05-10 NOTE — Progress Notes (Signed)
Virtual / live video office visit note for [employer], D.O- Primary Care Physician at [facility]   I connected with current patient today and beyond visually recognizing the correct individual, I verified that I am speaking with the correct person using two identifiers.  . Location of the patient: Home . Location of the provider: Office Only the patient (+/- their family members at pt's discretion) and myself were participating in the encounter    - This visit type was conducted due to national recommendations for restrictions regarding the COVID-19 Pandemic (e.g. social distancing) in an effort to limit this patient's exposure and mitigate transmission in our community.  This format is felt to be most appropriate for this patient at this time.   - The patient did have access to video technology today  - No physical exam could be performed with this format, beyond that communicated to [place] by the patient/ family members as noted.   - Additionally my office staff/ schedulers discussed with the patient that there may be a monetary charge related to this service, depending on patient's medical insurance.   The patient expressed understanding, and agreed to proceed.      History of Present Illness:  ---> Patient recently seen by Dr. Moshe Cipro his renal doctor on 04/14/2018.  She did not mention a contraindication to metformin with his GFR being above 40.  Noticed ringworm rash around beard area for about couple months now- W, but going on for a year. This time, appears cream not working like previous times.     HPI:  [name] y.o. male presents for routine chronic follow up for multiple medical problems.     1.  Mood:  - on prozac 47m; patient states he is doing well, states he is calm and a much better person on the medicine.  Denies sexual side effects or concerns    2.  Sleep:  - 1 tab 50 mg nightly; working well.  No complaints    3.  Tinea corporis:  -  worse again-- and OTC creams not working.  On his neck/ beard region.  He is shaving and it is spreading a little   1) Diabetes Type 2 Pt reports compliance with medications and/ or treatment plan Denies medication or current treatment plan S-E  Home fasting glucose readings range -  not checking 120-134;  2 hr PP-  not checking Denies polyuria/polydipsia. Denies hypoglycemia symptoms  Last diabetic eye exam was  Lab Results  Component Value Date   HMDIABEYEEXA No Retinopathy 10/05/2017    Lab Results  Component Value Date   HGBA1C 6.6 (A) 02/08/2018   HGBA1C 6.5 (A) 11/08/2017   HGBA1C 6.3 (H) 07/23/2017    Lab Results  Component Value Date   MICROALBUR 80 12/07/2016   LDLCALC 72 02/09/2018   CREATININE 1.88 (H) 03/23/2018     2) Hyperlipidemia  Pt reports compliance with meds and/or treatment plan such as low sat/trans fat and low cholesterol diet and routine exercise Denies Medication S-E  (RUQ pain; Muscle aches ) - no  Last lipid panel as follows:  Lab Results  Component Value Date   CHOL 128 02/09/2018   HDL 43 02/09/2018   LDLCALC 72 02/09/2018   TRIG 65 02/09/2018   CHOLHDL 3.0 02/09/2018    Lab Results  Component Value Date   ALT 22 02/09/2018     3) Hypertension -GFR on 03/23/2018 was 43.   Pt reports compliance with  medications and/ or treatment plan Denies medication S-E. Home Blood pressure range-have been running well per other specialty office visits recently but however, patient lost his blood pressure cuff at home for the time being since he is doing home renovations Low salt diet-   yes  Exercise-   nothing formal but is very active around the house. Smoking Status noted  Patient denies new onset of sx- no chest pain, dizziness, HA, DIB/ shortness of breath or swelling.  Lab Results  Component Value Date   CREATININE 1.88 (H) 03/23/2018    Last 3 blood pressure readings in our office are as follows: BP Readings from Last 3  Encounters:  02/08/18 124/87  11/08/17 134/78  08/30/17 108/71     4) Weight:   Pt trying to lose wt still.  Eating better.  Exercise--> remodeling house and working around house.     Wt Readings from Last 3 Encounters:  05/10/18 210 lb (95.3 kg)  02/08/18 218 lb 8 oz (99.1 kg)  11/08/17 215 lb 6.4 oz (97.7 kg)    BMI Readings from Last 3 Encounters:  05/10/18 29.29 kg/m  02/08/18 30.47 kg/m  11/08/17 30.04 kg/m       Note:  -Message sent to patient regarding lab results on 04/05/2018 per below:   Me  to Lanier Prude D, CMA  4:49 PM  Melissa, I tried calling pt about this and he has no vm SET UP ON HIS CELL PHONE.   - tell him that his renal fxn is not W than before ( months past).  But if his renal doc told him NOT to take metformin in past b/c he felt it poorly effected pt's kidneys, then yes, I would rec he not stay on it. ( please tell him I am sorry and I wish he had mentioned this to me about his renal doc during the ov. ) Trajenta- or linagliptin would be a good med that requires no renal dosing. Just confirm pt has not had pancreatitis or bladder Ca in past. Reassure pt that his renal fxn is not W than months ago- but just a little w than when we last checked.   Please check with him and confirm what he was told by renal doc as I DO NOT see any notes from renal doc the past c of yrs under media unless I am missing them.    thnx    Per UTD:  The exact degree of kidney, cardiac, and liver function required for the safe use of metformin remain uncertain [91]. Improved clinical outcomes with metformin have been reported in observational studies of patients with diabetes and heart failure [23-25], renal impairment (eGFR 45 to 60 mL/min/1.73 m2) [25-27], or chronic liver disease with hepatic impairment [25]. In one systematic review of 17 observational studies comparing regimens with and without metformin, metformin use was associated with lower all-cause  mortality among patients with heart failure, renal impairment, or chronic liver disease with hepatic impairment [25]. In addition, metformin use in patients with renal impairment or heart failure was associated with fewer heart failure readmissions. On the basis of these and other studies, the [place] Food and Drug Administration (FDA) revised its labeling of metformin, which previously had identified metformin as contraindicated in women and men with serum creatinine levels ?1.4 mg/dL (124 micromol/L) and ?1.5 mg/dL (133 micromol/L), respectively [28]. The use of metformin is contraindicated in patients with an eGFR <30 mL/min/1.73 m2, and the initiation of metformin is not recommended in  patients with an eGFR between 30 and 45 mL/min/1.73 m2. For patients taking metformin whose eGFR falls below 45 mL/min/1.73 m2, the benefits and risk of continuing treatment should be assessed, whereas metformin should be discontinued if the eGFR falls below 30 mL/min/1.73 m2.   Impression and Recommendations:    1. 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 Hypertension associated with diabetes ([place])   3. Hyperlipidemia associated with type 2 diabetes mellitus ([place])   4. Mood disorder ([place])   5. Tinea corporis- mainly neck/ beard region    -A1c under good control couple months ago was 6.6.  Historically has been 6.3, 6.5 and very well controlled.  Due to COVID we will forego bring him in right now and recheck in 3 months along with a CMP.  -Encouraged patient to check his blood pressure at home.  Per all office visit notes with other providers, has been under good control last 6 months or so.  Encouraged regular exercise of at least 30 minutes moderate intensity aerobics as well as low-salt diet  -Continue cholesterol statin medication.  Importance of proper hydration and exercise reviewed with patient.  -Chronic kidney disease with GFR 40+.  Reviewed  nephrology's notes from 04/14/2018.  Metformin is not a contraindication at this time.  We will continue to monitor GFR and serum creatinine closely.  If drops 30 or less he will need to come off the metformin.  Patient was specifically told this today and this was discussed with him. -Continue ACE inhibitor and continue treatment per nephrology  -For tinea infection, patient was referred to dermatology back 11/24/2017 by [place] and patient never went.  He states in the past dermatologist had to give him an oral pill which she took for a couple of days in addition to topical antifungals which finally made it go away. -Extensive counseling regarding staying dry, avoiding shaving the area to spread rash etc. -Risk benefits of oral medicines as well as topical discussed with patient.  He will let me know if no improvement after a couple of weeks  - As part of my medical decision making, I reviewed the following data within the [place] History obtained from pt /family, CMA notes reviewed and incorporated if applicable, Labs reviewed, Radiograph/ tests reviewed if applicable and OV notes from prior OV's with me, as well as other specialists she/he has seen since seeing me last, were all reviewed and used in my medical decision making process today.   - Additionally, discussion had with patient regarding txmnt plan, their biases about that plan etc were used in my medical decision making today.   - The patient agreed with the plan and demonstrated an understanding of the instructions.   No barriers to understanding were identified.   - Red flag symptoms and signs discussed in detail.  Patient expressed understanding regarding what to do in case of emergency\ urgent symptoms.  The patient was advised to call back or seek an in-person evaluation if the symptoms worsen or if the condition fails to improve as anticipated.   Return pt told to call for 63mof/up appt, for 361mofor CMP, A1c, Urine  Microalb if needed and OV 1 wk later.    Meds ordered this encounter  Medications  . fluconazole (DIFLUCAN) 150 MG tablet    Sig: Take 1 tablet (150 mg total) by mouth once for 1 dose.    Dispense:  7 tablet  Refill:  0  . ketoconazole (NIZORAL) 2 % cream    Sig: Apply 1 application topically 2 (two) times daily. To affected areas.    Dispense:  60 g    Refill:  1    Medications Discontinued During This Encounter  Medication Reason  . amphetamine-dextroamphetamine (ADDERALL) 10 MG tablet Duplicate      I provided 28 minutes of non-face-to-face time during this encounter,with over 50% of the time in direct counseling on patients medical conditions/ medical concerns.  Additional time was spent with charting and coordination of care after the actual visit commenced.   Note:  This note was prepared with assistance of Dragon voice recognition software. Occasional wrong-word or sound-a-like substitutions may have occurred due to the inherent limitations of voice recognition software.  Mellody Dance, DO     Patient Care Team    Relationship Specialty Notifications Start End  Mellody Dance, DO PCP - General Family Medicine  10/28/16   Rutherford Guys, MD Consulting Physician Ophthalmology  11/23/14   Jovita Gamma, MD Consulting Physician Neurosurgery  11/23/14   Kathie Rhodes, MD Consulting Physician Urology  12/07/14   Milus Banister, MD Attending Physician Gastroenterology  12/07/14   Chesley Mires, MD Consulting Physician Pulmonary Disease  12/07/16   Garald Balding, MD Consulting Physician Orthopedic Surgery  12/07/16    Comment: back and hip  Dene Gentry, MD Consulting Physician Sports Medicine  12/07/16    Comment: back and hip pain  Corliss Parish, MD Consulting Physician Nephrology  11/08/17    Comment: Sees her every 6 months    -Vitals obtained; medications/ allergies reconciled;  personal medical, social, Sx etc.histories were updated by CMA,  reviewed by me and are reflected in chart  Patient Active Problem List   Diagnosis Date Noted  . Hypertension associated with diabetes ([place]) 12/01/2011    Priority: High  . Diabetes mellitus with renal manifestation ([place]) 06/11/2006    Priority: High  . Hyperlipidemia associated with type 2 diabetes mellitus ([place]) 05/26/2006    Priority: High  . OBSTRUCTIVE SLEEP APNEA 05/26/2006    Priority: High  . Attention deficit hyperactivity disorder (ADHD), predominantly inattentive type 07/26/2017    Priority: Medium  . Overweight (BMI 25.0-29.9) 04/25/2017    Priority: Medium  . Prostatitis 04/20/2014    Priority: Medium  . Mood disorder ([place]) 03/29/2013    Priority: Medium  . Renal insufficiency 12/01/2011    Priority: Medium  . 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 Vitamin D deficiency 11/30/2007    Priority: Low  . Diabetes mellitus ([place]) 02/08/2018  . Chronic kidney disease (CKD), stage III (moderate) ([place]) 02/08/2018  . Tinea corporis 11/08/2017  . Chronic left hip pain 07/26/2017  . Facet arthropathy, lumbosacral 07/26/2017  . DDD (degenerative disc disease), lumbosacral 07/26/2017  . 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 Noncompliance 03/24/2017  . Abdominal bloating 05/15/2015  . UTI (urinary tract infection) 04/20/2014  . Hyperglycemia 04/20/2014  . Fatigue 03/07/2014  . Family history of early CAD 03/07/2014  . Maxillary sinusitis, acute 12/06/2013  . Tinea versicolor 12/06/2013  . Physical exam 11/06/2013  . Other malaise and fatigue 03/29/2013  . Decreased libido 03/29/2013  . Hematuria 02/17/2013  . Right elbow pain 12/15/2012  . Pustular folliculitis 17/51/0258  . Left ankle pain 03/15/2012  . SPINAL STENOSIS, LUMBAR 08/02/2008  . GOUT 11/30/2007  . BPH (benign  prostatic hyperplasia) 11/30/2007  . ANEMIA NEC 06/11/2006  . Chronic back pain greater  than 3 months duration 06/11/2006     Current Meds  Medication Sig  . allopurinol (ZYLOPRIM) 300 MG tablet TAKE 1/2 TABLET BY MOUTH DAILY  . amphetamine-dextroamphetamine (ADDERALL) 10 MG tablet Take 1 tablet (10 mg total) by mouth 2 (two) times daily.  . Ascorbic Acid (VITAMIN C) 1000 MG tablet Take 1,000 mg by mouth daily.  Marland Kitchen aspirin 81 MG tablet Take 81 mg by mouth daily.    Marland Kitchen atorvastatin (LIPITOR) 40 MG tablet TAKE 1 TABLET BY MOUTH EVERY NIGHT AT BEDTIME  . benazepril (LOTENSIN) 20 MG tablet TAKE 1 TABLET BY MOUTH DAILY  . blood glucose meter kit and supplies Dispense based on patient and insurance preference. Use to check fasting blood glucose in the morning and to check glucose 2 hours after largest meal of the day. (FOR ICD-10 E10.9, E11.9).  . clotrimazole-betamethasone (LOTRISONE) cream Apply 1 application topically 2 (two) times daily.  Marland Kitchen FLUoxetine (PROZAC) 40 MG capsule TAKE 1 CAPSULE BY MOUTH DAILY  . fluticasone (FLONASE) 50 MCG/ACT nasal spray Place 2 sprays into both nostrils daily.  Marland Kitchen gabapentin (NEURONTIN) 800 MG tablet Take 1 tablet (800 mg total) by mouth 3 (three) times daily.  Marland Kitchen glucose blood test strip Use as instructed  . LANCETS ULTRA FINE MISC Use to check fasting blood glucose in the morning and to check glucose 2 hours after largest meal of the day  . Multiple Vitamins-Iron (MULTIVITAMINS WITH IRON) TABS Take 1 tablet by mouth daily.  Marland Kitchen MYRBETRIQ 25 MG TB24 tablet   . pantoprazole (PROTONIX) 40 MG tablet TAKE 1 TABLET BY MOUTH DAILY  . polyethylene glycol powder (GLYCOLAX/MIRALAX) powder USE AS DIRECTED  . pyridoxine (B-6) 100 MG tablet Take 100 mg by mouth daily.  . traZODone (DESYREL) 50 MG tablet TAKE 1/2 TO 1 TABLET BY MOUTH AT BEDTIMEAS NEEDED FOR SLEEP  . Vitamin D, Ergocalciferol, (DRISDOL) 1.25 MG (50000 UT) CAPS capsule TAKE 1 CAPSULE BY MOUTH EVERY 7 DAYS     Allergies  Allergen Reactions  . Prednisone Other (See Comments)    Caused blood sugar  to increase, pt will not take     ROS:  See above HPI for pertinent positives and negatives   Objective:   Temperature (!) 96.8 F (36 C), height '5\' 11"'  (1.803 m), weight 210 lb (95.3 kg).  (if some vitals are omitted, this means that patient was UNABLE to obtain them even though they were asked to get them prior to OV today.  They were asked to call us at their earliest convenience with these once obtained.)  General: A & O * 3; visually in no acute distress; in usual state of health.  Skin: Visible skin appears normal and pt's usual skin color; however on the right posterior lateral aspect of his neck he does have a raised annular lesion approximately half dollar in size. HEENT:  EOMI, head is normocephalic and atraumatic.  Sclera are anicteric. Neck has a good range of motion.  Lips are noncyanotic Chest: normal chest excursion and movement Respiratory: speaking in full sentences, no conversational dyspnea; no use of accessory muscles Psych: insight good, mood- appears full

## 2018-05-25 ENCOUNTER — Other Ambulatory Visit: Payer: Self-pay | Admitting: Family Medicine

## 2018-06-16 DIAGNOSIS — G4733 Obstructive sleep apnea (adult) (pediatric): Secondary | ICD-10-CM | POA: Diagnosis not present

## 2018-06-27 ENCOUNTER — Other Ambulatory Visit: Payer: Self-pay | Admitting: Family Medicine

## 2018-06-27 DIAGNOSIS — F900 Attention-deficit hyperactivity disorder, predominantly inattentive type: Secondary | ICD-10-CM

## 2018-06-28 MED ORDER — AMPHETAMINE-DEXTROAMPHETAMINE 10 MG PO TABS: 10.0000 mg | ORAL_TABLET | Freq: Two times a day (BID) | ORAL | 0 refills | Status: DC

## 2018-06-28 NOTE — Telephone Encounter (Signed)
Sorry I actually do see where we are giving these medicines to patient.  Please disregard my prior notes about the Adderall-not sure why it did not show up in the chart prior. -Patient will need follow-up office visit with me every 3 months specifically for  ADHD-since it is been over 3 months for this, okay to give him just 130-day supply but no more.  He will need office visit to discuss this further.

## 2018-06-28 NOTE — Addendum Note (Signed)
Addended by: Lanier Prude D on: 06/28/2018 05:28 PM   Modules accepted: Orders

## 2018-06-28 NOTE — Telephone Encounter (Signed)
If he was only given a 30-day supply of Adderall at office visit on 04/10/2018,  this is likely because there was a medication or dose change and patient required close monitoring and follow-up, and/ or patient was supposed to follow-up with me for another reason and did not.  Please see if this was the case

## 2018-06-28 NOTE — Telephone Encounter (Signed)
Also, I do not see anywhere in the chart in any my office visits where I assessed this condition and/or, in telephone visits etc.   Especially when it was prescribed around March and/or April 2020, I do not see any note stating I approved it, assessing why he was on it etc.    He will need an office visit with me to address the Adderall.  He is supposed be taking only 10 mg twice daily.- This was in a phone note.

## 2018-06-28 NOTE — Telephone Encounter (Signed)
Pended medication and sent to Dr. Raliegh Scarlet to be sent in electrically. MPulliam, CMA/RT(R)

## 2018-06-28 NOTE — Telephone Encounter (Signed)
Adderall last filled 04/10/2018 for 30 days.  LOV 05/10/2018.  Please review and advise. MPulliam, CMA/RT(R)

## 2018-07-05 ENCOUNTER — Other Ambulatory Visit: Payer: Self-pay | Admitting: Family Medicine

## 2018-07-26 DIAGNOSIS — M7062 Trochanteric bursitis, left hip: Secondary | ICD-10-CM | POA: Diagnosis not present

## 2018-07-26 DIAGNOSIS — N410 Acute prostatitis: Secondary | ICD-10-CM | POA: Diagnosis not present

## 2018-07-26 DIAGNOSIS — N401 Enlarged prostate with lower urinary tract symptoms: Secondary | ICD-10-CM | POA: Diagnosis not present

## 2018-08-01 ENCOUNTER — Other Ambulatory Visit: Payer: Self-pay | Admitting: Family Medicine

## 2018-08-01 DIAGNOSIS — F900 Attention-deficit hyperactivity disorder, predominantly inattentive type: Secondary | ICD-10-CM

## 2018-08-03 ENCOUNTER — Other Ambulatory Visit: Payer: Self-pay | Admitting: Family Medicine

## 2018-08-03 DIAGNOSIS — F900 Attention-deficit hyperactivity disorder, predominantly inattentive type: Secondary | ICD-10-CM

## 2018-08-08 ENCOUNTER — Telehealth: Payer: Self-pay | Admitting: Family Medicine

## 2018-08-08 ENCOUNTER — Other Ambulatory Visit: Payer: Self-pay | Admitting: Family Medicine

## 2018-08-08 DIAGNOSIS — F900 Attention-deficit hyperactivity disorder, predominantly inattentive type: Secondary | ICD-10-CM

## 2018-08-08 NOTE — Telephone Encounter (Signed)
Dr. Raliegh Scarlet denied - patient needs OV.  Called left message to call the office back. MPulliam, CMA/RT(R)

## 2018-08-08 NOTE — Telephone Encounter (Signed)
Patient is out of meds.  Made patient an appointment for tomorrow. MPulliam, CMA/RT(R)

## 2018-08-08 NOTE — Telephone Encounter (Signed)
Pt's wife /Darlene called requested refill on :   amphetamine-dextroamphetamine (ADDERALL) 10 MG tablet [[id number]]   Order Details Dose: 10 mg Route: Oral Frequency: 2 times daily  Dispense Quantity: 60 tablet Refills: 0 Fills remaining: --        Sig: Take 1 tablet (10 mg total) by mouth 2 (two) times daily.          Forwarding request to medical asst to send refill order to :  [place], [place]. [phone] (Phone) [phone] (Fax)   --Dion Body

## 2018-08-09 ENCOUNTER — Encounter: Payer: Self-pay | Admitting: Family Medicine

## 2018-08-09 ENCOUNTER — Other Ambulatory Visit: Payer: Self-pay

## 2018-08-09 ENCOUNTER — Ambulatory Visit (INDEPENDENT_AMBULATORY_CARE_PROVIDER_SITE_OTHER): Payer: PPO | Admitting: Family Medicine

## 2018-08-09 VITALS — BP 126/69 | Ht 71.0 in | Wt 210.0 lb

## 2018-08-09 DIAGNOSIS — E1122 Type 2 diabetes mellitus with diabetic chronic kidney disease: Secondary | ICD-10-CM

## 2018-08-09 DIAGNOSIS — N184 Chronic kidney disease, stage 4 (severe): Secondary | ICD-10-CM

## 2018-08-09 DIAGNOSIS — I152 Hypertension secondary to endocrine disorders: Secondary | ICD-10-CM

## 2018-08-09 DIAGNOSIS — E1121 Type 2 diabetes mellitus with diabetic nephropathy: Secondary | ICD-10-CM | POA: Diagnosis not present

## 2018-08-09 DIAGNOSIS — E1129 Type 2 diabetes mellitus with other diabetic kidney complication: Secondary | ICD-10-CM

## 2018-08-09 DIAGNOSIS — E1159 Type 2 diabetes mellitus with other circulatory complications: Secondary | ICD-10-CM

## 2018-08-09 DIAGNOSIS — I10 Essential (primary) hypertension: Secondary | ICD-10-CM | POA: Diagnosis not present

## 2018-08-09 DIAGNOSIS — F900 Attention-deficit hyperactivity disorder, predominantly inattentive type: Secondary | ICD-10-CM | POA: Diagnosis not present

## 2018-08-09 MED ORDER — AMPHETAMINE-DEXTROAMPHETAMINE 10 MG PO TABS: 10.0000 mg | ORAL_TABLET | Freq: Every day | ORAL | 0 refills | Status: DC

## 2018-08-09 NOTE — Progress Notes (Signed)
Virtual / live video office visit note for [employer], D.O- Primary Care Physician at [facility]   I connected with current patient today and beyond visually recognizing the correct individual, I verified that I am speaking with the correct person using two identifiers.  . Location of the patient: Home . Location of the provider: Office Only the patient (+/- their family members at pt's discretion) and myself were participating in the encounter    - This visit type was conducted due to national recommendations for restrictions regarding the COVID-19 Pandemic (e.g. social distancing) in an effort to limit this patient's exposure and mitigate transmission in our community.  This format is felt to be most appropriate for this patient at this time.   - The patient did have access to video technology today   - No physical exam could be performed with this format, beyond that communicated to [place] by the patient/ family members as noted.   - Additionally my office staff/ schedulers discussed with the patient that there may be a monetary charge related to this service, depending on patient's medical insurance.   The patient expressed understanding, and agreed to proceed.      History of Present Illness:  Confirms that he continues to follow up with nephrology.  Last saw nephrology for prostatitis two weeks ago.  Has a follow up appointment coming up.  ADHD Says he feels he is doing alright.  Patient notes that he's taking one tablet every morning.  He is not taking a tablet in the afternoon.  Says "I think I take half a tablet twice per day, but I just take the whole tablet in the morning."  DM HPI:  -  He has been working on diet and exercise for diabetes.  Believes his sugars have gone up a bit during [place].  Medication compliance - continues treatment plan as prescribed.  Home fasting glucose readings: 134, around 130's, sometimes lower around 119, "stuff like that." 2 hr  PP: not checking   Denies polyuria/polydipsia.  Denies hypo/ hyperglycemia symptoms  Last diabetic eye exam was  Lab Results  Component Value Date   HMDIABEYEEXA No Retinopathy 10/05/2017    Last A1C in the office was:  Lab Results  Component Value Date   HGBA1C 6.6 (A) 02/08/2018   HGBA1C 6.5 (A) 11/08/2017   HGBA1C 6.3 (H) 07/23/2017    Lab Results  Component Value Date   MICROALBUR 80 12/07/2016   LDLCALC 72 02/09/2018   CREATININE 1.88 (H) 03/23/2018    1. HTN HPI:  -  His blood pressure may or may not be controlled at home.  Pt is not checking it at home.   Says "it's been good, I can't remember what it was but it's been good."  Well controlled at other doctor's office visits.  Says "every time I go, they say it's good."  Reports "in the 120's over 69 or something."  - Patient reports good compliance with blood pressure medications  - Denies medication S-E   - Smoking Status noted   - He denies new onset of: chest pain, exercise intolerance, shortness of breath, dizziness, visual changes, headache, lower extremity swelling or claudication.   Last 3 blood pressure readings in our office are as follows: BP Readings from Last 3 Encounters:  08/09/18 126/69  02/08/18 124/87  11/08/17 134/78    Filed Weights   08/09/18 1408  Weight: 210 lb (95.3 kg)  Wt Readings from Last 3 Encounters:  08/09/18 210 lb (95.3 kg)  05/10/18 210 lb (95.3 kg)  02/08/18 218 lb 8 oz (99.1 kg)    BP Readings from Last 3 Encounters:  08/09/18 126/69  02/08/18 124/87  11/08/17 134/78     Impression and Recommendations:    1. 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 Attention deficit hyperactivity disorder (ADHD), predominantly inattentive type   3. Hypertension associated with diabetes ([place])   4. 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 - Has been taking his meds like this since last visit 05/10/2018. - Continue treatment plan as established. - Patient tolerating meds well and denies side-effects.  - Every three months, patient knows he needs to return for ADHD re-evaluation.  - Will continue to monitor.  Glomerular Disorder Associated with DM w/Stage 4 Kidney Disease 6 months ago, serum creatinine was 1.76. 5 months ago, serum creatinine was 1.88.  - Need for re-check of serum creatinine near future  - Continue to follow up with nephrology as recommended. - Per patient followed up two weeks ago with prostatitis. - Per patient, "they've got blood work scheduled coming up."  - Will continue to monitor.  Type 2 Diabetes Mellitus with Kidney Complication - Last W1X measured at 6.6. - Need for A1c re-check in near future.  - Counseled patient on pathophysiology of disease and discussed various treatment options, which often includes dietary and lifestyle modifications as first line.  Importance of low carb/ketogenic diet discussed with patient in addition to regular exercise.   - Check FBS and 2 hours after the biggest meal of your day.  Keep log and bring in next OV for my review.   Also, if you ever feel poorly, please check your blood pressure and blood sugar, as one or the other could be the cause of your symptoms.  - Being a diabetic, you need yearly eye and foot exams. Make appt.for diabetic eye exam.  - Will continue to monitor.  Hypertension Associated with Diabetes - At recent doctor's office visit, pt reports blood pressure was 126/69. - Continue management as established.  See med list.  - Ongoing lifestyle changes such as prudent diet and regular exercise program discussed with patient.   - Ambulatory BP monitoring STRONGLY encouraged. Keep log and bring in next OV. - Patient has not been checking blood pressure at home.  - Will continue  to monitor.  - Health Counseling was provided and all questions were answered.  - Patient will return in the near future at his convenience for fasting lab work. - Otherwise, continue to follow-up for chronic health maintenance appointments.  - As part of my medical decision making, I reviewed the following data within the [place] History obtained from pt /family, CMA notes reviewed and incorporated if applicable, Labs reviewed, Radiograph/ tests reviewed if applicable and OV notes from prior OV's with me, as well as other specialists she/he has seen since seeing me last, were all reviewed and used in my medical decision making process today.   - Additionally, discussion had with patient regarding txmnt plan, their biases about that plan etc were used in my medical decision making today.   - The patient agreed with the plan and demonstrated an understanding of the instructions.   No  barriers to understanding were identified.   - Red flag symptoms and signs discussed in detail.  Patient expressed understanding regarding what to do in case of emergency_0 urgent symptoms.  The patient was advised to call back or seek an in-person evaluation if the symptoms worsen or if the condition fails to improve as anticipated.   Return for  f/up near future for non-Fast labs-orders placed; then q 3 mo- DM, ADHD etc.    Orders Placed This Encounter  Procedures  . Comprehensive metabolic panel  . Hemoglobin A1c  . Magnesium  . Phosphorus    Meds ordered this encounter  Medications  . amphetamine-dextroamphetamine (ADDERALL) 10 MG tablet    Sig: Take 1 tablet (10 mg total) by mouth daily with breakfast.    Dispense:  30 tablet    Refill:  0  . amphetamine-dextroamphetamine (ADDERALL) 10 MG tablet    Sig: Take 1 tablet (10 mg total) by mouth daily with breakfast.    Dispense:  30 tablet    Refill:  0  . amphetamine-dextroamphetamine (ADDERALL) 10 MG tablet    Sig: Take 1 tablet (10  mg total) by mouth daily with breakfast.    Dispense:  30 tablet    Refill:  0    Medications Discontinued During This Encounter  Medication Reason  . amphetamine-dextroamphetamine (ADDERALL) 10 MG tablet Reorder      I provided  minutes of non-face-to-face time during this encounter,with over 50% of the time in direct counseling on patients medical conditions/ medical concerns.  Additional time was spent with charting and coordination of care after the actual visit commenced.   Note:  This note was prepared with assistance of Dragon voice recognition software. Occasional wrong-word or sound-a-like substitutions may have occurred due to the inherent limitations of voice recognition software.  This document serves as a record of services personally performed by Mellody Dance, DO. It was created on her behalf by [name], a trained medical scribe. The creation of this record is based on the scribe's personal observations and the provider's statements to them.   I have reviewed the above medical documentation for accuracy and completeness and I concur.  Mellody Dance, DO 08/10/2018 1:04 PM        Patient Care Team    Relationship Specialty Notifications Start End  Mellody Dance, DO PCP - General Family Medicine  10/28/16   Rutherford Guys, MD Consulting Physician Ophthalmology  11/23/14   Jovita Gamma, MD Consulting Physician Neurosurgery  11/23/14   Kathie Rhodes, MD Consulting Physician Urology  12/07/14   Milus Banister, MD Attending Physician Gastroenterology  12/07/14   Chesley Mires, MD Consulting Physician Pulmonary Disease  12/07/16   Garald Balding, MD Consulting Physician Orthopedic Surgery  12/07/16    Comment: back and hip  Dene Gentry, MD Consulting Physician Sports Medicine  12/07/16    Comment: back and hip pain  Corliss Parish, MD Consulting Physician Nephrology  11/08/17    Comment: Sees her every 6 months    -Vitals obtained;  medications/ allergies reconciled;  personal medical, social, Sx etc.histories were updated by CMA, reviewed by me and are reflected in chart  Patient Active Problem List   Diagnosis Date Noted  . Hypertension associated with diabetes ([place]) 12/01/2011    Priority: High  . Diabetes mellitus with renal manifestation ([place]) 06/11/2006    Priority: High  . Hyperlipidemia associated with type 2 diabetes mellitus ([place]) 05/26/2006    Priority: High  . OBSTRUCTIVE SLEEP  APNEA 05/26/2006    Priority: High  . Attention deficit hyperactivity disorder (ADHD), predominantly inattentive type 07/26/2017    Priority: Medium  . Overweight (BMI 25.0-29.9) 04/25/2017    Priority: Medium  . Prostatitis 04/20/2014    Priority: Medium  . Mood disorder ([place]) 03/29/2013    Priority: Medium  . Renal insufficiency 12/01/2011    Priority: Medium  . 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 Vitamin D deficiency 11/30/2007    Priority: Low  . Type 2 diabetes mellitus with renal complication ([place]) 23/30/0762  . Diabetes mellitus ([place]) 02/08/2018  . Chronic kidney disease (CKD), stage III (moderate) ([place]) 02/08/2018  . Tinea corporis 11/08/2017  . Chronic left hip pain 07/26/2017  . Facet arthropathy, lumbosacral 07/26/2017  . DDD (degenerative disc disease), lumbosacral 07/26/2017  . 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 Noncompliance 03/24/2017  . Abdominal bloating 05/15/2015  . UTI (urinary tract infection) 04/20/2014  . Hyperglycemia 04/20/2014  . Fatigue 03/07/2014  . Family history of early CAD 03/07/2014  . Maxillary sinusitis, acute 12/06/2013  . Tinea versicolor 12/06/2013  . Physical exam 11/06/2013  . Other malaise and fatigue 03/29/2013  . Decreased libido 03/29/2013  . Hematuria 02/17/2013  . Right elbow pain 12/15/2012  . Pustular folliculitis 26/33/3545  . Left ankle pain  03/15/2012  . SPINAL STENOSIS, LUMBAR 08/02/2008  . GOUT 11/30/2007  . BPH (benign prostatic hyperplasia) 11/30/2007  . ANEMIA NEC 06/11/2006  . Chronic back pain greater than 3 months duration 06/11/2006     Current Meds  Medication Sig  . allopurinol (ZYLOPRIM) 300 MG tablet TAKE 1/2 TABLET BY MOUTH DAILY  . amphetamine-dextroamphetamine (ADDERALL) 10 MG tablet Take 1 tablet (10 mg total) by mouth daily with breakfast.  . Ascorbic Acid (VITAMIN C) 1000 MG tablet Take 1,000 mg by mouth daily.  Marland Kitchen aspirin 81 MG tablet Take 81 mg by mouth daily.    Marland Kitchen atorvastatin (LIPITOR) 40 MG tablet TAKE 1 TABLET BY MOUTH EVERY NIGHT AT BEDTIME  . benazepril (LOTENSIN) 20 MG tablet TAKE 1 TABLET BY MOUTH DAILY  . blood glucose meter kit and supplies Dispense based on patient and insurance preference. Use to check fasting blood glucose in the morning and to check glucose 2 hours after largest meal of the day. (FOR ICD-10 E10.9, E11.9).  . clotrimazole-betamethasone (LOTRISONE) cream Apply 1 application topically 2 (two) times daily.  Marland Kitchen FLUoxetine (PROZAC) 40 MG capsule TAKE 1 CAPSULE BY MOUTH DAILY  . fluticasone (FLONASE) 50 MCG/ACT nasal spray Place 2 sprays into both nostrils daily.  Marland Kitchen gabapentin (NEURONTIN) 800 MG tablet Take 1 tablet (800 mg total) by mouth 3 (three) times daily.  Marland Kitchen glucose blood test strip Use as instructed  . ketoconazole (NIZORAL) 2 % cream Apply 1 application topically 2 (two) times daily. To affected areas.  Marland Kitchen LANCETS ULTRA FINE MISC Use to check fasting blood glucose in the morning and to check glucose 2 hours after largest meal of the day  . Multiple Vitamins-Iron (MULTIVITAMINS WITH IRON) TABS Take 1 tablet by mouth daily.  Marland Kitchen MYRBETRIQ 25 MG TB24 tablet   . pantoprazole (PROTONIX) 40 MG tablet TAKE 1 TABLET BY MOUTH DAILY  . polyethylene glycol powder (GLYCOLAX/MIRALAX) powder USE AS DIRECTED  . pyridoxine (B-6) 100 MG tablet Take 100 mg by mouth daily.  Marland Kitchen  sulfamethoxazole-trimethoprim (BACTRIM DS) 800-160 MG tablet Take 1 tablet by mouth 2 (  two) times daily.  . traZODone (DESYREL) 50 MG tablet TAKE 1/2 TO 1 TABLET BY MOUTH AT BEDTIMEAS NEEDED FOR SLEEP  . Vitamin D, Ergocalciferol, (DRISDOL) 1.25 MG (50000 UT) CAPS capsule TAKE 1 CAPSULE BY MOUTH EVERY 7 DAYS  . [DISCONTINUED] amphetamine-dextroamphetamine (ADDERALL) 10 MG tablet Take 1 tablet (10 mg total) by mouth 2 (two) times daily.     Allergies  Allergen Reactions  . Prednisone Other (See Comments)    Caused blood sugar to increase, pt will not take     ROS:  See above HPI for pertinent positives and negatives   Objective:   Blood pressure 126/69, height 5' 11" (1.803 m), weight 210 lb (95.3 kg).  (if some vitals are omitted, this means that patient was UNABLE to obtain them even though they were asked to get them prior to OV today.  They were asked to call us at their earliest convenience with these once obtained.)  General: A & O * 3; visually in no acute distress; in usual state of health.  Skin: Visible skin appears normal and pt's usual skin color HEENT:  EOMI, head is normocephalic and atraumatic.  Sclera are anicteric. Neck has a good range of motion.  Lips are noncyanotic Chest: normal chest excursion and movement Respiratory: speaking in full sentences, no conversational dyspnea; no use of accessory muscles Psych: insight good, mood- appears full

## 2018-08-15 ENCOUNTER — Other Ambulatory Visit: Payer: Self-pay | Admitting: Family Medicine

## 2018-08-15 DIAGNOSIS — E1159 Type 2 diabetes mellitus with other circulatory complications: Secondary | ICD-10-CM

## 2018-08-15 DIAGNOSIS — I152 Hypertension secondary to endocrine disorders: Secondary | ICD-10-CM

## 2018-08-15 DIAGNOSIS — E1169 Type 2 diabetes mellitus with other specified complication: Secondary | ICD-10-CM

## 2018-08-23 DIAGNOSIS — M7062 Trochanteric bursitis, left hip: Secondary | ICD-10-CM | POA: Diagnosis not present

## 2018-08-23 DIAGNOSIS — M47816 Spondylosis without myelopathy or radiculopathy, lumbar region: Secondary | ICD-10-CM | POA: Diagnosis not present

## 2018-08-23 DIAGNOSIS — M545 Low back pain: Secondary | ICD-10-CM | POA: Diagnosis not present

## 2018-08-23 DIAGNOSIS — G8929 Other chronic pain: Secondary | ICD-10-CM | POA: Diagnosis not present

## 2018-08-29 ENCOUNTER — Encounter: Payer: Self-pay | Admitting: Family Medicine

## 2018-08-29 ENCOUNTER — Ambulatory Visit (INDEPENDENT_AMBULATORY_CARE_PROVIDER_SITE_OTHER): Payer: PPO | Admitting: Family Medicine

## 2018-08-29 ENCOUNTER — Other Ambulatory Visit: Payer: Self-pay

## 2018-08-29 VITALS — BP 122/69 | HR 75 | Temp 95.3°F | Ht 71.0 in | Wt 213.0 lb

## 2018-08-29 DIAGNOSIS — B354 Tinea corporis: Secondary | ICD-10-CM

## 2018-08-29 MED ORDER — KETOCONAZOLE 2 % EX CREA: 1.0000 "application " | TOPICAL_CREAM | Freq: Two times a day (BID) | CUTANEOUS | 1 refills | Status: DC

## 2018-08-29 MED ORDER — FLUCONAZOLE 200 MG PO TABS: ORAL_TABLET | ORAL | 1 refills | Status: DC

## 2018-08-29 NOTE — Progress Notes (Signed)
Virtual / live video office visit note for [employer], D.O- Primary Care Physician at [facility]   I connected with current patient today and beyond visually recognizing the correct individual, I verified that I am speaking with the correct person using two identifiers.  . Location of the patient: Home . Location of the provider: Office Only the patient (+/- their family members at pt's discretion) and myself were participating in the encounter    - This visit type was conducted due to national recommendations for restrictions regarding the COVID-19 Pandemic (e.g. social distancing) in an effort to limit this patient's exposure and mitigate transmission in our community.  This format is felt to be most appropriate for this patient at this time.   - The patient did have access to video technology today  - No physical exam could be performed with this format, beyond that communicated to [place] by the patient/ family members as noted.   - Additionally my office staff/ schedulers discussed with the patient that there may be a monetary charge related to this service, depending on patient's medical insurance.   The patient expressed understanding, and agreed to proceed.      History of Present Illness:  Notes he is doing alright today.  Says in the past he had a rash on the back of his head, neck, and shoulders, which has now been coming back.  "During that time, you gave me that seven day pill and cream to eliminate it, which it did."  Says the cream soothes the itching and starts drying it up, and the pill "eliminated it from the inside."  States when he last had to go to the doctor for antibiotics for prostatitis, "I think during that process, it cleared everything out."  Confirms that on 05/10/2018, Fluconazole was prescribed along with Nizoral cream, prior to this past May.  It has been a couple of months since his last bout of prostatitis.  States he was treated on doxycycline for 21  days.  Noticed his skin concerns start coming back "after I finished all that."  Patient states he sweats and works every day, but does not go to the gym.  Says "my weight is staying down and I work every day and stay sweaty."  He has been remodeling bathrooms "and stuff like that."  States "I usually have to change shirts three times."   Impression and Recommendations:    1. Tinea corporis     Tinea Corporis - Last seen for this issue prior to May of 2020. - On 05/10/2018, Fluconazole prescribed along with Nizoral cream. - Per patient, this treatment worked well in the past. - Patient asked to send pictures of rash today.  - Asked patient to provide records from Dr. Kathie Rhodes regarding his latest antibiotics for prostatitis. - Per patient, it has been a couple of months since his last bout of antibiotics. - Per patient, he was treated on doxycycline for 21 days.  - Discussed that when we take antibiotics, we also clear out the normal bacteria of our skin. - Meanwhile, when it's warm and humid out and we are sweating, yeast may overgrow on the body - Advised patient to remove sweaty t-shirt from his skin immediately after exercising. - Advised patient to keep areas of sx clean and dry.  Recommendations - Need to return in near future for non-fasting blood work. - Will draw A1c, kidney function, magnesium, phosphorous.  - As part of my  medical decision making, I reviewed the following data within the electronic MEDICAL RECORD NUMBER History obtained from pt /family, CMA notes reviewed and incorporated if applicable, Labs reviewed, Radiograph/ tests reviewed if applicable and OV notes from prior OV's with me, as well as other specialists she/he has seen since seeing me last, were all reviewed and used in my medical decision making process today.   - Additionally, discussion had with patient regarding txmnt plan, their biases about that plan etc were used in my medical decision making today.    - The patient agreed with the plan and demonstrated an understanding of the instructions.   No barriers to understanding were identified.   - Red flag symptoms and signs discussed in detail.  Patient expressed understanding regarding what to do in case of emergency\ urgent symptoms.  The patient was advised to call back or seek an in-person evaluation if the symptoms worsen or if the condition fails to improve as anticipated.   Return for Near future for blood work otherwise around 11/09/2018 for DM, ADHD etc.   Meds ordered this encounter  Medications  . ketoconazole (NIZORAL) 2 % cream    Sig: Apply 1 application topically 2 (two) times daily. To affected areas.    Dispense:  60 g    Refill:  1  . fluconazole (DIFLUCAN) 200 MG tablet    Sig: 1 tab po each wk times 3 wks    Dispense:  3 tablet    Refill:  1    Medications Discontinued During This Encounter  Medication Reason  . ketoconazole (NIZORAL) 2 % cream Reorder    I provided 10 minutes of non-face-to-face time during this encounter,with over 50% of the time in direct counseling on patients medical conditions/ medical concerns.  Additional time was spent with charting and coordination of care after the actual visit commenced.   Note:  This note was prepared with assistance of Dragon voice recognition software. Occasional wrong-word or sound-a-like substitutions may have occurred due to the inherent limitations of voice recognition software.  This document serves as a record of services personally performed by Mellody Dance, DO. It was created on her behalf by [name], a trained medical scribe. The creation of this record is based on the scribe's personal observations and the provider's statements to them.   I have reviewed the above medical documentation for accuracy and completeness and I concur.  Mellody Dance, DO 08/29/2018 2:39 PM        Patient Care Team    Relationship Specialty Notifications Start  End  Mellody Dance, DO PCP - General Family Medicine  10/28/16   Rutherford Guys, MD Consulting Physician Ophthalmology  11/23/14   Jovita Gamma, MD Consulting Physician Neurosurgery  11/23/14   Kathie Rhodes, MD Consulting Physician Urology  12/07/14   Milus Banister, MD Attending Physician Gastroenterology  12/07/14   Chesley Mires, MD Consulting Physician Pulmonary Disease  12/07/16   Garald Balding, MD Consulting Physician Orthopedic Surgery  12/07/16    Comment: back and hip  Dene Gentry, MD Consulting Physician Sports Medicine  12/07/16    Comment: back and hip pain  Corliss Parish, MD Consulting Physician Nephrology  11/08/17    Comment: Sees her every 6 months    -Vitals obtained; medications/ allergies reconciled;  personal medical, social, Sx etc.histories were updated by CMA, reviewed by me and are reflected in chart  Patient Active Problem List   Diagnosis Date Noted  . Hypertension associated with diabetes (  [place]) 12/01/2011    Priority: High  . Diabetes mellitus with renal manifestation ([place]) 06/11/2006    Priority: High  . Hyperlipidemia associated with type 2 diabetes mellitus ([place]) 05/26/2006    Priority: High  . OBSTRUCTIVE SLEEP APNEA 05/26/2006    Priority: High  . Attention deficit hyperactivity disorder (ADHD), predominantly inattentive type 07/26/2017    Priority: Medium  . Overweight (BMI 25.0-29.9) 04/25/2017    Priority: Medium  . Prostatitis 04/20/2014    Priority: Medium  . Mood disorder ([place]) 03/29/2013    Priority: Medium  . Renal insufficiency 12/01/2011    Priority: Medium  . 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 Vitamin D deficiency 11/30/2007    Priority: Low  . Type 2 diabetes mellitus with renal complication ([place]) 02/72/5366  . Diabetes mellitus ([place]) 02/08/2018  . Chronic kidney disease (CKD), stage III (moderate) ([place]) 02/08/2018  . Tinea corporis 11/08/2017  . Chronic left hip pain  07/26/2017  . Facet arthropathy, lumbosacral 07/26/2017  . DDD (degenerative disc disease), lumbosacral 07/26/2017  . 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 Noncompliance 03/24/2017  . Abdominal bloating 05/15/2015  . UTI (urinary tract infection) 04/20/2014  . Hyperglycemia 04/20/2014  . Fatigue 03/07/2014  . Family history of early CAD 03/07/2014  . Maxillary sinusitis, acute 12/06/2013  . Tinea versicolor 12/06/2013  . Physical exam 11/06/2013  . Other malaise and fatigue 03/29/2013  . Decreased libido 03/29/2013  . Hematuria 02/17/2013  . Right elbow pain 12/15/2012  . Pustular folliculitis 44/03/4740  . Left ankle pain 03/15/2012  . SPINAL STENOSIS, LUMBAR 08/02/2008  . GOUT 11/30/2007  . BPH (benign prostatic hyperplasia) 11/30/2007  . ANEMIA NEC 06/11/2006  . Chronic back pain greater than 3 months duration 06/11/2006     No outpatient medications have been marked as taking for the 08/29/18 encounter (Office Visit) with Mellody Dance, DO.     Allergies  Allergen Reactions  . Prednisone Other (See Comments)    Caused blood sugar to increase, pt will not take     ROS:  See above HPI for pertinent positives and negatives   Objective:   Blood pressure 122/69, pulse 75, temperature (!) 95.3 F (35.2 C), temperature source Oral, height 5\' 11"  (1.803 m), weight 213 lb (96.6 kg).  (if some vitals are omitted, this means that patient was UNABLE to obtain them even though they were asked to get them prior to OV today.  They were asked to call us at their earliest convenience with these once obtained.)  General: A & O * 3; visually in no acute distress; in usual state of health.  Skin: Visible skin appears normal and pt's usual skin color HEENT:  EOMI, head is normocephalic and atraumatic.  Sclera are anicteric. Neck has a good range of motion.  Lips are noncyanotic Chest: normal chest excursion and  movement Respiratory: speaking in full sentences, no conversational dyspnea; no use of accessory muscles Psych: insight good, mood- appears full

## 2018-08-30 ENCOUNTER — Other Ambulatory Visit: Payer: PPO

## 2018-08-30 DIAGNOSIS — E1121 Type 2 diabetes mellitus with diabetic nephropathy: Secondary | ICD-10-CM | POA: Diagnosis not present

## 2018-08-30 DIAGNOSIS — N184 Chronic kidney disease, stage 4 (severe): Secondary | ICD-10-CM

## 2018-08-30 DIAGNOSIS — E1129 Type 2 diabetes mellitus with other diabetic kidney complication: Secondary | ICD-10-CM

## 2018-08-30 DIAGNOSIS — E1122 Type 2 diabetes mellitus with diabetic chronic kidney disease: Secondary | ICD-10-CM | POA: Diagnosis not present

## 2018-08-31 LAB — COMPREHENSIVE METABOLIC PANEL
ALT: 19 IU/L (ref 0–44)
AST: 25 IU/L (ref 0–40)
Albumin/Globulin Ratio: 1.7 (ref 1.2–2.2)
Albumin: 4.3 g/dL (ref 3.8–4.8)
Alkaline Phosphatase: 86 IU/L (ref 39–117)
BUN/Creatinine Ratio: 16 (ref 10–24)
BUN: 26 mg/dL (ref 8–27)
Bilirubin Total: 0.3 mg/dL (ref 0.0–1.2)
CO2: 24 mmol/L (ref 20–29)
Calcium: 9.3 mg/dL (ref 8.6–10.2)
Chloride: 100 mmol/L (ref 96–106)
Creatinine, Ser: 1.59 mg/dL — ABNORMAL HIGH (ref 0.76–1.27)
GFR calc Af Amer: 53 mL/min/{1.73_m2} — ABNORMAL LOW (ref >59)
GFR calc non Af Amer: 46 mL/min/{1.73_m2} — ABNORMAL LOW (ref >59)
Globulin, Total: 2.6 g/dL (ref 1.5–4.5)
Glucose: 114 mg/dL — ABNORMAL HIGH (ref 65–99)
Potassium: 5.4 mmol/L — ABNORMAL HIGH (ref 3.5–5.2)
Sodium: 137 mmol/L (ref 134–144)
Total Protein: 6.9 g/dL (ref 6.0–8.5)

## 2018-08-31 LAB — MAGNESIUM: Magnesium: 2 mg/dL (ref 1.6–2.3)

## 2018-08-31 LAB — PHOSPHORUS: Phosphorus: 3.4 mg/dL (ref 2.8–4.1)

## 2018-08-31 LAB — HEMOGLOBIN A1C
Est. average glucose Bld gHb Est-mCnc: 143 mg/dL
Hgb A1c MFr Bld: 6.6 % — ABNORMAL HIGH (ref 4.8–5.6)

## 2018-09-05 ENCOUNTER — Other Ambulatory Visit: Payer: Self-pay | Admitting: Family Medicine

## 2018-09-05 DIAGNOSIS — F900 Attention-deficit hyperactivity disorder, predominantly inattentive type: Secondary | ICD-10-CM

## 2018-09-05 NOTE — Telephone Encounter (Signed)
[name] am confused why this is being sent to me because upon review of the chart and my all last OV note, I wrote for 3 prescriptions last office visit on 08/09/2018.    I will not give him anymore as he already has them all sent electronically.  If you look under meds you will see this/ or my last office visit note on 8/4 showed that I gave 3 separate prescriptions and they were all sent electronically.    Hence, he will need to call his pharmacy for refills.

## 2018-09-05 NOTE — Telephone Encounter (Signed)
Pt's last OV was 08/29/2018.  Last RX written 08/09/2018.  Charyl Bigger, CMA

## 2018-09-06 DIAGNOSIS — N411 Chronic prostatitis: Secondary | ICD-10-CM | POA: Diagnosis not present

## 2018-09-20 ENCOUNTER — Other Ambulatory Visit: Payer: Self-pay

## 2018-09-20 DIAGNOSIS — R6889 Other general symptoms and signs: Secondary | ICD-10-CM | POA: Diagnosis not present

## 2018-09-20 DIAGNOSIS — Z20822 Contact with and (suspected) exposure to covid-19: Secondary | ICD-10-CM

## 2018-09-21 DIAGNOSIS — M7062 Trochanteric bursitis, left hip: Secondary | ICD-10-CM | POA: Diagnosis not present

## 2018-09-21 DIAGNOSIS — M7061 Trochanteric bursitis, right hip: Secondary | ICD-10-CM | POA: Diagnosis not present

## 2018-09-21 DIAGNOSIS — M47816 Spondylosis without myelopathy or radiculopathy, lumbar region: Secondary | ICD-10-CM | POA: Diagnosis not present

## 2018-09-21 DIAGNOSIS — M549 Dorsalgia, unspecified: Secondary | ICD-10-CM | POA: Diagnosis not present

## 2018-09-22 LAB — NOVEL CORONAVIRUS, NAA: SARS-CoV-2, NAA: NOT DETECTED

## 2018-09-26 DIAGNOSIS — M549 Dorsalgia, unspecified: Secondary | ICD-10-CM | POA: Insufficient documentation

## 2018-09-28 ENCOUNTER — Encounter (HOSPITAL_COMMUNITY): Payer: Self-pay

## 2018-09-28 ENCOUNTER — Other Ambulatory Visit: Payer: Self-pay

## 2018-09-28 ENCOUNTER — Ambulatory Visit (HOSPITAL_COMMUNITY)
Admission: EM | Admit: 2018-09-28 | Discharge: 2018-09-28 | Disposition: A | Discharge status: Home / Self Care | Payer: PPO

## 2018-09-28 DIAGNOSIS — E119 Type 2 diabetes mellitus without complications: Secondary | ICD-10-CM

## 2018-09-28 DIAGNOSIS — M7989 Other specified soft tissue disorders: Secondary | ICD-10-CM

## 2018-09-28 DIAGNOSIS — S61210A Laceration without foreign body of right index finger without damage to nail, initial encounter: Secondary | ICD-10-CM | POA: Diagnosis not present

## 2018-09-28 DIAGNOSIS — N184 Chronic kidney disease, stage 4 (severe): Secondary | ICD-10-CM

## 2018-09-28 DIAGNOSIS — W298XXA Contact with other powered powered hand tools and household machinery, initial encounter: Secondary | ICD-10-CM

## 2018-09-28 DIAGNOSIS — M79644 Pain in right finger(s): Secondary | ICD-10-CM | POA: Diagnosis not present

## 2018-09-28 DIAGNOSIS — I10 Essential (primary) hypertension: Secondary | ICD-10-CM | POA: Diagnosis not present

## 2018-09-28 MED ORDER — LIDOCAINE HCL 2 % IJ SOLN
INTRAMUSCULAR | Status: AC
  Filled 2018-09-28: qty 20

## 2018-09-28 NOTE — ED Notes (Signed)
Patient able to ambulate independently  

## 2018-09-28 NOTE — ED Triage Notes (Signed)
Pt states he has a laceration on his right hand index finger. Pt states he cut it on a drill bit.

## 2018-09-28 NOTE — ED Provider Notes (Signed)
MRN: [id number] DOB: September 27, 1955  Subjective:   [name] is a 63 y.o. male presenting for suffering a laceration to his right index finger while using a drill bit.  Injury occurred yesterday at 16:30.  This makes his injury approximately 78 hours old.  He has kept his wound very clean, saw the nurse this morning and had a cleaned again.  His last Tdap was 6 months ago.  He is currently on an antibiotic course as prescribed by his orthopedist.  No current facility-administered medications for this encounter.   Current Outpatient Medications:  .  allopurinol (ZYLOPRIM) 300 MG tablet, TAKE 1/2 TABLET BY MOUTH DAILY, Disp: 45 tablet, Rfl: 1 .  amphetamine-dextroamphetamine (ADDERALL) 10 MG tablet, Take 1 tablet (10 mg total) by mouth daily with breakfast., Disp: 30 tablet, Rfl: 0 .  amphetamine-dextroamphetamine (ADDERALL) 10 MG tablet, Take 1 tablet (10 mg total) by mouth daily with breakfast., Disp: 30 tablet, Rfl: 0 .  [START ON 10/09/2018] amphetamine-dextroamphetamine (ADDERALL) 10 MG tablet, Take 1 tablet (10 mg total) by mouth daily with breakfast., Disp: 30 tablet, Rfl: 0 .  Ascorbic Acid (VITAMIN C) 1000 MG tablet, Take 1,000 mg by mouth daily., Disp: , Rfl:  .  aspirin 81 MG tablet, Take 81 mg by mouth daily.  , Disp: , Rfl:  .  atorvastatin (LIPITOR) 40 MG tablet, TAKE 1 TABLET BY MOUTH EVERY NIGHT AT BEDTIME, Disp: 90 tablet, Rfl: 1 .  benazepril (LOTENSIN) 20 MG tablet, TAKE 1 TABLET BY MOUTH DAILY, Disp: 90 tablet, Rfl: 1 .  blood glucose meter kit and supplies, Dispense based on patient and insurance preference. Use to check fasting blood glucose in the morning and to check glucose 2 hours after largest meal of the day. (FOR ICD-10 E10.9, E11.9)., Disp: 1 each, Rfl: 0 .  clotrimazole-betamethasone (LOTRISONE) cream, Apply 1 application topically 2 (two) times daily., Disp: 80 g, Rfl: 1 .  fluconazole (DIFLUCAN) 200 MG tablet, 1 tab po each wk times 3 wks, Disp: 3 tablet, Rfl: 1 .   FLUoxetine (PROZAC) 40 MG capsule, TAKE 1 CAPSULE BY MOUTH DAILY, Disp: 90 capsule, Rfl: 1 .  fluticasone (FLONASE) 50 MCG/ACT nasal spray, Place 2 sprays into both nostrils daily., Disp: 16 g, Rfl: 6 .  gabapentin (NEURONTIN) 800 MG tablet, Take 1 tablet (800 mg total) by mouth 3 (three) times daily., Disp: 270 tablet, Rfl: 1 .  glucose blood test strip, Use as instructed, Disp: 100 each, Rfl: 12 .  ketoconazole (NIZORAL) 2 % cream, Apply 1 application topically 2 (two) times daily. To affected areas., Disp: 60 g, Rfl: 1 .  LANCETS ULTRA FINE MISC, Use to check fasting blood glucose in the morning and to check glucose 2 hours after largest meal of the day, Disp: 100 each, Rfl: 12 .  metFORMIN (GLUCOPHAGE-XR) 500 MG 24 hr tablet, TAKE 1 TABLET BY MOUTH DAILY WITH BREAKFAST, Disp: 90 tablet, Rfl: 1 .  Multiple Vitamins-Iron (MULTIVITAMINS WITH IRON) TABS, Take 1 tablet by mouth daily., Disp: 30 tablet, Rfl: 0 .  MYRBETRIQ 25 MG TB24 tablet, , Disp: , Rfl:  .  pantoprazole (PROTONIX) 40 MG tablet, TAKE 1 TABLET BY MOUTH DAILY, Disp: 90 tablet, Rfl: 1 .  polyethylene glycol powder (GLYCOLAX/MIRALAX) powder, USE AS DIRECTED, Disp: 850 g, Rfl: 11 .  pyridoxine (B-6) 100 MG tablet, Take 100 mg by mouth daily., Disp: , Rfl:  .  sulfamethoxazole-trimethoprim (BACTRIM DS) 800-160 MG tablet, Take 1 tablet by mouth 2 (two) times daily., Disp: ,  Rfl:  .  traZODone (DESYREL) 50 MG tablet, TAKE 1/2 TO 1 TABLET BY MOUTH AT BEDTIMEAS NEEDED FOR SLEEP, Disp: 30 tablet, Rfl: 6 .  Vitamin D, Ergocalciferol, (DRISDOL) 1.25 MG (50000 UT) CAPS capsule, TAKE 1 CAPSULE BY MOUTH EVERY 7 DAYS, Disp: 12 capsule, Rfl: 10   Allergies  Allergen Reactions  . Prednisone Other (See Comments)    Caused blood sugar to increase, pt will not take    Past Medical History:  Diagnosis Date  . Carpal tunnel syndrome, bilateral   . Carpal tunnel syndrome, right    nerve impingement right arm/wears brace  . Diabetes mellitus    no  meds  . GERD (gastroesophageal reflux disease)   . Gout   . Hepatic steatosis   . Hyperlipidemia   . Hypertension   . Obstructive sleep apnea      Past Surgical History:  Procedure Laterality Date  . CARPAL TUNNEL RELEASE  09/10/2015   RIGHT WRIST / WITH NERVE IMPINGEMENT SURGERY  . CERVICAL FUSION     Dr Sherwood Gambler  . COLONOSCOPY  2007   negative; [facility]  . ESOPHAGEAL DILATION  2007  . 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 LUMBAR FUSION      Dr Sherwood Gambler    ROS  Objective:   Vitals: Wt 220 lb (99.8 kg)   BMI 30.68 kg/m   Physical Exam Constitutional:      Appearance: Normal appearance. He is well-developed and normal weight.  HENT:     Head: Normocephalic and atraumatic.     Right Ear: External ear normal.     Left Ear: External ear normal.     Nose: Nose normal.     Mouth/Throat:     Pharynx: Oropharynx is clear.  Eyes:     Extraocular Movements: Extraocular movements intact.     Pupils: Pupils are equal, round, and reactive to light.  Cardiovascular:     Rate and Rhythm: Normal rate.  Pulmonary:     Effort: Pulmonary effort is normal.  Musculoskeletal:       Hands:  Neurological:     Mental Status: He is alert and oriented to person, place, and time.  Psychiatric:        Mood and Affect: Mood normal.        Behavior: Behavior normal.     PROCEDURE NOTE: laceration repair Verbal consent obtained from patient.  Local anesthesia with 3cc Lidocaine 2% without epinephrine.  Wound explored for tendon, ligament damage. Wound scrubbed with soap and water and rinsed. Wound closed with #4 4-0 Ethilon (simple interrupted) sutures.  Wound cleansed and dressed.   Assessment and Plan :   1. Laceration of right index finger without damage to nail, foreign body presence unspecified, initial encounter   2. Finger pain, right   3. Swelling of right index finger     Patient has multiple risk factors including diabetes, CKD 4,  HTN and therefore decided to close wound despite age of his wound.  The wound was loosely approximated.  Is reassuring that he has had his wound cleaned by a nurse and also has kept his wound clean.  He is also currently taking an antibiotic.  Laceration repaired successfully. Wound care reviewed. Return-to-clinic precautions discussed, patient verbalized understanding. Otherwise, follow up in 10 days for suture removal.  Maintain current antibiotic as prescribed by his orthopedist.   Jaynee Eagles, PA-C 09/28/18 1612

## 2018-09-28 NOTE — Discharge Instructions (Signed)

## 2018-10-06 DIAGNOSIS — M25522 Pain in left elbow: Secondary | ICD-10-CM | POA: Diagnosis not present

## 2018-10-06 DIAGNOSIS — M79641 Pain in right hand: Secondary | ICD-10-CM | POA: Insufficient documentation

## 2018-10-06 DIAGNOSIS — M65332 Trigger finger, left middle finger: Secondary | ICD-10-CM | POA: Insufficient documentation

## 2018-10-06 DIAGNOSIS — M65321 Trigger finger, right index finger: Secondary | ICD-10-CM | POA: Diagnosis not present

## 2018-10-06 DIAGNOSIS — M65331 Trigger finger, right middle finger: Secondary | ICD-10-CM | POA: Diagnosis not present

## 2018-10-06 DIAGNOSIS — Z951 Presence of aortocoronary bypass graft: Secondary | ICD-10-CM

## 2018-10-06 DIAGNOSIS — M79642 Pain in left hand: Secondary | ICD-10-CM | POA: Diagnosis not present

## 2018-10-06 DIAGNOSIS — M65352 Trigger finger, left little finger: Secondary | ICD-10-CM | POA: Diagnosis not present

## 2018-10-06 DIAGNOSIS — M7702 Medial epicondylitis, left elbow: Secondary | ICD-10-CM | POA: Diagnosis not present

## 2018-10-06 HISTORY — DX: Presence of aortocoronary bypass graft: Z95.1

## 2018-10-07 ENCOUNTER — Other Ambulatory Visit: Payer: Self-pay | Admitting: Family Medicine

## 2018-10-07 DIAGNOSIS — Z23 Encounter for immunization: Secondary | ICD-10-CM | POA: Diagnosis not present

## 2018-10-07 DIAGNOSIS — E1122 Type 2 diabetes mellitus with diabetic chronic kidney disease: Secondary | ICD-10-CM | POA: Diagnosis not present

## 2018-10-07 DIAGNOSIS — I129 Hypertensive chronic kidney disease with stage 1 through stage 4 chronic kidney disease, or unspecified chronic kidney disease: Secondary | ICD-10-CM | POA: Diagnosis not present

## 2018-10-07 DIAGNOSIS — M109 Gout, unspecified: Secondary | ICD-10-CM | POA: Diagnosis not present

## 2018-10-07 DIAGNOSIS — E875 Hyperkalemia: Secondary | ICD-10-CM | POA: Diagnosis not present

## 2018-10-07 DIAGNOSIS — N183 Chronic kidney disease, stage 3 unspecified: Secondary | ICD-10-CM | POA: Diagnosis not present

## 2018-10-12 DIAGNOSIS — M545 Low back pain: Secondary | ICD-10-CM | POA: Diagnosis not present

## 2018-10-12 DIAGNOSIS — M47816 Spondylosis without myelopathy or radiculopathy, lumbar region: Secondary | ICD-10-CM | POA: Diagnosis not present

## 2018-10-12 DIAGNOSIS — M47817 Spondylosis without myelopathy or radiculopathy, lumbosacral region: Secondary | ICD-10-CM | POA: Diagnosis not present

## 2018-10-12 DIAGNOSIS — M109 Gout, unspecified: Secondary | ICD-10-CM | POA: Diagnosis not present

## 2018-10-12 DIAGNOSIS — E119 Type 2 diabetes mellitus without complications: Secondary | ICD-10-CM | POA: Diagnosis not present

## 2018-10-12 DIAGNOSIS — I10 Essential (primary) hypertension: Secondary | ICD-10-CM | POA: Diagnosis not present

## 2018-10-12 DIAGNOSIS — K219 Gastro-esophageal reflux disease without esophagitis: Secondary | ICD-10-CM | POA: Diagnosis not present

## 2018-10-12 DIAGNOSIS — Z87891 Personal history of nicotine dependence: Secondary | ICD-10-CM | POA: Diagnosis not present

## 2018-10-12 DIAGNOSIS — E7800 Pure hypercholesterolemia, unspecified: Secondary | ICD-10-CM | POA: Diagnosis not present

## 2018-10-12 DIAGNOSIS — G4730 Sleep apnea, unspecified: Secondary | ICD-10-CM | POA: Diagnosis not present

## 2018-10-17 ENCOUNTER — Other Ambulatory Visit: Payer: Self-pay | Admitting: Family Medicine

## 2018-10-19 DIAGNOSIS — M25551 Pain in right hip: Secondary | ICD-10-CM | POA: Diagnosis not present

## 2018-10-19 DIAGNOSIS — M545 Low back pain: Secondary | ICD-10-CM | POA: Diagnosis not present

## 2018-10-19 DIAGNOSIS — Z125 Encounter for screening for malignant neoplasm of prostate: Secondary | ICD-10-CM | POA: Diagnosis not present

## 2018-10-19 DIAGNOSIS — M7061 Trochanteric bursitis, right hip: Secondary | ICD-10-CM | POA: Diagnosis not present

## 2018-10-19 DIAGNOSIS — M47816 Spondylosis without myelopathy or radiculopathy, lumbar region: Secondary | ICD-10-CM | POA: Diagnosis not present

## 2018-10-24 ENCOUNTER — Other Ambulatory Visit: Payer: Self-pay

## 2018-10-24 ENCOUNTER — Inpatient Hospital Stay (HOSPITAL_COMMUNITY)
Admission: EM | Admit: 2018-10-24 | Discharge: 2018-10-31 | DRG: 234 | Disposition: A | Discharge status: Home / Self Care | Payer: PPO | Attending: Thoracic Surgery (Cardiothoracic Vascular Surgery) | Admitting: Thoracic Surgery (Cardiothoracic Vascular Surgery)

## 2018-10-24 ENCOUNTER — Emergency Department (HOSPITAL_COMMUNITY): Payer: PPO

## 2018-10-24 ENCOUNTER — Telehealth: Payer: Self-pay | Admitting: Family Medicine

## 2018-10-24 ENCOUNTER — Encounter (HOSPITAL_COMMUNITY): Payer: Self-pay

## 2018-10-24 ENCOUNTER — Ambulatory Visit (HOSPITAL_COMMUNITY)
Admission: EM | Admit: 2018-10-24 | Discharge: 2018-10-24 | Disposition: A | Discharge status: Home / Self Care | Payer: PPO | Source: Home / Self Care

## 2018-10-24 DIAGNOSIS — E1169 Type 2 diabetes mellitus with other specified complication: Secondary | ICD-10-CM | POA: Diagnosis present

## 2018-10-24 DIAGNOSIS — I340 Nonrheumatic mitral (valve) insufficiency: Secondary | ICD-10-CM | POA: Diagnosis not present

## 2018-10-24 DIAGNOSIS — K219 Gastro-esophageal reflux disease without esophagitis: Secondary | ICD-10-CM | POA: Diagnosis not present

## 2018-10-24 DIAGNOSIS — Z87440 Personal history of urinary (tract) infections: Secondary | ICD-10-CM | POA: Diagnosis not present

## 2018-10-24 DIAGNOSIS — Z9889 Other specified postprocedural states: Secondary | ICD-10-CM

## 2018-10-24 DIAGNOSIS — I25110 Atherosclerotic heart disease of native coronary artery with unstable angina pectoris: Secondary | ICD-10-CM | POA: Diagnosis not present

## 2018-10-24 DIAGNOSIS — G8929 Other chronic pain: Secondary | ICD-10-CM | POA: Diagnosis present

## 2018-10-24 DIAGNOSIS — Z79899 Other long term (current) drug therapy: Secondary | ICD-10-CM | POA: Diagnosis not present

## 2018-10-24 DIAGNOSIS — Z87891 Personal history of nicotine dependence: Secondary | ICD-10-CM

## 2018-10-24 DIAGNOSIS — Z8249 Family history of ischemic heart disease and other diseases of the circulatory system: Secondary | ICD-10-CM

## 2018-10-24 DIAGNOSIS — E785 Hyperlipidemia, unspecified: Secondary | ICD-10-CM | POA: Diagnosis present

## 2018-10-24 DIAGNOSIS — G4733 Obstructive sleep apnea (adult) (pediatric): Secondary | ICD-10-CM | POA: Diagnosis present

## 2018-10-24 DIAGNOSIS — Z888 Allergy status to other drugs, medicaments and biological substances status: Secondary | ICD-10-CM

## 2018-10-24 DIAGNOSIS — I10 Essential (primary) hypertension: Secondary | ICD-10-CM | POA: Diagnosis present

## 2018-10-24 DIAGNOSIS — E1159 Type 2 diabetes mellitus with other circulatory complications: Secondary | ICD-10-CM | POA: Diagnosis present

## 2018-10-24 DIAGNOSIS — F329 Major depressive disorder, single episode, unspecified: Secondary | ICD-10-CM | POA: Diagnosis present

## 2018-10-24 DIAGNOSIS — D62 Acute posthemorrhagic anemia: Secondary | ICD-10-CM | POA: Diagnosis not present

## 2018-10-24 DIAGNOSIS — E1129 Type 2 diabetes mellitus with other diabetic kidney complication: Secondary | ICD-10-CM | POA: Diagnosis present

## 2018-10-24 DIAGNOSIS — E1122 Type 2 diabetes mellitus with diabetic chronic kidney disease: Secondary | ICD-10-CM | POA: Diagnosis not present

## 2018-10-24 DIAGNOSIS — R079 Chest pain, unspecified: Secondary | ICD-10-CM | POA: Diagnosis present

## 2018-10-24 DIAGNOSIS — I200 Unstable angina: Secondary | ICD-10-CM | POA: Diagnosis not present

## 2018-10-24 DIAGNOSIS — M549 Dorsalgia, unspecified: Secondary | ICD-10-CM | POA: Diagnosis present

## 2018-10-24 DIAGNOSIS — M109 Gout, unspecified: Secondary | ICD-10-CM | POA: Diagnosis present

## 2018-10-24 DIAGNOSIS — Z951 Presence of aortocoronary bypass graft: Secondary | ICD-10-CM

## 2018-10-24 DIAGNOSIS — K760 Fatty (change of) liver, not elsewhere classified: Secondary | ICD-10-CM | POA: Diagnosis present

## 2018-10-24 DIAGNOSIS — I129 Hypertensive chronic kidney disease with stage 1 through stage 4 chronic kidney disease, or unspecified chronic kidney disease: Secondary | ICD-10-CM | POA: Diagnosis not present

## 2018-10-24 DIAGNOSIS — N183 Chronic kidney disease, stage 3 unspecified: Secondary | ICD-10-CM | POA: Diagnosis not present

## 2018-10-24 DIAGNOSIS — N184 Chronic kidney disease, stage 4 (severe): Secondary | ICD-10-CM | POA: Diagnosis not present

## 2018-10-24 DIAGNOSIS — Z7984 Long term (current) use of oral hypoglycemic drugs: Secondary | ICD-10-CM | POA: Diagnosis not present

## 2018-10-24 DIAGNOSIS — I2510 Atherosclerotic heart disease of native coronary artery without angina pectoris: Secondary | ICD-10-CM | POA: Diagnosis present

## 2018-10-24 DIAGNOSIS — Z7982 Long term (current) use of aspirin: Secondary | ICD-10-CM

## 2018-10-24 DIAGNOSIS — Z01810 Encounter for preprocedural cardiovascular examination: Secondary | ICD-10-CM | POA: Diagnosis not present

## 2018-10-24 DIAGNOSIS — J90 Pleural effusion, not elsewhere classified: Secondary | ICD-10-CM

## 2018-10-24 DIAGNOSIS — I152 Hypertension secondary to endocrine disorders: Secondary | ICD-10-CM | POA: Diagnosis not present

## 2018-10-24 DIAGNOSIS — E1121 Type 2 diabetes mellitus with diabetic nephropathy: Secondary | ICD-10-CM | POA: Diagnosis present

## 2018-10-24 DIAGNOSIS — Z981 Arthrodesis status: Secondary | ICD-10-CM

## 2018-10-24 DIAGNOSIS — R0602 Shortness of breath: Secondary | ICD-10-CM

## 2018-10-24 DIAGNOSIS — Z833 Family history of diabetes mellitus: Secondary | ICD-10-CM

## 2018-10-24 DIAGNOSIS — Z20828 Contact with and (suspected) exposure to other viral communicable diseases: Secondary | ICD-10-CM | POA: Diagnosis not present

## 2018-10-24 DIAGNOSIS — I25810 Atherosclerosis of coronary artery bypass graft(s) without angina pectoris: Secondary | ICD-10-CM | POA: Diagnosis not present

## 2018-10-24 DIAGNOSIS — E559 Vitamin D deficiency, unspecified: Secondary | ICD-10-CM | POA: Diagnosis present

## 2018-10-24 DIAGNOSIS — N1831 Chronic kidney disease, stage 3a: Secondary | ICD-10-CM | POA: Diagnosis not present

## 2018-10-24 DIAGNOSIS — R072 Precordial pain: Secondary | ICD-10-CM | POA: Diagnosis not present

## 2018-10-24 DIAGNOSIS — J9811 Atelectasis: Secondary | ICD-10-CM | POA: Diagnosis not present

## 2018-10-24 HISTORY — DX: Dyspnea, unspecified: R06.00

## 2018-10-24 LAB — TROPONIN I (HIGH SENSITIVITY)
Troponin I (High Sensitivity): 4 ng/L (ref <18)
Troponin I (High Sensitivity): 5 ng/L (ref <18)

## 2018-10-24 LAB — CBC
HCT: 37.8 % — ABNORMAL LOW (ref 39.0–52.0)
Hemoglobin: 12.3 g/dL — ABNORMAL LOW (ref 13.0–17.0)
MCH: 27.7 pg (ref 26.0–34.0)
MCHC: 32.5 g/dL (ref 30.0–36.0)
MCV: 85.1 fL (ref 80.0–100.0)
Platelets: 209 10*3/uL (ref 150–400)
RBC: 4.44 MIL/uL (ref 4.22–5.81)
RDW: 13.8 % (ref 11.5–15.5)
WBC: 4.2 10*3/uL (ref 4.0–10.5)
nRBC: 0 % (ref 0.0–0.2)

## 2018-10-24 LAB — HEPATIC FUNCTION PANEL
ALT: 28 U/L (ref 0–44)
AST: 41 U/L (ref 15–41)
Albumin: 4.4 g/dL (ref 3.5–5.0)
Alkaline Phosphatase: 73 U/L (ref 38–126)
Bilirubin, Direct: 0.2 mg/dL (ref 0.0–0.2)
Indirect Bilirubin: 0.6 mg/dL (ref 0.3–0.9)
Total Bilirubin: 0.8 mg/dL (ref 0.3–1.2)
Total Protein: 8.2 g/dL — ABNORMAL HIGH (ref 6.5–8.1)

## 2018-10-24 LAB — BASIC METABOLIC PANEL
Anion gap: 8 (ref 5–15)
BUN: 29 mg/dL — ABNORMAL HIGH (ref 8–23)
CO2: 25 mmol/L (ref 22–32)
Calcium: 9.8 mg/dL (ref 8.9–10.3)
Chloride: 103 mmol/L (ref 98–111)
Creatinine, Ser: 1.77 mg/dL — ABNORMAL HIGH (ref 0.61–1.24)
GFR calc Af Amer: 46 mL/min — ABNORMAL LOW (ref >60)
GFR calc non Af Amer: 40 mL/min — ABNORMAL LOW (ref >60)
Glucose, Bld: 97 mg/dL (ref 70–99)
Potassium: 4.9 mmol/L (ref 3.5–5.1)
Sodium: 136 mmol/L (ref 135–145)

## 2018-10-24 LAB — D-DIMER, QUANTITATIVE: D-Dimer, Quant: 0.72 ug/mL-FEU — ABNORMAL HIGH (ref 0.00–0.50)

## 2018-10-24 LAB — LIPASE, BLOOD: Lipase: 37 U/L (ref 11–51)

## 2018-10-24 MED ORDER — HEPARIN SODIUM (PORCINE) 5000 UNIT/ML IJ SOLN
5000.0000 [IU] | Freq: Three times a day (TID) | INTRAMUSCULAR | Status: DC
  Administered 2018-10-25 (×2): 5000 [IU] via SUBCUTANEOUS
  Filled 2018-10-24 (×2): qty 1

## 2018-10-24 MED ORDER — SODIUM CHLORIDE 0.9 % IV SOLN
INTRAVENOUS | Status: AC
  Administered 2018-10-25: 02:00:00 via INTRAVENOUS

## 2018-10-24 MED ORDER — IOHEXOL 350 MG/ML SOLN
100.0000 mL | Freq: Once | INTRAVENOUS | Status: AC | PRN
  Administered 2018-10-24: 100 mL via INTRAVENOUS

## 2018-10-24 MED ORDER — ALUM & MAG HYDROXIDE-SIMETH 200-200-20 MG/5ML PO SUSP
30.0000 mL | Freq: Once | ORAL | Status: AC
  Administered 2018-10-24: 30 mL via ORAL
  Filled 2018-10-24: qty 30

## 2018-10-24 MED ORDER — GABAPENTIN 400 MG PO CAPS
800.0000 mg | ORAL_CAPSULE | Freq: Three times a day (TID) | ORAL | Status: DC
  Administered 2018-10-25 (×4): 800 mg via ORAL
  Filled 2018-10-24 (×4): qty 2

## 2018-10-24 MED ORDER — LIDOCAINE VISCOUS HCL 2 % MT SOLN
15.0000 mL | Freq: Once | OROMUCOSAL | Status: AC
  Administered 2018-10-24: 15 mL via ORAL
  Filled 2018-10-24: qty 15

## 2018-10-24 MED ORDER — ATORVASTATIN CALCIUM 40 MG PO TABS
40.0000 mg | ORAL_TABLET | Freq: Every day | ORAL | Status: DC
  Administered 2018-10-25: 40 mg via ORAL
  Filled 2018-10-24: qty 1

## 2018-10-24 MED ORDER — ONDANSETRON HCL 4 MG/2ML IJ SOLN: 4.0000 mg | Freq: Four times a day (QID) | INTRAMUSCULAR | Status: DC | PRN

## 2018-10-24 MED ORDER — ASPIRIN EC 81 MG PO TBEC
81.0000 mg | DELAYED_RELEASE_TABLET | Freq: Every day | ORAL | Status: DC
  Administered 2018-10-25: 81 mg via ORAL
  Filled 2018-10-24: qty 1

## 2018-10-24 MED ORDER — BENAZEPRIL HCL 20 MG PO TABS
20.0000 mg | ORAL_TABLET | Freq: Every day | ORAL | Status: DC
  Administered 2018-10-25: 20 mg via ORAL
  Filled 2018-10-24: qty 1

## 2018-10-24 MED ORDER — PANTOPRAZOLE SODIUM 20 MG PO TBEC
20.0000 mg | DELAYED_RELEASE_TABLET | Freq: Every day | ORAL | Status: DC
  Administered 2018-10-25 (×2): 20 mg via ORAL
  Filled 2018-10-24 (×2): qty 1

## 2018-10-24 MED ORDER — FLUOXETINE HCL 40 MG PO CAPS: 40.0000 mg | ORAL_CAPSULE | Freq: Every day | ORAL | Status: DC

## 2018-10-24 MED ORDER — FLUOXETINE HCL 20 MG PO CAPS
40.0000 mg | ORAL_CAPSULE | Freq: Every day | ORAL | Status: DC
  Administered 2018-10-25 – 2018-10-31 (×6): 40 mg via ORAL
  Filled 2018-10-24 (×6): qty 2

## 2018-10-24 MED ORDER — ACETAMINOPHEN 325 MG PO TABS: 650.0000 mg | ORAL_TABLET | ORAL | Status: DC | PRN

## 2018-10-24 MED ORDER — TRAMADOL HCL 50 MG PO TABS: 50.0000 mg | ORAL_TABLET | Freq: Two times a day (BID) | ORAL | Status: DC | PRN

## 2018-10-24 NOTE — Discharge Instructions (Addendum)
Go to the emergency department for evaluation of your shortness of breath with chest pain upon exertion.

## 2018-10-24 NOTE — Telephone Encounter (Signed)
Please call pt and ask about sx in greater detail.     When did the pain start?    How long does it last?     Where is the pain exactly?   Does it radiate anywhere?    What makes it worse/come on?     What makes it improve/go away?    Did pt try aspirin or any other medicine for this?  What other associated symptoms is pt experiencing during the same time as the chest pain/ discomfort?  Has pt ever had this pain before?    If so did pt see a cardiologist or specialist for it?   Who and When?   Lastly, is pt currently having chest pain right now?

## 2018-10-24 NOTE — ED Triage Notes (Addendum)
Pt states he has heartburn, chest pain and shortness of breath x 1 month. Pt states the symptoms are worse when he try to pick up a heavy object.

## 2018-10-24 NOTE — Telephone Encounter (Signed)
Pt is currently at the ED.  Charyl Bigger, CMA

## 2018-10-24 NOTE — ED Provider Notes (Signed)
[facility]    CSN: [id number] Arrival date & time: 10/24/18  1233      History   Chief Complaint Chief Complaint  Patient presents with  . Appointment    1:10  . Heartburn  . Chest Pain  . Shortness of Breath    HPI [name] is a 62 y.o. male.   Patient presents with episodes of midsternal chest pain and shortness of breath with exertion.  He reports the episodes have been occurring for approximately 1 month and the last episode was yesterday.  He states the episodes start when he is lifting heavy objects or walking; symptoms resolve with rest.  He is currently asymptomatic while sitting in the exam room.  He denies current chest pain, shortness of breath, dizziness, weakness, headache, or other symptoms.    The history is provided by the patient.    Past Medical History:  Diagnosis Date  . Carpal tunnel syndrome, bilateral   . Carpal tunnel syndrome, right    nerve impingement right arm/wears brace  . Diabetes mellitus    no meds  . GERD (gastroesophageal reflux disease)   . Gout   . Hepatic steatosis   . Hyperlipidemia   . Hypertension   . Obstructive sleep apnea     Patient Active Problem List   Diagnosis Date Noted  . Type 2 diabetes mellitus with renal complication ([place]) 98/33/8250  . Diabetes mellitus ([place]) 02/08/2018  . Chronic kidney disease (CKD), stage III (moderate) 02/08/2018  . Tinea corporis 11/08/2017  . Chronic left hip pain 07/26/2017  . Facet arthropathy, lumbosacral 07/26/2017  . DDD (degenerative disc disease), lumbosacral 07/26/2017  . Attention deficit hyperactivity disorder (ADHD), predominantly inattentive type 07/26/2017  . 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 Overweight (BMI 25.0-29.9) 04/25/2017  . Noncompliance 03/24/2017  . Abdominal bloating 05/15/2015  . UTI (urinary tract infection) 04/20/2014  . Prostatitis 04/20/2014  . Hyperglycemia  04/20/2014  . Fatigue 03/07/2014  . Family history of early CAD 03/07/2014  . Maxillary sinusitis, acute 12/06/2013  . Tinea versicolor 12/06/2013  . Physical exam 11/06/2013  . 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 Mood disorder ([place]) 03/29/2013  . Other malaise and fatigue 03/29/2013  . Decreased libido 03/29/2013  . Hematuria 02/17/2013  . Right elbow pain 12/15/2012  . Pustular folliculitis 53/97/6734  . Left ankle pain 03/15/2012  . Hypertension associated with diabetes ([place]) 12/01/2011  . Renal insufficiency 12/01/2011  . SPINAL STENOSIS, LUMBAR 08/02/2008  . Vitamin D deficiency 11/30/2007  . GOUT 11/30/2007  . BPH (benign prostatic hyperplasia) 11/30/2007  . Diabetes mellitus with renal manifestation ([place]) 06/11/2006  . ANEMIA NEC 06/11/2006  . Chronic back pain greater than 3 months duration 06/11/2006  . Hyperlipidemia associated with type 2 diabetes mellitus ([place]) 05/26/2006  . OBSTRUCTIVE SLEEP APNEA 05/26/2006    Past Surgical History:  Procedure Laterality Date  . CARPAL TUNNEL RELEASE  09/10/2015   RIGHT WRIST / WITH NERVE IMPINGEMENT SURGERY  . CERVICAL FUSION     Dr Sherwood Gambler  . COLONOSCOPY  2007   negative; [facility]  . ESOPHAGEAL DILATION  2007  . 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 LUMBAR FUSION      Dr Sherwood Gambler       Home Medications    Prior to Admission medications   Medication Sig Start Date End Date Taking? Authorizing Provider  allopurinol (ZYLOPRIM) 300 MG tablet TAKE 1/2 TABLET BY MOUTH DAILY 10/17/18   Mellody Dance, DO  amphetamine-dextroamphetamine (ADDERALL) 10 MG tablet Take 1 tablet (10 mg total) by mouth daily with breakfast. 08/09/18 09/08/18  Mellody Dance, DO  amphetamine-dextroamphetamine (ADDERALL) 10 MG tablet Take 1 tablet (10 mg total) by mouth daily with breakfast. 09/09/18 10/09/18  Mellody Dance, DO  amphetamine-dextroamphetamine (ADDERALL) 10 MG tablet Take 1  tablet (10 mg total) by mouth daily with breakfast. 10/09/18 11/08/18  Opalski, Neoma Laming, DO  Ascorbic Acid (VITAMIN C) 1000 MG tablet Take 1,000 mg by mouth daily.    [provider]  aspirin 81 MG tablet Take 81 mg by mouth daily.      [provider]  atorvastatin (LIPITOR) 40 MG tablet TAKE 1 TABLET BY MOUTH EACH NIGHT AT BEDTIME 10/07/18   Opalski, Deborah, DO  benazepril (LOTENSIN) 20 MG tablet TAKE 1 TABLET BY MOUTH DAILY 07/05/18   Opalski, Neoma Laming, DO  blood glucose meter kit and supplies Dispense based on patient and insurance preference. Use to check fasting blood glucose in the morning and to check glucose 2 hours after largest meal of the day. (FOR ICD-10 E10.9, E11.9). 01/13/17   Opalski, Neoma Laming, DO  clotrimazole-betamethasone (LOTRISONE) cream Apply 1 application topically 2 (two) times daily. 11/08/17   Mellody Dance, DO  fluconazole (DIFLUCAN) 200 MG tablet 1 tab po each wk times 3 wks 08/29/18   Mellody Dance, DO  FLUoxetine (PROZAC) 40 MG capsule TAKE 1 CAPSULE BY MOUTH DAILY 06/28/18   Opalski, Neoma Laming, DO  fluticasone (FLONASE) 50 MCG/ACT nasal spray Place 2 sprays into both nostrils daily. 12/23/15   Brunetta Jeans, PA-C  gabapentin (NEURONTIN) 800 MG tablet Take 1 tablet (800 mg total) by mouth 3 (three) times daily. 05/06/18   Mellody Dance, DO  glucose blood test strip Use as instructed 01/13/17   Opalski, Neoma Laming, DO  ketoconazole (NIZORAL) 2 % cream Apply 1 application topically 2 (two) times daily. To affected areas. 08/29/18   Mellody Dance, DO  LANCETS ULTRA FINE MISC Use to check fasting blood glucose in the morning and to check glucose 2 hours after largest meal of the day 01/13/17   Mellody Dance, DO  metFORMIN (GLUCOPHAGE-XR) 500 MG 24 hr tablet TAKE 1 TABLET BY MOUTH DAILY WITH BREAKFAST 08/15/18   Opalski, Deborah, DO  Multiple Vitamins-Iron (MULTIVITAMINS WITH IRON) TABS Take 1 tablet by mouth daily. 11/27/11   Awilda Metro, NP  MYRBETRIQ  25 MG TB24 tablet  05/15/16   [provider]  pantoprazole (PROTONIX) 40 MG tablet TAKE 1 TABLET BY MOUTH DAILY 05/25/18   Opalski, Neoma Laming, DO  polyethylene glycol powder (GLYCOLAX/MIRALAX) powder USE AS DIRECTED 07/17/14   Midge Minium, MD  pyridoxine (B-6) 100 MG tablet Take 100 mg by mouth daily.    [provider]  sulfamethoxazole-trimethoprim (BACTRIM DS) 800-160 MG tablet Take 1 tablet by mouth 2 (two) times daily. 07/26/18   [provider]  traZODone (DESYREL) 50 MG tablet TAKE 1/2 TO 1 TABLET BY MOUTH AT [facility] NEEDED FOR SLEEP 10/07/18   Opalski, Neoma Laming, DO  Vitamin D, Ergocalciferol, (DRISDOL) 1.25 MG (50000 UT) CAPS capsule TAKE 1 CAPSULE BY MOUTH EVERY 7 DAYS 03/01/18   Mellody Dance, DO    Family History Family History  Problem Relation Age of Onset  . Heart disease Father        pacer  . Urolithiasis Father   . Heart attack Father   . Aneurysm Mother  30       CNS aneurysm  . Sudden death Mother   . Diabetes Sister   . Cancer Maternal Uncle        ? primary  . Cancer Paternal Uncle        ? primary  . Kidney disease Brother   . Heart disease Sister   . Heart disease Sister   . Hyperlipidemia Neg Hx   . Hypertension Neg Hx   . Colon cancer Neg Hx     Social History Social History   Tobacco Use  . Smoking status: Former Smoker    Quit date: 01/06/1976    Years since quitting: 42.8  . Smokeless tobacco: Never Used  . Tobacco comment: smoked 35 years ago as of 2013   Substance Use Topics  . Alcohol use: No  . Drug use: No     Allergies   Prednisone   Review of Systems Review of Systems  Constitutional: Negative for chills and fever.  HENT: Negative for ear pain and sore throat.   Eyes: Negative for pain and visual disturbance.  Respiratory: Positive for shortness of breath. Negative for cough.   Cardiovascular: Positive for chest pain. Negative for palpitations.  Gastrointestinal: Negative for abdominal pain and  vomiting.  Genitourinary: Negative for dysuria and hematuria.  Musculoskeletal: Negative for arthralgias and back pain.  Skin: Negative for color change and rash.  Neurological: Negative for seizures and syncope.  All other systems reviewed and are negative.    Physical Exam Triage Vital Signs ED Triage Vitals  Enc Vitals Group     BP      Pulse      Resp      Temp      Temp src      SpO2      Weight      Height      Head Circumference      Peak Flow      Pain Score      Pain Loc      Pain Edu?      Excl. in [place]?    No data found.  Updated Vital Signs BP 133/86 (BP Location: Right Arm)   Pulse 64   Temp 98.4 F (36.9 C) (Oral)   Resp 16   SpO2 99%   Visual Acuity Right Eye Distance:   Left Eye Distance:   Bilateral Distance:    Right Eye Near:   Left Eye Near:    Bilateral Near:     Physical Exam Vitals signs and nursing note reviewed.  Constitutional:      Appearance: He is well-developed.  HENT:     Head: Normocephalic and atraumatic.     Mouth/Throat:     Mouth: Mucous membranes are moist.     Pharynx: Oropharynx is clear.  Eyes:     Conjunctiva/sclera: Conjunctivae normal.  Neck:     Musculoskeletal: Neck supple.  Cardiovascular:     Rate and Rhythm: Normal rate and regular rhythm.     Heart sounds: No murmur.  Pulmonary:     Effort: Pulmonary effort is normal. No respiratory distress.     Breath sounds: Normal breath sounds. No wheezing or rhonchi.  Abdominal:     Palpations: Abdomen is soft.     Tenderness: There is no abdominal tenderness. There is no guarding or rebound.  Skin:    General: Skin is warm and dry.  Neurological:     General: No focal deficit present.  Mental Status: He is alert and oriented to person, place, and time.     Sensory: No sensory deficit.     Motor: No weakness.     Gait: Gait normal.  Psychiatric:        Mood and Affect: Mood normal.        Behavior: Behavior normal.      UC Treatments / Results   Labs (all labs ordered are listed, but only abnormal results are displayed) Labs Reviewed - No data to display  EKG   Radiology No results found.  Procedures Procedures (including critical care time)  Medications Ordered in UC Medications - No data to display  Initial Impression / Assessment and Plan / UC Course  I have reviewed the triage vital signs and the nursing notes.  Pertinent labs & imaging results that were available during my care of the patient were reviewed by me and considered in my medical decision making (see chart for details).    Exertional chest pain and shortness of breath.  EKG shows sinus bradycardia with a rate of 57, no ST elevation, T wave inversion in lead III when compared to previous EKG.  Sending patient to the emergency department for evaluation.     Final Clinical Impressions(s) / UC Diagnoses   Final diagnoses:  Chest pain, unspecified type  Shortness of breath     Discharge Instructions     Go to the emergency department for evaluation of your shortness of breath with chest pain upon exertion.    ED Prescriptions    None     PDMP not reviewed this encounter.   Sharion Balloon, NP 10/24/18 1419

## 2018-10-24 NOTE — Telephone Encounter (Signed)
See below as Juluis Rainier

## 2018-10-24 NOTE — ED Notes (Signed)
Patient transported to CT 

## 2018-10-24 NOTE — ED Provider Notes (Signed)
[facility] EMERGENCY DEPARTMENT Provider Note   CSN: [id number] Arrival date & time: 10/24/18  1419     History   Chief Complaint Chief Complaint  Patient presents with  . Chest Pain    HPI [name] is a 63 y.o. male.     The history is provided by the patient.  Chest Pain Pain location:  Substernal area Pain quality: aching and burning   Pain radiates to:  Does not radiate Pain severity:  No pain Onset quality:  Gradual Timing:  Intermittent Progression:  Waxing and waning Chronicity:  New Context: not eating and not at rest   Relieved by:  Rest Worsened by:  Exertion Ineffective treatments:  None tried Associated symptoms: shortness of breath   Associated symptoms: no abdominal pain, no anxiety, no back pain, no cough, no fever, no nausea, no palpitations and no vomiting   Risk factors: diabetes mellitus, high cholesterol, hypertension and male sex   Risk factors: no coronary artery disease, no prior DVT/PE and no smoking     Past Medical History:  Diagnosis Date  . Carpal tunnel syndrome, bilateral   . Carpal tunnel syndrome, right    nerve impingement right arm/wears brace  . Diabetes mellitus    no meds  . GERD (gastroesophageal reflux disease)   . Gout   . Hepatic steatosis   . Hyperlipidemia   . Hypertension   . Obstructive sleep apnea     Patient Active Problem List   Diagnosis Date Noted  . 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 Diabetes mellitus ([place]) 02/08/2018  . Chronic kidney disease (CKD), stage III (moderate) 02/08/2018  . Tinea corporis 11/08/2017  . Chronic left hip pain 07/26/2017  . Facet arthropathy, lumbosacral 07/26/2017  . DDD (degenerative disc disease), lumbosacral 07/26/2017  . Attention deficit hyperactivity disorder (ADHD), predominantly inattentive type 07/26/2017  . 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 Overweight (BMI 25.0-29.9) 04/25/2017  . Noncompliance 03/24/2017  . Abdominal bloating 05/15/2015  . UTI (urinary tract infection) 04/20/2014  . Prostatitis 04/20/2014  . Hyperglycemia 04/20/2014  . Fatigue 03/07/2014  . Family history of early CAD 03/07/2014  . Maxillary sinusitis, acute 12/06/2013  . Tinea versicolor 12/06/2013  . Physical exam 11/06/2013  . 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 Mood disorder ([place]) 03/29/2013  . Other malaise and fatigue 03/29/2013  . Decreased libido 03/29/2013  . Hematuria 02/17/2013  . Right elbow pain 12/15/2012  . Pustular folliculitis 27/25/3664  . Left ankle pain 03/15/2012  . Hypertension associated with diabetes ([place]) 12/01/2011  . Renal insufficiency 12/01/2011  . SPINAL STENOSIS, LUMBAR 08/02/2008  . Vitamin D deficiency 11/30/2007  . GOUT 11/30/2007  . BPH (benign prostatic hyperplasia) 11/30/2007  . Diabetes mellitus with renal manifestation ([place]) 06/11/2006  . ANEMIA NEC 06/11/2006  . Chronic back pain greater than 3 months duration 06/11/2006  . Hyperlipidemia associated with type 2 diabetes mellitus ([place]) 05/26/2006  . OBSTRUCTIVE SLEEP APNEA 05/26/2006    Past Surgical History:  Procedure Laterality Date  . CARPAL TUNNEL RELEASE  09/10/2015   RIGHT WRIST / WITH NERVE IMPINGEMENT SURGERY  . CERVICAL FUSION     Dr Sherwood Gambler  . COLONOSCOPY  2007   negative; [facility]  . ESOPHAGEAL DILATION  2007  . 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 LUMBAR FUSION  Dr Sherwood Gambler        Home Medications    Prior to Admission medications   Medication Sig Start Date End Date Taking? Authorizing Provider  acetaminophen (TYLENOL) 500 MG tablet Take 1,000 mg by mouth daily.   Yes [provider]  allopurinol (ZYLOPRIM) 300 MG tablet TAKE 1/2 TABLET BY MOUTH DAILY Patient taking differently: Take 150 mg by mouth daily.  10/17/18  Yes Opalski, Neoma Laming,  DO  amphetamine-dextroamphetamine (ADDERALL) 10 MG tablet Take 1 tablet (10 mg total) by mouth daily with breakfast. 10/09/18 11/08/18 Yes Opalski, Neoma Laming, DO  Ascorbic Acid (VITAMIN C) 1000 MG tablet Take 1,000 mg by mouth daily.   Yes [provider]  aspirin 81 MG tablet Take 81 mg by mouth daily.     Yes [provider]  atorvastatin (LIPITOR) 40 MG tablet TAKE 1 TABLET BY MOUTH EACH NIGHT AT BEDTIME Patient taking differently: Take 40 mg by mouth at bedtime.  10/07/18  Yes Opalski, Deborah, DO  benazepril (LOTENSIN) 20 MG tablet TAKE 1 TABLET BY MOUTH DAILY Patient taking differently: Take 20 mg by mouth daily.  07/05/18  Yes Opalski, Deborah, DO  blood glucose meter kit and supplies Dispense based on patient and insurance preference. Use to check fasting blood glucose in the morning and to check glucose 2 hours after largest meal of the day. (FOR ICD-10 E10.9, E11.9). 01/13/17  Yes Opalski, Deborah, DO  FLUoxetine (PROZAC) 40 MG capsule TAKE 1 CAPSULE BY MOUTH DAILY Patient taking differently: Take 40 mg by mouth daily.  06/28/18  Yes Opalski, Neoma Laming, DO  gabapentin (NEURONTIN) 800 MG tablet Take 1 tablet (800 mg total) by mouth 3 (three) times daily. 05/06/18  Yes Opalski, Deborah, DO  glucose blood test strip Use as instructed 01/13/17  Yes Opalski, Deborah, DO  ketoconazole (NIZORAL) 2 % cream Apply 1 application topically 2 (two) times daily. To affected areas. 08/29/18  Yes Opalski, Neoma Laming, DO  LANCETS ULTRA FINE MISC Use to check fasting blood glucose in the morning and to check glucose 2 hours after largest meal of the day 01/13/17  Yes Opalski, Neoma Laming, DO  metFORMIN (GLUCOPHAGE-XR) 500 MG 24 hr tablet TAKE 1 TABLET BY MOUTH DAILY WITH BREAKFAST Patient taking differently: Take 500 mg by mouth daily with breakfast.  08/15/18  Yes Opalski, Deborah, DO  Multiple Vitamins-Iron (MULTIVITAMINS WITH IRON) TABS Take 1 tablet by mouth daily. 11/27/11  Yes Chatten, Carmen L, NP   MYRBETRIQ 25 MG TB24 tablet Take 25 mg by mouth daily.  05/15/16  Yes [provider]  pantoprazole (PROTONIX) 40 MG tablet TAKE 1 TABLET BY MOUTH DAILY Patient taking differently: Take 40 mg by mouth daily.  05/25/18  Yes Opalski, Deborah, DO  polyethylene glycol powder (GLYCOLAX/MIRALAX) powder USE AS DIRECTED Patient taking differently: Take 1 Container by mouth as directed.  07/17/14  Yes Midge Minium, MD  pyridoxine (B-6) 100 MG tablet Take 100 mg by mouth daily.   Yes [provider]  traMADol (ULTRAM) 50 MG tablet Take 50 mg by mouth as needed for pain. 10/17/18  Yes [provider]  traZODone (DESYREL) 50 MG tablet TAKE 1/2 TO 1 TABLET BY MOUTH AT [place] Patient taking differently: Take 25-50 mg by mouth at bedtime as needed for sleep.  10/07/18  Yes Opalski, Deborah, DO  Vitamin D, Ergocalciferol, (DRISDOL) 1.25 MG (50000 UT) CAPS capsule TAKE 1 CAPSULE BY MOUTH EVERY 7 DAYS Patient taking differently: Take 50,000 Units by mouth every 7 (  seven) days. Saturdays 03/01/18  Yes Opalski, Neoma Laming, DO  amphetamine-dextroamphetamine (ADDERALL) 10 MG tablet Take 1 tablet (10 mg total) by mouth daily with breakfast. Patient not taking: Reported on 10/24/2018 08/09/18 09/08/18  Mellody Dance, DO  amphetamine-dextroamphetamine (ADDERALL) 10 MG tablet Take 1 tablet (10 mg total) by mouth daily with breakfast. Patient not taking: Reported on 10/24/2018 09/09/18 10/24/18  Mellody Dance, DO  clotrimazole-betamethasone (LOTRISONE) cream Apply 1 application topically 2 (two) times daily. Patient not taking: Reported on 10/24/2018 11/08/17   Mellody Dance, DO  fluticasone [facility]) 50 MCG/ACT nasal spray Place 2 sprays into both nostrils daily. Patient not taking: Reported on 10/24/2018 12/23/15   Brunetta Jeans, PA-C    Family History Family History  Problem Relation Age of Onset  . Heart disease Father        pacer  . Urolithiasis Father   .  Heart attack Father   . Aneurysm Mother 4       CNS aneurysm  . Sudden death Mother   . Diabetes Sister   . Cancer Maternal Uncle        ? primary  . Cancer Paternal Uncle        ? primary  . Kidney disease Brother   . Heart disease Sister   . Heart disease Sister   . Hyperlipidemia Neg Hx   . Hypertension Neg Hx   . Colon cancer Neg Hx     Social History Social History   Tobacco Use  . Smoking status: Former Smoker    Quit date: 01/06/1976    Years since quitting: 42.8  . Smokeless tobacco: Never Used  . Tobacco comment: smoked 35 years ago as of 2013   Substance Use Topics  . Alcohol use: No  . Drug use: No     Allergies   Prednisone   Review of Systems Review of Systems  Constitutional: Negative for chills and fever.  HENT: Negative for ear pain and sore throat.   Eyes: Negative for pain and visual disturbance.  Respiratory: Positive for shortness of breath. Negative for cough.   Cardiovascular: Positive for chest pain. Negative for palpitations.  Gastrointestinal: Negative for abdominal pain, nausea and vomiting.  Genitourinary: Negative for dysuria and hematuria.  Musculoskeletal: Negative for arthralgias and back pain.  Skin: Negative for color change and rash.  Neurological: Negative for seizures and syncope.  All other systems reviewed and are negative.    Physical Exam Updated Vital Signs  ED Triage Vitals  Enc Vitals Group     BP 10/24/18 1425 (!) 144/89     Pulse Rate 10/24/18 1425 (!) 57     Resp 10/24/18 1425 16     Temp 10/24/18 1425 98.5 F (36.9 C)     Temp Source 10/24/18 1425 Oral     SpO2 10/24/18 1425 98 %     Weight 10/24/18 1426 225 lb (102.1 kg)     Height 10/24/18 1426 _0  (1.803 m)     Head Circumference --      Peak Flow --      Pain Score 10/24/18 1549 0     Pain Loc --      Pain Edu? --      Excl. in [place]? --     Physical Exam Vitals signs and nursing note reviewed.  Constitutional:      Appearance: He is  well-developed.  HENT:     Head: Normocephalic and atraumatic.  Eyes:     Conjunctiva/sclera: Conjunctivae  normal.     Pupils: Pupils are equal, round, and reactive to light.  Neck:     Musculoskeletal: Normal range of motion and neck supple.  Cardiovascular:     Rate and Rhythm: Normal rate and regular rhythm.     Pulses:          Radial pulses are 2+ on the right side and 2+ on the left side.     Heart sounds: Normal heart sounds. No murmur.  Pulmonary:     Effort: Pulmonary effort is normal. No respiratory distress.     Breath sounds: Normal breath sounds. No decreased breath sounds, wheezing, rhonchi or rales.  Abdominal:     Palpations: Abdomen is soft.     Tenderness: There is no abdominal tenderness.  Musculoskeletal: Normal range of motion.     Right lower leg: No edema.     Left lower leg: No edema.  Skin:    General: Skin is warm and dry.     Capillary Refill: Capillary refill takes less than 2 seconds.  Neurological:     General: No focal deficit present.     Mental Status: He is alert.      ED Treatments / Results  Labs (all labs ordered are listed, but only abnormal results are displayed) Labs Reviewed  BASIC METABOLIC PANEL - Abnormal; Notable for the following components:      Result Value   BUN 29 (*)    Creatinine, Ser 1.77 (*)    GFR calc non Af Amer 40 (*)    GFR calc Af Amer 46 (*)    All other components within normal limits  CBC - Abnormal; Notable for the following components:   Hemoglobin 12.3 (*)    HCT 37.8 (*)    All other components within normal limits  D-DIMER, QUANTITATIVE (NOT AT [facility]) - Abnormal; Notable for the following components:   D-Dimer, Quant 0.72 (*)    All other components within normal limits  HEPATIC FUNCTION PANEL - Abnormal; Notable for the following components:   Total Protein 8.2 (*)    All other components within normal limits  SARS CORONAVIRUS 2 (TAT 6-24 HRS)  LIPASE, BLOOD  TROPONIN I (HIGH SENSITIVITY)   TROPONIN I (HIGH SENSITIVITY)    EKG EKG Interpretation  Date/Time:  Monday October 24 2018 14:26:22 EDT Ventricular Rate:  56 PR Interval:  150 QRS Duration: 82 QT Interval:  396 QTC Calculation: 382 R Axis:   23 Text Interpretation:  Sinus bradycardia Otherwise normal ECG Confirmed by Lennice Sites [id number]) on 10/24/2018 3:48:29 PM   Radiology Dg Chest 2 View  Result Date: 10/24/2018 CLINICAL DATA:  Chest pain EXAM: CHEST - 2 VIEW COMPARISON:  04/14/2014 FINDINGS: The heart size and mediastinal contours are within normal limits. Both lungs are clear. Disc degenerative disease of the thoracic spine. IMPRESSION: No acute abnormality of the lungs. Electronically Signed   By: Eddie Candle M.D.   On: 10/24/2018 14:39   Ct Angio Chest Pe W And/or Wo Contrast  Result Date: 10/24/2018 CLINICAL DATA:  Chest pain. EXAM: CT ANGIOGRAPHY CHEST WITH CONTRAST TECHNIQUE: Multidetector CT imaging of the chest was performed using the standard protocol during bolus administration of intravenous contrast. Multiplanar CT image reconstructions and MIPs were obtained to evaluate the vascular anatomy. CONTRAST:  141m OMNIPAQUE IOHEXOL 350 MG/ML SOLN COMPARISON:  None. FINDINGS: Cardiovascular: Satisfactory opacification of the pulmonary arteries to the segmental level. No evidence of pulmonary embolism. Normal heart size. No pericardial effusion. Coronary  artery calcifications are noted. Mediastinum/Nodes: No enlarged mediastinal, hilar, or axillary lymph nodes. Thyroid gland, trachea, and esophagus demonstrate no significant findings. Lungs/Pleura: Lungs are clear. No pleural effusion or pneumothorax. Upper Abdomen: No acute abnormality. Musculoskeletal: No chest wall abnormality. No acute or significant osseous findings. Review of the MIP images confirms the above findings. IMPRESSION: No definite evidence of pulmonary embolus. Coronary artery calcifications are noted suggesting coronary artery disease. No  other abnormality seen in the chest. Electronically Signed   By: Marijo Conception M.D.   On: 10/24/2018 20:35    Procedures Procedures (including critical care time)  Medications Ordered in ED Medications  alum & mag hydroxide-simeth (MAALOX/MYLANTA) 200-200-20 MG/5ML suspension 30 mL (30 mLs Oral Given 10/24/18 1608)    And  lidocaine (XYLOCAINE) 2 % viscous mouth solution 15 mL (15 mLs Oral Given 10/24/18 1608)  iohexol (OMNIPAQUE) 350 MG/ML injection 100 mL (100 mLs Intravenous Contrast Given 10/24/18 2003)     Initial Impression / Assessment and Plan / ED Course  I have reviewed the triage vital signs and the nursing notes.  Pertinent labs & imaging results that were available during my care of the patient were reviewed by me and considered in my medical decision making (see chart for details).     [name] is a 63 year old male with history of hypertension, high cholesterol, diabetes controlled with diet who presents the ED with chest pain, shortness of breath.  Symptoms on and off for the past month.  Patient with unremarkable vitals.  No fever.  No infectious symptoms.  No symptoms at rest.  Patient mostly describes the pain as a burning pain.  Does not typically associate it with eating.  Does not have any history of cardiac disease.  No history or risk factors for PE.  Patient has a heart score of 4.  Patient does not have any abdominal pain.  EKG shows sinus bradycardia with no ischemic changes.  Unchanged from prior.  Troponin negative x2.  D-dimer mildly elevated and will get CT scan to evaluate for PE.  Chest x-ray showed no signs of pneumonia, no pneumothorax, no pleural effusion.  Patient with no significant leukocytosis, anemia, electrolyte abnormality, new kidney injury.  Awaiting CT scan to further rule out PE.  Patient does have elevated heart score but reassuring troponins.  May discuss need for observation stay with hospitalist.  Patient with CT scan of chest that  showed no PE but does show coronary artery calcifications.  Given elevated heart score, exertional chest pain, coronary artery calcifications will admit for further ACS rule out.  Hemodynamically stable throughout my care.  Chest pain-free throughout my care.  This chart was dictated using voice recognition software.  Despite best efforts to proofread,  errors can occur which can change the documentation meaning.    Final Clinical Impressions(s) / ED Diagnoses     Final diagnoses:  Chest pain, unspecified type    ED Discharge Orders    None       Lennice Sites, DO 10/24/18 2123

## 2018-10-24 NOTE — H&P (Signed)
History and Physical    [name] WER:[id number] DOB: 1955/06/25 DOA: 10/24/2018  PCP: Mellody Dance, DO  Patient coming from: Home  I have personally briefly reviewed patient's old medical records in [place]  Chief Complaint: Chest pain  HPI: [name] is a 63 y.o. male with medical history significant for hypertension, hyperlipidemia, Type II diabetes, CKD stage III, ADHD, DDD with chronic back pain, depression, and OSA who presents to the ED for evaluation of chest pain.  Patient states that over the last month he has been having intermittent central chest discomfort described as a burning sensation which occurs with exertion.  He has associated shortness of breath.  Episodes last for a few minutes and resolve on their own when he stops for rest.  Denies any associated nausea, vomiting, diaphoresis, palpitations, radiation of pain, lightheadedness, dizziness.  He denies any similar episodes in the past.  He did undergo Myoview stress test in April 2016 due to significant family history of heart disease (open heart surgery in 2 sisters, ages 81s and 89s: Father with stents and bypass) and reported early CAD and abnormal EKG at that time.  He says he was not having chest pain at that time.  Myoview and echocardiogram were unremarkable.  He currently takes aspirin 81 mg daily.  He denies any NSAID use.  Reports a remote history of tobacco use of 1-1.5 PPD for 4-5 years, quitting about 40 years ago.  He denies any alcohol use or illicit drug use.  ED Course:  Initial vitals showed BP 133/86, pulse 64, RR 16, temp 98.4 Fahrenheit, SPO2 99% on room air.  Labs notable for WBC 4.2, hemoglobin 12.3, platelets 209,000, BUN 29, creatinine 1.77, EGFR 46, high-sensitivity troponin I 4 and 5, lipase 37, LFTs within normal limits.  D-dimer 0.72.  2 view chest x-ray was negative for focal consolidation, edema, or effusion.  CTA chest PE study was negative for evidence of pulmonary  embolus.  Coronary artery calcifications are noted.  Patient was given a GI cocktail.  Due to heart score of 4 the hospitalist service was consulted to admit for further evaluation and management.  Review of Systems: All systems reviewed and are negative except as documented in history of present illness above.   Past Medical History:  Diagnosis Date  . Carpal tunnel syndrome, bilateral   . Carpal tunnel syndrome, right    nerve impingement right arm/wears brace  . Diabetes mellitus    no meds  . GERD (gastroesophageal reflux disease)   . Gout   . Hepatic steatosis   . Hyperlipidemia   . Hypertension   . Obstructive sleep apnea     Past Surgical History:  Procedure Laterality Date  . CARPAL TUNNEL RELEASE  09/10/2015   RIGHT WRIST / WITH NERVE IMPINGEMENT SURGERY  . CERVICAL FUSION     Dr Sherwood Gambler  . COLONOSCOPY  2007   negative; [facility]  . ESOPHAGEAL DILATION  2007  . 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 LUMBAR FUSION      Dr Sherwood Gambler    Social History:  reports that he quit smoking about 42 years ago. He has never used smokeless tobacco. He reports that he does not drink alcohol or use drugs.  Allergies  Allergen Reactions  . Prednisone Other (See Comments)    Caused blood sugar to increase, pt will not take    Family History  Problem Relation Age of Onset  .  Heart disease Father        pacer  . Urolithiasis Father   . Heart attack Father   . Aneurysm Mother 32       CNS aneurysm  . Sudden death Mother   . Diabetes Sister   . Cancer Maternal Uncle        ? primary  . Cancer Paternal Uncle        ? primary  . Kidney disease Brother   . Heart disease Sister   . Heart disease Sister   . Hyperlipidemia Neg Hx   . Hypertension Neg Hx   . Colon cancer Neg Hx      Prior to Admission medications   Medication Sig Start Date End Date Taking? Authorizing Provider  acetaminophen (TYLENOL) 500 MG tablet Take 1,000 mg  by mouth daily.   Yes [provider]  allopurinol (ZYLOPRIM) 300 MG tablet TAKE 1/2 TABLET BY MOUTH DAILY Patient taking differently: Take 150 mg by mouth daily.  10/17/18  Yes Opalski, Neoma Laming, DO  amphetamine-dextroamphetamine (ADDERALL) 10 MG tablet Take 1 tablet (10 mg total) by mouth daily with breakfast. 10/09/18 11/08/18 Yes Opalski, Neoma Laming, DO  Ascorbic Acid (VITAMIN C) 1000 MG tablet Take 1,000 mg by mouth daily.   Yes [provider]  aspirin 81 MG tablet Take 81 mg by mouth daily.     Yes [provider]  atorvastatin (LIPITOR) 40 MG tablet TAKE 1 TABLET BY MOUTH EACH NIGHT AT BEDTIME Patient taking differently: Take 40 mg by mouth at bedtime.  10/07/18  Yes Opalski, Deborah, DO  benazepril (LOTENSIN) 20 MG tablet TAKE 1 TABLET BY MOUTH DAILY Patient taking differently: Take 20 mg by mouth daily.  07/05/18  Yes Opalski, Deborah, DO  blood glucose meter kit and supplies Dispense based on patient and insurance preference. Use to check fasting blood glucose in the morning and to check glucose 2 hours after largest meal of the day. (FOR ICD-10 E10.9, E11.9). 01/13/17  Yes Opalski, Deborah, DO  FLUoxetine (PROZAC) 40 MG capsule TAKE 1 CAPSULE BY MOUTH DAILY Patient taking differently: Take 40 mg by mouth daily.  06/28/18  Yes Opalski, Neoma Laming, DO  gabapentin (NEURONTIN) 800 MG tablet Take 1 tablet (800 mg total) by mouth 3 (three) times daily. 05/06/18  Yes Opalski, Deborah, DO  glucose blood test strip Use as instructed 01/13/17  Yes Opalski, Deborah, DO  ketoconazole (NIZORAL) 2 % cream Apply 1 application topically 2 (two) times daily. To affected areas. 08/29/18  Yes Opalski, Neoma Laming, DO  LANCETS ULTRA FINE MISC Use to check fasting blood glucose in the morning and to check glucose 2 hours after largest meal of the day 01/13/17  Yes Opalski, Neoma Laming, DO  metFORMIN (GLUCOPHAGE-XR) 500 MG 24 hr tablet TAKE 1 TABLET BY MOUTH DAILY WITH BREAKFAST Patient taking differently:  Take 500 mg by mouth daily with breakfast.  08/15/18  Yes Opalski, Deborah, DO  Multiple Vitamins-Iron (MULTIVITAMINS WITH IRON) TABS Take 1 tablet by mouth daily. 11/27/11  Yes Chatten, Carmen L, NP  MYRBETRIQ 25 MG TB24 tablet Take 25 mg by mouth daily.  05/15/16  Yes [provider]  pantoprazole (PROTONIX) 40 MG tablet TAKE 1 TABLET BY MOUTH DAILY Patient taking differently: Take 40 mg by mouth daily.  05/25/18  Yes Opalski, Deborah, DO  polyethylene glycol powder (GLYCOLAX/MIRALAX) powder USE AS DIRECTED Patient taking differently: Take 1 Container by mouth as directed.  07/17/14  Yes Midge Minium, MD  pyridoxine (B-6) 100 MG tablet  Take 100 mg by mouth daily.   Yes [provider]  traMADol (ULTRAM) 50 MG tablet Take 50 mg by mouth as needed for pain. 10/17/18  Yes [provider]  traZODone (DESYREL) 50 MG tablet TAKE 1/2 TO 1 TABLET BY MOUTH AT [place] Patient taking differently: Take 25-50 mg by mouth at bedtime as needed for sleep.  10/07/18  Yes Opalski, Deborah, DO  Vitamin D, Ergocalciferol, (DRISDOL) 1.25 MG (50000 UT) CAPS capsule TAKE 1 CAPSULE BY MOUTH EVERY 7 DAYS Patient taking differently: Take 50,000 Units by mouth every 7 (seven) days. Saturdays 03/01/18  Yes Opalski, Neoma Laming, DO  amphetamine-dextroamphetamine (ADDERALL) 10 MG tablet Take 1 tablet (10 mg total) by mouth daily with breakfast. Patient not taking: Reported on 10/24/2018 08/09/18 09/08/18  Mellody Dance, DO  amphetamine-dextroamphetamine (ADDERALL) 10 MG tablet Take 1 tablet (10 mg total) by mouth daily with breakfast. Patient not taking: Reported on 10/24/2018 09/09/18 10/24/18  Mellody Dance, DO  clotrimazole-betamethasone (LOTRISONE) cream Apply 1 application topically 2 (two) times daily. Patient not taking: Reported on 10/24/2018 11/08/17   Mellody Dance, DO  fluticasone [facility]) 50 MCG/ACT nasal spray Place 2 sprays into both nostrils daily. Patient not  taking: Reported on 10/24/2018 12/23/15   Brunetta Jeans, [place]    Physical Exam: Vitals:   10/24/18 1900 10/24/18 1915 10/24/18 1945 10/24/18 2209  BP: (!) 133/95 139/84 (!) 142/97 (!) 147/91  Pulse: (!) 52 (!) 52 (!) 56 (!) 58  Resp: '19 16 17 18  ' Temp:      TempSrc:      SpO2: 95% 98% 97% 96%  Weight:      Height:        Constitutional: Resting supine in bed, NAD, calm, comfortable Eyes: PERRL, lids and conjunctivae normal ENMT: Mucous membranes are moist. Posterior pharynx clear of any exudate or lesions.Normal dentition.  Neck: normal, supple, no masses. Respiratory: clear to auscultation bilaterally, no wheezing, no crackles. Normal respiratory effort. No accessory muscle use.  Cardiovascular: Regular rate and rhythm, no murmurs / rubs / gallops. No extremity edema. 2+ pedal pulses. Abdomen: no tenderness, no masses palpated. No hepatosplenomegaly. Bowel sounds positive.  Musculoskeletal: no clubbing / cyanosis. No joint deformity upper and lower extremities. Good ROM, no contractures. Normal muscle tone.  Skin: no rashes, lesions, ulcers. No induration Neurologic: CN 2-12 grossly intact. Sensation intact, Strength 5/5 in all 4.  Psychiatric: Normal judgment and insight. Alert and oriented x 3. Normal mood.     Labs on Admission: I have personally reviewed following labs and imaging studies  CBC: Recent Labs  Lab 10/24/18 1431  WBC 4.2  HGB 12.3*  HCT 37.8*  MCV 85.1  PLT 161   Basic Metabolic Panel: Recent Labs  Lab 10/24/18 1431  NA 136  K 4.9  CL 103  CO2 25  GLUCOSE 97  BUN 29*  CREATININE 1.77*  CALCIUM 9.8   GFR: Estimated Creatinine Clearance: 52 mL/min (A) (by C-G formula based on SCr of 1.77 mg/dL (H)). Liver Function Tests: Recent Labs  Lab 10/24/18 1638  AST 41  ALT 28  ALKPHOS 73  BILITOT 0.8  PROT 8.2*  ALBUMIN 4.4   Recent Labs  Lab 10/24/18 1638  LIPASE 37   No results for input(s): AMMONIA in the last 168 hours.  Coagulation Profile: No results for input(s): INR, PROTIME in the last 168 hours. Cardiac Enzymes: No results for input(s): CKTOTAL, CKMB, CKMBINDEX, TROPONINI in the last 168 hours. BNP (last  3 results) No results for input(s): PROBNP in the last 8760 hours. HbA1C: No results for input(s): HGBA1C in the last 72 hours. CBG: No results for input(s): GLUCAP in the last 168 hours. Lipid Profile: No results for input(s): CHOL, HDL, LDLCALC, TRIG, CHOLHDL, LDLDIRECT in the last 72 hours. Thyroid Function Tests: No results for input(s): TSH, T4TOTAL, FREET4, T3FREE, THYROIDAB in the last 72 hours. Anemia Panel: No results for input(s): VITAMINB12, FOLATE, FERRITIN, TIBC, IRON, RETICCTPCT in the last 72 hours. Urine analysis:    Component Value Date/Time   LABSPEC 1.015 11/27/2011 1911   PHURINE 6.0 11/27/2011 1911   GLUCOSEU NEGATIVE 11/27/2011 1911   HGBUR NEGATIVE 11/27/2011 1911   BILIRUBINUR Neg 04/20/2014 1518   KETONESUR NEGATIVE 11/27/2011 1911   PROTEINUR 15 04/20/2014 1518   PROTEINUR NEGATIVE 11/27/2011 1911   UROBILINOGEN 0.2 04/20/2014 1518   UROBILINOGEN 0.2 11/27/2011 1911   NITRITE Neg 04/20/2014 1518   NITRITE NEGATIVE 11/27/2011 1911   LEUKOCYTESUR Trace 04/20/2014 1518    Radiological Exams on Admission: Dg Chest 2 View  Result Date: 10/24/2018 CLINICAL DATA:  Chest pain EXAM: CHEST - 2 VIEW COMPARISON:  04/14/2014 FINDINGS: The heart size and mediastinal contours are within normal limits. Both lungs are clear. Disc degenerative disease of the thoracic spine. IMPRESSION: No acute abnormality of the lungs. Electronically Signed   By: Eddie Candle M.D.   On: 10/24/2018 14:39   Ct Angio Chest Pe W And/or Wo Contrast  Result Date: 10/24/2018 CLINICAL DATA:  Chest pain. EXAM: CT ANGIOGRAPHY CHEST WITH CONTRAST TECHNIQUE: Multidetector CT imaging of the chest was performed using the standard protocol during bolus administration of intravenous contrast. Multiplanar CT  image reconstructions and MIPs were obtained to evaluate the vascular anatomy. CONTRAST:  132m OMNIPAQUE IOHEXOL 350 MG/ML SOLN COMPARISON:  None. FINDINGS: Cardiovascular: Satisfactory opacification of the pulmonary arteries to the segmental level. No evidence of pulmonary embolism. Normal heart size. No pericardial effusion. Coronary artery calcifications are noted. Mediastinum/Nodes: No enlarged mediastinal, hilar, or axillary lymph nodes. Thyroid gland, trachea, and esophagus demonstrate no significant findings. Lungs/Pleura: Lungs are clear. No pleural effusion or pneumothorax. Upper Abdomen: No acute abnormality. Musculoskeletal: No chest wall abnormality. No acute or significant osseous findings. Review of the MIP images confirms the above findings. IMPRESSION: No definite evidence of pulmonary embolus. Coronary artery calcifications are noted suggesting coronary artery disease. No other abnormality seen in the chest. Electronically Signed   By: JMarijo ConceptionM.D.   On: 10/24/2018 20:35    EKG: Independently reviewed.  Sinus bradycardia, rate 56, no acute ischemic changes.  When compared to prior in 2016, PVC no longer seen.  Assessment/Plan Principal Problem:   Chest pain Active Problems:   Diabetes mellitus with renal manifestation (HCC)   Hyperlipidemia associated with type 2 diabetes mellitus ([place]   OBSTRUCTIVE SLEEP APNEA   Hypertension associated with diabetes ([place]   Chronic kidney disease (CKD), stage III (moderate)  [name] Doleckiis a 63y.o. male with medical history significant for hypertension, hyperlipidemia, Type II diabetes, CKD stage III, ADHD, DDD with chronic back pain, depression, and OSA who is admitted for chest pain evaluation.   Atypical chest pain: History suggestive of upper GI symptoms however he reports these episodes occur only with exertion and associated with dyspnea.  Had normal Myoview stress test April 2016.  Troponins negative x2.  EKG with sinus  bradycardia.  CTA was negative for PE but did show coronary calcification.  He reports a strong family  history of heart disease and has personal risk factors in HTN, HLD, and diabetes. -Monitor on telemetry -Repeat troponin, if rising continue to trend -EKG as needed -Echocardiogram -Trial of Protonix -Continue aspirin 81 mg daily -Continue home atorvastatin, check lipid panel -Will keep n.p.o. at midnight  Hypertension: Resume home benazepril.  Hyperlipidemia: Continue atorvastatin  Type 2 diabetes: A1c 6.6 on 08/30/2018.  On metformin as an outpatient. -Will use sensitive SSI while in hospital, hold Metformin  CKD stage III: Appears stable relative to recent labs.  Will hydrate with IV fluids overnight given contrast study in ED.  Depression: Continue fluoxetine.  Chronic back pain: Continue gabapentin and as needed tramadol.  OSA: Continue CPAP at night.  DVT prophylaxis: Subcutaneous heparin Code Status: Full code, confirmed with patient Family Communication: Discussed with wife at bedside Disposition Plan: Pending clinical progress Consults called: None Admission status: Observation   Zada Finders MD [facility]  If 7PM-7AM, please contact night-coverage [email or web address]  10/24/2018, 10:18 PM

## 2018-10-24 NOTE — Telephone Encounter (Signed)
Patient's wife called, stating that patient started experiencing shortness of breath & chest pain a few days ago, I advised patient to be checked out at the ED.

## 2018-10-24 NOTE — ED Notes (Signed)
Patient is being discharged from the Urgent [facility] and sent to the Emergency Department via wheelchair by staff. Per Claiborne Billings, patient is stable but in need of higher level of care due to CP on exertion with EKG changes. Pt is in no pain at present, EKG obtained during visit. Patient is aware and verbalizes understanding of plan of care.  Vitals:   10/24/18 1348  BP: 133/86  Pulse: 64  Resp: 16  Temp: 98.4 F (36.9 C)  SpO2: 99%

## 2018-10-24 NOTE — ED Triage Notes (Signed)
Pt here from urgent care where he went for cp and exertional shob x 1 one month. EKG is changed from last so was wheeled here by [facility] staff.

## 2018-10-25 ENCOUNTER — Encounter (HOSPITAL_COMMUNITY): Payer: Self-pay | Admitting: General Practice

## 2018-10-25 ENCOUNTER — Observation Stay (HOSPITAL_COMMUNITY): Payer: PPO

## 2018-10-25 ENCOUNTER — Encounter (HOSPITAL_COMMUNITY)
Admission: EM | Disposition: A | Discharge status: Home / Self Care | Payer: Self-pay | Source: Home / Self Care | Attending: Thoracic Surgery (Cardiothoracic Vascular Surgery)

## 2018-10-25 ENCOUNTER — Observation Stay (HOSPITAL_BASED_OUTPATIENT_CLINIC_OR_DEPARTMENT_OTHER): Payer: PPO

## 2018-10-25 DIAGNOSIS — N1831 Chronic kidney disease, stage 3a: Secondary | ICD-10-CM | POA: Diagnosis not present

## 2018-10-25 DIAGNOSIS — E785 Hyperlipidemia, unspecified: Secondary | ICD-10-CM

## 2018-10-25 DIAGNOSIS — I25110 Atherosclerotic heart disease of native coronary artery with unstable angina pectoris: Secondary | ICD-10-CM

## 2018-10-25 DIAGNOSIS — Z01810 Encounter for preprocedural cardiovascular examination: Secondary | ICD-10-CM

## 2018-10-25 DIAGNOSIS — R079 Chest pain, unspecified: Secondary | ICD-10-CM

## 2018-10-25 DIAGNOSIS — E1159 Type 2 diabetes mellitus with other circulatory complications: Secondary | ICD-10-CM | POA: Diagnosis not present

## 2018-10-25 DIAGNOSIS — I200 Unstable angina: Secondary | ICD-10-CM

## 2018-10-25 DIAGNOSIS — G4733 Obstructive sleep apnea (adult) (pediatric): Secondary | ICD-10-CM | POA: Diagnosis not present

## 2018-10-25 DIAGNOSIS — E1169 Type 2 diabetes mellitus with other specified complication: Secondary | ICD-10-CM | POA: Diagnosis not present

## 2018-10-25 DIAGNOSIS — I10 Essential (primary) hypertension: Secondary | ICD-10-CM

## 2018-10-25 DIAGNOSIS — E1129 Type 2 diabetes mellitus with other diabetic kidney complication: Secondary | ICD-10-CM | POA: Diagnosis not present

## 2018-10-25 HISTORY — PX: CORONARY ANGIOGRAPHY: CATH118303

## 2018-10-25 LAB — LIPID PANEL
Cholesterol: 158 mg/dL (ref 0–200)
HDL: 47 mg/dL (ref >40)
LDL Cholesterol: 90 mg/dL (ref 0–99)
Total CHOL/HDL Ratio: 3.4 RATIO
Triglycerides: 104 mg/dL (ref <150)
VLDL: 21 mg/dL (ref 0–40)

## 2018-10-25 LAB — CBC
HCT: 35.5 % — ABNORMAL LOW (ref 39.0–52.0)
Hemoglobin: 11.8 g/dL — ABNORMAL LOW (ref 13.0–17.0)
MCH: 27.8 pg (ref 26.0–34.0)
MCHC: 33.2 g/dL (ref 30.0–36.0)
MCV: 83.7 fL (ref 80.0–100.0)
Platelets: 213 10*3/uL (ref 150–400)
RBC: 4.24 MIL/uL (ref 4.22–5.81)
RDW: 13.8 % (ref 11.5–15.5)
WBC: 4.9 10*3/uL (ref 4.0–10.5)
nRBC: 0 % (ref 0.0–0.2)

## 2018-10-25 LAB — URINALYSIS, ROUTINE W REFLEX MICROSCOPIC
Bilirubin Urine: NEGATIVE
Glucose, UA: NEGATIVE mg/dL
Hgb urine dipstick: NEGATIVE
Ketones, ur: NEGATIVE mg/dL
Leukocytes,Ua: NEGATIVE
Nitrite: NEGATIVE
Protein, ur: NEGATIVE mg/dL
Specific Gravity, Urine: 1.011 (ref 1.005–1.030)
pH: 6 (ref 5.0–8.0)

## 2018-10-25 LAB — GLUCOSE, CAPILLARY
Glucose-Capillary: 122 mg/dL — ABNORMAL HIGH (ref 70–99)
Glucose-Capillary: 97 mg/dL (ref 70–99)
Glucose-Capillary: 101 mg/dL — ABNORMAL HIGH (ref 70–99)

## 2018-10-25 LAB — BLOOD GAS, ARTERIAL
Acid-base deficit: 0.2 mmol/L (ref 0.0–2.0)
Bicarbonate: 24.4 mmol/L (ref 20.0–28.0)
Drawn by: 345601
FIO2: 21
O2 Saturation: 91.9 %
Patient temperature: 98.6
pCO2 arterial: 43 mmHg (ref 32.0–48.0)
pH, Arterial: 7.371 (ref 7.350–7.450)
pO2, Arterial: 66.6 mmHg — ABNORMAL LOW (ref 83.0–108.0)

## 2018-10-25 LAB — TROPONIN I (HIGH SENSITIVITY): Troponin I (High Sensitivity): 7 ng/L (ref <18)

## 2018-10-25 LAB — BASIC METABOLIC PANEL
Anion gap: 10 (ref 5–15)
BUN: 27 mg/dL — ABNORMAL HIGH (ref 8–23)
CO2: 25 mmol/L (ref 22–32)
Calcium: 9.4 mg/dL (ref 8.9–10.3)
Chloride: 105 mmol/L (ref 98–111)
Creatinine, Ser: 1.65 mg/dL — ABNORMAL HIGH (ref 0.61–1.24)
GFR calc Af Amer: 50 mL/min — ABNORMAL LOW (ref >60)
GFR calc non Af Amer: 44 mL/min — ABNORMAL LOW (ref >60)
Glucose, Bld: 98 mg/dL (ref 70–99)
Potassium: 4.1 mmol/L (ref 3.5–5.1)
Sodium: 140 mmol/L (ref 135–145)

## 2018-10-25 LAB — HEPARIN LEVEL (UNFRACTIONATED): Heparin Unfractionated: 0.52 IU/mL (ref 0.30–0.70)

## 2018-10-25 LAB — ECHOCARDIOGRAM COMPLETE
Height: 71 in
Weight: 3532.8 oz

## 2018-10-25 LAB — SARS CORONAVIRUS 2 (TAT 6-24 HRS): SARS Coronavirus 2: NEGATIVE

## 2018-10-25 LAB — PREPARE RBC (CROSSMATCH)

## 2018-10-25 LAB — SURGICAL PCR SCREEN
MRSA, PCR: NEGATIVE
Staphylococcus aureus: NEGATIVE

## 2018-10-25 LAB — HIV ANTIBODY (ROUTINE TESTING W REFLEX): HIV Screen 4th Generation wRfx: NONREACTIVE

## 2018-10-25 SURGERY — CORONARY ANGIOGRAPHY (CATH LAB)
Anesthesia: LOCAL

## 2018-10-25 MED ORDER — CHLORHEXIDINE GLUCONATE CLOTH 2 % EX PADS
6.0000 | MEDICATED_PAD | Freq: Once | CUTANEOUS | Status: AC
  Administered 2018-10-25: 23:00:00 6 via TOPICAL

## 2018-10-25 MED ORDER — SODIUM CHLORIDE 0.9 % IV SOLN
1.5000 g | INTRAVENOUS | Status: AC
  Administered 2018-10-26: 1.5 g via INTRAVENOUS
  Filled 2018-10-25 (×2): qty 1.5

## 2018-10-25 MED ORDER — SODIUM CHLORIDE 0.9% FLUSH: 3.0000 mL | Freq: Two times a day (BID) | INTRAVENOUS | Status: DC

## 2018-10-25 MED ORDER — PHENYLEPHRINE HCL-NACL 20-0.9 MG/250ML-% IV SOLN
30.0000 ug/min | INTRAVENOUS | Status: AC
  Administered 2018-10-26: 35 ug/min via INTRAVENOUS
  Filled 2018-10-25 (×2): qty 250

## 2018-10-25 MED ORDER — LIDOCAINE HCL (PF) 1 % IJ SOLN
INTRAMUSCULAR | Status: AC
  Filled 2018-10-25: qty 30

## 2018-10-25 MED ORDER — VERAPAMIL HCL 2.5 MG/ML IV SOLN
INTRAVENOUS | Status: AC
  Filled 2018-10-25: qty 2

## 2018-10-25 MED ORDER — SODIUM CHLORIDE 0.9% FLUSH
3.0000 mL | Freq: Two times a day (BID) | INTRAVENOUS | Status: DC
  Administered 2018-10-25: 3 mL via INTRAVENOUS

## 2018-10-25 MED ORDER — MIDAZOLAM HCL 2 MG/2ML IJ SOLN
INTRAMUSCULAR | Status: AC
  Filled 2018-10-25: qty 2

## 2018-10-25 MED ORDER — BISACODYL 5 MG PO TBEC: 5.0000 mg | DELAYED_RELEASE_TABLET | Freq: Once | ORAL | Status: DC

## 2018-10-25 MED ORDER — VANCOMYCIN HCL 10 G IV SOLR
1500.0000 mg | INTRAVENOUS | Status: AC
  Administered 2018-10-26: 1500 mg via INTRAVENOUS
  Filled 2018-10-25 (×2): qty 1500

## 2018-10-25 MED ORDER — SODIUM CHLORIDE 0.9 % IV SOLN: 250.0000 mL | INTRAVENOUS | Status: DC | PRN

## 2018-10-25 MED ORDER — SODIUM CHLORIDE 0.9 % WEIGHT BASED INFUSION
1.0000 mL/kg/h | INTRAVENOUS | Status: DC
  Administered 2018-10-25: 1 mL/kg/h via INTRAVENOUS

## 2018-10-25 MED ORDER — TRANEXAMIC ACID 1000 MG/10ML IV SOLN
1.5000 mg/kg/h | INTRAVENOUS | Status: AC
  Administered 2018-10-26: 1.5 mg/kg/h via INTRAVENOUS
  Filled 2018-10-25 (×2): qty 25

## 2018-10-25 MED ORDER — MANNITOL 20 % IV SOLN
INTRAVENOUS | Status: DC
  Filled 2018-10-25 (×2): qty 13

## 2018-10-25 MED ORDER — FENTANYL CITRATE (PF) 100 MCG/2ML IJ SOLN
INTRAMUSCULAR | Status: AC
  Filled 2018-10-25: qty 2

## 2018-10-25 MED ORDER — ATORVASTATIN CALCIUM 80 MG PO TABS
80.0000 mg | ORAL_TABLET | Freq: Every day | ORAL | Status: DC
  Administered 2018-10-25 – 2018-10-30 (×6): 80 mg via ORAL
  Filled 2018-10-25 (×6): qty 1

## 2018-10-25 MED ORDER — HEPARIN (PORCINE) IN NACL 1000-0.9 UT/500ML-% IV SOLN
INTRAVENOUS | Status: AC
  Filled 2018-10-25: qty 1000

## 2018-10-25 MED ORDER — HEPARIN (PORCINE) IN NACL 1000-0.9 UT/500ML-% IV SOLN
INTRAVENOUS | Status: DC | PRN
  Administered 2018-10-25 (×2): 500 mL

## 2018-10-25 MED ORDER — FENTANYL CITRATE (PF) 100 MCG/2ML IJ SOLN
INTRAMUSCULAR | Status: DC | PRN
  Administered 2018-10-25: 25 ug via INTRAVENOUS

## 2018-10-25 MED ORDER — CHLORHEXIDINE GLUCONATE 0.12 % MT SOLN
15.0000 mL | Freq: Once | OROMUCOSAL | Status: AC
  Administered 2018-10-26: 06:00:00 15 mL via OROMUCOSAL
  Filled 2018-10-25: qty 15

## 2018-10-25 MED ORDER — TRANEXAMIC ACID (OHS) BOLUS VIA INFUSION
15.0000 mg/kg | INTRAVENOUS | Status: AC
  Administered 2018-10-26: 1503 mg via INTRAVENOUS
  Filled 2018-10-25 (×2): qty 1503

## 2018-10-25 MED ORDER — LABETALOL HCL 5 MG/ML IV SOLN: 10.0000 mg | INTRAVENOUS | Status: AC | PRN

## 2018-10-25 MED ORDER — POTASSIUM CHLORIDE 2 MEQ/ML IV SOLN
80.0000 meq | INTRAVENOUS | Status: DC
  Filled 2018-10-25 (×2): qty 40

## 2018-10-25 MED ORDER — NITROGLYCERIN IN D5W 200-5 MCG/ML-% IV SOLN
2.0000 ug/min | INTRAVENOUS | Status: DC
  Filled 2018-10-25 (×2): qty 250

## 2018-10-25 MED ORDER — IOHEXOL 350 MG/ML SOLN
INTRAVENOUS | Status: DC | PRN
  Administered 2018-10-25: 35 mL via INTRA_ARTERIAL

## 2018-10-25 MED ORDER — SODIUM CHLORIDE 0.9 % WEIGHT BASED INFUSION
3.0000 mL/kg/h | INTRAVENOUS | Status: AC
  Administered 2018-10-25: 3 mL/kg/h via INTRAVENOUS

## 2018-10-25 MED ORDER — METOPROLOL TARTRATE 12.5 MG HALF TABLET
12.5000 mg | ORAL_TABLET | Freq: Once | ORAL | Status: AC
  Administered 2018-10-26: 12.5 mg via ORAL
  Filled 2018-10-25: qty 1

## 2018-10-25 MED ORDER — SODIUM CHLORIDE 0.9 % IV SOLN
750.0000 mg | INTRAVENOUS | Status: DC
  Filled 2018-10-25 (×2): qty 750

## 2018-10-25 MED ORDER — SODIUM CHLORIDE 0.9% FLUSH: 3.0000 mL | INTRAVENOUS | Status: DC | PRN

## 2018-10-25 MED ORDER — HEPARIN (PORCINE) 25000 UT/250ML-% IV SOLN
1150.0000 [IU]/h | INTRAVENOUS | Status: DC
  Administered 2018-10-25: 1150 [IU]/h via INTRAVENOUS
  Filled 2018-10-25: qty 250

## 2018-10-25 MED ORDER — TRANEXAMIC ACID (OHS) PUMP PRIME SOLUTION
2.0000 mg/kg | INTRAVENOUS | Status: DC
  Filled 2018-10-25: qty 2

## 2018-10-25 MED ORDER — HEPARIN SODIUM (PORCINE) 1000 UNIT/ML IJ SOLN
INTRAMUSCULAR | Status: DC | PRN
  Administered 2018-10-25: 5000 [IU] via INTRAVENOUS

## 2018-10-25 MED ORDER — INSULIN REGULAR(HUMAN) IN NACL 100-0.9 UT/100ML-% IV SOLN
INTRAVENOUS | Status: AC
  Administered 2018-10-26: 1.6 [IU]/h via INTRAVENOUS
  Filled 2018-10-25 (×2): qty 100

## 2018-10-25 MED ORDER — MIDAZOLAM HCL 2 MG/2ML IJ SOLN
INTRAMUSCULAR | Status: DC | PRN
  Administered 2018-10-25: 2 mg via INTRAVENOUS

## 2018-10-25 MED ORDER — VERAPAMIL HCL 2.5 MG/ML IV SOLN
INTRAVENOUS | Status: DC | PRN
  Administered 2018-10-25: 10 mL via INTRA_ARTERIAL

## 2018-10-25 MED ORDER — CHLORHEXIDINE GLUCONATE CLOTH 2 % EX PADS
6.0000 | MEDICATED_PAD | Freq: Once | CUTANEOUS | Status: AC
  Administered 2018-10-26: 6 via TOPICAL

## 2018-10-25 MED ORDER — HEPARIN SODIUM (PORCINE) 1000 UNIT/ML IJ SOLN
INTRAMUSCULAR | Status: AC
  Filled 2018-10-25: qty 1

## 2018-10-25 MED ORDER — MILRINONE LACTATE IN DEXTROSE 20-5 MG/100ML-% IV SOLN
0.3000 ug/kg/min | INTRAVENOUS | Status: DC
  Filled 2018-10-25 (×2): qty 100

## 2018-10-25 MED ORDER — HYDRALAZINE HCL 20 MG/ML IJ SOLN: 10.0000 mg | INTRAMUSCULAR | Status: AC | PRN

## 2018-10-25 MED ORDER — DOPAMINE-DEXTROSE 3.2-5 MG/ML-% IV SOLN
0.0000 ug/kg/min | INTRAVENOUS | Status: DC
  Filled 2018-10-25 (×2): qty 250

## 2018-10-25 MED ORDER — EPINEPHRINE HCL 5 MG/250ML IV SOLN IN NS
0.0000 ug/min | INTRAVENOUS | Status: DC
  Filled 2018-10-25 (×2): qty 250

## 2018-10-25 MED ORDER — DEXMEDETOMIDINE HCL IN NACL 400 MCG/100ML IV SOLN
0.1000 ug/kg/h | INTRAVENOUS | Status: AC
  Administered 2018-10-26: .5 ug/kg/h via INTRAVENOUS
  Filled 2018-10-25 (×2): qty 100

## 2018-10-25 MED ORDER — TEMAZEPAM 15 MG PO CAPS
15.0000 mg | ORAL_CAPSULE | Freq: Once | ORAL | Status: AC | PRN
  Administered 2018-10-25: 15 mg via ORAL
  Filled 2018-10-25: qty 1

## 2018-10-25 MED ORDER — PLASMA-LYTE 148 IV SOLN
INTRAVENOUS | Status: AC
  Administered 2018-10-26: 08:00:00 500 mL
  Filled 2018-10-25 (×2): qty 2.5

## 2018-10-25 MED ORDER — SODIUM CHLORIDE 0.9 % IV SOLN
INTRAVENOUS | Status: DC
  Filled 2018-10-25 (×2): qty 30

## 2018-10-25 MED ORDER — NITROGLYCERIN IN D5W 200-5 MCG/ML-% IV SOLN
0.0000 ug/min | INTRAVENOUS | Status: DC
  Administered 2018-10-25: 5 ug/min via INTRAVENOUS
  Filled 2018-10-25 (×2): qty 250

## 2018-10-25 MED ORDER — LIDOCAINE HCL (PF) 1 % IJ SOLN
INTRAMUSCULAR | Status: DC | PRN
  Administered 2018-10-25: 2 mL via INTRADERMAL

## 2018-10-25 MED ORDER — SODIUM CHLORIDE 0.9 % WEIGHT BASED INFUSION
150.0000 mL/h | INTRAVENOUS | Status: DC
  Administered 2018-10-25: 13:00:00 150 mL/h via INTRAVENOUS

## 2018-10-25 SURGICAL SUPPLY — 11 items
CATH 5FR JL3.5 JR4 ANG PIG MP (CATHETERS) ×1 IMPLANT
CATH LAUNCHER 5F JR4 (CATHETERS) ×1 IMPLANT
DEVICE RAD COMP TR BAND LRG (VASCULAR PRODUCTS) ×1 IMPLANT
GLIDESHEATH SLEND SS 6F .021 (SHEATH) ×1 IMPLANT
GUIDEWIRE INQWIRE 1.5J.035X260 (WIRE) IMPLANT
INQWIRE 1.5J .035X260CM (WIRE) ×2
KIT HEART LEFT (KITS) ×2 IMPLANT
KIT HEMO VALVE WATCHDOG (MISCELLANEOUS) ×1 IMPLANT
PACK CARDIAC CATHETERIZATION (CUSTOM PROCEDURE TRAY) ×2 IMPLANT
TRANSDUCER W/STOPCOCK (MISCELLANEOUS) ×2 IMPLANT
TUBING CIL FLEX 10 FLL-RA (TUBING) ×2 IMPLANT

## 2018-10-25 NOTE — Consult Note (Signed)
[address]       [place] [address]             [id number]        [name] [facility] #[id number] Date of Birth: 1955-07-01  Referring: No ref. provider found Primary Care: Mellody Dance, DO Primary Cardiologist:No primary care provider on file.  Chief Complaint:    Chief Complaint  Patient presents with  . Chest Pain    History of Present Illness:     63 yo male admitted with progressive chest pain.  He underwent a LHC which showed severe LM disease.   He states that he chest pain has progressed over the last several weeks.  He currently is unable to do small tasks without symptoms   Past Medical and Surgical History: Previous Chest Surgery: no Previous Chest Radiation: no Diabetes Mellitus: yes.  HbA1C 6.6 Creatinine: 1.7  Past Medical History:  Diagnosis Date  . Carpal tunnel syndrome, bilateral   . Carpal tunnel syndrome, right    nerve impingement right arm/wears brace  . Diabetes mellitus    no meds  . Dyspnea   . GERD (gastroesophageal reflux disease)   . Gout   . Hepatic steatosis   . Hyperlipidemia   . Hypertension   . Obstructive sleep apnea     Past Surgical History:  Procedure Laterality Date  . CARPAL TUNNEL RELEASE  09/10/2015   RIGHT WRIST / WITH NERVE IMPINGEMENT SURGERY  . CERVICAL FUSION     Dr Sherwood Gambler  . COLONOSCOPY  2007   negative; [facility]  . ESOPHAGEAL DILATION  2007  . 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 LUMBAR FUSION      Dr Sherwood Gambler    Social History: Support: wife at the bedside  Social History   Tobacco Use  Smoking Status Former Smoker  . Quit date: 01/06/1976  . Years since quitting: 42.8  Smokeless Tobacco Never Used  Tobacco Comment   smoked 35 years ago as of 2013     Social History   Substance and Sexual Activity  Alcohol Use No     Allergies  Allergen Reactions  . Prednisone Other (See Comments)    Caused blood  sugar to increase, pt will not take      Current Facility-Administered Medications  Medication Dose Route Frequency Provider Last Rate Last Dose  . [START ON 10/26/2018] 0.9 %  sodium chloride infusion  250 mL Intravenous PRN Sherren Mocha, MD      . 0.9% sodium chloride infusion  1 mL/kg/hr Intravenous Continuous Sherren Mocha, MD 100.2 mL/hr at 10/25/18 1657 1 mL/kg/hr at 10/25/18 1657  . acetaminophen (TYLENOL) tablet 650 mg  650 mg Oral Q4H PRN Lenore Cordia, MD      . aspirin EC tablet 81 mg  81 mg Oral Daily Lenore Cordia, MD   81 mg at 10/25/18 0932  . atorvastatin (LIPITOR) tablet 80 mg  80 mg Oral QHS Reino Bellis B, NP   80 mg at 10/25/18 2054  . [START ON 10/26/2018] cefUROXime (ZINACEF) 1.5 g in sodium chloride 0.9 % 100 mL IVPB  1.5 g Intravenous To OR Hammons, Theone Murdoch, RPH      . [START ON 10/26/2018] cefUROXime (ZINACEF) 750 mg in sodium chloride 0.9 % 100 mL IVPB  750 mg Intravenous To OR Hammons, Theone Murdoch, RPH      . [START ON 10/26/2018]  chlorhexidine (PERIDEX) 0.12 % solution 15 mL  15 mL Mouth/Throat Once Corrie Reder O, MD      . Chlorhexidine Gluconate Cloth 2 % PADS 6 each  6 each Topical Once Adonica Fukushima, Lucile Crater, MD       And  . Derrill Memo ON 10/26/2018] Chlorhexidine Gluconate Cloth 2 % PADS 6 each  6 each Topical Once Lajuana Matte, MD      . Derrill Memo ON 10/26/2018] dexmedetomidine (PRECEDEX) 400 MCG/100ML (4 mcg/mL) infusion  0.1-0.7 mcg/kg/hr Intravenous To OR Hammons, Theone Murdoch, RPH      . [START ON 10/26/2018] DOPamine (INTROPIN) 800 mg in dextrose 5 % 250 mL (3.2 mg/mL) infusion  0-10 mcg/kg/min Intravenous To OR Hammons, Theone Murdoch, RPH      . [START ON 10/26/2018] EPINEPHrine (ADRENALIN) 4 mg in NS 250 mL (0.016 mg/mL) premix infusion  0-10 mcg/min Intravenous To OR Hammons, Theone Murdoch, RPH      . FLUoxetine (PROZAC) capsule 40 mg  40 mg Oral Daily Lenore Cordia, MD   40 mg at 10/25/18 0932  . gabapentin (NEURONTIN) capsule 800 mg   800 mg Oral TID Lenore Cordia, MD   800 mg at 10/25/18 2053  . [START ON 10/26/2018] heparin 2,500 Units, papaverine 30 mg in electrolyte-148 (PLASMALYTE-148) 500 mL irrigation   Irrigation To OR Hammons, Theone Murdoch, RPH      . [START ON 10/26/2018] heparin 30,000 units/NS 1000 mL solution for CELLSAVER   Other To OR Hammons, Theone Murdoch, RPH      . heparin ADULT infusion 100 units/mL (25000 units/236m sodium chloride 0.45%)  1,150 Units/hr Intravenous Continuous JDomenic Polite MD 11.5 mL/hr at 10/25/18 1650 1,150 Units/hr at 10/25/18 1650  . [START ON 10/26/2018] insulin regular, human (MYXREDLIN) 100 units/ 100 mL infusion   Intravenous To OR Hammons, KTheone Murdoch RPH      . [START ON 10/26/2018] [facility] Blood Cardioplegia vial (lidocaine/magnesium/mannitol 0.26g-4g-6.4g)   Intracoronary To OR Hammons, KTheone Murdoch RPH      . [START ON 10/26/2018] metoprolol tartrate (LOPRESSOR) tablet 12.5 mg  12.5 mg Oral Once LLajuana Matte MD      . [Derrill MemoON 10/26/2018] milrinone (PRIMACOR) 20 MG/100 ML (0.2 mg/mL) infusion  0.3 mcg/kg/min Intravenous To OR Hammons, Kimberly B, RPH      . nitroGLYCERIN 50 mg in dextrose 5 % 250 mL (0.2 mg/mL) infusion  0-200 mcg/min Intravenous Continuous RReino BellisB, NP 1.5 mL/hr at 10/25/18 1720 5 mcg/min at 10/25/18 1720  . [START ON 10/26/2018] nitroGLYCERIN 50 mg in dextrose 5 % 250 mL (0.2 mg/mL) infusion  2-200 mcg/min Intravenous To OR Hammons, Kimberly B, RPH      . ondansetron (ZOFRAN) injection 4 mg  4 mg Intravenous Q6H PRN PZada FindersR, MD      . pantoprazole (PROTONIX) EC tablet 20 mg  20 mg Oral Daily PZada FindersR, MD   20 mg at 10/25/18 0931  . [START ON 10/26/2018] phenylephrine (NEOSYNEPHRINE) 20-0.9 MG/250ML-% infusion  30-200 mcg/min Intravenous To OR Hammons, KTheone Murdoch RPH      . [START ON 10/26/2018] potassium chloride injection 80 mEq  80 mEq Other To OR Hammons, Kimberly B, RPH      . sodium chloride flush (NS) 0.9 % injection 3  mL  3 mL Intravenous Q12H RReino BellisB, NP   3 mL at 10/25/18 1130  . [START ON 10/26/2018] sodium chloride flush (NS) 0.9 % injection 3 mL  3 mL Intravenous Q12H  Sherren Mocha, MD      . Derrill Memo ON 10/26/2018] sodium chloride flush (NS) 0.9 % injection 3 mL  3 mL Intravenous PRN Sherren Mocha, MD      . temazepam (RESTORIL) capsule 15 mg  15 mg Oral Once PRN Lajuana Matte, MD      . traMADol (ULTRAM) tablet 50 mg  50 mg Oral Q12H PRN Lenore Cordia, MD      . Derrill Memo ON 10/26/2018] tranexamic acid (CYKLOKAPRON) 2,500 mg in sodium chloride 0.9 % 250 mL (10 mg/mL) infusion  1.5 mg/kg/hr Intravenous To OR Hammons, Theone Murdoch, RPH      . [START ON 10/26/2018] tranexamic acid (CYKLOKAPRON) bolus via infusion - over 30 minutes 1,503 mg  15 mg/kg Intravenous To OR Hammons, Theone Murdoch, RPH      . [START ON 10/26/2018] tranexamic acid (CYKLOKAPRON) pump prime solution 200 mg  2 mg/kg Intracatheter To OR Hammons, Theone Murdoch, RPH      . [START ON 10/26/2018] vancomycin (VANCOCIN) 1,500 mg in sodium chloride 0.9 % 250 mL IVPB  1,500 mg Intravenous To OR Hammons, Kimberly B, RPH        Medications Prior to Admission  Medication Sig Dispense Refill Last Dose  . acetaminophen (TYLENOL) 500 MG tablet Take 1,000 mg by mouth daily.   10/24/2018 at Unknown time  . allopurinol (ZYLOPRIM) 300 MG tablet TAKE 1/2 TABLET BY MOUTH DAILY (Patient taking differently: Take 150 mg by mouth daily. ) 45 tablet 0 10/24/2018 at Unknown time  . amphetamine-dextroamphetamine (ADDERALL) 10 MG tablet Take 1 tablet (10 mg total) by mouth daily with breakfast. 30 tablet 0 10/24/2018 at Unknown time  . Ascorbic Acid (VITAMIN C) 1000 MG tablet Take 1,000 mg by mouth daily.   10/24/2018 at Unknown time  . aspirin 81 MG tablet Take 81 mg by mouth daily.     10/24/2018 at Unknown time  . atorvastatin (LIPITOR) 40 MG tablet TAKE 1 TABLET BY MOUTH EACH NIGHT AT BEDTIME (Patient taking differently: Take 40 mg by mouth at  bedtime. ) 90 tablet 1 10/23/2018 at Unknown time  . benazepril (LOTENSIN) 20 MG tablet TAKE 1 TABLET BY MOUTH DAILY (Patient taking differently: Take 20 mg by mouth daily. ) 90 tablet 1 10/24/2018 at Unknown time  . blood glucose meter kit and supplies Dispense based on patient and insurance preference. Use to check fasting blood glucose in the morning and to check glucose 2 hours after largest meal of the day. (FOR ICD-10 E10.9, E11.9). 1 each 0   . FLUoxetine (PROZAC) 40 MG capsule TAKE 1 CAPSULE BY MOUTH DAILY (Patient taking differently: Take 40 mg by mouth daily. ) 90 capsule 1 10/24/2018 at Unknown time  . gabapentin (NEURONTIN) 800 MG tablet Take 1 tablet (800 mg total) by mouth 3 (three) times daily. 270 tablet 1 10/24/2018 at Unknown time  . glucose blood test strip Use as instructed 100 each 12   . ketoconazole (NIZORAL) 2 % cream Apply 1 application topically 2 (two) times daily. To affected areas. 60 g 1 Past Month at Unknown time  . LANCETS ULTRA FINE MISC Use to check fasting blood glucose in the morning and to check glucose 2 hours after largest meal of the day 100 each 12   . metFORMIN (GLUCOPHAGE-XR) 500 MG 24 hr tablet TAKE 1 TABLET BY MOUTH DAILY WITH BREAKFAST (Patient taking differently: Take 500 mg by mouth daily with breakfast. ) 90 tablet 1 10/24/2018 at Unknown time  .  Multiple Vitamins-Iron (MULTIVITAMINS WITH IRON) TABS Take 1 tablet by mouth daily. 30 tablet 0 10/24/2018 at Unknown time  . MYRBETRIQ 25 MG TB24 tablet Take 25 mg by mouth daily.    10/24/2018 at Unknown time  . pantoprazole (PROTONIX) 40 MG tablet TAKE 1 TABLET BY MOUTH DAILY (Patient taking differently: Take 40 mg by mouth daily. ) 90 tablet 1 10/24/2018 at Unknown time  . polyethylene glycol powder (GLYCOLAX/MIRALAX) powder USE AS DIRECTED (Patient taking differently: Take 1 Container by mouth as directed. ) 850 g 11 10/24/2018 at Unknown time  . pyridoxine (B-6) 100 MG tablet Take 100 mg by mouth daily.    10/24/2018 at Unknown time  . traMADol (ULTRAM) 50 MG tablet Take 50 mg by mouth as needed for pain.   10/23/2018 at Unknown time  . traZODone (DESYREL) 50 MG tablet TAKE 1/2 TO 1 TABLET BY MOUTH AT [facility] NEEDED FOR SLEEP (Patient taking differently: Take 25-50 mg by mouth at bedtime as needed for sleep. ) 120 tablet 1 10/23/2018 at Unknown time  . Vitamin D, Ergocalciferol, (DRISDOL) 1.25 MG (50000 UT) CAPS capsule TAKE 1 CAPSULE BY MOUTH EVERY 7 DAYS (Patient taking differently: Take 50,000 Units by mouth every 7 (seven) days. Saturdays) 12 capsule 10 10/22/2018  . amphetamine-dextroamphetamine (ADDERALL) 10 MG tablet Take 1 tablet (10 mg total) by mouth daily with breakfast. (Patient not taking: Reported on 10/24/2018) 30 tablet 0 Not Taking at Unknown time    Family History  Problem Relation Age of Onset  . Heart disease Father        pacer  . Urolithiasis Father   . Heart attack Father   . Aneurysm Mother 40       CNS aneurysm  . Sudden death Mother   . Diabetes Sister   . Cancer Maternal Uncle        ? primary  . Cancer Paternal Uncle        ? primary  . Kidney disease Brother   . Heart disease Sister   . Heart disease Sister   . Hyperlipidemia Neg Hx   . Hypertension Neg Hx   . Colon cancer Neg Hx      Review of Systems:   Review of Systems  Constitutional: Positive for malaise/fatigue.  HENT: Negative.   Eyes: Negative.   Respiratory: Positive for shortness of breath.   Cardiovascular: Positive for chest pain and orthopnea.  Gastrointestinal: Negative.   Musculoskeletal: Negative.   Skin: Negative.   Neurological: Negative.       Physical Exam: BP 116/65 (BP Location: Left Arm)   Pulse (!) 59   Temp 98.6 F (37 C) (Oral)   Resp (!) 22   Ht '5\' 11"'  (1.803 m)   Wt 100.2 kg   SpO2 97%   BMI 30.80 kg/m  Physical Exam  Constitutional: He is oriented to person, place, and time. No distress.  Eyes: Conjunctivae are normal. Right eye exhibits no discharge.  Left eye exhibits no discharge.  Neck: Normal range of motion. No tracheal deviation present.  Cardiovascular: Regular rhythm.  No murmur heard. bradycardic  Pulmonary/Chest: Effort normal. No respiratory distress.  Abdominal: Soft. He exhibits no distension.  Neurological: He is alert and oriented to person, place, and time.  Skin: Skin is warm and dry. He is not diaphoretic.      Diagnostic Studies & Laboratory data:       I have independently reviewed the above radiologic studies and discussed with the patient  Recent Lab Findings: Lab Results  Component Value Date   WBC 4.9 10/25/2018   HGB 11.8 (L) 10/25/2018   HCT 35.5 (L) 10/25/2018   PLT 213 10/25/2018   GLUCOSE 98 10/25/2018   CHOL 158 10/25/2018   TRIG 104 10/25/2018   HDL 47 10/25/2018   LDLCALC 90 10/25/2018   ALT 28 10/24/2018   AST 41 10/24/2018   NA 140 10/25/2018   K 4.1 10/25/2018   CL 105 10/25/2018   CREATININE 1.65 (H) 10/25/2018   BUN 27 (H) 10/25/2018   CO2 25 10/25/2018   TSH 3.370 02/09/2018   INR 0.9 RATIO 12/25/2005   HGBA1C 6.6 (H) 08/30/2018      Assessment / Plan:   63 yo male with LM CAD Preserved EF, no valvular disease  OR tomorrow for CABG 2     I  spent 30 minutes counseling the patient face to face.   Lajuana Matte 10/25/2018 10:32 PM

## 2018-10-25 NOTE — Progress Notes (Signed)
ANTICOAGULATION CONSULT NOTE - Follow Up Consult  Pharmacy Consult for heparin Indication: CAD awaiting CABG  Labs: Recent Labs    10/24/18 1431 10/24/18 1624 10/24/18 2341 10/25/18 0320 10/25/18 2253  HGB 12.3*  --   --  11.8*  --   HCT 37.8*  --   --  35.5*  --   PLT 209  --   --  213  --   HEPARINUNFRC  --   --   --   --  0.52  CREATININE 1.77*  --   --  1.65*  --   TROPONINIHS 4 5 7   --   --     Assessment/Plan:  63yo male therapeutic on heparin with initial dosing while awaiting CABG. Will continue gtt at current rate until off for OR.   Wynona Neat, PharmD, BCPS  10/25/2018,11:53 PM

## 2018-10-25 NOTE — Procedures (Signed)
Patient declined use of our CPAP tonight.  Family to bring in his home machine for tomorrow if he stays.

## 2018-10-25 NOTE — Procedures (Signed)
Patient declined CPAP tonight

## 2018-10-25 NOTE — Progress Notes (Signed)
  Echocardiogram 2D Echocardiogram has been performed.  [name] 10/25/2018, 9:17 AM

## 2018-10-25 NOTE — Interval H&P Note (Signed)
Cath Lab Visit (complete for each Cath Lab visit)  Clinical Evaluation Leading to the Procedure:   ACS: Yes.    Non-ACS:    Anginal Classification: CCS IV  Anti-ischemic medical therapy: No Therapy  Non-Invasive Test Results: No non-invasive testing performed  Prior CABG: No previous CABG      History and Physical Interval Note:  10/25/2018 2:28 PM  [name]  has presented today for surgery, with the diagnosis of chest pain.  The various methods of treatment have been discussed with the patient and family. After consideration of risks, benefits and other options for treatment, the patient has consented to  Procedure(s): LEFT HEART CATH AND CORONARY ANGIOGRAPHY (N/A) as a surgical intervention.  The patient's history has been reviewed, patient examined, no change in status, stable for surgery.  I have reviewed the patient's chart and labs.  Questions were answered to the patient's satisfaction.     Sherren Mocha

## 2018-10-25 NOTE — Consult Note (Addendum)
The patient has been seen in conjunction with Reino Bellis, NP. All aspects of care have been considered and discussed. The patient has been personally interviewed, examined, and all clinical data has been reviewed.   Acute coronary syndrome, Crescendo Angina --> needs cath. No LVGram needed but LVEDP will be useful.  CKD stage 3 (eGFR 50). Unfortunately ACE given this AM but will place on hold now. Should get 3cc/kg over 1 hour then 150 cc/hr thereafter.  + Risk Factors: Fam hx. CAD -> father and 2 sisters, DM II, Htn., Hyperlipidemia, and OSA.  The patient was counseled to undergo left heart catheterization, coronary angiography, and possible percutaneous coronary intervention with stent implantation. The procedural risks and benefits were discussed in detail. The risks discussed included death, stroke, myocardial infarction, life-threatening bleeding, limb ischemia, kidney injury, allergy, and possible emergency cardiac surgery. The risk of these significant complications were estimated to occur less than 1% of the time. After discussion, the patient has agreed to proceed.     Cardiology Consultation:   Patient ID: [name] MRN: [id number]; DOB: 1955/01/15  Admit date: 10/24/2018 Date of Consult: 10/25/2018  Primary Care Provider: Mellody Dance, DO Primary Cardiologist: No primary care provider on file.  Primary Electrophysiologist:  None    Patient Profile:   [name] is a 63 y.o. male with a hx of HTN, HL, DM, OSA, gout who is being seen today for the evaluation of chest pain at the request of Dr. Broadus John.  History of Present Illness:   [name] is a 63 yo male with PMH noted above. He was seen by Dr. Johnsie Cancel in 2016 for evaluation of chest pain with strong family hx of CAD. Reports sister with CABG in her 11s, mother with MI at 42, and father with CABG in his 14s. He was sent for a exercise stress test showing no evidence of scarring, EF noted at 46%.  Recommended follow up echo which showed 60-65% with no WMA.   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  States prior to Covid him and his wife are going to the gym on a regular basis and would often walk a mile even before starting exercise.  Did not experience any exertional symptoms with this activity.  Doing simple activities around his home he does not have exertional symptoms.  Has noticed over the past month with work he started to experience symptoms.  Most recently when he pulls his trash cans out to the road, about 150 feet walk he experiences centralized chest tightness and shortness of breath.  States he has been working to remodel the rooms in his home and has noticed with lifting his equipment he gets exertional chest tightness.  On Saturday states he had worked in the last room in his house he has been remodeling and did not experience any symptoms, but packed all of his tools and went to lift them when he experienced significant chest tightness.  Stated this was the most severe episode he had had up to this point. Rated at 10/10. Also felt short of breath with it.  He used inhaler without much relief.  Symptoms did eventually resolve with rest. Did not seek care at that time.   His primary care office yesterday with symptoms but was directed to the ER for further evaluation. He presented to the [facility] urgent care 10/19 with the symptoms evaluated and sent to the ED for further evaluation.  In  ED his labs showed stable electrolytes, Cr 1.77, Hgb 12.3, Ddimer 0.72, HsT 4>>5. CXR negative. CT angio negative for PE, but noted coronary calcifications. EKG showed SB with isolated TWI in lead III. Echo today with normal EF and no rWMA noted.  ° °Heart Pathway Score:     °Past Medical History:  °Diagnosis Date  °• Carpal tunnel syndrome, bilateral   °• Carpal tunnel syndrome, right   ° nerve impingement right arm/wears brace  °• Diabetes mellitus   ° no meds  °•  Dyspnea   °• GERD (gastroesophageal reflux disease)   °• Gout   °• Hepatic steatosis   °• Hyperlipidemia   °• Hypertension   °• Obstructive sleep apnea   ° ° °Past Surgical History:  °Procedure Laterality Date  °• CARPAL TUNNEL RELEASE  09/10/2015  ° RIGHT WRIST / WITH NERVE IMPINGEMENT SURGERY  °• CERVICAL FUSION    ° Dr Nudelman  °• COLONOSCOPY  2007  ° negative; [facility]  °• ESOPHAGEAL DILATION  2007  °• 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 Authorizing Provider  °acetaminophen (TYLENOL) 500 MG tablet Take 1,000 mg by mouth daily.   Yes [provider]  °allopurinol (ZYLOPRIM) 300 MG tablet TAKE 1/2 TABLET BY MOUTH DAILY °Patient taking differently: Take 150 mg by mouth daily.  10/17/18  Yes Opalski, Deborah, DO  °amphetamine-dextroamphetamine (ADDERALL) 10 MG tablet Take 1 tablet (10 mg total) by mouth daily with breakfast. 10/09/18 11/08/18 Yes Opalski, Deborah, DO  °Ascorbic Acid (VITAMIN C) 1000 MG tablet Take 1,000 mg by mouth daily.   Yes [provider]  °aspirin 81 MG tablet Take 81 mg by mouth daily.     Yes [provider]  °atorvastatin (LIPITOR) 40 MG tablet TAKE 1 TABLET BY MOUTH EACH NIGHT AT BEDTIME °Patient taking differently: Take 40 mg by mouth at bedtime.  10/07/18  Yes Opalski, Deborah, DO  °benazepril (LOTENSIN) 20 MG tablet TAKE 1 TABLET BY MOUTH DAILY °Patient taking differently: Take 20 mg by mouth daily.  07/05/18  Yes Opalski, Deborah, DO  °blood glucose meter kit and supplies Dispense based on patient and insurance preference. Use to check fasting blood glucose in the morning and to check glucose 2 hours after largest meal of the day. (FOR ICD-10 E10.9, E11.9). 01/13/17  Yes Opalski, Deborah, DO  °FLUoxetine (PROZAC) 40 MG capsule TAKE 1 CAPSULE BY MOUTH DAILY °Patient taking differently: Take  40 mg by mouth daily.  06/28/18  Yes Opalski, Deborah, DO  °gabapentin (NEURONTIN) 800 MG tablet Take 1 tablet (800 mg total) by mouth 3 (three) times daily. 05/06/18  Yes Opalski, Deborah, DO  °glucose blood test strip Use as instructed 01/13/17  Yes Opalski, Deborah, DO  °ketoconazole (NIZORAL) 2 % cream Apply 1 application topically 2 (two) times daily. To affected areas. 08/29/18  Yes Opalski, Deborah, DO  °LANCETS ULTRA FINE MISC Use to check fasting blood glucose in the morning and to check glucose 2 hours after largest meal of the day 01/13/17  Yes Opalski, Deborah, DO  °metFORMIN (GLUCOPHAGE-XR) 500 MG 24 hr tablet TAKE 1 TABLET BY MOUTH DAILY WITH BREAKFAST °Patient taking differently: Take 500 mg by mouth daily with breakfast.  08/15/18  Yes Opalski, Deborah, DO  °Multiple Vitamins-Iron (MULTIVITAMINS WITH IRON) TABS Take 1   tablet by mouth daily. 11/27/11  Yes Chatten, Carmen L, NP  °MYRBETRIQ 25 MG TB24 tablet Take 25 mg by mouth daily.  05/15/16  Yes [provider]  °pantoprazole (PROTONIX) 40 MG tablet TAKE 1 TABLET BY MOUTH DAILY °Patient taking differently: Take 40 mg by mouth daily.  05/25/18  Yes Opalski, Deborah, DO  °polyethylene glycol powder (GLYCOLAX/MIRALAX) powder USE AS DIRECTED °Patient taking differently: Take 1 Container by mouth as directed.  07/17/14  Yes Tabori, Katherine E, MD  °pyridoxine (B-6) 100 MG tablet Take 100 mg by mouth daily.   Yes [provider]  °traMADol (ULTRAM) 50 MG tablet Take 50 mg by mouth as needed for pain. 10/17/18  Yes [provider]  °traZODone (DESYREL) 50 MG tablet TAKE 1/2 TO 1 TABLET BY MOUTH AT BEDTIMEAS NEEDED FOR SLEEP °Patient taking differently: Take 25-50 mg by mouth at bedtime as needed for sleep.  10/07/18  Yes Opalski, Deborah, DO  °Vitamin D, Ergocalciferol, (DRISDOL) 1.25 MG (50000 UT) CAPS capsule TAKE 1 CAPSULE BY MOUTH EVERY 7 DAYS °Patient taking differently: Take 50,000 Units by mouth every 7 (seven) days. Saturdays 03/01/18   Yes Opalski, Deborah, DO  °amphetamine-dextroamphetamine (ADDERALL) 10 MG tablet Take 1 tablet (10 mg total) by mouth daily with breakfast. °Patient not taking: Reported on 10/24/2018 08/09/18 09/08/18  Opalski, Deborah, DO  ° ° °Inpatient Medications: °Scheduled Meds: °• aspirin EC  81 mg Oral Daily  °• atorvastatin  40 mg Oral QHS  °• benazepril  20 mg Oral Daily  °• FLUoxetine  40 mg Oral Daily  °• gabapentin  800 mg Oral TID  °• heparin  5,000 Units Subcutaneous Q8H  °• pantoprazole  20 mg Oral Daily  ° °Continuous Infusions: ° °PRN Meds: °acetaminophen, ondansetron (ZOFRAN) IV, traMADol ° °Allergies:    °Allergies  °Allergen Reactions  °• Prednisone Other (See Comments)  °  Caused blood sugar to increase, pt will not take  ° ° °Social History:   °Social History  ° °Socioeconomic History  °• Marital status: Married  °  Spouse name: Not on file  °• Number of children: Not on file  °• Years of education: Not on file  °• Highest education level: Not on file  °Occupational History  °• Not on file  °Social Needs  °• Financial resource strain: Not on file  °• Food insecurity  °  Worry: Not on file  °  Inability: Not on file  °• Transportation needs  °  Medical: Not on file  °  Non-medical: Not on file  °Tobacco Use  °• Smoking status: Former Smoker  °  Quit date: 01/06/1976  °  Years since quitting: 42.8  °• Smokeless tobacco: Never Used  °• Tobacco comment: smoked 35 years ago as of 2013   °Substance and Sexual Activity  °• Alcohol use: No  °• Drug use: No  °• Sexual activity: Yes  °  Birth control/protection: None  °Lifestyle  °• Physical activity  °  Days per week: Not on file  °  Minutes per session: Not on file  °• Stress: Not on file  °Relationships  °• Social connections  °  Talks on phone: Not on file  °  Gets together: Not on file  °  Attends religious service: Not on file  °  Active member of club or organization: Not on file  °  Attends meetings of clubs or organizations: Not on file  °  Relationship status: Not  on file  °•   Intimate partner violence    Fear of current or ex partner: Not on file    Emotionally abused: Not on file    Physically abused: Not on file    Forced sexual activity: Not on file  Other Topics Concern   Not on file  Social History Narrative   Not on file    Family History:    Family History  Problem Relation Age of Onset   Heart disease Father        pacer   Urolithiasis Father    Heart attack Father    Aneurysm Mother 55       CNS aneurysm   Sudden death Mother    Diabetes Sister    Cancer Maternal Uncle        ? primary   Cancer Paternal Uncle        ? primary   Kidney disease Brother    Heart disease Sister    Heart disease Sister    Hyperlipidemia Neg Hx    Hypertension Neg Hx    Colon cancer Neg Hx      ROS:  Please see the history of present illness.   All other ROS reviewed and negative.     Physical Exam/Data:   Vitals:   10/24/18 2335 10/25/18 0408 10/25/18 0732 10/25/18 0920  BP: (!) 171/86 128/81 126/81 129/85  Pulse: (!) 59 (!) 57 61 (!) 54  Resp:  _0 Temp: (!) 97.5 F (36.4 C) 98.3 F (36.8 C) 98.4 F (36.9 C) 97.9 F (36.6 C)  TempSrc: Oral Oral Oral Oral  SpO2: 100% 97% 98% 99%  Weight:  100.2 kg    Height:        Intake/Output Summary (Last 24 hours) at 10/25/2018 0946 Last data filed at 10/25/2018 0932 Gross per 24 hour  Intake 634.32 ml  Output 225 ml  Net 409.32 ml   Last 3 Weights 10/25/2018 10/24/2018 09/28/2018  Weight (lbs) 220 lb 12.8 oz 225 lb 220 lb  Weight (kg) 100.154 kg 102.059 kg 99.791 kg     Body mass index is 30.8 kg/m.  General:  Well nourished, well developed, in no acute distress HEENT: normal Neck: no JVD Endocrine:  No thryomegaly Vascular: No carotid bruits Cardiac:  normal S1, S2; RRR; no murmur  Lungs:  clear to auscultation bilaterally, no wheezing, rhonchi or rales  Abd: soft, nontender, no hepatomegaly  Ext: no edema Musculoskeletal:  No deformities, BUE and  BLE strength normal and equal Skin: warm and dry  Neuro:  CNs 2-12 intact, no focal abnormalities noted Psych:  Normal affect   EKG:  The EKG was personally reviewed and demonstrates:  SB with TWI in lead III  Relevant CV Studies:  TTE: 10/25/18  IMPRESSIONS    1. Left ventricular ejection fraction, by visual estimation, is 55 to 60%. The left ventricle has normal function. Normal left ventricular size. There is no left ventricular hypertrophy.  2. Global right ventricle has normal systolic function.The right ventricular size is mildly enlarged. No increase in right ventricular wall thickness.  3. Left atrial size was mildly dilated.  4. Right atrial size was normal.  5. Mild to moderate mitral annular calcification.  6. The mitral valve is normal in structure. Trace mitral valve regurgitation. No evidence of mitral stenosis.  7. The tricuspid valve is normal in structure. Tricuspid valve regurgitation is trivial.  8. The aortic valve is tricuspid Aortic valve regurgitation was not visualized by color flow Doppler.  Mild aortic valve sclerosis without stenosis.  9. The pulmonic valve was normal in structure. Pulmonic valve regurgitation is not visualized by color flow Doppler. 10. TR signal is inadequate for assessing pulmonary artery systolic pressure.  Laboratory Data:  High Sensitivity Troponin:   Recent Labs  Lab 10/24/18 1431 10/24/18 1624 10/24/18 2341  TROPONINIHS _0 Chemistry Recent Labs  Lab 10/24/18 1431 10/25/18 0320  NA 136 140  K 4.9 4.1  CL 103 105  CO2 25 25  GLUCOSE 97 98  BUN 29* 27*  CREATININE 1.77* 1.65*  CALCIUM 9.8 9.4  GFRNONAA 40* 44*  GFRAA 46* 50*  ANIONGAP 8 10    Recent Labs  Lab 10/24/18 1638  PROT 8.2*  ALBUMIN 4.4  AST 41  ALT 28  ALKPHOS 73  BILITOT 0.8   Hematology Recent Labs  Lab 10/24/18 1431 10/25/18 0320  WBC 4.2 4.9  RBC 4.44 4.24  HGB 12.3* 11.8*  HCT 37.8* 35.5*  MCV 85.1 83.7  MCH 27.7 27.8    MCHC 32.5 33.2  RDW 13.8 13.8  PLT 209 213   BNPNo results for input(s): BNP, PROBNP in the last 168 hours.  DDimer  Recent Labs  Lab 10/24/18 1638  DDIMER 0.72*     Radiology/Studies:  Dg Chest 2 View  Result Date: 10/24/2018 CLINICAL DATA:  Chest pain EXAM: CHEST - 2 VIEW COMPARISON:  04/14/2014 FINDINGS: The heart size and mediastinal contours are within normal limits. Both lungs are clear. Disc degenerative disease of the thoracic spine. IMPRESSION: No acute abnormality of the lungs. Electronically Signed   By: Eddie Candle M.D.   On: 10/24/2018 14:39   Ct Angio Chest Pe W And/or Wo Contrast  Result Date: 10/24/2018 CLINICAL DATA:  Chest pain. EXAM: CT ANGIOGRAPHY CHEST WITH CONTRAST TECHNIQUE: Multidetector CT imaging of the chest was performed using the standard protocol during bolus administration of intravenous contrast. Multiplanar CT image reconstructions and MIPs were obtained to evaluate the vascular anatomy. CONTRAST:  142m OMNIPAQUE IOHEXOL 350 MG/ML SOLN COMPARISON:  None. FINDINGS: Cardiovascular: Satisfactory opacification of the pulmonary arteries to the segmental level. No evidence of pulmonary embolism. Normal heart size. No pericardial effusion. Coronary artery calcifications are noted. Mediastinum/Nodes: No enlarged mediastinal, hilar, or axillary lymph nodes. Thyroid gland, trachea, and esophagus demonstrate no significant findings. Lungs/Pleura: Lungs are clear. No pleural effusion or pneumothorax. Upper Abdomen: No acute abnormality. Musculoskeletal: No chest wall abnormality. No acute or significant osseous findings. Review of the MIP images confirms the above findings. IMPRESSION: No definite evidence of pulmonary embolus. Coronary artery calcifications are noted suggesting coronary artery disease. No other abnormality seen in the chest. Electronically Signed   By: JMarijo ConceptionM.D.   On: 10/24/2018 20:35    Assessment and Plan:   [name] a 63y.o.  male with a hx of HTN, HL, DM, OSA, gout who is being seen today for the evaluation of chest pain at the request of Dr. JBroadus John  1. Chest Pain: Has had progressive symptoms for about a month. Mostly exertional related. Seems to have intensified in nature and now happening more regularly. Does have a strong family hx of CAD, with CRFs of HTN, HL and DM. HsT are negative x2. EKG showed SB with TWI in lead III. CT angio with coronary calcifications noted as well. No current chest pain. Would benefit from invasive work up with coronary angiography.  -- The patient understands that risks included but are  not limited to stroke (1 in 1000), death (1 in 54), kidney failure [usually temporary] (1 in 500), bleeding (1 in 200), allergic reaction [possibly serious] (1 in 200).  -- will given IVF 300cc bolus x1, and then 150hr for additional 2 hrs prior to cath.  2. HTN: some varied readings. On ACEi. Noted to be bradycardic, therefore no room for BB. May need further adjustment.   3. HL: LDL 90, currently on atorvastatin 33m daily. Will further increase to 878mdaily.   4. DM: states he was previously controlled with diet and exercise, but has not been able to go to the gym. Started on metformin by his PCP. Hgb A1c 6.6 (08/2018).  For questions or updates, please contact [email or web address] consult [email or web address] for contact info under   Signed, LiReino BellisNP  10/25/2018 9:46 AM

## 2018-10-25 NOTE — Progress Notes (Addendum)
PROGRESS NOTE    [name]  WUJ:[id number] DOB: 09-Aug-1955 DOA: 10/24/2018 PCP: Mellody Dance, DO  Brief Narrative: This is a 63 year old male with history of type 2 diabetes mellitus hypertension, dyslipidemia, chronic back pain, chronic kidney disease stage III, sleep apnea presented to the emergency room with ongoing chest pain for 1 month, this is exertional, burning discomfort which is substernal. -  Assessment & Plan:   Substernal chest pain concerning for angina -EKG without acute ST-T wave changes, high-sensitivity troponin negative -However exertional symptoms are concerning for angina, he has a considerable risk factor profile as well with type 2 diabetes, dyslipidemia, strong family history of premature CAD -Will request cardiology consultation to consider ischemia evaluation -CT angiogram was negative for PE but did note coronary calcifications  Hypertension -hold benazepril  CKD 3 -Creatinine stable  Type 2 diabetes mellitus -Metformin on hold, sliding scale insulin, recent A1c was 6.6 in August  Chronic back pain -Continue gabapentin and as needed tramadol    DVT prophylaxis: Heparin subcutaneous Code Status: Full code Family Communication: No family at bedside Disposition Plan: Home pending above work-up  Consultants:   Cardiology   Procedures:   Antimicrobials:    Subjective: -Okay this morning, reports intermittent exertional chest pain for 1 month, associated with dyspnea, nausea and diaphoresis  Objective: Vitals:   10/25/18 0408 10/25/18 0732 10/25/18 0920 10/25/18 1131  BP: 128/81 126/81 129/85 125/84  Pulse: (!) 57 61 (!) 54 (!) 58  Resp: 17 16 18 18   Temp: 98.3 F (36.8 C) 98.4 F (36.9 C) 97.9 F (36.6 C) 98.1 F (36.7 C)  TempSrc: Oral Oral Oral Oral  SpO2: 97% 98% 99% 100%  Weight: 100.2 kg     Height:        Intake/Output Summary (Last 24 hours) at 10/25/2018 1348 Last data filed at 10/25/2018 1235 Gross per 24 hour   Intake 634.32 ml  Output 525 ml  Net 109.32 ml   Filed Weights   10/24/18 1426 10/25/18 0408  Weight: 102.1 kg 100.2 kg    Examination:  General exam: Appears calm and comfortable no distress, AAO x3 Respiratory system: Clear to auscultation. Respiratory effort normal. Cardiovascular system: S1 & S2 heard, RRR.  Gastrointestinal system: Abdomen is nondistended, soft and nontender.Normal bowel sounds heard. Central nervous system: Alert and oriented. No focal neurological deficits. Extremities: No edema. Skin: No rashes, lesions or ulcers Psychiatry: Judgement and insight appear normal. Mood & affect appropriate.     Data Reviewed:   CBC: Recent Labs  Lab 10/24/18 1431 10/25/18 0320  WBC 4.2 4.9  HGB 12.3* 11.8*  HCT 37.8* 35.5*  MCV 85.1 83.7  PLT 209 956   Basic Metabolic Panel: Recent Labs  Lab 10/24/18 1431 10/25/18 0320  NA 136 140  K 4.9 4.1  CL 103 105  CO2 25 25  GLUCOSE 97 98  BUN 29* 27*  CREATININE 1.77* 1.65*  CALCIUM 9.8 9.4   GFR: Estimated Creatinine Clearance: 55.3 mL/min (A) (by C-G formula based on SCr of 1.65 mg/dL (H)). Liver Function Tests: Recent Labs  Lab 10/24/18 1638  AST 41  ALT 28  ALKPHOS 73  BILITOT 0.8  PROT 8.2*  ALBUMIN 4.4   Recent Labs  Lab 10/24/18 1638  LIPASE 37   No results for input(s): AMMONIA in the last 168 hours. Coagulation Profile: No results for input(s): INR, PROTIME in the last 168 hours. Cardiac Enzymes: No results for input(s): CKTOTAL, CKMB, CKMBINDEX, TROPONINI in the last 168 hours.  BNP (last 3 results) No results for input(s): PROBNP in the last 8760 hours. HbA1C: No results for input(s): HGBA1C in the last 72 hours. CBG: Recent Labs  Lab 10/25/18 1133  GLUCAP 122*   Lipid Profile: Recent Labs    10/25/18 0320  CHOL 158  HDL 47  LDLCALC 90  TRIG 104  CHOLHDL 3.4   Thyroid Function Tests: No results for input(s): TSH, T4TOTAL, FREET4, T3FREE, THYROIDAB in the last 72  hours. Anemia Panel: No results for input(s): VITAMINB12, FOLATE, FERRITIN, TIBC, IRON, RETICCTPCT in the last 72 hours. Urine analysis:    Component Value Date/Time   LABSPEC 1.015 11/27/2011 1911   PHURINE 6.0 11/27/2011 1911   GLUCOSEU NEGATIVE 11/27/2011 1911   HGBUR NEGATIVE 11/27/2011 1911   BILIRUBINUR Neg 04/20/2014 1518   KETONESUR NEGATIVE 11/27/2011 1911   PROTEINUR 15 04/20/2014 1518   PROTEINUR NEGATIVE 11/27/2011 1911   UROBILINOGEN 0.2 04/20/2014 1518   UROBILINOGEN 0.2 11/27/2011 1911   NITRITE Neg 04/20/2014 1518   NITRITE NEGATIVE 11/27/2011 1911   LEUKOCYTESUR Trace 04/20/2014 1518   Sepsis Labs: @LABRCNTIP (procalcitonin:4,lacticidven:4)  ) Recent Results (from the past 240 hour(s))  SARS CORONAVIRUS 2 (TAT 6-24 HRS) Nasopharyngeal Nasopharyngeal Swab     Status: None   Collection Time: 10/24/18 10:00 PM   Specimen: Nasopharyngeal Swab  Result Value Ref Range Status   SARS Coronavirus 2 NEGATIVE NEGATIVE Final    Comment: (NOTE) SARS-CoV-2 target nucleic acids are NOT DETECTED. The SARS-CoV-2 RNA is generally detectable in upper and lower respiratory specimens during the acute phase of infection. Negative results do not preclude SARS-CoV-2 infection, do not rule out co-infections with other pathogens, and should not be used as the sole basis for treatment or other patient management decisions. Negative results must be combined with clinical observations, patient history, and epidemiological information. The expected result is Negative. Fact Sheet for Patients: SugarRoll.be Fact Sheet for Healthcare Providers: https://www.woods-mathews.com/ This test is not yet approved or cleared by the [place] FDA and  has been authorized for detection and/or diagnosis of SARS-CoV-2 by FDA under an Emergency Use Authorization (EUA). This EUA will remain  in effect (meaning this test can be used) for the duration of the  COVID-19 declaration under Section 56 4(b)(1) of the Act, 21 U.S.C. section 360bbb-3(b)(1), unless the authorization is terminated or revoked sooner. Performed at [facility], [place] [address] [place],  [address]          Radiology Studies: Dg Chest 2 View  Result Date: 10/24/2018 CLINICAL DATA:  Chest pain EXAM: CHEST - 2 VIEW COMPARISON:  04/14/2014 FINDINGS: The heart size and mediastinal contours are within normal limits. Both lungs are clear. Disc degenerative disease of the thoracic spine. IMPRESSION: No acute abnormality of the lungs. Electronically Signed   By: Eddie Candle M.D.   On: 10/24/2018 14:39   Ct Angio Chest Pe W And/or Wo Contrast  Result Date: 10/24/2018 CLINICAL DATA:  Chest pain. EXAM: CT ANGIOGRAPHY CHEST WITH CONTRAST TECHNIQUE: Multidetector CT imaging of the chest was performed using the standard protocol during bolus administration of intravenous contrast. Multiplanar CT image reconstructions and MIPs were obtained to evaluate the vascular anatomy. CONTRAST:  129mL OMNIPAQUE IOHEXOL 350 MG/ML SOLN COMPARISON:  None. FINDINGS: Cardiovascular: Satisfactory opacification of the pulmonary arteries to the segmental level. No evidence of pulmonary embolism. Normal heart size. No pericardial effusion. Coronary artery calcifications are noted. Mediastinum/Nodes: No enlarged mediastinal, hilar, or axillary lymph nodes. Thyroid gland, trachea, and esophagus  demonstrate no significant findings. Lungs/Pleura: Lungs are clear. No pleural effusion or pneumothorax. Upper Abdomen: No acute abnormality. Musculoskeletal: No chest wall abnormality. No acute or significant osseous findings. Review of the MIP images confirms the above findings. IMPRESSION: No definite evidence of pulmonary embolus. Coronary artery calcifications are noted suggesting coronary artery disease. No other abnormality seen in the chest. Electronically Signed   By: Marijo Conception M.D.   On:  10/24/2018 20:35        Scheduled Meds: . aspirin EC  81 mg Oral Daily  . atorvastatin  80 mg Oral QHS  . FLUoxetine  40 mg Oral Daily  . gabapentin  800 mg Oral TID  . heparin  5,000 Units Subcutaneous Q8H  . pantoprazole  20 mg Oral Daily  . sodium chloride flush  3 mL Intravenous Q12H   Continuous Infusions: . sodium chloride    . sodium chloride 150 mL/hr (10/25/18 1244)     LOS: 0 days    Time spent: 86min  Domenic Polite, MD [facility]   10/25/2018, 1:48 PM

## 2018-10-25 NOTE — Progress Notes (Signed)
ANTICOAGULATION CONSULT NOTE - Initial Consult  Pharmacy Consult for heparin Indication: chest pain/ACS  Allergies  Allergen Reactions  . Prednisone Other (See Comments)    Caused blood sugar to increase, pt will not take    Patient Measurements: Height: 5\' 11"  (180.3 cm) Weight: 220 lb 12.8 oz (100.2 kg) IBW/kg (Calculated) : 75.3 Heparin Dosing Weight: 96.5 kg   Vital Signs: Temp: 97.9 F (36.6 C) (10/20 1525) Temp Source: Oral (10/20 1525) BP: 133/84 (10/20 1528) Pulse Rate: 61 (10/20 1528)  Labs: Recent Labs    10/24/18 1431 10/24/18 1624 10/24/18 2341 10/25/18 0320  HGB 12.3*  --   --  11.8*  HCT 37.8*  --   --  35.5*  PLT 209  --   --  213  CREATININE 1.77*  --   --  1.65*  TROPONINIHS 4 5 7   --     Estimated Creatinine Clearance: 55.3 mL/min (A) (by C-G formula based on SCr of 1.65 mg/dL (H)).   Medical History: Past Medical History:  Diagnosis Date  . Carpal tunnel syndrome, bilateral   . Carpal tunnel syndrome, right    nerve impingement right arm/wears brace  . Diabetes mellitus    no meds  . Dyspnea   . GERD (gastroesophageal reflux disease)   . Gout   . Hepatic steatosis   . Hyperlipidemia   . Hypertension   . Obstructive sleep apnea     Medications:  Scheduled:  . aspirin EC  81 mg Oral Daily  . atorvastatin  80 mg Oral QHS  . FLUoxetine  40 mg Oral Daily  . gabapentin  800 mg Oral TID  . pantoprazole  20 mg Oral Daily  . sodium chloride flush  3 mL Intravenous Q12H    Assessment: 56 yom presenting with chest pain. Underwent cath finding severe L main dx with proximal LAD stenosis - now planning urgent cardiac surgical consult. No AC PTA.   Hgb 11.8, plt 213. No s/sx of bleeding. Sheath was removed on 1503- plan for heparin start 2 hours after.   Goal of Therapy:  Heparin level 0.3-0.7 units/ml Monitor platelets by anticoagulation protocol: Yes   Plan:  Start heparin infusion at 1150 units/hr on 10/20@1700  Check anti-Xa level  in 6 hours and daily while on heparin Continue to monitor H&H and platelets  Antonietta Jewel, PharmD, BCCCP Clinical Pharmacist  Phone: [phone]  Please check AMION for all [place] phone numbers After 10:00 PM, call [place] [phone] 10/25/2018,3:58 PM

## 2018-10-25 NOTE — Plan of Care (Signed)
  Problem: Clinical Measurements: Goal: Will remain free from infection Outcome: Progressing Goal: Respiratory complications will improve Outcome: Progressing   Problem: Safety: Goal: Ability to remain free from injury will improve Outcome: Progressing   

## 2018-10-25 NOTE — Anesthesia Preprocedure Evaluation (Addendum)
Anesthesia Evaluation  Patient identified by MRN, date of birth, ID band Patient awake    Reviewed: Allergy & Precautions, NPO status , Patient's Chart, lab work & pertinent test results  Airway Mallampati: II  TM Distance: >3 FB Neck ROM: Full    Dental  (+) Partial Upper, Dental Advisory Given   Pulmonary shortness of breath, sleep apnea , former smoker,    Pulmonary exam normal breath sounds clear to auscultation       Cardiovascular hypertension, + angina + CAD   Rhythm:Regular Rate:Normal     Neuro/Psych PSYCHIATRIC DISORDERS  Neuromuscular disease    GI/Hepatic GERD  ,  Endo/Other  diabetes  Renal/GU Renal disease     Musculoskeletal  (+) Arthritis ,   Abdominal   Peds  Hematology  (+) anemia ,   Anesthesia Other Findings   Reproductive/Obstetrics                           Anesthesia Physical Anesthesia Plan  ASA: IV  Anesthesia Plan: General   Post-op Pain Management:    Induction: Intravenous  PONV Risk Score and Plan: 3 and Midazolam and Treatment may vary due to age or medical condition  Airway Management Planned: Oral ETT  Additional Equipment: Arterial line, CVP, PA Cath, TEE and Ultrasound Guidance Line Placement  Intra-op Plan: Utilization Of Total Body Hypothermia per surgeon request  Post-operative Plan: Post-operative intubation/ventilation  Informed Consent: I have reviewed the patients History and Physical, chart, labs and discussed the procedure including the risks, benefits and alternatives for the proposed anesthesia with the patient or authorized representative who has indicated his/her understanding and acceptance.     Dental advisory given  Plan Discussed with: CRNA  Anesthesia Plan Comments:         Anesthesia Quick Evaluation

## 2018-10-25 NOTE — H&P (View-Only) (Signed)
The patient has been seen in conjunction with Reino Bellis, NP. All aspects of care have been considered and discussed. The patient has been personally interviewed, examined, and all clinical data has been reviewed.   Acute coronary syndrome, Crescendo Angina --> needs cath. No LVGram needed but LVEDP will be useful.  CKD stage 3 (eGFR 50). Unfortunately ACE given this AM but will place on hold now. Should get 3cc/kg over 1 hour then 150 cc/hr thereafter.  + Risk Factors: Fam hx. CAD -> father and 2 sisters, DM II, Htn., Hyperlipidemia, and OSA.  The patient was counseled to undergo left heart catheterization, coronary angiography, and possible percutaneous coronary intervention with stent implantation. The procedural risks and benefits were discussed in detail. The risks discussed included death, stroke, myocardial infarction, life-threatening bleeding, limb ischemia, kidney injury, allergy, and possible emergency cardiac surgery. The risk of these significant complications were estimated to occur less than 1% of the time. After discussion, the patient has agreed to proceed.     Cardiology Consultation:   Patient ID: [name] MRN: [id number]; DOB: 26-Sep-1955  Admit date: 10/24/2018 Date of Consult: 10/25/2018  Primary Care Provider: Mellody Dance, DO Primary Cardiologist: No primary care provider on file.  Primary Electrophysiologist:  None    Patient Profile:   [name] is a 63 y.o. male with a hx of HTN, HL, DM, OSA, gout who is being seen today for the evaluation of chest pain at the request of Dr. Broadus John.  History of Present Illness:   [name] is a 63 yo male with PMH noted above. He was seen by Dr. Johnsie Cancel in 2016 for evaluation of chest pain with strong family hx of CAD. Reports sister with CABG in her 57s, mother with MI at 67, and father with CABG in his 69s. He was sent for a exercise stress test showing no evidence of scarring, EF noted at 46%.  Recommended follow up echo which showed 60-65% with no WMA.   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  States prior to Covid him and his wife are going to the gym on a regular basis and would often walk a mile even before starting exercise.  Did not experience any exertional symptoms with this activity.  Doing simple activities around his home he does not have exertional symptoms.  Has noticed over the past month with work he started to experience symptoms.  Most recently when he pulls his trash cans out to the road, about 150 feet walk he experiences centralized chest tightness and shortness of breath.  States he has been working to remodel the rooms in his home and has noticed with lifting his equipment he gets exertional chest tightness.  On Saturday states he had worked in the last room in his house he has been remodeling and did not experience any symptoms, but packed all of his tools and went to lift them when he experienced significant chest tightness.  Stated this was the most severe episode he had had up to this point. Rated at 10/10. Also felt short of breath with it.  He used inhaler without much relief.  Symptoms did eventually resolve with rest. Did not seek care at that time.   His primary care office yesterday with symptoms but was directed to the ER for further evaluation. He presented to the [facility] urgent care 10/19 with the symptoms evaluated and sent to the ED for further evaluation.  In  the ED his labs showed stable electrolytes, Cr 1.77, Hgb 12.3, Ddimer 0.72, HsT 4>>5. CXR negative. CT angio negative for PE, but noted coronary calcifications. EKG showed SB with isolated TWI in lead III. Echo today with normal EF and no rWMA noted.   Heart Pathway Score:     Past Medical History:  Diagnosis Date   Carpal tunnel syndrome, bilateral    Carpal tunnel syndrome, right    nerve impingement right arm/wears brace   Diabetes mellitus    no meds    Dyspnea    GERD (gastroesophageal reflux disease)    Gout    Hepatic steatosis    Hyperlipidemia    Hypertension    Obstructive sleep apnea     Past Surgical History:  Procedure Laterality Date   CARPAL TUNNEL RELEASE  09/10/2015   RIGHT WRIST / WITH NERVE IMPINGEMENT SURGERY   CERVICAL FUSION     Dr Sherwood Gambler   COLONOSCOPY  2007   negative; [facility]   ESOPHAGEAL DILATION  2007   FINGER AMPUTATION  2003   Left Index finger amputation & reattachment   LUMBAR FUSION      Dr Sherwood Gambler     Home Medications:  Prior to Admission medications   Medication Sig Start Date End Date Taking? Authorizing Provider  acetaminophen (TYLENOL) 500 MG tablet Take 1,000 mg by mouth daily.   Yes [provider]  allopurinol (ZYLOPRIM) 300 MG tablet TAKE 1/2 TABLET BY MOUTH DAILY Patient taking differently: Take 150 mg by mouth daily.  10/17/18  Yes Opalski, Neoma Laming, DO  amphetamine-dextroamphetamine (ADDERALL) 10 MG tablet Take 1 tablet (10 mg total) by mouth daily with breakfast. 10/09/18 11/08/18 Yes Opalski, Neoma Laming, DO  Ascorbic Acid (VITAMIN C) 1000 MG tablet Take 1,000 mg by mouth daily.   Yes [provider]  aspirin 81 MG tablet Take 81 mg by mouth daily.     Yes [provider]  atorvastatin (LIPITOR) 40 MG tablet TAKE 1 TABLET BY MOUTH EACH NIGHT AT BEDTIME Patient taking differently: Take 40 mg by mouth at bedtime.  10/07/18  Yes Opalski, Deborah, DO  benazepril (LOTENSIN) 20 MG tablet TAKE 1 TABLET BY MOUTH DAILY Patient taking differently: Take 20 mg by mouth daily.  07/05/18  Yes Opalski, Deborah, DO  blood glucose meter kit and supplies Dispense based on patient and insurance preference. Use to check fasting blood glucose in the morning and to check glucose 2 hours after largest meal of the day. (FOR ICD-10 E10.9, E11.9). 01/13/17  Yes Opalski, Deborah, DO  FLUoxetine (PROZAC) 40 MG capsule TAKE 1 CAPSULE BY MOUTH DAILY Patient taking differently: Take  40 mg by mouth daily.  06/28/18  Yes Opalski, Neoma Laming, DO  gabapentin (NEURONTIN) 800 MG tablet Take 1 tablet (800 mg total) by mouth 3 (three) times daily. 05/06/18  Yes Opalski, Deborah, DO  glucose blood test strip Use as instructed 01/13/17  Yes Opalski, Deborah, DO  ketoconazole (NIZORAL) 2 % cream Apply 1 application topically 2 (two) times daily. To affected areas. 08/29/18  Yes Opalski, Neoma Laming, DO  LANCETS ULTRA FINE MISC Use to check fasting blood glucose in the morning and to check glucose 2 hours after largest meal of the day 01/13/17  Yes Opalski, Neoma Laming, DO  metFORMIN (GLUCOPHAGE-XR) 500 MG 24 hr tablet TAKE 1 TABLET BY MOUTH DAILY WITH BREAKFAST Patient taking differently: Take 500 mg by mouth daily with breakfast.  08/15/18  Yes Opalski, Deborah, DO  Multiple Vitamins-Iron (MULTIVITAMINS WITH IRON) TABS Take  1 tablet by mouth daily. 11/27/11  Yes Chatten, Carmen L, NP  MYRBETRIQ 25 MG TB24 tablet Take 25 mg by mouth daily.  05/15/16  Yes [provider]  pantoprazole (PROTONIX) 40 MG tablet TAKE 1 TABLET BY MOUTH DAILY Patient taking differently: Take 40 mg by mouth daily.  05/25/18  Yes Opalski, Deborah, DO  polyethylene glycol powder (GLYCOLAX/MIRALAX) powder USE AS DIRECTED Patient taking differently: Take 1 Container by mouth as directed.  07/17/14  Yes Midge Minium, MD  pyridoxine (B-6) 100 MG tablet Take 100 mg by mouth daily.   Yes [provider]  traMADol (ULTRAM) 50 MG tablet Take 50 mg by mouth as needed for pain. 10/17/18  Yes [provider]  traZODone (DESYREL) 50 MG tablet TAKE 1/2 TO 1 TABLET BY MOUTH AT [place] Patient taking differently: Take 25-50 mg by mouth at bedtime as needed for sleep.  10/07/18  Yes Opalski, Deborah, DO  Vitamin D, Ergocalciferol, (DRISDOL) 1.25 MG (50000 UT) CAPS capsule TAKE 1 CAPSULE BY MOUTH EVERY 7 DAYS Patient taking differently: Take 50,000 Units by mouth every 7 (seven) days. Saturdays 03/01/18   Yes Opalski, Neoma Laming, DO  amphetamine-dextroamphetamine (ADDERALL) 10 MG tablet Take 1 tablet (10 mg total) by mouth daily with breakfast. Patient not taking: Reported on 10/24/2018 08/09/18 09/08/18  Mellody Dance, DO    Inpatient Medications: Scheduled Meds:  aspirin EC  81 mg Oral Daily   atorvastatin  40 mg Oral QHS   benazepril  20 mg Oral Daily   FLUoxetine  40 mg Oral Daily   gabapentin  800 mg Oral TID   heparin  5,000 Units Subcutaneous Q8H   pantoprazole  20 mg Oral Daily   Continuous Infusions:  PRN Meds: acetaminophen, ondansetron (ZOFRAN) IV, traMADol  Allergies:    Allergies  Allergen Reactions   Prednisone Other (See Comments)    Caused blood sugar to increase, pt will not take    Social History:   Social History   Socioeconomic History   Marital status: Married    Spouse name: Not on file   Number of children: Not on file   Years of education: Not on file   Highest education level: Not on file  Occupational History   Not on file  Social Needs   Financial resource strain: Not on file   Food insecurity    Worry: Not on file    Inability: Not on file   Transportation needs    Medical: Not on file    Non-medical: Not on file  Tobacco Use   Smoking status: Former Smoker    Quit date: 01/06/1976    Years since quitting: 42.8   Smokeless tobacco: Never Used   Tobacco comment: smoked 35 years ago as of 2013   Substance and Sexual Activity   Alcohol use: No   Drug use: No   Sexual activity: Yes    Birth control/protection: None  Lifestyle   Physical activity    Days per week: Not on file    Minutes per session: Not on file   Stress: Not on file  Relationships   Social connections    Talks on phone: Not on file    Gets together: Not on file    Attends religious service: Not on file    Active member of club or organization: Not on file    Attends meetings of clubs or organizations: Not on file    Relationship status: Not  on file  Intimate partner violence    Fear of current or ex partner: Not on file    Emotionally abused: Not on file    Physically abused: Not on file    Forced sexual activity: Not on file  Other Topics Concern   Not on file  Social History Narrative   Not on file    Family History:    Family History  Problem Relation Age of Onset   Heart disease Father        pacer   Urolithiasis Father    Heart attack Father    Aneurysm Mother 73       CNS aneurysm   Sudden death Mother    Diabetes Sister    Cancer Maternal Uncle        ? primary   Cancer Paternal Uncle        ? primary   Kidney disease Brother    Heart disease Sister    Heart disease Sister    Hyperlipidemia Neg Hx    Hypertension Neg Hx    Colon cancer Neg Hx      ROS:  Please see the history of present illness.   All other ROS reviewed and negative.     Physical Exam/Data:   Vitals:   10/24/18 2335 10/25/18 0408 10/25/18 0732 10/25/18 0920  BP: (!) 171/86 128/81 126/81 129/85  Pulse: (!) 59 (!) 57 61 (!) 54  Resp:  _0 Temp: (!) 97.5 F (36.4 C) 98.3 F (36.8 C) 98.4 F (36.9 C) 97.9 F (36.6 C)  TempSrc: Oral Oral Oral Oral  SpO2: 100% 97% 98% 99%  Weight:  100.2 kg    Height:        Intake/Output Summary (Last 24 hours) at 10/25/2018 0946 Last data filed at 10/25/2018 0932 Gross per 24 hour  Intake 634.32 ml  Output 225 ml  Net 409.32 ml   Last 3 Weights 10/25/2018 10/24/2018 09/28/2018  Weight (lbs) 220 lb 12.8 oz 225 lb 220 lb  Weight (kg) 100.154 kg 102.059 kg 99.791 kg     Body mass index is 30.8 kg/m.  General:  Well nourished, well developed, in no acute distress HEENT: normal Neck: no JVD Endocrine:  No thryomegaly Vascular: No carotid bruits Cardiac:  normal S1, S2; RRR; no murmur  Lungs:  clear to auscultation bilaterally, no wheezing, rhonchi or rales  Abd: soft, nontender, no hepatomegaly  Ext: no edema Musculoskeletal:  No deformities, BUE and  BLE strength normal and equal Skin: warm and dry  Neuro:  CNs 2-12 intact, no focal abnormalities noted Psych:  Normal affect   EKG:  The EKG was personally reviewed and demonstrates:  SB with TWI in lead III  Relevant CV Studies:  TTE: 10/25/18  IMPRESSIONS    1. Left ventricular ejection fraction, by visual estimation, is 55 to 60%. The left ventricle has normal function. Normal left ventricular size. There is no left ventricular hypertrophy.  2. Global right ventricle has normal systolic function.The right ventricular size is mildly enlarged. No increase in right ventricular wall thickness.  3. Left atrial size was mildly dilated.  4. Right atrial size was normal.  5. Mild to moderate mitral annular calcification.  6. The mitral valve is normal in structure. Trace mitral valve regurgitation. No evidence of mitral stenosis.  7. The tricuspid valve is normal in structure. Tricuspid valve regurgitation is trivial.  8. The aortic valve is tricuspid Aortic valve regurgitation was not visualized by color flow Doppler.  Mild aortic valve sclerosis without stenosis.  9. The pulmonic valve was normal in structure. Pulmonic valve regurgitation is not visualized by color flow Doppler. 10. TR signal is inadequate for assessing pulmonary artery systolic pressure.  Laboratory Data:  High Sensitivity Troponin:   Recent Labs  Lab 10/24/18 1431 10/24/18 1624 10/24/18 2341  TROPONINIHS _0 Chemistry Recent Labs  Lab 10/24/18 1431 10/25/18 0320  NA 136 140  K 4.9 4.1  CL 103 105  CO2 25 25  GLUCOSE 97 98  BUN 29* 27*  CREATININE 1.77* 1.65*  CALCIUM 9.8 9.4  GFRNONAA 40* 44*  GFRAA 46* 50*  ANIONGAP 8 10    Recent Labs  Lab 10/24/18 1638  PROT 8.2*  ALBUMIN 4.4  AST 41  ALT 28  ALKPHOS 73  BILITOT 0.8   Hematology Recent Labs  Lab 10/24/18 1431 10/25/18 0320  WBC 4.2 4.9  RBC 4.44 4.24  HGB 12.3* 11.8*  HCT 37.8* 35.5*  MCV 85.1 83.7  MCH 27.7 27.8    MCHC 32.5 33.2  RDW 13.8 13.8  PLT 209 213   BNPNo results for input(s): BNP, PROBNP in the last 168 hours.  DDimer  Recent Labs  Lab 10/24/18 1638  DDIMER 0.72*     Radiology/Studies:  Dg Chest 2 View  Result Date: 10/24/2018 CLINICAL DATA:  Chest pain EXAM: CHEST - 2 VIEW COMPARISON:  04/14/2014 FINDINGS: The heart size and mediastinal contours are within normal limits. Both lungs are clear. Disc degenerative disease of the thoracic spine. IMPRESSION: No acute abnormality of the lungs. Electronically Signed   By: Eddie Candle M.D.   On: 10/24/2018 14:39   Ct Angio Chest Pe W And/or Wo Contrast  Result Date: 10/24/2018 CLINICAL DATA:  Chest pain. EXAM: CT ANGIOGRAPHY CHEST WITH CONTRAST TECHNIQUE: Multidetector CT imaging of the chest was performed using the standard protocol during bolus administration of intravenous contrast. Multiplanar CT image reconstructions and MIPs were obtained to evaluate the vascular anatomy. CONTRAST:  142m OMNIPAQUE IOHEXOL 350 MG/ML SOLN COMPARISON:  None. FINDINGS: Cardiovascular: Satisfactory opacification of the pulmonary arteries to the segmental level. No evidence of pulmonary embolism. Normal heart size. No pericardial effusion. Coronary artery calcifications are noted. Mediastinum/Nodes: No enlarged mediastinal, hilar, or axillary lymph nodes. Thyroid gland, trachea, and esophagus demonstrate no significant findings. Lungs/Pleura: Lungs are clear. No pleural effusion or pneumothorax. Upper Abdomen: No acute abnormality. Musculoskeletal: No chest wall abnormality. No acute or significant osseous findings. Review of the MIP images confirms the above findings. IMPRESSION: No definite evidence of pulmonary embolus. Coronary artery calcifications are noted suggesting coronary artery disease. No other abnormality seen in the chest. Electronically Signed   By: JMarijo ConceptionM.D.   On: 10/24/2018 20:35    Assessment and Plan:   [name] a 63y.o.  male with a hx of HTN, HL, DM, OSA, gout who is being seen today for the evaluation of chest pain at the request of Dr. JBroadus John  1. Chest Pain: Has had progressive symptoms for about a month. Mostly exertional related. Seems to have intensified in nature and now happening more regularly. Does have a strong family hx of CAD, with CRFs of HTN, HL and DM. HsT are negative x2. EKG showed SB with TWI in lead III. CT angio with coronary calcifications noted as well. No current chest pain. Would benefit from invasive work up with coronary angiography.  -- The patient understands that risks included but are  not limited to stroke (1 in 1000), death (1 in 54), kidney failure [usually temporary] (1 in 500), bleeding (1 in 200), allergic reaction [possibly serious] (1 in 200).  -- will given IVF 300cc bolus x1, and then 150hr for additional 2 hrs prior to cath.  2. HTN: some varied readings. On ACEi. Noted to be bradycardic, therefore no room for BB. May need further adjustment.   3. HL: LDL 90, currently on atorvastatin 33m daily. Will further increase to 878mdaily.   4. DM: states he was previously controlled with diet and exercise, but has not been able to go to the gym. Started on metformin by his PCP. Hgb A1c 6.6 (08/2018).  For questions or updates, please contact [email or web address] consult [email or web address] for contact info under   Signed, LiReino BellisNP  10/25/2018 9:46 AM

## 2018-10-26 ENCOUNTER — Inpatient Hospital Stay (HOSPITAL_COMMUNITY): Payer: PPO

## 2018-10-26 ENCOUNTER — Observation Stay (HOSPITAL_COMMUNITY): Payer: PPO | Admitting: Certified Registered Nurse Anesthetist

## 2018-10-26 ENCOUNTER — Encounter (HOSPITAL_COMMUNITY): Payer: Self-pay | Admitting: Cardiovascular Disease

## 2018-10-26 ENCOUNTER — Inpatient Hospital Stay (HOSPITAL_COMMUNITY)
Admission: EM | Disposition: A | Discharge status: Home / Self Care | Payer: Self-pay | Source: Home / Self Care | Attending: Thoracic Surgery (Cardiothoracic Vascular Surgery)

## 2018-10-26 DIAGNOSIS — G4733 Obstructive sleep apnea (adult) (pediatric): Secondary | ICD-10-CM | POA: Diagnosis present

## 2018-10-26 DIAGNOSIS — Z20828 Contact with and (suspected) exposure to other viral communicable diseases: Secondary | ICD-10-CM | POA: Diagnosis present

## 2018-10-26 DIAGNOSIS — Z7982 Long term (current) use of aspirin: Secondary | ICD-10-CM | POA: Diagnosis not present

## 2018-10-26 DIAGNOSIS — E1159 Type 2 diabetes mellitus with other circulatory complications: Secondary | ICD-10-CM | POA: Diagnosis not present

## 2018-10-26 DIAGNOSIS — N184 Chronic kidney disease, stage 4 (severe): Secondary | ICD-10-CM | POA: Diagnosis not present

## 2018-10-26 DIAGNOSIS — J9811 Atelectasis: Secondary | ICD-10-CM | POA: Diagnosis not present

## 2018-10-26 DIAGNOSIS — Z981 Arthrodesis status: Secondary | ICD-10-CM | POA: Diagnosis not present

## 2018-10-26 DIAGNOSIS — K219 Gastro-esophageal reflux disease without esophagitis: Secondary | ICD-10-CM | POA: Diagnosis present

## 2018-10-26 DIAGNOSIS — I2510 Atherosclerotic heart disease of native coronary artery without angina pectoris: Secondary | ICD-10-CM | POA: Diagnosis present

## 2018-10-26 DIAGNOSIS — I152 Hypertension secondary to endocrine disorders: Secondary | ICD-10-CM | POA: Diagnosis present

## 2018-10-26 DIAGNOSIS — F329 Major depressive disorder, single episode, unspecified: Secondary | ICD-10-CM | POA: Diagnosis present

## 2018-10-26 DIAGNOSIS — I129 Hypertensive chronic kidney disease with stage 1 through stage 4 chronic kidney disease, or unspecified chronic kidney disease: Secondary | ICD-10-CM | POA: Diagnosis not present

## 2018-10-26 DIAGNOSIS — Z888 Allergy status to other drugs, medicaments and biological substances status: Secondary | ICD-10-CM | POA: Diagnosis not present

## 2018-10-26 DIAGNOSIS — D62 Acute posthemorrhagic anemia: Secondary | ICD-10-CM | POA: Diagnosis not present

## 2018-10-26 DIAGNOSIS — I25810 Atherosclerosis of coronary artery bypass graft(s) without angina pectoris: Secondary | ICD-10-CM | POA: Diagnosis not present

## 2018-10-26 DIAGNOSIS — R079 Chest pain, unspecified: Secondary | ICD-10-CM | POA: Diagnosis not present

## 2018-10-26 DIAGNOSIS — K760 Fatty (change of) liver, not elsewhere classified: Secondary | ICD-10-CM | POA: Diagnosis present

## 2018-10-26 DIAGNOSIS — I25110 Atherosclerotic heart disease of native coronary artery with unstable angina pectoris: Secondary | ICD-10-CM | POA: Diagnosis present

## 2018-10-26 DIAGNOSIS — Z7984 Long term (current) use of oral hypoglycemic drugs: Secondary | ICD-10-CM | POA: Diagnosis not present

## 2018-10-26 DIAGNOSIS — E785 Hyperlipidemia, unspecified: Secondary | ICD-10-CM | POA: Diagnosis not present

## 2018-10-26 DIAGNOSIS — Z951 Presence of aortocoronary bypass graft: Secondary | ICD-10-CM

## 2018-10-26 DIAGNOSIS — M549 Dorsalgia, unspecified: Secondary | ICD-10-CM | POA: Diagnosis present

## 2018-10-26 DIAGNOSIS — E1169 Type 2 diabetes mellitus with other specified complication: Secondary | ICD-10-CM | POA: Diagnosis present

## 2018-10-26 DIAGNOSIS — R0602 Shortness of breath: Secondary | ICD-10-CM | POA: Diagnosis not present

## 2018-10-26 DIAGNOSIS — G8929 Other chronic pain: Secondary | ICD-10-CM | POA: Diagnosis present

## 2018-10-26 DIAGNOSIS — M109 Gout, unspecified: Secondary | ICD-10-CM | POA: Diagnosis present

## 2018-10-26 DIAGNOSIS — E1122 Type 2 diabetes mellitus with diabetic chronic kidney disease: Secondary | ICD-10-CM | POA: Diagnosis present

## 2018-10-26 DIAGNOSIS — Z8249 Family history of ischemic heart disease and other diseases of the circulatory system: Secondary | ICD-10-CM | POA: Diagnosis not present

## 2018-10-26 DIAGNOSIS — N183 Chronic kidney disease, stage 3 unspecified: Secondary | ICD-10-CM | POA: Diagnosis present

## 2018-10-26 DIAGNOSIS — Z87440 Personal history of urinary (tract) infections: Secondary | ICD-10-CM | POA: Diagnosis not present

## 2018-10-26 DIAGNOSIS — I10 Essential (primary) hypertension: Secondary | ICD-10-CM | POA: Diagnosis not present

## 2018-10-26 DIAGNOSIS — N1831 Chronic kidney disease, stage 3a: Secondary | ICD-10-CM | POA: Diagnosis not present

## 2018-10-26 DIAGNOSIS — I340 Nonrheumatic mitral (valve) insufficiency: Secondary | ICD-10-CM | POA: Diagnosis not present

## 2018-10-26 DIAGNOSIS — Z79899 Other long term (current) drug therapy: Secondary | ICD-10-CM | POA: Diagnosis not present

## 2018-10-26 DIAGNOSIS — E1129 Type 2 diabetes mellitus with other diabetic kidney complication: Secondary | ICD-10-CM | POA: Diagnosis not present

## 2018-10-26 DIAGNOSIS — Z87891 Personal history of nicotine dependence: Secondary | ICD-10-CM | POA: Diagnosis not present

## 2018-10-26 DIAGNOSIS — E559 Vitamin D deficiency, unspecified: Secondary | ICD-10-CM | POA: Diagnosis present

## 2018-10-26 DIAGNOSIS — I200 Unstable angina: Secondary | ICD-10-CM | POA: Diagnosis not present

## 2018-10-26 HISTORY — PX: CORONARY ARTERY BYPASS GRAFT: SHX141

## 2018-10-26 LAB — POCT I-STAT 7, (LYTES, BLD GAS, ICA,H+H)
Acid-Base Excess: 2 mmol/L (ref 0.0–2.0)
Acid-base deficit: 3 mmol/L — ABNORMAL HIGH (ref 0.0–2.0)
Acid-base deficit: 3 mmol/L — ABNORMAL HIGH (ref 0.0–2.0)
Acid-base deficit: 2 mmol/L (ref 0.0–2.0)
Acid-base deficit: 2 mmol/L (ref 0.0–2.0)
Bicarbonate: 27 mmol/L (ref 20.0–28.0)
Bicarbonate: 22.5 mmol/L (ref 20.0–28.0)
Bicarbonate: 24.1 mmol/L (ref 20.0–28.0)
Bicarbonate: 24.4 mmol/L (ref 20.0–28.0)
Bicarbonate: 24.1 mmol/L (ref 20.0–28.0)
Calcium, Ion: 1.08 mmol/L — ABNORMAL LOW (ref 1.15–1.40)
Calcium, Ion: 1.34 mmol/L (ref 1.15–1.40)
Calcium, Ion: 1.34 mmol/L (ref 1.15–1.40)
Calcium, Ion: 1.31 mmol/L (ref 1.15–1.40)
Calcium, Ion: 1.3 mmol/L (ref 1.15–1.40)
HCT: 30 % — ABNORMAL LOW (ref 39.0–52.0)
HCT: 29 % — ABNORMAL LOW (ref 39.0–52.0)
HCT: 29 % — ABNORMAL LOW (ref 39.0–52.0)
HCT: 30 % — ABNORMAL LOW (ref 39.0–52.0)
HCT: 30 % — ABNORMAL LOW (ref 39.0–52.0)
Hemoglobin: 10.2 g/dL — ABNORMAL LOW (ref 13.0–17.0)
Hemoglobin: 9.9 g/dL — ABNORMAL LOW (ref 13.0–17.0)
Hemoglobin: 9.9 g/dL — ABNORMAL LOW (ref 13.0–17.0)
Hemoglobin: 10.2 g/dL — ABNORMAL LOW (ref 13.0–17.0)
Hemoglobin: 10.2 g/dL — ABNORMAL LOW (ref 13.0–17.0)
O2 Saturation: 100 %
O2 Saturation: 99 %
O2 Saturation: 97 %
O2 Saturation: 98 %
O2 Saturation: 99 %
Patient temperature: 35.8
Patient temperature: 36.9
Patient temperature: 36.7
Potassium: 4.8 mmol/L (ref 3.5–5.1)
Potassium: 5 mmol/L (ref 3.5–5.1)
Potassium: 4.8 mmol/L (ref 3.5–5.1)
Potassium: 5 mmol/L (ref 3.5–5.1)
Potassium: 4.8 mmol/L (ref 3.5–5.1)
Sodium: 139 mmol/L (ref 135–145)
Sodium: 138 mmol/L (ref 135–145)
Sodium: 140 mmol/L (ref 135–145)
Sodium: 140 mmol/L (ref 135–145)
Sodium: 141 mmol/L (ref 135–145)
TCO2: 28 mmol/L (ref 22–32)
TCO2: 24 mmol/L (ref 22–32)
TCO2: 26 mmol/L (ref 22–32)
TCO2: 26 mmol/L (ref 22–32)
TCO2: 25 mmol/L (ref 22–32)
pCO2 arterial: 43 mmHg (ref 32.0–48.0)
pCO2 arterial: 41.6 mmHg (ref 32.0–48.0)
pCO2 arterial: 47.6 mmHg (ref 32.0–48.0)
pCO2 arterial: 49.6 mmHg — ABNORMAL HIGH (ref 32.0–48.0)
pCO2 arterial: 45.8 mmHg (ref 32.0–48.0)
pH, Arterial: 7.405 (ref 7.350–7.450)
pH, Arterial: 7.341 — ABNORMAL LOW (ref 7.350–7.450)
pH, Arterial: 7.307 — ABNORMAL LOW (ref 7.350–7.450)
pH, Arterial: 7.3 — ABNORMAL LOW (ref 7.350–7.450)
pH, Arterial: 7.328 — ABNORMAL LOW (ref 7.350–7.450)
pO2, Arterial: 370 mmHg — ABNORMAL HIGH (ref 83.0–108.0)
pO2, Arterial: 140 mmHg — ABNORMAL HIGH (ref 83.0–108.0)
pO2, Arterial: 96 mmHg (ref 83.0–108.0)
pO2, Arterial: 116 mmHg — ABNORMAL HIGH (ref 83.0–108.0)
pO2, Arterial: 132 mmHg — ABNORMAL HIGH (ref 83.0–108.0)

## 2018-10-26 LAB — POCT I-STAT, CHEM 8
BUN: 22 mg/dL (ref 8–23)
BUN: 21 mg/dL (ref 8–23)
BUN: 20 mg/dL (ref 8–23)
BUN: 22 mg/dL (ref 8–23)
BUN: 22 mg/dL (ref 8–23)
Calcium, Ion: 1.31 mmol/L (ref 1.15–1.40)
Calcium, Ion: 1.3 mmol/L (ref 1.15–1.40)
Calcium, Ion: 1.09 mmol/L — ABNORMAL LOW (ref 1.15–1.40)
Calcium, Ion: 1.36 mmol/L (ref 1.15–1.40)
Calcium, Ion: 1.41 mmol/L — ABNORMAL HIGH (ref 1.15–1.40)
Chloride: 106 mmol/L (ref 98–111)
Chloride: 106 mmol/L (ref 98–111)
Chloride: 103 mmol/L (ref 98–111)
Chloride: 104 mmol/L (ref 98–111)
Chloride: 104 mmol/L (ref 98–111)
Creatinine, Ser: 1.3 mg/dL — ABNORMAL HIGH (ref 0.61–1.24)
Creatinine, Ser: 1.2 mg/dL (ref 0.61–1.24)
Creatinine, Ser: 1.1 mg/dL (ref 0.61–1.24)
Creatinine, Ser: 1.1 mg/dL (ref 0.61–1.24)
Creatinine, Ser: 1.1 mg/dL (ref 0.61–1.24)
Glucose, Bld: 140 mg/dL — ABNORMAL HIGH (ref 70–99)
Glucose, Bld: 152 mg/dL — ABNORMAL HIGH (ref 70–99)
Glucose, Bld: 142 mg/dL — ABNORMAL HIGH (ref 70–99)
Glucose, Bld: 131 mg/dL — ABNORMAL HIGH (ref 70–99)
Glucose, Bld: 143 mg/dL — ABNORMAL HIGH (ref 70–99)
HCT: 35 % — ABNORMAL LOW (ref 39.0–52.0)
HCT: 34 % — ABNORMAL LOW (ref 39.0–52.0)
HCT: 27 % — ABNORMAL LOW (ref 39.0–52.0)
HCT: 27 % — ABNORMAL LOW (ref 39.0–52.0)
HCT: 28 % — ABNORMAL LOW (ref 39.0–52.0)
Hemoglobin: 11.9 g/dL — ABNORMAL LOW (ref 13.0–17.0)
Hemoglobin: 11.6 g/dL — ABNORMAL LOW (ref 13.0–17.0)
Hemoglobin: 9.2 g/dL — ABNORMAL LOW (ref 13.0–17.0)
Hemoglobin: 9.2 g/dL — ABNORMAL LOW (ref 13.0–17.0)
Hemoglobin: 9.5 g/dL — ABNORMAL LOW (ref 13.0–17.0)
Potassium: 4.5 mmol/L (ref 3.5–5.1)
Potassium: 4.7 mmol/L (ref 3.5–5.1)
Potassium: 4.8 mmol/L (ref 3.5–5.1)
Potassium: 5.1 mmol/L (ref 3.5–5.1)
Potassium: 5.6 mmol/L — ABNORMAL HIGH (ref 3.5–5.1)
Sodium: 140 mmol/L (ref 135–145)
Sodium: 139 mmol/L (ref 135–145)
Sodium: 140 mmol/L (ref 135–145)
Sodium: 136 mmol/L (ref 135–145)
Sodium: 138 mmol/L (ref 135–145)
TCO2: 25 mmol/L (ref 22–32)
TCO2: 24 mmol/L (ref 22–32)
TCO2: 26 mmol/L (ref 22–32)
TCO2: 23 mmol/L (ref 22–32)
TCO2: 24 mmol/L (ref 22–32)

## 2018-10-26 LAB — CBC
HCT: 35 % — ABNORMAL LOW (ref 39.0–52.0)
HCT: 29.8 % — ABNORMAL LOW (ref 39.0–52.0)
HCT: 29.9 % — ABNORMAL LOW (ref 39.0–52.0)
Hemoglobin: 11.7 g/dL — ABNORMAL LOW (ref 13.0–17.0)
Hemoglobin: 9.4 g/dL — ABNORMAL LOW (ref 13.0–17.0)
Hemoglobin: 10 g/dL — ABNORMAL LOW (ref 13.0–17.0)
MCH: 28 pg (ref 26.0–34.0)
MCH: 27.2 pg (ref 26.0–34.0)
MCH: 28.2 pg (ref 26.0–34.0)
MCHC: 33.4 g/dL (ref 30.0–36.0)
MCHC: 31.5 g/dL (ref 30.0–36.0)
MCHC: 33.4 g/dL (ref 30.0–36.0)
MCV: 83.7 fL (ref 80.0–100.0)
MCV: 86.1 fL (ref 80.0–100.0)
MCV: 84.2 fL (ref 80.0–100.0)
Platelets: 200 10*3/uL (ref 150–400)
Platelets: 151 10*3/uL (ref 150–400)
Platelets: 148 10*3/uL — ABNORMAL LOW (ref 150–400)
RBC: 4.18 MIL/uL — ABNORMAL LOW (ref 4.22–5.81)
RBC: 3.46 MIL/uL — ABNORMAL LOW (ref 4.22–5.81)
RBC: 3.55 MIL/uL — ABNORMAL LOW (ref 4.22–5.81)
RDW: 13.7 % (ref 11.5–15.5)
RDW: 13.5 % (ref 11.5–15.5)
RDW: 13.4 % (ref 11.5–15.5)
WBC: 4.7 10*3/uL (ref 4.0–10.5)
WBC: 10.7 10*3/uL — ABNORMAL HIGH (ref 4.0–10.5)
WBC: 9.8 10*3/uL (ref 4.0–10.5)
nRBC: 0 % (ref 0.0–0.2)
nRBC: 0 % (ref 0.0–0.2)
nRBC: 0 % (ref 0.0–0.2)

## 2018-10-26 LAB — BASIC METABOLIC PANEL
Anion gap: 9 (ref 5–15)
Anion gap: 8 (ref 5–15)
BUN: 23 mg/dL (ref 8–23)
BUN: 19 mg/dL (ref 8–23)
CO2: 22 mmol/L (ref 22–32)
CO2: 22 mmol/L (ref 22–32)
Calcium: 9 mg/dL (ref 8.9–10.3)
Calcium: 8.6 mg/dL — ABNORMAL LOW (ref 8.9–10.3)
Chloride: 107 mmol/L (ref 98–111)
Chloride: 107 mmol/L (ref 98–111)
Creatinine, Ser: 1.39 mg/dL — ABNORMAL HIGH (ref 0.61–1.24)
Creatinine, Ser: 1.36 mg/dL — ABNORMAL HIGH (ref 0.61–1.24)
GFR calc Af Amer: >60 mL/min (ref >60)
GFR calc Af Amer: >60 mL/min (ref >60)
GFR calc non Af Amer: 54 mL/min — ABNORMAL LOW (ref >60)
GFR calc non Af Amer: 55 mL/min — ABNORMAL LOW (ref >60)
Glucose, Bld: 128 mg/dL — ABNORMAL HIGH (ref 70–99)
Glucose, Bld: 121 mg/dL — ABNORMAL HIGH (ref 70–99)
Potassium: 4.1 mmol/L (ref 3.5–5.1)
Potassium: 4.4 mmol/L (ref 3.5–5.1)
Sodium: 138 mmol/L (ref 135–145)
Sodium: 137 mmol/L (ref 135–145)

## 2018-10-26 LAB — GLUCOSE, CAPILLARY
Glucose-Capillary: 114 mg/dL — ABNORMAL HIGH (ref 70–99)
Glucose-Capillary: 122 mg/dL — ABNORMAL HIGH (ref 70–99)
Glucose-Capillary: 118 mg/dL — ABNORMAL HIGH (ref 70–99)
Glucose-Capillary: 125 mg/dL — ABNORMAL HIGH (ref 70–99)
Glucose-Capillary: 127 mg/dL — ABNORMAL HIGH (ref 70–99)
Glucose-Capillary: 110 mg/dL — ABNORMAL HIGH (ref 70–99)
Glucose-Capillary: 120 mg/dL — ABNORMAL HIGH (ref 70–99)
Glucose-Capillary: 148 mg/dL — ABNORMAL HIGH (ref 70–99)
Glucose-Capillary: 120 mg/dL — ABNORMAL HIGH (ref 70–99)
Glucose-Capillary: 115 mg/dL — ABNORMAL HIGH (ref 70–99)

## 2018-10-26 LAB — ECHO INTRAOPERATIVE TEE
Height: 71 in
Weight: 3529.6 oz

## 2018-10-26 LAB — HEMOGLOBIN A1C
Hgb A1c MFr Bld: 6.6 % — ABNORMAL HIGH (ref 4.8–5.6)
Mean Plasma Glucose: 142.72 mg/dL

## 2018-10-26 LAB — APTT
aPTT: 67 seconds — ABNORMAL HIGH (ref 24–36)
aPTT: 24 seconds (ref 24–36)

## 2018-10-26 LAB — PLATELET COUNT: Platelets: 159 10*3/uL (ref 150–400)

## 2018-10-26 LAB — HEMOGLOBIN AND HEMATOCRIT, BLOOD
HCT: 27.4 % — ABNORMAL LOW (ref 39.0–52.0)
Hemoglobin: 9.2 g/dL — ABNORMAL LOW (ref 13.0–17.0)

## 2018-10-26 LAB — PROTIME-INR
INR: 1.1 (ref 0.8–1.2)
INR: 1.2 (ref 0.8–1.2)
Prothrombin Time: 13.6 seconds (ref 11.4–15.2)
Prothrombin Time: 15.2 seconds (ref 11.4–15.2)

## 2018-10-26 LAB — MAGNESIUM: Magnesium: 2.5 mg/dL — ABNORMAL HIGH (ref 1.7–2.4)

## 2018-10-26 SURGERY — CORONARY ARTERY BYPASS GRAFTING (CABG)
Anesthesia: General | Site: Chest

## 2018-10-26 MED ORDER — ROCURONIUM BROMIDE 10 MG/ML (PF) SYRINGE
PREFILLED_SYRINGE | INTRAVENOUS | Status: DC | PRN
  Administered 2018-10-26 (×2): 50 mg via INTRAVENOUS
  Administered 2018-10-26: 100 mg via INTRAVENOUS

## 2018-10-26 MED ORDER — SODIUM CHLORIDE 0.9% FLUSH
10.0000 mL | Freq: Two times a day (BID) | INTRAVENOUS | Status: DC
  Administered 2018-10-26 – 2018-10-28 (×2): 10 mL

## 2018-10-26 MED ORDER — SODIUM CHLORIDE 0.9 % IV SOLN: INTRAVENOUS | Status: DC

## 2018-10-26 MED ORDER — SODIUM CHLORIDE 0.9 % IV SOLN
1.5000 g | Freq: Two times a day (BID) | INTRAVENOUS | Status: AC
  Administered 2018-10-26 – 2018-10-28 (×4): 1.5 g via INTRAVENOUS
  Filled 2018-10-26 (×4): qty 1.5

## 2018-10-26 MED ORDER — PROTAMINE SULFATE 10 MG/ML IV SOLN
INTRAVENOUS | Status: DC | PRN
  Administered 2018-10-26 (×2): 50 mg via INTRAVENOUS
  Administered 2018-10-26: 60 mg via INTRAVENOUS
  Administered 2018-10-26 (×2): 50 mg via INTRAVENOUS
  Administered 2018-10-26: 30 mg via INTRAVENOUS
  Administered 2018-10-26: 60 mg via INTRAVENOUS

## 2018-10-26 MED ORDER — MIDAZOLAM HCL 2 MG/2ML IJ SOLN: 2.0000 mg | INTRAMUSCULAR | Status: DC | PRN

## 2018-10-26 MED ORDER — 0.9 % SODIUM CHLORIDE (POUR BTL) OPTIME
TOPICAL | Status: DC | PRN
  Administered 2018-10-26: 5000 mL

## 2018-10-26 MED ORDER — INSULIN REGULAR(HUMAN) IN NACL 100-0.9 UT/100ML-% IV SOLN: INTRAVENOUS | Status: DC

## 2018-10-26 MED ORDER — BISACODYL 5 MG PO TBEC
10.0000 mg | DELAYED_RELEASE_TABLET | Freq: Every day | ORAL | Status: DC
  Administered 2018-10-27 – 2018-10-31 (×4): 10 mg via ORAL
  Filled 2018-10-26 (×5): qty 2

## 2018-10-26 MED ORDER — SUCCINYLCHOLINE CHLORIDE 200 MG/10ML IV SOSY
PREFILLED_SYRINGE | INTRAVENOUS | Status: AC
  Filled 2018-10-26: qty 10

## 2018-10-26 MED ORDER — HEPARIN SODIUM (PORCINE) 1000 UNIT/ML IJ SOLN
INTRAMUSCULAR | Status: AC
  Filled 2018-10-26: qty 1

## 2018-10-26 MED ORDER — ONDANSETRON HCL 4 MG/2ML IJ SOLN
4.0000 mg | Freq: Four times a day (QID) | INTRAMUSCULAR | Status: DC | PRN
  Administered 2018-10-27: 4 mg via INTRAVENOUS
  Filled 2018-10-26: qty 2

## 2018-10-26 MED ORDER — CHLORHEXIDINE GLUCONATE CLOTH 2 % EX PADS
6.0000 | MEDICATED_PAD | Freq: Every day | CUTANEOUS | Status: DC
  Administered 2018-10-26 – 2018-10-29 (×3): 6 via TOPICAL

## 2018-10-26 MED ORDER — FENTANYL CITRATE (PF) 250 MCG/5ML IJ SOLN
INTRAMUSCULAR | Status: AC
  Filled 2018-10-26: qty 25

## 2018-10-26 MED ORDER — MIDAZOLAM HCL 5 MG/5ML IJ SOLN
INTRAMUSCULAR | Status: DC | PRN
  Administered 2018-10-26 (×2): 1 mg via INTRAVENOUS
  Administered 2018-10-26: 4 mg via INTRAVENOUS
  Administered 2018-10-26 (×2): 1 mg via INTRAVENOUS
  Administered 2018-10-26: 2 mg via INTRAVENOUS

## 2018-10-26 MED ORDER — ALBUMIN HUMAN 5 % IV SOLN: 250.0000 mL | INTRAVENOUS | Status: AC | PRN

## 2018-10-26 MED ORDER — PROPOFOL 10 MG/ML IV BOLUS
INTRAVENOUS | Status: DC | PRN
  Administered 2018-10-26: 40 mg via INTRAVENOUS
  Administered 2018-10-26: 30 mg via INTRAVENOUS

## 2018-10-26 MED ORDER — DOCUSATE SODIUM 100 MG PO CAPS
200.0000 mg | ORAL_CAPSULE | Freq: Every day | ORAL | Status: DC
  Administered 2018-10-27 – 2018-10-31 (×4): 200 mg via ORAL
  Filled 2018-10-26 (×5): qty 2

## 2018-10-26 MED ORDER — LACTATED RINGERS IV SOLN
INTRAVENOUS | Status: DC
  Administered 2018-10-26: 13:00:00 via INTRAVENOUS

## 2018-10-26 MED ORDER — LACTATED RINGERS IV SOLN
INTRAVENOUS | Status: DC | PRN
  Administered 2018-10-26: 08:00:00 via INTRAVENOUS

## 2018-10-26 MED ORDER — ACETAMINOPHEN 500 MG PO TABS
1000.0000 mg | ORAL_TABLET | Freq: Four times a day (QID) | ORAL | Status: DC
  Administered 2018-10-27 – 2018-10-31 (×17): 1000 mg via ORAL
  Filled 2018-10-26 (×18): qty 2

## 2018-10-26 MED ORDER — SODIUM CHLORIDE 0.9 % IV SOLN: 250.0000 mL | INTRAVENOUS | Status: DC

## 2018-10-26 MED ORDER — PHENYLEPHRINE 40 MCG/ML (10ML) SYRINGE FOR IV PUSH (FOR BLOOD PRESSURE SUPPORT)
PREFILLED_SYRINGE | INTRAVENOUS | Status: DC | PRN
  Administered 2018-10-26 (×2): 40 ug via INTRAVENOUS
  Administered 2018-10-26 (×3): 80 ug via INTRAVENOUS

## 2018-10-26 MED ORDER — ASPIRIN 81 MG PO CHEW: 324.0000 mg | CHEWABLE_TABLET | Freq: Every day | ORAL | Status: DC

## 2018-10-26 MED ORDER — BISACODYL 10 MG RE SUPP: 10.0000 mg | Freq: Every day | RECTAL | Status: DC

## 2018-10-26 MED ORDER — NOREPINEPHRINE 4 MG/250ML-% IV SOLN
0.0000 ug/min | INTRAVENOUS | Status: DC
  Filled 2018-10-26: qty 250

## 2018-10-26 MED ORDER — PROTAMINE SULFATE 10 MG/ML IV SOLN
INTRAVENOUS | Status: AC
  Filled 2018-10-26: qty 5

## 2018-10-26 MED ORDER — MIDAZOLAM HCL (PF) 10 MG/2ML IJ SOLN
INTRAMUSCULAR | Status: AC
  Filled 2018-10-26: qty 2

## 2018-10-26 MED ORDER — NOREPINEPHRINE 4 MG/250ML-% IV SOLN: 0.0000 ug/min | INTRAVENOUS | Status: DC

## 2018-10-26 MED ORDER — ROCURONIUM BROMIDE 10 MG/ML (PF) SYRINGE
PREFILLED_SYRINGE | INTRAVENOUS | Status: AC
  Filled 2018-10-26: qty 10

## 2018-10-26 MED ORDER — SODIUM CHLORIDE 0.9 % IV SOLN
INTRAVENOUS | Status: DC | PRN
  Administered 2018-10-26: 13:00:00 via INTRAVENOUS

## 2018-10-26 MED ORDER — SODIUM CHLORIDE 0.9% FLUSH: 3.0000 mL | INTRAVENOUS | Status: DC | PRN

## 2018-10-26 MED ORDER — OXYCODONE HCL 5 MG PO TABS
5.0000 mg | ORAL_TABLET | ORAL | Status: DC | PRN
  Administered 2018-10-26: 10 mg via ORAL
  Administered 2018-10-27: 5 mg via ORAL
  Administered 2018-10-27 (×2): 10 mg via ORAL
  Administered 2018-10-27 (×2): 5 mg via ORAL
  Administered 2018-10-27: 10 mg via ORAL
  Administered 2018-10-28 (×2): 5 mg via ORAL
  Filled 2018-10-26 (×3): qty 1
  Filled 2018-10-26: qty 2
  Filled 2018-10-26: qty 1
  Filled 2018-10-26 (×3): qty 2
  Filled 2018-10-26: qty 1

## 2018-10-26 MED ORDER — SODIUM CHLORIDE 0.45 % IV SOLN: INTRAVENOUS | Status: DC | PRN

## 2018-10-26 MED ORDER — ARTIFICIAL TEARS OPHTHALMIC OINT
TOPICAL_OINTMENT | OPHTHALMIC | Status: AC
  Filled 2018-10-26: qty 3.5

## 2018-10-26 MED ORDER — SODIUM CHLORIDE 0.9 % IV SOLN: INTRAVENOUS | Status: DC | PRN

## 2018-10-26 MED ORDER — TRAMADOL HCL 50 MG PO TABS
50.0000 mg | ORAL_TABLET | ORAL | Status: DC | PRN
  Administered 2018-10-28: 50 mg via ORAL
  Administered 2018-10-30 (×2): 100 mg via ORAL
  Filled 2018-10-26: qty 1
  Filled 2018-10-26 (×2): qty 2

## 2018-10-26 MED ORDER — METOPROLOL TARTRATE 12.5 MG HALF TABLET
12.5000 mg | ORAL_TABLET | Freq: Two times a day (BID) | ORAL | Status: DC
  Administered 2018-10-26 – 2018-10-27 (×3): 12.5 mg via ORAL
  Filled 2018-10-26 (×3): qty 1

## 2018-10-26 MED ORDER — MORPHINE SULFATE (PF) 2 MG/ML IV SOLN
1.0000 mg | INTRAVENOUS | Status: DC | PRN
  Administered 2018-10-26 (×2): 2 mg via INTRAVENOUS
  Filled 2018-10-26 (×2): qty 1

## 2018-10-26 MED ORDER — LACTATED RINGERS IV SOLN: INTRAVENOUS | Status: DC

## 2018-10-26 MED ORDER — POTASSIUM CHLORIDE 10 MEQ/50ML IV SOLN: 10.0000 meq | INTRAVENOUS | Status: AC

## 2018-10-26 MED ORDER — EPHEDRINE SULFATE-NACL 50-0.9 MG/10ML-% IV SOSY
PREFILLED_SYRINGE | INTRAVENOUS | Status: DC | PRN
  Administered 2018-10-26 (×2): 5 mg via INTRAVENOUS
  Administered 2018-10-26: 10 mg via INTRAVENOUS

## 2018-10-26 MED ORDER — FENTANYL CITRATE (PF) 250 MCG/5ML IJ SOLN
INTRAMUSCULAR | Status: DC | PRN
  Administered 2018-10-26: 50 ug via INTRAVENOUS
  Administered 2018-10-26 (×2): 100 ug via INTRAVENOUS
  Administered 2018-10-26: 150 ug via INTRAVENOUS
  Administered 2018-10-26: 100 ug via INTRAVENOUS
  Administered 2018-10-26: 250 ug via INTRAVENOUS
  Administered 2018-10-26: 100 ug via INTRAVENOUS
  Administered 2018-10-26: 50 ug via INTRAVENOUS
  Administered 2018-10-26: 300 ug via INTRAVENOUS
  Administered 2018-10-26: 50 ug via INTRAVENOUS

## 2018-10-26 MED ORDER — DEXMEDETOMIDINE HCL IN NACL 400 MCG/100ML IV SOLN: 0.0000 ug/kg/h | INTRAVENOUS | Status: DC

## 2018-10-26 MED ORDER — SODIUM CHLORIDE 0.9% FLUSH
3.0000 mL | Freq: Two times a day (BID) | INTRAVENOUS | Status: DC
  Administered 2018-10-27 – 2018-10-28 (×2): 3 mL via INTRAVENOUS

## 2018-10-26 MED ORDER — HEPARIN SODIUM (PORCINE) 1000 UNIT/ML IJ SOLN
INTRAMUSCULAR | Status: DC | PRN
  Administered 2018-10-26: 35000 [IU] via INTRAVENOUS

## 2018-10-26 MED ORDER — NICARDIPINE HCL IN NACL 20-0.86 MG/200ML-% IV SOLN
3.0000 mg/h | INTRAVENOUS | Status: DC
  Filled 2018-10-26: qty 200

## 2018-10-26 MED ORDER — ACETAMINOPHEN 160 MG/5ML PO SOLN: 650.0000 mg | Freq: Once | ORAL | Status: AC

## 2018-10-26 MED ORDER — ORAL CARE MOUTH RINSE
15.0000 mL | OROMUCOSAL | Status: DC
  Administered 2018-10-26 (×2): 15 mL via OROMUCOSAL

## 2018-10-26 MED ORDER — PHENYLEPHRINE HCL-NACL 10-0.9 MG/250ML-% IV SOLN: 0.0000 ug/min | INTRAVENOUS | Status: DC

## 2018-10-26 MED ORDER — METOPROLOL TARTRATE 5 MG/5ML IV SOLN: 2.5000 mg | INTRAVENOUS | Status: DC | PRN

## 2018-10-26 MED ORDER — CHLORHEXIDINE GLUCONATE 0.12% ORAL RINSE (MEDLINE KIT)
15.0000 mL | Freq: Two times a day (BID) | OROMUCOSAL | Status: DC
  Administered 2018-10-26 – 2018-10-27 (×3): 15 mL via OROMUCOSAL

## 2018-10-26 MED ORDER — INSULIN REGULAR BOLUS VIA INFUSION
0.0000 [IU] | Freq: Three times a day (TID) | INTRAVENOUS | Status: DC
  Filled 2018-10-26: qty 10

## 2018-10-26 MED ORDER — LACTATED RINGERS IV SOLN: 500.0000 mL | Freq: Once | INTRAVENOUS | Status: DC | PRN

## 2018-10-26 MED ORDER — VANCOMYCIN HCL IN DEXTROSE 1-5 GM/200ML-% IV SOLN
1000.0000 mg | Freq: Once | INTRAVENOUS | Status: AC
  Administered 2018-10-26: 1000 mg via INTRAVENOUS
  Filled 2018-10-26: qty 200

## 2018-10-26 MED ORDER — ACETAMINOPHEN 650 MG RE SUPP
650.0000 mg | Freq: Once | RECTAL | Status: AC
  Administered 2018-10-26: 650 mg via RECTAL

## 2018-10-26 MED ORDER — PROPOFOL 10 MG/ML IV BOLUS
INTRAVENOUS | Status: AC
  Filled 2018-10-26: qty 20

## 2018-10-26 MED ORDER — MAGNESIUM SULFATE 4 GM/100ML IV SOLN
4.0000 g | Freq: Once | INTRAVENOUS | Status: AC
  Administered 2018-10-26: 14:00:00 4 g via INTRAVENOUS
  Filled 2018-10-26: qty 100

## 2018-10-26 MED ORDER — ACETAMINOPHEN 160 MG/5ML PO SOLN: 1000.0000 mg | Freq: Four times a day (QID) | ORAL | Status: DC

## 2018-10-26 MED ORDER — EPHEDRINE 5 MG/ML INJ
INTRAVENOUS | Status: AC
  Filled 2018-10-26: qty 10

## 2018-10-26 MED ORDER — PHENYLEPHRINE 40 MCG/ML (10ML) SYRINGE FOR IV PUSH (FOR BLOOD PRESSURE SUPPORT)
PREFILLED_SYRINGE | INTRAVENOUS | Status: AC
  Filled 2018-10-26: qty 10

## 2018-10-26 MED ORDER — SODIUM CHLORIDE 0.9 % IV SOLN
INTRAVENOUS | Status: DC | PRN
  Administered 2018-10-26: 750 mg via INTRAVENOUS

## 2018-10-26 MED ORDER — ALBUMIN HUMAN 5 % IV SOLN
INTRAVENOUS | Status: DC | PRN
  Administered 2018-10-26: 12:00:00 via INTRAVENOUS

## 2018-10-26 MED ORDER — ASPIRIN EC 325 MG PO TBEC
325.0000 mg | DELAYED_RELEASE_TABLET | Freq: Every day | ORAL | Status: DC
  Administered 2018-10-27 – 2018-10-31 (×5): 325 mg via ORAL
  Filled 2018-10-26 (×5): qty 1

## 2018-10-26 MED ORDER — CHLORHEXIDINE GLUCONATE 0.12 % MT SOLN
15.0000 mL | OROMUCOSAL | Status: AC
  Administered 2018-10-26: 15 mL via OROMUCOSAL

## 2018-10-26 MED ORDER — PROTAMINE SULFATE 10 MG/ML IV SOLN
INTRAVENOUS | Status: AC
  Filled 2018-10-26: qty 25

## 2018-10-26 MED ORDER — LIDOCAINE 2% (20 MG/ML) 5 ML SYRINGE
INTRAMUSCULAR | Status: AC
  Filled 2018-10-26: qty 5

## 2018-10-26 MED ORDER — ARTIFICIAL TEARS OPHTHALMIC OINT
TOPICAL_OINTMENT | OPHTHALMIC | Status: DC | PRN
  Administered 2018-10-26: 1 via OPHTHALMIC

## 2018-10-26 MED ORDER — METOPROLOL TARTRATE 25 MG/10 ML ORAL SUSPENSION: 12.5000 mg | Freq: Two times a day (BID) | ORAL | Status: DC

## 2018-10-26 MED ORDER — CHLORHEXIDINE GLUCONATE CLOTH 2 % EX PADS: 6.0000 | MEDICATED_PAD | Freq: Every day | CUTANEOUS | Status: DC

## 2018-10-26 MED ORDER — SODIUM CHLORIDE 0.9% FLUSH: 10.0000 mL | INTRAVENOUS | Status: DC | PRN

## 2018-10-26 MED ORDER — HEMOSTATIC AGENTS (NO CHARGE) OPTIME
TOPICAL | Status: DC | PRN
  Administered 2018-10-26 (×3): 1 via TOPICAL

## 2018-10-26 MED ORDER — PANTOPRAZOLE SODIUM 40 MG PO TBEC
40.0000 mg | DELAYED_RELEASE_TABLET | Freq: Every day | ORAL | Status: DC
  Administered 2018-10-28 – 2018-10-31 (×4): 40 mg via ORAL
  Filled 2018-10-26 (×4): qty 1

## 2018-10-26 MED ORDER — FAMOTIDINE IN NACL 20-0.9 MG/50ML-% IV SOLN
20.0000 mg | Freq: Two times a day (BID) | INTRAVENOUS | Status: AC
  Administered 2018-10-26: 20 mg via INTRAVENOUS

## 2018-10-26 SURGICAL SUPPLY — 106 items
BAG DECANTER FOR FLEXI CONT (MISCELLANEOUS) ×4 IMPLANT
BASKET HEART  (ORDER IN 25'S) (MISCELLANEOUS) ×1
BASKET HEART (ORDER IN 25'S) (MISCELLANEOUS) ×1
BASKET HEART (ORDER IN 25S) (MISCELLANEOUS) ×1 IMPLANT
BLADE 11 SAFETY STRL DISP (BLADE) ×2 IMPLANT
BLADE CLIPPER SURG (BLADE) ×3 IMPLANT
BLADE STERNUM SYSTEM 6 (BLADE) ×3 IMPLANT
BNDG ELASTIC 4X5.8 VLCR STR LF (GAUZE/BANDAGES/DRESSINGS) ×3 IMPLANT
BNDG ELASTIC 6X5.8 VLCR STR LF (GAUZE/BANDAGES/DRESSINGS) ×3 IMPLANT
BNDG GAUZE ELAST 4 BULKY (GAUZE/BANDAGES/DRESSINGS) ×3 IMPLANT
CABLE SURGICAL S-101-97-12 (CABLE) ×2 IMPLANT
CANISTER SUCT 3000ML PPV (MISCELLANEOUS) ×3 IMPLANT
CANNULA EZ GLIDE 8.0 24FR (CANNULA) ×3 IMPLANT
CANNULA MC2 2 STG 29/37 NON-V (CANNULA) ×1 IMPLANT
CANNULA MC2 TWO STAGE (CANNULA) ×2
CATH CPB KIT HENDRICKSON (MISCELLANEOUS) ×3 IMPLANT
CATH ROBINSON RED A/P 18FR (CATHETERS) ×4 IMPLANT
CLIP RETRACTION 3.0MM CORONARY (MISCELLANEOUS) ×1 IMPLANT
CLIP VESOCCLUDE MED 24/CT (CLIP) IMPLANT
CLIP VESOCCLUDE SM WIDE 24/CT (CLIP) IMPLANT
CONN ST 1/2X1/2  BEN (MISCELLANEOUS)
CONN ST 1/2X1/2 BEN (MISCELLANEOUS) ×1 IMPLANT
CONNECTOR BLAKE 2:1 CARIO BLK (MISCELLANEOUS) ×2 IMPLANT
COVER WAND RF STERILE (DRAPES) ×1 IMPLANT
DEFOGGER ANTIFOG KIT (MISCELLANEOUS) ×2 IMPLANT
DERMABOND ADVANCED (GAUZE/BANDAGES/DRESSINGS) ×2
DERMABOND ADVANCED .7 DNX12 (GAUZE/BANDAGES/DRESSINGS) IMPLANT
DRAIN CHANNEL 19F RND (DRAIN) ×6 IMPLANT
DRAIN CONNECTOR BLAKE 1:1 (MISCELLANEOUS) ×2 IMPLANT
DRAPE CARDIOVASCULAR INCISE (DRAPES) ×2
DRAPE INCISE IOBAN 66X45 STRL (DRAPES) ×1 IMPLANT
DRAPE SLUSH/WARMER DISC (DRAPES) ×3 IMPLANT
DRAPE SRG 135X102X78XABS (DRAPES) ×1 IMPLANT
DRSG COVADERM 4X14 (GAUZE/BANDAGES/DRESSINGS) ×3 IMPLANT
ELECT REM PT RETURN 9FT ADLT (ELECTROSURGICAL) ×6
ELECTRODE REM PT RTRN 9FT ADLT (ELECTROSURGICAL) ×2 IMPLANT
FELT TEFLON 1X6 (MISCELLANEOUS) ×4 IMPLANT
GAUZE SPONGE 4X4 12PLY STRL (GAUZE/BANDAGES/DRESSINGS) ×6 IMPLANT
GAUZE SPONGE 4X4 12PLY STRL LF (GAUZE/BANDAGES/DRESSINGS) ×4 IMPLANT
GLOVE BIO SURGEON STRL SZ 6 (GLOVE) ×2 IMPLANT
GLOVE BIO SURGEON STRL SZ 6.5 (GLOVE) ×5 IMPLANT
GLOVE BIO SURGEON STRL SZ7 (GLOVE) ×12 IMPLANT
GLOVE BIO SURGEON STRL SZ7.5 (GLOVE) ×2 IMPLANT
GLOVE BIO SURGEONS STRL SZ 6.5 (GLOVE) ×5
GLOVE BIOGEL PI IND STRL 6 (GLOVE) IMPLANT
GLOVE BIOGEL PI IND STRL 7.5 (GLOVE) ×2 IMPLANT
GLOVE BIOGEL PI INDICATOR 6 (GLOVE) ×2
GLOVE BIOGEL PI INDICATOR 7.5 (GLOVE) ×2
GLOVE SURG SS PI 7.5 STRL IVOR (GLOVE) ×4 IMPLANT
GOWN STRL REUS W/ TWL LRG LVL3 (GOWN DISPOSABLE) ×4 IMPLANT
GOWN STRL REUS W/ TWL XL LVL3 (GOWN DISPOSABLE) ×2 IMPLANT
GOWN STRL REUS W/TWL LRG LVL3 (GOWN DISPOSABLE) ×16
GOWN STRL REUS W/TWL XL LVL3 (GOWN DISPOSABLE) ×4
HEMOSTAT POWDER SURGIFOAM 1G (HEMOSTASIS) ×9 IMPLANT
KIT BASIN OR (CUSTOM PROCEDURE TRAY) ×3 IMPLANT
KIT SUCTION CATH 14FR (SUCTIONS) ×5 IMPLANT
KIT TURNOVER KIT B (KITS) ×3 IMPLANT
KIT VASOVIEW HEMOPRO 2 VH 4000 (KITS) ×3 IMPLANT
LEAD PACING MYOCARDI (MISCELLANEOUS) ×3 IMPLANT
MARKER GRAFT CORONARY BYPASS (MISCELLANEOUS) ×9 IMPLANT
NS IRRIG 1000ML POUR BTL (IV SOLUTION) ×15 IMPLANT
PACK E OPEN HEART (SUTURE) ×3 IMPLANT
PACK OPEN HEART (CUSTOM PROCEDURE TRAY) ×3 IMPLANT
PAD ARMBOARD 7.5X6 YLW CONV (MISCELLANEOUS) ×6 IMPLANT
PAD ELECT DEFIB RADIOL ZOLL (MISCELLANEOUS) ×3 IMPLANT
PENCIL BUTTON HOLSTER BLD 10FT (ELECTRODE) ×3 IMPLANT
PENCIL SMOKE EVACUATOR (MISCELLANEOUS) ×2 IMPLANT
POSITIONER HEAD DONUT 9IN (MISCELLANEOUS) ×3 IMPLANT
PUNCH AORTIC ROT 4.0MM RCL 40 (MISCELLANEOUS) ×2 IMPLANT
PUNCH AORTIC ROTATE 4.0MM (MISCELLANEOUS) IMPLANT
PUNCH AORTIC ROTATE 4.5MM 8IN (MISCELLANEOUS) IMPLANT
PUNCH AORTIC ROTATE 5MM 8IN (MISCELLANEOUS) IMPLANT
SET CARDIOPLEGIA MPS 5001102 (MISCELLANEOUS) ×2 IMPLANT
SPONGE LAP 18X18 RF (DISPOSABLE) ×2 IMPLANT
SPONGE LAP 4X18 RFD (DISPOSABLE) IMPLANT
SUPPORT HEART JANKE-BARRON (MISCELLANEOUS) ×2 IMPLANT
SUT BONE WAX W31G (SUTURE) ×3 IMPLANT
SUT MNCRL AB 3-0 PS2 18 (SUTURE) ×6 IMPLANT
SUT MNCRL AB 4-0 PS2 18 (SUTURE) ×6 IMPLANT
SUT PDS AB 1 CTX 36 (SUTURE) ×6 IMPLANT
SUT PROLENE 2 0 SH DA (SUTURE) IMPLANT
SUT PROLENE 3 0 SH DA (SUTURE) ×3 IMPLANT
SUT PROLENE 3 0 SH1 36 (SUTURE) IMPLANT
SUT PROLENE 4 0 RB 1 (SUTURE)
SUT PROLENE 4 0 SH DA (SUTURE) ×2 IMPLANT
SUT PROLENE 4-0 RB1 .5 CRCL 36 (SUTURE) IMPLANT
SUT PROLENE 5 0 C 1 36 (SUTURE) ×9 IMPLANT
SUT PROLENE 6 0 C 1 30 (SUTURE) IMPLANT
SUT PROLENE 7 0 BV 1 (SUTURE) ×4 IMPLANT
SUT PROLENE 7 0 BV1 MDA (SUTURE) ×2 IMPLANT
SUT PROLENE 8 0 BV175 6 (SUTURE) IMPLANT
SUT PROLENE BLUE 7 0 (SUTURE) ×3 IMPLANT
SUT PROLENE POLY MONO (SUTURE) IMPLANT
SUT STEEL 6MS V (SUTURE) ×2 IMPLANT
SUT STEEL STERNAL CCS#1 18IN (SUTURE) ×2 IMPLANT
SUT STEEL SZ 6 DBL 3X14 BALL (SUTURE) ×2 IMPLANT
SUT VIC AB 2-0 CT1 27 (SUTURE) ×2
SUT VIC AB 2-0 CT1 TAPERPNT 27 (SUTURE) IMPLANT
SYSTEM SAHARA CHEST DRAIN ATS (WOUND CARE) ×3 IMPLANT
TAPE CLOTH SURG 4X10 WHT LF (GAUZE/BANDAGES/DRESSINGS) ×2 IMPLANT
TOWEL GREEN STERILE (TOWEL DISPOSABLE) ×1 IMPLANT
TOWEL GREEN STERILE FF (TOWEL DISPOSABLE) ×3 IMPLANT
TRAY FOLEY SLVR 16FR TEMP STAT (SET/KITS/TRAYS/PACK) ×3 IMPLANT
TUBING LAP HI FLOW INSUFFLATIO (TUBING) ×3 IMPLANT
UNDERPAD 30X30 (UNDERPADS AND DIAPERS) ×3 IMPLANT
WATER STERILE IRR 1000ML POUR (IV SOLUTION) ×6 IMPLANT

## 2018-10-26 NOTE — Discharge Instructions (Signed)

## 2018-10-26 NOTE — Anesthesia Procedure Notes (Signed)
Procedure Name: Intubation Date/Time: 10/26/2018 8:50 AM Performed by: Harden Mo, CRNA Pre-anesthesia Checklist: Patient identified, Emergency Drugs available, Suction available and Patient being monitored Patient Re-evaluated:Patient Re-evaluated prior to induction Oxygen Delivery Method: Circle System Utilized Preoxygenation: Pre-oxygenation with 100% oxygen Induction Type: IV induction Ventilation: Mask ventilation without difficulty and Oral airway inserted - appropriate to patient size Laryngoscope Size: Mac and 4 Grade View: Grade I Tube type: Oral Tube size: 8.0 mm Number of attempts: 1 Airway Equipment and Method: Stylet and Oral airway Placement Confirmation: ETT inserted through vocal cords under direct vision,  positive ETCO2 and breath sounds checked- equal and bilateral Secured at: 23 cm Tube secured with: Tape Dental Injury: Teeth and Oropharynx as per pre-operative assessment

## 2018-10-26 NOTE — Progress Notes (Signed)
Patient in the OR for CABG. Appreciate CVTS.  Please let [facility] know if we can be of any further assistance. Eulogio Bear DO

## 2018-10-26 NOTE — Anesthesia Postprocedure Evaluation (Signed)
Anesthesia Post Note  Patient: [name]  Procedure(s) Performed: CORONARY ARTERY BYPASS GRAFTING (CABG) x3 using the LIMA to LAD and right greater saphenous vein via endoscopic harvest to the Diag 1 and OM 2. (N/A Chest)     Patient location during evaluation: SICU Anesthesia Type: General Level of consciousness: sedated and patient remains intubated per anesthesia plan Pain management: pain level controlled Vital Signs Assessment: post-procedure vital signs reviewed and stable Respiratory status: spontaneous breathing, patient on ventilator - see flowsheet for VS and patient remains intubated per anesthesia plan Cardiovascular status: stable Anesthetic complications: no    Last Vitals:  Vitals:   10/26/18 1500 10/26/18 1530  BP: 109/79   Pulse: 75 77  Resp: 11 (!) 9  Temp: 36.6 C 36.8 C  SpO2: 99% 97%    Last Pain:  Vitals:   10/26/18 1400  TempSrc: Core  PainSc:                  Nolon Nations

## 2018-10-26 NOTE — Anesthesia Procedure Notes (Signed)
Arterial Line Insertion Start/End10/21/2020 7:55 AM, 10/26/2018 8:10 AM Performed by: Harden Mo, CRNA, CRNA  Patient location: Pre-op. Preanesthetic checklist: patient identified, IV checked, site marked, risks and benefits discussed, surgical consent, monitors and equipment checked, pre-op evaluation and anesthesia consent Lidocaine 1% used for infiltration and patient sedated Left, radial was placed Catheter size: 20 G Hand hygiene performed  and maximum sterile barriers used  Greaser's test indicative of satisfactory collateral circulation Attempts: 3 Procedure performed without using ultrasound guided technique. Ultrasound Notes:anatomy identified, needle tip was noted to be adjacent to the nerve/plexus identified and no ultrasound evidence of intravascular and/or intraneural injection Following insertion, dressing applied and Biopatch. Post procedure assessment: normal  Patient tolerated the procedure well with no immediate complications.

## 2018-10-26 NOTE — Progress Notes (Signed)
     [address]       [place] [address]             [phone]       No events overnight.  Chest pain improved with heparin, and nitro gtt  Vitals:   10/26/18 0038 10/26/18 0429  BP: 114/84 138/74  Pulse: 65 63  Resp: 20 16  Temp: 97.9 F (36.6 C) 98.4 F (36.9 C)  SpO2: 100% 96%   Alert NAD RRR EWOB  Labs reviewed  63 yo male with LM CAD  OR today for CABG 2  [name]

## 2018-10-26 NOTE — Anesthesia Procedure Notes (Signed)
Central Venous Catheter Insertion Performed by: Nolon Nations, MD, anesthesiologist Start/End10/21/2020 8:00 AM, 10/26/2018 8:20 AM Patient location: Pre-op. Preanesthetic checklist: patient identified, IV checked, site marked, risks and benefits discussed, surgical consent, monitors and equipment checked, pre-op evaluation, timeout performed and anesthesia consent Lidocaine 1% used for infiltration and patient sedated Hand hygiene performed  and maximum sterile barriers used  Catheter size: 8.5 Fr Sheath introducer PA Cath depth:46 Procedure performed using ultrasound guided technique. Ultrasound Notes:anatomy identified, needle tip was noted to be adjacent to the nerve/plexus identified, no ultrasound evidence of intravascular and/or intraneural injection and image(s) printed for medical record Attempts: 1 Following insertion, line sutured, dressing applied and Biopatch. Post procedure assessment: blood return through all ports, free fluid flow and no air  Patient tolerated the procedure well with no immediate complications.

## 2018-10-26 NOTE — Progress Notes (Signed)
[facility] BRIEF SICU PROGRESS NOTE  Day of Surgery  S/P Procedure(s) (LRB): CORONARY ARTERY BYPASS GRAFTING (CABG) x3 using the LIMA to LAD and right greater saphenous vein via endoscopic harvest to the Diag 1 and OM 2. (N/A)   Stable early postop.  Extubated uneventfully Maintaining NSR w/ stable hemodynamics Breathing comfortably w/ O2 sats 98% UOP > 100 mL/hr Chest tube output low Labs okay  Plan: Continue routine early postop  Rexene Alberts, MD 10/26/2018 7:13 PM

## 2018-10-26 NOTE — Transfer of Care (Signed)
Immediate Anesthesia Transfer of Care Note  Patient: [name]  Procedure(s) Performed: CORONARY ARTERY BYPASS GRAFTING (CABG) x3 using the LIMA to LAD and right greater saphenous vein via endoscopic harvest to the Diag 1 and OM 2. (N/A Chest)  Patient Location: SICU  Anesthesia Type:General  Level of Consciousness: sedated, unresponsive and Patient remains intubated per anesthesia plan  Airway & Oxygen Therapy: Patient remains intubated per anesthesia plan and Patient placed on Ventilator (see vital sign flow sheet for setting)  Post-op Assessment: Report given to RN and Post -op Vital signs reviewed and stable  Post vital signs: Reviewed and stable  Last Vitals:  Vitals Value Taken Time  BP 91/68 10/26/18 1306  Temp 35.8 C 10/26/18 1316  Pulse 73 10/26/18 1316  Resp 10 10/26/18 1316  SpO2 100 % 10/26/18 1316  Vitals shown include unvalidated device data.  Last Pain:  Vitals:   10/26/18 0429  TempSrc: Oral  PainSc:          Complications: No apparent anesthesia complications

## 2018-10-26 NOTE — Progress Notes (Signed)
  Echocardiogram Echocardiogram Transesophageal has been performed.  [name] 10/26/2018, 9:56 AM

## 2018-10-26 NOTE — Op Note (Signed)
[address]       [place] [address]             [phone]                                          10/26/2018 Patient:  [name] Pre-Op Dx: Lm CAD, DM, HTN, stable angina   Post-op Dx:  same Procedure: CABG X 3.  LIMA LAD, RSVG D1, OM2   Endoscopic greater saphenous vein harvest on the right Intra-operative Transesophageal Echocardiogram  Surgeon and Role:      * Treylon Henard, Lucile Crater, MD - Primary    * M. Roddenberry, PA-C - assisting  Anesthesia  general EBL:  500 ml Blood Administration: none Xclamp Time:  38 min Pump Time:  68 min  Drains: 19 F blake drain:  R, L, mediastinal  Wires: none Counts: correct   Indications: 63 yo male admitted with progressive chest pain.  He underwent a LHC which showed severe LM disease.   He states that he chest pain has progressed over the last several weeks.  He currently is unable to do small tasks without symptoms  Findings: Good conduit, good targets.  Good flows.    Operative Technique: All invasive lines were placed in pre-op holding.  After the risks, benefits and alternatives were thoroughly discussed, the patient was brought to the operative theatre.  Anesthesia was induced, and the patient was prepped and draped in normal sterile fashion.  An appropriate surgical pause was performed, and pre-operative antibiotics were dosed accordingly.  We began with simultaneous incisions were made along the right leg for harvesting of the greater saphenous vein and the chest for the sternotomy.  In regards to the sternotomy, this was carried down with bovie cautery, and the sternum was divided with a reciprocating saw.  Meticulous hemostasis was obtained.  The left internal thoracic artery was exposed and harvested in in pedicled fashion.  The patient was systemically heparinized, and the artery was divided distally, and placed in a papaverine sponge.    The sternal elevator was removed, and a retractor was  placed.  The pericardium was divided in the midline and fashioned into a cradle with pericardial stitches.   After we confirmed an appropriate ACT, the ascending aorta was cannulated in standard fashion.  The right atrial appendage was used for venous cannulation site.  Cardiopulmonary bypass was initiated, and the heart retractor was placed. The cross clamp was applied, and a dose of anterograde cardioplegia was given with good arrest of the heart.  Next we exposed the lateral wall, and found a good target on the OM2.  An end to side anastomosis with the vein graft was then created.  Next, we exposed the anterior wall of the heart and identified a good target on the first diagonal branch.   An arteriotomy was created.  The vein was anastomosed in an end to side fashion.  Finally, we exposed a good target on the LAD, and fashioned an end to side anastomosis between it and the LITA.  We began to re-warm, and a re-animation dose of cardioplegia was given.  The heart was de-aired, and the cross clamp was removed.  Meticulous hemostasis was obtained.    A partial occludding clamp was then placed on the ascending aorta, and we created an end to side anastomosis between it  and the proximal vein grafts.  The proximal sites were marked with rings.  Hemostasis was obtained, and we separated from cardiopulmonary bypass without event.the heparin was reversed with protamine.  Chest tubes and wires were placed, and the sternum was re-approximated with with sternal wires.  The soft tissue and skin were re-approximated wth absorbable suture.    The patient tolerated the procedure without any immediate complications, and was transferred to the ICU in guarded condition.  Cayson Kalb Bary Leriche

## 2018-10-26 NOTE — Progress Notes (Addendum)
   Stable overnight.  No chest pain.  Creatinine 1.39 despite contrast.  Has been seen by Dr. Kipp Brood and plan is for coronary bypass grafting this morning.  He will receive two-vessel grafting with LIMA to LAD and probable saphenous vein graft to circumflex.  Right coronary is nondominant.  Echo yesterday demonstrated normal LV function with sclerotic/mild aortic stenosis.  EF 55% without regional wall motion abnormality.  High intensity statin therapy has been added.

## 2018-10-26 NOTE — Discharge Summary (Signed)
Physician Discharge Summary  Patient ID: [name] MRN: [id number] DOB/AGE: 64-Jan-1957 65 y.o.  Admit date: 10/24/2018 Discharge date: 10/31/2018  Admission Diagnoses: Unstable angina pectoris Coronary artery disease Left main coronary artery stenosis Type 2 diabetes mellitus Chronic stage 3 Kidney disesae Dyslipidemia Hypertension  Discharge Diagnoses:  Unstable angina pectoris Coronary artery disease Left main coronary artery stenosis Type 2 diabetes mellitus Chronic stage 3 Kidney disesae Dyslipidemia Hypertension S/P CABG x 3  Discharged Condition: good  History of Present Illness:     63 yo male admitted with progressive chest pain.  He underwent a LHC which showed severe LM disease.  He states that he chest pain has progressed over the last several weeks.  He currently is unable to do small tasks without symptoms. He was referred for consideration of urgent CABG.  After evaluation by Dr. Kipp Brood, coronary revascularization was recommended and Mr. Kornegay  Decided to proceed with surgery.   Hospital Course:  Mr. Henneman remained stable and angina was controlled on heparin and nitroglycerine drips after the left heart cath.  He was taken to the OR on 10/26/18 where CABG x 3 was performed without complication. Following the procedure he separated from  Cardiopulmonary bypass without difficulty and was transferred to the cardiovascular ICU in stable condition. He remained hemodynamically stable. He was extubated routinely and was mobilized on the first post-op day. Diet and activity were advanced and well tolerated. The chest tubes were remove when drainage subsided. He was transferred to 4E progressive care. The patient continued to progress.  He remains in NSR.  His pacing wires were removed without difficulty.  His creatinine has returned to baseline at 1.46.  He will be resumed on his home Metformin at discharge.  His   He is ambulating independently.  His incisions are  healing without evidence of infection.  He is medically stable for discharge home today.  Significant Diagnostic Studies:  CORONARY ANGIOGRAPHY (CATH LAB)  Conclusion  1. Severe complex distal left main disease extending into the LAD and LCx ostia 2. Severe proximal LAD stenosis 3. Patent, non-dominant RCA without high grade stenosis  Recommend: urgent cardiac surgical consultation   Coronary Findings  Diagnostic Dominance: Left Left Main  Mid LM to Dist LM lesion 95% stenosed  Mid LM to Dist LM lesion is 95% stenosed. Severe distal left main lesion extending into the left circumflex originand the LAD origin  Left Anterior Descending  Prox LAD lesion 70% stenosed  Prox LAD lesion is 70% stenosed.  Intervention  No interventions have been documented. Coronary Diagrams  Diagnostic Dominance: Left           ECHOCARDIOGRAM REPORT       Patient Name:   [name] Date of Exam: 10/25/2018 Medical Rec #:  [id number]     Height:       71.0 in Accession #:    [id number]    Weight:       220.8 lb Date of Birth:  [birth year]    BSA:          2.20 m Patient Age:    43 years      BP:           126/81 mmHg Patient Gender: M             HR:           58 bpm. Exam Location:  Inpatient  Procedure: 2D Echo  Indications:    chest pain 786.50   History:  Patient has prior history of Echocardiogram examinations, most                 recent 04/23/2014.   Sonographer:    Jannett Celestine RDCS (AE) Referring Phys: [id number] Cleaster Corin PATEL    Sonographer Comments: Image acquisition challenging due to respiratory motion. IMPRESSIONS    1. Left ventricular ejection fraction, by visual estimation, is 55 to 60%. The left ventricle has normal function. Normal left ventricular size. There is no left ventricular hypertrophy.  2. Global right ventricle has normal systolic function.The right ventricular size is mildly enlarged. No increase in right ventricular wall  thickness.  3. Left atrial size was mildly dilated.  4. Right atrial size was normal.  5. Mild to moderate mitral annular calcification.  6. The mitral valve is normal in structure. Trace mitral valve regurgitation. No evidence of mitral stenosis.  7. The tricuspid valve is normal in structure. Tricuspid valve regurgitation is trivial.  8. The aortic valve is tricuspid Aortic valve regurgitation was not visualized by color flow Doppler. Mild aortic valve sclerosis without stenosis.  9. The pulmonic valve was normal in structure. Pulmonic valve regurgitation is not visualized by color flow Doppler. 10. TR signal is inadequate for assessing pulmonary artery systolic pressure.  FINDINGS  Left Ventricle: Left ventricular ejection fraction, by visual estimation, is 55 to 60%. The left ventricle has normal function. There is no left ventricular hypertrophy. Normal left ventricular size. Spectral Doppler shows Left ventricular diastolic  parameters were normal pattern of LV diastolic filling. Normal left ventricular filling pressures.  Right Ventricle: The right ventricular size is mildly enlarged. No increase in right ventricular wall thickness. Global RV systolic function is has normal systolic function.  Left Atrium: Left atrial size was mildly dilated.  Right Atrium: Right atrial size was normal in size  Pericardium: There is no evidence of pericardial effusion.  Mitral Valve: The mitral valve is normal in structure. Mild to moderate mitral annular calcification. No evidence of mitral valve stenosis by observation. Trace mitral valve regurgitation.  Tricuspid Valve: The tricuspid valve is normal in structure. Tricuspid valve regurgitation is trivial by color flow Doppler.  Aortic Valve: The aortic valve is tricuspid. Aortic valve regurgitation was not visualized by color flow Doppler. Mild aortic valve sclerosis is present, with no evidence of aortic valve stenosis.  Pulmonic Valve:  The pulmonic valve was normal in structure. Pulmonic valve regurgitation is not visualized by color flow Doppler.  Aorta: The aortic root, ascending aorta and aortic arch are all structurally normal, with no evidence of dilitation or obstruction.  Venous: The inferior vena cava was not well visualized.  IAS/Shunts: No atrial level shunt detected by color flow Doppler. No ventricular septal defect is seen or detected. There is no evidence of an atrial septal defect.     LEFT VENTRICLE PLAX 2D LVIDd:         5.20 cm  Diastology LVIDs:         3.20 cm  LV e' lateral:   14.70 cm/s LV PW:         1.00 cm  LV E/e' lateral: 4.5 LV IVS:        0.70 cm  LV e' medial:    9.14 cm/s LVOT diam:     2.20 cm  LV E/e' medial:  7.2 LV SV:         89 ml LV SV Index:   39.08 LVOT Area:     3.80 cm  RIGHT VENTRICLE RV S prime:     12.70 cm/s TAPSE (M-mode): 2.5 cm  LEFT ATRIUM              Index       RIGHT ATRIUM           Index LA diam:        4.40 cm  2.00 cm/m  RA Area:     21.50 cm LA Vol (A2C):   100.0 ml 45.47 ml/m RA Volume:   60.00 ml  27.28 ml/m LA Vol (A4C):   41.0 ml  18.64 ml/m LA Biplane Vol: 64.2 ml  29.19 ml/m  AORTIC VALVE LVOT Vmax:   110.00 cm/s LVOT Vmean:  75.000 cm/s LVOT VTI:    0.250 m   AORTA Ao Root diam: 3.40 cm  MITRAL VALVE MV Area (PHT): 1.86 cm             SHUNTS MV PHT:        118.32 msec          Systemic VTI:  0.25 m MV Decel Time: 408 msec             Systemic Diam: 2.20 cm MV E velocity: 65.60 cm/s 103 cm/s MV A velocity: 71.60 cm/s 70.3 cm/s MV E/A ratio:  0.92       1.5    Fransico Him MD Electronically signed by Fransico Him MD Signature Date/Time: 10/25/2018/9:20:56 AM     Treatments:   OPERATIVE NOTE 10/26/2018 Patient:  [name] Pre-Op Dx: Lm CAD, DM, HTN, stable angina   Post-op Dx:  same Procedure: CABG X 3.  LIMA LAD, RSVG D1, OM2   Endoscopic greater saphenous vein harvest on the right Intra-operative  Transesophageal Echocardiogram  Surgeon and Role:      * Lightfoot, Lucile Crater, MD - Primary    * M. Roddenberry, PA-C - assisting  Anesthesia  general EBL:  500 ml Blood Administration: none Xclamp Time:  38 min Pump Time:  68 min  Drains: 19 F blake drain:  R, L, mediastinal  Wires: none Counts: correct   Indications: 63 yo male admitted with progressive chest pain. He underwent a LHC which showed severe LM disease.   He states that he chest pain has progressed over the last several weeks. He currently is unable to do small tasks without symptoms  Findings: Good conduit, good targets.  Good flows.    Discharge Exam: Blood pressure 127/80, pulse 63, temperature 98.7 F (37.1 C), temperature source Oral, resp. rate (!) 23, height '5\' 11"'  (1.803 m), weight 97.3 kg, SpO2 100 %.  General appearance: alert, cooperative and no distress Heart: regular rate and rhythm Lungs: clear to auscultation bilaterally Abdomen: soft, non-tender; bowel sounds normal; no masses,  no organomegaly Extremities: edema minimal appreciated Wound: clean and dry  Discharge disposition: 01-Home or Self Care   Allergies as of 10/31/2018      Reactions   Prednisone Other (See Comments)   Caused blood sugar to increase, pt will not take      Medication List    STOP taking these medications   benazepril 20 MG tablet Commonly known as: LOTENSIN     TAKE these medications   acetaminophen 500 MG tablet Commonly known as: TYLENOL Take 2 tablets (1,000 mg total) by mouth every 6 (six) hours as needed. What changed:   when to take this  reasons to take this   allopurinol 300 MG tablet Commonly known as: ZYLOPRIM TAKE  1/2 TABLET BY MOUTH DAILY   amphetamine-dextroamphetamine 10 MG tablet Commonly known as: Adderall Take 1 tablet (10 mg total) by mouth daily with breakfast. What changed: Another medication with the same name was removed. Continue taking this medication, and follow  the directions you see here.   aspirin 81 MG tablet Take 81 mg by mouth daily.   atorvastatin 80 MG tablet Commonly known as: LIPITOR Take 1 tablet (80 mg total) by mouth at bedtime. What changed:   medication strength  See the new instructions.   blood glucose meter kit and supplies Dispense based on patient and insurance preference. Use to check fasting blood glucose in the morning and to check glucose 2 hours after largest meal of the day. (FOR ICD-10 E10.9, E11.9).   FLUoxetine 40 MG capsule Commonly known as: PROZAC TAKE 1 CAPSULE BY MOUTH DAILY   gabapentin 800 MG tablet Commonly known as: NEURONTIN Take 1 tablet (800 mg total) by mouth 3 (three) times daily.   glucose blood test strip Use as instructed   ketoconazole 2 % cream Commonly known as: NIZORAL Apply 1 application topically 2 (two) times daily. To affected areas.   Lancets Ultra Fine Misc Use to check fasting blood glucose in the morning and to check glucose 2 hours after largest meal of the day   metFORMIN 500 MG 24 hr tablet Commonly known as: GLUCOPHAGE-XR TAKE 1 TABLET BY MOUTH DAILY WITH BREAKFAST   metoprolol tartrate 25 MG tablet Commonly known as: LOPRESSOR Take 1 tablet (25 mg total) by mouth 3 (three) times daily.   multivitamins with iron Tabs tablet Take 1 tablet by mouth daily.   Myrbetriq 25 MG Tb24 tablet Generic drug: mirabegron ER Take 25 mg by mouth daily.   pantoprazole 40 MG tablet Commonly known as: PROTONIX TAKE 1 TABLET BY MOUTH DAILY   polyethylene glycol powder 17 GM/SCOOP powder Commonly known as: GLYCOLAX/MIRALAX USE AS DIRECTED What changed:   how much to take  how to take this  when to take this  additional instructions   pyridoxine 100 MG tablet Commonly known as: B-6 Take 100 mg by mouth daily.   traMADol 50 MG tablet Commonly known as: ULTRAM Take 1-2 tablets (50-100 mg total) by mouth every 4 (four) hours as needed for moderate pain. What  changed:   how much to take  when to take this  reasons to take this   traZODone 50 MG tablet Commonly known as: DESYREL TAKE 1/2 TO 1 TABLET BY MOUTH AT [facility] NEEDED FOR SLEEP What changed: See the new instructions.   vitamin C 1000 MG tablet Take 1,000 mg by mouth daily.   Vitamin D (Ergocalciferol) 1.25 MG (50000 UT) Caps capsule Commonly known as: DRISDOL TAKE 1 CAPSULE BY MOUTH EVERY 7 DAYS What changed: See the new instructions.      Follow-up Information    Burtis Junes, NP Follow up on 11/15/2018.   Specialties: Nurse Practitioner, Interventional Cardiology, Cardiology, Radiology Why: 11:00 AM Contact information: [place]. [address] [place] [address] [phone]        Lajuana Matte, MD Follow up on 11/11/2018.   Specialty: Cardiothoracic Surgery Why: Appointment is at 12:45 Contact information: [place] [address] [phone]          The patient has been discharged on:   1.Beta Blocker:  Yes [ X  ]  No   [   ]                              If No, reason:  2.Ace Inhibitor/ARB: Yes [   ]                                     No  [  X  ]                                     If No, reason: elevated creatinine  3.Statin:   Yes [  X ]                  No  [   ]                  If No, reason:  4.Ecasa:  Yes  [ X  ]                  No   [   ]                  If No, reason:    Signed:  Original note by Enid Cutter PA-C   Update by: Ellwood Handler PA-C 10/31/2018, 7:46 AM

## 2018-10-26 NOTE — Brief Op Note (Signed)
10/24/2018 - 10/26/2018  11:53 AM  PATIENT:  [name]  63 y.o. male  PRE-OPERATIVE DIAGNOSIS:  CORONARY ARTERY DISEASE  POST-OPERATIVE DIAGNOSIS:  CORONARY ARTERY DISEASE  PROCEDURE:  Procedure(s): CORONARY ARTERY BYPASS GRAFTING (CABG) x3 using the LIMA to LAD and right greater saphenous vein endoscopic harvested to Diag 1 and OM 2. (N/A)  SURGEON:   Lightfoot, Lucile Crater, MD   PHYSICIAN ASSISTANT: Shivam Mestas  ANESTHESIA:   general  EBL:  Per anesthesia and perfusion records   BLOOD ADMINISTERED:none  DRAINS: Right and left pleural tubes and mediastinal drain.    LOCAL MEDICATIONS USED:  NONE  SPECIMEN:  No Specimen  DISPOSITION OF SPECIMEN:  N/A  COUNTS:  YES  DICTATION: .Dragon Dictation  PLAN OF CARE: Admit to inpatient   PATIENT DISPOSITION:  ICU - intubated and hemodynamically stable.   Delay start of Pharmacological VTE agent (>24hrs) due to surgical blood loss or risk of bleeding: yes

## 2018-10-27 ENCOUNTER — Inpatient Hospital Stay (HOSPITAL_COMMUNITY): Payer: PPO

## 2018-10-27 ENCOUNTER — Encounter (HOSPITAL_COMMUNITY): Payer: Self-pay | Admitting: Thoracic Surgery (Cardiothoracic Vascular Surgery)

## 2018-10-27 DIAGNOSIS — I25810 Atherosclerosis of coronary artery bypass graft(s) without angina pectoris: Secondary | ICD-10-CM

## 2018-10-27 LAB — CBC
HCT: 30 % — ABNORMAL LOW (ref 39.0–52.0)
HCT: 29.6 % — ABNORMAL LOW (ref 39.0–52.0)
Hemoglobin: 9.6 g/dL — ABNORMAL LOW (ref 13.0–17.0)
Hemoglobin: 9.7 g/dL — ABNORMAL LOW (ref 13.0–17.0)
MCH: 27.1 pg (ref 26.0–34.0)
MCH: 27.4 pg (ref 26.0–34.0)
MCHC: 32 g/dL (ref 30.0–36.0)
MCHC: 32.8 g/dL (ref 30.0–36.0)
MCV: 84.7 fL (ref 80.0–100.0)
MCV: 83.6 fL (ref 80.0–100.0)
Platelets: 166 10*3/uL (ref 150–400)
Platelets: 149 10*3/uL — ABNORMAL LOW (ref 150–400)
RBC: 3.54 MIL/uL — ABNORMAL LOW (ref 4.22–5.81)
RBC: 3.54 MIL/uL — ABNORMAL LOW (ref 4.22–5.81)
RDW: 13.5 % (ref 11.5–15.5)
RDW: 13.5 % (ref 11.5–15.5)
WBC: 9 10*3/uL (ref 4.0–10.5)
WBC: 9.8 10*3/uL (ref 4.0–10.5)
nRBC: 0 % (ref 0.0–0.2)
nRBC: 0 % (ref 0.0–0.2)

## 2018-10-27 LAB — BASIC METABOLIC PANEL
Anion gap: 7 (ref 5–15)
Anion gap: 8 (ref 5–15)
BUN: 19 mg/dL (ref 8–23)
BUN: 20 mg/dL (ref 8–23)
CO2: 23 mmol/L (ref 22–32)
CO2: 24 mmol/L (ref 22–32)
Calcium: 8.6 mg/dL — ABNORMAL LOW (ref 8.9–10.3)
Calcium: 8.6 mg/dL — ABNORMAL LOW (ref 8.9–10.3)
Chloride: 108 mmol/L (ref 98–111)
Chloride: 104 mmol/L (ref 98–111)
Creatinine, Ser: 1.38 mg/dL — ABNORMAL HIGH (ref 0.61–1.24)
Creatinine, Ser: 1.49 mg/dL — ABNORMAL HIGH (ref 0.61–1.24)
GFR calc Af Amer: >60 mL/min (ref >60)
GFR calc Af Amer: 57 mL/min — ABNORMAL LOW (ref >60)
GFR calc non Af Amer: 54 mL/min — ABNORMAL LOW (ref >60)
GFR calc non Af Amer: 49 mL/min — ABNORMAL LOW (ref >60)
Glucose, Bld: 105 mg/dL — ABNORMAL HIGH (ref 70–99)
Glucose, Bld: 157 mg/dL — ABNORMAL HIGH (ref 70–99)
Potassium: 4.6 mmol/L (ref 3.5–5.1)
Potassium: 4.5 mmol/L (ref 3.5–5.1)
Sodium: 138 mmol/L (ref 135–145)
Sodium: 136 mmol/L (ref 135–145)

## 2018-10-27 LAB — GLUCOSE, CAPILLARY
Glucose-Capillary: 105 mg/dL — ABNORMAL HIGH (ref 70–99)
Glucose-Capillary: 106 mg/dL — ABNORMAL HIGH (ref 70–99)
Glucose-Capillary: 108 mg/dL — ABNORMAL HIGH (ref 70–99)
Glucose-Capillary: 110 mg/dL — ABNORMAL HIGH (ref 70–99)
Glucose-Capillary: 111 mg/dL — ABNORMAL HIGH (ref 70–99)
Glucose-Capillary: 117 mg/dL — ABNORMAL HIGH (ref 70–99)
Glucose-Capillary: 120 mg/dL — ABNORMAL HIGH (ref 70–99)
Glucose-Capillary: 129 mg/dL — ABNORMAL HIGH (ref 70–99)
Glucose-Capillary: 135 mg/dL — ABNORMAL HIGH (ref 70–99)
Glucose-Capillary: 148 mg/dL — ABNORMAL HIGH (ref 70–99)
Glucose-Capillary: 158 mg/dL — ABNORMAL HIGH (ref 70–99)

## 2018-10-27 LAB — MAGNESIUM
Magnesium: 2.4 mg/dL (ref 1.7–2.4)
Magnesium: 2.2 mg/dL (ref 1.7–2.4)

## 2018-10-27 MED ORDER — SODIUM CHLORIDE 0.9% FLUSH: 3.0000 mL | INTRAVENOUS | Status: DC | PRN

## 2018-10-27 MED ORDER — INSULIN ASPART 100 UNIT/ML ~~LOC~~ SOLN
0.0000 [IU] | SUBCUTANEOUS | Status: DC
  Administered 2018-10-27 – 2018-10-28 (×5): 2 [IU] via SUBCUTANEOUS
  Administered 2018-10-28: 8 [IU] via SUBCUTANEOUS
  Administered 2018-10-28 (×3): 2 [IU] via SUBCUTANEOUS
  Administered 2018-10-29: 4 [IU] via SUBCUTANEOUS
  Administered 2018-10-29 – 2018-10-30 (×3): 2 [IU] via SUBCUTANEOUS
  Administered 2018-10-30: 17:00:00 4 [IU] via SUBCUTANEOUS
  Administered 2018-10-30: 8 [IU] via SUBCUTANEOUS

## 2018-10-27 MED ORDER — SODIUM CHLORIDE 0.9% FLUSH: 3.0000 mL | Freq: Two times a day (BID) | INTRAVENOUS | Status: DC

## 2018-10-27 MED ORDER — ~~LOC~~ CARDIAC SURGERY, PATIENT & FAMILY EDUCATION: Freq: Once | Status: AC

## 2018-10-27 MED ORDER — SODIUM CHLORIDE 0.9 % IV SOLN: 250.0000 mL | INTRAVENOUS | Status: DC | PRN

## 2018-10-27 MED FILL — Potassium Chloride Inj 2 mEq/ML: INTRAVENOUS | Qty: 40 | Status: AC

## 2018-10-27 MED FILL — Heparin Sodium (Porcine) Inj 1000 Unit/ML: INTRAMUSCULAR | Qty: 30 | Status: AC

## 2018-10-27 MED FILL — Lidocaine HCl Local Preservative Free (PF) Inj 2%: INTRAMUSCULAR | Qty: 15 | Status: AC

## 2018-10-27 NOTE — Progress Notes (Signed)
      [address]       [place] [address]             [phone]                 1 Day Post-Op Procedure(s) (LRB): CORONARY ARTERY BYPASS GRAFTING (CABG) x3 using the LIMA to LAD and right greater saphenous vein via endoscopic harvest to the Diag 1 and OM 2. (N/A)   Events: No events _______________________________________________________________ Vitals: BP 105/67   Pulse 77   Temp 98.1 F (36.7 C)   Resp (!) 21   Ht 5\' 11"  (1.803 m)   Wt 100.9 kg   SpO2 95%   BMI 31.02 kg/m   - Neuro: alert NAD  - Cardiovascular: sinus  Drips: none.  PAP: (18-33)/(6-17) 30/13 CO:  [4.2 L/min-5.2 L/min] 4.5 L/min CI:  [2 L/min/m2-2.4 L/min/m2] 2 L/min/m2  - Pulm: EWOB  ABG    Component Value Date/Time   PHART 7.328 (L) 10/26/2018 1744   PCO2ART 45.8 10/26/2018 1744   PO2ART 132.0 (H) 10/26/2018 1744   HCO3 24.1 10/26/2018 1744   TCO2 25 10/26/2018 1744   ACIDBASEDEF 2.0 10/26/2018 1744   O2SAT 99.0 10/26/2018 1744    - Abd: S/NT/ND - Extremity: trace edema  .Intake/Output      10/21 0701 - 10/22 0700 10/22 0701 - 10/23 0700   P.O. 240    I.V. (mL/kg) 2721.9 (27) 41.9 (0.4)   Blood 197    IV Piggyback 744.5    Total Intake(mL/kg) 3903.4 (38.7) 41.9 (0.4)   Urine (mL/kg/hr) 3855 (1.6) 100 (0.2)   Emesis/NG output 0    Stool     Blood 400    Chest Tube 344    Total Output 4599 100   Net -695.6 -58.1           _______________________________________________________________ Labs: CBC Latest Ref Rng & Units 10/27/2018 10/26/2018 10/26/2018  WBC 4.0 - 10.5 K/uL 9.0 9.8 -  Hemoglobin 13.0 - 17.0 g/dL 9.6(L) 10.0(L) 10.2(L)  Hematocrit 39.0 - 52.0 % 30.0(L) 29.9(L) 30.0(L)  Platelets 150 - 400 K/uL 166 148(L) -   CMP Latest Ref Rng & Units 10/27/2018 10/26/2018 10/26/2018  Glucose 70 - 99 mg/dL 105(H) 121(H) -  BUN 8 - 23 mg/dL 19 19 -  Creatinine 0.61 - 1.24 mg/dL 1.38(H) 1.36(H) -  Sodium 135 - 145 mmol/L 138 137 141  Potassium 3.5 - 5.1  mmol/L 4.6 4.4 4.8  Chloride 98 - 111 mmol/L 108 107 -  CO2 22 - 32 mmol/L 23 22 -  Calcium 8.9 - 10.3 mg/dL 8.6(L) 8.6(L) -  Total Protein 6.5 - 8.1 g/dL - - -  Total Bilirubin 0.3 - 1.2 mg/dL - - -  Alkaline Phos 38 - 126 U/L - - -  AST 15 - 41 U/L - - -  ALT 0 - 44 U/L - - -    CXR: clear  _______________________________________________________________  Assessment and Plan: POD 1 s/p CABG doing well  Neuro: pain controlled CV: on A/S/BB. Will remove invasive lines   Pulm: pulm toilet.  Will keep CT for now Renal: good uop.  Will diurese tomorrow GI: advancing diet Heme: stable ID: [email or web address] Endo: SSI  Dispo: floor  Melodie Bouillon, MD 10/27/2018 11:00 AM

## 2018-10-27 NOTE — Progress Notes (Signed)
Patient ID: [name], male   DOB: April 13, 1955, 63 y.o.   MRN: [id number] [facility] Evening Rounds:  Hemodynamically stable   Good urine output  CT output low.  BMET    Component Value Date/Time   NA 136 10/27/2018 1624   NA 137 08/30/2018 0916   K 4.5 10/27/2018 1624   CL 104 10/27/2018 1624   CO2 24 10/27/2018 1624   GLUCOSE 157 (H) 10/27/2018 1624   BUN 20 10/27/2018 1624   BUN 26 08/30/2018 0916   CREATININE 1.49 (H) 10/27/2018 1624   CREATININE 1.66 (H) 04/20/2014 1616   CALCIUM 8.6 (L) 10/27/2018 1624   GFRNONAA 49 (L) 10/27/2018 1624   GFRNONAA 44 (L) 04/20/2014 1616   GFRAA 57 (L) 10/27/2018 1624   GFRAA 51 (L) 04/20/2014 1616   CBC    Component Value Date/Time   WBC 9.8 10/27/2018 1624   RBC 3.54 (L) 10/27/2018 1624   HGB 9.7 (L) 10/27/2018 1624   HGB 12.7 (L) 07/23/2017 0829   HCT 29.6 (L) 10/27/2018 1624   HCT 40.1 07/23/2017 0829   PLT 149 (L) 10/27/2018 1624   PLT 214 07/23/2017 0829   MCV 83.6 10/27/2018 1624   MCV 84 07/23/2017 0829   MCH 27.4 10/27/2018 1624   MCHC 32.8 10/27/2018 1624   RDW 13.5 10/27/2018 1624   RDW 13.0 07/23/2017 0829   LYMPHSABS 1.1 07/23/2017 0829   MONOABS 0.5 04/17/2016 0835   EOSABS 0.1 07/23/2017 0829   BASOSABS 0.0 07/23/2017 0829

## 2018-10-27 NOTE — Progress Notes (Addendum)
The patient has been seen in conjunction with Nathanial Rancher, MD. All aspects of care have been considered and discussed. The patient has been personally interviewed, examined, and all clinical data has been reviewed.   ECG with diffuse ST elevation c/w pericarditis.  Progress Note  Patient Name: [name] Date of Encounter: 10/27/2018  Primary Cardiologist: No primary care provider on file.   Subjective   He is feeling much better this morning without chest pain or shortness of breath.  Inpatient Medications    Scheduled Meds:  acetaminophen  1,000 mg Oral Q6H   Or   acetaminophen (TYLENOL) oral liquid 160 mg/5 mL  1,000 mg Per Tube Q6H   aspirin EC  325 mg Oral Daily   Or   aspirin  324 mg Per Tube Daily   atorvastatin  80 mg Oral QHS   bisacodyl  10 mg Oral Daily   Or   bisacodyl  10 mg Rectal Daily   chlorhexidine gluconate (MEDLINE KIT)  15 mL Mouth Rinse BID   Chlorhexidine Gluconate Cloth  6 each Topical Daily   Chlorhexidine Gluconate Cloth  6 each Topical Daily   docusate sodium  200 mg Oral Daily   FLUoxetine  40 mg Oral Daily   insulin aspart  0-24 Units Subcutaneous Q4H   insulin regular  0-10 Units Intravenous TID WC   metoprolol tartrate  12.5 mg Oral BID   Or   metoprolol tartrate  12.5 mg Per Tube BID   [START ON 10/28/2018] pantoprazole  40 mg Oral Daily   sodium chloride flush  10-40 mL Intracatheter Q12H   sodium chloride flush  3 mL Intravenous Q12H   Continuous Infusions:  sodium chloride 20 mL/hr at 10/27/18 0700   sodium chloride     sodium chloride 10 mL/hr at 10/26/18 1340   albumin human     cefUROXime (ZINACEF)  IV Stopped (10/26/18 2157)   dexmedetomidine (PRECEDEX) IV infusion Stopped (10/26/18 1531)   famotidine (PEPCID) IV Stopped (10/26/18 1330)   insulin 1.9 mL/hr at 10/27/18 0700   lactated ringers     lactated ringers Stopped (10/26/18 1300)   lactated ringers 20 mL/hr at 10/27/18 0700    niCARDipine Stopped (10/26/18 1300)   norepinephrine (LEVOPHED) Adult infusion     phenylephrine (NEO-SYNEPHRINE) Adult infusion Stopped (10/26/18 1415)   PRN Meds: sodium chloride, albumin human, lactated ringers, metoprolol tartrate, midazolam, morphine injection, ondansetron (ZOFRAN) IV, oxyCODONE, sodium chloride flush, sodium chloride flush, traMADol   Vital Signs    Vitals:   10/27/18 0530 10/27/18 0630 10/27/18 0645 10/27/18 0700  BP:    105/67  Pulse: 74 75 76 77  Resp: 20 (!) 26 (!) 22 (!) 23  Temp: 98.6 F (37 C) 98.8 F (37.1 C) 98.8 F (37.1 C) 98.6 F (37 C)  TempSrc:      SpO2: 96% 95% 95% 96%  Weight:  100.9 kg    Height:        Intake/Output Summary (Last 24 hours) at 10/27/2018 0739 Last data filed at 10/27/2018 0700 Gross per 24 hour  Intake 3903.4 ml  Output 4599 ml  Net -695.6 ml   Filed Weights   10/25/18 0408 10/26/18 0500 10/27/18 0630  Weight: 100.2 kg 100.1 kg 100.9 kg    Telemetry    No evidence of V. tach, V. fib or dysrhythmia- Personally Reviewed  ECG    Diffuse ST elevation - Personally Reviewed  Physical Exam   GEN: No acute distress.  Cardiac: RRR, no murmurs, rubs, or gallops.  Respiratory: Clear to auscultation bilaterally. MS: No edema; No deformity. Neuro:  Nonfocal  Psych: Normal affect   Labs    Chemistry Recent Labs  Lab 10/24/18 1638  10/26/18 0449  10/26/18 1215  10/26/18 1744 10/26/18 2209 10/27/18 0409  NA  --    < > 138   < > 138   < > 141 137 138  K  --    < > 4.1   < > 5.1   < > 4.8 4.4 4.6  CL  --    < > 107   < > 104  --   --  107 108  CO2  --    < > 22  --   --   --   --  22 23  GLUCOSE  --    < > 128*   < > 131*  --   --  121* 105*  BUN  --    < > 23   < > 22  --   --  19 19  CREATININE  --    < > 1.39*   < > 1.10  --   --  1.36* 1.38*  CALCIUM  --    < > 9.0  --   --   --   --  8.6* 8.6*  PROT 8.2*  --   --   --   --   --   --   --   --   ALBUMIN 4.4  --   --   --   --   --   --   --   --     AST 41  --   --   --   --   --   --   --   --   ALT 28  --   --   --   --   --   --   --   --   ALKPHOS 73  --   --   --   --   --   --   --   --   BILITOT 0.8  --   --   --   --   --   --   --   --   GFRNONAA  --    < > 54*  --   --   --   --  55* 54*  GFRAA  --    < > >60  --   --   --   --  >60 >60  ANIONGAP  --    < > 9  --   --   --   --  8 7   < > = values in this interval not displayed.     Hematology Recent Labs  Lab 10/26/18 1318  10/26/18 1744 10/26/18 2209 10/27/18 0409  WBC 10.7*  --   --  9.8 9.0  RBC 3.46*  --   --  3.55* 3.54*  HGB 9.4*   < > 10.2* 10.0* 9.6*  HCT 29.8*   < > 30.0* 29.9* 30.0*  MCV 86.1  --   --  84.2 84.7  MCH 27.2  --   --  28.2 27.1  MCHC 31.5  --   --  33.4 32.0  RDW 13.5  --   --  13.4 13.5  PLT 151  --   --  148* 166   < > =  values in this interval not displayed.    Cardiac EnzymesNo results for input(s): TROPONINI in the last 168 hours. No results for input(s): TROPIPOC in the last 168 hours.   BNPNo results for input(s): BNP, PROBNP in the last 168 hours.   DDimer  Recent Labs  Lab 10/24/18 1638  DDIMER 0.72*     Radiology    Dg Chest Port 1 View  Result Date: 10/27/2018 CLINICAL DATA:  Status post coronary bypass graft. EXAM: PORTABLE CHEST 1 VIEW COMPARISON:  October 26, 2018. FINDINGS: Stable cardiomediastinal silhouette. Status post coronary bypass graft. Endotracheal and nasogastric tubes have been removed. Right internal jugular Swan-Ganz catheter is noted with tip directed into right pulmonary artery. Mild bibasilar subsegmental atelectasis is noted. No pneumothorax is noted. Bony thorax is unremarkable. IMPRESSION: Endotracheal and nasogastric tubes have been removed. Mild bibasilar subsegmental atelectasis. Electronically Signed   By: Marijo Conception M.D.   On: 10/27/2018 07:08   Dg Chest Port 1 View  Result Date: 10/26/2018 CLINICAL DATA:  Postop from CABG. EXAM: PORTABLE CHEST 1 VIEW COMPARISON:  10/24/2018  FINDINGS: Endotracheal tube and nasogastric tube are seen in appropriate position. Swan-Ganz catheter tip overlies distal pulmonary artery trunk. Chest tubes are also seen in place bilaterally. No pneumothorax visualized. Mild subsegmental atelectasis is seen in both lung bases. Patient is status post CABG, and cervical spine fusion hardware is again noted. IMPRESSION: Postop chest. Mild bibasilar subsegmental atelectasis. No pneumothorax visualized. Electronically Signed   By: Marlaine Hind M.D.   On: 10/26/2018 13:43   Vas US Doppler Pre Cabg  Result Date: 10/26/2018 PREOPERATIVE VASCULAR EVALUATION  Indications:      Pre-surgical evaluation. Risk Factors:     Hypertension, hyperlipidemia, Diabetes, coronary artery                   disease. Comparison Study: No prior study. Performing Technologist: Antonieta Pert RDMS, RVT Supporting Technologist: Maudry Mayhew MHA, RDMS, RVT, RDCS  Examination Guidelines: A complete evaluation includes B-mode imaging, spectral Doppler, color Doppler, and power Doppler as needed of all accessible portions of each vessel. Bilateral testing is considered an integral part of a complete examination. Limited examinations for reoccurring indications may be performed as noted.  Right Carotid Findings: +----------+--------+--------+--------+-----------------------------+--------+             PSV cm/s EDV cm/s Stenosis Describe                      Comments  +----------+--------+--------+--------+-----------------------------+--------+  CCA Prox   78       17                                                        +----------+--------+--------+--------+-----------------------------+--------+  CCA Distal 67       24                                                        +----------+--------+--------+--------+-----------------------------+--------+  ICA Prox   75       30                hyperechoic, smooth and focal            +----------+--------+--------+--------+-----------------------------+--------+  ICA Distal 63       21                                                        +----------+--------+--------+--------+-----------------------------+--------+  ECA        47       9                                                         +----------+--------+--------+--------+-----------------------------+--------+ Portions of this table do not appear on this page. +----------+--------+-------+----------------+------------+             PSV cm/s EDV cms Describe         Arm Pressure  +----------+--------+-------+----------------+------------+  Subclavian 118              Multiphasic, WNL               +----------+--------+-------+----------------+------------+ +---------+--------+--+--------+--+---------+  Vertebral PSV cm/s 47 EDV cm/s 14 Antegrade  +---------+--------+--+--------+--+---------+ Left Carotid Findings: +----------+--------+--------+--------+------------------------+--------+             PSV cm/s EDV cm/s Stenosis Describe                 Comments  +----------+--------+--------+--------+------------------------+--------+  CCA Prox   96       23                                                   +----------+--------+--------+--------+------------------------+--------+  CCA Distal 61       24                diffuse and heterogenous           +----------+--------+--------+--------+------------------------+--------+  ICA Prox   66       30                                                   +----------+--------+--------+--------+------------------------+--------+  ICA Distal 91       31                                                   +----------+--------+--------+--------+------------------------+--------+  ECA        51       12                                                   +----------+--------+--------+--------+------------------------+--------+ +----------+--------+--------+----------------+------------+  Subclavian PSV  cm/s EDV cm/s Describe         Arm Pressure  +----------+--------+--------+----------------+------------+             152  Multiphasic, WNL 143           +----------+--------+--------+----------------+------------+ +---------+--------+--+--------+--+---------+  Vertebral PSV cm/s 36 EDV cm/s 14 Antegrade  +---------+--------+--+--------+--+---------+  ABI Findings: +--------+------------------+-----+---------+----------------------------------+  Right    Rt Pressure (mmHg) Index Waveform  Comment                             +--------+------------------+-----+---------+----------------------------------+  Brachial                          triphasic Unable to obtain pressure due to                                                 TR band                             +--------+------------------+-----+---------+----------------------------------+  PTA      169                1.18  triphasic                                     +--------+------------------+-----+---------+----------------------------------+  DP       161                1.13  triphasic                                     +--------+------------------+-----+---------+----------------------------------+ +--------+------------------+-----+---------+-------+  Left     Lt Pressure (mmHg) Index Waveform  Comment  +--------+------------------+-----+---------+-------+  Brachial 143                      triphasic          +--------+------------------+-----+---------+-------+  PTA      153                1.07  biphasic           +--------+------------------+-----+---------+-------+  DP       157                1.10  triphasic          +--------+------------------+-----+---------+-------+ +-------+---------------+----------------+  ABI/TBI Today's ABI/TBI Previous ABI/TBI  +-------+---------------+----------------+  Right   1.18                              +-------+---------------+----------------+  Left    1.10                               +-------+---------------+----------------+  Right Doppler Findings: +-----------+--------+-----+---------+----------------------------------------+  Site        Pressure Index Doppler   Comments                                  +-----------+--------+-----+---------+----------------------------------------+  Brachial                   triphasic Unable to obtain pressure due to TR band  +-----------+--------+-----+---------+----------------------------------------+  Radial                     triphasic                                           +-----------+--------+-----+---------+----------------------------------------+  Ulnar                      triphasic                                           +-----------+--------+-----+---------+----------------------------------------+  Palmar Arch                          Unable to evaluate due to TR band         +-----------+--------+-----+---------+----------------------------------------+  Left Doppler Findings: +-----------+--------+-----+---------+-----------------------------------------+  Site        Pressure Index Doppler   Comments                                   +-----------+--------+-----+---------+-----------------------------------------+  Brachial    143            triphasic                                            +-----------+--------+-----+---------+-----------------------------------------+  Radial                     triphasic                                            +-----------+--------+-----+---------+-----------------------------------------+  Ulnar                      triphasic                                            +-----------+--------+-----+---------+-----------------------------------------+  Palmar Arch                          Signal is unaffected with radial                                                 compression, decreases 50% with ulnar                                            compression.                                +-----------+--------+-----+---------+-----------------------------------------+  Summary: Right Carotid: Velocities in the right ICA are  consistent with a 1-39% stenosis. Left Carotid: Velocities in the left ICA are consistent with a 1-39% stenosis. Vertebrals:  Bilateral vertebral arteries demonstrate antegrade flow. Subclavians: Normal flow hemodynamics were seen in bilateral subclavian              arteries. Right ABI: Resting right ankle-brachial index is within normal range. No evidence of significant right lower extremity arterial disease. Left ABI: Resting left ankle-brachial index is within normal range. No evidence of significant left lower extremity arterial disease.  Electronically signed by Curt Jews MD on 10/26/2018 at 3:57:52 PM.    Final     Cardiac Studies   TTE: 10/25/18  IMPRESSIONS  1. Left ventricular ejection fraction, by visual estimation, is 55 to 60%. The left ventricle has normal function. Normal left ventricular size. There is no left ventricular hypertrophy. 2. Global right ventricle has normal systolic function.The right ventricular size is mildly enlarged. No increase in right ventricular wall thickness. 3. Left atrial size was mildly dilated. 4. Right atrial size was normal. 5. Mild to moderate mitral annular calcification. 6. The mitral valve is normal in structure. Trace mitral valve regurgitation. No evidence of mitral stenosis. 7. The tricuspid valve is normal in structure. Tricuspid valve regurgitation is trivial. 8. The aortic valve is tricuspid Aortic valve regurgitation was not visualized by color flow Doppler. Mild aortic valve sclerosis without stenosis. 9. The pulmonic valve was normal in structure. Pulmonic valve regurgitation is not visualized by color flow Doppler. 10. TR signal is inadequate for assessing pulmonary artery systolic pressure.   10/25/2018-coronary angiography  1. Severe complex distal left main disease extending into the  LAD and LCx ostia 2. Severe proximal LAD stenosis 3. Patent, non-dominant RCA without high grade stenosis  Recommend: urgent cardiac surgical consultation   10/26/2018-echocardiogram  POST-OP IMPRESSIONS - Left Ventricle: The left ventricle is unchanged from pre-bypass. - Aorta: The aorta appears unchanged from pre-bypass. - Left Atrial Appendage: The left atrial appendage appears unchanged from pre-bypass. - Aortic Valve: The aortic valve appears unchanged from pre-bypass. - Mitral Valve: The mitral valve appears unchanged from pre-bypass. - Tricuspid Valve: The tricuspid valve appears unchanged from pre-bypass. - Pericardium: The pericardium appears unchanged from pre-bypass.  PRE-OP FINDINGS  Left Ventricle: The left ventricle has low normal systolic function, with an ejection fraction of 50-55%. The cavity size was normal. There is mild concentric left ventricular hypertrophy.  Right Ventricle: The right ventricle has normal systolic function. The cavity was mildly enlarged. There is no increase in right ventricular wall thickness.  Left Atrium: Left atrial size was normal in size. The left atrial appendage is well visualized and there is evidence of thrombus present. The left atrial appendage is well visualized and there is no evidence of thrombus present.  Right Atrium: Right atrial size was normal in size. Right atrial pressure is estimated at 10 mmHg.  Interatrial Septum: No atrial level shunt detected by color flow Doppler.  Pericardium: There is no evidence of pericardial effusion.  Mitral Valve: The mitral valve is normal in structure. No thickening of the mitral valve leaflet. Mitral valve regurgitation is trivial by color flow Doppler.  Tricuspid Valve: The tricuspid valve was normal in structure. Tricuspid valve regurgitation is mild-moderate by color flow Doppler. No TV vegetation was visualized.  Aortic Valve: The aortic valve is tricuspid Aortic valve  regurgitation is trivial by color flow Doppler. There is no evidence of aortic valve stenosis.  Pulmonic Valve: The pulmonic valve was normal in structure. Pulmonic  valve regurgitation is mild by color flow Doppler.   Patient Profile     [name] is a 63 y.o. male with a hx of HTN, HL, DM, OSA, gout who is being seen today for the evaluation of chest pain  status post LHC which reviewed severe complex LAD disease now status post CABG  Assessment & Plan    #Severe LAD disease status post CABG x3 POD 1 He is progressing pretty well and currently denies shortness of breath.  There is no evidence of dysrhythmia postoperatively.  Echocardiogram revealed LVEF of 50-55% and he is currently without signs of volume overload. -Continue aspirin, Lipitor, metoprolol  #Hypertension BP stable -Continue metoprolol  #Hyperlipidemia -Continue Lipitor  #Diabetes mellitus -Continue insulin -Will most likely benefit from SGLT2 inhibitor at discharge  #CKD stage III Serum creatinine this morning is 1.3 (Baseline 1.5-1.7) -Continue to monitor  For questions or updates, please contact [facility] Please consult [email or web address] for contact info under Cardiology/STEMI.      Signed, Jean Rosenthal, MD  10/27/2018, 7:39 AM

## 2018-10-28 ENCOUNTER — Inpatient Hospital Stay (HOSPITAL_COMMUNITY): Payer: PPO

## 2018-10-28 DIAGNOSIS — I25700 Atherosclerosis of coronary artery bypass graft(s), unspecified, with unstable angina pectoris: Secondary | ICD-10-CM

## 2018-10-28 LAB — CBC
HCT: 31.9 % — ABNORMAL LOW (ref 39.0–52.0)
Hemoglobin: 10.1 g/dL — ABNORMAL LOW (ref 13.0–17.0)
MCH: 27.2 pg (ref 26.0–34.0)
MCHC: 31.7 g/dL (ref 30.0–36.0)
MCV: 86 fL (ref 80.0–100.0)
Platelets: 176 10*3/uL (ref 150–400)
RBC: 3.71 MIL/uL — ABNORMAL LOW (ref 4.22–5.81)
RDW: 13.7 % (ref 11.5–15.5)
WBC: 11.4 10*3/uL — ABNORMAL HIGH (ref 4.0–10.5)
nRBC: 0 % (ref 0.0–0.2)

## 2018-10-28 LAB — BASIC METABOLIC PANEL
Anion gap: 8 (ref 5–15)
BUN: 19 mg/dL (ref 8–23)
CO2: 25 mmol/L (ref 22–32)
Calcium: 8.9 mg/dL (ref 8.9–10.3)
Chloride: 103 mmol/L (ref 98–111)
Creatinine, Ser: 1.45 mg/dL — ABNORMAL HIGH (ref 0.61–1.24)
GFR calc Af Amer: 59 mL/min — ABNORMAL LOW (ref >60)
GFR calc non Af Amer: 51 mL/min — ABNORMAL LOW (ref >60)
Glucose, Bld: 203 mg/dL — ABNORMAL HIGH (ref 70–99)
Potassium: 4.4 mmol/L (ref 3.5–5.1)
Sodium: 136 mmol/L (ref 135–145)

## 2018-10-28 LAB — GLUCOSE, CAPILLARY
Glucose-Capillary: 119 mg/dL — ABNORMAL HIGH (ref 70–99)
Glucose-Capillary: 131 mg/dL — ABNORMAL HIGH (ref 70–99)
Glucose-Capillary: 133 mg/dL — ABNORMAL HIGH (ref 70–99)
Glucose-Capillary: 141 mg/dL — ABNORMAL HIGH (ref 70–99)
Glucose-Capillary: 144 mg/dL — ABNORMAL HIGH (ref 70–99)
Glucose-Capillary: 205 mg/dL — ABNORMAL HIGH (ref 70–99)

## 2018-10-28 MED ORDER — METOPROLOL TARTRATE 25 MG PO TABS
25.0000 mg | ORAL_TABLET | Freq: Two times a day (BID) | ORAL | Status: DC
  Administered 2018-10-28 (×2): 25 mg via ORAL
  Filled 2018-10-28 (×2): qty 1

## 2018-10-28 MED ORDER — FUROSEMIDE 10 MG/ML IJ SOLN
40.0000 mg | Freq: Once | INTRAMUSCULAR | Status: AC
  Administered 2018-10-28: 40 mg via INTRAVENOUS
  Filled 2018-10-28: qty 4

## 2018-10-28 MED FILL — Electrolyte-R (PH 7.4) Solution: INTRAVENOUS | Qty: 3000 | Status: AC

## 2018-10-28 MED FILL — Heparin Sodium (Porcine) Inj 1000 Unit/ML: INTRAMUSCULAR | Qty: 10 | Status: AC

## 2018-10-28 MED FILL — Sodium Bicarbonate IV Soln 8.4%: INTRAVENOUS | Qty: 50 | Status: AC

## 2018-10-28 MED FILL — Mannitol IV Soln 20%: INTRAVENOUS | Qty: 500 | Status: AC

## 2018-10-28 MED FILL — Sodium Chloride IV Soln 0.9%: INTRAVENOUS | Qty: 3000 | Status: AC

## 2018-10-28 NOTE — Progress Notes (Signed)
RT offered pt CPAP dream station for the night and pt declined stating it makes him feel anxious and he sleeps better without it. Pt respiratory status is stable at this time. RT will continue to monitor.

## 2018-10-28 NOTE — Progress Notes (Signed)
RT placed pt on CPAP dream station for the night. Pt is on his home setting of 12 cm H2O w/3 Lpm bled into the system. Pt respiratory status is stable at this time. RT will continue to monitor.

## 2018-10-28 NOTE — Evaluation (Signed)
Physical Therapy Evaluation Patient Details Name: [name] MRN: [id number] DOB: [birth year] Today's Date: 10/28/2018   History of Present Illness  Patient is a 63 y/o male who presents with chest pain. 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 PMH includes HTN, HLD, DM.  Clinical Impression  Patient presents with impaired cardiovascular endurance, decreased activity tolerance, dyspnea on exertion and impaired mobility s/p above. Pt independent PTA, worked out daily ([employer] and walking at the [employer]) and works as Games developer. Today, pt tolerated bed mobility, transfers and gait training with min guard assist for balance/safety. Education re: sternal precautions, "move in the tube," and mobility recommendations. Likely pt will transition to not needing a RW for home but recommend using one right now for safety. VSS except increased RR to 40s during mobility. Will follow acutely to maximize independence and mobility prior to return home.    Follow Up Recommendations Other (comment)(Cardiac outpt rehab)    Equipment Recommendations  None recommended by PT    Recommendations for Other Services       Precautions / Restrictions Precautions Precautions: Other (comment) Precaution Comments: watch 02 Restrictions Weight Bearing Restrictions: Yes(sternal precautions)      Mobility  Bed Mobility Overal bed mobility: Needs Assistance Bed Mobility: Rolling;Sidelying to Sit Rolling: Min guard Sidelying to sit: Min guard;HOB elevated       General bed mobility comments: Cues for technique and use of heart pillow to adhere to sternal precautions.  Transfers Overall transfer level: Needs assistance Equipment used: Rolling walker (2 wheeled) Transfers: Sit to/from Stand Sit to Stand: Min guard         General transfer comment: Min guard for safety. Stood from EOB with use of heart pillow, good technique. Slow to rise. Transferred to chair post  ambulation.  Ambulation/Gait Ambulation/Gait assistance: Min guard Gait Distance (Feet): 295 Feet Assistive device: Rolling walker (2 wheeled) Gait Pattern/deviations: Step-through pattern;Decreased stride length Gait velocity: very slow   General Gait Details: Very slow, guarded and steady gait. 2/4 DOe. RR up to 40s. Sp02 with difficult reading, like high 80s,-low 90s on RA. 1 standing rest break.  Stairs            Wheelchair Mobility    Modified Rankin (Stroke Patients Only)       Balance Overall balance assessment: No apparent balance deficits (not formally assessed)                                           Pertinent Vitals/Pain Pain Assessment: Faces Faces Pain Scale: Hurts little more Pain Location: chest Pain Descriptors / Indicators: Sore Pain Intervention(s): Repositioned;Monitored during session    Home Living Family/patient expects to be discharged to:: Private residence Living Arrangements: Spouse/significant other Available Help at Discharge: Family;Available 24 hours/day Type of Home: House Home Access: Stairs to enter Entrance Stairs-Rails: None Entrance Stairs-Number of Steps: 4 Home Layout: One level Home Equipment: Walker - 4 wheels;Cane - single point      Prior Function Level of Independence: Independent         Comments: 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     Hand Dominance        Extremity/Trunk Assessment   Upper Extremity Assessment Upper Extremity Assessment: Defer to OT evaluation    Lower Extremity  Assessment Lower Extremity Assessment: Overall WFL for tasks assessed    Cervical / Trunk Assessment Cervical / Trunk Assessment: Normal  Communication   Communication: HOH  Cognition Arousal/Alertness: Awake/alert Behavior During Therapy: WFL for tasks assessed/performed Overall Cognitive Status: Within  Functional Limits for tasks assessed                                        General Comments General comments (skin integrity, edema, etc.): VSS throughout with elevated RR in 40s during mobility. Wife present during session.    Exercises     Assessment/Plan    PT Assessment Patient needs continued PT services  PT Problem List Decreased mobility;Cardiopulmonary status limiting activity;Pain;Decreased activity tolerance       PT Treatment Interventions Therapeutic activities;Gait training;Therapeutic exercise;Stair training;Patient/family education;Functional mobility training    PT Goals (Current goals can be found in the Care Plan section)  Acute Rehab PT Goals Patient Stated Goal: to get back to weight training PT Goal Formulation: With patient Time For Goal Achievement: 11/11/18 Potential to Achieve Goals: Good    Frequency Min 3X/week   Barriers to discharge        Co-evaluation               AM-PAC PT "6 Clicks" Mobility  Outcome Measure Help needed turning from your back to your side while in a flat bed without using bedrails?: None Help needed moving from lying on your back to sitting on the side of a flat bed without using bedrails?: None Help needed moving to and from a bed to a chair (including a wheelchair)?: A Little Help needed standing up from a chair using your arms (e.g., wheelchair or bedside chair)?: A Little Help needed to walk in hospital room?: A Little Help needed climbing 3-5 steps with a railing? : A Little 6 Click Score: 20    End of Session Equipment Utilized During Treatment: Gait belt Activity Tolerance: Patient tolerated treatment well Patient left: in chair;with call bell/phone within reach;with family/visitor present Nurse Communication: Mobility status PT Visit Diagnosis: Difficulty in walking, not elsewhere classified (R26.2)    Time: 7517-0017 PT Time Calculation (min) (ACUTE ONLY): 26 min   Charges:   PT  Evaluation $PT Eval Moderate Complexity: 1 Mod PT Treatments $Gait Training: 8-22 mins        Wray Kearns, PT, DPT Acute Rehabilitation Services Pager [phone] Office [phone]      Marguarite Arbour A Sabra Heck 10/28/2018, 12:47 PM

## 2018-10-28 NOTE — Progress Notes (Signed)
      [address]       [name] [address]             [phone]                 2 Days Post-Op Procedure(s) (LRB): CORONARY ARTERY BYPASS GRAFTING (CABG) x3 using the LIMA to LAD and right greater saphenous vein via endoscopic harvest to the Diag 1 and OM 2. (N/A)   Events: No events _______________________________________________________________ Vitals: BP 125/87   Pulse 86   Temp 98.1 F (36.7 C) (Oral)   Resp (!) 32   Ht 5\' 11"  (1.803 m)   Wt 100.4 kg   SpO2 92%   BMI 30.87 kg/m   - Neuro: alert NAD  - Cardiovascular: sinus  Drips: none.     - Pulm: EWOB  ABG    Component Value Date/Time   PHART 7.328 (L) 10/26/2018 1744   PCO2ART 45.8 10/26/2018 1744   PO2ART 132.0 (H) 10/26/2018 1744   HCO3 24.1 10/26/2018 1744   TCO2 25 10/26/2018 1744   ACIDBASEDEF 2.0 10/26/2018 1744   O2SAT 99.0 10/26/2018 1744    - Abd: S/NT/ND - Extremity: trace edema  .Intake/Output      10/22 0701 - 10/23 0700 10/23 0701 - 10/24 0700   P.O. 580    I.V. (mL/kg) 104.5 (1)    Blood     IV Piggyback 135.1    Total Intake(mL/kg) 819.5 (8.2)    Urine (mL/kg/hr) 2850 (1.2) 2735 (2.6)   Emesis/NG output     Stool  0   Blood     Chest Tube 390 65   Total Output 3240 2800   Net -2420.5 -2800        Urine Occurrence  1 x   Stool Occurrence  1 x      _______________________________________________________________ Labs: CBC Latest Ref Rng & Units 10/28/2018 10/27/2018 10/27/2018  WBC 4.0 - 10.5 K/uL 11.4(H) 9.8 9.0  Hemoglobin 13.0 - 17.0 g/dL 10.1(L) 9.7(L) 9.6(L)  Hematocrit 39.0 - 52.0 % 31.9(L) 29.6(L) 30.0(L)  Platelets 150 - 400 K/uL 176 149(L) 166   CMP Latest Ref Rng & Units 10/28/2018 10/27/2018 10/27/2018  Glucose 70 - 99 mg/dL 203(H) 157(H) 105(H)  BUN 8 - 23 mg/dL 19 20 19   Creatinine 0.61 - 1.24 mg/dL 1.45(H) 1.49(H) 1.38(H)  Sodium 135 - 145 mmol/L 136 136 138  Potassium 3.5 - 5.1 mmol/L 4.4 4.5 4.6  Chloride 98 - 111 mmol/L 103 104  108  CO2 22 - 32 mmol/L 25 24 23   Calcium 8.9 - 10.3 mg/dL 8.9 8.6(L) 8.6(L)  Total Protein 6.5 - 8.1 g/dL - - -  Total Bilirubin 0.3 - 1.2 mg/dL - - -  Alkaline Phos 38 - 126 U/L - - -  AST 15 - 41 U/L - - -  ALT 0 - 44 U/L - - -    CXR: clear  _______________________________________________________________  Assessment and Plan: POD 2 s/p CABG doing well  Neuro: pain controlled CV: on A/S/BB. Will remove invasive lines   Pulm: pulm toilet.  Will keep CT for now Renal: good uop.  Creat slightly upGentle diuresis today GI: advancing diet Heme: stable ID: [email or web address] Endo: SSI  Dispo: floor  Melodie Bouillon, MD 10/28/2018 5:26 PM

## 2018-10-28 NOTE — Progress Notes (Signed)
   Ding better this AM.   Independently bathing and performing personal hygeine.  No AF.

## 2018-10-29 ENCOUNTER — Inpatient Hospital Stay (HOSPITAL_COMMUNITY): Payer: PPO

## 2018-10-29 DIAGNOSIS — I2510 Atherosclerotic heart disease of native coronary artery without angina pectoris: Secondary | ICD-10-CM

## 2018-10-29 DIAGNOSIS — Z951 Presence of aortocoronary bypass graft: Secondary | ICD-10-CM

## 2018-10-29 LAB — BASIC METABOLIC PANEL
Anion gap: 8 (ref 5–15)
BUN: 21 mg/dL (ref 8–23)
CO2: 27 mmol/L (ref 22–32)
Calcium: 9 mg/dL (ref 8.9–10.3)
Chloride: 104 mmol/L (ref 98–111)
Creatinine, Ser: 1.53 mg/dL — ABNORMAL HIGH (ref 0.61–1.24)
GFR calc Af Amer: 55 mL/min — ABNORMAL LOW (ref >60)
GFR calc non Af Amer: 48 mL/min — ABNORMAL LOW (ref >60)
Glucose, Bld: 130 mg/dL — ABNORMAL HIGH (ref 70–99)
Potassium: 4.4 mmol/L (ref 3.5–5.1)
Sodium: 139 mmol/L (ref 135–145)

## 2018-10-29 LAB — BPAM RBC
Blood Product Expiration Date: 202011262359
Blood Product Expiration Date: 202011262359
Unit Type and Rh: 5100
Unit Type and Rh: 5100

## 2018-10-29 LAB — TYPE AND SCREEN
ABO/RH(D): O POS
Antibody Screen: NEGATIVE
Unit division: 0
Unit division: 0

## 2018-10-29 LAB — GLUCOSE, CAPILLARY
Glucose-Capillary: 112 mg/dL — ABNORMAL HIGH (ref 70–99)
Glucose-Capillary: 187 mg/dL — ABNORMAL HIGH (ref 70–99)
Glucose-Capillary: 157 mg/dL — ABNORMAL HIGH (ref 70–99)
Glucose-Capillary: 130 mg/dL — ABNORMAL HIGH (ref 70–99)
Glucose-Capillary: 117 mg/dL — ABNORMAL HIGH (ref 70–99)

## 2018-10-29 LAB — CBC
HCT: 29.8 % — ABNORMAL LOW (ref 39.0–52.0)
Hemoglobin: 9.8 g/dL — ABNORMAL LOW (ref 13.0–17.0)
MCH: 27.2 pg (ref 26.0–34.0)
MCHC: 32.9 g/dL (ref 30.0–36.0)
MCV: 82.8 fL (ref 80.0–100.0)
Platelets: 170 10*3/uL (ref 150–400)
RBC: 3.6 MIL/uL — ABNORMAL LOW (ref 4.22–5.81)
RDW: 13.2 % (ref 11.5–15.5)
WBC: 10.1 10*3/uL (ref 4.0–10.5)
nRBC: 0 % (ref 0.0–0.2)

## 2018-10-29 MED ORDER — METOPROLOL TARTRATE 25 MG PO TABS
25.0000 mg | ORAL_TABLET | Freq: Three times a day (TID) | ORAL | Status: DC
  Administered 2018-10-29 – 2018-10-31 (×6): 25 mg via ORAL
  Filled 2018-10-29 (×6): qty 1

## 2018-10-29 NOTE — Progress Notes (Signed)
3 Days Post-Op Procedure(s) (LRB): CORONARY ARTERY BYPASS GRAFTING (CABG) x3 using the LIMA to LAD and right greater saphenous vein via endoscopic harvest to the Diag 1 and OM 2. (N/A) Subjective: No complaints  Objective: Vital signs in last 24 hours: Temp:  [97.7 F (36.5 C)-98.4 F (36.9 C)] 97.7 F (36.5 C) (10/24 1410) Pulse Rate:  [74-95] 94 (10/24 1300) Cardiac Rhythm: Normal sinus rhythm (10/24 1410) Resp:  [18-43] 24 (10/24 1410) BP: (105-141)/(72-92) 141/83 (10/24 1410) SpO2:  [89 %-99 %] 99 % (10/24 1410) Weight:  [98.4 kg] 98.4 kg (10/24 0500)  Hemodynamic parameters for last 24 hours:    Intake/Output from previous day: 10/23 0701 - 10/24 0700 In: 240 [P.O.:240] Out: 3880 [Urine:3785; Chest Tube:95] Intake/Output this shift: Total I/O In: 360 [P.O.:360] Out: 250 [Urine:250]  General appearance: alert and cooperative Neurologic: intact Heart: regular rate and rhythm, S1, S2 normal, no murmur, click, rub or gallop Lungs: clear to auscultation bilaterally Wound: dressed, dry  Lab Results: Recent Labs    10/28/18 0502 10/29/18 0234  WBC 11.4* 10.1  HGB 10.1* 9.8*  HCT 31.9* 29.8*  PLT 176 170   BMET:  Recent Labs    10/28/18 0502 10/29/18 0234  NA 136 139  K 4.4 4.4  CL 103 104  CO2 25 27  GLUCOSE 203* 130*  BUN 19 21  CREATININE 1.45* 1.53*  CALCIUM 8.9 9.0    PT/INR: No results for input(s): LABPROT, INR in the last 72 hours. ABG    Component Value Date/Time   PHART 7.328 (L) 10/26/2018 1744   HCO3 24.1 10/26/2018 1744   TCO2 25 10/26/2018 1744   ACIDBASEDEF 2.0 10/26/2018 1744   O2SAT 99.0 10/26/2018 1744   CBG (last 3)  Recent Labs    10/29/18 0424 10/29/18 0813 10/29/18 1127  GLUCAP 112* 187* 157*    Assessment/Plan: S/P Procedure(s) (LRB): CORONARY ARTERY BYPASS GRAFTING (CABG) x3 using the LIMA to LAD and right greater saphenous vein via endoscopic harvest to the Diag 1 and OM 2. (N/A) Mobilize Diuresis d/c  tubes/lines Plan for transfer to step-down: see transfer orders   LOS: 3 days    Wonda Olds 10/29/2018

## 2018-10-30 LAB — BASIC METABOLIC PANEL
Anion gap: 8 (ref 5–15)
BUN: 22 mg/dL (ref 8–23)
CO2: 27 mmol/L (ref 22–32)
Calcium: 8.9 mg/dL (ref 8.9–10.3)
Chloride: 103 mmol/L (ref 98–111)
Creatinine, Ser: 1.46 mg/dL — ABNORMAL HIGH (ref 0.61–1.24)
GFR calc Af Amer: 58 mL/min — ABNORMAL LOW (ref >60)
GFR calc non Af Amer: 50 mL/min — ABNORMAL LOW (ref >60)
Glucose, Bld: 137 mg/dL — ABNORMAL HIGH (ref 70–99)
Potassium: 4.3 mmol/L (ref 3.5–5.1)
Sodium: 138 mmol/L (ref 135–145)

## 2018-10-30 LAB — GLUCOSE, CAPILLARY
Glucose-Capillary: 103 mg/dL — ABNORMAL HIGH (ref 70–99)
Glucose-Capillary: 142 mg/dL — ABNORMAL HIGH (ref 70–99)
Glucose-Capillary: 149 mg/dL — ABNORMAL HIGH (ref 70–99)
Glucose-Capillary: 172 mg/dL — ABNORMAL HIGH (ref 70–99)
Glucose-Capillary: 190 mg/dL — ABNORMAL HIGH (ref 70–99)
Glucose-Capillary: 234 mg/dL — ABNORMAL HIGH (ref 70–99)
Glucose-Capillary: 72 mg/dL (ref 70–99)

## 2018-10-30 MED ORDER — INSULIN ASPART 100 UNIT/ML ~~LOC~~ SOLN
0.0000 [IU] | Freq: Three times a day (TID) | SUBCUTANEOUS | Status: DC
  Administered 2018-10-30: 4 [IU] via SUBCUTANEOUS

## 2018-10-30 NOTE — Progress Notes (Signed)
Patient refused CPAP.

## 2018-10-30 NOTE — Progress Notes (Addendum)
Pt Day 4 post CABG Remains in SR   Note plans for d/c tomorrow Wll make sure he has follow up in cardiology   Dorris Carnes MD

## 2018-10-30 NOTE — Progress Notes (Signed)
      [address]       [place] [address]             [phone]      4 Days Post-Op Procedure(s) (LRB): CORONARY ARTERY BYPASS GRAFTING (CABG) x3 using the LIMA to LAD and right greater saphenous vein via endoscopic harvest to the Diag 1 and OM 2. (N/A) Subjective: Tired this morning since he didn't get much sleep.  Objective: Vital signs in last 24 hours: Temp:  [97.7 F (36.5 C)-98.7 F (37.1 C)] 97.7 F (36.5 C) (10/25 0744) Pulse Rate:  [78-95] 81 (10/25 0803) Cardiac Rhythm: Normal sinus rhythm (10/25 0744) Resp:  [15-32] 20 (10/25 0744) BP: (105-141)/(72-86) 117/74 (10/25 0803) SpO2:  [94 %-99 %] 96 % (10/25 0744) Weight:  [97.5 kg] 97.5 kg (10/25 0306)     Intake/Output from previous day: 10/24 0701 - 10/25 0700 In: 810 [P.O.:810] Out: 1325 [Urine:1325] Intake/Output this shift: No intake/output data recorded.  General appearance: alert, cooperative and no distress Heart: regular rate and rhythm, S1, S2 normal, no murmur, click, rub or gallop Lungs: clear to auscultation bilaterally Abdomen: soft, non-tender; bowel sounds normal; no masses,  no organomegaly Extremities: extremities normal, atraumatic, no cyanosis or edema Wound: clean and dry  Lab Results: Recent Labs    10/28/18 0502 10/29/18 0234  WBC 11.4* 10.1  HGB 10.1* 9.8*  HCT 31.9* 29.8*  PLT 176 170   BMET:  Recent Labs    10/29/18 0234 10/30/18 0350  NA 139 138  K 4.4 4.3  CL 104 103  CO2 27 27  GLUCOSE 130* 137*  BUN 21 22  CREATININE 1.53* 1.46*  CALCIUM 9.0 8.9    PT/INR: No results for input(s): LABPROT, INR in the last 72 hours. ABG    Component Value Date/Time   PHART 7.328 (L) 10/26/2018 1744   HCO3 24.1 10/26/2018 1744   TCO2 25 10/26/2018 1744   ACIDBASEDEF 2.0 10/26/2018 1744   O2SAT 99.0 10/26/2018 1744   CBG (last 3)  Recent Labs    10/29/18 1959 10/30/18 0002 10/30/18 0451  GLUCAP 117* 103* 142*    Assessment/Plan: S/P  Procedure(s) (LRB): CORONARY ARTERY BYPASS GRAFTING (CABG) x3 using the LIMA to LAD and right greater saphenous vein via endoscopic harvest to the Diag 1 and OM 2. (N/A)  1. CV-NSR in the 70s, BP well controlled. ASA, Lipitor, and metoprolol 2. Pulm-tolerating room air with good oxygenation 3. Renal-creatinine 1.46, electrolytes okay. Has been stable 4. H and H 9.7/29.6, expected acute blood loss anemia 5. Endo-blood glucose well controlled  Plan: Chest tubes and pacing wires out. Patient is doing well POD 4. Likely home tomorrow.    LOS: 4 days    Elgie Collard 10/30/2018

## 2018-10-30 NOTE — Progress Notes (Signed)
Patient refused CPAP tonight 

## 2018-10-30 NOTE — Plan of Care (Signed)
  Problem: Education: Goal: Knowledge of General Education information will improve Description Including pain rating scale, medication(s)/side effects and non-pharmacologic comfort measures Outcome: Progressing   Problem: Health Behavior/Discharge Planning: Goal: Ability to manage health-related needs will improve Outcome: Progressing   

## 2018-10-31 ENCOUNTER — Inpatient Hospital Stay (HOSPITAL_COMMUNITY): Payer: PPO

## 2018-10-31 LAB — BASIC METABOLIC PANEL
Anion gap: 10 (ref 5–15)
BUN: 25 mg/dL — ABNORMAL HIGH (ref 8–23)
CO2: 25 mmol/L (ref 22–32)
Calcium: 9.2 mg/dL (ref 8.9–10.3)
Chloride: 105 mmol/L (ref 98–111)
Creatinine, Ser: 1.58 mg/dL — ABNORMAL HIGH (ref 0.61–1.24)
GFR calc Af Amer: 53 mL/min — ABNORMAL LOW (ref >60)
GFR calc non Af Amer: 46 mL/min — ABNORMAL LOW (ref >60)
Glucose, Bld: 92 mg/dL (ref 70–99)
Potassium: 4 mmol/L (ref 3.5–5.1)
Sodium: 140 mmol/L (ref 135–145)

## 2018-10-31 LAB — GLUCOSE, CAPILLARY: Glucose-Capillary: 100 mg/dL — ABNORMAL HIGH (ref 70–99)

## 2018-10-31 MED ORDER — METOPROLOL TARTRATE 25 MG PO TABS: 25.0000 mg | ORAL_TABLET | Freq: Three times a day (TID) | ORAL | 3 refills | Status: DC

## 2018-10-31 MED ORDER — ACETAMINOPHEN 500 MG PO TABS: 1000.0000 mg | ORAL_TABLET | Freq: Four times a day (QID) | ORAL | 0 refills | Status: DC | PRN

## 2018-10-31 MED ORDER — ATORVASTATIN CALCIUM 80 MG PO TABS: 80.0000 mg | ORAL_TABLET | Freq: Every day | ORAL | 3 refills | Status: DC

## 2018-10-31 MED ORDER — TRAMADOL HCL 50 MG PO TABS: 50.0000 mg | ORAL_TABLET | ORAL | 0 refills | Status: DC | PRN

## 2018-10-31 NOTE — Progress Notes (Signed)
Order received to discharge patient.  Telemetry monitor removed and CCMD notified.  PIV access removed.  Discharge instructions, follow up, medications and instructions for their use discussed with patient. 

## 2018-10-31 NOTE — Progress Notes (Addendum)
CARDIAC REHAB PHASE I   Discharge education completed with pt and wife. Pt instructed on importance of showers and monitoring incisions daily. Encouraged continued IS use, walks, and sternal precautions. Pt given in-the-tube sheet along with heart healthy and diabetic diets. Reviewed restrictions and exercise guidelines. Will refer to CRP II [place], pt interested in coming in-person. Pt denies difficulty with ambulation, states he has a walker at home if needed, denies need for other devices. Pt is interested in participating in Virtual Cardiac and Pulmonary Rehab. Pt advised that Virtual Cardiac and Pulmonary Rehab is provided at no cost to the patient.  Checklist:  1. Pt has smart device  ie smartphone and/or ipad for downloading an app  Yes 2. Reliable internet/wifi service    Yes 3. Understands how to use their smartphone and navigate within an app.  Yes  Pt verbalized understanding and is in agreement.  [id number] Rufina Falco, RN BSN 10/31/2018 8:59 AM

## 2018-10-31 NOTE — Progress Notes (Addendum)
      [address]       [place] [address]             [phone]      5 Days Post-Op Procedure(s) (LRB): CORONARY ARTERY BYPASS GRAFTING (CABG) x3 using the LIMA to LAD and right greater saphenous vein via endoscopic harvest to the Diag 1 and OM 2. (N/A)   Subjective:  No new complaints.  He denies chest pain and shortness of breath.  He is ready to go home today.  + ambulation  Objective: Vital signs in last 24 hours: Temp:  [97.7 F (36.5 C)-98.7 F (37.1 C)] 98.7 F (37.1 C) (10/26 0533) Pulse Rate:  [63-81] 63 (10/26 0533) Cardiac Rhythm: Normal sinus rhythm (10/25 2000) Resp:  [19-26] 23 (10/26 0533) BP: (104-127)/(70-80) 127/80 (10/26 0533) SpO2:  [96 %-100 %] 100 % (10/26 0533) Weight:  [97.3 kg] 97.3 kg (10/26 0533)   Intake/Output from previous day: 10/25 0701 - 10/26 0700 In: 440 [P.O.:440] Out: 900 [Urine:900]  General appearance: alert, cooperative and no distress Heart: regular rate and rhythm Lungs: clear to auscultation bilaterally Abdomen: soft, non-tender; bowel sounds normal; no masses,  no organomegaly Extremities: edema minimal appreciated Wound: clean and dry  Lab Results: Recent Labs    10/29/18 0234  WBC 10.1  HGB 9.8*  HCT 29.8*  PLT 170   BMET:  Recent Labs    10/29/18 0234 10/30/18 0350  NA 139 138  K 4.4 4.3  CL 104 103  CO2 27 27  GLUCOSE 130* 137*  BUN 21 22  CREATININE 1.53* 1.46*  CALCIUM 9.0 8.9    PT/INR: No results for input(s): LABPROT, INR in the last 72 hours. ABG    Component Value Date/Time   PHART 7.328 (L) 10/26/2018 1744   HCO3 24.1 10/26/2018 1744   TCO2 25 10/26/2018 1744   ACIDBASEDEF 2.0 10/26/2018 1744   O2SAT 99.0 10/26/2018 1744   CBG (last 3)  Recent Labs    10/30/18 2004 10/30/18 2213 10/31/18 0631  GLUCAP 149* 190* 100*    Assessment/Plan: S/P Procedure(s) (LRB): CORONARY ARTERY BYPASS GRAFTING (CABG) x3 using the LIMA to LAD and right greater saphenous vein via  endoscopic harvest to the Diag 1 and OM 2. (N/A)  1. CV- NSR, BP stable, Lopressor TID 2. Pulm- no acute issues, continue IS...  CXR without significant pleural effusion, no pneumothorax 3. Renal- creatinine has been stable, no edema in legs, no lasix at this time 4. CBGs controlled, will restart home Metformin 5. dispo- patient stable, will d/c home today.   LOS: 5 days    Ellwood Handler, PA-C  10/31/2018

## 2018-11-07 DIAGNOSIS — M65321 Trigger finger, right index finger: Secondary | ICD-10-CM | POA: Diagnosis not present

## 2018-11-07 DIAGNOSIS — M65332 Trigger finger, left middle finger: Secondary | ICD-10-CM | POA: Diagnosis not present

## 2018-11-07 DIAGNOSIS — M79642 Pain in left hand: Secondary | ICD-10-CM | POA: Diagnosis not present

## 2018-11-07 DIAGNOSIS — M65331 Trigger finger, right middle finger: Secondary | ICD-10-CM | POA: Diagnosis not present

## 2018-11-07 DIAGNOSIS — M79641 Pain in right hand: Secondary | ICD-10-CM | POA: Diagnosis not present

## 2018-11-07 DIAGNOSIS — M65352 Trigger finger, left little finger: Secondary | ICD-10-CM | POA: Diagnosis not present

## 2018-11-10 NOTE — Progress Notes (Signed)
CARDIOLOGY OFFICE NOTE  Date:  11/15/2018    [name] Date of Birth: 1955/06/29 Medical Record #[id number]  PCP:  Mellody Dance, DO  Cardiologist:  Jennings Books    Chief Complaint  Patient presents with  . Follow-up    History of Present Illness: [name] is a 63 y.o. male who presents today for a post hospital visit. Seen for Dr. Tamala Julian. Former patient of Dr. Kyla Balzarine.   He has a history of CAD, DM, HTN, OSA, GERD and HLD. He has not been seen here since 2016 by Dr. Johnsie Cancel.   He was admitted last month with chest pain. Seen by Dr. Tamala Julian. Cardiac cath showed severe LM disease - referred to [facility] for CABG x 3 with Dr. Kipp Brood with LIMA to LAD, SVG to DX1 and OM.  Tolerated well. Post op course looked to be unremarkable.   The patient does not have symptoms concerning for COVID-19 infection (fever, chills, cough, or new shortness of breath).   Comes in today. Here alone. I put his wife on speaker phone today to join our visit. They both feel like he is doing well. He was fairly active prior to his surgery. He is walking. Appetite is good. Bowels are working ok. Not sleeping well but seems to be improving. He is going up stairs. 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 Asking about sex. Also had a planned procedure for tomorrow that involved sedation/anesthesia and lying on his chest - sounds like they were not aware that he has just had heart surgery.   Past Medical History:  Diagnosis Date  . Carpal tunnel syndrome, bilateral   . Carpal tunnel syndrome, right    nerve impingement right arm/wears brace  . Diabetes mellitus    no meds  . Dyspnea   . GERD (gastroesophageal reflux disease)   . Gout   . Hepatic steatosis   . Hyperlipidemia   . Hypertension   . Obstructive sleep apnea     Past Surgical History:  Procedure Laterality Date  . CARPAL TUNNEL RELEASE  09/10/2015   RIGHT WRIST / WITH NERVE IMPINGEMENT  SURGERY  . CERVICAL FUSION     Dr Sherwood Gambler  . COLONOSCOPY  2007   negative; [facility]  . CORONARY ANGIOGRAPHY N/A 10/25/2018   Procedure: CORONARY ANGIOGRAPHY (CATH LAB);  Surgeon: Sherren Mocha, MD;  Location: [facility];  Service: Cardiovascular;  Laterality: N/A;  . CORONARY ARTERY BYPASS GRAFT N/A 10/26/2018   Procedure: CORONARY ARTERY BYPASS GRAFTING (CABG) x3 using the LIMA to LAD and right greater saphenous vein via endoscopic harvest to the Diag 1 and OM 2.;  Surgeon: Lajuana Matte, MD;  Location: [facility];  Service: Open Heart Surgery;  Laterality: N/A;  . ESOPHAGEAL DILATION  2007  . 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 LUMBAR FUSION      Dr Sherwood Gambler     Medications: Current Meds  Medication Sig  . acetaminophen (TYLENOL) 500 MG tablet Take 2 tablets (1,000 mg total) by mouth every 6 (six) hours as needed.  Marland Kitchen allopurinol (ZYLOPRIM) 300 MG tablet TAKE 1/2 TABLET BY MOUTH DAILY  . amphetamine-dextroamphetamine (ADDERALL) 10 MG tablet Take 1 tablet (10 mg total) by mouth daily with breakfast.  . Ascorbic Acid (VITAMIN C) 1000 MG tablet Take 1,000 mg by mouth daily.  Marland Kitchen aspirin 81 MG tablet Take 81 mg by mouth daily.    Marland Kitchen  atorvastatin (LIPITOR) 80 MG tablet Take 1 tablet (80 mg total) by mouth at bedtime.  . blood glucose meter kit and supplies Dispense based on patient and insurance preference. Use to check fasting blood glucose in the morning and to check glucose 2 hours after largest meal of the day. (FOR ICD-10 E10.9, E11.9).  Marland Kitchen FLUoxetine (PROZAC) 40 MG capsule TAKE 1 CAPSULE BY MOUTH DAILY  . gabapentin (NEURONTIN) 800 MG tablet Take 1 tablet (800 mg total) by mouth 3 (three) times daily.  Marland Kitchen glucose blood test strip Use as instructed  . LANCETS ULTRA FINE MISC Use to check fasting blood glucose in the morning and to check glucose 2 hours after largest meal of the day  . metFORMIN (GLUCOPHAGE-XR) 500 MG 24 hr tablet TAKE 1  TABLET BY MOUTH DAILY WITH BREAKFAST  . metoprolol tartrate (LOPRESSOR) 25 MG tablet Take 1 tablet (25 mg total) by mouth 3 (three) times daily.  . Multiple Vitamins-Iron (MULTIVITAMINS WITH IRON) TABS Take 1 tablet by mouth daily.  . pantoprazole (PROTONIX) 40 MG tablet TAKE 1 TABLET BY MOUTH DAILY  . polyethylene glycol powder (GLYCOLAX/MIRALAX) powder USE AS DIRECTED  . pyridoxine (B-6) 100 MG tablet Take 100 mg by mouth daily.  . traMADol (ULTRAM) 50 MG tablet Take 1-2 tablets (50-100 mg total) by mouth every 4 (four) hours as needed for moderate pain.  . traZODone (DESYREL) 50 MG tablet TAKE 1/2 TO 1 TABLET BY MOUTH AT BEDTIMEAS NEEDED FOR SLEEP  . Vitamin D, Ergocalciferol, (DRISDOL) 1.25 MG (50000 UT) CAPS capsule TAKE 1 CAPSULE BY MOUTH EVERY 7 DAYS     Allergies: Allergies  Allergen Reactions  . Prednisone Other (See Comments)    Caused blood sugar to increase, pt will not take    Social History: The patient  reports that he quit smoking about 42 years ago. He has quit using smokeless tobacco. He reports that he does not drink alcohol or use drugs.   Family History: The patient's family history includes Aneurysm (age of onset: 24) in his mother; Cancer in his maternal uncle and paternal uncle; Diabetes in his sister; Heart attack in his father; Heart disease in his father, sister, and sister; Kidney disease in his brother; Sudden death in his mother; Urolithiasis in his father.   Review of Systems: Please see the history of present illness.   All other systems are reviewed and negative.   Physical Exam: VS:  BP 110/62   Pulse (!) 56   Ht '5\' 11"'  (1.803 m)   Wt 224 lb (101.6 kg)   SpO2 100%   BMI 31.24 kg/m  .  BMI Body mass index is 31.24 kg/m.  Wt Readings from Last 3 Encounters:  11/15/18 224 lb (101.6 kg)  11/11/18 222 lb (100.7 kg)  10/31/18 214 lb 8 oz (97.3 kg)    General: Pleasant black male. Alert and in no acute distress. Muscular build.   HEENT: Normal.   Neck: Supple, no JVD, carotid bruits, or masses noted.  Cardiac: Regular rate and rhythm. No murmurs, rubs, or gallops. No edema. Sternum with some delayed healing but overall stable. Vein harvesting from the right leg ok.  Respiratory:  Lungs are clear to auscultation bilaterally with normal work of breathing.  GI: Soft and nontender.  MS: No deformity or atrophy. Gait and ROM intact.  Skin: Warm and dry. Color is normal.  Neuro:  Strength and sensation are intact and no gross focal deficits noted.  Psych: Alert, appropriate and with  normal affect.   LABORATORY DATA:  EKG:  EKG is ordered today. This demonstrates sinus bradycardia - HR is 56 - anterior T wave changes.  Lab Results  Component Value Date   WBC 10.1 10/29/2018   HGB 9.8 (L) 10/29/2018   HCT 29.8 (L) 10/29/2018   PLT 170 10/29/2018   GLUCOSE 92 10/31/2018   CHOL 158 10/25/2018   TRIG 104 10/25/2018   HDL 47 10/25/2018   LDLCALC 90 10/25/2018   ALT 28 10/24/2018   AST 41 10/24/2018   NA 140 10/31/2018   K 4.0 10/31/2018   CL 105 10/31/2018   CREATININE 1.58 (H) 10/31/2018   BUN 25 (H) 10/31/2018   CO2 25 10/31/2018   TSH 3.370 02/09/2018   PSA 1.01 05/15/2015   INR 1.2 10/26/2018   HGBA1C 6.6 (H) 10/26/2018   MICROALBUR 80 12/07/2016     BNP (last 3 results) No results for input(s): BNP in the last 8760 hours.  ProBNP (last 3 results) No results for input(s): PROBNP in the last 8760 hours.   Other Studies Reviewed Today:  OPERATIVE NOTE 10/26/2018 Pre-Op Dx:Lm CAD, DM, HTN, stable angina Post-op JM:EQAS Procedure: CABG X3. LIMA LAD, RSVG D1, OM2 Endoscopic greater saphenous vein harvest on theright Intra-operative Transesophageal Echocardiogram  CORONARY ANGIOGRAPHY (CATH LAB) 10/2018  Conclusion  1. Severe complex distal left main disease extending into the LAD and LCx ostia 2. Severe proximal LAD stenosis 3. Patent, non-dominant RCA without high grade stenosis  Recommend:  urgent cardiac surgical consultation   ECHO IMPRESSIONS 10/2018   1. Left ventricular ejection fraction, by visual estimation, is 55 to 60%. The left ventricle has normal function. Normal left ventricular size. There is no left ventricular hypertrophy.  2. Global right ventricle has normal systolic function.The right ventricular size is mildly enlarged. No increase in right ventricular wall thickness.  3. Left atrial size was mildly dilated.  4. Right atrial size was normal.  5. Mild to moderate mitral annular calcification.  6. The mitral valve is normal in structure. Trace mitral valve regurgitation. No evidence of mitral stenosis.  7. The tricuspid valve is normal in structure. Tricuspid valve regurgitation is trivial.  8. The aortic valve is tricuspid Aortic valve regurgitation was not visualized by color flow Doppler. Mild aortic valve sclerosis without stenosis.  9. The pulmonic valve was normal in structure. Pulmonic valve regurgitation is not visualized by color flow Doppler. 10. TR signal is inadequate for assessing pulmonary artery systolic pressure.   Assessment/Plan:  1. Chest pain with subsequent cath - found to have left main disease - now s/p CABG x 3 - doing quite well. Walking. Needs follow up lab today. Reviewed lifting restrictions. Would hold on his back procedure for now - he is not able to lie on his chest. May resume sexual relations as tolerated. Ok to go to cardiac rehab.    2. Post op blood loss anemia and mild CKD - recheck lab today.   3. DM - borderline - on therapy - per PCP  4. HLD - on high intensity statin - will need fasting lab on return visit.   5. Former smoker - has not smoked in over 40 years.   6. HTN - BP is great here today.   7. ADD - on chronic stimulant use  8. COVID-19 Education: The signs and symptoms of COVID-19 were discussed with the patient and how to seek care for testing (follow up with PCP or arrange E-visit).  The importance  of  social distancing, staying at home, hand hygiene and wearing a mask when out in public were discussed today.  Current medicines are reviewed with the patient today.  The patient does not have concerns regarding medicines other than what has been noted above.  The following changes have been made:  See above.  Labs/ tests ordered today include:    Orders Placed This Encounter  Procedures  . Basic metabolic panel  . CBC no Diff  . EKG 12-Lead     Disposition:   FU with [place] in about 4 to 6 weeks with fasting labs.   Patient is agreeable to this plan and will call if any problems develop in the interim.   SignedTruitt Merle, NP  11/15/2018 11:29 AM  [place] [address] [place], [place]  [address] Phone: [phone] Fax: [phone]

## 2018-11-11 ENCOUNTER — Ambulatory Visit (INDEPENDENT_AMBULATORY_CARE_PROVIDER_SITE_OTHER): Payer: Self-pay | Admitting: Thoracic Surgery (Cardiothoracic Vascular Surgery)

## 2018-11-11 ENCOUNTER — Other Ambulatory Visit: Payer: Self-pay

## 2018-11-11 ENCOUNTER — Encounter: Payer: Self-pay | Admitting: Thoracic Surgery (Cardiothoracic Vascular Surgery)

## 2018-11-11 VITALS — BP 113/74 | HR 64 | Temp 97.8°F | Resp 20 | Ht 71.0 in | Wt 222.0 lb

## 2018-11-11 DIAGNOSIS — Z951 Presence of aortocoronary bypass graft: Secondary | ICD-10-CM

## 2018-11-11 DIAGNOSIS — I2510 Atherosclerotic heart disease of native coronary artery without angina pectoris: Secondary | ICD-10-CM

## 2018-11-11 NOTE — Progress Notes (Signed)
      [address]       ,[place] [address]             [phone]        [name] [place] Medical Record #[id number] Date of Birth: August 12, 1955  Referring: [name] Mocha, MD Primary Care: [name] Dance, DO Primary Cardiologist:No primary care provider on file.  Reason for visit:   follow-up  History of Present Illness:     [name] presents for his first follow-up appointment.  He has done very well.  He has no complaints today.  Physical Exam: BP 113/74   Pulse 64   Temp 97.8 F (36.6 C) (Skin)   Resp 20   Ht 5\' 11"  (1.803 m)   Wt 222 lb (100.7 kg)   SpO2 97% Comment: RA  BMI 30.96 kg/m   Alert NAD RRR, no murmur.  Incision clean.  Sternum stable Abdomen soft, NT/ND trace peripheral edema   Diagnostic Studies & Laboratory data:      Assessment / Plan:   S/p CABG.  Doing well Will f/u in 1 month with cxr   [name] 11/11/2018 1:08 PM

## 2018-11-14 ENCOUNTER — Other Ambulatory Visit: Payer: Self-pay | Admitting: Family Medicine

## 2018-11-14 ENCOUNTER — Telehealth: Payer: Self-pay | Admitting: Family Medicine

## 2018-11-14 NOTE — Telephone Encounter (Signed)
Can you please contact patient to schedule Return for Near future for blood work otherwise around 11/09/2018 for DM, ADHD etc. per Dr. Jenetta Downer last note. And for further refills.  Thank you

## 2018-11-15 ENCOUNTER — Other Ambulatory Visit: Payer: Self-pay

## 2018-11-15 ENCOUNTER — Ambulatory Visit: Payer: PPO | Admitting: Nurse Practitioner

## 2018-11-15 ENCOUNTER — Encounter: Payer: Self-pay | Admitting: Nurse Practitioner

## 2018-11-15 VITALS — BP 110/62 | HR 56 | Ht 71.0 in | Wt 224.0 lb

## 2018-11-15 DIAGNOSIS — E7800 Pure hypercholesterolemia, unspecified: Secondary | ICD-10-CM

## 2018-11-15 DIAGNOSIS — I259 Chronic ischemic heart disease, unspecified: Secondary | ICD-10-CM | POA: Diagnosis not present

## 2018-11-15 DIAGNOSIS — Z951 Presence of aortocoronary bypass graft: Secondary | ICD-10-CM | POA: Diagnosis not present

## 2018-11-15 DIAGNOSIS — I10 Essential (primary) hypertension: Secondary | ICD-10-CM | POA: Diagnosis not present

## 2018-11-15 DIAGNOSIS — Z7189 Other specified counseling: Secondary | ICD-10-CM | POA: Diagnosis not present

## 2018-11-15 LAB — BASIC METABOLIC PANEL
BUN/Creatinine Ratio: 20 (ref 10–24)
BUN: 33 mg/dL — ABNORMAL HIGH (ref 8–27)
CO2: 23 mmol/L (ref 20–29)
Calcium: 9.6 mg/dL (ref 8.6–10.2)
Chloride: 105 mmol/L (ref 96–106)
Creatinine, Ser: 1.67 mg/dL — ABNORMAL HIGH (ref 0.76–1.27)
GFR calc Af Amer: 50 mL/min/{1.73_m2} — ABNORMAL LOW (ref >59)
GFR calc non Af Amer: 43 mL/min/{1.73_m2} — ABNORMAL LOW (ref >59)
Glucose: 117 mg/dL — ABNORMAL HIGH (ref 65–99)
Potassium: 5.1 mmol/L (ref 3.5–5.2)
Sodium: 139 mmol/L (ref 134–144)

## 2018-11-15 LAB — CBC
Hematocrit: 29 % — ABNORMAL LOW (ref 37.5–51.0)
Hemoglobin: 9.4 g/dL — ABNORMAL LOW (ref 13.0–17.7)
MCH: 27 pg (ref 26.6–33.0)
MCHC: 32.4 g/dL (ref 31.5–35.7)
MCV: 83 fL (ref 79–97)
Platelets: 328 10*3/uL (ref 150–450)
RBC: 3.48 x10E6/uL — ABNORMAL LOW (ref 4.14–5.80)
RDW: 13.8 % (ref 11.6–15.4)
WBC: 6.6 10*3/uL (ref 3.4–10.8)

## 2018-11-15 NOTE — Patient Instructions (Addendum)
After Visit Summary:  We will be checking the following labs today - BMET & CBC   Medication Instructions:    Continue with your current medicines.    If you need a refill on your cardiac medications before your next appointment, please call your pharmacy.     Testing/Procedures To Be Arranged:  N/A  Follow-Up:   See Dr. Tamala Julian in about 4 to 6 weeks with fasting labs on return.     At [facility], you and your health needs are our priority.  As part of our continuing mission to provide you with exceptional heart care, we have created designated Provider Care Teams.  These Care Teams include your primary Cardiologist (physician) and Advanced Practice Providers (APPs -  Physician Assistants and Nurse Practitioners) who all work together to provide you with the care you need, when you need it.  Special Instructions:  . Stay safe, stay home, wash your hands for at least 20 seconds and wear a mask when out in public.  . It was good to talk with you and your wife today.    Call the [place] office at [phone] if you have any questions, problems or concerns.

## 2018-11-16 ENCOUNTER — Telehealth: Payer: Self-pay | Admitting: *Deleted

## 2018-11-16 DIAGNOSIS — I259 Chronic ischemic heart disease, unspecified: Secondary | ICD-10-CM

## 2018-11-16 DIAGNOSIS — N289 Disorder of kidney and ureter, unspecified: Secondary | ICD-10-CM

## 2018-11-16 DIAGNOSIS — Z951 Presence of aortocoronary bypass graft: Secondary | ICD-10-CM

## 2018-11-16 DIAGNOSIS — I10 Essential (primary) hypertension: Secondary | ICD-10-CM

## 2018-11-16 NOTE — Telephone Encounter (Signed)
Call placed to pt re: lab results, no voicemail set up.

## 2018-11-16 NOTE — Telephone Encounter (Signed)
-----   Message from Burtis Junes, NP sent at 11/16/2018  7:25 AM EST ----- Ok to report. Labs are stable - still with some mild CKD and post op anemia - would continue on current regimen as outlined at his visit. Add BMET and CBC to labs on return please.

## 2018-11-17 ENCOUNTER — Telehealth: Payer: Self-pay | Admitting: Family Medicine

## 2018-11-17 NOTE — Telephone Encounter (Signed)
Patient's wife called states he had surg on 10/21 & his BS has been running 140 to 180 since then ( he was advised by the [facility] nurse to contact his PCP for appt---  Dr. Jenetta Downer has nothing until 11/30 ( gv him that appt) but still wants someone to call him back w/ any advice.  -Forwarding message to med asst to review w/ provider & advise pt @ [phone].  --glh

## 2018-11-18 ENCOUNTER — Other Ambulatory Visit: Payer: Self-pay

## 2018-11-18 NOTE — Patient Outreach (Signed)
[facility]) Care Management  11/18/2018  [name] Feb 01, 1955 [id number]   Social work referral received from [place], [employer].  Referral stated that patient needs any possible resources to assist with food - states his daughter does help but would appreciate a SW call. Patient was admitted 10/19 - 10/26 with unstable angina/CABGx3 10/21 at [facility] and he has history of Type 2 DM, Stage 3 CKD, Dyslipidemia, HTN  Successful outreach to patient today.  Informed patient of [employer] [place]; these resources list all pantries and kitchens in [place] area.  Talked with patient about Application for Food and Nutrition Services.  Will mail to him in case he decides to apply. Will follow up within the next two weeks to ensure receipt of resources.  [name], BSW Social Worker [phone]

## 2018-11-18 NOTE — Telephone Encounter (Signed)
Spoke with Pt. He was diagnosed with diabetes 20 yrs ago but has been able to control with diet and metformin until CABAG. Patient states AM fasting BS running between 140-160. He is not checking his BS 2 hrs after eating and the lowest BS he has had in this time is 99. He is taking Metformin 500 mg 1 tablet in the morning before breakfast. AS, CMA

## 2018-11-18 NOTE — Telephone Encounter (Signed)
Spoke with patient and advise him of the below. He verbalized understanding and had no questions. AS, CMA

## 2018-11-18 NOTE — Telephone Encounter (Signed)
Good Afternoon [employer], Fasting goal is <140 With his 140-160, he is not far off. He recently underwent major surgery+, co-morbidities ,+ >65 of age his goal A1c <8 His last A1c 6.6 just a few weeks ago. He should continue the once daily Metformin 500mg  and follow a diabetic/heart healthy diet. His last BMP 11/15/2018 GFR 50 Do not recommend any adjustment to his current regime. Lab Results  Component Value Date   HGBA1C 6.6 (H) 10/26/2018   HGBA1C 6.6 (H) 08/30/2018   HGBA1C 6.6 (A) 02/08/2018   Thanks, Valetta Fuller

## 2018-11-29 ENCOUNTER — Other Ambulatory Visit: Payer: Self-pay

## 2018-11-29 NOTE — Patient Outreach (Signed)
[facility]) Care Management  11/29/2018  [name] [birth year] [id number]   Successful follow up call to patient today.  He did receive food resources and Application for Food and Nutrition Services that were mailed on 11/18/18.  Patient denied need for further assistance at this time.  Closing case but did encourage him to call if additional needs arise.   [name], BSW Social Worker [phone]

## 2018-12-05 ENCOUNTER — Ambulatory Visit (INDEPENDENT_AMBULATORY_CARE_PROVIDER_SITE_OTHER): Payer: PPO | Admitting: Family Medicine

## 2018-12-05 ENCOUNTER — Other Ambulatory Visit: Payer: Self-pay

## 2018-12-05 ENCOUNTER — Encounter: Payer: Self-pay | Admitting: Family Medicine

## 2018-12-05 VITALS — BP 125/72 | Ht 71.0 in | Wt 224.0 lb

## 2018-12-05 DIAGNOSIS — I25110 Atherosclerotic heart disease of native coronary artery with unstable angina pectoris: Secondary | ICD-10-CM | POA: Diagnosis not present

## 2018-12-05 DIAGNOSIS — Z951 Presence of aortocoronary bypass graft: Secondary | ICD-10-CM

## 2018-12-05 DIAGNOSIS — E1159 Type 2 diabetes mellitus with other circulatory complications: Secondary | ICD-10-CM | POA: Diagnosis not present

## 2018-12-05 DIAGNOSIS — I152 Hypertension secondary to endocrine disorders: Secondary | ICD-10-CM

## 2018-12-05 DIAGNOSIS — F39 Unspecified mood [affective] disorder: Secondary | ICD-10-CM

## 2018-12-05 DIAGNOSIS — E1129 Type 2 diabetes mellitus with other diabetic kidney complication: Secondary | ICD-10-CM | POA: Diagnosis not present

## 2018-12-05 DIAGNOSIS — I10 Essential (primary) hypertension: Secondary | ICD-10-CM | POA: Diagnosis not present

## 2018-12-05 DIAGNOSIS — E559 Vitamin D deficiency, unspecified: Secondary | ICD-10-CM | POA: Diagnosis not present

## 2018-12-05 DIAGNOSIS — E1169 Type 2 diabetes mellitus with other specified complication: Secondary | ICD-10-CM | POA: Diagnosis not present

## 2018-12-05 DIAGNOSIS — N183 Chronic kidney disease, stage 3 unspecified: Secondary | ICD-10-CM

## 2018-12-05 DIAGNOSIS — Z09 Encounter for follow-up examination after completed treatment for conditions other than malignant neoplasm: Secondary | ICD-10-CM

## 2018-12-05 DIAGNOSIS — E785 Hyperlipidemia, unspecified: Secondary | ICD-10-CM

## 2018-12-05 DIAGNOSIS — F900 Attention-deficit hyperactivity disorder, predominantly inattentive type: Secondary | ICD-10-CM

## 2018-12-05 DIAGNOSIS — G479 Sleep disorder, unspecified: Secondary | ICD-10-CM

## 2018-12-05 DIAGNOSIS — G4700 Insomnia, unspecified: Secondary | ICD-10-CM | POA: Insufficient documentation

## 2018-12-05 NOTE — Progress Notes (Signed)
Hosp f/up OV: recent CABG *3   Telehealth office visit note for [name], D.O- at [facility] at [facility]   I connected with current patient today and verified that I am speaking with the correct person using two identifiers.   . Location of the patient: Home . Location of the provider: Office Only the patient (+/- their family members at pt's discretion) and myself were participating in the encounter - This visit type was conducted due to national recommendations for restrictions regarding the COVID-19 Pandemic (e.g. social distancing) in an effort to limit this patient's exposure and mitigate transmission in our community.  This format is felt to be most appropriate for this patient at this time.   - The patient did not have access to video technology or had technical difficulties with video requiring transitioning to audio format only. - No physical exam could be performed with this format, beyond that communicated to [place] by the patient/ family members as noted.   - Additionally my office staff/ schedulers discussed with the patient that there may be a monetary charge related to this service, depending on their medical insurance.   The patient expressed understanding, and agreed to proceed.       History of Present Illness: I, [name], am serving as scribe for Dr. Mellody Dance.   Patient says that he's been "going through a few things."  - Recent CABG*3 on 10/25/18 ; followed by Cardiology  He had chest pain; "ended up being a triple heart bypass surgery." This was on 10/24/18  Since then, notes "doing well.  I had a blockage."   Denies current CP/sx  He is following up with Dr. [name].  Says he "changed my cholesterol and blood pressure medicines."  Patient notes he is taking his blood pressure medicine three times per day.  Says "I can't exercise because the block that's been there is a generic thing in my family."  Confirms he's doing  cardiac rehab though when asked about it.   - Mood Feels his mood has been "rough" because "I'm a person that's hyper."  Says he "moves and moves a lot and stays on the go."    Notes "getting used to doing nothing" has been difficult.  Says if he didn't have the Prozac he'd probably feel "crazy."  He feels that once he can resume moving and exercising, he will feel better.   - Sleep Says he's "had to sleep a certain way" since his CABG*3, so "it's been rough since."  Notes he's "getting a little better now" in these weeks he's "been able to turn over again."   - Attention Deficit Notes cardiology "didn't say anything about stopping the Adderall."  Per patient, he hasn't taken Adderall since "before I had the blockage."  Says it's been 2-3 months since he filled it, and "I stopped taking it before I had the blockage."   HPI:   Diabetes Mellitus:  Home glucose readings:  140-157, "staying right there."  Notes usually in the 140's-150's.   - Patient reports good compliance with therapy plan: medication and/or lifestyle modification.  He is taking metformin once daily.  - His denies acute concerns or problems related to treatment plan  - He denies new concerns.  Denies polyuria/polydipsia, hypo/ hyperglycemia symptoms.  Denies new onset of: chest pain, exercise intolerance, shortness of breath, dizziness, visual changes, headache, lower extremity swelling or claudication.   Last A1C in the office was:  Lab  Results  Component Value Date   HGBA1C 6.6 (H) 10/26/2018   HGBA1C 6.6 (H) 08/30/2018   HGBA1C 6.6 (A) 02/08/2018   Lab Results  Component Value Date   MICROALBUR 80 12/07/2016   LDLCALC 90 10/25/2018   CREATININE 1.67 (H) 11/15/2018   BP Readings from Last 3 Encounters:  12/05/18 125/72  11/15/18 110/62  11/11/18 113/74   Wt Readings from Last 3 Encounters:  12/05/18 224 lb (101.6 kg)  11/15/18 224 lb (101.6 kg)  11/11/18 222 lb (100.7 kg)     HPI:   Hypertension:  -  His blood pressure at home has been running: 125/79 this morning.  Says it's usually 125/72, "right in that range."  - Patient reports good compliance with medication and/or lifestyle modification  - His denies acute concerns or problems related to treatment plan  - He denies new onset of: chest pain, exercise intolerance, shortness of breath, dizziness, visual changes, headache, lower extremity swelling or claudication.   Last 3 blood pressure readings in our office are as follows: BP Readings from Last 3 Encounters:  12/05/18 125/72  11/15/18 110/62  11/11/18 113/74   Filed Weights   12/05/18 1348  Weight: 224 lb (101.6 kg)     HPI:  Hyperlipidemia:  63 y.o. male here for cholesterol follow-up.   - Patient reports good compliance with treatment plan of:  medication and/ or lifestyle management.    Confirms his management is now being supervised by cardiology.  - Patient denies any acute concerns or problems with management plan   - He denies new onset of: myalgias, arthralgias, increased fatigue more than normal, chest pains, exercise intolerance, shortness of breath, dizziness, visual changes, headache, lower extremity swelling or claudication.   Most recent cholesterol panel was:  Lab Results  Component Value Date   CHOL 158 10/25/2018   HDL 47 10/25/2018   LDLCALC 90 10/25/2018   TRIG 104 10/25/2018   CHOLHDL 3.4 10/25/2018    Hepatic Function Latest Ref Rng & Units 10/24/2018 08/30/2018 02/09/2018  Total Protein 6.5 - 8.1 g/dL 8.2(H) 6.9 7.0  Albumin 3.5 - 5.0 g/dL 4.4 4.3 4.3  AST 15 - 41 U/L 41 25 34  ALT 0 - 44 U/L _0 Alk Phosphatase 38 - 126 U/L 73 86 88  Total Bilirubin 0.3 - 1.2 mg/dL 0.8 0.3 0.3  Bilirubin, Direct 0.0 - 0.2 mg/dL 0.2 - -    GAD 7 : Generalized Anxiety Score 08/09/2018 02/08/2018  Nervous, Anxious, on Edge 0 0  Control/stop worrying 0 0  Worry too much - different things 0 0  Trouble relaxing 0 0  Restless 0 0   Easily annoyed or irritable 0 0  Afraid - awful might happen 0 0  Total GAD 7 Score 0 0  Anxiety Difficulty - Not difficult at all    Depression screen [facility] 2/9 12/05/2018 08/29/2018 08/09/2018 05/10/2018 02/08/2018  Decreased Interest 0 0 0 0 0  Down, Depressed, Hopeless 0 0 0 0 0  PHQ - 2 Score 0 0 0 0 0  Altered sleeping 0 0 0 0 0  Tired, decreased energy 0 0 0 0 0  Change in appetite 0 0 0 0 0  Feeling bad or failure about yourself  0 0 0 0 0  Trouble concentrating 0 0 0 0 0  Moving slowly or fidgety/restless 0 0 0 0 0  Suicidal thoughts 0 0 0 0 0  PHQ-9 Score 0 0 0 0 0  Difficult doing work/chores - - Not difficult at all Not difficult at all Not difficult at all  Some recent data might be hidden      Impression and Recommendations:    1. Hospital discharge follow-up   2. Coronary artery disease involving native coronary artery of native heart with unstable angina pectoris ([place])   3. S/P CABG x 3   4. 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 Hypertension associated with diabetes ([place])   6. Hyperlipidemia associated with type 2 diabetes mellitus ([place])   7. Mood disorder ([place])   8. Stage 3 chronic kidney disease, unspecified whether stage 3a or 3b CKD   9. Vitamin D deficiency   10. Attention deficit hyperactivity disorder (ADHD), predominantly inattentive type   11. Sleep disorder     Recent CABG *3 --Regarding pt's recent hospitalization: reviewed in great detail recent hospitalization notes, clinical lab tests, tests in the radiology section of CPT, tests in the medicine testing of CPT, and obtained history from family member. -Interpretation done by me today at point of care delivery; directly influenced my plan of care for patient. Moderate-Significant complexity  - STRONGLY encouraged patient to do cardiac rehab!! - Pt Asx, knows to follow closely with Cards - meds at this time per cards - Will  continue to monitor alongside cardiology.   Type 2 Diabetes Mellitus w/ other kidney complication - U8K was 6.6 Last check back on 10/26/2018.   At goal.  - Continue current treatment regimen.  See med list.  - Counseled patient on pathophysiology of disease and discussed various treatment options, which always includes dietary and lifestyle modification as first line.    - Importance of low carb, heart-healthy diet discussed with patient in addition to regular aerobic exercise of 72mn 5d/week or more.   - Check FBS and 2 hours after the biggest meal of your day.  Keep log and bring in next OV for my review.     - Also told patient if you ever feel poorly, please check your blood pressure and blood sugar, as one or the other could be the cause of your symptoms.  - Pt reminded about need for yearly eye and foot exams.  Told patient to make appt.for diabetic eye exam, CMAs here will do foot exams  - Handouts provided at patient's desire and or told to go online at the American Diabetes Association website for further information  - We will continue to monitor   Stage 3 Chronic Kidney Disease; Renal Insufficiency - Told patient to follow up with nephrology as scheduled. - Advised patient to follow up with nephrologist sooner rather than later due to recent developments. - Discussed critical importance of monitoring his kidney health closely after his cardiac emergency. - Will continue to monitor.   Hypertension associated with DM - Managed by cardiology after patient's recent CABG *3.  - Blood pressure currently is at goal. - Patient will continue current treatment regimen as advised by cardiology.  - Counseled patient on pathophysiology of disease and discussed various treatment options, which always includes dietary and lifestyle modification as first line.   - Lifestyle changes such as dash and heart healthy diets and engaging in a regular exercise program discussed extensively  with patient.   - Ambulatory blood pressure monitoring encouraged at least 3 times weekly.  Keep log and bring in every office visit.  Reminded patient that if they ever feel poorly in  any way, to check their blood pressure and pulse.  - Handouts provided at patient's desire and/or told to go online at the [employer] website for further information  - We will continue to monitor   Hyperlipidemia associated with DM - Managed by cardiology after patient's recent CABG *3.  - Pt will continue current treatment regimen as advised by cardiology.  Dietary changes such as low saturated & trans fat diets for hyperlipidemia and low carb diets for hypertriglyceridemia discussed with patient.    Encouraged patient to follow AHA guidelines for regular exercise and also engage in weight loss if BMI above 25.   Educational handouts provided at patient's desire and/ or told to look online at the [employer] website for further information.  We will continue to monitor   ADHD - Historically managed on Adderall. - Discussed that resuming this medication will cause patient's heart rate and BP to go up. - Discussed that this medication is risky to use in a patient with history of of recent heart blockages. - Pt understands risks and thinks he can see how he does w/o them at this time - Told patient to discontinue use of Adderall. - Reviewed that given patient's cardiac history, we are uncomfortable refilling this Rx.  - To help manage attention deficit, advised patient to focus on healthy habits, prudent ADD diet and regular exercise is paramount!  - Do ongoing cardiac rehabilitation through cardiology.  - Will continue to monitor.   BMI Counseling - Body mass index is 31.24 kg/m. Explained to patient what BMI refers to, and what it means medically.    Told patient to think about it as a "medical risk stratification measurement" and how increasing BMI is associated  with increasing risk/ or worsening state of various diseases such as hypertension, hyperlipidemia, diabetes, premature OA, depression etc.  American Heart Association guidelines for healthy diet, basically Mediterranean diet, and exercise guidelines of 30 minutes 5 days per week or more discussed in detail.  Health counseling performed.  All questions answered.   Lifestyle & Preventative Health Maintenance - Advised patient to continue cardiac rehab, and eventually working toward exercising to improve overall mental, physical, and emotional health.    - Reviewed the "spokes of the wheel" of mood and health management.  Stressed the importance of ongoing prudent habits, including regular exercise, appropriate sleep hygiene, healthful dietary habits, and prayer/meditation to relax.  - Encouraged patient to engage in daily physical activity as tolerated, especially a formal exercise routine.  Recommended that the patient eventually strive for at least 150 minutes of moderate cardiovascular activity per week according to guidelines established by the [facility].   - Healthy dietary habits encouraged, including low-carb, and high amounts of lean protein in diet.   - Patient should also consume adequate amounts of water.   Recommendations - Return mid-January A1c repeat, urine micro, and OV after.   - As part of my medical decision making, I reviewed the following data within the [place] History obtained from pt /family, CMA notes reviewed and incorporated if applicable, Labs reviewed, Radiograph/ tests reviewed if applicable and OV notes from prior OV's with me, as well as other specialists she/he has seen since seeing me last, were all reviewed and used in my medical decision making process today.    - Additionally, discussion had with patient regarding our treatment plan, and their biases/concerns about that plan were used in my medical decision making today.    -  The patient  agreed with the plan and demonstrated an understanding of the instructions.   No barriers to understanding were identified.    - Red flag symptoms and signs discussed in detail.  Patient expressed understanding regarding what to do in case of emergency_0 urgent symptoms.   - The patient was advised to call back or seek an in-person evaluation if the symptoms worsen or if the condition fails to improve as anticipated.   Return for mid-january in-person visit, check A1c and urine micro, other labs as ordered.     Medications Discontinued During This Encounter  Medication Reason  . amphetamine-dextroamphetamine (ADDERALL) 10 MG tablet       I provided 20+ minutes of non face-to-face time during this encounter.  Additional time was spent with charting and coordination of care after the actual visit commenced.   Note:  This note was prepared with assistance of Dragon voice recognition software. Occasional wrong-word or sound-a-like substitutions may have occurred due to the inherent limitations of voice recognition software.  This document serves as a record of services personally performed by Mellody Dance, DO. It was created on her behalf by [name], a trained medical scribe. The creation of this record is based on the scribe's personal observations and the provider's statements to them.   This case required medical decision making of at least moderate complexity. The above documentation has been reviewed to be accurate and was completed by Marjory Sneddon, D.O.       Patient Care Team    Relationship Specialty Notifications Start End  Mellody Dance, DO PCP - General Family Medicine  10/28/16   Rutherford Guys, MD Consulting Physician Ophthalmology  11/23/14   Jovita Gamma, MD Consulting Physician Neurosurgery  11/23/14   Kathie Rhodes, MD Consulting Physician Urology  12/07/14   Milus Banister, MD Attending Physician Gastroenterology  12/07/14   Chesley Mires, MD  Consulting Physician Pulmonary Disease  12/07/16   Garald Balding, MD Consulting Physician Orthopedic Surgery  12/07/16    Comment: back and hip  Dene Gentry, MD Consulting Physician Sports Medicine  12/07/16    Comment: back and hip pain  Corliss Parish, MD Consulting Physician Nephrology  11/08/17    Comment: Sees her every 6 months  Lajuana Matte, MD Consulting Physician Cardiothoracic Surgery  12/10/18   Burtis Junes, NP Nurse Practitioner Nurse Practitioner  12/10/18    Comment: works with Dr Lauro Regulus, Lynnell Dike, MD Consulting Physician Cardiology  12/10/18      -Vitals obtained; medications/ allergies reconciled;  personal medical, social, Sx etc.histories were updated by CMA, reviewed by me and are reflected in chart   Patient Active Problem List   Diagnosis Date Noted  . Hypertension associated with diabetes ([place]) 12/01/2011    Priority: High  . Diabetes mellitus with renal manifestation ([place]) 06/11/2006    Priority: High  . Hyperlipidemia associated with type 2 diabetes mellitus ([place]) 05/26/2006    Priority: High  . OBSTRUCTIVE SLEEP APNEA 05/26/2006    Priority: High  . Attention deficit hyperactivity disorder (ADHD), predominantly inattentive type 07/26/2017    Priority: Medium  . Overweight (BMI 25.0-29.9) 04/25/2017    Priority: Medium  . Prostatitis 04/20/2014    Priority: Medium  . Mood disorder ([place]) 03/29/2013    Priority: Medium  . Renal insufficiency 12/01/2011    Priority: Medium  . 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 Vitamin D deficiency  11/30/2007    Priority: Low  . Sleep disorder 12/05/2018  . CAD (coronary artery disease) 10/26/2018  . 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 Coronary artery disease involving native coronary artery of native heart with unstable angina pectoris ([place])   . Chest pain 10/24/2018  . Back pain 09/26/2018  . Type 2 diabetes mellitus with renal complication ([place])  00/45/9977  . Diabetes mellitus ([place]) 02/08/2018  . Chronic kidney disease (CKD), stage III (moderate) 02/08/2018  . Tinea corporis 11/08/2017  . Chronic left hip pain 07/26/2017  . Facet arthropathy, lumbosacral 07/26/2017  . DDD (degenerative disc disease), lumbosacral 07/26/2017  . 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 Noncompliance 03/24/2017  . Abdominal bloating 05/15/2015  . UTI (urinary tract infection) 04/20/2014  . Hyperglycemia 04/20/2014  . Fatigue 03/07/2014  . Family history of early CAD 03/07/2014  . Maxillary sinusitis, acute 12/06/2013  . Tinea versicolor 12/06/2013  . Physical exam 11/06/2013  . Other malaise and fatigue 03/29/2013  . Decreased libido 03/29/2013  . Hematuria 02/17/2013  . Right elbow pain 12/15/2012  . Pustular folliculitis 41/42/3953  . Left ankle pain 03/15/2012  . SPINAL STENOSIS, LUMBAR 08/02/2008  . GOUT 11/30/2007  . BPH (benign prostatic hyperplasia) 11/30/2007  . ANEMIA NEC 06/11/2006  . Chronic back pain greater than 3 months duration 06/11/2006     Current Meds  Medication Sig  . acetaminophen (TYLENOL) 500 MG tablet Take 2 tablets (1,000 mg total) by mouth every 6 (six) hours as needed.  Marland Kitchen allopurinol (ZYLOPRIM) 300 MG tablet TAKE 1/2 TABLET BY MOUTH DAILY  . Ascorbic Acid (VITAMIN C) 1000 MG tablet Take 1,000 mg by mouth daily.  Marland Kitchen aspirin 81 MG tablet Take 81 mg by mouth daily.    Marland Kitchen atorvastatin (LIPITOR) 80 MG tablet Take 1 tablet (80 mg total) by mouth at bedtime.  . blood glucose meter kit and supplies Dispense based on patient and insurance preference. Use to check fasting blood glucose in the morning and to check glucose 2 hours after largest meal of the day. (FOR ICD-10 E10.9, E11.9).  Marland Kitchen FLUoxetine (PROZAC) 40 MG capsule TAKE 1 CAPSULE BY MOUTH DAILY  . gabapentin (NEURONTIN) 800 MG tablet Take 1 tablet (800 mg total) by mouth 3 (three) times daily.  Marland Kitchen  glucose blood test strip Use as instructed  . LANCETS ULTRA FINE MISC Use to check fasting blood glucose in the morning and to check glucose 2 hours after largest meal of the day  . metFORMIN (GLUCOPHAGE-XR) 500 MG 24 hr tablet TAKE 1 TABLET BY MOUTH DAILY WITH BREAKFAST  . metoprolol tartrate (LOPRESSOR) 25 MG tablet Take 1 tablet (25 mg total) by mouth 3 (three) times daily.  . Multiple Vitamins-Iron (MULTIVITAMINS WITH IRON) TABS Take 1 tablet by mouth daily.  . pantoprazole (PROTONIX) 40 MG tablet TAKE 1 TABLET BY MOUTH DAILY  . polyethylene glycol powder (GLYCOLAX/MIRALAX) powder USE AS DIRECTED  . pyridoxine (B-6) 100 MG tablet Take 100 mg by mouth daily.  . traMADol (ULTRAM) 50 MG tablet Take 1-2 tablets (50-100 mg total) by mouth every 4 (four) hours as needed for moderate pain.  . traZODone (DESYREL) 50 MG tablet TAKE 1/2 TO 1 TABLET BY MOUTH AT BEDTIMEAS NEEDED FOR SLEEP  . Vitamin D, Ergocalciferol, (DRISDOL) 1.25 MG (50000 UT) CAPS capsule TAKE 1 CAPSULE BY MOUTH EVERY 7 DAYS  . [DISCONTINUED] amphetamine-dextroamphetamine (ADDERALL) 10 MG tablet Take 1 tablet (10 mg total) by  mouth daily with breakfast.     Allergies:  Allergies  Allergen Reactions  . Prednisone Other (See Comments)    Caused blood sugar to increase, pt will not take     ROS:  See above HPI for pertinent positives and negatives   Objective:   Blood pressure 125/72, height 5' 11" (1.803 m), weight 224 lb (101.6 kg).  (if some vitals are omitted, this means that patient was UNABLE to obtain them even though they were asked to get them prior to OV today.  They were asked to call us at their earliest convenience with these once obtained. )  General: A & O * 3; sounds in no acute distress; in usual state of health.  Skin: Pt confirms warm and dry extremities and pink fingertips HEENT: Pt confirms lips non-cyanotic Chest: Patient confirms normal chest excursion and movement Respiratory: speaking in full  sentences, no conversational dyspnea; patient confirms no use of accessory muscles Psych: insight appears good, mood- appears full

## 2018-12-07 ENCOUNTER — Telehealth (HOSPITAL_COMMUNITY): Payer: Self-pay | Admitting: Pharmacist

## 2018-12-07 NOTE — Telephone Encounter (Signed)
Cardiac Rehab Medication Review by a Pharmacist  Does the patient  feel that his/her medications are working for him/her?  yes  Has the patient been experiencing any side effects to the medications prescribed?  no  Does the patient measure his/her own blood pressure or blood glucose at home?  Yes - Blood pressure this morning was 131/82 mmHg and blood glucose was 146 this morning  Does the patient have any problems obtaining medications due to transportation or finances?   no  Understanding of regimen: good Understanding of indications: good Potential of compliance: good  Richardine Service, PharmD PGY1 Pharmacy Resident Phone: [phone] 12/07/2018  10:18 AM

## 2018-12-12 ENCOUNTER — Telehealth (HOSPITAL_COMMUNITY): Payer: Self-pay | Admitting: *Deleted

## 2018-12-12 NOTE — Progress Notes (Signed)
Cardiology Office Note:    Date:  12/14/2018   ID:  [name], DOB [birth year], MRN [id number]  PCP:  Mellody Dance, DO  Cardiologist:  Sinclair Grooms, MD   Referring MD: Mellody Dance, DO   Chief Complaint  Patient presents with  . Coronary Artery Disease    History of Present Illness:    [name] is a 63 y.o. male with a hx of CABG for severe LM disease ( LIMA to LAD, SVG to DX1 and OM 10/26/18), DM, HTN, OSA, GERD and HLD.  He is doing well.  He has some concerns about heart rates which have been causing his Apple Watch to alarm when heart rate goes less than 50.  This was also mentioned by cardiac rehab.  He denies angina.  He has not had orthopnea, PND, or significant dyspnea.  His current medical regimen is metoprolol 25 mg 3 times daily which is contributing to the bradycardia.  This adjustment will be to decrease dosing to twice daily.    Past Medical History:  Diagnosis Date  . Carpal tunnel syndrome, bilateral   . Carpal tunnel syndrome, right    nerve impingement right arm/wears brace  . Coronary artery disease   . Diabetes mellitus    no meds  . Dyspnea   . GERD (gastroesophageal reflux disease)   . Gout   . Hepatic steatosis   . Hyperlipidemia   . Hypertension   . Obstructive sleep apnea     Past Surgical History:  Procedure Laterality Date  . CARDIAC CATHETERIZATION    . CARPAL TUNNEL RELEASE  09/10/2015   RIGHT WRIST / WITH NERVE IMPINGEMENT SURGERY  . CERVICAL FUSION     Dr Sherwood Gambler  . COLONOSCOPY  2007   negative; [facility]  . CORONARY ANGIOGRAPHY N/A 10/25/2018   Procedure: CORONARY ANGIOGRAPHY (CATH LAB);  Surgeon: Sherren Mocha, MD;  Location: [facility];  Service: Cardiovascular;  Laterality: N/A;  . CORONARY ARTERY BYPASS GRAFT N/A 10/26/2018   Procedure: CORONARY ARTERY BYPASS GRAFTING (CABG) x3 using the LIMA to LAD and right greater saphenous vein via endoscopic harvest to the Diag 1 and OM 2.;  Surgeon:  Lajuana Matte, MD;  Location: [place];  Service: Open Heart Surgery;  Laterality: N/A;  . ESOPHAGEAL DILATION  2007  . 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 LUMBAR FUSION      Dr Sherwood Gambler    Current Medications: Current Meds  Medication Sig  . acetaminophen (TYLENOL) 500 MG tablet Take 2 tablets (1,000 mg total) by mouth every 6 (six) hours as needed.  Marland Kitchen allopurinol (ZYLOPRIM) 300 MG tablet TAKE 1/2 TABLET BY MOUTH DAILY  . Ascorbic Acid (VITAMIN C) 1000 MG tablet Take 1,000 mg by mouth daily.  Marland Kitchen aspirin 81 MG tablet Take 81 mg by mouth daily.    Marland Kitchen atorvastatin (LIPITOR) 80 MG tablet Take 1 tablet (80 mg total) by mouth at bedtime.  . blood glucose meter kit and supplies Dispense based on patient and insurance preference. Use to check fasting blood glucose in the morning and to check glucose 2 hours after largest meal of the day. (FOR ICD-10 E10.9, E11.9).  Marland Kitchen FLUoxetine (PROZAC) 40 MG capsule TAKE 1 CAPSULE BY MOUTH DAILY  . gabapentin (NEURONTIN) 800 MG tablet Take 1 tablet (800 mg total) by mouth 3 (three) times daily.  Marland Kitchen glucose blood test strip Use as instructed  . LANCETS ULTRA FINE MISC  Use to check fasting blood glucose in the morning and to check glucose 2 hours after largest meal of the day  . metFORMIN (GLUCOPHAGE-XR) 500 MG 24 hr tablet TAKE 1 TABLET BY MOUTH DAILY WITH BREAKFAST  . metoprolol tartrate (LOPRESSOR) 25 MG tablet Take 1 tablet (25 mg total) by mouth 2 (two) times daily.  . Multiple Vitamins-Iron (MULTIVITAMINS WITH IRON) TABS Take 1 tablet by mouth daily.  . pantoprazole (PROTONIX) 40 MG tablet TAKE 1 TABLET BY MOUTH DAILY  . polyethylene glycol powder (GLYCOLAX/MIRALAX) powder USE AS DIRECTED  . pyridoxine (B-6) 100 MG tablet Take 100 mg by mouth daily.  . traMADol (ULTRAM) 50 MG tablet Take 1-2 tablets (50-100 mg total) by mouth every 4 (four) hours as needed for moderate pain.  . traZODone (DESYREL) 50 MG tablet TAKE 1/2  TO 1 TABLET BY MOUTH AT BEDTIMEAS NEEDED FOR SLEEP  . Vitamin D, Ergocalciferol, (DRISDOL) 1.25 MG (50000 UT) CAPS capsule TAKE 1 CAPSULE BY MOUTH EVERY 7 DAYS  . [DISCONTINUED] metoprolol tartrate (LOPRESSOR) 25 MG tablet Take 1 tablet (25 mg total) by mouth 3 (three) times daily.     Allergies:   Prednisone   Social History   Socioeconomic History  . Marital status: Married    Spouse name: Not on file  . Number of children: Not on file  . Years of education: Not on file  . Highest education level: Not on file  Occupational History  . Occupation: Disabled  Social Needs  . Financial resource strain: Not on file  . Food insecurity    Worry: Not on file    Inability: Not on file  . Transportation needs    Medical: No    Non-medical: No  Tobacco Use  . Smoking status: Former Smoker    Quit date: 01/06/1976    Years since quitting: 42.9  . Smokeless tobacco: Former Systems developer  . Tobacco comment: smoked 35 years ago as of 2013   Substance and Sexual Activity  . Alcohol use: No  . Drug use: No  . Sexual activity: Yes    Birth control/protection: None  Lifestyle  . Physical activity    Days per week: 0 days    Minutes per session: 0 min  . Stress: Only a little  Relationships  . Social Herbalist on phone: Not on file    Gets together: Not on file    Attends religious service: Not on file    Active member of club or organization: Not on file    Attends meetings of clubs or organizations: Not on file    Relationship status: Not on file  Other Topics Concern  . Not on file  Social History Narrative  . Not on file     Family History: The patient's family history includes Aneurysm (age of onset: 33) in his mother; Cancer in his maternal uncle and paternal uncle; Diabetes in his sister; Heart attack in his father; Heart disease in his father, sister, and sister; Kidney disease in his brother; Sudden death in his mother; Urolithiasis in his father. There is no history of  Hyperlipidemia, Hypertension, or Colon cancer.  ROS:   Please see the history of present illness.    He notes that there is an underlying issue with kidney function.  He is followed by nephrology.  All other systems reviewed and are negative.  EKGs/Labs/Other Studies Reviewed:    The following studies were reviewed today: No new data.  Labs will be  drawn today.  EKG:  EKG not repeated  Recent Labs: 02/09/2018: TSH 3.370 10/24/2018: ALT 28 10/27/2018: Magnesium 2.2 11/15/2018: BUN 33; Creatinine, Ser 1.67; Hemoglobin 9.4; Platelets 328; Potassium 5.1; Sodium 139  Recent Lipid Panel    Component Value Date/Time   CHOL 158 10/25/2018 0320   CHOL 128 02/09/2018 0859   TRIG 104 10/25/2018 0320   HDL 47 10/25/2018 0320   HDL 43 02/09/2018 0859   CHOLHDL 3.4 10/25/2018 0320   VLDL 21 10/25/2018 0320   LDLCALC 90 10/25/2018 0320   LDLCALC 72 02/09/2018 0859    Physical Exam:    VS:  BP 138/82   Pulse (!) 53   Ht '5\' 11"'  (1.803 m)   Wt 229 lb 1.9 oz (103.9 kg)   SpO2 99%   BMI 31.96 kg/m     Wt Readings from Last 3 Encounters:  12/14/18 229 lb 1.9 oz (103.9 kg)  12/13/18 232 lb 5.8 oz (105.4 kg)  12/05/18 224 lb (101.6 kg)     GEN: Obese. No acute distress HEENT: Normal NECK: No JVD. LYMPHATICS: No lymphadenopathy CARDIAC:  RRR without murmur, gallop, or edema. VASCULAR:  Normal Pulses. No bruits. RESPIRATORY:  Clear to auscultation without rales, wheezing or rhonchi  ABDOMEN: Soft, non-tender, non-distended, No pulsatile mass, MUSCULOSKELETAL: No deformity  SKIN: Warm and dry NEUROLOGIC:  Alert and oriented x 3 PSYCHIATRIC:  Normal affect   ASSESSMENT:    1. S/P CABG (coronary artery bypass graft)   2. Essential hypertension   3. Renal insufficiency   4. Pure hypercholesterolemia   5. Educated about COVID-19 virus infection    PLAN:    In order of problems listed above:  1. Secondary prevention discussed in detail. 2. Blood pressure is well controlled at  cardiac rehab.  It is a little bit elevated today.  Target 130/80 mmHg.  Metoprolol has been cut back to 25 mg twice daily.  If blood pressure increases, consider adding low-dose ACE or ARB and watching kidney function closely versus amlodipine. 3. Renal function will be reassessed today 4. LDL target 70 or less.  A lipid panel is obtained today.  Most recent was 66. 5. 3W's is endorsed to avoid COVID-19 infection.   Medication Adjustments/Labs and Tests Ordered: Current medicines are reviewed at length with the patient today.  Concerns regarding medicines are outlined above.  Orders Placed This Encounter  Procedures  . Hepatic function panel  . Lipid panel  . Basic metabolic panel  . CBC   Meds ordered this encounter  Medications  . metoprolol tartrate (LOPRESSOR) 25 MG tablet    Sig: Take 1 tablet (25 mg total) by mouth 2 (two) times daily.    Dispense:  180 tablet    Refill:  3    Dose change    Patient Instructions  Medication Instructions:  1) DECREASE Metoprolol to 89m twice daily.  Your goal blood pressure is 130/80 or less.   *If you need a refill on your cardiac medications before your next appointment, please call your pharmacy*  Lab Work: BMET, Lipid, Liver and CBC today  If you have labs (blood work) drawn today and your tests are completely normal, you will receive your results only by: .Marland KitchenMyChart Message (if you have MyChart) OR . A paper copy in the mail If you have any lab test that is abnormal or we need to change your treatment, we will call you to review the results.  Testing/Procedures: None  Follow-Up: At [facility]  you and your health needs are our priority.  As part of our continuing mission to provide you with exceptional heart care, we have created designated Provider Care Teams.  These Care Teams include your primary Cardiologist (physician) and Advanced Practice Providers (APPs -  Physician Assistants and Nurse Practitioners) who all work  together to provide you with the care you need, when you need it.  Your next appointment:   3-4 month(s)  The format for your next appointment:   In Person  Provider:   You may see Sinclair Grooms, MD or one of the following Advanced Practice Providers on your designated Care Team:    Truitt Merle, NP  Cecilie Kicks, NP  Kathyrn Drown, NP   Other Instructions      Signed, Sinclair Grooms, MD  12/14/2018 4:09 PM    [place]

## 2018-12-12 NOTE — Telephone Encounter (Signed)
Spoke with the patient. Confirmed orientation appointment for tomorrow. Completed health history over the phone.Barnet Pall, RN,BSN 12/12/2018 4:15 PM

## 2018-12-13 ENCOUNTER — Other Ambulatory Visit: Payer: Self-pay

## 2018-12-13 ENCOUNTER — Encounter (HOSPITAL_COMMUNITY)
Admission: RE | Admit: 2018-12-13 | Discharge: 2018-12-13 | Disposition: A | Discharge status: Home / Self Care | Payer: PPO | Source: Ambulatory Visit | Attending: Interventional Cardiology | Admitting: Interventional Cardiology

## 2018-12-13 ENCOUNTER — Encounter (HOSPITAL_COMMUNITY): Payer: Self-pay

## 2018-12-13 VITALS — Ht 71.0 in | Wt 232.4 lb

## 2018-12-13 DIAGNOSIS — G4733 Obstructive sleep apnea (adult) (pediatric): Secondary | ICD-10-CM | POA: Insufficient documentation

## 2018-12-13 DIAGNOSIS — E119 Type 2 diabetes mellitus without complications: Secondary | ICD-10-CM | POA: Insufficient documentation

## 2018-12-13 DIAGNOSIS — I2510 Atherosclerotic heart disease of native coronary artery without angina pectoris: Secondary | ICD-10-CM | POA: Insufficient documentation

## 2018-12-13 DIAGNOSIS — Z79899 Other long term (current) drug therapy: Secondary | ICD-10-CM | POA: Insufficient documentation

## 2018-12-13 DIAGNOSIS — Z951 Presence of aortocoronary bypass graft: Secondary | ICD-10-CM | POA: Insufficient documentation

## 2018-12-13 DIAGNOSIS — Z7982 Long term (current) use of aspirin: Secondary | ICD-10-CM | POA: Insufficient documentation

## 2018-12-13 DIAGNOSIS — E785 Hyperlipidemia, unspecified: Secondary | ICD-10-CM | POA: Insufficient documentation

## 2018-12-13 DIAGNOSIS — I10 Essential (primary) hypertension: Secondary | ICD-10-CM | POA: Insufficient documentation

## 2018-12-13 HISTORY — DX: Atherosclerotic heart disease of native coronary artery without angina pectoris: I25.10

## 2018-12-13 NOTE — Progress Notes (Addendum)
Cardiac Individual Treatment Plan  Patient Details  Name: [name] MRN: [id number] Date of Birth: 21-Mar-1955 Referring Provider:     [facility] from 12/13/2018 in [place]  Referring Provider  Dr. 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  acetaminophen (TYLENOL) 500 MG tablet, Take 2 tablets (1,000 mg total) by mouth every 6 (six) hours as needed., Disp: 30 tablet, Rfl: 0 .  allopurinol (ZYLOPRIM) 300 MG tablet, TAKE 1/2 TABLET BY MOUTH DAILY, Disp: 45 tablet, Rfl: 0 .  Ascorbic Acid (VITAMIN C) 1000 MG tablet, Take 1,000 mg by mouth daily., Disp: , Rfl:  .  aspirin 81 MG tablet, Take 81 mg by mouth daily.  , Disp: , Rfl:  .  atorvastatin (LIPITOR) 80 MG tablet, Take 1 tablet (80 mg total) by mouth at bedtime., Disp: 30 tablet, Rfl: 3 .  blood glucose meter kit and supplies, Dispense based on patient and insurance preference. Use to check fasting blood glucose in the morning and to check glucose 2 hours after largest meal of the day. (FOR ICD-10 E10.9, E11.9)., Disp: 1 each, Rfl: 0 .  FLUoxetine (PROZAC) 40 MG capsule, TAKE 1 CAPSULE BY MOUTH DAILY, Disp: 90 capsule, Rfl: 1 .  gabapentin (NEURONTIN) 800 MG tablet, Take 1 tablet (800 mg total) by mouth 3 (three) times daily., Disp: 270 tablet, Rfl: 1 .  glucose blood test strip, Use as instructed, Disp: 100 each, Rfl: 12 .  LANCETS ULTRA FINE MISC, Use to check fasting blood glucose in the morning and to check glucose 2 hours after largest meal of the day, Disp: 100 each, Rfl: 12 .  metFORMIN (GLUCOPHAGE-XR) 500 MG 24 hr tablet, TAKE 1 TABLET BY MOUTH DAILY WITH BREAKFAST, Disp: 90 tablet, Rfl: 1 .  metoprolol tartrate (LOPRESSOR) 25 MG tablet,  Take 1 tablet (25 mg total) by mouth 3 (three) times daily., Disp: 90 tablet, Rfl: 3 .  Multiple Vitamins-Iron (MULTIVITAMINS WITH IRON) TABS, Take 1 tablet by mouth daily., Disp: 30 tablet, Rfl: 0 .  pantoprazole (PROTONIX) 40 MG tablet, TAKE 1 TABLET BY MOUTH DAILY, Disp: 30 tablet, Rfl: 0 .  polyethylene glycol powder (GLYCOLAX/MIRALAX) powder, USE AS DIRECTED, Disp: 850 g, Rfl: 11 .  pyridoxine (B-6) 100 MG tablet, Take 100 mg by mouth daily., Disp: , Rfl:  .  traMADol (ULTRAM) 50 MG tablet, Take 1-2 tablets (50-100 mg total) by mouth every 4 (four) hours as needed for moderate pain., Disp: 30 tablet, Rfl: 0 .  traZODone (DESYREL) 50 MG tablet, TAKE 1/2 TO 1 TABLET BY MOUTH AT BEDTIMEAS NEEDED FOR SLEEP, Disp: 120 tablet, Rfl: 1 .  Vitamin D, Ergocalciferol, (DRISDOL) 1.25 MG (50000 UT) CAPS capsule, TAKE 1 CAPSULE BY MOUTH EVERY 7 DAYS, Disp: 12 capsule, Rfl: 10  Past Medical History: Past Medical History:  Diagnosis Date  . Carpal tunnel syndrome, bilateral   . Carpal tunnel syndrome, right    nerve impingement right arm/wears brace  . Coronary artery disease   . Diabetes mellitus    no meds  . Dyspnea   . GERD (gastroesophageal reflux disease)   . Gout   . Hepatic steatosis   . Hyperlipidemia   . Hypertension   .  Obstructive sleep apnea     Tobacco Use: Social History   Tobacco Use  Smoking Status Former Smoker  . Quit date: 01/06/1976  . Years since quitting: 42.9  Smokeless Tobacco Former Systems developer  Tobacco Comment   smoked 35 years ago as of 2013     Labs: Recent Chemical engineer    Labs for ITP Cardiac and Pulmonary Rehab Latest Ref Rng & Units 10/26/2018 10/26/2018 10/26/2018 10/26/2018 10/26/2018   Cholestrol 0 - 200 mg/dL - - - - -   LDLCALC 0 - 99 mg/dL - - - - -   HDL >40 mg/dL - - - - -   Trlycerides <150 mg/dL - - - - -   Hemoglobin A1c 4.8 - 5.6 % - - - - -   PHART 7.350 - 7.450 - 7.341(L) 7.307(L) 7.300(L) 7.328(L)   PCO2ART 32.0 - 48.0 mmHg - 41.6  47.6 49.6(H) 45.8   HCO3 20.0 - 28.0 mmol/L - 22.5 24.1 24.4 24.1   TCO2 22 - 32 mmol/L '23 24 26 26 25   ' ACIDBASEDEF 0.0 - 2.0 mmol/L - 3.0(H) 3.0(H) 2.0 2.0   O2SAT % - 99.0 97.0 98.0 99.0      Capillary Blood Glucose: Lab Results  Component Value Date   GLUCAP 100 (H) 10/31/2018   GLUCAP 190 (H) 10/30/2018   GLUCAP 149 (H) 10/30/2018   GLUCAP 172 (H) 10/30/2018   GLUCAP 72 10/30/2018     Exercise Target Goals: Exercise Program Goal: Individual exercise prescription set using results from initial 6 min walk test and THRR while considering  patient's activity barriers and safety.   Exercise Prescription Goal: Initial exercise prescription builds to 30-45 minutes a day of aerobic activity, 2-3 days per week.  Home exercise guidelines will be given to patient during program as part of exercise prescription that the participant will acknowledge.  Activity Barriers & Risk Stratification: Activity Barriers & Cardiac Risk Stratification - 12/13/18 1023      Activity Barriers & Cardiac Risk Stratification   Activity Barriers  Back Problems    Cardiac Risk Stratification  High       6 Minute Walk: 6 Minute Walk    Row Name 12/13/18 1023         6 Minute Walk   Phase  Initial     Distance  1594 feet     Walk Time  6 minutes     # of Rest Breaks  0     MPH  3     METS  3.2     RPE  10     Perceived Dyspnea   0     VO2 Peak  11.4     Symptoms  No     Resting HR  59 bpm     Resting BP  122/72     Resting Oxygen Saturation   98 %     Exercise Oxygen Saturation  during 6 min walk  99 %     Max Ex. HR  81 bpm     Max Ex. BP  124/70     2 Minute Post BP  122/72        Oxygen Initial Assessment:   Oxygen Re-Evaluation:   Oxygen Discharge (Final Oxygen Re-Evaluation):   Initial Exercise Prescription: Initial Exercise Prescription - 12/13/18 1000      Date of Initial Exercise RX and Referring Provider   Date  12/13/18    Referring Provider  Dr. Tamala Julian  Expected Discharge Date  02/10/19      Treadmill   MPH  2.8    Grade  1    Minutes  15      NuStep   Level  3    SPM  85    Minutes  15    METs  3      Prescription Details   Frequency (times per week)  3    Duration  Progress to 30 minutes of continuous aerobic without signs/symptoms of physical distress      Intensity   THRR 40-80% of Max Heartrate  63-126    Ratings of Perceived Exertion  11-13      Progression   Progression  Continue to progress workloads to maintain intensity without signs/symptoms of physical distress.      Resistance Training   Training Prescription  Yes    Weight  4 lbs.     Reps  10-15       Perform Capillary Blood Glucose checks as needed.  Exercise Prescription Changes:   Exercise Comments:   Exercise Goals and Review: Exercise Goals    Row Name 12/13/18 1026             Exercise Goals   Increase Physical Activity  Yes       Intervention  Provide advice, education, support and counseling about physical activity/exercise needs.;Develop an individualized exercise prescription for aerobic and resistive training based on initial evaluation findings, risk stratification, comorbidities and participant's personal goals.       Expected Outcomes  Short Term: Attend rehab on a regular basis to increase amount of physical activity.;Long Term: Add in home exercise to make exercise part of routine and to increase amount of physical activity.;Long Term: Exercising regularly at least 3-5 days a week.       Increase Strength and Stamina  Yes       Intervention  Provide advice, education, support and counseling about physical activity/exercise needs.;Develop an individualized exercise prescription for aerobic and resistive training based on initial evaluation findings, risk stratification, comorbidities and participant's personal goals.       Expected Outcomes  Short Term: Increase workloads from initial exercise prescription for resistance, speed, and  METs.;Short Term: Perform resistance training exercises routinely during rehab and add in resistance training at home;Long Term: Improve cardiorespiratory fitness, muscular endurance and strength as measured by increased METs and functional capacity (6MWT)       Able to understand and use rate of perceived exertion (RPE) scale  Yes       Intervention  Provide education and explanation on how to use RPE scale       Expected Outcomes  Short Term: Able to use RPE daily in rehab to express subjective intensity level;Long Term:  Able to use RPE to guide intensity level when exercising independently       Knowledge and understanding of Target Heart Rate Range (THRR)  Yes       Intervention  Provide education and explanation of THRR including how the numbers were predicted and where they are located for reference       Expected Outcomes  Short Term: Able to state/look up THRR;Long Term: Able to use THRR to govern intensity when exercising independently;Short Term: Able to use daily as guideline for intensity in rehab       Able to check pulse independently  Yes       Intervention  Provide education and demonstration on how to check pulse in  carotid and radial arteries.;Review the importance of being able to check your own pulse for safety during independent exercise       Expected Outcomes  Short Term: Able to explain why pulse checking is important during independent exercise;Long Term: Able to check pulse independently and accurately       Understanding of Exercise Prescription  Yes       Intervention  Provide education, explanation, and written materials on patient's individual exercise prescription       Expected Outcomes  Short Term: Able to explain program exercise prescription;Long Term: Able to explain home exercise prescription to exercise independently          Exercise Goals Re-Evaluation :   Discharge Exercise Prescription (Final Exercise Prescription Changes):   Nutrition:  Target Goals:  Understanding of nutrition guidelines, daily intake of sodium <1532m, cholesterol <2054m calories 30% from fat and 7% or less from saturated fats, daily to have 5 or more servings of fruits and vegetables.  Biometrics: Pre Biometrics - 12/13/18 1027      Pre Biometrics   Height  '5\' 11"'  (1.803 m)    Weight  105.4 kg    Waist Circumference  44 inches    Hip Circumference  44 inches    Waist to Hip Ratio  1 %    BMI (Calculated)  32.42    Triceps Skinfold  22 mm    % Body Fat  32.3 %    Grip Strength  33.5 kg    Flexibility  13 in    Single Leg Stand  22 seconds        Nutrition Therapy Plan and Nutrition Goals:   Nutrition Assessments:   Nutrition Goals Re-Evaluation:   Nutrition Goals Re-Evaluation:   Nutrition Goals Discharge (Final Nutrition Goals Re-Evaluation):   Psychosocial: Target Goals: Acknowledge presence or absence of significant depression and/or stress, maximize coping skills, provide positive support system. Participant is able to verbalize types and ability to use techniques and skills needed for reducing stress and depression.  Initial Review & Psychosocial Screening: Initial Psych Review & Screening - 12/13/18 0838      Initial Review   Current issues with  None Identified      Family Dynamics   Good Support System?  Yes   WiDiontaas his wife for support     Barriers   Psychosocial barriers to participate in program  There are no identifiable barriers or psychosocial needs.      Screening Interventions   Interventions  Encouraged to exercise       Quality of Life Scores: Quality of Life - 12/13/18 1027      Quality of Life   Select  Quality of Life      Quality of Life Scores   Health/Function Pre  26.4 %    Socioeconomic Pre  23.58 %    Psych/Spiritual Pre  29.14 %    Family Pre  19.13 %    GLOBAL Pre  25.56 %      Scores of 19 and below usually indicate a poorer quality of life in these areas.  A difference of  2-3 points is  a clinically meaningful difference.  A difference of 2-3 points in the total score of the Quality of Life Index has been associated with significant improvement in overall quality of life, self-image, physical symptoms, and general health in studies assessing change in quality of life.  PHQ-9: Recent Review Flowsheet Data    Depression screen PHQ  2/9 12/13/2018 12/05/2018 08/29/2018 08/09/2018 05/10/2018   Decreased Interest 0 0 0 0 0   Down, Depressed, Hopeless 0 0 0 0 0   PHQ - 2 Score 0 0 0 0 0   Altered sleeping - 0 0 0 0   Tired, decreased energy - 0 0 0 0   Change in appetite - 0 0 0 0   Feeling bad or failure about yourself  - 0 0 0 0   Trouble concentrating - 0 0 0 0   Moving slowly or fidgety/restless - 0 0 0 0   Suicidal thoughts - 0 0 0 0   PHQ-9 Score - 0 0 0 0   Difficult doing work/chores - - - Not difficult at all Not difficult at all     Interpretation of Total Score  Total Score Depression Severity:  1-4 = Minimal depression, 5-9 = Mild depression, 10-14 = Moderate depression, 15-19 = Moderately severe depression, 20-27 = Severe depression   Psychosocial Evaluation and Intervention:   Psychosocial Re-Evaluation:   Psychosocial Discharge (Final Psychosocial Re-Evaluation):   Vocational Rehabilitation: Provide vocational rehab assistance to qualifying candidates.   Vocational Rehab Evaluation & Intervention: Vocational Rehab - 12/13/18 0839      Initial Vocational Rehab Evaluation & Intervention   Assessment shows need for Vocational Rehabilitation  No   Reyaansh is on disability and does not need vocaitonal rehab at this time      Education: Education Goals: Education classes will be provided on a weekly basis, covering required topics. Participant will state understanding/return demonstration of topics presented.  Learning Barriers/Preferences: Learning Barriers/Preferences - 12/13/18 1028      Learning Barriers/Preferences   Learning Barriers   Sight;Hearing    Learning Preferences  Skilled Demonstration;Pictoral;Video       Education Topics: Count Your Pulse:  -Group instruction provided by verbal instruction, demonstration, patient participation and written materials to support subject.  Instructors address importance of being able to find your pulse and how to count your pulse when at home without a heart monitor.  Patients get hands on experience counting their pulse with staff help and individually.   Heart Attack, Angina, and Risk Factor Modification:  -Group instruction provided by verbal instruction, video, and written materials to support subject.  Instructors address signs and symptoms of angina and heart attacks.    Also discuss risk factors for heart disease and how to make changes to improve heart health risk factors.   Functional Fitness:  -Group instruction provided by verbal instruction, demonstration, patient participation, and written materials to support subject.  Instructors address safety measures for doing things around the house.  Discuss how to get up and down off the floor, how to pick things up properly, how to safely get out of a chair without assistance, and balance training.   Meditation and Mindfulness:  -Group instruction provided by verbal instruction, patient participation, and written materials to support subject.  Instructor addresses importance of mindfulness and meditation practice to help reduce stress and improve awareness.  Instructor also leads participants through a meditation exercise.    Stretching for Flexibility and Mobility:  -Group instruction provided by verbal instruction, patient participation, and written materials to support subject.  Instructors lead participants through series of stretches that are designed to increase flexibility thus improving mobility.  These stretches are additional exercise for major muscle groups that are typically performed during regular warm up and cool  down.   Hands Only CPR:  -Group verbal, video, and  participation provides a basic overview of AHA guidelines for community CPR. Role-play of emergencies allow participants the opportunity to practice calling for help and chest compression technique with discussion of AED use.   Hypertension: -Group verbal and written instruction that provides a basic overview of hypertension including the most recent diagnostic guidelines, risk factor reduction with self-care instructions and medication management.    Nutrition I class: Heart Healthy Eating:  -Group instruction provided by PowerPoint slides, verbal discussion, and written materials to support subject matter. The instructor gives an explanation and review of the Therapeutic Lifestyle Changes diet recommendations, which includes a discussion on lipid goals, dietary fat, sodium, fiber, plant stanol/sterol esters, sugar, and the components of a well-balanced, healthy diet.   Nutrition II class: Lifestyle Skills:  -Group instruction provided by PowerPoint slides, verbal discussion, and written materials to support subject matter. The instructor gives an explanation and review of label reading, grocery shopping for heart health, heart healthy recipe modifications, and ways to make healthier choices when eating out.   Diabetes Question & Answer:  -Group instruction provided by PowerPoint slides, verbal discussion, and written materials to support subject matter. The instructor gives an explanation and review of diabetes co-morbidities, pre- and post-prandial blood glucose goals, pre-exercise blood glucose goals, signs, symptoms, and treatment of hypoglycemia and hyperglycemia, and foot care basics.   Diabetes Blitz:  -Group instruction provided by PowerPoint slides, verbal discussion, and written materials to support subject matter. The instructor gives an explanation and review of the physiology behind type 1 and type 2 diabetes, diabetes  medications and rational behind using different medications, pre- and post-prandial blood glucose recommendations and Hemoglobin A1c goals, diabetes diet, and exercise including blood glucose guidelines for exercising safely.    Portion Distortion:  -Group instruction provided by PowerPoint slides, verbal discussion, written materials, and food models to support subject matter. The instructor gives an explanation of serving size versus portion size, changes in portions sizes over the last 20 years, and what consists of a serving from each food group.   Stress Management:  -Group instruction provided by verbal instruction, video, and written materials to support subject matter.  Instructors review role of stress in heart disease and how to cope with stress positively.     Exercising on Your Own:  -Group instruction provided by verbal instruction, power point, and written materials to support subject.  Instructors discuss benefits of exercise, components of exercise, frequency and intensity of exercise, and end points for exercise.  Also discuss use of nitroglycerin and activating EMS.  Review options of places to exercise outside of rehab.  Review guidelines for sex with heart disease.   Cardiac Drugs I:  -Group instruction provided by verbal instruction and written materials to support subject.  Instructor reviews cardiac drug classes: antiplatelets, anticoagulants, beta blockers, and statins.  Instructor discusses reasons, side effects, and lifestyle considerations for each drug class.   Cardiac Drugs II:  -Group instruction provided by verbal instruction and written materials to support subject.  Instructor reviews cardiac drug classes: angiotensin converting enzyme inhibitors (ACE-I), angiotensin II receptor blockers (ARBs), nitrates, and calcium channel blockers.  Instructor discusses reasons, side effects, and lifestyle considerations for each drug class.   Anatomy and Physiology of the  Circulatory System:  Group verbal and written instruction and models provide basic cardiac anatomy and physiology, with the coronary electrical and arterial systems. Review of: AMI, Angina, Valve disease, Heart Failure, Peripheral Artery Disease, Cardiac Arrhythmia, Pacemakers, and the ICD.   Other Education:  -  Group or individual verbal, written, or video instructions that support the educational goals of the cardiac rehab program.   Holiday Eating Survival Tips:  -Group instruction provided by PowerPoint slides, verbal discussion, and written materials to support subject matter. The instructor gives patients tips, tricks, and techniques to help them not only survive but enjoy the holidays despite the onslaught of food that accompanies the holidays.   Knowledge Questionnaire Score: Knowledge Questionnaire Score - 12/13/18 1029      Knowledge Questionnaire Score   Pre Score  18/24       Core Components/Risk Factors/Patient Goals at Admission: Personal Goals and Risk Factors at Admission - 12/13/18 1029      Core Components/Risk Factors/Patient Goals on Admission    Weight Management  Yes;Weight Maintenance;Weight Loss    Intervention  Weight Management: Develop a combined nutrition and exercise program designed to reach desired caloric intake, while maintaining appropriate intake of nutrient and fiber, sodium and fats, and appropriate energy expenditure required for the weight goal.;Weight Management: Provide education and appropriate resources to help participant work on and attain dietary goals.    Admit Weight  232 lb 5.8 oz (105.4 kg)    Expected Outcomes  Short Term: Continue to assess and modify interventions until short term weight is achieved;Long Term: Adherence to nutrition and physical activity/exercise program aimed toward attainment of established weight goal;Weight Maintenance: Understanding of the daily nutrition guidelines, which includes 25-35% calories from fat, 7% or  less cal from saturated fats, less than 222m cholesterol, less than 1.5gm of sodium, & 5 or more servings of fruits and vegetables daily;Weight Loss: Understanding of general recommendations for a balanced deficit meal plan, which promotes 1-2 lb weight loss per week and includes a negative energy balance of [phone] kcal/d;Understanding recommendations for meals to include 15-35% energy as protein, 25-35% energy from fat, 35-60% energy from carbohydrates, less than 2061mof dietary cholesterol, 20-35 gm of total fiber daily;Understanding of distribution of calorie intake throughout the day with the consumption of 4-5 meals/snacks    Diabetes  Yes    Intervention  Provide education about signs/symptoms and action to take for hypo/hyperglycemia.;Provide education about proper nutrition, including hydration, and aerobic/resistive exercise prescription along with prescribed medications to achieve blood glucose in normal ranges: Fasting glucose 65-99 mg/dL    Expected Outcomes  Short Term: Participant verbalizes understanding of the signs/symptoms and immediate care of hyper/hypoglycemia, proper foot care and importance of medication, aerobic/resistive exercise and nutrition plan for blood glucose control.;Long Term: Attainment of HbA1C < 7%.    Hypertension  Yes    Intervention  Provide education on lifestyle modifcations including regular physical activity/exercise, weight management, moderate sodium restriction and increased consumption of fresh fruit, vegetables, and low fat dairy, alcohol moderation, and smoking cessation.;Monitor prescription use compliance.    Expected Outcomes  Short Term: Continued assessment and intervention until BP is < 140/9061mG in hypertensive participants. < 130/108m24m in hypertensive participants with diabetes, heart failure or chronic kidney disease.;Long Term: Maintenance of blood pressure at goal levels.    Lipids  Yes    Intervention  Provide education and support for  participant on nutrition & aerobic/resistive exercise along with prescribed medications to achieve LDL <70mg71mL >40mg.74mExpected Outcomes  Short Term: Participant states understanding of desired cholesterol values and is compliant with medications prescribed. Participant is following exercise prescription and nutrition guidelines.;Long Term: Cholesterol controlled with medications as prescribed, with individualized exercise RX and with personalized nutrition plan. Value  goals: LDL < 54m, HDL > 40 mg.       Core Components/Risk Factors/Patient Goals Review:    Core Components/Risk Factors/Patient Goals at Discharge (Final Review):    ITP Comments: ITP Comments    Row Name 12/13/18 1111           ITP Comments  Dr TFransico HimMD, Medical Director          Comments: WGwyndolyn Saxonattended orientation on 12/13/2018 to review rules and guidelines for program.  Completed 6 minute walk test, Intitial ITP, and exercise prescription.  VSS. Telemetry-Sinus brady, Sinus Rhythm. .  Asymptomatic.  Heart rate noted as low as 49. Patient is taking metoprolol 25 mg 3 times a day . Will notify Dr HLinard Millers about heart rate.Patient has a follow up appointment with Dr STamala Juliantomorrow.Safety measures and social distancing in place per CDC guidelines.MBarnet Pall RN,BSN 12/13/2018 11:32 AM

## 2018-12-14 ENCOUNTER — Other Ambulatory Visit: Payer: Self-pay | Admitting: Thoracic Surgery (Cardiothoracic Vascular Surgery)

## 2018-12-14 ENCOUNTER — Encounter: Payer: Self-pay | Admitting: Interventional Cardiology

## 2018-12-14 ENCOUNTER — Ambulatory Visit: Payer: PPO | Admitting: Interventional Cardiology

## 2018-12-14 VITALS — BP 138/82 | HR 53 | Ht 71.0 in | Wt 229.1 lb

## 2018-12-14 DIAGNOSIS — I10 Essential (primary) hypertension: Secondary | ICD-10-CM | POA: Diagnosis not present

## 2018-12-14 DIAGNOSIS — Z951 Presence of aortocoronary bypass graft: Secondary | ICD-10-CM | POA: Diagnosis not present

## 2018-12-14 DIAGNOSIS — Z7189 Other specified counseling: Secondary | ICD-10-CM

## 2018-12-14 DIAGNOSIS — N289 Disorder of kidney and ureter, unspecified: Secondary | ICD-10-CM

## 2018-12-14 DIAGNOSIS — E7800 Pure hypercholesterolemia, unspecified: Secondary | ICD-10-CM | POA: Diagnosis not present

## 2018-12-14 DIAGNOSIS — I2510 Atherosclerotic heart disease of native coronary artery without angina pectoris: Secondary | ICD-10-CM

## 2018-12-14 MED ORDER — METOPROLOL TARTRATE 25 MG PO TABS: 25.0000 mg | ORAL_TABLET | Freq: Two times a day (BID) | ORAL | 3 refills | Status: DC

## 2018-12-14 NOTE — Patient Instructions (Signed)
Medication Instructions:  1) DECREASE Metoprolol to 25mg  twice daily.  Your goal blood pressure is 130/80 or less.   *If you need a refill on your cardiac medications before your next appointment, please call your pharmacy*  Lab Work: BMET, Lipid, Liver and CBC today  If you have labs (blood work) drawn today and your tests are completely normal, you will receive your results only by: Marland Kitchen MyChart Message (if you have MyChart) OR . A paper copy in the mail If you have any lab test that is abnormal or we need to change your treatment, we will call you to review the results.  Testing/Procedures: None  Follow-Up: At [facility], you and your health needs are our priority.  As part of our continuing mission to provide you with exceptional heart care, we have created designated Provider Care Teams.  These Care Teams include your primary Cardiologist (physician) and Advanced Practice Providers (APPs -  Physician Assistants and Nurse Practitioners) who all work together to provide you with the care you need, when you need it.  Your next appointment:   3-4 month(s)  The format for your next appointment:   In Person  Provider:   You may see Sinclair Grooms, MD or one of the following Advanced Practice Providers on your designated Care Team:    Truitt Merle, NP  Cecilie Kicks, NP  Kathyrn Drown, NP   Other Instructions

## 2018-12-15 LAB — HEPATIC FUNCTION PANEL
ALT: 16 IU/L (ref 0–44)
AST: 19 IU/L (ref 0–40)
Albumin: 4.5 g/dL (ref 3.8–4.8)
Alkaline Phosphatase: 114 IU/L (ref 39–117)
Bilirubin Total: 0.4 mg/dL (ref 0.0–1.2)
Bilirubin, Direct: 0.12 mg/dL (ref 0.00–0.40)
Total Protein: 7.1 g/dL (ref 6.0–8.5)

## 2018-12-15 LAB — CBC
Hematocrit: 38.7 % (ref 37.5–51.0)
Hemoglobin: 12.1 g/dL — ABNORMAL LOW (ref 13.0–17.7)
MCH: 26.9 pg (ref 26.6–33.0)
MCHC: 31.3 g/dL — ABNORMAL LOW (ref 31.5–35.7)
MCV: 86 fL (ref 79–97)
Platelets: 269 10*3/uL (ref 150–450)
RBC: 4.5 x10E6/uL (ref 4.14–5.80)
RDW: 13.1 % (ref 11.6–15.4)
WBC: 5.4 10*3/uL (ref 3.4–10.8)

## 2018-12-15 LAB — LIPID PANEL
Chol/HDL Ratio: 2.9 ratio (ref 0.0–5.0)
Cholesterol, Total: 147 mg/dL (ref 100–199)
HDL: 51 mg/dL (ref >39)
LDL Chol Calc (NIH): 80 mg/dL (ref 0–99)
Triglycerides: 84 mg/dL (ref 0–149)
VLDL Cholesterol Cal: 16 mg/dL (ref 5–40)

## 2018-12-15 LAB — BASIC METABOLIC PANEL
BUN/Creatinine Ratio: 13 (ref 10–24)
BUN: 22 mg/dL (ref 8–27)
CO2: 26 mmol/L (ref 20–29)
Calcium: 9.7 mg/dL (ref 8.6–10.2)
Chloride: 102 mmol/L (ref 96–106)
Creatinine, Ser: 1.66 mg/dL — ABNORMAL HIGH (ref 0.76–1.27)
GFR calc Af Amer: 50 mL/min/{1.73_m2} — ABNORMAL LOW (ref >59)
GFR calc non Af Amer: 43 mL/min/{1.73_m2} — ABNORMAL LOW (ref >59)
Glucose: 91 mg/dL (ref 65–99)
Potassium: 4.9 mmol/L (ref 3.5–5.2)
Sodium: 139 mmol/L (ref 134–144)

## 2018-12-16 ENCOUNTER — Ambulatory Visit
Admission: RE | Admit: 2018-12-16 | Discharge: 2018-12-16 | Disposition: A | Discharge status: Home / Self Care | Payer: PPO | Source: Ambulatory Visit | Attending: Thoracic Surgery (Cardiothoracic Vascular Surgery) | Admitting: Thoracic Surgery (Cardiothoracic Vascular Surgery)

## 2018-12-16 ENCOUNTER — Encounter: Payer: Self-pay | Admitting: Thoracic Surgery (Cardiothoracic Vascular Surgery)

## 2018-12-16 ENCOUNTER — Other Ambulatory Visit: Payer: Self-pay

## 2018-12-16 ENCOUNTER — Ambulatory Visit (INDEPENDENT_AMBULATORY_CARE_PROVIDER_SITE_OTHER): Payer: Self-pay | Admitting: Thoracic Surgery (Cardiothoracic Vascular Surgery)

## 2018-12-16 VITALS — BP 133/80 | HR 56 | Temp 96.7°F | Resp 20 | Ht 71.0 in | Wt 229.0 lb

## 2018-12-16 DIAGNOSIS — I2510 Atherosclerotic heart disease of native coronary artery without angina pectoris: Secondary | ICD-10-CM | POA: Diagnosis not present

## 2018-12-16 DIAGNOSIS — Z951 Presence of aortocoronary bypass graft: Secondary | ICD-10-CM

## 2018-12-16 NOTE — Progress Notes (Signed)
      [address]       [place] [address]             [phone]        [name] [place] Medical Record #[id number] Date of Birth: 24-Nov-1955  Referring: [name], [name] Primary Care: [name] Dance, DO Primary Cardiologist:[name] [name], [name]  Reason for visit:   follow-up  History of Present Illness:     [name] comes for his second postop visit.  He has done well.  Physical Exam: BP 133/80   Pulse (!) 56   Temp (!) 96.7 F (35.9 C) (Skin)   Resp 20   Ht 5\' 11"  (1.803 m)   Wt 229 lb (103.9 kg)   SpO2 97% Comment: RA  BMI 31.94 kg/m   Alert NAD Incision well-healed.  Sternum stable Abdomen soft, ND No peripheral edema   Diagnostic Studies & Laboratory data: CXR: Clear     Assessment / Plan:   63 year old male status post CABG.  Doing well Cleared for cardiac rehab Return to clinic as needed   [name] 12/16/2018 2:00 PM

## 2018-12-19 ENCOUNTER — Other Ambulatory Visit: Payer: Self-pay

## 2018-12-19 ENCOUNTER — Other Ambulatory Visit: Payer: Self-pay | Admitting: Family Medicine

## 2018-12-19 ENCOUNTER — Encounter (HOSPITAL_COMMUNITY)
Admission: RE | Admit: 2018-12-19 | Discharge: 2018-12-19 | Disposition: A | Discharge status: Home / Self Care | Payer: PPO | Source: Ambulatory Visit | Attending: Interventional Cardiology | Admitting: Interventional Cardiology

## 2018-12-19 DIAGNOSIS — Z7982 Long term (current) use of aspirin: Secondary | ICD-10-CM | POA: Diagnosis not present

## 2018-12-19 DIAGNOSIS — E785 Hyperlipidemia, unspecified: Secondary | ICD-10-CM | POA: Diagnosis not present

## 2018-12-19 DIAGNOSIS — G4733 Obstructive sleep apnea (adult) (pediatric): Secondary | ICD-10-CM | POA: Diagnosis not present

## 2018-12-19 DIAGNOSIS — Z79899 Other long term (current) drug therapy: Secondary | ICD-10-CM | POA: Diagnosis not present

## 2018-12-19 DIAGNOSIS — Z951 Presence of aortocoronary bypass graft: Secondary | ICD-10-CM | POA: Diagnosis not present

## 2018-12-19 DIAGNOSIS — I2510 Atherosclerotic heart disease of native coronary artery without angina pectoris: Secondary | ICD-10-CM | POA: Diagnosis not present

## 2018-12-19 DIAGNOSIS — E119 Type 2 diabetes mellitus without complications: Secondary | ICD-10-CM | POA: Diagnosis not present

## 2018-12-19 DIAGNOSIS — I10 Essential (primary) hypertension: Secondary | ICD-10-CM | POA: Diagnosis not present

## 2018-12-19 LAB — GLUCOSE, CAPILLARY: Glucose-Capillary: 145 mg/dL — ABNORMAL HIGH (ref 70–99)

## 2018-12-19 NOTE — Progress Notes (Signed)
Daily Session Note  Patient Details  Name: [name] MRN: [id number] Date of Birth: 01/10/1955 Referring Provider:     CARDIAC REHAB PHASE II ORIENTATION from 12/13/2018 in [facility]  Referring Provider  Dr. Smith      Encounter Date: 12/19/2018  Check In: Session Check In - 12/19/18 0724      Check-In   Supervising physician immediately available to respond to emergencies  [facility] immediately available    Physician(s)  Dr. Kamineni    Location  [facility]    Staff Present  Tara Everett, RN, BSN;Dalton Fletcher, MS, Exercise Physiologist;Tyara Nevels, MS,ACSM CEP, Exercise Physiologist;Portia Payne, RN, BSN    Virtual Visit  No    Medication changes reported      No    Fall or balance concerns reported     No    Tobacco Cessation  No Change    Warm-up and Cool-down  Performed on first and last piece of equipment    Resistance Training Performed  Yes    VAD Patient?  No    PAD/SET Patient?  No      Pain Assessment   Currently in Pain?  No/denies    Multiple Pain Sites  No       Capillary Blood Glucose: No results found for this or any previous visit (from the past 24 hour(s)).  Exercise Prescription Changes - 12/19/18 0800      Response to Exercise   Blood Pressure (Admit)  128/82    Blood Pressure (Exercise)  140/80    Blood Pressure (Exit)  128/80    Heart Rate (Admit)  66 bpm    Heart Rate (Exercise)  90 bpm    Heart Rate (Exit)  64 bpm    Rating of Perceived Exertion (Exercise)  11    Perceived Dyspnea (Exercise)  0    Symptoms  None     Comments  Pt oriented to exercise equipment    Duration  Progress to 30 minutes of  aerobic without signs/symptoms of physical distress    Intensity  THRR unchanged      Progression   Progression  Continue to progress workloads to maintain intensity without signs/symptoms of physical distress.    Average METs  3      Resistance Training   Training  Prescription  Yes    Weight  4lbs    Reps  10-15    Time  10 Minutes      Treadmill   MPH  2.8    Grade  1    Minutes  15    METs  3.53      NuStep   Level  3    SPM  85    Minutes  15    METs  2.5       Social History   Tobacco Use  Smoking Status Former Smoker  . Quit date: 01/06/1976  . Years since quitting: 42.9  Smokeless Tobacco Former User  Tobacco Comment   smoked 35 years ago as of 2013     Goals Met:  Exercise tolerated well  Goals Unmet:  Not Applicable  Comments: Pt started cardiac rehab today.  Pt tolerated light exercise without difficulty. VSS, telemetry-SR, asymptomatic.  Medication list reconciled. Pt denies barriers to medicaiton compliance.  PSYCHOSOCIAL ASSESSMENT:  PHQ-0. Pt exhibits positive coping skills, hopeful outlook with supportive family. No psychosocial needs identified at this time, no psychosocial interventions necessary.     Pt oriented to exercise equipment and routine.    Understanding verbalized.    Dr. Traci Turner is Medical Director for Cardiac Rehab at [facility]. 

## 2018-12-21 ENCOUNTER — Other Ambulatory Visit: Payer: Self-pay

## 2018-12-21 ENCOUNTER — Encounter (HOSPITAL_COMMUNITY)
Admission: RE | Admit: 2018-12-21 | Discharge: 2018-12-21 | Disposition: A | Discharge status: Home / Self Care | Payer: PPO | Source: Ambulatory Visit | Attending: Interventional Cardiology | Admitting: Interventional Cardiology

## 2018-12-21 DIAGNOSIS — Z951 Presence of aortocoronary bypass graft: Secondary | ICD-10-CM

## 2018-12-21 LAB — GLUCOSE, CAPILLARY
Glucose-Capillary: 148 mg/dL — ABNORMAL HIGH (ref 70–99)
Glucose-Capillary: 166 mg/dL — ABNORMAL HIGH (ref 70–99)

## 2018-12-21 NOTE — Progress Notes (Signed)
[name] 63 y.o. 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 Carpal tunnel syndrome, bilateral   . Carpal tunnel syndrome, right    nerve impingement right arm/wears brace  . Coronary artery disease   . Diabetes mellitus    no meds  . Dyspnea   . GERD (gastroesophageal reflux disease)   . Gout   . Hepatic steatosis   . Hyperlipidemia   . Hypertension   . Obstructive sleep apnea      Medications reviewed.   Current Outpatient Medications:  .  acetaminophen (TYLENOL) 500 MG tablet, Take 2 tablets (1,000 mg total) by mouth every 6 (six) hours as needed., Disp: 30 tablet, Rfl: 0 .  allopurinol (ZYLOPRIM) 300 MG tablet, TAKE 1/2 TABLET BY MOUTH DAILY, Disp: 45 tablet, Rfl: 0 .  Ascorbic Acid (VITAMIN C) 1000 MG tablet, Take 1,000 mg by mouth daily., Disp: , Rfl:  .  aspirin 81 MG tablet, Take 81 mg by mouth daily.  , Disp: , Rfl:  .  atorvastatin (LIPITOR) 80 MG tablet, Take 1 tablet (80 mg total) by mouth at bedtime., Disp: 30 tablet, Rfl: 3 .  blood glucose meter kit and supplies, Dispense based on patient and insurance preference. Use to check fasting blood glucose in the morning and to check glucose 2 hours after largest meal of the day. (FOR ICD-10 E10.9, E11.9)., Disp: 1 each, Rfl: 0 .  FLUoxetine (PROZAC) 40 MG capsule, TAKE 1 CAPSULE BY MOUTH DAILY, Disp: 90 capsule, Rfl: 1 .  gabapentin (NEURONTIN) 800 MG tablet, Take 1 tablet (800 mg total) by mouth 3 (three) times daily., Disp: 270 tablet, Rfl: 1 .  glucose blood test strip, Use as instructed, Disp: 100 each, Rfl: 12 .  LANCETS ULTRA FINE MISC, Use to check fasting blood glucose in the morning and to check glucose 2 hours after largest meal of the day, Disp: 100 each, Rfl: 12 .  metFORMIN (GLUCOPHAGE-XR) 500 MG 24 hr tablet, TAKE 1 TABLET BY MOUTH DAILY WITH BREAKFAST, Disp: 90 tablet, Rfl: 1 .  metoprolol tartrate (LOPRESSOR) 25 MG tablet, Take 1 tablet (25 mg  total) by mouth 2 (two) times daily., Disp: 180 tablet, Rfl: 3 .  Multiple Vitamins-Iron (MULTIVITAMINS WITH IRON) TABS, Take 1 tablet by mouth daily., Disp: 30 tablet, Rfl: 0 .  pantoprazole (PROTONIX) 40 MG tablet, TAKE 1 TABLET BY MOUTH DAILY, Disp: 30 tablet, Rfl: 0 .  polyethylene glycol powder (GLYCOLAX/MIRALAX) powder, USE AS DIRECTED, Disp: 850 g, Rfl: 11 .  pyridoxine (B-6) 100 MG tablet, Take 100 mg by mouth daily., Disp: , Rfl:  .  traMADol (ULTRAM) 50 MG tablet, Take 1-2 tablets (50-100 mg total) by mouth every 4 (four) hours as needed for moderate pain., Disp: 30 tablet, Rfl: 0 .  traZODone (DESYREL) 50 MG tablet, TAKE 1/2 TO 1 TABLET BY MOUTH AT BEDTIMEAS NEEDED FOR SLEEP, Disp: 120 tablet, Rfl: 1 .  Vitamin D, Ergocalciferol, (DRISDOL) 1.25 MG (50000 UT) CAPS capsule, TAKE 1 CAPSULE BY MOUTH EVERY 7 DAYS, Disp: 12 capsule, Rfl: 10   Ht Readings from Last 1 Encounters:  12/16/18 '5\' 11"'  (1.803 m)     Wt Readings from Last 3 Encounters:  12/16/18 229 lb (103.9 kg)  12/14/18 229 lb 1.9 oz (103.9 kg)  12/13/18 232 lb 5.8 oz (105.4 kg)     There is no height or weight on file to calculate BMI.   Social History  Tobacco Use  Smoking Status Former Smoker  . Quit date: 01/06/1976  . Years since quitting: 42.9  Smokeless Tobacco Former User  Tobacco Comment   smoked 35 years ago as of 2013      Lab Results  Component Value Date   CHOL 147 12/14/2018   Lab Results  Component Value Date   HDL 51 12/14/2018   Lab Results  Component Value Date   LDLCALC 80 12/14/2018   Lab Results  Component Value Date   TRIG 84 12/14/2018   Lab Results  Component Value Date   CHOLHDL 2.9 12/14/2018     Lab Results  Component Value Date   HGBA1C 6.6 (H) 10/26/2018     CBG (last 3)  Recent Labs    12/19/18 0813  GLUCAP 145*     Nutrition Note  Spoke with pt. Nutrition Plan and Nutrition Survey goals reviewed with pt. Pt is following a Heart Healthy diet. Pt wants to  lose wt. Pt has been trying to lose wt by cutting back on snacks and adding more physical activity and exercise. Pt was going to the gym daily before [place] pandemic closures in March. He has continued to eat a healthy diet but has lacked exercise. He reports gaining 15 lbs d/t lack of exercise.   Pt has Type 2 Diabetes. Last A1c indicates blood glucose well-controlled. Pt checks CBG's 1 times a day. Fasting CBG's reportedly 140-170 mg/dL. Pt says his fasting CBG's have increased since CABG. Reviewed ways to lower fasting blood sugars and pt agreeable to doing blood sugar log.   Per discussion, pt does not use canned/convenience foods often. Pt does not add salt to food. Pt does not eat out frequently.   Pt expressed understanding of the information reviewed.    Nutrition Diagnosis ? Food-and nutrition-related knowledge deficit related to lack of exposure to information as related to diagnosis of: ? CVD ? Type 2 Diabetes  Obese  I = 30-34.9 related to excessive energy intake as evidenced by a 31.94  Nutrition Intervention ? Pt's individual nutrition plan reviewed with pt. ? Benefits of adopting Heart Healthy diet discussed when Med[employer]s reviewed.   ? Continue client-centered nutrition education by RD, as part of interdisciplinary care.  Goal(s) ? Pt to identify food quantities necessary to achieve weight loss of 6-24 lb at graduation from cardiac rehab.  ? CBG concentrations in the normal range or as close to normal as is safely possible.   Plan:   Will provide client-centered nutrition education as part of interdisciplinary care  Monitor and evaluate progress toward nutrition goal with team.   Michaele Offer, MS, RDN, LDN

## 2018-12-23 ENCOUNTER — Other Ambulatory Visit: Payer: Self-pay

## 2018-12-23 ENCOUNTER — Encounter (HOSPITAL_COMMUNITY)
Admission: RE | Admit: 2018-12-23 | Discharge: 2018-12-23 | Disposition: A | Discharge status: Home / Self Care | Payer: PPO | Source: Ambulatory Visit | Attending: Interventional Cardiology | Admitting: Interventional Cardiology

## 2018-12-23 DIAGNOSIS — Z951 Presence of aortocoronary bypass graft: Secondary | ICD-10-CM

## 2018-12-26 ENCOUNTER — Encounter (HOSPITAL_COMMUNITY)
Admission: RE | Admit: 2018-12-26 | Discharge: 2018-12-26 | Disposition: A | Discharge status: Home / Self Care | Payer: PPO | Source: Ambulatory Visit | Attending: Interventional Cardiology | Admitting: Interventional Cardiology

## 2018-12-26 ENCOUNTER — Other Ambulatory Visit: Payer: Self-pay

## 2018-12-26 DIAGNOSIS — Z951 Presence of aortocoronary bypass graft: Secondary | ICD-10-CM

## 2018-12-28 ENCOUNTER — Encounter (HOSPITAL_COMMUNITY)
Admission: RE | Admit: 2018-12-28 | Discharge: 2018-12-28 | Disposition: A | Discharge status: Home / Self Care | Payer: PPO | Source: Ambulatory Visit | Attending: Interventional Cardiology | Admitting: Interventional Cardiology

## 2018-12-28 ENCOUNTER — Other Ambulatory Visit: Payer: Self-pay

## 2018-12-28 DIAGNOSIS — Z951 Presence of aortocoronary bypass graft: Secondary | ICD-10-CM

## 2019-01-02 ENCOUNTER — Other Ambulatory Visit: Payer: Self-pay

## 2019-01-02 ENCOUNTER — Encounter (HOSPITAL_COMMUNITY)
Admission: RE | Admit: 2019-01-02 | Discharge: 2019-01-02 | Disposition: A | Discharge status: Home / Self Care | Payer: PPO | Source: Ambulatory Visit | Attending: Interventional Cardiology | Admitting: Interventional Cardiology

## 2019-01-02 ENCOUNTER — Other Ambulatory Visit: Payer: Self-pay | Admitting: Family Medicine

## 2019-01-02 DIAGNOSIS — Z951 Presence of aortocoronary bypass graft: Secondary | ICD-10-CM

## 2019-01-04 ENCOUNTER — Other Ambulatory Visit: Payer: Self-pay

## 2019-01-04 ENCOUNTER — Encounter (HOSPITAL_COMMUNITY)
Admission: RE | Admit: 2019-01-04 | Discharge: 2019-01-04 | Disposition: A | Discharge status: Home / Self Care | Payer: PPO | Source: Ambulatory Visit | Attending: Interventional Cardiology | Admitting: Interventional Cardiology

## 2019-01-04 DIAGNOSIS — Z951 Presence of aortocoronary bypass graft: Secondary | ICD-10-CM

## 2019-01-05 NOTE — Progress Notes (Signed)
Discharge Progress Report  Patient Details  Name: [name] MRN: [id number] Date of Birth: 1955/06/24 Referring Provider:     [place] from 12/13/2018 in [place]  Referring Provider  Dr. Tamala Julian       Number of Visits: 7  Reason for Discharge:  Patient reached a stable level of exercise. Patient independent in their exercise.  Smoking History:  Social History   Tobacco Use  Smoking Status Former Smoker  . Quit date: 01/06/1976  . 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 Tamala Julian    Expected Discharge Date  02/10/19      Treadmill   MPH  2.8    Grade  1    Minutes  15      NuStep   Level  3    SPM  85    Minutes  15    METs  3      Prescription Details   Frequency (times per week)  3    Duration  Progress to 30 minutes of continuous aerobic without signs/symptoms of physical distress      Intensity   THRR 40-80% of Max Heartrate  63-126    Ratings of Perceived Exertion  11-13      Progression   Progression  Continue to progress workloads to maintain intensity without signs/symptoms of physical distress.      Resistance Training   Training Prescription  Yes    Weight  4 lbs.     Reps  10-15       Discharge Exercise Prescription (Final Exercise Prescription Changes): Exercise Prescription Changes - 01/04/19 0838      Response to Exercise   Blood Pressure (Admit)  122/70    Blood Pressure (Exercise)  138/80    Blood Pressure (Exit)  120/62    Heart Rate (Admit)  68 bpm    Heart Rate (Exercise)  92 bpm    Heart Rate (Exit)  61 bpm    Rating of Perceived Exertion (Exercise)  12    Perceived  Dyspnea (Exercise)  0    Symptoms  None     Comments  Pt last day prior to departmental closure    Duration  Progress to 30 minutes of  aerobic without signs/symptoms of physical distress    Intensity  THRR unchanged      Progression   Progression  Continue to progress workloads to maintain intensity without signs/symptoms of physical distress.    Average METs  3.4      Resistance Training   Training Prescription  Yes    Weight  4lbs    Reps  10-15    Time  10 Minutes      Treadmill   MPH  3    Grade  2    Minutes  15    METs  4.12      NuStep   Level  4    SPM  105    Minutes  15    METs  3.4      Home Exercise Plan   Plans to continue exercise  at  [employer] (comment)   [facility]   Frequency  Add 3 additional days to program exercise sessions.    Initial Home Exercises Provided  01/04/19       Functional Capacity: 6 Minute Walk    Row Name 12/13/18 1023         6 Minute Walk   Phase  Initial     Distance  1594 feet     Walk Time  6 minutes     # of Rest Breaks  0     MPH  3     METS  3.2     RPE  10     Perceived Dyspnea   0     VO2 Peak  11.4     Symptoms  No     Resting HR  59 bpm     Resting BP  122/72     Resting Oxygen Saturation   98 %     Exercise Oxygen Saturation  during 6 min walk  99 %     Max Ex. HR  81 bpm     Max Ex. BP  124/70     2 Minute Post BP  122/72        Psychological, QOL, Others - Outcomes: PHQ 2/9: Depression screen [facility] 2/9 12/13/2018 12/05/2018 08/29/2018 08/09/2018 05/10/2018  Decreased Interest 0 0 0 0 0  Down, Depressed, Hopeless 0 0 0 0 0  PHQ - 2 Score 0 0 0 0 0  Altered sleeping - 0 0 0 0  Tired, decreased energy - 0 0 0 0  Change in appetite - 0 0 0 0  Feeling bad or failure about yourself  - 0 0 0 0  Trouble concentrating - 0 0 0 0  Moving slowly or fidgety/restless - 0 0 0 0  Suicidal thoughts - 0 0 0 0  PHQ-9 Score - 0 0 0 0  Difficult doing work/chores - - - Not difficult at all Not difficult at all   Some recent data might be hidden    Quality of Life: Quality of Life - 12/13/18 1027      Quality of Life   Select  Quality of Life      Quality of Life Scores   Health/Function Pre  26.4 %    Socioeconomic Pre  23.58 %    Psych/Spiritual Pre  29.14 %    Family Pre  19.13 %    GLOBAL Pre  25.56 %       Personal Goals: Goals established at orientation with interventions provided to work toward goal. Personal Goals and Risk Factors at Admission - 12/13/18 1029      Core Components/Risk Factors/Patient Goals on Admission    Weight Management  Yes;Weight Maintenance;Weight Loss    Intervention  Weight Management: Develop a combined nutrition and exercise program designed to reach desired caloric intake, while maintaining appropriate intake of nutrient and fiber, sodium and fats, and appropriate energy expenditure required for the weight goal.;Weight Management: Provide education and appropriate resources to help participant work on and attain dietary goals.    Admit Weight  232 lb 5.8 oz (105.4 kg)    Expected Outcomes  Short Term: Continue to assess and modify interventions until short term weight is achieved;Long Term: Adherence to nutrition and physical activity/exercise program aimed toward attainment of established weight goal;Weight Maintenance: Understanding of the daily nutrition guidelines, which includes 25-35% calories from fat, 7% or less cal from saturated fats, less  than 200mg  cholesterol, less than 1.5gm of sodium, & 5 or more servings of fruits and vegetables daily;Weight Loss: Understanding of general recommendations for a balanced deficit meal plan, which promotes 1-2 lb weight loss per week and includes a negative energy balance of [phone] kcal/d;Understanding recommendations for meals to include 15-35% energy as protein, 25-35% energy from fat, 35-60% energy from carbohydrates, less than 200mg  of dietary cholesterol, 20-35 gm of total fiber daily;Understanding of  distribution of calorie intake throughout the day with the consumption of 4-5 meals/snacks    Diabetes  Yes    Intervention  Provide education about signs/symptoms and action to take for hypo/hyperglycemia.;Provide education about proper nutrition, including hydration, and aerobic/resistive exercise prescription along with prescribed medications to achieve blood glucose in normal ranges: Fasting glucose 65-99 mg/dL    Expected Outcomes  Short Term: Participant verbalizes understanding of the signs/symptoms and immediate care of hyper/hypoglycemia, proper foot care and importance of medication, aerobic/resistive exercise and nutrition plan for blood glucose control.;Long Term: Attainment of HbA1C < 7%.    Hypertension  Yes    Intervention  Provide education on lifestyle modifcations including regular physical activity/exercise, weight management, moderate sodium restriction and increased consumption of fresh fruit, vegetables, and low fat dairy, alcohol moderation, and smoking cessation.;Monitor prescription use compliance.    Expected Outcomes  Short Term: Continued assessment and intervention until BP is < 140/36mm HG in hypertensive participants. < 130/61mm HG in hypertensive participants with diabetes, heart failure or chronic kidney disease.;Long Term: Maintenance of blood pressure at goal levels.    Lipids  Yes    Intervention  Provide education and support for participant on nutrition & aerobic/resistive exercise along with prescribed medications to achieve LDL 70mg , HDL >40mg .    Expected Outcomes  Short Term: Participant states understanding of desired cholesterol values and is compliant with medications prescribed. Participant is following exercise prescription and nutrition guidelines.;Long Term: Cholesterol controlled with medications as prescribed, with individualized exercise RX and with personalized nutrition plan. Value goals: LDL < 70mg , HDL > 40 mg.        Personal Goals  Discharge: Goals and Risk Factor Review    Row Name 12/19/18 0926 01/05/19 0958           Core Components/Risk Factors/Patient Goals Review   Personal Goals Review  Weight Management/Obesity;Hypertension;Lipids;Diabetes  Weight Management/Obesity;Hypertension;Lipids;Diabetes      Review  Pt with multiple CAD RFs willing to participate in CR exercise.  [name] would like to complete tasks for rehab and be able to get back to work.  Pt with multiple CAD RFs willing to participate in CR exercise.  [name] continues to tolerate exercise well.  At this time the CR program is halting in person exercise d/t COVID-19 pandemic.  Pt declined to participate in virtual cardiac rehab and instead would like to workout on his own.  Pt completed 7 sessions.      Expected Outcomes  [name] will be continue to participate in CR exercise, nutrition, and lifestyle modification opportunities.  [name] will continue to participate in CR exercise, nutrition, and lifestyle modification opportunities.  He plans to work out at [employer].         Exercise Goals and Review: Exercise Goals    Row Name 12/13/18 1026             Exercise Goals   Increase Physical Activity  Yes       Intervention  Provide advice, education, support and counseling about physical activity/exercise needs.;Develop an individualized  exercise prescription for aerobic and resistive training based on initial evaluation findings, risk stratification, comorbidities and participant's personal goals.       Expected Outcomes  Short Term: Attend rehab on a regular basis to increase amount of physical activity.;Long Term: Add in home exercise to make exercise part of routine and to increase amount of physical activity.;Long Term: Exercising regularly at least 3-5 days a week.       Increase Strength and Stamina  Yes       Intervention  Provide advice, education, support and counseling about physical activity/exercise needs.;Develop an individualized  exercise prescription for aerobic and resistive training based on initial evaluation findings, risk stratification, comorbidities and participant's personal goals.       Expected Outcomes  Short Term: Increase workloads from initial exercise prescription for resistance, speed, and METs.;Short Term: Perform resistance training exercises routinely during rehab and add in resistance training at home;Long Term: Improve cardiorespiratory fitness, muscular endurance and strength as measured by increased METs and functional capacity (6MWT)       Able to understand and use rate of perceived exertion (RPE) scale  Yes       Intervention  Provide education and explanation on how to use RPE scale       Expected Outcomes  Short Term: Able to use RPE daily in rehab to express subjective intensity level;Long Term:  Able to use RPE to guide intensity level when exercising independently       Knowledge and understanding of Target Heart Rate Range (THRR)  Yes       Intervention  Provide education and explanation of THRR including how the numbers were predicted and where they are located for reference       Expected Outcomes  Short Term: Able to state/look up THRR;Long Term: Able to use THRR to govern intensity when exercising independently;Short Term: Able to use daily as guideline for intensity in rehab       Able to check pulse independently  Yes       Intervention  Provide education and demonstration on how to check pulse in carotid and radial arteries.;Review the importance of being able to check your own pulse for safety during independent exercise       Expected Outcomes  Short Term: Able to explain why pulse checking is important during independent exercise;Long Term: Able to check pulse independently and accurately       Understanding of Exercise Prescription  Yes       Intervention  Provide education, explanation, and written materials on patient's individual exercise prescription       Expected Outcomes  Short  Term: Able to explain program exercise prescription;Long Term: Able to explain home exercise prescription to exercise independently          Exercise Goals Re-Evaluation:   Nutrition & Weight - Outcomes: Pre Biometrics - 12/13/18 1027      Pre Biometrics   Height  5\' 11"  (1.803 m)    Weight  105.4 kg    Waist Circumference  44 inches    Hip Circumference  44 inches    Waist to Hip Ratio  1 %    BMI (Calculated)  32.42    Triceps Skinfold  22 mm    % Body Fat  32.3 %    Grip Strength  33.5 kg    Flexibility  13 in    Single Leg Stand  22 seconds        Nutrition: Nutrition Therapy &  Goals - 12/21/18 0828      Nutrition Therapy   Diet  heart healthy    Drug/Food Interactions  Statins/Certain Fruits      Personal Nutrition Goals   Nutrition Goal  CBG concentrations in the normal range or as close to normal as is safely possible.   Fasting CBG's s/p CABG 140-170 mg/dl   Personal Goal #2  Pt to identify food quantities necessary to achieve weight loss of 6-24 lb at graduation from cardiac rehab.      Intervention Plan   Intervention  Nutrition handout(s) given to patient.;Prescribe, educate and counsel regarding individualized specific dietary modifications aiming towards targeted core components such as weight, hypertension, lipid management, diabetes, heart failure and other comorbidities.    Expected Outcomes  Short Term Goal: A plan has been developed with personal nutrition goals set during dietitian appointment.;Long Term Goal: Adherence to prescribed nutrition plan.       Nutrition Discharge: Nutrition Assessments - 12/21/18 0835      MEDFICTS Scores   Pre Score  52       Education Questionnaire Score: Knowledge Questionnaire Score - 12/13/18 1029      Knowledge Questionnaire Score   Pre Score  18/24       Goals reviewed with patient; copy given to patient.

## 2019-01-09 ENCOUNTER — Encounter (HOSPITAL_COMMUNITY): Payer: PPO

## 2019-01-10 DIAGNOSIS — Z79899 Other long term (current) drug therapy: Secondary | ICD-10-CM | POA: Diagnosis not present

## 2019-01-10 DIAGNOSIS — M7061 Trochanteric bursitis, right hip: Secondary | ICD-10-CM | POA: Diagnosis not present

## 2019-01-10 DIAGNOSIS — Z5181 Encounter for therapeutic drug level monitoring: Secondary | ICD-10-CM | POA: Diagnosis not present

## 2019-01-10 DIAGNOSIS — M47816 Spondylosis without myelopathy or radiculopathy, lumbar region: Secondary | ICD-10-CM | POA: Diagnosis not present

## 2019-01-10 DIAGNOSIS — M25551 Pain in right hip: Secondary | ICD-10-CM | POA: Diagnosis not present

## 2019-01-10 DIAGNOSIS — M5416 Radiculopathy, lumbar region: Secondary | ICD-10-CM | POA: Diagnosis not present

## 2019-01-11 ENCOUNTER — Encounter (HOSPITAL_COMMUNITY): Payer: PPO

## 2019-01-13 ENCOUNTER — Encounter (HOSPITAL_COMMUNITY): Payer: PPO

## 2019-01-16 ENCOUNTER — Other Ambulatory Visit: Payer: Self-pay | Admitting: Family Medicine

## 2019-01-16 ENCOUNTER — Encounter (HOSPITAL_COMMUNITY): Payer: PPO

## 2019-01-18 ENCOUNTER — Encounter (HOSPITAL_COMMUNITY): Payer: PPO

## 2019-01-20 ENCOUNTER — Encounter (HOSPITAL_COMMUNITY): Payer: PPO

## 2019-01-23 ENCOUNTER — Encounter (HOSPITAL_COMMUNITY): Payer: PPO

## 2019-01-25 ENCOUNTER — Encounter (HOSPITAL_COMMUNITY): Payer: PPO

## 2019-01-27 ENCOUNTER — Encounter (HOSPITAL_COMMUNITY): Payer: PPO

## 2019-01-30 ENCOUNTER — Encounter (HOSPITAL_COMMUNITY): Payer: PPO

## 2019-02-01 ENCOUNTER — Encounter (HOSPITAL_COMMUNITY): Payer: PPO

## 2019-02-03 ENCOUNTER — Encounter (HOSPITAL_COMMUNITY): Payer: PPO

## 2019-02-06 ENCOUNTER — Encounter (HOSPITAL_COMMUNITY): Payer: PPO

## 2019-02-06 ENCOUNTER — Other Ambulatory Visit: Payer: Self-pay | Admitting: Family Medicine

## 2019-02-06 ENCOUNTER — Telehealth: Payer: Self-pay

## 2019-02-06 DIAGNOSIS — I152 Hypertension secondary to endocrine disorders: Secondary | ICD-10-CM

## 2019-02-06 DIAGNOSIS — E1169 Type 2 diabetes mellitus with other specified complication: Secondary | ICD-10-CM

## 2019-02-06 DIAGNOSIS — E1159 Type 2 diabetes mellitus with other circulatory complications: Secondary | ICD-10-CM

## 2019-02-06 NOTE — Telephone Encounter (Signed)
Please call pt to schedule in office appt.  No further refills until pt is seen.  Charyl Bigger, CMA

## 2019-02-08 ENCOUNTER — Encounter (HOSPITAL_COMMUNITY): Payer: PPO

## 2019-02-10 ENCOUNTER — Encounter (HOSPITAL_COMMUNITY): Payer: PPO

## 2019-02-13 ENCOUNTER — Other Ambulatory Visit: Payer: Self-pay | Admitting: Family Medicine

## 2019-02-14 DIAGNOSIS — M549 Dorsalgia, unspecified: Secondary | ICD-10-CM | POA: Diagnosis not present

## 2019-03-07 ENCOUNTER — Telehealth: Payer: Self-pay | Admitting: Family Medicine

## 2019-03-07 ENCOUNTER — Other Ambulatory Visit: Payer: Self-pay | Admitting: Physician Assistant

## 2019-03-07 NOTE — Telephone Encounter (Signed)
Note   Please call pt to schedule in office appt.  No further refills until pt is seen.  Charyl Bigger, CMA      --3/2 Called pt to set up Appt/ Telehealth for Rx refills-- no answer & unable to leave message [facility] not set up--  ---FYI to med asst.  -glh

## 2019-03-09 ENCOUNTER — Telehealth: Payer: Self-pay | Admitting: Interventional Cardiology

## 2019-03-09 NOTE — Telephone Encounter (Signed)
I spoke to the patient who would like his wife to accompany him to his appointment on 3/8 with Dr Tamala Julian.  I explained to him our protocol for allowing family members to accompany and he was acceptable to the fact.  He would like his wife put on a speaker phone during the visit, please.  I told him that I would forward to Dr Thompson Caul nurse.

## 2019-03-09 NOTE — Telephone Encounter (Signed)
Patient is requesting wife, [name], to come with him to his appt on Monday with Dr. Tamala Julian. He states his wife knows a lot more about whats going on. He feels like she would be able to explain the issues better than he could to Dr. Tamala Julian.

## 2019-03-10 ENCOUNTER — Other Ambulatory Visit: Payer: Self-pay | Admitting: Family Medicine

## 2019-03-10 DIAGNOSIS — I152 Hypertension secondary to endocrine disorders: Secondary | ICD-10-CM

## 2019-03-10 DIAGNOSIS — E1169 Type 2 diabetes mellitus with other specified complication: Secondary | ICD-10-CM

## 2019-03-10 DIAGNOSIS — E1159 Type 2 diabetes mellitus with other circulatory complications: Secondary | ICD-10-CM

## 2019-03-10 NOTE — Telephone Encounter (Signed)
Patient's wife called states he is completely out of Metformin--advised her OV/Appt required for refill---scheduled pt for 1st available 04/03/19 -- she requested an emergency supply to pharmacy (enough to get pt to DOS).   --Forwarding request to med asst that if approved send refill order to :   Preferred Pharmacies      [place] [place]. [phone] (Phone) [phone] (Fax   --glh

## 2019-03-12 NOTE — Progress Notes (Signed)
Cardiology Office Note:    Date:  03/13/2019   ID:  [name], DOB 1955-05-03, MRN [id number]  PCP:  Mellody Dance, DO  Cardiologist:  Sinclair Grooms, MD   Referring MD: Mellody Dance, DO   Chief Complaint  Patient presents with  . Coronary Artery Disease  . Advice Only    Diabetes mellitus    History of Present Illness:    [name] is a 64 y.o. male with a hx of CABG for severe LM disease ( LIMA to LAD, SVG to DX1 and OM 10/26/18), DM, HTN, OSA, GERD and HLD.  He is doing relatively well since coronary bypass surgery in October 2020.  He does complain that each morning he has some measure of dyspnea.  He describes this as being short of breath when he and his wife pray at the beginning of each morning.  He is not able to get a deep breath the way that he is previously accustomed.  On the other hand, he denies shortness of breath with activity, orthopnea, PND, and lower extremity swelling.  He is not having angina.  He is frustrated by inability to get the COVID-19 vaccine.  States that when he exercises his heart rate can increase to 190 to 200 bpm.  Past Medical History:  Diagnosis Date  . Carpal tunnel syndrome, bilateral   . Carpal tunnel syndrome, right    nerve impingement right arm/wears brace  . Coronary artery disease   . Diabetes mellitus    no meds  . Dyspnea   . GERD (gastroesophageal reflux disease)   . Gout   . Hepatic steatosis   . Hyperlipidemia   . Hypertension   . Obstructive sleep apnea     Past Surgical History:  Procedure Laterality Date  . CARDIAC CATHETERIZATION    . CARPAL TUNNEL RELEASE  09/10/2015   RIGHT WRIST / WITH NERVE IMPINGEMENT SURGERY  . CERVICAL FUSION     Dr Sherwood Gambler  . COLONOSCOPY  2007   negative; [facility]  . CORONARY ANGIOGRAPHY N/A 10/25/2018   Procedure: CORONARY ANGIOGRAPHY (CATH LAB);  Surgeon: Sherren Mocha, MD;  Location: [facility];  Service: Cardiovascular;  Laterality: N/A;  .  CORONARY ARTERY BYPASS GRAFT N/A 10/26/2018   Procedure: CORONARY ARTERY BYPASS GRAFTING (CABG) x3 using the LIMA to LAD and right greater saphenous vein via endoscopic harvest to the Diag 1 and OM 2.;  Surgeon: Lajuana Matte, MD;  Location: [facility];  Service: Open Heart Surgery;  Laterality: N/A;  . ESOPHAGEAL DILATION  2007  . 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 LUMBAR FUSION      Dr Sherwood Gambler    Current Medications: Current Meds  Medication Sig  . acetaminophen (TYLENOL) 500 MG tablet Take 2 tablets (1,000 mg total) by mouth every 6 (six) hours as needed.  Marland Kitchen allopurinol (ZYLOPRIM) 300 MG tablet TAKE 1/2 TABLET BY MOUTH DAILY  . Ascorbic Acid (VITAMIN C) 1000 MG tablet Take 1,000 mg by mouth daily.  Marland Kitchen aspirin 81 MG tablet Take 81 mg by mouth daily.    Marland Kitchen atorvastatin (LIPITOR) 80 MG tablet Take 1 tablet (80 mg total) by mouth at bedtime.  . blood glucose meter kit and supplies Dispense based on patient and insurance preference. Use to check fasting blood glucose in the morning and to check glucose 2 hours after largest meal of the day. (FOR ICD-10 E10.9, E11.9).  Marland Kitchen FLUoxetine (PROZAC) 40  MG capsule TAKE 1 CAPSULE BY MOUTH DAILY  . gabapentin (NEURONTIN) 800 MG tablet TAKE 1 TABLET BY MOUTH 3 TIMES DAILY  . glucose blood test strip Use as instructed  . LANCETS ULTRA FINE MISC Use to check fasting blood glucose in the morning and to check glucose 2 hours after largest meal of the day  . Multiple Vitamins-Iron (MULTIVITAMINS WITH IRON) TABS Take 1 tablet by mouth daily.  . pantoprazole (PROTONIX) 40 MG tablet TAKE 1 TABLET BY MOUTH DAILY  . polyethylene glycol powder (GLYCOLAX/MIRALAX) powder USE AS DIRECTED  . pyridoxine (B-6) 100 MG tablet Take 100 mg by mouth daily.  . traMADol (ULTRAM) 50 MG tablet Take 1-2 tablets (50-100 mg total) by mouth every 4 (four) hours as needed for moderate pain.  . traZODone (DESYREL) 50 MG tablet TAKE 1/2 TO 1 TABLET  BY MOUTH AT BEDTIMEAS NEEDED FOR SLEEP  . Vitamin D, Ergocalciferol, (DRISDOL) 1.25 MG (50000 UT) CAPS capsule TAKE 1 CAPSULE BY MOUTH EVERY 7 DAYS  . [DISCONTINUED] metFORMIN (GLUCOPHAGE-XR) 500 MG 24 hr tablet TAKE 1 TABLET BY MOUTH DAILY WITH BREAKFAST  . [DISCONTINUED] metoprolol tartrate (LOPRESSOR) 25 MG tablet Take 1 tablet (25 mg total) by mouth 2 (two) times daily.     Allergies:   Prednisone   Social History   Socioeconomic History  . Marital status: Married    Spouse name: Not on file  . Number of children: Not on file  . Years of education: Not on file  . Highest education level: Not on file  Occupational History  . Occupation: Disabled  Tobacco Use  . Smoking status: Former Smoker    Quit date: 01/06/1976    Years since quitting: 43.2  . Smokeless tobacco: Former Systems developer  . Tobacco comment: smoked 35 years ago as of 2013   Substance and Sexual Activity  . Alcohol use: No  . Drug use: No  . Sexual activity: Yes    Birth control/protection: None  Other Topics Concern  . Not on file  Social History Narrative  . Not on file   Social Determinants of Health   Financial Resource Strain:   . Difficulty of Paying Living Expenses: Not on file  Food Insecurity:   . Worried About Charity fundraiser in the Last Year: Not on file  . Ran Out of Food in the Last Year: Not on file  Transportation Needs: No Transportation Needs  . Lack of Transportation (Medical): No  . Lack of Transportation (Non-Medical): No  Physical Activity: Inactive  . Days of Exercise per Week: 0 days  . Minutes of Exercise per Session: 0 min  Stress: No Stress Concern Present  . Feeling of Stress : Only a little  Social Connections:   . Frequency of Communication with Friends and Family: Not on file  . Frequency of Social Gatherings with Friends and Family: Not on file  . Attends Religious Services: Not on file  . Active Member of Clubs or Organizations: Not on file  . Attends Archivist  Meetings: Not on file  . Marital Status: Not on file     Family History: The patient's family history includes Aneurysm (age of onset: 28) in his mother; Cancer in his maternal uncle and paternal uncle; Diabetes in his sister; Heart attack in his father; Heart disease in his father, sister, and sister; Kidney disease in his brother; Sudden death in his mother; Urolithiasis in his father. There is no history of Hyperlipidemia, Hypertension, or Colon  cancer.  ROS:   Please see the history of present illness.    Has mild heartburn.  Heartburn was a symptom prior to bypass.  This is not necessarily associated with the shortness of breath that he mentions.  All other systems reviewed and are negative.  EKGs/Labs/Other Studies Reviewed:    The following studies were reviewed today: No new evaluation since the last office visit  EKG:  EKG Performed in November 2020, demonstrates normal sinus rhythm, precordial T wave inversion, vertical axis.  Recent Labs: 10/27/2018: Magnesium 2.2 12/14/2018: ALT 16; BUN 22; Creatinine, Ser 1.66; Hemoglobin 12.1; Platelets 269; Potassium 4.9; Sodium 139  Recent Lipid Panel    Component Value Date/Time   CHOL 147 12/14/2018 1625   TRIG 84 12/14/2018 1625   HDL 51 12/14/2018 1625   CHOLHDL 2.9 12/14/2018 1625   CHOLHDL 3.4 10/25/2018 0320   VLDL 21 10/25/2018 0320   LDLCALC 80 12/14/2018 1625    Physical Exam:    VS:  BP 118/80   Pulse (!) 53   Ht '5\' 11"'  (1.803 m)   Wt 235 lb 3.2 oz (106.7 kg)   SpO2 98%   BMI 32.80 kg/m     Wt Readings from Last 3 Encounters:  03/13/19 235 lb 3.2 oz (106.7 kg)  12/16/18 229 lb (103.9 kg)  12/14/18 229 lb 1.9 oz (103.9 kg)     GEN: Relatively young in appearance and slightly overweight. No acute distress HEENT: Normal NECK: No JVD. LYMPHATICS: No lymphadenopathy CARDIAC:  RRR without murmur, gallop, or edema. VASCULAR:  Normal Pulses. No bruits. RESPIRATORY:  Clear to auscultation without rales, wheezing  or rhonchi  ABDOMEN: Soft, non-tender, non-distended, No pulsatile mass, MUSCULOSKELETAL: No deformity  SKIN: Warm and dry NEUROLOGIC:  Alert and oriented x 3 PSYCHIATRIC:  Normal affect   ASSESSMENT:    1. S/P CABG (coronary artery bypass graft)   2. Pure hypercholesterolemia   3. Essential hypertension   4. Renal insufficiency   5. 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 Educated about COVID-19 virus infection    PLAN:    In order of problems listed above:  1. Denies angina.  Has shortness of breath first thing in the morning when he awakens.  Secondary prevention is discussed.  Preventive therapy including ACE inhibitor/ARB and SGLT2 discussed. 2. Continue atorvastatin 40 mg/day 3. Decrease metoprolol to succinate 25 mg once per day 4. Bmet is obtained today.  Resume ACE inhibitor therapy based upon creatinine.  The most recent creatinine was 1.7, 3- months ago.  He had previously been on benazepril 20 mg prior to surgery. 5. Start Jardiance 10 mg/day.  This is for both blood sugar control and to help provide cardioprotection. 6. 3W's.  COVID-19 vaccine is being sought.  Overall education and awareness concerning primary/secondary risk prevention was discussed in detail: LDL less than 70, hemoglobin A1c less than 7, blood pressure target less than 130/80 mmHg, >150 minutes of moderate aerobic activity per week, avoidance of smoking, weight control (via diet and exercise), and continued surveillance/management of/for obstructive sleep apnea.  Significant changes today: Decrease metoprolol, resume ACE inhibitor therapy if kidney function allows, and add SGLT2 for blood sugar control and cardioprotection.   Medication Adjustments/Labs and Tests Ordered: Current medicines are reviewed at length with the patient today.  Concerns regarding medicines are outlined above.  Orders Placed This Encounter  Procedures  .  Basic metabolic panel  . Basic  metabolic panel   Meds ordered this encounter  Medications  . metoprolol succinate (TOPROL-XL) 25 MG 24 hr tablet    Sig: Take 1 tablet (25 mg total) by mouth daily.    Dispense:  90 tablet    Refill:  3    D/c Metoprolol tartrate  . benazepril (LOTENSIN) 10 MG tablet    Sig: Take 1 tablet (10 mg total) by mouth daily.    Dispense:  90 tablet    Refill:  3  . empagliflozin (JARDIANCE) 10 MG TABS tablet    Sig: Take 10 mg by mouth daily before breakfast.    Dispense:  30 tablet    Refill:  2    Further refills will need to come from PCP    Patient Instructions  Medication Instructions:  1) DISCONTINUE Metoprolol Tartrate 2) START Metoprolol Succinate 38m once daily 3) START Benazepril 172monce daily 4) START Jardiance 1068mnce daily  *If you need a refill on your cardiac medications before your next appointment, please call your pharmacy*   Lab Work: BMET today  BMET in 2 weeks    If you have labs (blood work) drawn today and your tests are completely normal, you will receive your results only by: . MMarland KitchenChart Message (if you have MyChart) OR . A paper copy in the mail If you have any lab test that is abnormal or we need to change your treatment, we will call you to review the results.   Testing/Procedures: None   Follow-Up: [facility] and your health needs are our priority.  As part of our continuing mission to provide you with exceptional heart care, we have created designated Provider Care Teams.  These Care Teams include your primary Cardiologist (physician) and Advanced Practice Providers (APPs -  Physician Assistants and Nurse Practitioners) who all work together to provide you with the care you need, when you need it.  We recommend signing up for the patient portal called "MyChart".  Sign up information is provided on this After Visit Summary.  MyChart is used to connect with patients for Virtual Visits (Telemedicine).   Patients are able to view lab/test results, encounter notes, upcoming appointments, etc.  Non-urgent messages can be sent to your provider as well.   To learn more about what you can do with MyChart, go to [email or web address]  Your next appointment:   3 month(s)  The format for your next appointment:   In Person  Provider:   You may see HenSinclair GroomsD or one of the following Advanced Practice Providers on your designated Care Team:    LorTruitt MerleP  LauCecilie KicksP  JilKathyrn DrownP    Other Instructions      Signed, HenSinclair GroomsD  03/13/2019 9:57 AM    [place]

## 2019-03-13 ENCOUNTER — Encounter: Payer: Self-pay | Admitting: Interventional Cardiology

## 2019-03-13 ENCOUNTER — Ambulatory Visit: Payer: PPO | Admitting: Interventional Cardiology

## 2019-03-13 ENCOUNTER — Other Ambulatory Visit: Payer: Self-pay | Admitting: Family Medicine

## 2019-03-13 ENCOUNTER — Other Ambulatory Visit: Payer: Self-pay

## 2019-03-13 VITALS — BP 118/80 | HR 53 | Ht 71.0 in | Wt 235.2 lb

## 2019-03-13 DIAGNOSIS — Z951 Presence of aortocoronary bypass graft: Secondary | ICD-10-CM | POA: Diagnosis not present

## 2019-03-13 DIAGNOSIS — E1129 Type 2 diabetes mellitus with other diabetic kidney complication: Secondary | ICD-10-CM | POA: Diagnosis not present

## 2019-03-13 DIAGNOSIS — I10 Essential (primary) hypertension: Secondary | ICD-10-CM

## 2019-03-13 DIAGNOSIS — Z7189 Other specified counseling: Secondary | ICD-10-CM

## 2019-03-13 DIAGNOSIS — N289 Disorder of kidney and ureter, unspecified: Secondary | ICD-10-CM | POA: Diagnosis not present

## 2019-03-13 DIAGNOSIS — E7800 Pure hypercholesterolemia, unspecified: Secondary | ICD-10-CM

## 2019-03-13 DIAGNOSIS — G4733 Obstructive sleep apnea (adult) (pediatric): Secondary | ICD-10-CM | POA: Diagnosis not present

## 2019-03-13 LAB — BASIC METABOLIC PANEL
BUN/Creatinine Ratio: 18 (ref 10–24)
BUN: 27 mg/dL (ref 8–27)
CO2: 21 mmol/L (ref 20–29)
Calcium: 9.6 mg/dL (ref 8.6–10.2)
Chloride: 103 mmol/L (ref 96–106)
Creatinine, Ser: 1.51 mg/dL — ABNORMAL HIGH (ref 0.76–1.27)
GFR calc Af Amer: 56 mL/min/{1.73_m2} — ABNORMAL LOW (ref >59)
GFR calc non Af Amer: 48 mL/min/{1.73_m2} — ABNORMAL LOW (ref >59)
Glucose: 142 mg/dL — ABNORMAL HIGH (ref 65–99)
Potassium: 4.8 mmol/L (ref 3.5–5.2)
Sodium: 138 mmol/L (ref 134–144)

## 2019-03-13 MED ORDER — METOPROLOL SUCCINATE ER 25 MG PO TB24: 25.0000 mg | ORAL_TABLET | Freq: Every day | ORAL | 3 refills | Status: DC

## 2019-03-13 MED ORDER — METFORMIN HCL ER 500 MG PO TB24: 500.0000 mg | ORAL_TABLET | Freq: Every day | ORAL | 0 refills | Status: DC

## 2019-03-13 MED ORDER — JARDIANCE 10 MG PO TABS: 10.0000 mg | ORAL_TABLET | Freq: Every day | ORAL | 2 refills | Status: DC

## 2019-03-13 MED ORDER — BENAZEPRIL HCL 10 MG PO TABS: 10.0000 mg | ORAL_TABLET | Freq: Every day | ORAL | 3 refills | Status: DC

## 2019-03-13 NOTE — Telephone Encounter (Signed)
Patient was given 30 days on 02/06/19. Sending in 15 day refill per refill protocol. AS, CMA

## 2019-03-13 NOTE — Patient Instructions (Addendum)
Medication Instructions:  1) DISCONTINUE Metoprolol Tartrate 2) START Metoprolol Succinate 25mg  once daily 3) START Benazepril 10mg  once daily 4) START Jardiance 10mg  once daily  *If you need a refill on your cardiac medications before your next appointment, please call your pharmacy*   Lab Work: BMET today  BMET in 2 weeks    If you have labs (blood work) drawn today and your tests are completely normal, you will receive your results only by: Marland Kitchen MyChart Message (if you have MyChart) OR . A paper copy in the mail If you have any lab test that is abnormal or we need to change your treatment, we will call you to review the results.   Testing/Procedures: None   Follow-Up: At [facility], you and your health needs are our priority.  As part of our continuing mission to provide you with exceptional heart care, we have created designated Provider Care Teams.  These Care Teams include your primary Cardiologist (physician) and Advanced Practice Providers (APPs -  Physician Assistants and Nurse Practitioners) who all work together to provide you with the care you need, when you need it.  We recommend signing up for the patient portal called "MyChart".  Sign up information is provided on this After Visit Summary.  MyChart is used to connect with patients for Virtual Visits (Telemedicine).  Patients are able to view lab/test results, encounter notes, upcoming appointments, etc.  Non-urgent messages can be sent to your provider as well.   To learn more about what you can do with MyChart, go to [email or web address].    Your next appointment:   3 month(s)  The format for your next appointment:   In Person  Provider:   You may see Sinclair Grooms, MD or one of the following Advanced Practice Providers on your designated Care Team:    Truitt Merle, NP  Cecilie Kicks, NP  Kathyrn Drown, NP    Other Instructions

## 2019-03-14 DIAGNOSIS — M7061 Trochanteric bursitis, right hip: Secondary | ICD-10-CM | POA: Diagnosis not present

## 2019-03-14 DIAGNOSIS — M5416 Radiculopathy, lumbar region: Secondary | ICD-10-CM | POA: Diagnosis not present

## 2019-03-14 DIAGNOSIS — M7062 Trochanteric bursitis, left hip: Secondary | ICD-10-CM | POA: Diagnosis not present

## 2019-03-14 DIAGNOSIS — M545 Low back pain: Secondary | ICD-10-CM | POA: Diagnosis not present

## 2019-03-20 DIAGNOSIS — M4720 Other spondylosis with radiculopathy, site unspecified: Secondary | ICD-10-CM | POA: Diagnosis not present

## 2019-03-20 DIAGNOSIS — M5116 Intervertebral disc disorders with radiculopathy, lumbar region: Secondary | ICD-10-CM | POA: Diagnosis not present

## 2019-03-20 DIAGNOSIS — M5117 Intervertebral disc disorders with radiculopathy, lumbosacral region: Secondary | ICD-10-CM | POA: Diagnosis not present

## 2019-03-20 DIAGNOSIS — M4326 Fusion of spine, lumbar region: Secondary | ICD-10-CM | POA: Diagnosis not present

## 2019-03-24 ENCOUNTER — Other Ambulatory Visit: Payer: PPO | Admitting: *Deleted

## 2019-03-24 ENCOUNTER — Other Ambulatory Visit: Payer: Self-pay

## 2019-03-24 DIAGNOSIS — I10 Essential (primary) hypertension: Secondary | ICD-10-CM

## 2019-03-24 DIAGNOSIS — N289 Disorder of kidney and ureter, unspecified: Secondary | ICD-10-CM | POA: Diagnosis not present

## 2019-03-24 LAB — BASIC METABOLIC PANEL
BUN/Creatinine Ratio: 24 (ref 10–24)
BUN: 51 mg/dL — ABNORMAL HIGH (ref 8–27)
CO2: 19 mmol/L — ABNORMAL LOW (ref 20–29)
Calcium: 10 mg/dL (ref 8.6–10.2)
Chloride: 104 mmol/L (ref 96–106)
Creatinine, Ser: 2.17 mg/dL — ABNORMAL HIGH (ref 0.76–1.27)
GFR calc Af Amer: 36 mL/min/{1.73_m2} — ABNORMAL LOW (ref >59)
GFR calc non Af Amer: 31 mL/min/{1.73_m2} — ABNORMAL LOW (ref >59)
Glucose: 124 mg/dL — ABNORMAL HIGH (ref 65–99)
Potassium: 5.3 mmol/L — ABNORMAL HIGH (ref 3.5–5.2)
Sodium: 136 mmol/L (ref 134–144)

## 2019-03-28 ENCOUNTER — Other Ambulatory Visit: Payer: PPO

## 2019-04-03 ENCOUNTER — Other Ambulatory Visit: Payer: Self-pay

## 2019-04-03 ENCOUNTER — Encounter: Payer: Self-pay | Admitting: Family Medicine

## 2019-04-03 ENCOUNTER — Ambulatory Visit (INDEPENDENT_AMBULATORY_CARE_PROVIDER_SITE_OTHER): Payer: PPO | Admitting: Family Medicine

## 2019-04-03 VITALS — BP 112/78 | HR 97 | Temp 97.8°F | Resp 14 | Ht 71.0 in | Wt 228.3 lb

## 2019-04-03 DIAGNOSIS — N183 Chronic kidney disease, stage 3 unspecified: Secondary | ICD-10-CM | POA: Diagnosis not present

## 2019-04-03 DIAGNOSIS — R809 Proteinuria, unspecified: Secondary | ICD-10-CM

## 2019-04-03 DIAGNOSIS — I152 Hypertension secondary to endocrine disorders: Secondary | ICD-10-CM

## 2019-04-03 DIAGNOSIS — G4700 Insomnia, unspecified: Secondary | ICD-10-CM | POA: Diagnosis not present

## 2019-04-03 DIAGNOSIS — E1129 Type 2 diabetes mellitus with other diabetic kidney complication: Secondary | ICD-10-CM

## 2019-04-03 DIAGNOSIS — L84 Corns and callosities: Secondary | ICD-10-CM | POA: Diagnosis not present

## 2019-04-03 DIAGNOSIS — E785 Hyperlipidemia, unspecified: Secondary | ICD-10-CM | POA: Diagnosis not present

## 2019-04-03 DIAGNOSIS — K219 Gastro-esophageal reflux disease without esophagitis: Secondary | ICD-10-CM | POA: Diagnosis not present

## 2019-04-03 DIAGNOSIS — E1169 Type 2 diabetes mellitus with other specified complication: Secondary | ICD-10-CM

## 2019-04-03 DIAGNOSIS — E1159 Type 2 diabetes mellitus with other circulatory complications: Secondary | ICD-10-CM | POA: Diagnosis not present

## 2019-04-03 DIAGNOSIS — I10 Essential (primary) hypertension: Secondary | ICD-10-CM

## 2019-04-03 LAB — POCT UA - MICROALBUMIN
Creatinine, POC: 200 mg/dL
Microalbumin Ur, POC: 80 mg/L

## 2019-04-03 LAB — POCT GLYCOSYLATED HEMOGLOBIN (HGB A1C): Hemoglobin A1C: 7.7 % — AB (ref 4.0–5.6)

## 2019-04-03 MED ORDER — VICTOZA 18 MG/3ML ~~LOC~~ SOPN: PEN_INJECTOR | SUBCUTANEOUS | 0 refills | Status: DC

## 2019-04-03 NOTE — Progress Notes (Signed)
Impression and Recommendations:    1. Type 2 diabetes mellitus with other specified complication, without long-term current use of insulin ([name])   2. Type 2 diabetes mellitus with other kidney complication, unspecified whether long term insulin use ([name])   3. Diabetes mellitus with proteinuria ([name])   4. Hypertension associated with diabetes ([name])   5. Hyperlipidemia associated with type 2 diabetes mellitus (HCC)   6. Stage 3 chronic kidney disease, unspecified whether stage 3a or 3b CKD   7. Corn or callus   8. Morbid obesity ([name])   9. Insomnia, unspecified type   10. Gastroesophageal reflux disease, unspecified whether esophagitis present      - Last OV on 12/05/2018 after CABG x 3.   Type 2 DM w/ proteinuria and other kidney complication - P4D was 7.7 last check, up from 6.6 five months ago.  - A1c currently is poorly controlled, not at goal.  - Patient's GFR last check 10 days ago was 36, down from 56 three weeks ago.   - Given patient's kidney health status, discontinue metformin today due to declining GFR.   - Reviewed patient's treatment plan extensively during appointment today, especially in terms of patient's BMI, kidney health, and cardiac health.  Discussed several options to modify treatment.  Extensive education provided and all questions answered.  - Per patient, no known family history of thyroid cancer. - Per patient, no known personal history of pancreatitis.  - Begin low-dose Liraglutide/Victoza today.  - Counseled patient on pathophysiology of diabetes and discussed various treatment options, which always includes dietary and lifestyle modification as first line.    - Importance of low carb, heart-healthy diet discussed with patient in addition to regular aerobic exercise of 65mn 5d/week or more.   - Check FBS and 2 hours after the biggest meal of your day.  Keep log and bring in next OV for my review.     - Also told patient if you ever feel  poorly, please check your blood pressure and blood sugar, as one or the other could be the cause of your symptoms.  - Pt reminded about need for yearly eye and foot exams.  Told patient to make appt.for diabetic eye exam, CMAs here will do foot exams  - We will continue to monitor.   Hypertension associated with DM - Patient is followed by cardiology.  - Patient's cardiologist recently started benazepril, which caused the patient's serum creatinine to spike, so benazepril was stopped. Need for re-check BMP with GFR today.  - Per patient, has been off of benazepril for approximately 10 days.  - Blood pressure is poorly controlled on intake today. - Per patient, BP is stable and well-controlled at home.  - Patient will continue current treatment regimen.  See med list.  - Counseled patient on pathophysiology of disease and discussed various treatment options, which always includes dietary and lifestyle modification as first line.   - Lifestyle changes such as dash and heart healthy diets and engaging in a regular exercise program discussed extensively with patient.   - Ambulatory blood pressure monitoring encouraged at least 3 times weekly.  Keep log and bring in every office visit.  Reminded patient that if they ever feel poorly in any way, to check their blood pressure and pulse.  - We will continue to monitor.   Stage 3 CKD, DM w/ Proteinuria - Patient is followed by nephrology. - Per patient, has an appointment scheduled near future.  -  Advised patient to update his nephrologist regarding his recent experience with benazepril, discontinuing metformin, and beginning Victoza to help protect the kidneys.  - Advised patient to ask his nephrologist regarding blood pressure medications.  - Discussed that patient's urine today returned with an albumin/creatinine ratio of 30-300. - To help reduce this value, discussed critical need to control patient's blood sugar and blood  pressure.  - Will continue to monitor alongside nephrologist.   Hyperlipidemia associated with type 2 DM - FLP stable last check 3 months ago. - Pt will continue current treatment regimen.  See med list.  - Dietary changes such as low saturated & trans fat diets for hyperlipidemia and low carb diets for hypertriglyceridemia discussed with patient.    - Encouraged patient to follow AHA guidelines for regular exercise and also engage in weight loss if BMI above 25.   - We will continue to monitor and re-check as discussed.   Insomnia - Per patient, discontinued trazodone. - Stable at this time. - Prudent sleep hygiene discussed. - Will continue to monitor.   GERD - Stable at this time. - Continue management as established.  See med list. -  Encouraged patient to work on weaning down on pantoprazole dosage if at all possible. - Will continue to monitor.   Corn or Callus - Ambulatory referral to podiatry placed today for corns and calluses that need attention, along with diabetic foot exam.  - Will continue to monitor.   BMI Counseling - Morbid Obesity, Body mass index is 31.84 kg/m Explained to patient what BMI refers to, and what it means medically.    Told patient to think about it as a "medical risk stratification measurement" and how increasing BMI is associated with increasing risk/ or worsening state of various diseases such as hypertension, hyperlipidemia, diabetes, premature OA, depression etc.  American Heart Association guidelines for healthy diet, basically Mediterranean diet, and exercise guidelines of 30 minutes 5 days per week or more discussed in detail.  - Discussed goal BMI of <30, to help reduce health risks.  Health counseling performed.  All questions answered.   Lifestyle & Preventative Health Maintenance - Advised patient to continue working toward exercising to improve overall mental, physical, and emotional health.    - Reviewed the "spokes of  the wheel" of wellbeing.  Stressed the importance of ongoing prudent habits, including regular exercise, appropriate sleep hygiene, healthful dietary habits, and prayer/meditation to relax.  - Encouraged patient to engage in daily physical activity as tolerated, especially a formal exercise routine.  Recommended that the patient eventually strive for at least 150 minutes of moderate cardiovascular activity per week according to guidelines established by the [facility].   - Healthy dietary habits encouraged, including low-carb, and high amounts of lean protein in diet.   - Patient should also consume adequate amounts of water.     Orders Placed This Encounter  Procedures  . Basic metabolic panel  . Ambulatory referral to Podiatry  . POCT UA - Microalbumin  . POCT glycosylated hemoglobin (Hb A1C)    Meds ordered this encounter  Medications  . liraglutide (VICTOZA) 18 MG/3ML SOPN    Sig: Start 0.69m SQ once a day for 7 days, then increase to 1.212monce a day if BS not at goal    Dispense:  4 pen    Refill:  0    Medications Discontinued During This Encounter  Medication Reason  . benazepril (LOTENSIN) 10 MG tablet Error  . metFORMIN (GLUCOPHAGE-XR) 500 MG  24 hr tablet Side effect (s)  . traZODone (DESYREL) 50 MG tablet       Please see AVS handed out to patient at the end of our visit for further patient instructions/ counseling done pertaining to today's office visit.   Return for f/up in one month since d/c metformin and starting victoza.     Note:  This note was prepared with assistance of Dragon voice recognition software. Occasional wrong-word or sound-a-like substitutions may have occurred due to the inherent limitations of voice recognition software.   The [place] was signed into law in 2016 which includes the topic of electronic health records.  This provides immediate access to information in MyChart.  This includes consultation notes, operative notes, office  notes, lab results and pathology reports.  If you have any questions about what you read please let us know at your next visit or call us at the office.  We are right here with you.   This case required medical decision making of at least moderate complexity.  This document serves as a record of services personally performed by Mellody Dance, DO. It was created on her behalf by [name], a trained medical scribe. The creation of this record is based on the scribe's personal observations and the provider's statements to them.    The above documentation from [name], medical scribe, has been reviewed by Marjory Sneddon, D.O.       --------------------------------------------------------------------------------------------------------------------------------------------------------------------------------------------------------------------------------------------    Subjective:     [name], am serving as scribe for Dr.Dhruti Ghuman.   HPI: [name] is a 64 y.o. male who presents to [facility] today for issues as discussed below.  States "I've been hanging in there."  Overall, states he's feeling emotionally okay; "I feel good."   - Mood Management & Physical Activity Feels good, stable at the 40 mg of Prozac.  In terms of lifestyle, notes he's been "real busy doing projects for different people and stuff."  Says "this week I walked extremely a lot," engaging in activity for himself.  On average per day, believes he was walking about 30 minutes.   - Weight Gain after CABG x 3 Patient is up to 228 lbs from 214 lbs back on 10/31/2018.  Says after his CABG x 3, "it's been hard to balance," and he's been through a lot of family stress in addition to his own concerns.  He feels he's working on losing the weight, and wants to follow whatever treatment plan is best for his health.   - Family Stress His sister had  open heart surgery too, for a blocked artery; her heart rate went down into the 30's and they are going to place a pacemaker.  His brother passed a couple of months ago.   - Sleep; weaning off of trazodone He has been weaning himself off of trazodone because he felt "I've been on it too long and I don't like the way it makes me feel sometimes."  Says "the first week was horrible, and I'm doing a little better now."   - GERD He continues pantoprazole 40 mg.    Says he had a couple of episodes of heartburn that he thought were chest pain, so he decided he better continue taking medication.   - Toe concerns, corn on foot Notes he has a "toe problem;" a corn that's been there for a long time and hurts very badly.    HPI:  Diabetes Mellitus:  Home glucose readings:  After his surgery, was running 170-180, now down to 140, 130's.  Says "it's coming back from after the surgery."  Notes he knew his sugar would go up after his CABG x 3.   - Patient reports good compliance with therapy plan: medication and/or lifestyle modification  He has been out of metformin this week.  - His denies acute concerns or problems related to treatment plan  - He denies new concerns.  Denies polyuria/polydipsia, hypo/ hyperglycemia symptoms.  Denies new onset of: chest pain, exercise intolerance, shortness of breath, dizziness, visual changes, headache, lower extremity swelling or claudication.   Last A1C in the office was:  Lab Results  Component Value Date   HGBA1C 7.7 (A) 04/03/2019   HGBA1C 6.6 (H) 10/26/2018   HGBA1C 6.6 (H) 08/30/2018   Lab Results  Component Value Date   MICROALBUR 80 04/03/2019   LDLCALC 80 12/14/2018   CREATININE 2.17 (H) 03/24/2019   BP Readings from Last 3 Encounters:  04/03/19 112/78  03/13/19 118/80  12/16/18 133/80   Wt Readings from Last 3 Encounters:  04/03/19 228 lb 4.8 oz (103.6 kg)  03/13/19 235 lb 3.2 oz (106.7 kg)  12/16/18 229 lb (103.9 kg)      HPI:  Hypertension:  -  His blood pressure at home has been running: 117/79 yesterday, and well-controlled at home.  Thinks today his BP may be elevated due to worrying about his sister in the hospital.  - Patient reports good compliance with medication and/or lifestyle modification.  He's been off of benazepril for 10 days.  - His denies acute concerns or problems related to treatment plan  - He denies new onset of: chest pain, exercise intolerance, shortness of breath, dizziness, visual changes, headache, lower extremity swelling or claudication.   Last 3 blood pressure readings in our office are as follows: BP Readings from Last 3 Encounters:  04/03/19 112/78  03/13/19 118/80  12/16/18 133/80   Filed Weights   04/03/19 0911  Weight: 228 lb 4.8 oz (103.6 kg)     HPI:  Hyperlipidemia:  64 y.o. male here for cholesterol follow-up.   - Patient reports good compliance with treatment plan of:  medication and/ or lifestyle management.  He continues his Lipitor before bed.  - Patient denies any acute concerns or problems with management plan   - He denies new onset of: myalgias, arthralgias, increased fatigue more than normal, chest pains, exercise intolerance, shortness of breath, dizziness, visual changes, headache, lower extremity swelling or claudication.   Most recent cholesterol panel was:  Lab Results  Component Value Date   CHOL 147 12/14/2018   HDL 51 12/14/2018   LDLCALC 80 12/14/2018   TRIG 84 12/14/2018   CHOLHDL 2.9 12/14/2018   Hepatic Function Latest Ref Rng & Units 12/14/2018 10/24/2018 08/30/2018  Total Protein 6.0 - 8.5 g/dL 7.1 8.2(H) 6.9  Albumin 3.8 - 4.8 g/dL 4.5 4.4 4.3  AST 0 - 40 IU/L 19 41 25  ALT 0 - 44 IU/L '16 28 19  ' Alk Phosphatase 39 - 117 IU/L 114 73 86  Total Bilirubin 0.0 - 1.2 mg/dL 0.4 0.8 0.3  Bilirubin, Direct 0.00 - 0.40 mg/dL 0.12 0.2 -       Wt Readings from Last 3 Encounters:  04/03/19 228 lb 4.8 oz (103.6 kg)   03/13/19 235 lb 3.2 oz (106.7 kg)  12/16/18 229 lb (103.9 kg)   BP Readings from Last 3 Encounters:  04/03/19 112/78  03/13/19 118/80  12/16/18 133/80   Pulse Readings from Last 3 Encounters:  04/03/19 97  03/13/19 (!) 53  12/16/18 (!) 56   BMI Readings from Last 3 Encounters:  04/03/19 31.84 kg/m  03/13/19 32.80 kg/m  12/16/18 31.94 kg/m     Patient Care Team    Relationship Specialty Notifications Start End  Mellody Dance, DO PCP - General Family Medicine  10/28/16   Belva Crome, MD PCP - Cardiology Cardiology Admissions 12/12/18   Rutherford Guys, MD Consulting Physician Ophthalmology  11/23/14   Jovita Gamma, MD Consulting Physician Neurosurgery  11/23/14   Kathie Rhodes, MD Consulting Physician Urology  12/07/14   Milus Banister, MD Attending Physician Gastroenterology  12/07/14   Chesley Mires, MD Consulting Physician Pulmonary Disease  12/07/16   Garald Balding, MD Consulting Physician Orthopedic Surgery  12/07/16    Comment: back and hip  Dene Gentry, MD Consulting Physician Sports Medicine  12/07/16    Comment: back and hip pain  Corliss Parish, MD Consulting Physician Nephrology  11/08/17    Comment: Sees her every 6 months  Lajuana Matte, MD Consulting Physician Cardiothoracic Surgery  12/10/18   Burtis Junes, NP Nurse Practitioner Nurse Practitioner  12/10/18    Comment: works with Dr Lauro Regulus, Lynnell Dike, MD Consulting Physician Cardiology  12/10/18      Patient Active Problem List   Diagnosis Date Noted  . Hypertension associated with diabetes ([place]) 12/01/2011  . Diabetes mellitus with renal manifestation ([place]) 06/11/2006  . Hyperlipidemia associated with type 2 diabetes mellitus ([place]) 05/26/2006  . OBSTRUCTIVE SLEEP APNEA 05/26/2006  . Attention deficit hyperactivity disorder (ADHD), predominantly inattentive type 07/26/2017  . Overweight (BMI 25.0-29.9) 04/25/2017  . Prostatitis 04/20/2014  . Mood disorder ([place])  03/29/2013  . Renal insufficiency 12/01/2011  . 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 Vitamin D deficiency 11/30/2007  . Corn or callus 04/03/2019  . Morbid obesity ([place]) 04/03/2019  . Diabetes mellitus with proteinuria ([place]) 04/03/2019  . Gastroesophageal reflux disease 04/03/2019  . Insomnia 04/03/2019  . Sleep disorder 12/05/2018  . CAD (coronary artery disease) 10/26/2018  . 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 Coronary artery disease involving native coronary artery of native heart with unstable angina pectoris ([place])   . Chest pain 10/24/2018  . Back pain 09/26/2018  . Type 2 diabetes mellitus with renal complication ([place]) 75/64/3329  . Diabetes mellitus ([place]) 02/08/2018  . Chronic kidney disease (CKD), stage III (moderate) 02/08/2018  . Tinea corporis 11/08/2017  . Chronic left hip pain 07/26/2017  . Facet arthropathy, lumbosacral 07/26/2017  . DDD (degenerative disc disease), lumbosacral 07/26/2017  . 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 Noncompliance 03/24/2017  . Abdominal bloating 05/15/2015  . UTI (urinary tract infection) 04/20/2014  . Hyperglycemia 04/20/2014  . Fatigue 03/07/2014  . Family history of early CAD 03/07/2014  . Maxillary sinusitis, acute 12/06/2013  . Tinea versicolor 12/06/2013  . Physical exam 11/06/2013  . Other malaise and fatigue 03/29/2013  . Decreased libido 03/29/2013  . Hematuria 02/17/2013  . Right elbow pain 12/15/2012  . Pustular folliculitis 51/88/4166  . Left ankle pain 03/15/2012  . SPINAL STENOSIS, LUMBAR 08/02/2008  . GOUT 11/30/2007  . BPH (benign prostatic hyperplasia) 11/30/2007  . ANEMIA NEC 06/11/2006  . Chronic back pain greater than 3 months duration 06/11/2006    Past Medical history, Surgical history, Family history, Social history,  Allergies and Medications have been entered into the medical record, reviewed and changed as needed.     Current Meds  Medication Sig  . acetaminophen (TYLENOL) 500 MG tablet Take 2 tablets (1,000 mg total) by mouth every 6 (six) hours as needed.  Marland Kitchen allopurinol (ZYLOPRIM) 300 MG tablet TAKE 1/2 TABLET BY MOUTH DAILY  . Ascorbic Acid (VITAMIN C) 1000 MG tablet Take 1,000 mg by mouth daily.  Marland Kitchen aspirin 81 MG tablet Take 81 mg by mouth daily.    Marland Kitchen atorvastatin (LIPITOR) 80 MG tablet Take 1 tablet (80 mg total) by mouth at bedtime.  . blood glucose meter kit and supplies Dispense based on patient and insurance preference. Use to check fasting blood glucose in the morning and to check glucose 2 hours after largest meal of the day. (FOR ICD-10 E10.9, E11.9).  Marland Kitchen empagliflozin (JARDIANCE) 10 MG TABS tablet Take 10 mg by mouth daily before breakfast.  . FLUoxetine (PROZAC) 40 MG capsule TAKE 1 CAPSULE BY MOUTH DAILY  . gabapentin (NEURONTIN) 800 MG tablet TAKE 1 TABLET BY MOUTH 3 TIMES DAILY  . glucose blood test strip Use as instructed  . LANCETS ULTRA FINE MISC Use to check fasting blood glucose in the morning and to check glucose 2 hours after largest meal of the day  . metoprolol succinate (TOPROL-XL) 25 MG 24 hr tablet Take 1 tablet (25 mg total) by mouth daily.  . Multiple Vitamins-Iron (MULTIVITAMINS WITH IRON) TABS Take 1 tablet by mouth daily.  . pantoprazole (PROTONIX) 40 MG tablet TAKE 1 TABLET BY MOUTH DAILY  . polyethylene glycol powder (GLYCOLAX/MIRALAX) powder USE AS DIRECTED  . pyridoxine (B-6) 100 MG tablet Take 100 mg by mouth daily.  . traMADol (ULTRAM) 50 MG tablet Take 1-2 tablets (50-100 mg total) by mouth every 4 (four) hours as needed for moderate pain.  . Vitamin D, Ergocalciferol, (DRISDOL) 1.25 MG (50000 UT) CAPS capsule TAKE 1 CAPSULE BY MOUTH EVERY 7 DAYS  . [DISCONTINUED] metFORMIN (GLUCOPHAGE-XR) 500 MG 24 hr tablet Take 1 tablet (500 mg total) by mouth daily with breakfast.    Allergies:  Allergies  Allergen Reactions  . Prednisone Other (See Comments)    Caused  blood sugar to increase, pt will not take     Review of Systems:  A fourteen system review of systems was performed and found to be positive as per HPI.   Objective:   Blood pressure 112/78, pulse 97, temperature 97.8 F (36.6 C), temperature source Oral, resp. rate 14, height '5\' 11"'  (1.803 m), weight 228 lb 4.8 oz (103.6 kg), SpO2 100 %. Body mass index is 31.84 kg/m. General:  Well Developed, well nourished, appropriate for stated age.  Neuro:  Alert and oriented,  extra-ocular muscles intact  HEENT:  Normocephalic, atraumatic, neck supple, no carotid bruits appreciated  Skin:  no gross rash, warm, pink. Cardiac:  RRR, S1 S2 Respiratory:  ECTA B/L and A/P, Not using accessory muscles, speaking in full sentences- unlabored. Vascular:  Ext warm, no cyanosis apprec.; cap RF less 2 sec. Psych:  No HI/SI, judgement and insight good, Euthymic mood. Full Affect. Foot:  Medial aspect of his second toe with thickened callused skin.

## 2019-04-04 LAB — BASIC METABOLIC PANEL
BUN/Creatinine Ratio: 16 (ref 10–24)
BUN: 29 mg/dL — ABNORMAL HIGH (ref 8–27)
CO2: 24 mmol/L (ref 20–29)
Calcium: 9.7 mg/dL (ref 8.6–10.2)
Chloride: 104 mmol/L (ref 96–106)
Creatinine, Ser: 1.78 mg/dL — ABNORMAL HIGH (ref 0.76–1.27)
GFR calc Af Amer: 46 mL/min/{1.73_m2} — ABNORMAL LOW (ref >59)
GFR calc non Af Amer: 39 mL/min/{1.73_m2} — ABNORMAL LOW (ref >59)
Glucose: 134 mg/dL — ABNORMAL HIGH (ref 65–99)
Potassium: 5 mmol/L (ref 3.5–5.2)
Sodium: 140 mmol/L (ref 134–144)

## 2019-04-13 ENCOUNTER — Telehealth: Payer: Self-pay | Admitting: Family Medicine

## 2019-04-13 DIAGNOSIS — E1129 Type 2 diabetes mellitus with other diabetic kidney complication: Secondary | ICD-10-CM

## 2019-04-13 MED ORDER — PEN NEEDLES 31G X 8 MM MISC: 1.0000 | Freq: Every day | 0 refills | Status: DC

## 2019-04-13 NOTE — Telephone Encounter (Signed)
Pen needles sent to pharmacy. AS< CMA

## 2019-04-13 NOTE — Telephone Encounter (Signed)
Patient was started on Victoza, but is in need of an order for the needles to go with this med for insurance to pay. Can we please place an order for the needles and send it to [facility] please?

## 2019-04-13 NOTE — Addendum Note (Signed)
Addended by: Mickel Crow on: 04/13/2019 09:41 AM   Modules accepted: Orders

## 2019-04-17 ENCOUNTER — Other Ambulatory Visit: Payer: Self-pay

## 2019-04-17 ENCOUNTER — Other Ambulatory Visit: Payer: Self-pay | Admitting: Family Medicine

## 2019-04-17 ENCOUNTER — Other Ambulatory Visit: Payer: Self-pay | Admitting: Physician Assistant

## 2019-04-17 MED ORDER — ATORVASTATIN CALCIUM 80 MG PO TABS: 80.0000 mg | ORAL_TABLET | Freq: Every day | ORAL | 3 refills | Status: DC

## 2019-04-17 NOTE — Telephone Encounter (Signed)
Pt's medication was sent to pt's pharmacy as requested. Confirmation received.  °

## 2019-04-21 ENCOUNTER — Other Ambulatory Visit: Payer: Self-pay

## 2019-04-21 ENCOUNTER — Ambulatory Visit: Payer: PPO | Admitting: Podiatry

## 2019-04-21 ENCOUNTER — Encounter: Payer: Self-pay | Admitting: Podiatry

## 2019-04-21 VITALS — BP 126/75 | HR 68 | Temp 97.4°F

## 2019-04-21 DIAGNOSIS — L84 Corns and callosities: Secondary | ICD-10-CM | POA: Diagnosis not present

## 2019-04-21 DIAGNOSIS — M7751 Other enthesopathy of right foot: Secondary | ICD-10-CM | POA: Diagnosis not present

## 2019-04-21 NOTE — Patient Instructions (Signed)
Diabetes Mellitus and Foot Care Foot care is an important part of your health, especially when you have diabetes. Diabetes may cause you to have problems because of poor blood flow (circulation) to your feet and legs, which can cause your skin to:  Become thinner and drier.  Break more easily.  Heal more slowly.  Peel and crack. You may also have nerve damage (neuropathy) in your legs and feet, causing decreased feeling in them. This means that you may not notice minor injuries to your feet that could lead to more serious problems. Noticing and addressing any potential problems early is the best way to prevent future foot problems. How to care for your feet Foot hygiene  Wash your feet daily with warm water and mild soap. Do not use hot water. Then, pat your feet and the areas between your toes until they are completely dry. Do not soak your feet as this can dry your skin.  Trim your toenails straight across. Do not dig under them or around the cuticle. File the edges of your nails with an emery board or nail file.  Apply a moisturizing lotion or petroleum jelly to the skin on your feet and to dry, brittle toenails. Use lotion that does not contain alcohol and is unscented. Do not apply lotion between your toes. Shoes and socks  Wear clean socks or stockings every day. Make sure they are not too tight. Do not wear knee-high stockings since they may decrease blood flow to your legs.  Wear shoes that fit properly and have enough cushioning. Always look in your shoes before you put them on to be sure there are no objects inside.  To break in new shoes, wear them for just a few hours a day. This prevents injuries on your feet. Wounds, scrapes, corns, and calluses  Check your feet daily for blisters, cuts, bruises, sores, and redness. If you cannot see the bottom of your feet, use a mirror or ask someone for help.  Do not cut corns or calluses or try to remove them with medicine.  If you  find a minor scrape, cut, or break in the skin on your feet, keep it and the skin around it clean and dry. You may clean these areas with mild soap and water. Do not clean the area with peroxide, alcohol, or iodine.  If you have a wound, scrape, corn, or callus on your foot, look at it several times a day to make sure it is healing and not infected. Check for: ? Redness, swelling, or pain. ? Fluid or blood. ? Warmth. ? Pus or a bad smell. General instructions  Do not cross your legs. This may decrease blood flow to your feet.  Do not use heating pads or hot water bottles on your feet. They may burn your skin. If you have lost feeling in your feet or legs, you may not know this is happening until it is too late.  Protect your feet from hot and cold by wearing shoes, such as at the beach or on hot pavement.  Schedule a complete foot exam at least once a year (annually) or more often if you have foot problems. If you have foot problems, report any cuts, sores, or bruises to your health care provider immediately. Contact a health care provider if:  You have a medical condition that increases your risk of infection and you have any cuts, sores, or bruises on your feet.  You have an injury that is not   healing.  You have redness on your legs or feet.  You feel burning or tingling in your legs or feet.  You have pain or cramps in your legs and feet.  Your legs or feet are numb.  Your feet always feel cold.  You have pain around a toenail. Get help right away if:  You have a wound, scrape, corn, or callus on your foot and: ? You have pain, swelling, or redness that gets worse. ? You have fluid or blood coming from the wound, scrape, corn, or callus. ? Your wound, scrape, corn, or callus feels warm to the touch. ? You have pus or a bad smell coming from the wound, scrape, corn, or callus. ? You have a fever. ? You have a red line going up your leg. Summary  Check your feet every day  for cuts, sores, red spots, swelling, and blisters.  Moisturize feet and legs daily.  Wear shoes that fit properly and have enough cushioning.  If you have foot problems, report any cuts, sores, or bruises to your health care provider immediately.  Schedule a complete foot exam at least once a year (annually) or more often if you have foot problems. This information is not intended to replace advice given to you by your health care provider. Make sure you discuss any questions you have with your health care provider. Document Revised: 09/14/2018 Document Reviewed: 01/24/2016 Elsevier Patient Education  2020 [employer]  Corns and Calluses Corns are small areas of thickened skin that occur on the top, sides, or tip of a toe. They contain a cone-shaped core with a point that can press on a nerve below. This causes pain.  Calluses are areas of thickened skin that can occur anywhere on the body, including the hands, fingers, palms, soles of the feet, and heels. Calluses are usually larger than corns. What are the causes? Corns and calluses are caused by rubbing (friction) or pressure, such as from shoes that are too tight or do not fit properly. What increases the risk? Corns are more likely to develop in people who have misshapen toes (toe deformities), such as hammer toes. Calluses can occur with friction to any area of the skin. They are more likely to develop in people who:  Work with their hands.  Wear shoes that fit poorly, are too tight, or are high-heeled.  Have toe deformities. What are the signs or symptoms? Symptoms of a corn or callus include:  A hard growth on the skin.  Pain or tenderness under the skin.  Redness and swelling.  Increased discomfort while wearing tight-fitting shoes, if your feet are affected. If a corn or callus becomes infected, symptoms may include:  Redness and swelling that gets worse.  Pain.  Fluid, blood, or pus draining from the corn or  callus. How is this diagnosed? Corns and calluses may be diagnosed based on your symptoms, your medical history, and a physical exam. How is this treated? Treatment for corns and calluses may include:  Removing the cause of the friction or pressure. This may involve: ? Changing your shoes. ? Wearing shoe inserts (orthotics) or other protective layers in your shoes, such as a corn pad. ? Wearing gloves.  Applying medicine to the skin (topical medicine) to help soften skin in the hardened, thickened areas.  Removing layers of dead skin with a file to reduce the size of the corn or callus.  Removing the corn or callus with a scalpel or laser.  Taking   antibiotic medicines, if your corn or callus is infected.  Having surgery, if a toe deformity is the cause. Follow these instructions at home:   Take over-the-counter and prescription medicines only as told by your health care provider.  If you were prescribed an antibiotic, take it as told by your health care provider. Do not stop taking it even if your condition starts to improve.  Wear shoes that fit well. Avoid wearing high-heeled shoes and shoes that are too tight or too loose.  Wear any padding, protective layers, gloves, or orthotics as told by your health care provider.  Soak your hands or feet and then use a file or pumice stone to soften your corn or callus. Do this as told by your health care provider.  Check your corn or callus every day for symptoms of infection. Contact a health care provider if you:  Notice that your symptoms do not improve with treatment.  Have redness or swelling that gets worse.  Notice that your corn or callus becomes painful.  Have fluid, blood, or pus coming from your corn or callus.  Have new symptoms. Summary  Corns are small areas of thickened skin that occur on the top, sides, or tip of a toe.  Calluses are areas of thickened skin that can occur anywhere on the body, including the  hands, fingers, palms, and soles of the feet. Calluses are usually larger than corns.  Corns and calluses are caused by rubbing (friction) or pressure, such as from shoes that are too tight or do not fit properly.  Treatment may include wearing any padding, protective layers, gloves, or orthotics as told by your health care provider. This information is not intended to replace advice given to you by your health care provider. Make sure you discuss any questions you have with your health care provider. Document Revised: 04/13/2018 Document Reviewed: 11/04/2016 Elsevier Patient Education  2020 [employer] Inc.  

## 2019-04-21 NOTE — Progress Notes (Signed)
Subjective:  Patient ID: [name], male    DOB: [birth year],  MRN: [id number]  Chief Complaint  Patient presents with  . Diabetes    A1C  7.7  . Callouses  . Nail Problem    64 y.o. male presents with the above complaint.  Patient presents with right second digit hyperkeratotic lesion/heloma molle between the first and second digit.  Patient states that this is causing him a lot of pain especially while ambulating.  There is pressure associated with the bunion deformity as well as hammertoe contracture of second digit which is likely rubbing on each other.  This is leading to the hyperkeratotic lesion formation with pain associated with it.  Patient has tried protecting it has tried shaving it down but has not helped.  Patient is also diabetic with last A1c of 7.7.   Review of Systems: Negative except as noted in the HPI. Denies N/V/F/Ch.  Past Medical History:  Diagnosis Date  . Carpal tunnel syndrome, bilateral   . Carpal tunnel syndrome, right    nerve impingement right arm/wears brace  . Coronary artery disease   . Diabetes mellitus    no meds  . Dyspnea   . GERD (gastroesophageal reflux disease)   . Gout   . Hepatic steatosis   . Hyperlipidemia   . Hypertension   . Obstructive sleep apnea     Current Outpatient Medications:  .  acetaminophen (TYLENOL) 500 MG tablet, Take 2 tablets (1,000 mg total) by mouth every 6 (six) hours as needed., Disp: 30 tablet, Rfl: 0 .  allopurinol (ZYLOPRIM) 300 MG tablet, TAKE 1/2 TABLET BY MOUTH DAILY, Disp: 45 tablet, Rfl: 0 .  Ascorbic Acid (VITAMIN C) 1000 MG tablet, Take 1,000 mg by mouth daily., Disp: , Rfl:  .  aspirin 81 MG tablet, Take 81 mg by mouth daily.  , Disp: , Rfl:  .  atorvastatin (LIPITOR) 80 MG tablet, Take 1 tablet (80 mg total) by mouth at bedtime., Disp: 90 tablet, Rfl: 3 .  blood glucose meter kit and supplies, Dispense based on patient and insurance preference. Use to check fasting blood glucose in the morning  and to check glucose 2 hours after largest meal of the day. (FOR ICD-10 E10.9, E11.9)., Disp: 1 each, Rfl: 0 .  empagliflozin (JARDIANCE) 10 MG TABS tablet, Take 10 mg by mouth daily before breakfast., Disp: 30 tablet, Rfl: 2 .  FLUoxetine (PROZAC) 40 MG capsule, TAKE 1 CAPSULE BY MOUTH DAILY, Disp: 90 capsule, Rfl: 1 .  gabapentin (NEURONTIN) 800 MG tablet, TAKE 1 TABLET BY MOUTH 3 TIMES DAILY, Disp: 270 tablet, Rfl: 1 .  glucose blood test strip, Use as instructed, Disp: 100 each, Rfl: 12 .  Insulin Pen Needle (PEN NEEDLES) 31G X 8 MM MISC, 1 each by Does not apply route daily., Disp: 90 each, Rfl: 0 .  LANCETS ULTRA FINE MISC, Use to check fasting blood glucose in the morning and to check glucose 2 hours after largest meal of the day, Disp: 100 each, Rfl: 12 .  liraglutide (VICTOZA) 18 MG/3ML SOPN, Start 0.5m SQ once a day for 7 days, then increase to 1.262monce a day if BS not at goal, Disp: 4 pen, Rfl: 0 .  metoprolol succinate (TOPROL-XL) 25 MG 24 hr tablet, Take 1 tablet (25 mg total) by mouth daily., Disp: 90 tablet, Rfl: 3 .  Multiple Vitamins-Iron (MULTIVITAMINS WITH IRON) TABS, Take 1 tablet by mouth daily., Disp: 30 tablet, Rfl: 0 .  pantoprazole (PROTONIX)  40 MG tablet, TAKE 1 TABLET BY MOUTH DAILY, Disp: 30 tablet, Rfl: 0 .  polyethylene glycol powder (GLYCOLAX/MIRALAX) powder, USE AS DIRECTED, Disp: 850 g, Rfl: 11 .  pyridoxine (B-6) 100 MG tablet, Take 100 mg by mouth daily., Disp: , Rfl:  .  traMADol (ULTRAM) 50 MG tablet, Take 1-2 tablets (50-100 mg total) by mouth every 4 (four) hours as needed for moderate pain., Disp: 30 tablet, Rfl: 0 .  Vitamin D, Ergocalciferol, (DRISDOL) 1.25 MG (50000 UT) CAPS capsule, TAKE 1 CAPSULE BY MOUTH EVERY 7 DAYS, Disp: 12 capsule, Rfl: 10  Social History   Tobacco Use  Smoking Status Former Smoker  . Quit date: 01/06/1976  . Years since quitting: 43.3  Smokeless Tobacco Former User  Tobacco Comment   smoked 35 years ago as of 2013      Allergies  Allergen Reactions  . Prednisone Other (See Comments)    Caused blood sugar to increase, pt will not take   Objective:   Vitals:   04/21/19 0824  BP: 126/75  Pulse: 68  Temp: (!) 97.4 F (36.3 C)   There is no height or weight on file to calculate BMI. Constitutional Well developed. Well nourished.  Vascular Dorsalis pedis pulses palpable bilaterally. Posterior tibial pulses palpable bilaterally. Capillary refill normal to all digits.  No cyanosis or clubbing noted. Pedal hair growth normal.  Neurologic Normal speech. Oriented to person, place, and time. Epicritic sensation to light touch grossly present bilaterally.  Dermatologic Nails well groomed and normal in appearance. No open wounds. No skin lesions.  Orthopedic:  Pain on palpation to the hyperkeratotic lesion/heloma molle in between the first and second digit right foot.  Mild pain with range of motion of the interphalangeal joint of the right second digit.  No pain at the metatarsophalangeal joint of the second met MPJ.  No other bony abnormality is identified.  Bunion deformity noted bilaterally.   Radiographs: None Assessment:  No diagnosis found. Plan:  Patient was evaluated and treated and all questions answered.  Right second digit capsulitis/heloma molle -I explained to the patient the etiology of capsulitis with this relationship of heloma molle and hyperkeratotic lesion that is painful in nature.  Given the amount of inflammation that is present I believe be he will benefit from a steroid injection to help decrease the inflammatory component of pain.  Patient agrees with plan would like to proceed with injection. the hyperkeratotic lesion down aggressively down to healthy striated tissue.  No complication noted  Return in about 3 months (around 07/21/2019) for diabetic nail and callus trim.

## 2019-05-01 ENCOUNTER — Telehealth: Payer: Self-pay

## 2019-05-01 ENCOUNTER — Other Ambulatory Visit: Payer: Self-pay | Admitting: Family Medicine

## 2019-05-01 DIAGNOSIS — E559 Vitamin D deficiency, unspecified: Secondary | ICD-10-CM

## 2019-05-01 NOTE — Telephone Encounter (Signed)
Please call pt to schedule appt for labs only. Vitamin D level required prior to any further refills.  Charyl Bigger, CMA

## 2019-05-08 ENCOUNTER — Ambulatory Visit: Payer: PPO | Admitting: Family Medicine

## 2019-05-11 DIAGNOSIS — N183 Chronic kidney disease, stage 3 unspecified: Secondary | ICD-10-CM | POA: Diagnosis not present

## 2019-05-11 DIAGNOSIS — E1122 Type 2 diabetes mellitus with diabetic chronic kidney disease: Secondary | ICD-10-CM | POA: Diagnosis not present

## 2019-05-11 DIAGNOSIS — M109 Gout, unspecified: Secondary | ICD-10-CM | POA: Diagnosis not present

## 2019-05-11 DIAGNOSIS — E875 Hyperkalemia: Secondary | ICD-10-CM | POA: Diagnosis not present

## 2019-05-11 DIAGNOSIS — I129 Hypertensive chronic kidney disease with stage 1 through stage 4 chronic kidney disease, or unspecified chronic kidney disease: Secondary | ICD-10-CM | POA: Diagnosis not present

## 2019-05-15 ENCOUNTER — Other Ambulatory Visit: Payer: Self-pay | Admitting: Family Medicine

## 2019-05-16 DIAGNOSIS — M545 Low back pain: Secondary | ICD-10-CM | POA: Diagnosis not present

## 2019-05-16 DIAGNOSIS — M7061 Trochanteric bursitis, right hip: Secondary | ICD-10-CM | POA: Diagnosis not present

## 2019-05-16 DIAGNOSIS — M5416 Radiculopathy, lumbar region: Secondary | ICD-10-CM | POA: Diagnosis not present

## 2019-05-16 DIAGNOSIS — M47816 Spondylosis without myelopathy or radiculopathy, lumbar region: Secondary | ICD-10-CM | POA: Diagnosis not present

## 2019-05-22 ENCOUNTER — Other Ambulatory Visit: Payer: Self-pay | Admitting: Family Medicine

## 2019-05-22 DIAGNOSIS — E1129 Type 2 diabetes mellitus with other diabetic kidney complication: Secondary | ICD-10-CM

## 2019-05-22 DIAGNOSIS — E1169 Type 2 diabetes mellitus with other specified complication: Secondary | ICD-10-CM

## 2019-05-24 ENCOUNTER — Other Ambulatory Visit: Payer: Self-pay

## 2019-05-24 ENCOUNTER — Ambulatory Visit: Payer: PPO | Admitting: Pulmonary Disease

## 2019-05-24 ENCOUNTER — Encounter: Payer: Self-pay | Admitting: Pulmonary Disease

## 2019-05-24 ENCOUNTER — Telehealth: Payer: Self-pay | Admitting: Physician Assistant

## 2019-05-24 VITALS — BP 114/62 | HR 78 | Temp 97.1°F | Ht 71.5 in | Wt 232.4 lb

## 2019-05-24 DIAGNOSIS — E1129 Type 2 diabetes mellitus with other diabetic kidney complication: Secondary | ICD-10-CM

## 2019-05-24 DIAGNOSIS — Z9989 Dependence on other enabling machines and devices: Secondary | ICD-10-CM

## 2019-05-24 DIAGNOSIS — G4733 Obstructive sleep apnea (adult) (pediatric): Secondary | ICD-10-CM

## 2019-05-24 DIAGNOSIS — R809 Proteinuria, unspecified: Secondary | ICD-10-CM

## 2019-05-24 DIAGNOSIS — E1169 Type 2 diabetes mellitus with other specified complication: Secondary | ICD-10-CM

## 2019-05-24 MED ORDER — PANTOPRAZOLE SODIUM 40 MG PO TBEC: 40.0000 mg | DELAYED_RELEASE_TABLET | Freq: Every day | ORAL | 0 refills | Status: DC

## 2019-05-24 MED ORDER — VICTOZA 18 MG/3ML ~~LOC~~ SOPN: PEN_INJECTOR | SUBCUTANEOUS | 0 refills | Status: DC

## 2019-05-24 NOTE — Telephone Encounter (Signed)
Refill sent to pharmacy. AS, CMA 

## 2019-05-24 NOTE — Telephone Encounter (Signed)
Patient has an upcoming appt for a routine f/u but is currently out of his Victoza and Protonix. If approved please send to [facility]

## 2019-05-24 NOTE — Addendum Note (Signed)
Addended by: Mickel Crow on: 05/24/2019 11:21 AM   Modules accepted: Orders

## 2019-05-24 NOTE — Progress Notes (Signed)
[facility]  Chief Complaint  Patient presents with  . Follow-up    Constitutional:  BP 114/62 (BP Location: Right Arm, Cuff Size: Normal)   Pulse 78   Temp (!) 97.1 F (36.2 C) (Temporal)   Ht 5' 11.5" (1.816 m)   Wt 232 lb 6.4 oz (105.4 kg)   SpO2 98% Comment: RA  BMI 31.96 kg/m   Past Medical History:  Carpal tunnel, CAD s/p CABG, DM, GERD, Gout, Fatty liver, HLD, HTN  Summary:  [name] is a 64 y.o. male former smoker with obstructive sleep apnea.  Subjective:  He had CABG in December.  Has lost about 40 lbs since then.  Sleeping well.  No issues with mask fit.  Physical Exam:   Appearance - well kempt  ENMT - no sinus tenderness, no nasal discharge, no oral exudate, Mallampati 3  Respiratory - no wheeze, or rales  CV - regular rate and rhythm, no murmurs  GI - soft, non tender  Lymph - no adenopathy noted in neck  Ext - no edema  Skin - no rashes  Neuro - normal strength, oriented x 3  Psych - normal mood and affect  Assessment/Plan:   Obstructive sleep apnea. - he is compliant with CPAP and reports benefit - will change his auto CPAP to 10 - 17 cm H2O - he will call once he finds a travel machine he would like to purchase  Obesity. - encouraged him to keep up with weight loss efforts   A total of 22 minutes addressing patient care on the day of the visit.  Follow up:  Patient Instructions  Will have [employer] change your auto CPAP to 10 - 17 cm water  Follow up in 1 year   Signature:  Chesley Mires, MD [place] Pager: [phone] 05/24/2019, 10:11 AM  Flow Sheet    Sleep tests:  PSG 03/08/03 >> AHI 33 Auto CPAP 04/22/19 to 05/21/19 >> used on 30 of 30 nights with average 8 hrs 15 min.  Average AHI 7.1 with median CPAP 12 and 95 th percentile CPAP 15 cm H2O.  Medications:   Allergies as of 05/24/2019      Reactions   Prednisone Other (See Comments)   Caused blood  sugar to increase, pt will not take      Medication List       Accurate as of May 24, 2019 10:11 AM. If you have any questions, ask your nurse or doctor.        acetaminophen 500 MG tablet Commonly known as: TYLENOL Take 2 tablets (1,000 mg total) by mouth every 6 (six) hours as needed.   allopurinol 300 MG tablet Commonly known as: ZYLOPRIM TAKE 1/2 TABLET BY MOUTH DAILY   aspirin 81 MG tablet Take 81 mg by mouth daily.   atorvastatin 80 MG tablet Commonly known as: LIPITOR Take 1 tablet (80 mg total) by mouth at bedtime.   blood glucose meter kit and supplies Dispense based on patient and insurance preference. Use to check fasting blood glucose in the morning and to check glucose 2 hours after largest meal of the day. (FOR ICD-10 E10.9, E11.9).   FLUoxetine 40 MG capsule Commonly known as: PROZAC TAKE 1 CAPSULE BY MOUTH DAILY   gabapentin 800 MG tablet Commonly known as: NEURONTIN TAKE 1 TABLET BY MOUTH 3 TIMES DAILY   glucose blood test strip Use as instructed   Jardiance 10 MG Tabs tablet Generic drug: empagliflozin  Take 10 mg by mouth daily before breakfast.   Lancets Ultra Fine Misc Use to check fasting blood glucose in the morning and to check glucose 2 hours after largest meal of the day   metoprolol succinate 25 MG 24 hr tablet Commonly known as: TOPROL-XL Take 1 tablet (25 mg total) by mouth daily.   multivitamins with iron Tabs tablet Take 1 tablet by mouth daily.   pantoprazole 40 MG tablet Commonly known as: PROTONIX TAKE 1 TABLET BY MOUTH DAILY   Pen Needles 31G X 8 MM Misc 1 each by Does not apply route daily.   polyethylene glycol powder 17 GM/SCOOP powder Commonly known as: GLYCOLAX/MIRALAX USE AS DIRECTED   pyridoxine 100 MG tablet Commonly known as: B-6 Take 100 mg by mouth daily.   traMADol 50 MG tablet Commonly known as: ULTRAM Take 1-2 tablets (50-100 mg total) by mouth every 4 (four) hours as needed for moderate pain.     Victoza 18 MG/3ML Sopn Generic drug: liraglutide Start 0.74m SQ once a day for 7 days, then increase to 1.236monce a day if BS not at goal   vitamin C 1000 MG tablet Take 1,000 mg by mouth daily.   Vitamin D (Ergocalciferol) 1.25 MG (50000 UNIT) Caps capsule Commonly known as: DRISDOL TAKE 1 CAPSULE BY MOUTH EVERY 7 DAYS       Past Surgical History:  He  has a past surgical history that includes Finger amputation (2003); Cervical fusion; Lumbar fusion; Esophageal dilation (2007); Colonoscopy (2007); Carpal tunnel release (09/10/2015); CORONARY ANGIOGRAPHY (N/A, 10/25/2018); Coronary artery bypass graft (N/A, 10/26/2018); and Cardiac catheterization.  Family History:  His family history includes Aneurysm (age of onset: 3064in his mother; Cancer in his maternal uncle and paternal uncle; Diabetes in his sister; Heart attack in his father; Heart disease in his father, sister, and sister; Kidney disease in his brother; Sudden death in his mother; Urolithiasis in his father.  Social History:  He  reports that he quit smoking about 43 years ago. His smoking use included cigarettes. He has a 6.00 pack-year smoking history. He has quit using smokeless tobacco. He reports that he does not drink alcohol or use drugs.

## 2019-05-24 NOTE — Patient Instructions (Signed)
Will have [employer] change your auto CPAP to 10 - 17 cm water  Follow up in 1 year

## 2019-06-01 ENCOUNTER — Encounter: Payer: Self-pay | Admitting: Physician Assistant

## 2019-06-01 ENCOUNTER — Ambulatory Visit (INDEPENDENT_AMBULATORY_CARE_PROVIDER_SITE_OTHER): Payer: PPO | Admitting: Physician Assistant

## 2019-06-01 ENCOUNTER — Other Ambulatory Visit: Payer: Self-pay

## 2019-06-01 VITALS — BP 106/67 | HR 72 | Temp 98.2°F | Ht 71.5 in | Wt 234.4 lb

## 2019-06-01 DIAGNOSIS — R809 Proteinuria, unspecified: Secondary | ICD-10-CM | POA: Diagnosis not present

## 2019-06-01 DIAGNOSIS — E785 Hyperlipidemia, unspecified: Secondary | ICD-10-CM | POA: Diagnosis not present

## 2019-06-01 DIAGNOSIS — E559 Vitamin D deficiency, unspecified: Secondary | ICD-10-CM | POA: Diagnosis not present

## 2019-06-01 DIAGNOSIS — G4700 Insomnia, unspecified: Secondary | ICD-10-CM | POA: Diagnosis not present

## 2019-06-01 DIAGNOSIS — I152 Hypertension secondary to endocrine disorders: Secondary | ICD-10-CM

## 2019-06-01 DIAGNOSIS — K219 Gastro-esophageal reflux disease without esophagitis: Secondary | ICD-10-CM

## 2019-06-01 DIAGNOSIS — E1159 Type 2 diabetes mellitus with other circulatory complications: Secondary | ICD-10-CM | POA: Diagnosis not present

## 2019-06-01 DIAGNOSIS — E1169 Type 2 diabetes mellitus with other specified complication: Secondary | ICD-10-CM

## 2019-06-01 DIAGNOSIS — E1129 Type 2 diabetes mellitus with other diabetic kidney complication: Secondary | ICD-10-CM

## 2019-06-01 DIAGNOSIS — I10 Essential (primary) hypertension: Secondary | ICD-10-CM

## 2019-06-01 DIAGNOSIS — N183 Chronic kidney disease, stage 3 unspecified: Secondary | ICD-10-CM

## 2019-06-01 MED ORDER — VITAMIN D (ERGOCALCIFEROL) 1.25 MG (50000 UNIT) PO CAPS: ORAL_CAPSULE | ORAL | 1 refills | Status: DC

## 2019-06-01 NOTE — Progress Notes (Signed)
Established Patient Office Visit  Subjective:  Patient ID: [name], male    DOB: 1955/09/21  Age: 64 y.o. MRN: [id number]  CC:  Chief Complaint  Patient presents with  . Diabetes  . Hyperlipidemia  . Hypertension    HPI [name] presents for chronic follow-up on diabetes mellitus, hypertension, and hyperlipidemia. Pt states he is doing well and has no acute concerns today. He has an upcoming appointment with his cardiologist for s/p CABG.  Diabetes: Pt denies increased urination or thirst. At last visit Metformin was discontinued and he was started on Victoza. Pt reports medication compliance and tolerating well without issues. No hypoglycemic events. Checking glucose at home. FBS range in 130s. Pt follows low carbohydrate and glucose diet.   CKD, DM w/ proteinuria: Followed by Nephrology.  HTN: Pt denies chest pain, palpitations, dizziness or lower extremity swelling. Taking medication as directed without side effects. Checks BP at home and readings range in 114/67.   HLD: Pt taking medication as directed without issues. Denies side effects including myalgias and RUQ pain.   Insomnia: Doing good.        GERD: Heartburn is under control with Pantoprazole 40 mg.   Vitamin D Deficiency: Pt requests refill of vitamin D 50000 units once weekly.                           Past Medical History:  Diagnosis Date  . Carpal tunnel syndrome, bilateral   . Carpal tunnel syndrome, right    nerve impingement right arm/wears brace  . Coronary artery disease   . Diabetes mellitus    no meds  . Dyspnea   . GERD (gastroesophageal reflux disease)   . Gout   . Hepatic steatosis   . Hyperlipidemia   . Hypertension   . Obstructive sleep apnea     Past Surgical History:  Procedure Laterality Date  . CARDIAC CATHETERIZATION    . CARPAL TUNNEL RELEASE  09/10/2015   RIGHT WRIST / WITH NERVE IMPINGEMENT SURGERY  . CERVICAL FUSION     Dr Sherwood Gambler  . COLONOSCOPY  2007   negative; [facility]  . CORONARY ANGIOGRAPHY N/A 10/25/2018   Procedure: CORONARY ANGIOGRAPHY (CATH LAB);  Surgeon: Sherren Mocha, MD;  Location: [facility];  Service: Cardiovascular;  Laterality: N/A;  . CORONARY ARTERY BYPASS GRAFT N/A 10/26/2018   Procedure: CORONARY ARTERY BYPASS GRAFTING (CABG) x3 using the LIMA to LAD and right greater saphenous vein via endoscopic harvest to the Diag 1 and OM 2.;  Surgeon: Lajuana Matte, MD;  Location: [facility];  Service: Open Heart Surgery;  Laterality: N/A;  . ESOPHAGEAL DILATION  2007  . 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 LUMBAR FUSION      Dr Sherwood Gambler    Family History  Problem Relation Age of Onset  . Heart disease Father        pacer  . Urolithiasis Father   . Heart attack Father   . Aneurysm Mother 17       CNS aneurysm  . Sudden death Mother   . Diabetes Sister   . Cancer Maternal Uncle        ? primary  . Cancer Paternal Uncle        ? primary  . Kidney disease Brother   . Heart disease Sister   . Heart disease Sister   . Hyperlipidemia Neg Hx   .  Hypertension Neg Hx   . Colon cancer Neg Hx     Social History   Socioeconomic History  . Marital status: Married    Spouse name: Not on file  . Number of children: Not on file  . Years of education: Not on file  . Highest education level: Not on file  Occupational History  . Occupation: Disabled  Tobacco Use  . Smoking status: Former Smoker    Packs/day: 1.50    Years: 4.00    Pack years: 6.00    Types: Cigarettes    Quit date: 01/06/1976    Years since quitting: 43.4  . Smokeless tobacco: Former Systems developer  . Tobacco comment: smoked 35 years ago as of 2013   Substance and Sexual Activity  . Alcohol use: No  . Drug use: No  . Sexual activity: Yes    Birth control/protection: None  Other Topics Concern  . Not on file  Social History Narrative  . Not on file   Social Determinants of Health   Financial Resource  Strain:   . Difficulty of Paying Living Expenses:   Food Insecurity:   . Worried About Charity fundraiser in the Last Year:   . Arboriculturist in the Last Year:   Transportation Needs: No Transportation Needs  . Lack of Transportation (Medical): No  . Lack of Transportation (Non-Medical): No  Physical Activity: Inactive  . Days of Exercise per Week: 0 days  . Minutes of Exercise per Session: 0 min  Stress: No Stress Concern Present  . Feeling of Stress : Only a little  Social Connections:   . Frequency of Communication with Friends and Family:   . Frequency of Social Gatherings with Friends and Family:   . Attends Religious Services:   . Active Member of Clubs or Organizations:   . Attends Archivist Meetings:   Marland Kitchen Marital Status:   Intimate Partner Violence:   . Fear of Current or Ex-Partner:   . Emotionally Abused:   Marland Kitchen Physically Abused:   . Sexually Abused:     Outpatient Medications Prior to Visit  Medication Sig Dispense Refill  . acetaminophen (TYLENOL) 500 MG tablet Take 2 tablets (1,000 mg total) by mouth every 6 (six) hours as needed. 30 tablet 0  . allopurinol (ZYLOPRIM) 300 MG tablet TAKE 1/2 TABLET BY MOUTH DAILY 45 tablet 0  . Ascorbic Acid (VITAMIN C) 1000 MG tablet Take 1,000 mg by mouth daily.    Marland Kitchen aspirin 81 MG tablet Take 81 mg by mouth daily.      Marland Kitchen atorvastatin (LIPITOR) 80 MG tablet Take 1 tablet (80 mg total) by mouth at bedtime. 90 tablet 3  . blood glucose meter kit and supplies Dispense based on patient and insurance preference. Use to check fasting blood glucose in the morning and to check glucose 2 hours after largest meal of the day. (FOR ICD-10 E10.9, E11.9). 1 each 0  . empagliflozin (JARDIANCE) 10 MG TABS tablet Take 10 mg by mouth daily before breakfast. 30 tablet 2  . FLUoxetine (PROZAC) 40 MG capsule TAKE 1 CAPSULE BY MOUTH DAILY 90 capsule 1  . gabapentin (NEURONTIN) 800 MG tablet TAKE 1 TABLET BY MOUTH 3 TIMES DAILY 270 tablet 1  .  glucose blood test strip Use as instructed 100 each 12  . Insulin Pen Needle (PEN NEEDLES) 31G X 8 MM MISC 1 each by Does not apply route daily. 90 each 0  . LANCETS ULTRA FINE MISC  Use to check fasting blood glucose in the morning and to check glucose 2 hours after largest meal of the day 100 each 12  . liraglutide (VICTOZA) 18 MG/3ML SOPN Start 0.65m SQ once a day for 7 days, then increase to 1.270monce a day if BS not at goal 4 pen 0  . metoprolol succinate (TOPROL-XL) 25 MG 24 hr tablet Take 1 tablet (25 mg total) by mouth daily. 90 tablet 3  . Multiple Vitamins-Iron (MULTIVITAMINS WITH IRON) TABS Take 1 tablet by mouth daily. 30 tablet 0  . pantoprazole (PROTONIX) 40 MG tablet Take 1 tablet (40 mg total) by mouth daily. 30 tablet 0  . polyethylene glycol powder (GLYCOLAX/MIRALAX) powder USE AS DIRECTED 850 g 11  . pyridoxine (B-6) 100 MG tablet Take 100 mg by mouth daily.    . traMADol (ULTRAM) 50 MG tablet Take 1-2 tablets (50-100 mg total) by mouth every 4 (four) hours as needed for moderate pain. 30 tablet 0  . Vitamin D, Ergocalciferol, (DRISDOL) 1.25 MG (50000 UNIT) CAPS capsule TAKE 1 CAPSULE BY MOUTH EVERY 7 DAYS 4 capsule 0   No facility-administered medications prior to visit.    Allergies  Allergen Reactions  . Prednisone Other (See Comments)    Caused blood sugar to increase, pt will not take    ROS Review of Systems  A fourteen system review of systems was performed and found to be positive as per HPI.    Objective:    Physical Exam General: Well nourished, in no apparent distress. Eyes: PERRLA, EOMs, conjunctiva clr Resp: Respiratory effort- normal, ECTA B/L w/o W/R/R  Cardio: RRR w/o murmur Abdomen: no gross distention. Lymphatics:  less 2 sec cap RF M-sk: Full ROM, 5/5 strength, normal gait.  Skin: Warm, dry  Neuro: Alert, Oriented Psych: Normal affect, Insight and Judgment appropriate.   BP 106/67   Pulse 72   Temp 98.2 F (36.8 C) (Oral)   Ht 5'  11.5" (1.816 m)   Wt 234 lb 6.4 oz (106.3 kg)   SpO2 97% Comment: on RA  BMI 32.24 kg/m  Wt Readings from Last 3 Encounters:  06/06/19 235 lb 6.4 oz (106.8 kg)  06/01/19 234 lb 6.4 oz (106.3 kg)  05/24/19 232 lb 6.4 oz (105.4 kg)     Health Maintenance Due  Topic Date Due  . Hepatitis C Screening  Never done  . OPHTHALMOLOGY EXAM  10/06/2018    There are no preventive care reminders to display for this patient.  Lab Results  Component Value Date   TSH 3.370 02/09/2018   Lab Results  Component Value Date   WBC 5.4 12/14/2018   HGB 12.1 (L) 12/14/2018   HCT 38.7 12/14/2018   MCV 86 12/14/2018   PLT 269 12/14/2018   Lab Results  Component Value Date   NA 140 04/03/2019   K 5.0 04/03/2019   CO2 24 04/03/2019   GLUCOSE 134 (H) 04/03/2019   BUN 29 (H) 04/03/2019   CREATININE 1.78 (H) 04/03/2019   BILITOT 0.4 12/14/2018   ALKPHOS 114 12/14/2018   AST 19 12/14/2018   ALT 16 12/14/2018   PROT 7.1 12/14/2018   ALBUMIN 4.5 12/14/2018   CALCIUM 9.7 04/03/2019   ANIONGAP 10 10/31/2018   GFR 58.84 (L) 04/17/2016   Lab Results  Component Value Date   CHOL 147 12/14/2018   Lab Results  Component Value Date   HDL 51 12/14/2018   Lab Results  Component Value Date   LDLCALC 80  12/14/2018   Lab Results  Component Value Date   TRIG 84 12/14/2018   Lab Results  Component Value Date   CHOLHDL 2.9 12/14/2018   Lab Results  Component Value Date   HGBA1C 7.7 (A) 04/03/2019      Assessment & Plan:   Problem List Items Addressed This Visit      Cardiovascular and Mediastinum   Hypertension associated with diabetes ([place]) (Chronic)     Digestive   Gastroesophageal reflux disease     Endocrine   Hyperlipidemia associated with type 2 diabetes mellitus (HCC) (Chronic)   Diabetes mellitus ([place]) - Primary     Genitourinary   Chronic kidney disease (CKD), stage III (moderate)     Other   Vitamin D deficiency   Relevant Medications   Vitamin D,  Ergocalciferol, (DRISDOL) 1.25 MG (50000 UNIT) CAPS capsule   Other Relevant Orders   Vitamin D (25 hydroxy) (Completed)   Insomnia      Meds ordered this encounter  Medications  . Vitamin D, Ergocalciferol, (DRISDOL) 1.25 MG (50000 UNIT) CAPS capsule    Sig: TAKE 1 CAPSULE BY MOUTH EVERY 7 DAYS    Dispense:  4 capsule    Refill:  1   Type 2 Diabetes Mellitus: - Most recent A1c 7.7, above goal and it's too soon to recheck. - Continue Jardiance and Victoza -Continue ambulatory glucose monitoring and notify clinic if FBS consistently <80 or >160. - Encourage to follow low glucose/carbohydrate diet and stay as active as possible.  HTN associated with diabetes: - BP today is 106/67 HR 72, at goal. - Continue Metoprolol 25 mg - Continue ambulatory BP and pulse monitoring . - Continue DASH diet. - Plan to recheck CMP at next OV.  HLD associated with type 2 diabetes mellitus: - Last lipid panel wnl's. - Continue Lipitor. - Follow heart healthy diet and stay as active as possible.  - Plan to recheck lipid panel and hepatic function at next OV.  CKD, Diabetes Mellitus with proteinuria: - Continue to follow-up with Nephrology. - Last creatinine 1.78, GFR 46 and has improved from prior. - Stay well hydrated  Insomnia: - stable - Encourage good sleep hygiene and relaxation techniques.   GERD: - stable - Continue Pantoprazole.  Vitamin D deficiency: - Last Vitamin D 70.7 - Will recheck Vitamin D today - Provided refill of Vitamin D.   Follow-up: Return in about 3 months (around 09/01/2019) for DM, HTN, HLD, insomnia .    Lorrene Reid, PA-C

## 2019-06-01 NOTE — Patient Instructions (Signed)

## 2019-06-02 LAB — VITAMIN D 25 HYDROXY (VIT D DEFICIENCY, FRACTURES): Vit D, 25-Hydroxy: 71.1 ng/mL (ref 30.0–100.0)

## 2019-06-06 ENCOUNTER — Ambulatory Visit: Payer: PPO | Admitting: Interventional Cardiology

## 2019-06-06 ENCOUNTER — Encounter: Payer: Self-pay | Admitting: Interventional Cardiology

## 2019-06-06 ENCOUNTER — Other Ambulatory Visit: Payer: Self-pay

## 2019-06-06 VITALS — BP 108/62 | HR 70 | Ht 71.5 in | Wt 235.4 lb

## 2019-06-06 DIAGNOSIS — Z7189 Other specified counseling: Secondary | ICD-10-CM | POA: Diagnosis not present

## 2019-06-06 DIAGNOSIS — N289 Disorder of kidney and ureter, unspecified: Secondary | ICD-10-CM

## 2019-06-06 DIAGNOSIS — E1129 Type 2 diabetes mellitus with other diabetic kidney complication: Secondary | ICD-10-CM | POA: Diagnosis not present

## 2019-06-06 DIAGNOSIS — I10 Essential (primary) hypertension: Secondary | ICD-10-CM

## 2019-06-06 DIAGNOSIS — Z951 Presence of aortocoronary bypass graft: Secondary | ICD-10-CM | POA: Diagnosis not present

## 2019-06-06 DIAGNOSIS — E7800 Pure hypercholesterolemia, unspecified: Secondary | ICD-10-CM

## 2019-06-06 NOTE — Progress Notes (Signed)
Cardiology Office Note:    Date:  06/06/2019   ID:  [name], DOB 01-Jan-1956, MRN [id number]  PCP:  Lorrene Reid, PA-C  Cardiologist:  Sinclair Grooms, MD   Referring MD: Mellody Dance, DO   Chief Complaint  Patient presents with  . Coronary Artery Disease    History of Present Illness:    [name] is a 64 y.o. male with a hx of CABG forsevere LM disease (LIMA to LAD, SVG to DX1 and OM 10/26/18),DM, HTN, OSA, GERD and HLD.  Says he feels back to normal.  He denies angina.  There have been no palpitations and he denies orthopnea and PND.  He has occasional left parasternal soreness that is markedly improved compared to prior.  He now has all of his medications and is taking those as prescribed.  There was a timeframe when he was off atorvastatin but now back on therapy.  Past Medical History:  Diagnosis Date  . Carpal tunnel syndrome, bilateral   . Carpal tunnel syndrome, right    nerve impingement right arm/wears brace  . Coronary artery disease   . Diabetes mellitus    no meds  . Dyspnea   . GERD (gastroesophageal reflux disease)   . Gout   . Hepatic steatosis   . Hyperlipidemia   . Hypertension   . Obstructive sleep apnea     Past Surgical History:  Procedure Laterality Date  . CARDIAC CATHETERIZATION    . CARPAL TUNNEL RELEASE  09/10/2015   RIGHT WRIST / WITH NERVE IMPINGEMENT SURGERY  . CERVICAL FUSION     Dr Sherwood Gambler  . COLONOSCOPY  2007   negative;  [facility]  . CORONARY ANGIOGRAPHY N/A 10/25/2018   Procedure: CORONARY ANGIOGRAPHY (CATH LAB);  Surgeon: Sherren Mocha, MD;  Location: [facility];  Service: Cardiovascular;  Laterality: N/A;  . CORONARY ARTERY BYPASS GRAFT N/A 10/26/2018   Procedure: CORONARY ARTERY BYPASS GRAFTING (CABG) x3 using the LIMA to LAD and right greater saphenous vein via endoscopic harvest to the Diag 1 and OM 2.;  Surgeon: Lajuana Matte, MD;  Location: [facility];  Service: Open Heart Surgery;   Laterality: N/A;  . ESOPHAGEAL DILATION  2007  . 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 LUMBAR FUSION      Dr Sherwood Gambler    Current Medications: Current Meds  Medication Sig  . acetaminophen (TYLENOL) 500 MG tablet Take 2 tablets (1,000 mg total) by mouth every 6 (six) hours as needed.  Marland Kitchen allopurinol (ZYLOPRIM) 300 MG tablet TAKE 1/2 TABLET BY MOUTH DAILY  . Ascorbic Acid (VITAMIN C) 1000 MG tablet Take 1,000 mg by mouth daily.  Marland Kitchen aspirin 81 MG tablet Take 81 mg by mouth daily.    Marland Kitchen atorvastatin (LIPITOR) 80 MG tablet Take 1 tablet (80 mg total) by mouth at bedtime.  . blood glucose meter kit and supplies Dispense based on patient and insurance preference. Use to check fasting blood glucose in the morning and to check glucose 2 hours after largest meal of the day. (FOR ICD-10 E10.9, E11.9).  Marland Kitchen empagliflozin (JARDIANCE) 10 MG TABS tablet Take 10 mg by mouth daily before breakfast.  . FLUoxetine (PROZAC) 40 MG capsule TAKE 1 CAPSULE BY MOUTH DAILY  . gabapentin (NEURONTIN) 800 MG tablet TAKE 1 TABLET BY MOUTH 3 TIMES DAILY  . glucose blood test strip Use as instructed  . Insulin Pen Needle (PEN NEEDLES) 31G X 8 MM MISC 1  each by Does not apply route daily.  Marland Kitchen LANCETS ULTRA FINE MISC Use to check fasting blood glucose in the morning and to check glucose 2 hours after largest meal of the day  . liraglutide (VICTOZA) 18 MG/3ML SOPN Start 0.55m SQ once a day for 7 days, then increase to 1.290monce a day if BS not at goal  . metoprolol succinate (TOPROL-XL) 25 MG 24 hr tablet Take 1 tablet (25 mg total) by mouth daily.  . Multiple Vitamins-Iron (MULTIVITAMINS WITH IRON) TABS Take 1 tablet by mouth daily.  . pantoprazole (PROTONIX) 40 MG tablet Take 1 tablet (40 mg total) by mouth daily.  . polyethylene glycol powder (GLYCOLAX/MIRALAX) powder USE AS DIRECTED  . pyridoxine (B-6) 100 MG tablet Take 100 mg by mouth daily.  . traMADol (ULTRAM) 50 MG tablet Take 1-2  tablets (50-100 mg total) by mouth every 4 (four) hours as needed for moderate pain.  . Vitamin D, Ergocalciferol, (DRISDOL) 1.25 MG (50000 UNIT) CAPS capsule TAKE 1 CAPSULE BY MOUTH EVERY 7 DAYS     Allergies:   Prednisone   Social History   Socioeconomic History  . Marital status: Married    Spouse name: Not on file  . Number of children: Not on file  . Years of education: Not on file  . Highest education level: Not on file  Occupational History  . Occupation: Disabled  Tobacco Use  . Smoking status: Former Smoker    Packs/day: 1.50    Years: 4.00    Pack years: 6.00    Types: Cigarettes    Quit date: 01/06/1976    Years since quitting: 43.4  . Smokeless tobacco: Former [employer] developer. Tobacco comment: smoked 35 years ago as of 2013   Substance and Sexual Activity  . Alcohol use: No  . Drug use: No  . Sexual activity: Yes    Birth control/protection: None  Other Topics Concern  . Not on file  Social History Narrative  . Not on file   Social Determinants of Health   Financial Resource Strain:   . Difficulty of Paying Living Expenses:   Food Insecurity:   . Worried About RuCharity fundraisern the Last Year:   . RaArboriculturistn the Last Year:   Transportation Needs: No Transportation Needs  . Lack of Transportation (Medical): No  . Lack of Transportation (Non-Medical): No  Physical Activity: Inactive  . Days of Exercise per Week: 0 days  . Minutes of Exercise per Session: 0 min  Stress: No Stress Concern Present  . Feeling of Stress : Only a little  Social Connections:   . Frequency of Communication with Friends and Family:   . Frequency of Social Gatherings with Friends and Family:   . Attends Religious Services:   . Active Member of Clubs or Organizations:   . Attends ClArchivisteetings:   . Marland Kitchenarital Status:      Family History: The patient's family history includes Aneurysm (age of onset: 3086in his mother; Cancer in his maternal uncle and paternal  uncle; Diabetes in his sister; Heart attack in his father; Heart disease in his father, sister, and sister; Kidney disease in his brother; Sudden death in his mother; Urolithiasis in his father. There is no history of Hyperlipidemia, Hypertension, or Colon cancer.  ROS:   Please see the history of present illness.    Some parasternal soreness.  No difficulty when he coughs and no drainage.  All other  systems reviewed and are negative.  EKGs/Labs/Other Studies Reviewed:    The following studies were reviewed today: Intraoperative echocardiogram October 2020: POST-OP IMPRESSIONS  - Left Ventricle: The left ventricle is unchanged from pre-bypass.  - Aorta: The aorta appears unchanged from pre-bypass.  - Left Atrial Appendage: The left atrial appendage appears unchanged from  pre-bypass.  - Aortic Valve: The aortic valve appears unchanged from pre-bypass.  - Mitral Valve: The mitral valve appears unchanged from pre-bypass.  - Tricuspid Valve: The tricuspid valve appears unchanged from pre-bypass.  - Pericardium: The pericardium appears unchanged from pre-bypass.   EKG:  EKG performed November 16, 2018 demonstrates sinus bradycardia, poor R wave progression, V2 T wave inversion.  Recent Labs: 10/27/2018: Magnesium 2.2 12/14/2018: ALT 16; Hemoglobin 12.1; Platelets 269 04/03/2019: BUN 29; Creatinine, Ser 1.78; Potassium 5.0; Sodium 140  Recent Lipid Panel    Component Value Date/Time   CHOL 147 12/14/2018 1625   TRIG 84 12/14/2018 1625   HDL 51 12/14/2018 1625   CHOLHDL 2.9 12/14/2018 1625   CHOLHDL 3.4 10/25/2018 0320   VLDL 21 10/25/2018 0320   LDLCALC 80 12/14/2018 1625    Physical Exam:    VS:  BP 108/62   Pulse 70   Ht 5' 11.5" (1.816 m)   Wt 235 lb 6.4 oz (106.8 kg)   SpO2 96%   BMI 32.37 kg/m     Wt Readings from Last 3 Encounters:  06/06/19 235 lb 6.4 oz (106.8 kg)  06/01/19 234 lb 6.4 oz (106.3 kg)  05/24/19 232 lb 6.4 oz (105.4 kg)     GEN: Mild obesity. No  acute distress HEENT: Normal NECK: No JVD. LYMPHATICS: No lymphadenopathy CARDIAC:  RRR without murmur, gallop, or edema. VASCULAR:  Normal Pulses. No bruits. RESPIRATORY:  Clear to auscultation without rales, wheezing or rhonchi  ABDOMEN: Soft, non-tender, non-distended, No pulsatile mass, MUSCULOSKELETAL: No deformity  SKIN: Warm and dry NEUROLOGIC:  Alert and oriented x 3 PSYCHIATRIC:  Normal affect   ASSESSMENT:    1. S/P CABG (coronary artery bypass graft)   2. Pure hypercholesterolemia   3. Essential hypertension   4. Renal insufficiency   5. Type 2 diabetes mellitus with other kidney complication, unspecified whether long term insulin use ([place])   6. Educated about COVID-19 virus infection    PLAN:    In order of problems listed above:  1. Status post coronary bypass surgery for left main disease.  Complete resolution of cardiovascular symptoms.  Secondary prevention discussed in detail. 2. Needs lipid panel prior to the next office visit. 3. Blood pressure is excellent on current medical regimen.  Continue Toprol-XL.  Consider adding ACE/ARB for kidney protection given diabetes. 4. He is on empagliflozin which should help kidney stability.  Consider adding ARB or ACE inhibitor in the future if kidney function remains stable. 5. Continue empagliflozin, liraglutide, exercise, and diet. 6. COVID-19 vaccine has been given.  He did not get sick during the pandemic.  Overall education and awareness concerning primary/secondary risk prevention was discussed in detail: LDL less than 70, hemoglobin A1c less than 7, blood pressure target less than 130/80 mmHg, >150 minutes of moderate aerobic activity per week, avoidance of smoking, weight control (via diet and exercise), and continued surveillance/management of/for obstructive sleep apnea.    Medication Adjustments/Labs and Tests Ordered: Current medicines are reviewed at length with the patient today.  Concerns regarding  medicines are outlined above.  No orders of the defined types were placed in this encounter.  No orders of the defined types were placed in this encounter.   There are no Patient Instructions on file for this visit.   Signed, Sinclair Grooms, MD  06/06/2019 10:39 AM    [place]

## 2019-06-06 NOTE — Patient Instructions (Signed)
Medication Instructions:  Your physician recommends that you continue on your current medications as directed. Please refer to the Current Medication list given to you today.  *If you need a refill on your cardiac medications before your next appointment, please call your pharmacy*   Lab Work: Lipid and Liver prior to seeing Dr. Tamala Julian back in 6 months.  You will need to be fasting for these labs (nothing to eat or drink after midnight except water and black coffee).  If you have labs (blood work) drawn today and your tests are completely normal, you will receive your results only by: Marland Kitchen MyChart Message (if you have MyChart) OR . A paper copy in the mail If you have any lab test that is abnormal or we need to change your treatment, we will call you to review the results.   Testing/Procedures: None   Follow-Up: At [facility], you and your health needs are our priority.  As part of our continuing mission to provide you with exceptional heart care, we have created designated Provider Care Teams.  These Care Teams include your primary Cardiologist (physician) and Advanced Practice Providers (APPs -  Physician Assistants and Nurse Practitioners) who all work together to provide you with the care you need, when you need it.  We recommend signing up for the patient portal called "MyChart".  Sign up information is provided on this After Visit Summary.  MyChart is used to connect with patients for Virtual Visits (Telemedicine).  Patients are able to view lab/test results, encounter notes, upcoming appointments, etc.  Non-urgent messages can be sent to your provider as well.   To learn more about what you can do with MyChart, go to [email or web address].    Your next appointment:   6 month(s)  The format for your next appointment:   In Person  Provider:   You may see Sinclair Grooms, MD or one of the following Advanced Practice Providers on your designated Care Team:    Truitt Merle,  NP  Cecilie Kicks, NP  Kathyrn Drown, NP    Other Instructions

## 2019-06-08 ENCOUNTER — Ambulatory Visit: Payer: PPO | Admitting: Interventional Cardiology

## 2019-06-08 ENCOUNTER — Other Ambulatory Visit: Payer: Self-pay | Admitting: Interventional Cardiology

## 2019-06-12 DIAGNOSIS — G4733 Obstructive sleep apnea (adult) (pediatric): Secondary | ICD-10-CM | POA: Diagnosis not present

## 2019-06-19 ENCOUNTER — Other Ambulatory Visit: Payer: Self-pay | Admitting: Physician Assistant

## 2019-06-20 ENCOUNTER — Other Ambulatory Visit: Payer: Self-pay | Admitting: Physician Assistant

## 2019-06-20 MED ORDER — FLUOXETINE HCL 40 MG PO CAPS: 40.0000 mg | ORAL_CAPSULE | Freq: Every day | ORAL | 0 refills | Status: DC

## 2019-06-29 ENCOUNTER — Other Ambulatory Visit: Payer: Self-pay | Admitting: Physician Assistant

## 2019-06-29 DIAGNOSIS — E1169 Type 2 diabetes mellitus with other specified complication: Secondary | ICD-10-CM

## 2019-06-29 DIAGNOSIS — E1129 Type 2 diabetes mellitus with other diabetic kidney complication: Secondary | ICD-10-CM

## 2019-07-17 ENCOUNTER — Other Ambulatory Visit: Payer: Self-pay | Admitting: Family Medicine

## 2019-07-17 DIAGNOSIS — E1129 Type 2 diabetes mellitus with other diabetic kidney complication: Secondary | ICD-10-CM

## 2019-07-18 ENCOUNTER — Other Ambulatory Visit: Payer: Self-pay

## 2019-07-18 DIAGNOSIS — M25551 Pain in right hip: Secondary | ICD-10-CM | POA: Diagnosis not present

## 2019-07-18 DIAGNOSIS — M7061 Trochanteric bursitis, right hip: Secondary | ICD-10-CM | POA: Diagnosis not present

## 2019-07-18 DIAGNOSIS — M5416 Radiculopathy, lumbar region: Secondary | ICD-10-CM | POA: Diagnosis not present

## 2019-07-18 DIAGNOSIS — M47816 Spondylosis without myelopathy or radiculopathy, lumbar region: Secondary | ICD-10-CM | POA: Diagnosis not present

## 2019-07-18 MED ORDER — ALLOPURINOL 300 MG PO TABS: 150.0000 mg | ORAL_TABLET | Freq: Every day | ORAL | 0 refills | Status: DC

## 2019-07-24 ENCOUNTER — Other Ambulatory Visit: Payer: Self-pay | Admitting: Physician Assistant

## 2019-07-24 ENCOUNTER — Telehealth: Payer: Self-pay | Admitting: Physician Assistant

## 2019-07-24 DIAGNOSIS — E559 Vitamin D deficiency, unspecified: Secondary | ICD-10-CM

## 2019-07-24 DIAGNOSIS — E1129 Type 2 diabetes mellitus with other diabetic kidney complication: Secondary | ICD-10-CM

## 2019-07-24 MED ORDER — VITAMIN D (ERGOCALCIFEROL) 1.25 MG (50000 UNIT) PO CAPS: 50000.0000 [IU] | ORAL_CAPSULE | ORAL | 0 refills | Status: DC

## 2019-07-24 MED ORDER — PEN NEEDLES 31G X 8 MM MISC: 1.0000 | Freq: Every day | 0 refills | Status: DC

## 2019-07-24 NOTE — Telephone Encounter (Signed)
Patient is requesting a refill of his Vit D and insulin needles, if approved please send to [facility]

## 2019-07-24 NOTE — Addendum Note (Signed)
Addended by: Mickel Crow on: 07/24/2019 01:30 PM   Modules accepted: Orders

## 2019-07-24 NOTE — Telephone Encounter (Signed)
Refill sent to requested pharmacy. AS, CMA 

## 2019-08-01 ENCOUNTER — Encounter: Payer: Self-pay | Admitting: Podiatry

## 2019-08-01 ENCOUNTER — Ambulatory Visit: Payer: PPO | Admitting: Podiatry

## 2019-08-01 ENCOUNTER — Other Ambulatory Visit: Payer: Self-pay

## 2019-08-01 DIAGNOSIS — M2042 Other hammer toe(s) (acquired), left foot: Secondary | ICD-10-CM

## 2019-08-01 DIAGNOSIS — L84 Corns and callosities: Secondary | ICD-10-CM | POA: Insufficient documentation

## 2019-08-01 DIAGNOSIS — M2041 Other hammer toe(s) (acquired), right foot: Secondary | ICD-10-CM | POA: Diagnosis not present

## 2019-08-01 DIAGNOSIS — M2010 Hallux valgus (acquired), unspecified foot: Secondary | ICD-10-CM

## 2019-08-01 NOTE — Progress Notes (Signed)
This patient presents the office for continued evaluation of a painful corn on the second toe of the right foot.  He says that the corn has redeveloped from his previous visit and is causing pain and discomfort as he is walking.  Patient states that the corn is starting to worsen and he presents the office today for treatment prior to it worsening.  Patient also has bunions and hammertoes both feet.  Patient also has a history of diabetes mellitus with kidney disease.  He presents the office today for continued evaluation and treatment of his painful corn second toe right foot.  Vascular  Dorsalis pedis and posterior tibial pulses are palpable  B/L.  Capillary return  WNL.  Temperature gradient is  WNL.  Skin turgor  WNL  Sensorium  Senn Weinstein monofilament wire  WNL. Normal tactile sensation.  Nail Exam  Patient has normal nails with no evidence of bacterial or fungal infection.  Orthopedic  Exam  Muscle tone and muscle strength  WNL.  No limitations of motion feet  B/L.  No crepitus or joint effusion noted.  Foot type is unremarkable and digits show no abnormalities.  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  Skin  No open lesions.  Normal skin texture and turgor. Corn noted medial aspect second digit right foot at level of PIPJ.    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  ROV.  Debrided corn second toe right foot.  Dispensed padding between 1/2 right foot.  Told him to try the padding before considering surgical correction.  RTC 3 months.   Gardiner Barefoot DPM

## 2019-08-16 DIAGNOSIS — M47816 Spondylosis without myelopathy or radiculopathy, lumbar region: Secondary | ICD-10-CM | POA: Diagnosis not present

## 2019-08-30 ENCOUNTER — Ambulatory Visit
Admission: EM | Admit: 2019-08-30 | Discharge: 2019-08-30 | Disposition: A | Discharge status: Home / Self Care | Payer: PPO

## 2019-08-30 ENCOUNTER — Other Ambulatory Visit: Payer: Self-pay

## 2019-08-30 DIAGNOSIS — L2489 Irritant contact dermatitis due to other agents: Secondary | ICD-10-CM | POA: Diagnosis not present

## 2019-08-30 MED ORDER — TRIAMCINOLONE ACETONIDE 0.025 % EX OINT: 1.0000 "application " | TOPICAL_OINTMENT | Freq: Every evening | CUTANEOUS | 0 refills | Status: DC | PRN

## 2019-08-30 MED ORDER — DESONIDE 0.05 % EX CREA: TOPICAL_CREAM | Freq: Two times a day (BID) | CUTANEOUS | 0 refills | Status: DC

## 2019-08-30 NOTE — Discharge Instructions (Signed)
Desonide cream during the day as directed. Triamcinolone ointment at night as needed. As discussed, please try to avoid sunlight as using steroid cream could change your skin color with sun exposure. Monitor for signs of infection including spreading redness, warmth, fever.

## 2019-08-30 NOTE — ED Provider Notes (Signed)
[facility]    CSN: [id number] Arrival date & time: 08/30/19  0815      History   Chief Complaint Chief Complaint  Patient presents with  . Rash    HPI [name] is a 64 y.o. male.   64 year old male comes in for 1 week history of rash to the left neck/head. Rash was itching at first, now burning and hurting. States this started after working in the yard. Was first behind the ear, but after shaving, now to the neck and head. Denies spreading erythema, warmth. Denies fever.      Past Medical History:  Diagnosis Date  . Carpal tunnel syndrome, bilateral   . Carpal tunnel syndrome, right    nerve impingement right arm/wears brace  . Coronary artery disease   . Diabetes mellitus    no meds  . Dyspnea   . GERD (gastroesophageal reflux disease)   . Gout   . Hepatic steatosis   . Hyperlipidemia   . Hypertension   . Obstructive sleep apnea     Patient Active Problem List   Diagnosis Date Noted  . Hammer toe of second toe of left foot 08/01/2019  . Hammer toe of second toe of right foot 08/01/2019  . Hav (hallux abducto valgus), unspecified laterality 08/01/2019  . Corns and callosities 08/01/2019  . Corn or callus 04/03/2019  . Morbid obesity (HCC) 04/03/2019  . Diabetes mellitus with proteinuria (HCC) 04/03/2019  . Gastroesophageal reflux disease 04/03/2019  . Insomnia 04/03/2019  . Sleep disorder 12/05/2018  . CAD (coronary artery disease) 10/26/2018  . 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 Coronary artery disease involving native coronary artery of native heart with unstable angina pectoris (HCC)   . Chest pain 10/24/2018  . Back pain 09/26/2018  . Type 2 diabetes mellitus with renal complication (HCC) 08/09/2018  . Diabetes mellitus (HCC) 02/08/2018  . Chronic kidney disease (CKD), stage III (moderate) 02/08/2018  . Tinea corporis 11/08/2017  . Chronic left hip pain 07/26/2017  . Facet arthropathy, lumbosacral 07/26/2017  . DDD (degenerative disc  disease), lumbosacral 07/26/2017  . Attention deficit hyperactivity disorder (ADHD), predominantly inattentive type 07/26/2017  . 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) 07/26/2017  . Overweight (BMI 25.0-29.9) 04/25/2017  . Noncompliance 03/24/2017  . Abdominal bloating 05/15/2015  . UTI (urinary tract infection) 04/20/2014  . Prostatitis 04/20/2014  . Hyperglycemia 04/20/2014  . Fatigue 03/07/2014  . Family history of early CAD 03/07/2014  . Maxillary sinusitis, acute 12/06/2013  . Tinea versicolor 12/06/2013  . Physical exam 11/06/2013  . 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 Mood disorder (HCC) 03/29/2013  . Other malaise and fatigue 03/29/2013  . Decreased libido 03/29/2013  . Hematuria 02/17/2013  . Right elbow pain 12/15/2012  . Pustular folliculitis 09/28/2012  . Left ankle pain 03/15/2012  . Hypertension associated with diabetes (HCC) 12/01/2011  . Renal insufficiency 12/01/2011  . SPINAL STENOSIS, LUMBAR 08/02/2008  . Vitamin D deficiency 11/30/2007  . GOUT 11/30/2007  . BPH (benign prostatic hyperplasia) 11/30/2007  . Diabetes mellitus with renal manifestation (HCC) 06/11/2006  . ANEMIA NEC 06/11/2006  . Chronic back pain greater than 3 months duration 06/11/2006  . Hyperlipidemia associated with type 2 diabetes mellitus (HCC) 05/26/2006  . OBSTRUCTIVE SLEEP APNEA 05/26/2006    Past Surgical History:  Procedure Laterality Date  . CARDIAC CATHETERIZATION    . CARPAL TUNNEL RELEASE  09/10/2015   RIGHT WRIST /   WITH NERVE IMPINGEMENT SURGERY  . CERVICAL FUSION     Dr Nudelman  . COLONOSCOPY  2007   negative; [facility]  . CORONARY ANGIOGRAPHY N/A 10/25/2018   Procedure: CORONARY ANGIOGRAPHY (CATH LAB);  Surgeon: Cooper, Michael, MD;  Location: [facility] INVASIVE CV LAB;  Service: Cardiovascular;  Laterality: N/A;  . CORONARY ARTERY BYPASS GRAFT N/A 10/26/2018   Procedure: CORONARY ARTERY  BYPASS GRAFTING (CABG) x3 using the LIMA to LAD and right greater saphenous vein via endoscopic harvest to the Diag 1 and OM 2.;  Surgeon: Lightfoot, Harrell O, MD;  Location: [facility] OR;  Service: Open Heart Surgery;  Laterality: N/A;  . ESOPHAGEAL DILATION  2007  . 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 LUMBAR FUSION      Dr Nudelman       Home Medications    Prior to Admission medications   Medication Sig Start Date End Date Taking? Authorizing Provider  allopurinol (ZYLOPRIM) 300 MG tablet Take 0.5 tablets (150 mg total) by mouth daily. 07/18/19  Yes Abonza, Maritza, PA-C  aspirin 81 MG tablet Take 81 mg by mouth daily.     Yes [provider]  atorvastatin (LIPITOR) 80 MG tablet Take 1 tablet (80 mg total) by mouth at bedtime. 04/17/19  Yes Smith, Henry W, MD  empagliflozin (JARDIANCE) 10 MG TABS tablet Take 10 mg by mouth daily before breakfast. 03/13/19  Yes Smith, Henry W, MD  FLUoxetine (PROZAC) 40 MG capsule Take 1 capsule (40 mg total) by mouth daily. 06/20/19  Yes Abonza, Maritza, PA-C  gabapentin (NEURONTIN) 800 MG tablet TAKE 1 TABLET BY MOUTH 3 TIMES DAILY 01/02/19  Yes Opalski, Deborah, DO  Insulin Pen Needle (PEN NEEDLES) 31G X 8 MM MISC 1 each by Does not apply route daily. 07/24/19  Yes Abonza, Maritza, PA-C  liraglutide (VICTOZA) 18 MG/3ML SOPN INJECT 0.6MG SUBCUTANEOUSLY ONCE DAILY FOR 7 DAYS, THEN INCREASE TO 1.2MG ONCE DAILY IF BLOOD SUGAR HAS NOT MET GOAL 06/29/19  Yes Abonza, Maritza, PA-C  metoprolol succinate (TOPROL-XL) 25 MG 24 hr tablet Take 1 tablet (25 mg total) by mouth daily. 03/13/19  Yes Smith, Henry W, MD  pantoprazole (PROTONIX) 40 MG tablet TAKE 1 TABLET BY MOUTH DAILY 06/19/19  Yes Abonza, Maritza, PA-C  acetaminophen (TYLENOL) 500 MG tablet Take 2 tablets (1,000 mg total) by mouth every 6 (six) hours as needed. 10/31/18   Barrett, Erin R, PA-C  Ascorbic Acid (VITAMIN C) 1000 MG tablet Take 1,000 mg by mouth daily.     [provider]  benazepril (LOTENSIN) 10 MG tablet Take 10 mg by mouth daily. 06/08/19   [provider]  blood glucose meter kit and supplies Dispense based on patient and insurance preference. Use to check fasting blood glucose in the morning and to check glucose 2 hours after largest meal of the day. (FOR ICD-10 E10.9, E11.9). 01/13/17   Opalski, Deborah, DO  desonide (DESOWEN) 0.05 % cream Apply topically 2 (two) times daily. 08/30/19   Yu, Amy V, PA-C  glucose blood test strip Use as instructed 01/13/17   Opalski, Deborah, DO  LANCETS ULTRA FINE MISC Use to check fasting blood glucose in the morning and to check glucose 2 hours after largest meal of the day 01/13/17   Opalski, Deborah, DO  Multiple Vitamins-Iron (MULTIVITAMINS WITH IRON) TABS Take 1 tablet by mouth daily. 11/27/11   Chatten, Carmen L, NP  polyethylene glycol powder (GLYCOLAX/MIRALAX) powder USE AS DIRECTED 07/17/14     Tabori, Katherine E, MD  pyridoxine (B-6) 100 MG tablet Take 100 mg by mouth daily.    [provider]  traMADol (ULTRAM) 50 MG tablet Take 1-2 tablets (50-100 mg total) by mouth every 4 (four) hours as needed for moderate pain. 10/31/18   Barrett, Erin R, PA-C  triamcinolone (KENALOG) 0.025 % ointment Apply 1 application topically at bedtime as needed. Do not use more than 5 days 08/30/19   Yu, Amy V, PA-C  Vitamin D, Ergocalciferol, (DRISDOL) 1.25 MG (50000 UNIT) CAPS capsule Take 1 capsule (50,000 Units total) by mouth every 7 (seven) days. TAKE 1 CAPSULE BY MOUTH EVERY 7 DAYS 07/24/19   Abonza, Maritza, PA-C    Family History Family History  Problem Relation Age of Onset  . Heart disease Father        pacer  . Urolithiasis Father   . Heart attack Father   . Aneurysm Mother 30       CNS aneurysm  . Sudden death Mother   . Diabetes Sister   . Cancer Maternal Uncle        ? primary  . Cancer Paternal Uncle        ? primary  . Kidney disease Brother   . Heart disease Sister   . Heart  disease Sister   . Hyperlipidemia Neg Hx   . Hypertension Neg Hx   . Colon cancer Neg Hx     Social History Social History   Tobacco Use  . Smoking status: Former Smoker    Packs/day: 1.50    Years: 4.00    Pack years: 6.00    Types: Cigarettes    Quit date: 01/06/1976    Years since quitting: 43.6  . Smokeless tobacco: Former User  . Tobacco comment: smoked 35 years ago as of 2013   Vaping Use  . Vaping Use: Never used  Substance Use Topics  . Alcohol use: No  . Drug use: No     Allergies   Prednisone   Review of Systems Review of Systems  Reason unable to perform ROS: See HPI as above.     Physical Exam Triage Vital Signs ED Triage Vitals  Enc Vitals Group     BP 08/30/19 0822 124/80     Pulse Rate 08/30/19 0822 63     Resp 08/30/19 0822 18     Temp 08/30/19 0822 97.9 F (36.6 C)     Temp Source 08/30/19 0822 Oral     SpO2 08/30/19 0822 98 %     Weight --      Height --      Head Circumference --      Peak Flow --      Pain Score 08/30/19 0832 6     Pain Loc --      Pain Edu? --      Excl. in GC? --    No data found.  Updated Vital Signs BP 124/80 (BP Location: Left Arm)   Pulse 63   Temp 97.9 F (36.6 C) (Oral)   Resp 18   SpO2 98%   Physical Exam Constitutional:      General: He is not in acute distress.    Appearance: Normal appearance. He is well-developed. He is not toxic-appearing or diaphoretic.  HENT:     Head: Normocephalic and atraumatic.  Eyes:     Conjunctiva/sclera: Conjunctivae normal.     Pupils: Pupils are equal, round, and reactive to light.  Pulmonary:       Effort: Pulmonary effort is normal. No respiratory distress.  Musculoskeletal:     Cervical back: Normal range of motion and neck supple.  Skin:    General: Skin is warm and dry.     Comments: Vesicular rash to the left neck, posterior scalp crossing midline. No erythema, warmth.   Neurological:     Mental Status: He is alert and oriented to person, place, and  time.      UC Treatments / Results  Labs (all labs ordered are listed, but only abnormal results are displayed) Labs Reviewed - No data to display  EKG   Radiology No results found.  Procedures Procedures (including critical care time)  Medications Ordered in UC Medications - No data to display  Initial Impression / Assessment and Plan / UC Course  I have reviewed the triage vital signs and the nursing notes.  Pertinent labs & imaging results that were available during my care of the patient were reviewed by me and considered in my medical decision making (see chart for details).    Low suspicion for shingles as rash crosses midline. Patient with history of DM, will defer oral prednisone for now. Will use triamcinolone ointment, desonide as directed. Return precautions given.  Final Clinical Impressions(s) / UC Diagnoses   Final diagnoses:  Irritant contact dermatitis due to other agents   ED Prescriptions    Medication Sig Dispense Auth. Provider   triamcinolone (KENALOG) 0.025 % ointment Apply 1 application topically at bedtime as needed. Do not use more than 5 days 30 g Yu, Amy V, PA-C   desonide (DESOWEN) 0.05 % cream Apply topically 2 (two) times daily. 30 g Yu, Amy V, PA-C     PDMP not reviewed this encounter.   Yu, Amy V, PA-C 08/30/19 0924  

## 2019-08-30 NOTE — ED Triage Notes (Signed)
Pt c/o itching rash to left neck and head for approx 1 week after completing yard work and trimming vines/brush. Has been applying triple abx ointment. Hasn't taken insulin "shot" for a week until this morning 2/2 insurance not covering financially. CBG 140 this morning.

## 2019-09-05 ENCOUNTER — Other Ambulatory Visit: Payer: Self-pay | Admitting: Interventional Cardiology

## 2019-09-12 ENCOUNTER — Other Ambulatory Visit: Payer: Self-pay | Admitting: Physician Assistant

## 2019-09-12 DIAGNOSIS — G4733 Obstructive sleep apnea (adult) (pediatric): Secondary | ICD-10-CM | POA: Diagnosis not present

## 2019-09-27 DIAGNOSIS — M47816 Spondylosis without myelopathy or radiculopathy, lumbar region: Secondary | ICD-10-CM | POA: Diagnosis not present

## 2019-10-09 ENCOUNTER — Other Ambulatory Visit: Payer: Self-pay | Admitting: Physician Assistant

## 2019-10-16 ENCOUNTER — Other Ambulatory Visit: Payer: Self-pay | Admitting: Physician Assistant

## 2019-10-16 DIAGNOSIS — E1129 Type 2 diabetes mellitus with other diabetic kidney complication: Secondary | ICD-10-CM

## 2019-10-16 DIAGNOSIS — R809 Proteinuria, unspecified: Secondary | ICD-10-CM

## 2019-10-16 DIAGNOSIS — E1169 Type 2 diabetes mellitus with other specified complication: Secondary | ICD-10-CM

## 2019-10-16 DIAGNOSIS — E559 Vitamin D deficiency, unspecified: Secondary | ICD-10-CM

## 2019-10-25 ENCOUNTER — Other Ambulatory Visit: Payer: Self-pay

## 2019-10-25 ENCOUNTER — Encounter: Payer: Self-pay | Admitting: Podiatry

## 2019-10-25 ENCOUNTER — Ambulatory Visit: Payer: PPO | Admitting: Podiatry

## 2019-10-25 DIAGNOSIS — B351 Tinea unguium: Secondary | ICD-10-CM | POA: Insufficient documentation

## 2019-10-25 DIAGNOSIS — M79675 Pain in left toe(s): Secondary | ICD-10-CM

## 2019-10-25 DIAGNOSIS — M79674 Pain in right toe(s): Secondary | ICD-10-CM

## 2019-10-25 DIAGNOSIS — N183 Chronic kidney disease, stage 3 unspecified: Secondary | ICD-10-CM | POA: Diagnosis not present

## 2019-10-25 DIAGNOSIS — E1129 Type 2 diabetes mellitus with other diabetic kidney complication: Secondary | ICD-10-CM | POA: Diagnosis not present

## 2019-10-25 DIAGNOSIS — E119 Type 2 diabetes mellitus without complications: Secondary | ICD-10-CM

## 2019-10-25 DIAGNOSIS — L84 Corns and callosities: Secondary | ICD-10-CM

## 2019-10-25 DIAGNOSIS — M2041 Other hammer toe(s) (acquired), right foot: Secondary | ICD-10-CM

## 2019-10-25 NOTE — Progress Notes (Signed)
This patient presents the office for continued evaluation of a painful corn on the second toe of the right foot.  He says that the corn  is causing pain and discomfort as he is walking.  Patient says the padding has helped and he always wears the toe cap.  Patient also has bunions and hammertoes both feet.  Patient also has a history of diabetes mellitus with kidney disease.  He presents the office today for continued evaluation and treatment of his painful corn second toe right foot.  Vascular  Dorsalis pedis and posterior tibial pulses are palpable  B/L.  Capillary return  WNL.  Temperature gradient is  WNL.  Skin turgor  WNL  Sensorium  Senn Weinstein monofilament wire  WNL. Normal tactile sensation.  Nail Exam  Patient has normal nails with no evidence of bacterial or fungal infection.  Orthopedic  Exam  Muscle tone and muscle strength  WNL.  No limitations of motion feet  B/L.  No crepitus or joint effusion noted.  Foot type is unremarkable and digits show no abnormalities.  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  Skin  No open lesions.  Normal skin texture and turgor. Corn noted medial aspect second digit right foot at level of PIPJ.    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  ROV.  Debrided corn second toe right foot.  Dispensed padding between 1/2 right foot.  Marland Kitchen  RTC 3 months.   Gardiner Barefoot DPM

## 2019-11-14 ENCOUNTER — Telehealth: Payer: Self-pay | Admitting: Interventional Cardiology

## 2019-11-14 NOTE — Telephone Encounter (Signed)
Spoke with [facility] and made her aware that medication is still on pt's med list and he should be taking it.  Advised he has several refills at the pharmacy.  She will reach out to pt and let him know that he is suppose to be taking that medication and have pharmacy fill it.  [facility] appreciative for call.

## 2019-11-14 NOTE — Telephone Encounter (Signed)
Monica with [facility] is calling to inquire about whether or not patient is to continue taking JARDIANCE 10 MG TABS tablet. Per [place], patient stated he has not been taking the medication. Brayton Layman is inquiring about whether or not patient needs to discontinue. If patient should still be taking, she states he may need refills. Please return call to advise.  Phone Number: [phone]

## 2019-11-16 DIAGNOSIS — E1122 Type 2 diabetes mellitus with diabetic chronic kidney disease: Secondary | ICD-10-CM | POA: Diagnosis not present

## 2019-11-16 DIAGNOSIS — E875 Hyperkalemia: Secondary | ICD-10-CM | POA: Diagnosis not present

## 2019-11-16 DIAGNOSIS — I129 Hypertensive chronic kidney disease with stage 1 through stage 4 chronic kidney disease, or unspecified chronic kidney disease: Secondary | ICD-10-CM | POA: Diagnosis not present

## 2019-11-16 DIAGNOSIS — Z23 Encounter for immunization: Secondary | ICD-10-CM | POA: Diagnosis not present

## 2019-11-16 DIAGNOSIS — N183 Chronic kidney disease, stage 3 unspecified: Secondary | ICD-10-CM | POA: Diagnosis not present

## 2019-11-16 DIAGNOSIS — M109 Gout, unspecified: Secondary | ICD-10-CM | POA: Diagnosis not present

## 2019-11-21 ENCOUNTER — Telehealth: Payer: Self-pay

## 2019-11-21 NOTE — Telephone Encounter (Signed)
**Note De-Identified Jamani Eley Obfuscation** We received an urgent request Texanna Hilburn fax from Thressa Sheller with [facility] asking [place] to compete the MD page of a BI care Pt asst application for Jardiance for this pt that she included. I competed the page and emailed it to Dr Darliss Ridgel nurse so she can obtain his signature , date it and to fax back to [facility] at fax number written on cover sheet included.

## 2019-11-23 NOTE — Telephone Encounter (Signed)
**Note De-Identified Brentt Fread Obfuscation** Dr Tamala Julian is out of the office until 11/22. Since this is an urgent request I completed the MD page with our DOD Dr Marlou Porch information written in and have emailed to Dr Thompson Caul nurse so she can obtain Dr Kingsley Plan signature , date it, and to fax to [facility] at number written on cover letter.  I did add a note on the cover letter advising Susy Frizzle, CPhT of reason that Dr Tamala Julian did ot sign.

## 2019-11-23 NOTE — Telephone Encounter (Signed)
Information faxed to [facility]

## 2019-12-04 ENCOUNTER — Other Ambulatory Visit: Payer: Self-pay | Admitting: Physician Assistant

## 2019-12-04 ENCOUNTER — Telehealth: Payer: Self-pay | Admitting: Physician Assistant

## 2019-12-04 NOTE — Progress Notes (Signed)
Cardiology Office Note:    Date:  12/07/2019   ID:  [name], DOB [birth year], MRN [id number]  PCP:  Lorrene Reid, PA-C  Cardiologist:  Sinclair Grooms, MD   Referring MD: Lorrene Reid, PA-C   Chief Complaint  Patient presents with  . Coronary Artery Disease  . Hypertension  . Hyperlipidemia    History of Present Illness:    [name] is a 64 y.o. male with a hx of CABG forsevere LM disease (LIMA to LAD, SVG to DX1 and OM 10/26/18),DM, HTN, OSA, GERD and HLD.  He does not sleep well.  He denies chest pain, dyspnea, and claudication.  Has been lax on his exercise requirement.  Elevated triglyceride, 172 on most recent statin panel L DL is 80.  Past Medical History:  Diagnosis Date  . Carpal tunnel syndrome, bilateral   . Carpal tunnel syndrome, right    nerve impingement right arm/wears brace  . Coronary artery disease   . Diabetes mellitus    no meds  . Dyspnea   . GERD (gastroesophageal reflux disease)   . Gout   . Hepatic steatosis   . Hyperlipidemia   . Hypertension   . Obstructive sleep apnea     Past Surgical History:  Procedure Laterality Date  . CARDIAC CATHETERIZATION    . CARPAL TUNNEL RELEASE  09/10/2015   RIGHT WRIST / WITH NERVE IMPINGEMENT SURGERY  . CERVICAL FUSION     Dr Sherwood Gambler  . COLONOSCOPY  2007   negative; [facility]  . CORONARY ANGIOGRAPHY N/A 10/25/2018   Procedure: CORONARY ANGIOGRAPHY (CATH LAB);  Surgeon: Sherren Mocha, MD;  Location: [facility];  Service: Cardiovascular;  Laterality: N/A;  . CORONARY ARTERY BYPASS GRAFT N/A 10/26/2018   Procedure: CORONARY ARTERY BYPASS GRAFTING (CABG) x3 using the LIMA to LAD and right greater saphenous vein via endoscopic harvest to the Diag 1 and OM 2.;  Surgeon: Lajuana Matte, MD;  Location: [facility];  Service: Open Heart Surgery;  Laterality: N/A;  . ESOPHAGEAL DILATION  2007  . 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 LUMBAR  FUSION      Dr Sherwood Gambler    Current Medications: Current Meds  Medication Sig  . acetaminophen (TYLENOL) 500 MG tablet Take 2 tablets (1,000 mg total) by mouth every 6 (six) hours as needed.  Marland Kitchen allopurinol (ZYLOPRIM) 300 MG tablet TAKE 1/2 TABLET BY MOUTH DAILY  . Ascorbic Acid (VITAMIN C) 1000 MG tablet Take 1,000 mg by mouth daily.  Marland Kitchen aspirin 81 MG tablet Take 81 mg by mouth daily.    Marland Kitchen atorvastatin (LIPITOR) 80 MG tablet Take 1 tablet (80 mg total) by mouth at bedtime.  . benazepril (LOTENSIN) 10 MG tablet Take 10 mg by mouth daily.  . blood glucose meter kit and supplies Dispense based on patient and insurance preference. Use to check fasting blood glucose in the morning and to check glucose 2 hours after largest meal of the day. (FOR ICD-10 E10.9, E11.9).  Marland Kitchen FLUoxetine (PROZAC) 40 MG capsule Take 1 capsule (40 mg total) by mouth daily. **NEEDS APT FOR REFILLS**  . gabapentin (NEURONTIN) 800 MG tablet TAKE 1 TABLET BY MOUTH 3 TIMES DAILY  . glucose blood test strip Use as instructed  . Insulin Pen Needle (PEN NEEDLES) 31G X 8 MM MISC 1 each by Does not apply route daily.  Marland Kitchen JARDIANCE 10 MG TABS tablet TAKE 1 TABLET BY MOUTH DAILY BEFORE  BREAKFAST  . LANCETS ULTRA FINE MISC Use to check fasting blood glucose in the morning and to check glucose 2 hours after largest meal of the day  . metoprolol succinate (TOPROL-XL) 25 MG 24 hr tablet Take 1 tablet (25 mg total) by mouth daily.  . Multiple Vitamins-Iron (MULTIVITAMINS WITH IRON) TABS Take 1 tablet by mouth daily.  . pantoprazole (PROTONIX) 40 MG tablet Take 1 tablet (40 mg total) by mouth daily. **NEEDS APT FOR REFILLS**  . polyethylene glycol powder (GLYCOLAX/MIRALAX) powder USE AS DIRECTED  . pyridoxine (B-6) 100 MG tablet Take 100 mg by mouth daily.  . traMADol (ULTRAM) 50 MG tablet Take 1-2 tablets (50-100 mg total) by mouth every 4 (four) hours as needed for moderate pain.  Marland Kitchen VICTOZA 18 MG/3ML SOPN INJECT 0.6MG SUBCUTANEOUSLY ONCE  DAILY FOR 7 DAYS, THEN INCREASE TO 1.2MG ONCE DAILY IF BLOOD SUGAR HAS NOT MET GOAL  . Vitamin D, Ergocalciferol, (DRISDOL) 1.25 MG (50000 UNIT) CAPS capsule TAKE 1 CAPSULE BY MOUTH EVERY 7 DAYS     Allergies:   Prednisone   Social History   Socioeconomic History  . Marital status: Married    Spouse name: Not on file  . Number of children: Not on file  . Years of education: Not on file  . Highest education level: Not on file  Occupational History  . Occupation: Disabled  Tobacco Use  . Smoking status: Former Smoker    Packs/day: 1.50    Years: 4.00    Pack years: 6.00    Types: Cigarettes    Quit date: 01/06/1976    Years since quitting: 43.9  . Smokeless tobacco: Former Systems developer  . Tobacco comment: smoked 35 years ago as of 2013   Vaping Use  . Vaping Use: Never used  Substance and Sexual Activity  . Alcohol use: No  . Drug use: No  . Sexual activity: Yes    Birth control/protection: None  Other Topics Concern  . Not on file  Social History Narrative  . Not on file   Social Determinants of Health   Financial Resource Strain:   . Difficulty of Paying Living Expenses: Not on file  Food Insecurity:   . Worried About Charity fundraiser in the Last Year: Not on file  . Ran Out of Food in the Last Year: Not on file  Transportation Needs: No Transportation Needs  . Lack of Transportation (Medical): No  . Lack of Transportation (Non-Medical): No  Physical Activity: Inactive  . Days of Exercise per Week: 0 days  . Minutes of Exercise per Session: 0 min  Stress: No Stress Concern Present  . Feeling of Stress : Only a little  Social Connections:   . Frequency of Communication with Friends and Family: Not on file  . Frequency of Social Gatherings with Friends and Family: Not on file  . Attends Religious Services: Not on file  . Active Member of Clubs or Organizations: Not on file  . Attends Archivist Meetings: Not on file  . Marital Status: Not on file      Family History: The patient's family history includes Aneurysm (age of onset: 47) in his mother; Cancer in his maternal uncle and paternal uncle; Diabetes in his sister; Heart attack in his father; Heart disease in his father, sister, and sister; Kidney disease in his brother; Sudden death in his mother; Urolithiasis in his father. There is no history of Hyperlipidemia, Hypertension, or Colon cancer.  ROS:   Please  see the history of present illness.    Difficulty sleeping.  Goes to [facility] care.  All other systems reviewed and are negative.  EKGs/Labs/Other Studies Reviewed:    The following studies were reviewed today:  Creatinine 1.78 mg/dL in March.  2D Doppler echocardiogram 10/25/2018: IMPRESSIONS    1. Left ventricular ejection fraction, by visual estimation, is 55 to  60%. The left ventricle has normal function. Normal left ventricular size.  There is no left ventricular hypertrophy.  2. Global right ventricle has normal systolic function.The right  ventricular size is mildly enlarged. No increase in right ventricular wall  thickness.  3. Left atrial size was mildly dilated.  4. Right atrial size was normal.  5. Mild to moderate mitral annular calcification.  6. The mitral valve is normal in structure. Trace mitral valve  regurgitation. No evidence of mitral stenosis.  7. The tricuspid valve is normal in structure. Tricuspid valve  regurgitation is trivial.  8. The aortic valve is tricuspid Aortic valve regurgitation was not  visualized by color flow Doppler. Mild aortic valve sclerosis without  stenosis.  9. The pulmonic valve was normal in structure. Pulmonic valve  regurgitation is not visualized by color flow Doppler.  10. TR signal is inadequate for assessing pulmonary artery systolic  pressure.   EKG:  EKG nonspecific ST-T wave changes.  Sinus rhythm.  Short PR interval.  Recent Labs: 12/14/2018: Hemoglobin 12.1; Platelets 269 04/03/2019:  BUN 29; Creatinine, Ser 1.78; Potassium 5.0; Sodium 140 12/05/2019: ALT 34  Recent Lipid Panel    Component Value Date/Time   CHOL 149 12/05/2019 0824   TRIG 174 (H) 12/05/2019 0824   HDL 39 (L) 12/05/2019 0824   CHOLHDL 3.8 12/05/2019 0824   CHOLHDL 3.4 10/25/2018 0320   VLDL 21 10/25/2018 0320   LDLCALC 80 12/05/2019 0824    Physical Exam:    VS:  BP 120/80   Pulse 61   Ht '5\' 11"'  (1.803 m)   Wt 238 lb 12.8 oz (108.3 kg)   SpO2 98%   BMI 33.31 kg/m     Wt Readings from Last 3 Encounters:  12/07/19 238 lb 12.8 oz (108.3 kg)  12/07/19 237 lb 12.8 oz (107.9 kg)  06/06/19 235 lb 6.4 oz (106.8 kg)     GEN: Obese. No acute distress HEENT: Normal NECK: No JVD. LYMPHATICS: No lymphadenopathy CARDIAC: S4 gallop RRR without murmur, S3 gallop, or edema. VASCULAR:  Normal Pulses. No bruits. RESPIRATORY:  Clear to auscultation without rales, wheezing or rhonchi  ABDOMEN: Soft, non-tender, non-distended, No pulsatile mass, MUSCULOSKELETAL: No deformity  SKIN: Warm and dry NEUROLOGIC:  Alert and oriented x 3 PSYCHIATRIC:  Normal affect   ASSESSMENT:    1. S/P CABG (coronary artery bypass graft)   2. Essential hypertension   3. Pure hypercholesterolemia   4. 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 Educated about COVID-19 virus infection    PLAN:    In order of problems listed above:  1. Secondary prevention discussed.  We had an extended conversation concerning the importance of aerobic activity and at a minimum achieving 150 minutes/week. 2. Target blood pressure is 120/75 mmHg. 3. LDL cholesterol target is 70.  Triglyceride target should be less than 135.  Continue high intensity statin therapy.  Decrease saturated fats in diet.  If triglycerides remain elevated, consider Vascepa therapy for additional risk reduction. 4. Hemoglobin A1c needs to be less than 7.  He is on empagliflozin. 5. Vaccinated and  practicing social medication.  Overall education and awareness concerning primary/secondary risk prevention was discussed in detail: LDL less than 70, hemoglobin A1c less than 7, blood pressure target less than 130/80 mmHg, >150 minutes of moderate aerobic activity per week, avoidance of smoking, weight control (via diet and exercise), and continued surveillance/management of/for obstructive sleep apnea.  CONSIDER VASCEPA!   Medication Adjustments/Labs and Tests Ordered: Current medicines are reviewed at length with the patient today.  Concerns regarding medicines are outlined above.  Orders Placed This Encounter  Procedures  . EKG 12-Lead   No orders of the defined types were placed in this encounter.   Patient Instructions  Medication Instructions:  Your physician recommends that you continue on your current medications as directed. Please refer to the Current Medication list given to you today.  *If you need a refill on your cardiac medications before your next appointment, please call your pharmacy*   Lab Work: None If you have labs (blood work) drawn today and your tests are completely normal, you will receive your results only by: Marland Kitchen MyChart Message (if you have MyChart) OR . A paper copy in the mail If you have any lab test that is abnormal or we need to change your treatment, we will call you to review the results.   Testing/Procedures: None   Follow-Up: At [facility], you and your health needs are our priority.  As part of our continuing mission to provide you with exceptional heart care, we have created designated Provider Care Teams.  These Care Teams include your primary Cardiologist (physician) and Advanced Practice Providers (APPs -  Physician Assistants and Nurse Practitioners) who all work together to provide you with the care you need, when you need it.  We recommend signing up for the patient portal called "MyChart".  Sign up information is provided on this  After Visit Summary.  MyChart is used to connect with patients for Virtual Visits (Telemedicine).  Patients are able to view lab/test results, encounter notes, upcoming appointments, etc.  Non-urgent messages can be sent to your provider as well.   To learn more about what you can do with MyChart, go to [email or web address].    Your next appointment:   12 month(s)  The format for your next appointment:   In Person  Provider:   You may see Sinclair Grooms, MD or one of the following Advanced Practice Providers on your designated Care Team:    Truitt Merle, NP  Cecilie Kicks, NP  Kathyrn Drown, NP    Other Instructions  Your provider recommends that you maintain 150 minutes per week of moderate aerobic activity.     Signed, Sinclair Grooms, MD  12/07/2019 10:42 AM    [place]

## 2019-12-04 NOTE — Telephone Encounter (Signed)
Please call patient to schedule an apt for med refills. AS, CMA 

## 2019-12-05 ENCOUNTER — Other Ambulatory Visit: Payer: PPO | Admitting: *Deleted

## 2019-12-05 ENCOUNTER — Other Ambulatory Visit: Payer: Self-pay

## 2019-12-05 DIAGNOSIS — E7800 Pure hypercholesterolemia, unspecified: Secondary | ICD-10-CM | POA: Diagnosis not present

## 2019-12-05 LAB — LIPID PANEL
Chol/HDL Ratio: 3.8 ratio (ref 0.0–5.0)
Cholesterol, Total: 149 mg/dL (ref 100–199)
HDL: 39 mg/dL — ABNORMAL LOW (ref >39)
LDL Chol Calc (NIH): 80 mg/dL (ref 0–99)
Triglycerides: 174 mg/dL — ABNORMAL HIGH (ref 0–149)
VLDL Cholesterol Cal: 30 mg/dL (ref 5–40)

## 2019-12-05 LAB — HEPATIC FUNCTION PANEL
ALT: 34 IU/L (ref 0–44)
AST: 32 IU/L (ref 0–40)
Albumin: 4.3 g/dL (ref 3.8–4.8)
Alkaline Phosphatase: 125 IU/L — ABNORMAL HIGH (ref 44–121)
Bilirubin Total: 0.4 mg/dL (ref 0.0–1.2)
Bilirubin, Direct: 0.1 mg/dL (ref 0.00–0.40)
Total Protein: 6.9 g/dL (ref 6.0–8.5)

## 2019-12-07 ENCOUNTER — Ambulatory Visit: Payer: PPO | Admitting: Interventional Cardiology

## 2019-12-07 ENCOUNTER — Ambulatory Visit (INDEPENDENT_AMBULATORY_CARE_PROVIDER_SITE_OTHER): Payer: PPO | Admitting: Physician Assistant

## 2019-12-07 ENCOUNTER — Other Ambulatory Visit: Payer: Self-pay

## 2019-12-07 ENCOUNTER — Encounter: Payer: Self-pay | Admitting: Interventional Cardiology

## 2019-12-07 ENCOUNTER — Encounter: Payer: Self-pay | Admitting: Physician Assistant

## 2019-12-07 VITALS — BP 116/63 | HR 64 | Ht 71.5 in | Wt 237.8 lb

## 2019-12-07 VITALS — BP 120/80 | HR 61 | Ht 71.0 in | Wt 238.8 lb

## 2019-12-07 DIAGNOSIS — I152 Hypertension secondary to endocrine disorders: Secondary | ICD-10-CM | POA: Diagnosis not present

## 2019-12-07 DIAGNOSIS — E785 Hyperlipidemia, unspecified: Secondary | ICD-10-CM

## 2019-12-07 DIAGNOSIS — E1169 Type 2 diabetes mellitus with other specified complication: Secondary | ICD-10-CM

## 2019-12-07 DIAGNOSIS — N184 Chronic kidney disease, stage 4 (severe): Secondary | ICD-10-CM | POA: Diagnosis not present

## 2019-12-07 DIAGNOSIS — Z951 Presence of aortocoronary bypass graft: Secondary | ICD-10-CM | POA: Diagnosis not present

## 2019-12-07 DIAGNOSIS — E1159 Type 2 diabetes mellitus with other circulatory complications: Secondary | ICD-10-CM

## 2019-12-07 DIAGNOSIS — Z7189 Other specified counseling: Secondary | ICD-10-CM

## 2019-12-07 DIAGNOSIS — E7800 Pure hypercholesterolemia, unspecified: Secondary | ICD-10-CM | POA: Diagnosis not present

## 2019-12-07 DIAGNOSIS — E1122 Type 2 diabetes mellitus with diabetic chronic kidney disease: Secondary | ICD-10-CM | POA: Diagnosis not present

## 2019-12-07 DIAGNOSIS — I10 Essential (primary) hypertension: Secondary | ICD-10-CM

## 2019-12-07 DIAGNOSIS — E1129 Type 2 diabetes mellitus with other diabetic kidney complication: Secondary | ICD-10-CM | POA: Diagnosis not present

## 2019-12-07 DIAGNOSIS — E1121 Type 2 diabetes mellitus with diabetic nephropathy: Secondary | ICD-10-CM | POA: Diagnosis not present

## 2019-12-07 LAB — POCT GLYCOSYLATED HEMOGLOBIN (HGB A1C): Hemoglobin A1C: 7.1 % — AB (ref 4.0–5.6)

## 2019-12-07 NOTE — Progress Notes (Signed)
Established Patient Office Visit  Subjective:  Patient ID: [name], male    DOB: [birth year]  Age: 64 y.o. MRN: [id number]  CC:  Chief Complaint  Patient presents with  . Diabetes  . Hypertension  . Hyperlipidemia    HPI [name] presents for follow up on diabetes mellitus, hypertension and hyperlipidemia. Has no acute concerns.  Diabetes mellitus: Pt denies increased urination or thirst. Pt reports medication compliance. Reports a few episodes of hypoglycemia with over-exerting activities which subsides after eating something. Checking glucose at home. FBS range 130-140. Has been trying to watch his diet. States he applied for patient assistance to help with Victoza cost but unsure if will qualify and cannot afford medication.  HTN: Pt denies chest pain, palpitations, dizziness or lower extremity swelling. Taking medication as directed without side effects. Does not check BP at home. Pt reports good hydration.  HLD: Followed by Cardiology. Pt taking medication as directed without issues. Denies side effects. Reports is the cook in the family and with the Holidays can be challenging to monitor fats, carbohydrates and sweets.   Past Medical History:  Diagnosis Date  . Carpal tunnel syndrome, bilateral   . Carpal tunnel syndrome, right    nerve impingement right arm/wears brace  . Coronary artery disease   . Diabetes mellitus    no meds  . Dyspnea   . GERD (gastroesophageal reflux disease)   . Gout   . Hepatic steatosis   . Hyperlipidemia   . Hypertension   . Obstructive sleep apnea     Past Surgical History:  Procedure Laterality Date  . CARDIAC CATHETERIZATION    . CARPAL TUNNEL RELEASE  09/10/2015   RIGHT WRIST / WITH NERVE IMPINGEMENT SURGERY  . CERVICAL FUSION     Dr Sherwood Gambler  . COLONOSCOPY  2007   negative; [facility]  . CORONARY ANGIOGRAPHY N/A 10/25/2018   Procedure: CORONARY ANGIOGRAPHY (CATH LAB);  Surgeon: Sherren Mocha, MD;  Location: [facility];  Service: Cardiovascular;  Laterality: N/A;  . CORONARY ARTERY BYPASS GRAFT N/A 10/26/2018   Procedure: CORONARY ARTERY BYPASS GRAFTING (CABG) x3 using the LIMA to LAD and right greater saphenous vein via endoscopic harvest to the Diag 1 and OM 2.;  Surgeon: Lajuana Matte, MD;  Location: [facility];  Service: Open Heart Surgery;  Laterality: N/A;  . ESOPHAGEAL DILATION  2007  . 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 LUMBAR FUSION      Dr Sherwood Gambler    Family History  Problem Relation Age of Onset  . Heart disease Father        pacer  . Urolithiasis Father   . Heart attack Father   . Aneurysm Mother 92       CNS aneurysm  . Sudden death Mother   . Diabetes Sister   . Cancer Maternal Uncle        ? primary  . Cancer Paternal Uncle        ? primary  . Kidney disease Brother   . Heart disease Sister   . Heart disease Sister   . Hyperlipidemia Neg Hx   . Hypertension Neg Hx   . Colon cancer Neg Hx     Social History   Socioeconomic History  . Marital status: Married    Spouse name: Not on file  . Number of children: Not on file  . Years of education: Not on file  . Highest education  level: Not on file  Occupational History  . Occupation: Disabled  Tobacco Use  . Smoking status: Former Smoker    Packs/day: 1.50    Years: 4.00    Pack years: 6.00    Types: Cigarettes    Quit date: 01/06/1976    Years since quitting: 43.9  . Smokeless tobacco: Former Systems developer  . Tobacco comment: smoked 35 years ago as of 2013   Vaping Use  . Vaping Use: Never used  Substance and Sexual Activity  . Alcohol use: No  . Drug use: No  . Sexual activity: Yes    Birth control/protection: None  Other Topics Concern  . Not on file  Social History Narrative  . Not on file   Social Determinants of Health   Financial Resource Strain:   . Difficulty of Paying Living Expenses: Not on file  Food Insecurity:   . Worried About Sales executive in the Last Year: Not on file  . Ran Out of Food in the Last Year: Not on file  Transportation Needs: No Transportation Needs  . Lack of Transportation (Medical): No  . Lack of Transportation (Non-Medical): No  Physical Activity: Inactive  . Days of Exercise per Week: 0 days  . Minutes of Exercise per Session: 0 min  Stress: No Stress Concern Present  . Feeling of Stress : Only a little  Social Connections:   . Frequency of Communication with Friends and Family: Not on file  . Frequency of Social Gatherings with Friends and Family: Not on file  . Attends Religious Services: Not on file  . Active Member of Clubs or Organizations: Not on file  . Attends Archivist Meetings: Not on file  . Marital Status: Not on file  Intimate Partner Violence:   . Fear of Current or Ex-Partner: Not on file  . Emotionally Abused: Not on file  . Physically Abused: Not on file  . Sexually Abused: Not on file    Outpatient Medications Prior to Visit  Medication Sig Dispense Refill  . acetaminophen (TYLENOL) 500 MG tablet Take 2 tablets (1,000 mg total) by mouth every 6 (six) hours as needed. 30 tablet 0  . allopurinol (ZYLOPRIM) 300 MG tablet TAKE 1/2 TABLET BY MOUTH DAILY 45 tablet 0  . Ascorbic Acid (VITAMIN C) 1000 MG tablet Take 1,000 mg by mouth daily.    Marland Kitchen aspirin 81 MG tablet Take 81 mg by mouth daily.      Marland Kitchen atorvastatin (LIPITOR) 80 MG tablet Take 1 tablet (80 mg total) by mouth at bedtime. 90 tablet 3  . benazepril (LOTENSIN) 10 MG tablet Take 10 mg by mouth daily.    . blood glucose meter kit and supplies Dispense based on patient and insurance preference. Use to check fasting blood glucose in the morning and to check glucose 2 hours after largest meal of the day. (FOR ICD-10 E10.9, E11.9). 1 each 0  . FLUoxetine (PROZAC) 40 MG capsule Take 1 capsule (40 mg total) by mouth daily. **NEEDS APT FOR REFILLS** 60 capsule 0  . gabapentin (NEURONTIN) 800 MG tablet TAKE 1 TABLET BY MOUTH  3 TIMES DAILY 270 tablet 1  . glucose blood test strip Use as instructed 100 each 12  . Insulin Pen Needle (PEN NEEDLES) 31G X 8 MM MISC 1 each by Does not apply route daily. 90 each 0  . JARDIANCE 10 MG TABS tablet TAKE 1 TABLET BY MOUTH DAILY BEFORE BREAKFAST 30 tablet 7  . LANCETS  ULTRA FINE MISC Use to check fasting blood glucose in the morning and to check glucose 2 hours after largest meal of the day 100 each 12  . metoprolol succinate (TOPROL-XL) 25 MG 24 hr tablet Take 1 tablet (25 mg total) by mouth daily. 90 tablet 3  . Multiple Vitamins-Iron (MULTIVITAMINS WITH IRON) TABS Take 1 tablet by mouth daily. 30 tablet 0  . pantoprazole (PROTONIX) 40 MG tablet Take 1 tablet (40 mg total) by mouth daily. **NEEDS APT FOR REFILLS** 60 tablet 0  . polyethylene glycol powder (GLYCOLAX/MIRALAX) powder USE AS DIRECTED 850 g 11  . pyridoxine (B-6) 100 MG tablet Take 100 mg by mouth daily.    . traMADol (ULTRAM) 50 MG tablet Take 1-2 tablets (50-100 mg total) by mouth every 4 (four) hours as needed for moderate pain. 30 tablet 0  . VICTOZA 18 MG/3ML SOPN INJECT 0.6MG SUBCUTANEOUSLY ONCE DAILY FOR 7 DAYS, THEN INCREASE TO 1.2MG ONCE DAILY IF BLOOD SUGAR HAS NOT MET GOAL 6 mL 1  . Vitamin D, Ergocalciferol, (DRISDOL) 1.25 MG (50000 UNIT) CAPS capsule TAKE 1 CAPSULE BY MOUTH EVERY 7 DAYS 12 capsule 0  . desonide (DESOWEN) 0.05 % cream Apply topically 2 (two) times daily. (Patient not taking: Reported on 12/07/2019) 30 g 0  . triamcinolone (KENALOG) 0.025 % ointment Apply 1 application topically at bedtime as needed. Do not use more than 5 days (Patient not taking: Reported on 12/07/2019) 30 g 0   No facility-administered medications prior to visit.    Allergies  Allergen Reactions  . Prednisone Other (See Comments)    Caused blood sugar to increase, pt will not take    ROS Review of Systems A fourteen system review of systems was performed and found to be positive as per HPI.   Objective:     Physical Exam General:  Well Developed, well nourished, appropriate for stated age.  Neuro:  Alert and oriented,  extra-ocular muscles intact  HEENT:  Normocephalic, atraumatic, neck supple Skin:  no gross rash, warm, pink. Cardiac:  RRR, S1 S2 Respiratory:  ECTA B/L and A/P, Not using accessory muscles, speaking in full sentences- unlabored. Vascular:  Ext warm, no cyanosis apprec.; cap RF less 2 sec. Psych:  No HI/SI, judgement and insight good, Euthymic mood. Full Affect.  BP 116/63   Pulse 64   Ht 5' 11.5" (1.816 m)   Wt 237 lb 12.8 oz (107.9 kg)   SpO2 96%   BMI 32.70 kg/m  Wt Readings from Last 3 Encounters:  12/07/19 238 lb 12.8 oz (108.3 kg)  12/07/19 237 lb 12.8 oz (107.9 kg)  06/06/19 235 lb 6.4 oz (106.8 kg)     Health Maintenance Due  Topic Date Due  . Hepatitis C Screening  Never done  . TETANUS/TDAP  08/03/2018  . OPHTHALMOLOGY EXAM  10/06/2018  . INFLUENZA VACCINE  08/06/2019    There are no preventive care reminders to display for this patient.  Lab Results  Component Value Date   TSH 3.370 02/09/2018   Lab Results  Component Value Date   WBC 5.4 12/14/2018   HGB 12.1 (L) 12/14/2018   HCT 38.7 12/14/2018   MCV 86 12/14/2018   PLT 269 12/14/2018   Lab Results  Component Value Date   NA 140 04/03/2019   K 5.0 04/03/2019   CO2 24 04/03/2019   GLUCOSE 134 (H) 04/03/2019   BUN 29 (H) 04/03/2019   CREATININE 1.78 (H) 04/03/2019   BILITOT 0.4 12/05/2019  ALKPHOS 125 (H) 12/05/2019   AST 32 12/05/2019   ALT 34 12/05/2019   PROT 6.9 12/05/2019   ALBUMIN 4.3 12/05/2019   CALCIUM 9.7 04/03/2019   ANIONGAP 10 10/31/2018   GFR 58.84 (L) 04/17/2016   Lab Results  Component Value Date   CHOL 149 12/05/2019   Lab Results  Component Value Date   HDL 39 (L) 12/05/2019   Lab Results  Component Value Date   LDLCALC 80 12/05/2019   Lab Results  Component Value Date   TRIG 174 (H) 12/05/2019   Lab Results  Component Value Date   CHOLHDL  3.8 12/05/2019   Lab Results  Component Value Date   HGBA1C 7.1 (A) 12/07/2019      Assessment & Plan:   Problem List Items Addressed This Visit      Cardiovascular and Mediastinum   Hypertension associated with diabetes ([place]) (Chronic)    -Controlled. -Continue current medication regimen. -Continue to stay hydrated and monitor sodium. -Will continue to monitor.        Endocrine   Hyperlipidemia associated with type 2 diabetes mellitus ([facility]) (Chronic)    -Most recent lipid panel: total cholesterol 149, triglycerides 174, HDL 39, LDL 80 (goal <70). -Continue with high dose Atorvastatin.  -Recommend to reduce simple carbohydrates, saturated and trans fats. -Stay as active as possible. -Will continue to monitor alongside cardiology.      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      Type 2 diabetes mellitus with renal complication ([facility]) - Primary    -A1c has improve from 7.7 to 7.1. -Recommend to continue ambulatory glucose monitoring. -Continue current medication regimen. Advised patient pending patient assistance determination will make medication adjustments if needed that will be more cost affordable. -Recommend to monitor carbohydrates and glucose. -Will continue to monitor.      Relevant Orders   POCT glycosylated hemoglobin (Hb A1C) (Completed)      No orders of the defined types were placed in this encounter.   Follow-up: Return in about 4 months (around 04/06/2020) for CPE and FBW.   Note:  This note was prepared with assistance of Dragon voice recognition software. Occasional wrong-word or sound-a-like substitutions may have occurred due to the inherent limitations of voice recognition software.   Lorrene Reid, PA-C

## 2019-12-07 NOTE — Patient Instructions (Signed)

## 2019-12-07 NOTE — Assessment & Plan Note (Signed)
-  Most recent lipid panel: total cholesterol 149, triglycerides 174, HDL 39, LDL 80 (goal <70). -Continue with high dose Atorvastatin.  -Recommend to reduce simple carbohydrates, saturated and trans fats. -Stay as active as possible. -Will continue to monitor alongside cardiology.

## 2019-12-07 NOTE — Assessment & Plan Note (Addendum)
-  A1c has improve from 7.7 to 7.1. -Recommend to continue ambulatory glucose monitoring. -Continue current medication regimen. Advised patient pending patient assistance determination will make medication adjustments if needed that will be more cost affordable. -Recommend to monitor carbohydrates and glucose. -Will continue to monitor.

## 2019-12-07 NOTE — Assessment & Plan Note (Signed)
-  Controlled. -Continue current medication regimen. -Continue to stay hydrated and monitor sodium. -Will continue to monitor.

## 2019-12-07 NOTE — Assessment & Plan Note (Signed)
-  Followed by Nephrology.

## 2019-12-07 NOTE — Patient Instructions (Signed)
Medication Instructions:  Your physician recommends that you continue on your current medications as directed. Please refer to the Current Medication list given to you today.  *If you need a refill on your cardiac medications before your next appointment, please call your pharmacy*   Lab Work: None If you have labs (blood work) drawn today and your tests are completely normal, you will receive your results only by: . MyChart Message (if you have MyChart) OR . A paper copy in the mail If you have any lab test that is abnormal or we need to change your treatment, we will call you to review the results.   Testing/Procedures: None   Follow-Up: At [facility], you and your health needs are our priority.  As part of our continuing mission to provide you with exceptional heart care, we have created designated Provider Care Teams.  These Care Teams include your primary Cardiologist (physician) and Advanced Practice Providers (APPs -  Physician Assistants and Nurse Practitioners) who all work together to provide you with the care you need, when you need it.  We recommend signing up for the patient portal called "MyChart".  Sign up information is provided on this After Visit Summary.  MyChart is used to connect with patients for Virtual Visits (Telemedicine).  Patients are able to view lab/test results, encounter notes, upcoming appointments, etc.  Non-urgent messages can be sent to your provider as well.   To learn more about what you can do with MyChart, go to [email or web address].    Your next appointment:   12 month(s)  The format for your next appointment:   In Person  Provider:   You may see Henry W Smith III, MD or one of the following Advanced Practice Providers on your designated Care Team:    Lori Gerhardt, NP  Laura Ingold, NP  Jill McDaniel, NP    Other Instructions  Your provider recommends that you maintain 150 minutes per week of moderate aerobic activity.   

## 2019-12-19 DIAGNOSIS — M25552 Pain in left hip: Secondary | ICD-10-CM | POA: Diagnosis not present

## 2019-12-19 DIAGNOSIS — M545 Low back pain, unspecified: Secondary | ICD-10-CM | POA: Diagnosis not present

## 2020-01-09 ENCOUNTER — Other Ambulatory Visit: Payer: Self-pay | Admitting: Physician Assistant

## 2020-01-09 DIAGNOSIS — E559 Vitamin D deficiency, unspecified: Secondary | ICD-10-CM

## 2020-01-16 ENCOUNTER — Other Ambulatory Visit: Payer: Self-pay | Admitting: Physician Assistant

## 2020-02-05 ENCOUNTER — Other Ambulatory Visit: Payer: Self-pay | Admitting: Physician Assistant

## 2020-02-21 DIAGNOSIS — M47816 Spondylosis without myelopathy or radiculopathy, lumbar region: Secondary | ICD-10-CM | POA: Diagnosis not present

## 2020-02-26 ENCOUNTER — Other Ambulatory Visit: Payer: Self-pay | Admitting: Physician Assistant

## 2020-02-26 ENCOUNTER — Other Ambulatory Visit: Payer: Self-pay | Admitting: Interventional Cardiology

## 2020-02-26 DIAGNOSIS — E1169 Type 2 diabetes mellitus with other specified complication: Secondary | ICD-10-CM

## 2020-02-26 DIAGNOSIS — E1129 Type 2 diabetes mellitus with other diabetic kidney complication: Secondary | ICD-10-CM

## 2020-02-26 DIAGNOSIS — R809 Proteinuria, unspecified: Secondary | ICD-10-CM

## 2020-03-13 DIAGNOSIS — M47817 Spondylosis without myelopathy or radiculopathy, lumbosacral region: Secondary | ICD-10-CM | POA: Diagnosis not present

## 2020-03-14 DIAGNOSIS — E119 Type 2 diabetes mellitus without complications: Secondary | ICD-10-CM | POA: Diagnosis not present

## 2020-03-14 DIAGNOSIS — H40033 Anatomical narrow angle, bilateral: Secondary | ICD-10-CM | POA: Diagnosis not present

## 2020-03-19 ENCOUNTER — Telehealth: Payer: Self-pay | Admitting: Physician Assistant

## 2020-03-20 ENCOUNTER — Ambulatory Visit (INDEPENDENT_AMBULATORY_CARE_PROVIDER_SITE_OTHER): Payer: Medicare Other | Admitting: Nurse Practitioner

## 2020-03-20 ENCOUNTER — Encounter: Payer: Self-pay | Admitting: Nurse Practitioner

## 2020-03-20 ENCOUNTER — Other Ambulatory Visit: Payer: Self-pay

## 2020-03-20 VITALS — BP 122/78 | HR 67 | Temp 99.2°F | Ht 71.0 in | Wt 233.8 lb

## 2020-03-20 DIAGNOSIS — E119 Type 2 diabetes mellitus without complications: Secondary | ICD-10-CM

## 2020-03-20 DIAGNOSIS — R1011 Right upper quadrant pain: Secondary | ICD-10-CM | POA: Insufficient documentation

## 2020-03-20 LAB — POCT URINALYSIS DIPSTICK
Bilirubin, UA: NEGATIVE
Blood, UA: NEGATIVE
Glucose, UA: POSITIVE — AB
Ketones, UA: NEGATIVE
Leukocytes, UA: NEGATIVE
Nitrite, UA: NEGATIVE
Protein, UA: NEGATIVE
Spec Grav, UA: 1.015 (ref 1.010–1.025)
Urobilinogen, UA: 0.2 E.U./dL
pH, UA: 5.5 (ref 5.0–8.0)

## 2020-03-20 NOTE — Telephone Encounter (Signed)
pattient scheduled

## 2020-03-20 NOTE — Progress Notes (Signed)
Acute Office Visit  Subjective:    Patient ID: [name], male    DOB: [birth year], 65 y.o.   MRN: [id number]  Chief Complaint  Patient presents with  . Abdominal Pain    HPI Patient is in today for evaluation of abdominal pain. He states that pain is mostly across the right upper quadrant of the abdomen. When he moves in specific direction, usually to the left and lifting his right arm up, he gets severe, stabbing pain through the right flank and right upper quadrant. Pain is so severe he has to stop what he is doing until pain eases off. He states the when pain occurs, he also gets increased amount of gas and has to burp multiple times. He denies nausea and vomiting. He states that his appetite is ok. There are no specific foods which are triggering these symptoms. He denies constipation or diarrhea. States that bowel habits have not changed. He does have low grade fever today. He denies dysuria, urinary frequency or urgency. He denies hematuria.   Past Medical History:  Diagnosis Date  . Carpal tunnel syndrome, bilateral   . Carpal tunnel syndrome, right    nerve impingement right arm/wears brace  . Coronary artery disease   . Diabetes mellitus    no meds  . Dyspnea   . GERD (gastroesophageal reflux disease)   . Gout   . Hepatic steatosis   . Hyperlipidemia   . Hypertension   . Obstructive sleep apnea     Past Surgical History:  Procedure Laterality Date  . CARDIAC CATHETERIZATION    . CARPAL TUNNEL RELEASE  09/10/2015   RIGHT WRIST / WITH NERVE IMPINGEMENT SURGERY  . CERVICAL FUSION     Dr Sherwood Gambler  . COLONOSCOPY  2007   negative;  GI  . CORONARY ANGIOGRAPHY N/A 10/25/2018   Procedure: CORONARY ANGIOGRAPHY (CATH LAB);  Surgeon: Sherren Mocha, MD;  Location: [place] [facility];  Service: Cardiovascular;  Laterality: N/A;  . CORONARY ARTERY BYPASS GRAFT N/A 10/26/2018   Procedure: CORONARY ARTERY BYPASS GRAFTING (CABG) x3 using the LIMA to LAD and right  greater saphenous vein via endoscopic harvest to the Diag 1 and OM 2.;  Surgeon: Lajuana Matte, MD;  Location: [place];  Service: Open Heart Surgery;  Laterality: N/A;  . ESOPHAGEAL DILATION  2007  . 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 LUMBAR FUSION      Dr Sherwood Gambler    Family History  Problem Relation Age of Onset  . Heart disease Father        pacer  . Urolithiasis Father   . Heart attack Father   . Aneurysm Mother 68       CNS aneurysm  . Sudden death Mother   . Diabetes Sister   . Cancer Maternal Uncle        ? primary  . Cancer Paternal Uncle        ? primary  . Kidney disease Brother   . Heart disease Sister   . Heart disease Sister   . Hyperlipidemia Neg Hx   . Hypertension Neg Hx   . Colon cancer Neg Hx     Social History   Socioeconomic History  . Marital status: Married    Spouse name: Not on file  . Number of children: Not on file  . Years of education: Not on file  . Highest education level: Not on file  Occupational History  .  Occupation: Disabled  Tobacco Use  . Smoking status: Former Smoker    Packs/day: 1.50    Years: 4.00    Pack years: 6.00    Types: Cigarettes    Quit date: 01/06/1976    Years since quitting: 44.2  . Smokeless tobacco: Former Systems developer  . Tobacco comment: smoked 35 years ago as of 2013   Vaping Use  . Vaping Use: Never used  Substance and Sexual Activity  . Alcohol use: No  . Drug use: No  . Sexual activity: Yes    Birth control/protection: None  Other Topics Concern  . Not on file  Social History Narrative  . Not on file   Social Determinants of Health   Financial Resource Strain: Not on file  Food Insecurity: Not on file  Transportation Needs: Not on file  Physical Activity: Not on file  Stress: Not on file  Social Connections: Not on file  Intimate Partner Violence: Not on file    Outpatient Medications Prior to Visit  Medication Sig Dispense Refill  . acetaminophen  (TYLENOL) 500 MG tablet Take 2 tablets (1,000 mg total) by mouth every 6 (six) hours as needed. 30 tablet 0  . allopurinol (ZYLOPRIM) 300 MG tablet TAKE 1/2 TABLET BY MOUTH DAILY 45 tablet 0  . Ascorbic Acid (VITAMIN C) 1000 MG tablet Take 1,000 mg by mouth daily.    Marland Kitchen aspirin 81 MG tablet Take 81 mg by mouth daily.    Marland Kitchen atorvastatin (LIPITOR) 80 MG tablet Take 1 tablet (80 mg total) by mouth at bedtime. 90 tablet 3  . benazepril (LOTENSIN) 10 MG tablet TAKE 1 TABLET BY MOUTH DAILY 90 tablet 3  . blood glucose meter kit and supplies Dispense based on patient and insurance preference. Use to check fasting blood glucose in the morning and to check glucose 2 hours after largest meal of the day. (FOR ICD-10 E10.9, E11.9). 1 each 0  . FLUoxetine (PROZAC) 40 MG capsule TAKE 1 CAPSULE BY MOUTH DAILY 60 capsule 0  . gabapentin (NEURONTIN) 800 MG tablet TAKE 1 TABLET BY MOUTH 3 TIMES DAILY 270 tablet 1  . glucose blood test strip Use as instructed 100 each 12  . Insulin Pen Needle (PEN NEEDLES) 31G X 8 MM MISC 1 each by Does not apply route daily. 90 each 0  . JARDIANCE 10 MG TABS tablet TAKE 1 TABLET BY MOUTH DAILY BEFORE BREAKFAST 30 tablet 7  . LANCETS ULTRA FINE MISC Use to check fasting blood glucose in the morning and to check glucose 2 hours after largest meal of the day 100 each 12  . metoprolol succinate (TOPROL-XL) 25 MG 24 hr tablet Take 1 tablet (25 mg total) by mouth daily. 90 tablet 3  . Multiple Vitamins-Iron (MULTIVITAMINS WITH IRON) TABS Take 1 tablet by mouth daily. 30 tablet 0  . pantoprazole (PROTONIX) 40 MG tablet TAKE 1 TABLET BY MOUTH DAILY 60 tablet 0  . polyethylene glycol powder (GLYCOLAX/MIRALAX) powder USE AS DIRECTED 850 g 11  . pyridoxine (B-6) 100 MG tablet Take 100 mg by mouth daily.    . traMADol (ULTRAM) 50 MG tablet Take 1-2 tablets (50-100 mg total) by mouth every 4 (four) hours as needed for moderate pain. 30 tablet 0  . VICTOZA 18 MG/3ML SOPN INJECT 0.6MG  SUBCUTANEOUSLY ONCE DAILY FOR 7 DAYS, THEN INCREASE TO 1.2MG ONCE DAILY IF BLOOD SUGAR HAS NOT MET GOAL 6 mL 2  . Vitamin D, Ergocalciferol, (DRISDOL) 1.25 MG (50000 UNIT) CAPS capsule  TAKE 1 CAPSULE BY MOUTH EVERY 7 DAYS 12 capsule 0   No facility-administered medications prior to visit.    Allergies  Allergen Reactions  . Prednisone Other (See Comments)    Caused blood sugar to increase, pt will not take    Review of Systems  Constitutional: Positive for activity change. Negative for appetite change, chills and fever.       Patient limiting activity because of abdominal pain.   HENT: Negative for congestion and sinus pain.   Respiratory: Negative for cough, shortness of breath and wheezing.   Cardiovascular: Negative for chest pain and palpitations.  Gastrointestinal: Positive for abdominal distention and abdominal pain. Negative for blood in stool, constipation, diarrhea, nausea and vomiting.  Genitourinary: Positive for flank pain. Negative for dysuria, frequency, hematuria and urgency.  Musculoskeletal: Positive for back pain. Negative for myalgias.  Skin: Negative for rash.  Neurological: Negative for dizziness, weakness and headaches.  Psychiatric/Behavioral: The patient is not nervous/anxious.   All other systems reviewed and are negative.      Objective:    Physical Exam Vitals and nursing note reviewed.  Constitutional:      Appearance: Normal appearance. He is well-developed. He is ill-appearing.  HENT:     Head: Normocephalic and atraumatic.     Nose: Nose normal.  Eyes:     Pupils: Pupils are equal, round, and reactive to light.  Cardiovascular:     Rate and Rhythm: Normal rate and regular rhythm.     Pulses: Normal pulses.     Heart sounds: Normal heart sounds.  Pulmonary:     Effort: Pulmonary effort is normal.     Breath sounds: Normal breath sounds.  Abdominal:     General: Bowel sounds are normal.     Palpations: Abdomen is soft. There is no mass.      Tenderness: There is abdominal tenderness. There is no guarding.     Comments: Currently, there is mild abdominal soreness with light to medium palpation.   Genitourinary:    Comments: Right flank tenderness present. His u/a is positive for glucose which is expected finding as he is taking Jardiance for DM2 Musculoskeletal:        General: Normal range of motion.     Cervical back: Normal range of motion and neck supple.  Skin:    General: Skin is warm and dry.  Neurological:     General: No focal deficit present.     Mental Status: He is alert and oriented to person, place, and time.  Psychiatric:        Mood and Affect: Mood normal.        Behavior: Behavior normal.        Thought Content: Thought content normal.        Judgment: Judgment normal.     Today's Vitals   03/20/20 1117  BP: 122/78  Pulse: 67  Temp: 99.2 F (37.3 C)  SpO2: 97%  Weight: 233 lb 12.8 oz (106.1 kg)  Height: _0  (1.803 m)   Body mass index is 32.61 kg/m.    Wt Readings from Last 3 Encounters:  03/20/20 233 lb 12.8 oz (106.1 kg)  12/07/19 238 lb 12.8 oz (108.3 kg)  12/07/19 237 lb 12.8 oz (107.9 kg)    Health Maintenance Due  Topic Date Due  . Hepatitis C Screening  Never done  . TETANUS/TDAP  08/03/2018  . OPHTHALMOLOGY EXAM  10/06/2018    There are no preventive care reminders to display  for this patient.   Lab Results  Component Value Date   TSH 3.370 02/09/2018   Lab Results  Component Value Date   WBC 5.4 12/14/2018   HGB 12.1 (L) 12/14/2018   HCT 38.7 12/14/2018   MCV 86 12/14/2018   PLT 269 12/14/2018   Lab Results  Component Value Date   NA 140 04/03/2019   K 5.0 04/03/2019   CO2 24 04/03/2019   GLUCOSE 134 (H) 04/03/2019   BUN 29 (H) 04/03/2019   CREATININE 1.78 (H) 04/03/2019   BILITOT 0.4 12/05/2019   ALKPHOS 125 (H) 12/05/2019   AST 32 12/05/2019   ALT 34 12/05/2019   PROT 6.9 12/05/2019   ALBUMIN 4.3 12/05/2019   CALCIUM 9.7 04/03/2019   ANIONGAP 10  10/31/2018   GFR 58.84 (L) 04/17/2016   Lab Results  Component Value Date   CHOL 149 12/05/2019   Lab Results  Component Value Date   HDL 39 (L) 12/05/2019   Lab Results  Component Value Date   LDLCALC 80 12/05/2019   Lab Results  Component Value Date   TRIG 174 (H) 12/05/2019   Lab Results  Component Value Date   CHOLHDL 3.8 12/05/2019   Lab Results  Component Value Date   HGBA1C 7.1 (A) 12/07/2019       Assessment & Plan:  1. Right upper quadrant abdominal pain Evaluation or urine sample is positive for glucose which is expected finding as he is taking Jardiance for DM2. Concern for acute cholecystitis. Will get urgent ultrasound of the right upper quadrant of the abdomen. Will notify patient of results when results are available and refer as indicated.  - US Abdomen Limited RUQ (LIVER/GB); Future - POCT Urinalysis Dipstick  2. Diabetes mellitus without complication ([place]) Patient on Jardiance resulting in glucose present in urine. No changes to medications today. Monitor closely,    Problem List Items Addressed This Visit      Endocrine   Diabetes mellitus without complication ([place])     Other   Right upper quadrant abdominal pain - Primary   Relevant Orders   US Abdomen Limited RUQ (LIVER/GB)   POCT Urinalysis Dipstick (Completed)      Time spent with the patient was approximately 30 minutes. This time included reviewing progress notes, labs, imaging studies, and discussing plan for follow up.    Ronnell Freshwater, NP

## 2020-03-20 NOTE — Patient Instructions (Signed)
Abdominal Pain, Adult Many things can cause belly (abdominal) pain. Most times, belly pain is not dangerous. Many cases of belly pain can be watched and treated at home. Sometimes, though, belly pain is serious. Your doctor will try to find the cause of your belly pain. Follow these instructions at home: Medicines  Take over-the-counter and prescription medicines only as told by your doctor.  Do not take medicines that help you poop (laxatives) unless told by your doctor. General instructions  Watch your belly pain for any changes.  Drink enough fluid to keep your pee (urine) pale yellow.  Keep all follow-up visits as told by your doctor. This is important.   Contact a doctor if:  Your belly pain changes or gets worse.  You are not hungry, or you lose weight without trying.  You are having trouble pooping (constipated) or have watery poop (diarrhea) for more than 2-3 days.  You have pain when you pee or poop.  Your belly pain wakes you up at night.  Your pain gets worse with meals, after eating, or with certain foods.  You are vomiting and cannot keep anything down.  You have a fever.  You have blood in your pee. Get help right away if:  Your pain does not go away as soon as your doctor says it should.  You cannot stop vomiting.  Your pain is only in areas of your belly, such as the right side or the left lower part of the belly.  You have bloody or black poop, or poop that looks like tar.  You have very bad pain, cramping, or bloating in your belly.  You have signs of not having enough fluid or water in your body (dehydration), such as: ? Dark pee, very little pee, or no pee. ? Cracked lips. ? Dry mouth. ? Sunken eyes. ? Sleepiness. ? Weakness.  You have trouble breathing or chest pain. Summary  Many cases of belly pain can be watched and treated at home.  Watch your belly pain for any changes.  Take over-the-counter and prescription medicines only as  told by your doctor.  Contact a doctor if your belly pain changes or gets worse.  Get help right away if you have very bad pain, cramping, or bloating in your belly. This information is not intended to replace advice given to you by your health care provider. Make sure you discuss any questions you have with your health care provider. Document Revised: 05/02/2018 Document Reviewed: 05/02/2018 Elsevier Patient Education  2021 [place].  Abdominal Bloating When you have abdominal bloating, your abdomen may feel full, tight, or painful. It may also look bigger than normal or swollen (distended). Common causes of abdominal bloating include:  Swallowing air.  Constipation.  Problems digesting food.  Eating too much.  Irritable bowel syndrome. This is a condition that affects the large intestine.  Lactose intolerance. This is an inability to digest lactose, a natural sugar in dairy products.  Celiac disease. This is a condition that affects the ability to digest gluten, a protein found in some grains.  Gastroparesis. This is a condition that slows down the movement of food in the stomach and small intestine. It is more common in people with diabetes mellitus.  Gastroesophageal reflux disease (GERD). This is a digestive condition that makes stomach acid flow back into the esophagus.  Urinary retention. This means that the body is holding onto urine, and the bladder cannot be emptied all the way. Follow these instructions at  home: Eating and drinking  Avoid eating too much.  Try not to swallow air while talking or eating.  Avoid eating while lying down.  Avoid these foods and drinks: ? Foods that cause gas, such as broccoli, cabbage, cauliflower, and baked beans. ? Carbonated drinks. ? Hard candy. ? Chewing gum. Medicines  Take over-the-counter and prescription medicines only as told by your health care provider.  Take probiotic medicines. These medicines contain live  bacteria or yeasts that can help digestion.  Take coated peppermint oil capsules. Activity  Try to exercise regularly. Exercise may help to relieve bloating that is caused by gas and relieve constipation. General instructions  Keep all follow-up visits as told by your health care provider. This is important. Contact a health care provider if:  You have nausea and vomiting.  You have diarrhea.  You have abdominal pain.  You have unusual weight loss or weight gain.  You have severe pain, and medicines do not help. Get help right away if:  You have severe chest pain.  You have trouble breathing.  You have shortness of breath.  You have trouble urinating.  You have darker urine than normal.  You have blood in your stools or have dark, tarry stools. Summary  Abdominal bloating means that the abdomen is swollen.  Common causes of abdominal bloating are swallowing air, constipation, and problems digesting food.  Avoid eating too much and avoid swallowing air.  Avoid foods that cause gas, carbonated drinks, hard candy, and chewing gum. This information is not intended to replace advice given to you by your health care provider. Make sure you discuss any questions you have with your health care provider. Document Revised: 04/11/2018 Document Reviewed: 01/24/2016 Elsevier Patient Education  2021 [employer].

## 2020-03-21 ENCOUNTER — Ambulatory Visit
Admission: RE | Admit: 2020-03-21 | Discharge: 2020-03-21 | Disposition: A | Discharge status: Home / Self Care | Payer: Medicare Other | Source: Ambulatory Visit | Attending: Nurse Practitioner | Admitting: Nurse Practitioner

## 2020-03-21 ENCOUNTER — Other Ambulatory Visit: Payer: Self-pay | Admitting: Nurse Practitioner

## 2020-03-21 ENCOUNTER — Other Ambulatory Visit: Payer: Self-pay | Admitting: Physician Assistant

## 2020-03-21 DIAGNOSIS — K760 Fatty (change of) liver, not elsewhere classified: Secondary | ICD-10-CM | POA: Diagnosis not present

## 2020-03-21 DIAGNOSIS — R809 Proteinuria, unspecified: Secondary | ICD-10-CM

## 2020-03-21 DIAGNOSIS — R1011 Right upper quadrant pain: Secondary | ICD-10-CM

## 2020-03-21 DIAGNOSIS — E1169 Type 2 diabetes mellitus with other specified complication: Secondary | ICD-10-CM

## 2020-03-21 DIAGNOSIS — K819 Cholecystitis, unspecified: Secondary | ICD-10-CM

## 2020-03-21 DIAGNOSIS — E1129 Type 2 diabetes mellitus with other diabetic kidney complication: Secondary | ICD-10-CM

## 2020-03-21 MED ORDER — VICTOZA 18 MG/3ML ~~LOC~~ SOPN: PEN_INJECTOR | SUBCUTANEOUS | 0 refills | Status: DC

## 2020-03-21 NOTE — Progress Notes (Signed)
Ultrasound done 03/21/2020 showing small gallbladder wall polyp and sludge noted within the gallbladder. Suspect cholecystitis. An urgent referral was made for patent to have consultation with general surgery for further evaluation and treatment

## 2020-03-21 NOTE — Progress Notes (Signed)
Suspect cholecystitis. An urgent referral was made for patent to have consultation with general surgery for further evaluation and treatment

## 2020-03-22 ENCOUNTER — Telehealth: Payer: Self-pay | Admitting: Physician Assistant

## 2020-03-22 NOTE — Telephone Encounter (Signed)
Patient is scheduled with the general surgeon on 3/24- patient is still in pain, has questions about the scan that was done and what are the findings. Can he travel? Please call patient and wife at the [phone]. Thank you

## 2020-03-25 ENCOUNTER — Telehealth: Payer: Self-pay | Admitting: *Deleted

## 2020-03-25 ENCOUNTER — Telehealth: Payer: Self-pay

## 2020-03-25 ENCOUNTER — Ambulatory Visit: Payer: Medicare Other | Admitting: Surgery

## 2020-03-25 ENCOUNTER — Telehealth: Payer: Self-pay | Admitting: Physician Assistant

## 2020-03-25 ENCOUNTER — Other Ambulatory Visit: Payer: Self-pay

## 2020-03-25 ENCOUNTER — Encounter: Payer: Self-pay | Admitting: Surgery

## 2020-03-25 VITALS — BP 120/82 | HR 67 | Temp 98.7°F | Ht 71.0 in | Wt 236.2 lb

## 2020-03-25 DIAGNOSIS — K8050 Calculus of bile duct without cholangitis or cholecystitis without obstruction: Secondary | ICD-10-CM

## 2020-03-25 NOTE — Telephone Encounter (Signed)
   [facility] Pre-operative Risk Assessment    HEARTCARE STAFF: - Please ensure there is not already an duplicate clearance open for this procedure. - Under Visit Info/Reason for Call, type in Other and utilize the format Clearance MM/DD/YY or Clearance TBD. Do not use dashes or single digits. - If request is for dental extraction, please clarify the # of teeth to be extracted.  Request for surgical clearance:  1. What type of surgery is being performed? LAP CHOLE   2. When is this surgery scheduled? 04/03/20   3. What type of clearance is required (medical clearance vs. Pharmacy clearance to hold med vs. Both)? MEDICAL  4. Are there any medications that need to be held prior to surgery and how long? ASA    5. Practice name and name of physician performing surgery? [facility]; DR. Jacqulyn Bath PISCOYA  6. What is the office phone number? [phone]   7.   What is the office fax number? [phone]  8.   Anesthesia type (None, local, MAC, general) ? NOT LISTED   Julaine Hua 03/25/2020, 4:25 PM  _________________________________________________________________   (provider comments below)

## 2020-03-25 NOTE — Telephone Encounter (Signed)
Hey. Can you let the patient know it is ok for him to travel, though he should be careful. If pain gets more severe, he should be seen in ER or urgent care. I do think his appointment with the surgeon is before he plans to travel, but I can't remember when he was travelling and to where. Cn you find out what questions he had for me? Thanks.

## 2020-03-25 NOTE — Patient Instructions (Signed)
Last dose of Aspirin 03/28/20. I will send the Cardiac and Medical clearance-Please call your doctors to see if you need an appointment prior to signing off on the clearances.   Our surgery scheduler will call to schedule your surgery within 24-48 hours to schedule your surgery.

## 2020-03-25 NOTE — Telephone Encounter (Signed)
   Primary Cardiologist: Sinclair Grooms, MD  Chart reviewed as part of pre-operative protocol coverage. Patient was contacted 03/25/2020 in reference to pre-operative risk assessment for pending surgery as outlined below.  [name] was last seen on 12/2019 by Dr. Tamala Julian - pertinent cardiac history includes CAD s/p CABG 10/2018, DM, HTN, HLD, CKD amongst other medical history outlined. EF 50-55% by intraop TEE. Last Cr 1.88 by [facility]. RCRI 6.6% indicating moderate risk of CV complications. I reached out to patient for update on how he is doing. The patient affirms he has been doing well without any new cardiac symptoms. He has been very active remodeling his house and laying down floors and painting, etc, without any cardiac symptoms - achieves >4 METS. Therefore, based on ACC/AHA guidelines, the patient would be at acceptable risk for the planned procedure without further cardiovascular testing. The patient was advised that if he develops new symptoms prior to surgery to contact our office to arrange for a follow-up visit, and he verbalized understanding.  I will route to Dr. Tamala Julian for input on holding ASA for cholecystectomy as requested (duration not listed but usually 5-7 days). Dr. Tamala Julian - Please route response to P CV DIV PREOP (the pre-op pool). Thank you.   Charlie Pitter, PA-C 03/25/2020, 4:45 PM

## 2020-03-25 NOTE — Telephone Encounter (Signed)
Patient scheduled.

## 2020-03-25 NOTE — Telephone Encounter (Signed)
Called pt no answer no VM set up to leave a message

## 2020-03-25 NOTE — H&P (View-Only) (Signed)
03/25/2020  Reason for Visit:  Biliary colic  Referring Provider:  Lorrene Reid, PA-C  History of Present Illness: [name] is a 65 y.o. male presenting for evaluation of symptomatic cholelithiasis.  The patient was seen by his PCP's office on 3/16 due to RUQ abdominal pain.  The pain was radiating towards his back.  Not associated with nausea or vomiting, but he does reports increase in burping during the episodes.  The pain does worsen with po intake, though he has not been eating anything particularly greasy or fatty.  Denies any fevers but does report having sweats during the episodes.  Denies any chest pain, shortness of breath, constipation or diarrhea.  He had an U/S on 3/17 which showed a 5 mm polyp in the gallbladder and sludge, without any gallbladder wall thickening or pericholecystic fluid.    Today, the patient reports that he continues to have some discomfort which spikes with po intake.  He's still able to eat though.    He does have a history of CABGx3 in 10/2018 and he saw his cardiologist about 3 months ago.  He was doing well and his next follow up was set in 12 months at the time.    Past Medical History: Past Medical History:  Diagnosis Date  . Carpal tunnel syndrome, bilateral   . Carpal tunnel syndrome, right    nerve impingement right arm/wears brace  . Coronary artery disease   . Diabetes mellitus    no meds  . Dyspnea   . GERD (gastroesophageal reflux disease)   . Gout   . Hepatic steatosis   . Hyperlipidemia   . Hypertension   . Obstructive sleep apnea      Past Surgical History: Past Surgical History:  Procedure Laterality Date  . CARDIAC CATHETERIZATION    . CARPAL TUNNEL RELEASE  09/10/2015   RIGHT WRIST / WITH NERVE IMPINGEMENT SURGERY  . CERVICAL FUSION     Dr Sherwood Gambler  . COLONOSCOPY  2007   negative; [facility]  . CORONARY ANGIOGRAPHY N/A 10/25/2018   Procedure: CORONARY ANGIOGRAPHY (CATH LAB);  Surgeon: Sherren Mocha, MD;   Location: [facility];  Service: Cardiovascular;  Laterality: N/A;  . CORONARY ARTERY BYPASS GRAFT N/A 10/26/2018   Procedure: CORONARY ARTERY BYPASS GRAFTING (CABG) x3 using the LIMA to LAD and right greater saphenous vein via endoscopic harvest to the Diag 1 and OM 2.;  Surgeon: Lajuana Matte, MD;  Location: [facility];  Service: Open Heart Surgery;  Laterality: N/A;  . ESOPHAGEAL DILATION  2007  . 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 LUMBAR FUSION      Dr Sherwood Gambler    Home Medications: Prior to Admission medications   Medication Sig Start Date End Date Taking? Authorizing Provider  acetaminophen (TYLENOL) 500 MG tablet Take 2 tablets (1,000 mg total) by mouth every 6 (six) hours as needed. 10/31/18  Yes Barrett, Erin R, PA-C  allopurinol (ZYLOPRIM) 300 MG tablet TAKE 1/2 TABLET BY MOUTH DAILY 01/16/20  Yes Abonza, Maritza, PA-C  Ascorbic Acid (VITAMIN C) 1000 MG tablet Take 1,000 mg by mouth daily.   Yes [provider]  aspirin 81 MG tablet Take 81 mg by mouth daily.   Yes [provider]  atorvastatin (LIPITOR) 80 MG tablet Take 1 tablet (80 mg total) by mouth at bedtime. 04/17/19  Yes Belva Crome, MD  benazepril (LOTENSIN) 10 MG tablet TAKE 1 TABLET BY MOUTH DAILY 02/26/20  Yes Belva Crome, MD  blood glucose meter kit and supplies Dispense based on patient and insurance preference. Use to check fasting blood glucose in the morning and to check glucose 2 hours after largest meal of the day. (FOR ICD-10 E10.9, E11.9). 01/13/17  Yes Opalski, Deborah, DO  FLUoxetine (PROZAC) 40 MG capsule TAKE 1 CAPSULE BY MOUTH DAILY 02/05/20  Yes Abonza, Maritza, PA-C  gabapentin (NEURONTIN) 800 MG tablet TAKE 1 TABLET BY MOUTH 3 TIMES DAILY 01/02/19  Yes Opalski, Deborah, DO  glucose blood test strip Use as instructed 01/13/17  Yes Opalski, Deborah, DO  Insulin Pen Needle (PEN NEEDLES) 31G X 8 MM MISC 1 each by Does not apply route daily. 07/24/19   Yes Lorrene Reid, PA-C  JARDIANCE 10 MG TABS tablet TAKE 1 TABLET BY MOUTH DAILY BEFORE BREAKFAST 09/05/19  Yes Belva Crome, MD  LANCETS ULTRA FINE MISC Use to check fasting blood glucose in the morning and to check glucose 2 hours after largest meal of the day 01/13/17  Yes Opalski, Deborah, DO  liraglutide (VICTOZA) 18 MG/3ML SOPN INJECT 1.2 MG ONCE DAILY. 03/21/20  Yes Abonza, Maritza, PA-C  metoprolol succinate (TOPROL-XL) 25 MG 24 hr tablet Take 1 tablet (25 mg total) by mouth daily. 03/13/19  Yes Belva Crome, MD  Multiple Vitamins-Iron (MULTIVITAMINS WITH IRON) TABS Take 1 tablet by mouth daily. 11/27/11  Yes Chatten, Carmen L, NP  pantoprazole (PROTONIX) 40 MG tablet TAKE 1 TABLET BY MOUTH DAILY 02/05/20  Yes Abonza, Maritza, PA-C  polyethylene glycol powder (GLYCOLAX/MIRALAX) powder USE AS DIRECTED 07/17/14  Yes Midge Minium, MD  pyridoxine (B-6) 100 MG tablet Take 100 mg by mouth daily.   Yes [provider]  traMADol (ULTRAM) 50 MG tablet Take 1-2 tablets (50-100 mg total) by mouth every 4 (four) hours as needed for moderate pain. 10/31/18  Yes Barrett, Erin R, PA-C  Vitamin D, Ergocalciferol, (DRISDOL) 1.25 MG (50000 UNIT) CAPS capsule TAKE 1 CAPSULE BY MOUTH EVERY 7 DAYS 01/09/20  Yes Lorrene Reid, PA-C    Allergies: Allergies  Allergen Reactions  . Prednisone Other (See Comments)    Caused blood sugar to increase, pt will not take    Social History:  reports that he quit smoking about 44 years ago. His smoking use included cigarettes. He has a 6.00 pack-year smoking history. He has quit using smokeless tobacco. He reports that he does not drink alcohol and does not use drugs.   Family History: Family History  Problem Relation Age of Onset  . Heart disease Father        pacer  . Urolithiasis Father   . Heart attack Father   . Aneurysm Mother 58       CNS aneurysm  . Sudden death Mother   . Diabetes Sister   . Cancer Maternal Uncle        ? primary  .  Cancer Paternal Uncle        ? primary  . Kidney disease Brother   . Heart disease Sister   . Heart disease Sister   . Hyperlipidemia Neg Hx   . Hypertension Neg Hx   . Colon cancer Neg Hx     Review of Systems: Review of Systems  Constitutional: Negative for chills and fever.  Respiratory: Negative for shortness of breath.   Cardiovascular: Negative for chest pain.  Gastrointestinal: Positive for abdominal pain. Negative for constipation, diarrhea, nausea and vomiting.  Genitourinary: Negative for dysuria.  Musculoskeletal: Positive for  back pain.  Skin: Negative for rash.  Neurological: Negative for dizziness.  Psychiatric/Behavioral: Negative for depression.    Physical Exam BP 120/82   Pulse 67   Temp 98.7 F (37.1 C) (Oral)   Ht 5' 11" (1.803 m)   Wt 236 lb 3.2 oz (107.1 kg)   SpO2 96%   BMI 32.94 kg/m  CONSTITUTIONAL: No acute distress HEENT:  Normocephalic, atraumatic, extraocular motion intact. NECK: Trachea is midline, and there is no jugular venous distension.  RESPIRATORY:  Lungs are clear, and breath sounds are equal bilaterally. Normal respiratory effort without pathologic use of accessory muscles. CARDIOVASCULAR: Heart is regular without murmurs, gallops, or rubs. GI: The abdomen is soft, non-distended, with only some mild discomfort to palpation in the epigastric and RUQ areas.  Negative Murphy's sign.  MUSCULOSKELETAL:  Normal muscle strength and tone in all four extremities.  No peripheral edema or cyanosis. SKIN: Skin turgor is normal. There are no pathologic skin lesions.  NEUROLOGIC:  Motor and sensation is grossly normal.  Cranial nerves are grossly intact. PSYCH:  Alert and oriented to person, place and time. Affect is normal.  Laboratory Analysis: --No recent labs  Imaging: U/S RUQ 03/21/20: IMPRESSION: Hepatic steatosis.  Small gallbladder wall polyp and sludge noted within the gallbladder.  Assessment and Plan: This is a 65 y.o. male  with biliary colic  --Discussed with the patient the ultrasound findings and discussed with him more detail about how the gallbladder works and its overall function.  Discussed with him that the sludge may be causing a blockage within the gallbladder or cystic duct that results in biliary colic.  He could potentially have gallstones that are very small and not as noticeable on ultrasound.  Nonetheless, given his persistent symptoms, I think it would be best to proceed with surgery.  --Discussed with him the robotic approach with robotic cholecystectomy at length, and reviewed with him the risks of bleeding, infection, injury to surrounding structures.  We'd be using ICG to better visualize the biliary anatomy as well.  He's willing to proceed.  He understands that he would need to get tested for COVID-19 prior to surgery, and that we'd send both medical and cardiac clearances.  He is on Aspirin currently. --We'll schedule him for 04/03/20 pending clearances.  His last dose of Aspirin would be on 03/28/20.  Face-to-face time spent with the patient and care providers was 60 minutes, with more than 50% of the time spent counseling, educating, and coordinating care of the patient.     Melvyn Neth, [facility]

## 2020-03-25 NOTE — Telephone Encounter (Signed)
Patient will need to be scheduled for surgery clearance in office. Please contact pt to schedule appropriately. AS, CMA

## 2020-03-25 NOTE — Progress Notes (Signed)
03/25/2020  Reason for Visit:  Biliary colic  Referring Provider:  Maritza Abonza, PA-C  History of Present Illness: [name] is a 65 y.o. male presenting for evaluation of symptomatic cholelithiasis.  The patient was seen by his PCP's office on 3/16 due to RUQ abdominal pain.  The pain was radiating towards his back.  Not associated with nausea or vomiting, but he does reports increase in burping during the episodes.  The pain does worsen with po intake, though he has not been eating anything particularly greasy or fatty.  Denies any fevers but does report having sweats during the episodes.  Denies any chest pain, shortness of breath, constipation or diarrhea.  He had an U/S on 3/17 which showed a 5 mm polyp in the gallbladder and sludge, without any gallbladder wall thickening or pericholecystic fluid.    Today, the patient reports that he continues to have some discomfort which spikes with po intake.  He's still able to eat though.    He does have a history of CABGx3 in 10/2018 and he saw his cardiologist about 3 months ago.  He was doing well and his next follow up was set in 12 months at the time.    Past Medical History: Past Medical History:  Diagnosis Date  . Carpal tunnel syndrome, bilateral   . Carpal tunnel syndrome, right    nerve impingement right arm/wears brace  . Coronary artery disease   . Diabetes mellitus    no meds  . Dyspnea   . GERD (gastroesophageal reflux disease)   . Gout   . Hepatic steatosis   . Hyperlipidemia   . Hypertension   . Obstructive sleep apnea      Past Surgical History: Past Surgical History:  Procedure Laterality Date  . CARDIAC CATHETERIZATION    . CARPAL TUNNEL RELEASE  09/10/2015   RIGHT WRIST / WITH NERVE IMPINGEMENT SURGERY  . CERVICAL FUSION     Dr Nudelman  . COLONOSCOPY  2007   negative; [facility]  . CORONARY ANGIOGRAPHY N/A 10/25/2018   Procedure: CORONARY ANGIOGRAPHY (CATH LAB);  Surgeon: Cooper, Michael, MD;   Location: [facility];  Service: Cardiovascular;  Laterality: N/A;  . CORONARY ARTERY BYPASS GRAFT N/A 10/26/2018   Procedure: CORONARY ARTERY BYPASS GRAFTING (CABG) x3 using the LIMA to LAD and right greater saphenous vein via endoscopic harvest to the Diag 1 and OM 2.;  Surgeon: Lightfoot, Harrell O, MD;  Location: [facility];  Service: Open Heart Surgery;  Laterality: N/A;  . ESOPHAGEAL DILATION  2007  . 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 LUMBAR FUSION      Dr Nudelman    Home Medications: Prior to Admission medications   Medication Sig Start Date End Date Taking? Authorizing Provider  acetaminophen (TYLENOL) 500 MG tablet Take 2 tablets (1,000 mg total) by mouth every 6 (six) hours as needed. 10/31/18  Yes Barrett, Erin R, PA-C  allopurinol (ZYLOPRIM) 300 MG tablet TAKE 1/2 TABLET BY MOUTH DAILY 01/16/20  Yes Abonza, Maritza, PA-C  Ascorbic Acid (VITAMIN C) 1000 MG tablet Take 1,000 mg by mouth daily.   Yes [provider]  aspirin 81 MG tablet Take 81 mg by mouth daily.   Yes [provider]  atorvastatin (LIPITOR) 80 MG tablet Take 1 tablet (80 mg total) by mouth at bedtime. 04/17/19  Yes Smith, Henry W, MD  benazepril (LOTENSIN) 10 MG tablet TAKE 1 TABLET BY MOUTH DAILY 02/26/20    Yes Belva Crome, MD  blood glucose meter kit and supplies Dispense based on patient and insurance preference. Use to check fasting blood glucose in the morning and to check glucose 2 hours after largest meal of the day. (FOR ICD-10 E10.9, E11.9). 01/13/17  Yes Opalski, Deborah, DO  FLUoxetine (PROZAC) 40 MG capsule TAKE 1 CAPSULE BY MOUTH DAILY 02/05/20  Yes Abonza, Maritza, PA-C  gabapentin (NEURONTIN) 800 MG tablet TAKE 1 TABLET BY MOUTH 3 TIMES DAILY 01/02/19  Yes Opalski, Deborah, DO  glucose blood test strip Use as instructed 01/13/17  Yes Opalski, Deborah, DO  Insulin Pen Needle (PEN NEEDLES) 31G X 8 MM MISC 1 each by Does not apply route daily. 07/24/19   Yes Lorrene Reid, PA-C  JARDIANCE 10 MG TABS tablet TAKE 1 TABLET BY MOUTH DAILY BEFORE BREAKFAST 09/05/19  Yes Belva Crome, MD  LANCETS ULTRA FINE MISC Use to check fasting blood glucose in the morning and to check glucose 2 hours after largest meal of the day 01/13/17  Yes Opalski, Deborah, DO  liraglutide (VICTOZA) 18 MG/3ML SOPN INJECT 1.2 MG ONCE DAILY. 03/21/20  Yes Abonza, Maritza, PA-C  metoprolol succinate (TOPROL-XL) 25 MG 24 hr tablet Take 1 tablet (25 mg total) by mouth daily. 03/13/19  Yes Belva Crome, MD  Multiple Vitamins-Iron (MULTIVITAMINS WITH IRON) TABS Take 1 tablet by mouth daily. 11/27/11  Yes Chatten, Carmen L, NP  pantoprazole (PROTONIX) 40 MG tablet TAKE 1 TABLET BY MOUTH DAILY 02/05/20  Yes Abonza, Maritza, PA-C  polyethylene glycol powder (GLYCOLAX/MIRALAX) powder USE AS DIRECTED 07/17/14  Yes Midge Minium, MD  pyridoxine (B-6) 100 MG tablet Take 100 mg by mouth daily.   Yes [provider]  traMADol (ULTRAM) 50 MG tablet Take 1-2 tablets (50-100 mg total) by mouth every 4 (four) hours as needed for moderate pain. 10/31/18  Yes Barrett, Erin R, PA-C  Vitamin D, Ergocalciferol, (DRISDOL) 1.25 MG (50000 UNIT) CAPS capsule TAKE 1 CAPSULE BY MOUTH EVERY 7 DAYS 01/09/20  Yes Lorrene Reid, PA-C    Allergies: Allergies  Allergen Reactions  . Prednisone Other (See Comments)    Caused blood sugar to increase, pt will not take    Social History:  reports that he quit smoking about 44 years ago. His smoking use included cigarettes. He has a 6.00 pack-year smoking history. He has quit using smokeless tobacco. He reports that he does not drink alcohol and does not use drugs.   Family History: Family History  Problem Relation Age of Onset  . Heart disease Father        pacer  . Urolithiasis Father   . Heart attack Father   . Aneurysm Mother 58       CNS aneurysm  . Sudden death Mother   . Diabetes Sister   . Cancer Maternal Uncle        ? primary  .  Cancer Paternal Uncle        ? primary  . Kidney disease Brother   . Heart disease Sister   . Heart disease Sister   . Hyperlipidemia Neg Hx   . Hypertension Neg Hx   . Colon cancer Neg Hx     Review of Systems: Review of Systems  Constitutional: Negative for chills and fever.  Respiratory: Negative for shortness of breath.   Cardiovascular: Negative for chest pain.  Gastrointestinal: Positive for abdominal pain. Negative for constipation, diarrhea, nausea and vomiting.  Genitourinary: Negative for dysuria.  Musculoskeletal: Positive for  back pain.  Skin: Negative for rash.  Neurological: Negative for dizziness.  Psychiatric/Behavioral: Negative for depression.    Physical Exam BP 120/82   Pulse 67   Temp 98.7 F (37.1 C) (Oral)   Ht 5' 11" (1.803 m)   Wt 236 lb 3.2 oz (107.1 kg)   SpO2 96%   BMI 32.94 kg/m  CONSTITUTIONAL: No acute distress HEENT:  Normocephalic, atraumatic, extraocular motion intact. NECK: Trachea is midline, and there is no jugular venous distension.  RESPIRATORY:  Lungs are clear, and breath sounds are equal bilaterally. Normal respiratory effort without pathologic use of accessory muscles. CARDIOVASCULAR: Heart is regular without murmurs, gallops, or rubs. GI: The abdomen is soft, non-distended, with only some mild discomfort to palpation in the epigastric and RUQ areas.  Negative Murphy's sign.  MUSCULOSKELETAL:  Normal muscle strength and tone in all four extremities.  No peripheral edema or cyanosis. SKIN: Skin turgor is normal. There are no pathologic skin lesions.  NEUROLOGIC:  Motor and sensation is grossly normal.  Cranial nerves are grossly intact. PSYCH:  Alert and oriented to person, place and time. Affect is normal.  Laboratory Analysis: --No recent labs  Imaging: U/S RUQ 03/21/20: IMPRESSION: Hepatic steatosis.  Small gallbladder wall polyp and sludge noted within the gallbladder.  Assessment and Plan: This is a 64 y.o. male  with biliary colic  --Discussed with the patient the ultrasound findings and discussed with him more detail about how the gallbladder works and its overall function.  Discussed with him that the sludge may be causing a blockage within the gallbladder or cystic duct that results in biliary colic.  He could potentially have gallstones that are very small and not as noticeable on ultrasound.  Nonetheless, given his persistent symptoms, I think it would be best to proceed with surgery.  --Discussed with him the robotic approach with robotic cholecystectomy at length, and reviewed with him the risks of bleeding, infection, injury to surrounding structures.  We'd be using ICG to better visualize the biliary anatomy as well.  He's willing to proceed.  He understands that he would need to get tested for COVID-19 prior to surgery, and that we'd send both medical and cardiac clearances.  He is on Aspirin currently. --We'll schedule him for 04/03/20 pending clearances.  His last dose of Aspirin would be on 03/28/20.  Face-to-face time spent with the patient and care providers was 60 minutes, with more than 50% of the time spent counseling, educating, and coordinating care of the patient.     Jose Luis Piscoya, MD [facility]   

## 2020-03-25 NOTE — Telephone Encounter (Signed)
Medical Clearance faxed to Lorrene Reid PA-C at this time [phone].   Cardiac Clearance faxed to Dr.Henry Tamala Julian [phone] at this time. Patient instructed to call providers to see if he needs to be seen prior to clearances being completed.

## 2020-03-25 NOTE — Telephone Encounter (Signed)
Patient is at the surgeons office and needs clearance for gallbladder surgery. Please advise, thanks. They will be faxing a form over.

## 2020-03-26 ENCOUNTER — Telehealth: Payer: Self-pay | Admitting: Surgery

## 2020-03-26 NOTE — Telephone Encounter (Signed)
2 attempt called pt no answer, or VM set up to leave a message.

## 2020-03-26 NOTE — Telephone Encounter (Signed)
   Primary Cardiologist: Sinclair Grooms, MD  Chart reviewed as part of pre-operative protocol coverage. Given past medical history and time since last visit, based on ACC/AHA guidelines, [name] would be at acceptable risk for the planned procedure without further cardiovascular testing.   He may hold his aspirin for 3-5 days prior to his procedure.  Please resume as soon as hemostasis is achieved at the discretion of the surgeon.  I will route this recommendation to the requesting party via Epic fax function and remove from pre-op pool.  Please call with questions.  Jossie Ng. Cleaver NP-C    03/26/2020, 4:00 PM [place] [address] Office [phone] Fax [phone]

## 2020-03-26 NOTE — Telephone Encounter (Signed)
Patient and wife have been advised of Pre-Admission date/time, COVID Testing date and Surgery date.  Surgery Date: 04/03/20 Preadmission Testing Date: 03/29/20 (phone 8a-1p) Covid Testing Date: 04/01/20 in person @ 10:20 am  - patient advised to go to the [facility] ([place])   Patient has been made aware to call [phone], between 1-3:00pm the day before surgery, to find out what time to arrive for surgery.

## 2020-03-26 NOTE — Telephone Encounter (Signed)
Okay to hold ASA 3-5 days prior.

## 2020-03-27 NOTE — Telephone Encounter (Signed)
Called and spoke to the pt he stated that the surgery is on 3/30 he stated that there were no question at this time he will call if he had any concerns.

## 2020-03-27 NOTE — Progress Notes (Signed)
Cardiology Clearance has been received from [facility]. The patient is cleared at acceptable risk for surgery. He may hold his aspirin for 3-5 days prior to surgery. All notes are in Epic.

## 2020-03-28 ENCOUNTER — Ambulatory Visit: Payer: Self-pay | Admitting: Surgery

## 2020-03-29 ENCOUNTER — Other Ambulatory Visit: Payer: Self-pay

## 2020-03-29 ENCOUNTER — Encounter
Admission: RE | Admit: 2020-03-29 | Discharge: 2020-03-29 | Disposition: A | Discharge status: Home / Self Care | Payer: Medicare Other | Source: Ambulatory Visit | Attending: Surgery | Admitting: Surgery

## 2020-03-29 NOTE — Patient Instructions (Addendum)
Your procedure is scheduled on: April 03, 2020 [facility] Report to the Registration Desk on the 1st floor of the [facility]. To find out your arrival time, please call [phone] between 1PM - 3PM on: April 02, 2020 TUESDAY  REMEMBER: Instructions that are not followed completely may result in serious medical risk, up to and including death; or upon the discretion of your surgeon and anesthesiologist your surgery may need to be rescheduled.  Do not eat food after midnight the night before surgery.  No gum chewing, lozengers or hard candies.  You may however, drink CLEAR liquids up to 2 hours before you are scheduled to arrive for your surgery. Do not drink anything within 2 hours of your scheduled arrival time.  Clear liquids include: - water   Type 1 and Type 2 diabetics should only drink water.  TAKE THESE MEDICATIONS THE MORNING OF SURGERY WITH A SIP OF WATER: ALLOPURINOL  ATORVASTATIN FLUOXENTINE METOPROLOL PANTOPRAZOLE (take one the night before and one on the morning of surgery - helps to prevent nausea after surgery.)  DO NOT TAKE BLOOD SUGAR THE DAY OF SURGERY AND DO NOT BENAZEPRIL DAY OF SURGERY  LAST DOSE  OF ASPIRIN 03/28/2020  One week prior to surgery: Stop Anti-inflammatories (NSAIDS) such as Advil, Aleve, Ibuprofen, Motrin, Naproxen, Naprosyn and Aspirin based products such as Excedrin, Goodys Powder, BC Powder.   USE TYLENOL IF NEEDED Stop ANY OVER THE COUNTER supplements until after surgery. STOP VIT C. CONTINUE VIT B, VIT D AND MULTIVITAMIN  No Alcohol for 24 hours before or after surgery.  No Smoking including e-cigarettes for 24 hours prior to surgery.  No chewable tobacco products for at least 6 hours prior to surgery.  No nicotine patches on the day of surgery.  Do not use any "recreational" drugs for at least a week prior to your surgery.  Please be advised that the combination of cocaine and anesthesia may have negative outcomes, up to and  including death. If you test positive for cocaine, your surgery will be cancelled.  On the morning of surgery brush your teeth with toothpaste and water, you may rinse your mouth with mouthwash if you wish. Do not swallow any toothpaste or mouthwash.  Do not wear jewelry, make-up, hairpins, clips or nail polish.  Do not wear lotions, powders, or perfumes AND DEODORANT    Do not shave body from the neck down 48 hours prior to surgery just in case you cut yourself which could leave a site for infection.  Also, freshly shaved skin may become irritated if using the CHG soap.  Contact lenses, hearing aids and dentures may not be worn into surgery.  Do not bring valuables to the hospital. [facility] is not responsible for any missing/lost belongings or valuables.   Use CHG Soap as directed on instruction sheet.  Bring your C-PAP to the hospital with you in case you may have to spend the night.   Notify your doctor if there is any change in your medical condition (cold, fever, infection).  Wear comfortable clothing (specific to your surgery type) to the hospital.  Plan for stool softeners for home use; pain medications have a tendency to cause constipation. You can also help prevent constipation by eating foods high in fiber such as fruits and vegetables and drinking plenty of fluids as your diet allows.  After surgery, you can help prevent lung complications by doing breathing exercises.  Take deep breaths and cough every 1-2 hours. Your doctor may  order a device called an Incentive Spirometer to help you take deep breaths. When coughing or sneezing, hold a pillow firmly against your incision with both hands. This is called "splinting." Doing this helps protect your incision. It also decreases belly discomfort.  If you are being discharged the day of surgery, you will not be allowed to drive home. You will need a responsible adult (18 years or older) to drive you home and stay with you  that night.   Please call the [place] Dept. at [phone] if you have any questions about these instructions.  Surgery Visitation Policy:  Patients undergoing a surgery or procedure may have one family member or support person with them as long as that person is not COVID-19 positive or experiencing its symptoms.  That person may remain in the waiting area during the procedure.  Inpatient Visitation:    Visiting hours are 7 a.m. to 8 p.m. Inpatients will be allowed two visitors daily. The visitors may change each day during the patient's stay. No visitors under the age of 11. Any visitor under the age of 50 must be accompanied by an adult. The visitor must pass COVID-19 screenings, use hand sanitizer when entering and exiting the patient's room and wear a mask at all times, including in the patient's room. Patients must also wear a mask when staff or their visitor are in the room. Masking is required regardless of vaccination status.

## 2020-03-30 NOTE — Progress Notes (Signed)
Perioperative Services  Pre-Admission/Anesthesia Testing Clinical Review  Date: 04/01/20  Patient Demographics:  Name: Oisin Yoakum DOB:   10/01/1955 MRN:   [id number]  Planned Surgical Procedure(s):    Case: [id number] Date/Time: 04/03/20 0715   Procedures:      XI ROBOTIC ASSISTED LAPAROSCOPIC CHOLECYSTECTOMY (N/A )     INDOCYANINE GREEN FLUORESCENCE IMAGING (ICG) (N/A )     HERNIA REPAIR UMBILICAL ADULT, open (N/A )   Anesthesia type: General   Pre-op diagnosis: biliary colic   Location: [facility] OR ROOM 06 / [place] ORS FOR ANESTHESIA GROUP   Surgeons: Olean Ree, MD    NOTE: Available PAT nursing documentation and vital signs have been reviewed. Clinical nursing staff has updated patient's PMH/PSHx, current medication list, and drug allergies/intolerances to ensure comprehensive history available to assist in medical decision making as it pertains to the aforementioned surgical procedure and anticipated anesthetic course.   Clinical Discussion:  Jahmire Ruffins is a 65 y.o. male who is submitted for pre-surgical anesthesia review and clearance prior to him undergoing the above procedure. Patient is a Former Smoker (6 pack years; quit 01/1976). Pertinent PMH includes: CAD (s/p CABG x 3), angina, HTN, HLD, T2DM, CKD, OSA, GERD (on daily PPI), anemia, ADHD (inattentive type), insomnia.  Patient is followed by cardiology Tamala Julian, MD). He was last seen in the cardiology clinic on 12/07/2019; notes reviewed.  At the time of his last clinic visit, patient doing well from a cardiovascular perspective.  He denied any chest pain, shortness of breath, PND, orthopnea, palpitations, peripheral edema, vertiginous symptoms, or presyncope/syncope.  PMH significant for cardiovascular disease.  Patient status post CABG x 3 in 2020 (LIMA to LAD, SVG to D1, and SVG OM2).  Pre-CABG TTE revealed a normal left ventricular systolic function with an EF of 55-60%.  Intraoperative TEE remained unchanged (see full  interpretation of cardiovascular testing below). Patient on GDMT for his HTN and HLD diagnoses.  Blood pressure well controlled at 120/80 on currently prescribed ACEi and beta-blocker therapies.  Patient is on a statin for his HLD. T2DM reasonably controlled on currently prescribed regimen; Hgb A1c 7.1% when last checked on 12/07/2019.  ECG in the office revealed sinus rhythm with nonspecific ST-T wave changes and a short PR interval.  Functional capacity, as defined by DASI, is documented as being >/= 4 METS.  No changes were made to patient's medication regimen.  Patient to follow-up with outpatient cardiology in 12 months or sooner if needed.  Patient is scheduled to undergo an elective cholecystectomy on 04/03/2020 with Dr. Ardath Sax.  Given patient's past medical history significant for cardiovascular disease and intervention, coupled with his comorbidities, presurgical internal medicine and cardiac clearances were sought by the performing surgeon's office and PAT team.  Specialty clearances were obtained as follows:   Per internal medicine ([place], PA-C), "reviewed abd [place] and gastroenterology consult. Reviewed recent labs obtained this morning. Renal function is stable. BP is well controlled,  A1c is stable for surgery, physical exam essentially unremarkable so will medically clear patient. Patient has received cardiac clearance from cardiologist. Recommend to follow preoperative instructions and hold aspirin as indicated by cardiology".   Per cardiology, "based on patient's past medical history and time since his last clinic visit, patient would be at an overall ACCEPTABLE risk for the planned procedure without further cardiovascular testing or intervention at this time".  This patient is on daily antiplatelet therapy.  He has been instructed on recommendations from cardiology for holding his daily low-dose ASA for  5 days prior to his procedure with plans to restart as soon as postoperative  bleeding risk felt to be minimized by the primary attending surgeon.  Patient is aware that his last dose of ASA will be on 03/28/2020.  Patient denies previous perioperative complications with anesthesia in the past. In review of the available records, it is noted that patient underwent a general anesthetic course at [facility] ([facility] in 10/2018 without documented complications.   Vitals with BMI 04/01/2020 03/29/2020 03/25/2020  Height _0  _1  _2   Weight 234 lbs 11 oz 235 lbs 236 lbs 3 oz  BMI 32.75 70.96 43.83  Systolic 818 - 403  Diastolic 70 - 82  Pulse 64 - 67    Providers/Specialists:   NOTE: Primary physician provider listed below. Patient may have been seen by APP or partner within same practice.   PROVIDER ROLE / SPECIALTY LAST Delight Stare, MD General Surgery  03/25/2020  Lorrene Reid, PA-C Primary Care Provider  04/01/2020  [name], MD Cardiology  12/07/2019   Allergies:  Prednisone  Current Home Medications:   No current facility-administered medications for this encounter.   Marland Kitchen acetaminophen (TYLENOL) 500 MG tablet  . allopurinol (ZYLOPRIM) 300 MG tablet  . Ascorbic Acid (VITAMIN C) 1000 MG tablet  . aspirin 81 MG tablet  . atorvastatin (LIPITOR) 80 MG tablet  . benazepril (LOTENSIN) 10 MG tablet  . FLUoxetine (PROZAC) 40 MG capsule  . gabapentin (NEURONTIN) 800 MG tablet  . JARDIANCE 10 MG TABS tablet  . liraglutide (VICTOZA) 18 MG/3ML SOPN  . metoprolol succinate (TOPROL-XL) 25 MG 24 hr tablet  . Multiple Vitamins-Iron (MULTIVITAMINS WITH IRON) TABS  . pantoprazole (PROTONIX) 40 MG tablet  . polyethylene glycol powder (GLYCOLAX/MIRALAX) powder  . pyridoxine (B-6) 100 MG tablet  . traMADol (ULTRAM) 50 MG tablet  . Vitamin D, Ergocalciferol, (DRISDOL) 1.25 MG (50000 UNIT) CAPS capsule  . blood glucose meter kit and supplies  . glucose blood test strip  . Insulin Pen Needle (PEN NEEDLES) 31G X 8 MM MISC  . LANCETS ULTRA FINE  MISC   History:   Past Medical History:  Diagnosis Date  . ADHD   . Anemia   . BPH (benign prostatic hyperplasia)   . Carpal tunnel syndrome, bilateral   . Carpal tunnel syndrome, right    nerve impingement right arm/wears brace  . Chronic back pain   . CKD (chronic kidney disease)   . Coronary artery disease   . Diabetes mellitus   . Dyspnea   . GERD (gastroesophageal reflux disease)   . Gout   . Hepatic steatosis   . History of angina   . HLD (hyperlipidemia)   . 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 Hyperlipidemia   . Hypertension   . Insomnia   . Obstructive sleep apnea   . OSA (obstructive sleep apnea)    Past Surgical History:  Procedure Laterality Date  . CARDIAC CATHETERIZATION    . CARPAL TUNNEL RELEASE  09/10/2015   RIGHT WRIST / WITH NERVE IMPINGEMENT SURGERY  . CERVICAL FUSION     Dr Sherwood Gambler  . COLONOSCOPY  2007   negative;  GI  . CORONARY ANGIOGRAPHY N/A 10/25/2018   Procedure: CORONARY ANGIOGRAPHY (CATH LAB);  Surgeon: Sherren Mocha, MD;  Location: [place] CV LAB;  Service: Cardiovascular;  Laterality: N/A;  . CORONARY ARTERY BYPASS GRAFT N/A 10/26/2018  Procedure: CORONARY ARTERY BYPASS GRAFTING (CABG) x3 using the LIMA to LAD and right greater saphenous vein via endoscopic harvest to the Diag 1 and OM 2.;  Surgeon: Lajuana Matte, MD;  Location: [facility];  Service: Open Heart Surgery;  Laterality: N/A;  . ESOPHAGEAL DILATION  2007  . 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 LUMBAR FUSION      Dr Sherwood Gambler   Family History  Problem Relation Age of Onset  . Heart disease Father        pacer  . Urolithiasis Father   . Heart attack Father   . Aneurysm Mother 54       CNS aneurysm  . Sudden death Mother   . Diabetes Sister   . Cancer Maternal Uncle        ? primary  . Cancer Paternal Uncle        ? primary  . Kidney disease Brother   . Heart disease Sister   . Heart  disease Sister   . Hyperlipidemia Neg Hx   . Hypertension Neg Hx   . Colon cancer Neg Hx    Social History   Tobacco Use  . Smoking status: Former Smoker    Packs/day: 1.50    Years: 4.00    Pack years: 6.00    Types: Cigarettes    Quit date: 01/06/1976    Years since quitting: 44.2  . Smokeless tobacco: Never Used  . Tobacco comment: smoked 35 years ago as of 2013   Vaping Use  . Vaping Use: Never used  Substance Use Topics  . Alcohol use: No  . Drug use: No    Pertinent Clinical Results:  LABS: Labs reviewed: Acceptable for surgery.  Office Visit on 04/01/2020  Component Date Value Ref Range Status  . Hemoglobin A1C 04/01/2020 7.2* 4.0 - 5.6 % Final  Hospital Outpatient Visit on 04/01/2020  Component Date Value Ref Range Status  . WBC 04/01/2020 4.9  4.0 - 10.5 K/uL Final  . RBC 04/01/2020 5.16  4.22 - 5.81 MIL/uL Final  . Hemoglobin 04/01/2020 13.9  13.0 - 17.0 g/dL Final  . HCT 04/01/2020 42.7  39.0 - 52.0 % Final  . MCV 04/01/2020 82.8  80.0 - 100.0 fL Final  . MCH 04/01/2020 26.9  26.0 - 34.0 pg Final  . MCHC 04/01/2020 32.6  30.0 - 36.0 g/dL Final  . RDW 04/01/2020 13.3  11.5 - 15.5 % Final  . Platelets 04/01/2020 227  150 - 400 K/uL Final  . nRBC 04/01/2020 0.0  0.0 - 0.2 % Final   Performed at [facility], [address], [place], [place] [address]  . Sodium 04/01/2020 139  135 - 145 mmol/L Final  . Potassium 04/01/2020 4.8  3.5 - 5.1 mmol/L Final  . Chloride 04/01/2020 108  98 - 111 mmol/L Final  . CO2 04/01/2020 24  22 - 32 mmol/L Final  . Glucose, Bld 04/01/2020 114* 70 - 99 mg/dL Final   Glucose reference range applies only to samples taken after fasting for at least 8 hours.  . BUN 04/01/2020 28* 8 - 23 mg/dL Final  . Creatinine, Ser 04/01/2020 1.78* 0.61 - 1.24 mg/dL Final  . Calcium 04/01/2020 9.3  8.9 - 10.3 mg/dL Final  . GFR, Estimated 04/01/2020 42* >60 mL/min Final   Comment: (NOTE) Calculated using the CKD-EPI Creatinine Equation  (2021)   . Anion gap 04/01/2020 7  5 - 15 Final  Performed at [facility], [address] [place], [place] [address]    ECG: Date: 12/07/2019 Time ECG obtained: 0947 AM Rate: 61 bpm Rhythm: normal sinus Axis (leads I and aVF): Normal Intervals: PR 152 ms. QRS 88 ms. QTc 380 ms. ST segment and T wave changes: Nonspecific ST and T wave abnormality; no acute elevation or depression  Comparison: Similar to previous tracing obtained on 11/15/2018   IMAGING / PROCEDURES: TRANSESOPHAGEAL ECHOCARDIOGRAM performed on 10/26/2018 1. Left ventricle remains unchanged from pre-bypass  Low normal left ventricular systolic function with an EF of 50 to 55%  Mild concentric LVH 2. The aorta appears unchanged from pre-bypass 3. Left atrial appendage appears unchanged from pre-bypass.   4. Aortic, mitral, and tricuspid valves all appear unchanged from pre-bypass. 5. Pericardium appears unchanged from pre-bypass  CORONARY ARTERY BYPASS GRAFTING procedure performed on 10/26/2018 1. Severe complex distal LM disease extending into the LAD and LCx ostia 2. CABG x 3 with intraoperative TEE performed  LIMA to LAD  SVG to DM 1  SVG to OM 2   LEFT HEART CATHETERIZATION AND CORONARY ANGIOGRAPHY performed on 10/25/2018 1. Severe complex distal LM disease extending into the LAD and LCx ostia  Mid LM to distal LM lesion is 95% stenosed 2. Severe proximal LAD stenosis  Proximal LAD lesion is 70% stenosed 3. Patent nondominant RCA without high-grade stenosis 4. Urgent surgical consultation recommended    ECHOCARDIOGRAM performed on 10/25/2018 1. Left ventricular ejection fraction, by visual estimation, is 55 to 60%.  2. The left ventricle has normal function.  3. Normal left ventricular size.  4. There is no left ventricular hypertrophy.  5. Global right ventricle has normal systolic function. 6. The right ventricular size is mildly enlarged.  7. No increase in right ventricular  wall thickness.  8. Left atrial size was mildly dilated.  9. Right atrial size was normal.  10. Mild to moderate mitral annular calcification. The mitral valve is normal in structure. Trace mitral valve regurgitation. No evidence of mitral stenosis.  11. The tricuspid valve is normal in structure. Tricuspid valve regurgitation is trivial.  12. The aortic valve is tricuspid Aortic valve regurgitation was not visualized by color flow Doppler. Mild aortic valve sclerosis without stenosis.  13. The pulmonic valve was normal in structure. Pulmonic valve regurgitation is not visualized by color flow Doppler.  14. TR signal is inadequate for assessing pulmonary artery systolic pressure.   Impression and Plan:  Jonovan Boedecker has been referred for pre-anesthesia review and clearance prior to him undergoing the planned anesthetic and procedural courses. Available labs, pertinent testing, and imaging results were personally reviewed by me. This patient has been appropriately cleared by cardiology and by internal medicine with an overall ACCEPTABLE risk for significant perioperative complications.  Based on clinical review performed today (04/01/20), barring any significant acute changes in the patient's overall condition, it is anticipated that he will be able to proceed with the planned surgical intervention. Any acute changes in clinical condition may necessitate his procedure being postponed and/or cancelled. Pre-surgical instructions were reviewed with the patient during his PAT appointment and questions were fielded by PAT clinical staff.  Honor Loh, MSN, APRN, FNP-C, CEN [facility]  Peri-operative Services Nurse Practitioner Phone: [phone] 04/01/20 1:44 PM  NOTE: This note has been prepared using Dragon dictation software. Despite my best ability to proofread, there is always the potential that unintentional transcriptional errors may still occur from this process.

## 2020-04-01 ENCOUNTER — Other Ambulatory Visit: Payer: Medicare Other

## 2020-04-01 ENCOUNTER — Encounter
Admission: RE | Admit: 2020-04-01 | Discharge: 2020-04-01 | Disposition: A | Discharge status: Home / Self Care | Payer: Medicare Other | Source: Ambulatory Visit | Attending: Surgery | Admitting: Surgery

## 2020-04-01 ENCOUNTER — Other Ambulatory Visit: Payer: Self-pay | Admitting: Physician Assistant

## 2020-04-01 ENCOUNTER — Encounter: Payer: Self-pay | Admitting: Surgery

## 2020-04-01 ENCOUNTER — Other Ambulatory Visit: Payer: Self-pay

## 2020-04-01 ENCOUNTER — Encounter: Payer: Self-pay | Admitting: Physician Assistant

## 2020-04-01 ENCOUNTER — Ambulatory Visit (INDEPENDENT_AMBULATORY_CARE_PROVIDER_SITE_OTHER): Payer: Medicare Other | Admitting: Physician Assistant

## 2020-04-01 VITALS — BP 116/70 | HR 64 | Temp 96.4°F | Ht 71.0 in | Wt 234.7 lb

## 2020-04-01 DIAGNOSIS — Z01818 Encounter for other preprocedural examination: Secondary | ICD-10-CM

## 2020-04-01 DIAGNOSIS — E1129 Type 2 diabetes mellitus with other diabetic kidney complication: Secondary | ICD-10-CM | POA: Diagnosis not present

## 2020-04-01 DIAGNOSIS — E1169 Type 2 diabetes mellitus with other specified complication: Secondary | ICD-10-CM

## 2020-04-01 DIAGNOSIS — E559 Vitamin D deficiency, unspecified: Secondary | ICD-10-CM

## 2020-04-01 DIAGNOSIS — E785 Hyperlipidemia, unspecified: Secondary | ICD-10-CM

## 2020-04-01 DIAGNOSIS — Z951 Presence of aortocoronary bypass graft: Secondary | ICD-10-CM

## 2020-04-01 DIAGNOSIS — E1159 Type 2 diabetes mellitus with other circulatory complications: Secondary | ICD-10-CM

## 2020-04-01 DIAGNOSIS — Z01812 Encounter for preprocedural laboratory examination: Secondary | ICD-10-CM | POA: Insufficient documentation

## 2020-04-01 DIAGNOSIS — I152 Hypertension secondary to endocrine disorders: Secondary | ICD-10-CM

## 2020-04-01 DIAGNOSIS — N183 Chronic kidney disease, stage 3 unspecified: Secondary | ICD-10-CM

## 2020-04-01 DIAGNOSIS — Z20822 Contact with and (suspected) exposure to covid-19: Secondary | ICD-10-CM | POA: Insufficient documentation

## 2020-04-01 LAB — CBC
HCT: 42.7 % (ref 39.0–52.0)
Hemoglobin: 13.9 g/dL (ref 13.0–17.0)
MCH: 26.9 pg (ref 26.0–34.0)
MCHC: 32.6 g/dL (ref 30.0–36.0)
MCV: 82.8 fL (ref 80.0–100.0)
Platelets: 227 10*3/uL (ref 150–400)
RBC: 5.16 MIL/uL (ref 4.22–5.81)
RDW: 13.3 % (ref 11.5–15.5)
WBC: 4.9 10*3/uL (ref 4.0–10.5)
nRBC: 0 % (ref 0.0–0.2)

## 2020-04-01 LAB — BASIC METABOLIC PANEL
Anion gap: 7 (ref 5–15)
BUN: 28 mg/dL — ABNORMAL HIGH (ref 8–23)
CO2: 24 mmol/L (ref 22–32)
Calcium: 9.3 mg/dL (ref 8.9–10.3)
Chloride: 108 mmol/L (ref 98–111)
Creatinine, Ser: 1.78 mg/dL — ABNORMAL HIGH (ref 0.61–1.24)
GFR, Estimated: 42 mL/min — ABNORMAL LOW (ref >60)
Glucose, Bld: 114 mg/dL — ABNORMAL HIGH (ref 70–99)
Potassium: 4.8 mmol/L (ref 3.5–5.1)
Sodium: 139 mmol/L (ref 135–145)

## 2020-04-01 LAB — POCT GLYCOSYLATED HEMOGLOBIN (HGB A1C): Hemoglobin A1C: 7.2 % — AB (ref 4.0–5.6)

## 2020-04-01 LAB — SARS CORONAVIRUS 2 (TAT 6-24 HRS): SARS Coronavirus 2: NEGATIVE

## 2020-04-01 NOTE — Progress Notes (Signed)
Established Patient Office Visit  Subjective:  Patient ID: [name], male    DOB: 09-27-1955  Age: 65 y.o. MRN: [id number]  CC:  Chief Complaint  Patient presents with  . Follow-up    Surgery Clearance    HPI [name] presents for preoperative clearance for laparoscopic cholecystectomy.  Patient has a past medical history of type 2 diabetes mellitus, hypertension, hyperlipidemia, CKD, CAD, GERD and DDD. Patient is followed by Cardiology, Dr. Tamala Julian and has been cleared for surgery. Patient denies prior history of surgical complications. Denies chest pain, palpitations, shortness of breath, dizziness, polydipsia, polyuria, or lower extremity edema. Has no new acute concerns today.  Past Medical History:  Diagnosis Date  . ADHD   . Anemia   . BPH (benign prostatic hyperplasia)   . Carpal tunnel syndrome, bilateral   . Carpal tunnel syndrome, right    nerve impingement right arm/wears brace  . Chronic back pain   . CKD (chronic kidney disease)   . Coronary artery disease   . Diabetes mellitus   . Dyspnea   . GERD (gastroesophageal reflux disease)   . Gout   . Hepatic steatosis   . History of angina   . HLD (hyperlipidemia)   . 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 Hyperlipidemia   . Hypertension   . Insomnia   . Obstructive sleep apnea   . OSA (obstructive sleep apnea)     Past Surgical History:  Procedure Laterality Date  . CARDIAC CATHETERIZATION    . CARPAL TUNNEL RELEASE  09/10/2015   RIGHT WRIST / WITH NERVE IMPINGEMENT SURGERY  . CERVICAL FUSION     Dr Sherwood Gambler  . COLONOSCOPY  2007   negative; [facility]  . CORONARY ANGIOGRAPHY N/A 10/25/2018   Procedure: CORONARY ANGIOGRAPHY (CATH LAB);  Surgeon: Sherren Mocha, MD;  Location: [name];  Service: Cardiovascular;  Laterality: N/A;  . CORONARY ARTERY BYPASS GRAFT N/A 10/26/2018   Procedure: CORONARY ARTERY BYPASS GRAFTING (CABG) x3 using the LIMA to LAD and right  greater saphenous vein via endoscopic harvest to the Diag 1 and OM 2.;  Surgeon: Lajuana Matte, MD;  Location: [facility];  Service: Open Heart Surgery;  Laterality: N/A;  . ESOPHAGEAL DILATION  2007  . 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 LUMBAR FUSION      Dr Sherwood Gambler    Family History  Problem Relation Age of Onset  . Heart disease Father        pacer  . Urolithiasis Father   . Heart attack Father   . Aneurysm Mother 45       CNS aneurysm  . Sudden death Mother   . Diabetes Sister   . Cancer Maternal Uncle        ? primary  . Cancer Paternal Uncle        ? primary  . Kidney disease Brother   . Heart disease Sister   . Heart disease Sister   . Hyperlipidemia Neg Hx   . Hypertension Neg Hx   . Colon cancer Neg Hx     Social History   Socioeconomic History  . Marital status: Married    Spouse name: Not on file  . Number of children: Not on file  . Years of education: Not on file  . Highest education level: Not on file  Occupational History  .  Occupation: Disabled  Tobacco Use  . Smoking status: Former Smoker    Packs/day: 1.50    Years: 4.00    Pack years: 6.00    Types: Cigarettes    Quit date: 01/06/1976    Years since quitting: 44.2  . Smokeless tobacco: Never Used  . Tobacco comment: smoked 35 years ago as of 2013   Vaping Use  . Vaping Use: Never used  Substance and Sexual Activity  . Alcohol use: No  . Drug use: No  . Sexual activity: Yes    Birth control/protection: None  Other Topics Concern  . Not on file  Social History Narrative  . Not on file   Social Determinants of Health   Financial Resource Strain: Not on file  Food Insecurity: Not on file  Transportation Needs: Not on file  Physical Activity: Not on file  Stress: Not on file  Social Connections: Not on file  Intimate Partner Violence: Not on file    Outpatient Medications Prior to Visit  Medication Sig Dispense Refill  . acetaminophen  (TYLENOL) 500 MG tablet Take 2 tablets (1,000 mg total) by mouth every 6 (six) hours as needed. (Patient taking differently: Take 1,000 mg by mouth every 6 (six) hours as needed for moderate pain.) 30 tablet 0  . allopurinol (ZYLOPRIM) 300 MG tablet TAKE 1/2 TABLET BY MOUTH DAILY (Patient taking differently: Take 150 mg by mouth daily.) 45 tablet 0  . Ascorbic Acid (VITAMIN C) 1000 MG tablet Take 1,000 mg by mouth daily.    Marland Kitchen aspirin 81 MG tablet Take 81 mg by mouth daily.    Marland Kitchen atorvastatin (LIPITOR) 80 MG tablet Take 1 tablet (80 mg total) by mouth at bedtime. 90 tablet 3  . benazepril (LOTENSIN) 10 MG tablet TAKE 1 TABLET BY MOUTH DAILY (Patient taking differently: Take 10 mg by mouth daily.) 90 tablet 3  . blood glucose meter kit and supplies Dispense based on patient and insurance preference. Use to check fasting blood glucose in the morning and to check glucose 2 hours after largest meal of the day. (FOR ICD-10 E10.9, E11.9). 1 each 0  . FLUoxetine (PROZAC) 40 MG capsule TAKE 1 CAPSULE BY MOUTH DAILY (Patient taking differently: Take 40 mg by mouth daily.) 60 capsule 0  . gabapentin (NEURONTIN) 800 MG tablet TAKE 1 TABLET BY MOUTH 3 TIMES DAILY (Patient taking differently: Take 800 mg by mouth 3 (three) times daily.) 270 tablet 1  . glucose blood test strip Use as instructed 100 each 12  . Insulin Pen Needle (PEN NEEDLES) 31G X 8 MM MISC 1 each by Does not apply route daily. 90 each 0  . JARDIANCE 10 MG TABS tablet TAKE 1 TABLET BY MOUTH DAILY BEFORE BREAKFAST (Patient taking differently: Take 10 mg by mouth daily.) 30 tablet 7  . LANCETS ULTRA FINE MISC Use to check fasting blood glucose in the morning and to check glucose 2 hours after largest meal of the day 100 each 12  . liraglutide (VICTOZA) 18 MG/3ML SOPN INJECT 1.2 MG ONCE DAILY. (Patient taking differently: Inject 1.2 mg into the skin every morning. INJECT 1.2 MG ONCE DAILY.) 18 mL 0  . metoprolol succinate (TOPROL-XL) 25 MG 24 hr tablet  Take 1 tablet (25 mg total) by mouth daily. 90 tablet 3  . Multiple Vitamins-Iron (MULTIVITAMINS WITH IRON) TABS Take 1 tablet by mouth daily. 30 tablet 0  . pantoprazole (PROTONIX) 40 MG tablet TAKE 1 TABLET BY MOUTH DAILY (Patient taking differently: Take  40 mg by mouth daily.) 60 tablet 0  . polyethylene glycol powder (GLYCOLAX/MIRALAX) powder USE AS DIRECTED (Patient taking differently: Take 17 g by mouth daily as needed for moderate constipation. USE AS DIRECTED) 850 g 11  . pyridoxine (B-6) 100 MG tablet Take 100 mg by mouth daily.    . traMADol (ULTRAM) 50 MG tablet Take 1-2 tablets (50-100 mg total) by mouth every 4 (four) hours as needed for moderate pain. 30 tablet 0  . Vitamin D, Ergocalciferol, (DRISDOL) 1.25 MG (50000 UNIT) CAPS capsule TAKE 1 CAPSULE BY MOUTH EVERY 7 DAYS (Patient taking differently: Take 50,000 Units by mouth every Saturday.) 12 capsule 0   No facility-administered medications prior to visit.    Allergies  Allergen Reactions  . Prednisone Other (See Comments)    Caused blood sugar to increase, pt will not take    ROS Review of Systems A fourteen system review of systems was performed and found to be positive as per HPI.   Objective:    Physical Exam Constitutional:      General: He is not in acute distress.    Appearance: He is not toxic-appearing.  HENT:     Head: Normocephalic and atraumatic.     Right Ear: Tympanic membrane and ear canal normal. There is no impacted cerumen.     Left Ear: Tympanic membrane and ear canal normal. There is no impacted cerumen.     Nose: Nose normal.     Mouth/Throat:     Pharynx: Oropharynx is clear. No oropharyngeal exudate or posterior oropharyngeal erythema.  Eyes:     Extraocular Movements: Extraocular movements intact.     Conjunctiva/sclera: Conjunctivae normal.     Pupils: Pupils are equal, round, and reactive to light.  Cardiovascular:     Rate and Rhythm: Normal rate and regular rhythm.     Pulses:  Normal pulses.     Heart sounds: Normal heart sounds.  Pulmonary:     Effort: Pulmonary effort is normal. No respiratory distress.     Breath sounds: Normal breath sounds. No wheezing, rhonchi or rales.  Abdominal:     General: Bowel sounds are normal. There is no distension.     Tenderness: There is abdominal tenderness. There is no right CVA tenderness or left CVA tenderness.  Musculoskeletal:        General: Normal range of motion.     Cervical back: Normal range of motion and neck supple.  Lymphadenopathy:     Cervical: No cervical adenopathy.  Skin:    General: Skin is warm and dry.  Neurological:     General: No focal deficit present.     Mental Status: He is alert.  Psychiatric:        Mood and Affect: Mood normal.        Behavior: Behavior normal.        Thought Content: Thought content normal.        Judgment: Judgment normal.      BP 116/70   Pulse 64   Temp (!) 96.4 F (35.8 C)   Ht _0  (1.803 m)   Wt 234 lb 11.2 oz (106.5 kg)   SpO2 97%   BMI 32.73 kg/m  Wt Readings from Last 3 Encounters:  04/01/20 234 lb 11.2 oz (106.5 kg)  03/29/20 235 lb (106.6 kg)  03/25/20 236 lb 3.2 oz (107.1 kg)     Health Maintenance Due  Topic Date Due  . Hepatitis C Screening  Never done  .  TETANUS/TDAP  08/03/2018  . OPHTHALMOLOGY EXAM  10/06/2018    There are no preventive care reminders to display for this patient.  Lab Results  Component Value Date   TSH 3.370 02/09/2018   Lab Results  Component Value Date   WBC 4.9 04/01/2020   HGB 13.9 04/01/2020   HCT 42.7 04/01/2020   MCV 82.8 04/01/2020   PLT 227 04/01/2020   Lab Results  Component Value Date   NA 139 04/01/2020   K 4.8 04/01/2020   CO2 24 04/01/2020   GLUCOSE 114 (H) 04/01/2020   BUN 28 (H) 04/01/2020   CREATININE 1.78 (H) 04/01/2020   BILITOT 0.4 12/05/2019   ALKPHOS 125 (H) 12/05/2019   AST 32 12/05/2019   ALT 34 12/05/2019   PROT 6.9 12/05/2019   ALBUMIN 4.3 12/05/2019   CALCIUM 9.3  04/01/2020   ANIONGAP 7 04/01/2020   GFR 58.84 (L) 04/17/2016   Lab Results  Component Value Date   CHOL 149 12/05/2019   Lab Results  Component Value Date   HDL 39 (L) 12/05/2019   Lab Results  Component Value Date   LDLCALC 80 12/05/2019   Lab Results  Component Value Date   TRIG 174 (H) 12/05/2019   Lab Results  Component Value Date   CHOLHDL 3.8 12/05/2019   Lab Results  Component Value Date   HGBA1C 7.1 (A) 12/07/2019      Assessment & Plan:   Problem List Items Addressed This Visit      Cardiovascular and Mediastinum   Hypertension associated with diabetes ([place]) (Chronic)     Endocrine   Hyperlipidemia associated with type 2 diabetes mellitus (HCC) (Chronic)   Type 2 diabetes mellitus with renal complication (HCC)     Genitourinary   Chronic kidney disease (CKD), stage III (moderate)    Other Visit Diagnoses    Preoperative clearance    -  Primary     Preoperative clearance: -Reviewed abd [place] and gastroenterology consult.  -Reviewed recent labs obtained this morning. Renal function is stable. BP is well controlled,  A1c is stable for surgery, physical exam essentially unremarkable so will medically clear patient. Patient has received cardiac clearance from Cardiologist. -Recommend to follow preoperative instructions and hold aspirin as indicated by Cardiology.  Type 2 diabetes mellitus with renal complication: -Q1F today 7.2, stable. Last UA microalbumin stable. -Continue current medication regimen. -Will continue to monitor. Follow up as scheduled.  Hypertension associated with diabetes: -Controlled. -Continue current medication regimen, -Will continue to monitor alongside cardiology.  Hyperlipidemia associated with type 2 diabetes mellitus: -Last lipid panel stable, LDL 80 (goal <70). -Continue current medication regimen. -Will continue to monitor alongside cardiology.  S/p CABG: -Followed by Cardiology. Asymptomatic.  CKD: -Recent BMP:  Cr 1.78, GFR 42, stable -Will continue to monitor.   No orders of the defined types were placed in this encounter.   Follow-up: No follow-ups on file.    Lorrene Reid, PA-C

## 2020-04-02 MED ORDER — CEFAZOLIN SODIUM-DEXTROSE 2-4 GM/100ML-% IV SOLN
2.0000 g | INTRAVENOUS | Status: AC
  Administered 2020-04-03: 2 g via INTRAVENOUS

## 2020-04-02 MED ORDER — CHLORHEXIDINE GLUCONATE CLOTH 2 % EX PADS: 6.0000 | MEDICATED_PAD | Freq: Once | CUTANEOUS | Status: DC

## 2020-04-02 MED ORDER — INDOCYANINE GREEN 25 MG IV SOLR
2.5000 mg | INTRAVENOUS | Status: AC
  Administered 2020-04-03: 2.5 mg via INTRAVENOUS
  Filled 2020-04-02: qty 10

## 2020-04-02 MED ORDER — ACETAMINOPHEN 500 MG PO TABS: 1000.0000 mg | ORAL_TABLET | ORAL | Status: AC

## 2020-04-02 MED ORDER — SODIUM CHLORIDE 0.9 % IV SOLN: INTRAVENOUS | Status: DC

## 2020-04-02 MED ORDER — GABAPENTIN 100 MG PO CAPS: 200.0000 mg | ORAL_CAPSULE | ORAL | Status: AC

## 2020-04-03 ENCOUNTER — Encounter
Admission: RE | Disposition: A | Discharge status: Home / Self Care | Payer: Self-pay | Source: Ambulatory Visit | Attending: Surgery

## 2020-04-03 ENCOUNTER — Other Ambulatory Visit: Payer: Self-pay

## 2020-04-03 ENCOUNTER — Ambulatory Visit
Admission: RE | Admit: 2020-04-03 | Discharge: 2020-04-03 | Disposition: A | Discharge status: Home / Self Care | Payer: Medicare Other | Source: Ambulatory Visit | Attending: Surgery | Admitting: Surgery

## 2020-04-03 ENCOUNTER — Ambulatory Visit: Payer: Medicare Other | Admitting: Urgent Care

## 2020-04-03 ENCOUNTER — Encounter: Payer: Self-pay | Admitting: Surgery

## 2020-04-03 DIAGNOSIS — Z7984 Long term (current) use of oral hypoglycemic drugs: Secondary | ICD-10-CM | POA: Diagnosis not present

## 2020-04-03 DIAGNOSIS — K8050 Calculus of bile duct without cholangitis or cholecystitis without obstruction: Secondary | ICD-10-CM

## 2020-04-03 DIAGNOSIS — Z794 Long term (current) use of insulin: Secondary | ICD-10-CM | POA: Diagnosis not present

## 2020-04-03 DIAGNOSIS — K429 Umbilical hernia without obstruction or gangrene: Secondary | ICD-10-CM | POA: Diagnosis not present

## 2020-04-03 DIAGNOSIS — Z888 Allergy status to other drugs, medicaments and biological substances status: Secondary | ICD-10-CM | POA: Diagnosis not present

## 2020-04-03 DIAGNOSIS — Z79899 Other long term (current) drug therapy: Secondary | ICD-10-CM | POA: Insufficient documentation

## 2020-04-03 DIAGNOSIS — K8044 Calculus of bile duct with chronic cholecystitis without obstruction: Secondary | ICD-10-CM | POA: Diagnosis not present

## 2020-04-03 DIAGNOSIS — K8064 Calculus of gallbladder and bile duct with chronic cholecystitis without obstruction: Secondary | ICD-10-CM | POA: Insufficient documentation

## 2020-04-03 DIAGNOSIS — E559 Vitamin D deficiency, unspecified: Secondary | ICD-10-CM | POA: Diagnosis not present

## 2020-04-03 DIAGNOSIS — Z7982 Long term (current) use of aspirin: Secondary | ICD-10-CM | POA: Insufficient documentation

## 2020-04-03 DIAGNOSIS — K219 Gastro-esophageal reflux disease without esophagitis: Secondary | ICD-10-CM | POA: Diagnosis not present

## 2020-04-03 DIAGNOSIS — Z87891 Personal history of nicotine dependence: Secondary | ICD-10-CM | POA: Diagnosis not present

## 2020-04-03 DIAGNOSIS — G4733 Obstructive sleep apnea (adult) (pediatric): Secondary | ICD-10-CM | POA: Diagnosis not present

## 2020-04-03 HISTORY — DX: Attention-deficit hyperactivity disorder, unspecified type: F90.9

## 2020-04-03 HISTORY — DX: Personal history of other diseases of the circulatory system: Z86.79

## 2020-04-03 HISTORY — DX: Anemia, unspecified: D64.9

## 2020-04-03 HISTORY — DX: Obstructive sleep apnea (adult) (pediatric): G47.33

## 2020-04-03 HISTORY — DX: Benign prostatic hyperplasia without lower urinary tract symptoms: N40.0

## 2020-04-03 HISTORY — PX: UMBILICAL HERNIA REPAIR: SHX196

## 2020-04-03 HISTORY — DX: Hyperlipidemia, unspecified: E78.5

## 2020-04-03 HISTORY — DX: Insomnia, unspecified: G47.00

## 2020-04-03 HISTORY — DX: Chronic kidney disease, unspecified: N18.9

## 2020-04-03 HISTORY — DX: Other chronic pain: G89.29

## 2020-04-03 LAB — GLUCOSE, CAPILLARY
Glucose-Capillary: 108 mg/dL — ABNORMAL HIGH (ref 70–99)
Glucose-Capillary: 139 mg/dL — ABNORMAL HIGH (ref 70–99)

## 2020-04-03 SURGERY — CHOLECYSTECTOMY, ROBOT-ASSISTED, LAPAROSCOPIC
Anesthesia: General | Site: Abdomen

## 2020-04-03 MED ORDER — LIDOCAINE HCL (CARDIAC) PF 100 MG/5ML IV SOSY
PREFILLED_SYRINGE | INTRAVENOUS | Status: DC | PRN
  Administered 2020-04-03: 60 mg via INTRAVENOUS

## 2020-04-03 MED ORDER — OXYCODONE HCL 5 MG PO TABS
5.0000 mg | ORAL_TABLET | Freq: Once | ORAL | Status: AC
Start: 2020-04-03 — End: 2020-04-03

## 2020-04-03 MED ORDER — FENTANYL CITRATE (PF) 100 MCG/2ML IJ SOLN
INTRAMUSCULAR | Status: DC | PRN
  Administered 2020-04-03: 100 ug via INTRAVENOUS

## 2020-04-03 MED ORDER — OXYCODONE HCL 5 MG PO TABS: 5.0000 mg | ORAL_TABLET | ORAL | 0 refills | Status: DC | PRN

## 2020-04-03 MED ORDER — CHLORHEXIDINE GLUCONATE 0.12 % MT SOLN
OROMUCOSAL | Status: AC
  Administered 2020-04-03: 15 mL via OROMUCOSAL
  Filled 2020-04-03: qty 15

## 2020-04-03 MED ORDER — SUGAMMADEX SODIUM 200 MG/2ML IV SOLN
INTRAVENOUS | Status: DC | PRN
  Administered 2020-04-03: 200 mg via INTRAVENOUS

## 2020-04-03 MED ORDER — ONDANSETRON HCL 4 MG/2ML IJ SOLN
INTRAMUSCULAR | Status: DC | PRN
  Administered 2020-04-03: 4 mg via INTRAVENOUS

## 2020-04-03 MED ORDER — BUPIVACAINE LIPOSOME 1.3 % IJ SUSP
INTRAMUSCULAR | Status: AC
  Filled 2020-04-03: qty 20

## 2020-04-03 MED ORDER — KETOROLAC TROMETHAMINE 30 MG/ML IJ SOLN
INTRAMUSCULAR | Status: DC | PRN
  Administered 2020-04-03: 30 mg via INTRAVENOUS

## 2020-04-03 MED ORDER — FENTANYL CITRATE (PF) 100 MCG/2ML IJ SOLN
INTRAMUSCULAR | Status: AC
  Filled 2020-04-03: qty 2

## 2020-04-03 MED ORDER — FENTANYL CITRATE (PF) 100 MCG/2ML IJ SOLN
25.0000 ug | INTRAMUSCULAR | Status: DC | PRN
  Administered 2020-04-03 (×3): 25 ug via INTRAVENOUS

## 2020-04-03 MED ORDER — CEFAZOLIN SODIUM-DEXTROSE 2-4 GM/100ML-% IV SOLN
INTRAVENOUS | Status: AC
  Filled 2020-04-03: qty 100

## 2020-04-03 MED ORDER — LACTATED RINGERS IV SOLN: INTRAVENOUS | Status: DC | PRN

## 2020-04-03 MED ORDER — BUPIVACAINE-EPINEPHRINE (PF) 0.5% -1:200000 IJ SOLN
INTRAMUSCULAR | Status: AC
  Filled 2020-04-03: qty 30

## 2020-04-03 MED ORDER — KETOROLAC TROMETHAMINE 30 MG/ML IJ SOLN
INTRAMUSCULAR | Status: AC
  Filled 2020-04-03: qty 1

## 2020-04-03 MED ORDER — GABAPENTIN 100 MG PO CAPS
ORAL_CAPSULE | ORAL | Status: AC
  Administered 2020-04-03: 200 mg via ORAL
  Filled 2020-04-03: qty 2

## 2020-04-03 MED ORDER — BUPIVACAINE-EPINEPHRINE (PF) 0.5% -1:200000 IJ SOLN
INTRAMUSCULAR | Status: DC | PRN
  Administered 2020-04-03: 30 mL

## 2020-04-03 MED ORDER — MIDAZOLAM HCL 2 MG/2ML IJ SOLN
INTRAMUSCULAR | Status: AC
  Filled 2020-04-03: qty 2

## 2020-04-03 MED ORDER — ORAL CARE MOUTH RINSE: 15.0000 mL | Freq: Once | OROMUCOSAL | Status: AC

## 2020-04-03 MED ORDER — PHENYLEPHRINE HCL (PRESSORS) 10 MG/ML IV SOLN
INTRAVENOUS | Status: DC | PRN
  Administered 2020-04-03: 200 ug via INTRAVENOUS
  Administered 2020-04-03: 100 ug via INTRAVENOUS
  Administered 2020-04-03: 200 ug via INTRAVENOUS

## 2020-04-03 MED ORDER — OXYCODONE HCL 5 MG PO TABS
ORAL_TABLET | ORAL | Status: AC
  Administered 2020-04-03: 5 mg via ORAL
  Filled 2020-04-03: qty 1

## 2020-04-03 MED ORDER — MIDAZOLAM HCL 2 MG/2ML IJ SOLN
INTRAMUSCULAR | Status: DC | PRN
  Administered 2020-04-03: 2 mg via INTRAVENOUS

## 2020-04-03 MED ORDER — PROPOFOL 10 MG/ML IV BOLUS
INTRAVENOUS | Status: AC
  Filled 2020-04-03: qty 20

## 2020-04-03 MED ORDER — ACETAMINOPHEN 500 MG PO TABS: 1000.0000 mg | ORAL_TABLET | Freq: Four times a day (QID) | ORAL | Status: DC | PRN

## 2020-04-03 MED ORDER — ROCURONIUM BROMIDE 100 MG/10ML IV SOLN
INTRAVENOUS | Status: DC | PRN
  Administered 2020-04-03: 20 mg via INTRAVENOUS
  Administered 2020-04-03: 60 mg via INTRAVENOUS

## 2020-04-03 MED ORDER — PROPOFOL 10 MG/ML IV BOLUS
INTRAVENOUS | Status: DC | PRN
  Administered 2020-04-03: 170 mg via INTRAVENOUS
  Administered 2020-04-03: 30 mg via INTRAVENOUS

## 2020-04-03 MED ORDER — ROCURONIUM BROMIDE 10 MG/ML (PF) SYRINGE
PREFILLED_SYRINGE | INTRAVENOUS | Status: AC
  Filled 2020-04-03: qty 10

## 2020-04-03 MED ORDER — CHLORHEXIDINE GLUCONATE 0.12 % MT SOLN: 15.0000 mL | Freq: Once | OROMUCOSAL | Status: AC

## 2020-04-03 MED ORDER — ONDANSETRON HCL 4 MG/2ML IJ SOLN: 4.0000 mg | Freq: Once | INTRAMUSCULAR | Status: DC | PRN

## 2020-04-03 MED ORDER — ACETAMINOPHEN 500 MG PO TABS
ORAL_TABLET | ORAL | Status: AC
  Administered 2020-04-03: 1000 mg via ORAL
  Filled 2020-04-03: qty 2

## 2020-04-03 SURGICAL SUPPLY — 72 items
"PENCIL ELECTRO HAND CTR " (MISCELLANEOUS) ×2 IMPLANT
ADH SKN CLS APL DERMABOND .7 (GAUZE/BANDAGES/DRESSINGS) ×2
APL PRP STRL LF DISP 70% ISPRP (MISCELLANEOUS) ×2
BAG INFUSER PRESSURE 100CC (MISCELLANEOUS) IMPLANT
BAG SPEC RTRVL LRG 6X4 10 (ENDOMECHANICALS) ×2
BLADE SURG 15 STRL LF DISP TIS (BLADE) ×2 IMPLANT
BLADE SURG 15 STRL SS (BLADE) ×3
CANISTER SUCT 1200ML W/VALVE (MISCELLANEOUS) ×2 IMPLANT
CANNULA REDUC XI 12-8 STAPL (CANNULA) ×3
CANNULA REDUCER 12-8 DVNC XI (CANNULA) ×2 IMPLANT
CHLORAPREP W/TINT 26 (MISCELLANEOUS) ×3 IMPLANT
CLIP VESOLOCK MED LG 6/CT (CLIP) ×3 IMPLANT
COVER TIP SHEARS 8 DVNC (MISCELLANEOUS) ×2 IMPLANT
COVER TIP SHEARS 8MM DA VINCI (MISCELLANEOUS)
COVER WAND RF STERILE (DRAPES) ×2 IMPLANT
CUP MEDICINE 2OZ PLAST GRAD ST (MISCELLANEOUS) ×3 IMPLANT
DECANTER SPIKE VIAL GLASS SM (MISCELLANEOUS) ×2 IMPLANT
DEFOGGER SCOPE WARMER CLEARIFY (MISCELLANEOUS) ×3 IMPLANT
DERMABOND ADVANCED (GAUZE/BANDAGES/DRESSINGS) ×1
DERMABOND ADVANCED .7 DNX12 (GAUZE/BANDAGES/DRESSINGS) ×2 IMPLANT
DRAPE ARM DVNC X/XI (DISPOSABLE) ×8 IMPLANT
DRAPE COLUMN DVNC XI (DISPOSABLE) ×2 IMPLANT
DRAPE DA VINCI XI ARM (DISPOSABLE) ×12
DRAPE DA VINCI XI COLUMN (DISPOSABLE) ×3
DRAPE LAPAROTOMY 77X122 PED (DRAPES) ×3 IMPLANT
ELECT CAUTERY BLADE TIP 2.5 (TIP) ×3
ELECT REM PT RETURN 9FT ADLT (ELECTROSURGICAL) ×3
ELECTRODE CAUTERY BLDE TIP 2.5 (TIP) ×2 IMPLANT
ELECTRODE REM PT RTRN 9FT ADLT (ELECTROSURGICAL) ×2 IMPLANT
GLOVE SURG SYN 7.0 (GLOVE) ×6 IMPLANT
GLOVE SURG SYN 7.0 PF PI (GLOVE) ×4 IMPLANT
GLOVE SURG SYN 7.5  E (GLOVE) ×6
GLOVE SURG SYN 7.5 E (GLOVE) ×4 IMPLANT
GLOVE SURG SYN 7.5 PF PI (GLOVE) ×4 IMPLANT
GOWN STRL REUS W/ TWL LRG LVL3 (GOWN DISPOSABLE) ×8 IMPLANT
GOWN STRL REUS W/TWL LRG LVL3 (GOWN DISPOSABLE) ×12
IRRIGATOR SUCT 8 DISP DVNC XI (IRRIGATION / IRRIGATOR) IMPLANT
IRRIGATOR SUCTION 8MM XI DISP (IRRIGATION / IRRIGATOR)
IV NS 1000ML (IV SOLUTION)
IV NS 1000ML BAXH (IV SOLUTION) IMPLANT
KIT PINK PAD W/HEAD ARE REST (MISCELLANEOUS) ×3
KIT PINK PAD W/HEAD ARM REST (MISCELLANEOUS) ×2 IMPLANT
LABEL OR SOLS (LABEL) ×3 IMPLANT
MANIFOLD NEPTUNE II (INSTRUMENTS) ×3 IMPLANT
NEEDLE HYPO 22GX1.5 SAFETY (NEEDLE) ×3 IMPLANT
NS IRRIG 500ML POUR BTL (IV SOLUTION) ×3 IMPLANT
OBTURATOR OPTICAL STANDARD 8MM (TROCAR) ×3
OBTURATOR OPTICAL STND 8 DVNC (TROCAR) ×2
OBTURATOR OPTICALSTD 8 DVNC (TROCAR) ×2 IMPLANT
PACK BASIN MINOR ARMC (MISCELLANEOUS) ×3 IMPLANT
PACK LAP CHOLECYSTECTOMY (MISCELLANEOUS) ×3 IMPLANT
PENCIL ELECTRO HAND CTR (MISCELLANEOUS) ×3 IMPLANT
POUCH SPECIMEN RETRIEVAL 10MM (ENDOMECHANICALS) ×3 IMPLANT
SEAL CANN UNIV 5-8 DVNC XI (MISCELLANEOUS) ×8 IMPLANT
SEAL XI 5MM-8MM UNIVERSAL (MISCELLANEOUS) ×12
SET TUBE SMOKE EVAC HIGH FLOW (TUBING) ×3 IMPLANT
SOLUTION ELECTROLUBE (MISCELLANEOUS) ×3 IMPLANT
SPONGE LAP 18X18 RF (DISPOSABLE) ×1 IMPLANT
SPONGE LAP 4X18 RFD (DISPOSABLE) ×3 IMPLANT
STAPLER CANNULA SEAL DVNC XI (STAPLE) ×2 IMPLANT
STAPLER CANNULA SEAL XI (STAPLE) ×3
SUT ETHIBOND 0 MO6 C/R (SUTURE) ×2 IMPLANT
SUT MNCRL AB 4-0 PS2 18 (SUTURE) ×3 IMPLANT
SUT VIC AB 2-0 SH 27 (SUTURE) ×6
SUT VIC AB 2-0 SH 27XBRD (SUTURE) ×2 IMPLANT
SUT VIC AB 3-0 SH 27 (SUTURE) ×3
SUT VIC AB 3-0 SH 27X BRD (SUTURE) ×2 IMPLANT
SUT VICRYL 0 AB UR-6 (SUTURE) ×6 IMPLANT
SYR 20ML LL LF (SYRINGE) ×3 IMPLANT
SYR BULB IRRIG 60ML STRL (SYRINGE) ×3 IMPLANT
TAPE TRANSPORE STRL 2 31045 (GAUZE/BANDAGES/DRESSINGS) ×3 IMPLANT
TROCAR BALLN GELPORT 12X130M (ENDOMECHANICALS) ×3 IMPLANT

## 2020-04-03 NOTE — Transfer of Care (Signed)
Immediate Anesthesia Transfer of Care Note  Patient: [name]  Procedure(s) Performed: XI ROBOTIC ASSISTED LAPAROSCOPIC CHOLECYSTECTOMY (N/A Abdomen) INDOCYANINE GREEN FLUORESCENCE IMAGING (ICG) (N/A ) HERNIA REPAIR UMBILICAL ADULT, open (N/A Abdomen)  Patient Location: PACU  Anesthesia Type:General  Level of Consciousness: awake, alert  and oriented  Airway & Oxygen Therapy: Patient Spontanous Breathing and Patient connected to face mask oxygen  Post-op Assessment: Report given to RN and Post -op Vital signs reviewed and stable  Post vital signs: Reviewed and stable  Last Vitals:  Vitals Value Taken Time  BP 144/78 04/03/20 0932  Temp    Pulse    Resp 18 04/03/20 0940  SpO2    Vitals shown include unvalidated device data.  Last Pain:  Vitals:   04/03/20 0628  TempSrc: Temporal  PainSc: 0-No pain         Complications: No complications documented.

## 2020-04-03 NOTE — Op Note (Signed)
  Procedure Date:  04/03/2020  Pre-operative Diagnosis:  Biliary colic, umbilical hernia  Post-operative Diagnosis:  Biliary colic, umbilical hernia  Procedure:  Robotic assisted cholecystectomy with ICG FireFly cholangiogram; open umbilical hernia repair  Surgeon:  Melvyn Neth, MD  Anesthesia:  General endotracheal  Estimated Blood Loss:  5 ml  Specimens:  gallbladder  Complications:  None  Indications for Procedure:  This is a 65 y.o. male who presents with abdominal pain and workup revealing biliary colic and on exam, also an umbilical hernia.  The benefits, complications, treatment options, and expected outcomes were discussed with the patient. The risks of bleeding, infection, recurrence of symptoms, failure to resolve symptoms, bile duct damage, bile duct leak, retained common bile duct stone, bowel injury, and need for further procedures were all discussed with the patient and he was willing to proceed.  Description of Procedure: The patient was correctly identified in the preoperative area and brought into the operating room.  The patient was placed supine with VTE prophylaxis in place.  Appropriate time-outs were performed.  Anesthesia was induced and the patient was intubated.  Appropriate antibiotics were infused.  The abdomen was prepped and draped in a sterile fashion. An infraumbilical incision was made. Cautery was used to dissect down the umbilical stalk.  The stalk was separated from the underlying fascia, revealing a small 1 cm umbilical hernia defect.  The hernia sac was resected.  The fascial edges were cleared using cautery and a 12 mm robotic port was inserted.  Pneumoperitoneum was obtained with appropriate opening pressures.  Three 8-mm ports were placed in the mid abdomen at the level of the umbilicus under direct visualization.  The DaVinci platform was docked, camera targeted, and instruments were placed under direct visualization.  The gallbladder was  identified.  The fundus was grasped and retracted cephalad.  Adhesions were lysed bluntly and with electrocautery. The infundibulum was grasped and retracted laterally, exposing the peritoneum overlying the gallbladder.  This was incised with electrocautery and extended on either side of the gallbladder.  FireFly cholangiogram was then obtained, and we were able to clearly identify the cystic duct and common bile duct.  The cystic duct and cystic artery were carefully dissected with combination of cautery and blunt dissection.  Both were clipped twice proximally and once distally, cutting in between.  The gallbladder was taken from the gallbladder fossa in a retrograde fashion with electrocautery. The gallbladder was placed in an Endocatch bag. The liver bed was inspected and any bleeding was controlled with electrocautery. The right upper quadrant was then inspected again revealing intact clips, no bleeding, and no ductal injury.  The 8 mm ports were removed under direct visualization and the 12 mm port was removed.  The Endocatch bag was brought out via the umbilical incision. The hernia defect was repaired using 0 vicryl sutures.  Local anesthetic was infused in all incisions.  The umbilical stalk was reattached to the fascia using 2-0 Vicryl.  The umbilical incision was closed in layers using 3-0 Vicryl and 4-0 Monocryl, and the remaining incisions were closed with 4-0 Monocryl.  The wounds were cleaned and sealed with DermaBond.  The patient was emerged from anesthesia and extubated and brought to the recovery room for further management.  The patient tolerated the procedure well and all counts were correct at the end of the case.   Melvyn Neth, MD

## 2020-04-03 NOTE — Interval H&P Note (Signed)
History and Physical Interval Note:  04/03/2020 7:06 AM  [name]  has presented today for surgery, with the diagnosis of biliary colic.  The various methods of treatment have been discussed with the patient and family. After consideration of risks, benefits and other options for treatment, the patient has consented to  Procedure(s): XI ROBOTIC ASSISTED LAPAROSCOPIC CHOLECYSTECTOMY (N/A) [place] (ICG) (N/A) HERNIA REPAIR UMBILICAL ADULT, open (N/A) as a surgical intervention.  The patient's history has been reviewed, patient examined, no change in status, stable for surgery.  I have reviewed the patient's chart and labs.  Questions were answered to the patient's satisfaction.     [name]

## 2020-04-03 NOTE — Anesthesia Postprocedure Evaluation (Signed)
Anesthesia Post Note  Patient: [name]  Procedure(s) Performed: XI ROBOTIC ASSISTED LAPAROSCOPIC CHOLECYSTECTOMY (N/A Abdomen) INDOCYANINE GREEN FLUORESCENCE IMAGING (ICG) (N/A ) HERNIA REPAIR UMBILICAL ADULT, open (N/A Abdomen)  Patient location during evaluation: PACU Anesthesia Type: General Level of consciousness: awake and alert Pain management: pain level controlled Vital Signs Assessment: post-procedure vital signs reviewed and stable Respiratory status: spontaneous breathing, nonlabored ventilation, respiratory function stable and patient connected to nasal cannula oxygen Cardiovascular status: blood pressure returned to baseline and stable Postop Assessment: no apparent nausea or vomiting Anesthetic complications: no   No complications documented.   Last Vitals:  Vitals:   04/03/20 1030 04/03/20 1042  BP: 119/82 (!) 141/78  Pulse: 88 69  Resp: 16 12  Temp: (!) 36.3 C (!) 36.3 C  SpO2: 93% 94%    Last Pain:  Vitals:   04/03/20 1042  TempSrc: Temporal  PainSc: [place] Tationna Fullard

## 2020-04-03 NOTE — Anesthesia Preprocedure Evaluation (Signed)
Anesthesia Evaluation  Patient identified by MRN, date of birth, ID band Patient awake    Reviewed: Allergy & Precautions, H&P , NPO status , Patient's Chart, lab work & pertinent test results, reviewed documented beta blocker date and time   Airway Mallampati: III  TM Distance: >3 FB Neck ROM: full    Dental  (+) Teeth Intact   Pulmonary shortness of breath and with exertion, sleep apnea and Continuous Positive Airway Pressure Ventilation , former smoker,    Pulmonary exam normal        Cardiovascular Exercise Tolerance: Good hypertension, On Medications + angina with exertion + CAD  Normal cardiovascular exam Rhythm:regular Rate:Normal     Neuro/Psych PSYCHIATRIC DISORDERS  Neuromuscular disease    GI/Hepatic Neg liver ROS, GERD  Medicated,  Endo/Other  diabetesMorbid obesity  Renal/GU Renal disease  negative genitourinary   Musculoskeletal   Abdominal   Peds  Hematology  (+) Blood dyscrasia, anemia ,   Anesthesia Other Findings Past Medical History: No date: ADHD No date: Anemia No date: BPH (benign prostatic hyperplasia) No date: Carpal tunnel syndrome, bilateral No date: Carpal tunnel syndrome, right     Comment:  nerve impingement right arm/wears brace No date: Chronic back pain No date: CKD (chronic kidney disease) No date: Coronary artery disease No date: Diabetes mellitus No date: Dyspnea No date: GERD (gastroesophageal reflux disease) No date: Gout No date: Hepatic steatosis No date: History of angina No date: HLD (hyperlipidemia) 10/2018: Hx of CABG     Comment:  LIMA to LAD, SVG to D1 and SVG to OM2   No date: Hyperlipidemia No date: Hypertension No date: Insomnia No date: Obstructive sleep apnea No date: OSA (obstructive sleep apnea) Past Surgical History: No date: CARDIAC CATHETERIZATION 09/10/2015: CARPAL TUNNEL RELEASE     Comment:  RIGHT WRIST / WITH NERVE IMPINGEMENT SURGERY No  date: CERVICAL FUSION     Comment:  Dr Sherwood Gambler 2007: COLONOSCOPY     Comment:  negative; [facility] 10/25/2018: CORONARY ANGIOGRAPHY; N/A     Comment:  Procedure: CORONARY ANGIOGRAPHY (CATH LAB);  Surgeon:               Sherren Mocha, MD;  Location: [facility];                Service: Cardiovascular;  Laterality: N/A; 10/26/2018: CORONARY ARTERY BYPASS GRAFT; N/A     Comment:  Procedure: CORONARY ARTERY BYPASS GRAFTING (CABG) x3               using the LIMA to LAD and right greater saphenous vein               via endoscopic harvest to the Diag 1 and OM 2.;  Surgeon:              Lajuana Matte, MD;  Location: [facility];  Service:               Open Heart Surgery;  Laterality: N/A; 2007: ESOPHAGEAL DILATION 2003: FINGER AMPUTATION     Comment:  Left Index finger amputation & reattachment No date: LUMBAR FUSION     Comment:   Dr Sherwood Gambler BMI    Body Mass Index: 32.75 kg/m     Reproductive/Obstetrics negative OB ROS                             Anesthesia Physical Anesthesia Plan  ASA: III  Anesthesia Plan: General  ETT   Post-op Pain Management:    Induction:   PONV Risk Score and Plan: 3  Airway Management Planned:   Additional Equipment:   Intra-op Plan:   Post-operative Plan:   Informed Consent: I have reviewed the patients History and Physical, chart, labs and discussed the procedure including the risks, benefits and alternatives for the proposed anesthesia with the patient or authorized representative who has indicated his/her understanding and acceptance.     Dental Advisory Given  Plan Discussed with: CRNA  Anesthesia Plan Comments:         Anesthesia Quick Evaluation

## 2020-04-03 NOTE — Anesthesia Procedure Notes (Signed)
Procedure Name: Intubation Date/Time: 04/03/2020 7:47 AM Performed by: Babs Sciara, CRNA Pre-anesthesia Checklist: Patient identified, Emergency Drugs available, Suction available, Patient being monitored and Timeout performed Patient Re-evaluated:Patient Re-evaluated prior to induction Oxygen Delivery Method: Circle system utilized Preoxygenation: Pre-oxygenation with 100% oxygen Induction Type: IV induction Ventilation: Mask ventilation without difficulty and Oral airway inserted - appropriate to patient size Laryngoscope Size: McGraph and 4 Grade View: Grade I Tube type: Oral Tube size: 7.5 mm Number of attempts: 1 (chip left upper front tooth noted prior to laryngoscopy) Airway Equipment and Method: Stylet and Video-laryngoscopy Placement Confirmation: positive ETCO2 and breath sounds checked- equal and bilateral Secured at: 23 (lip) cm Tube secured with: Tape Dental Injury: Teeth and Oropharynx as per pre-operative assessment

## 2020-04-03 NOTE — Discharge Instructions (Signed)

## 2020-04-04 ENCOUNTER — Encounter: Payer: Self-pay | Admitting: Surgery

## 2020-04-04 LAB — SURGICAL PATHOLOGY

## 2020-04-08 ENCOUNTER — Other Ambulatory Visit: Payer: Self-pay | Admitting: Interventional Cardiology

## 2020-04-10 ENCOUNTER — Ambulatory Visit (INDEPENDENT_AMBULATORY_CARE_PROVIDER_SITE_OTHER): Payer: Medicare Other | Admitting: Podiatry

## 2020-04-10 ENCOUNTER — Other Ambulatory Visit: Payer: Self-pay

## 2020-04-10 ENCOUNTER — Encounter: Payer: Self-pay | Admitting: Podiatry

## 2020-04-10 DIAGNOSIS — L84 Corns and callosities: Secondary | ICD-10-CM

## 2020-04-10 DIAGNOSIS — E119 Type 2 diabetes mellitus without complications: Secondary | ICD-10-CM

## 2020-04-10 DIAGNOSIS — M2041 Other hammer toe(s) (acquired), right foot: Secondary | ICD-10-CM | POA: Diagnosis not present

## 2020-04-10 DIAGNOSIS — M2010 Hallux valgus (acquired), unspecified foot: Secondary | ICD-10-CM

## 2020-04-10 DIAGNOSIS — N183 Chronic kidney disease, stage 3 unspecified: Secondary | ICD-10-CM

## 2020-04-10 NOTE — Progress Notes (Signed)
This patient returns to my office for at risk foot care.  This patient requires this care by a professional since this patient will be at risk due to having diabetes and CKD.  This patient is unable to cut painful corn second toe  himself since the patient cannot reach his feet..This   Callus is   painful walking and wearing shoes.  Patient has severe bunion deformities with second toes hammered.  This patient presents for at risk foot care today.  General Appearance  Alert, conversant and in no acute stress.  Vascular  Dorsalis pedis and posterior tibial  pulses are palpable  bilaterally.  Capillary return is within normal limits  bilaterally. Temperature is within normal limits  bilaterally.  Neurologic  Senn-Weinstein monofilament wire test within normal limits  bilaterally. Muscle power within normal limits bilaterally.  Nails Thick disfigured discolored nails with subungual debris  from hallux to fifth toes bilaterally. No evidence of bacterial infection or drainage bilaterally.  Orthopedic  No limitations of motion  feet .  No crepitus or effusions noted.  No bony pathology or digital deformities noted.  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  Skin  normotropic skin with no porokeratosis noted bilaterally.  No signs of infections or ulcers noted.  Corn on the medial aspect second toe right foot.  Corn second toe right foot.  Consent was obtained for treatment procedures.   Debridement of corn with # 15 blade second toe right foot.    Filed with dremel without incident. Padding dispensed.  Told patient he needs surgery to correct this problem.   Return office visit     prn                Told patient to return for periodic foot care and evaluation due to potential at risk complications.   Gardiner Barefoot DPM

## 2020-04-15 ENCOUNTER — Other Ambulatory Visit: Payer: Self-pay | Admitting: Physician Assistant

## 2020-04-15 DIAGNOSIS — I152 Hypertension secondary to endocrine disorders: Secondary | ICD-10-CM

## 2020-04-15 DIAGNOSIS — Z0000 Encounter for general adult medical examination without abnormal findings: Secondary | ICD-10-CM

## 2020-04-15 DIAGNOSIS — E1159 Type 2 diabetes mellitus with other circulatory complications: Secondary | ICD-10-CM

## 2020-04-15 DIAGNOSIS — E559 Vitamin D deficiency, unspecified: Secondary | ICD-10-CM

## 2020-04-15 DIAGNOSIS — E1129 Type 2 diabetes mellitus with other diabetic kidney complication: Secondary | ICD-10-CM

## 2020-04-15 DIAGNOSIS — E785 Hyperlipidemia, unspecified: Secondary | ICD-10-CM

## 2020-04-15 DIAGNOSIS — E1169 Type 2 diabetes mellitus with other specified complication: Secondary | ICD-10-CM

## 2020-04-17 ENCOUNTER — Other Ambulatory Visit: Payer: Self-pay

## 2020-04-17 ENCOUNTER — Ambulatory Visit (INDEPENDENT_AMBULATORY_CARE_PROVIDER_SITE_OTHER): Payer: Medicare Other | Admitting: Surgery

## 2020-04-17 ENCOUNTER — Encounter: Payer: Self-pay | Admitting: Surgery

## 2020-04-17 VITALS — BP 102/74 | HR 69 | Temp 99.1°F | Ht 71.0 in | Wt 235.2 lb

## 2020-04-17 DIAGNOSIS — Z09 Encounter for follow-up examination after completed treatment for conditions other than malignant neoplasm: Secondary | ICD-10-CM

## 2020-04-17 DIAGNOSIS — K8050 Calculus of bile duct without cholangitis or cholecystitis without obstruction: Secondary | ICD-10-CM

## 2020-04-17 NOTE — Progress Notes (Signed)
04/17/2020  HPI: [name] is a 65 y.o. male s/p robotic assisted cholecystectomy on 04/03/20.  He presents today for follow up.  He reports that he's been doing well, recovering slowly.  He's tolerating a diet, with better bowel function, and still having some abdominal discomfort at the incisions.  Denies any nausea or vomiting.    Vital signs: BP 102/74   Pulse 69   Temp 99.1 F (37.3 C) (Oral)   Ht 5\' 11"  (1.803 m)   Wt 235 lb 3.2 oz (106.7 kg)   SpO2 98%   BMI 32.80 kg/m    Physical Exam: Constitutional:  No acute distress Abdomen:  Soft, non-distended, with appropriate soreness to palpation.  Umbilical incision has a scab in the middle of the wound, with what appears to be some minimal underlying skin dehiscence.  No drainage or evidence of infection.  The other incisions are clean, dry, intact.  Assessment/Plan: This is a 65 y.o. male s/p robotic assisted cholecystectomy.  --Discussed with the patient that he's recovering well, as expected after surgery.  The scab over his umbilical incision will fall off on its own, and the underlying skin will finish sealing up.  For now avoid pool/tub until fully sealed.   --Has two more weeks of no heavy lifting or pushing --Follow up as needed.   Melvyn Neth, [facility]

## 2020-04-17 NOTE — Patient Instructions (Signed)

## 2020-04-18 ENCOUNTER — Encounter: Payer: Medicare Other | Admitting: Physician Assistant

## 2020-04-23 ENCOUNTER — Other Ambulatory Visit: Payer: Medicare Other

## 2020-04-23 ENCOUNTER — Other Ambulatory Visit: Payer: Self-pay

## 2020-04-23 DIAGNOSIS — Z0000 Encounter for general adult medical examination without abnormal findings: Secondary | ICD-10-CM | POA: Diagnosis not present

## 2020-04-23 DIAGNOSIS — E1169 Type 2 diabetes mellitus with other specified complication: Secondary | ICD-10-CM | POA: Diagnosis not present

## 2020-04-23 DIAGNOSIS — E559 Vitamin D deficiency, unspecified: Secondary | ICD-10-CM | POA: Diagnosis not present

## 2020-04-23 DIAGNOSIS — E785 Hyperlipidemia, unspecified: Secondary | ICD-10-CM

## 2020-04-23 DIAGNOSIS — E1129 Type 2 diabetes mellitus with other diabetic kidney complication: Secondary | ICD-10-CM

## 2020-04-23 DIAGNOSIS — E1159 Type 2 diabetes mellitus with other circulatory complications: Secondary | ICD-10-CM

## 2020-04-24 LAB — LIPID PANEL
Chol/HDL Ratio: 3.4 ratio (ref 0.0–5.0)
Cholesterol, Total: 145 mg/dL (ref 100–199)
HDL: 43 mg/dL (ref >39)
LDL Chol Calc (NIH): 78 mg/dL (ref 0–99)
Triglycerides: 137 mg/dL (ref 0–149)
VLDL Cholesterol Cal: 24 mg/dL (ref 5–40)

## 2020-04-24 LAB — COMPREHENSIVE METABOLIC PANEL
ALT: 24 IU/L (ref 0–44)
AST: 22 IU/L (ref 0–40)
Albumin/Globulin Ratio: 1.6 (ref 1.2–2.2)
Albumin: 4.4 g/dL (ref 3.8–4.8)
Alkaline Phosphatase: 133 IU/L — ABNORMAL HIGH (ref 44–121)
BUN/Creatinine Ratio: 15 (ref 10–24)
BUN: 26 mg/dL (ref 8–27)
Bilirubin Total: 0.3 mg/dL (ref 0.0–1.2)
CO2: 18 mmol/L — ABNORMAL LOW (ref 20–29)
Calcium: 9.6 mg/dL (ref 8.6–10.2)
Chloride: 105 mmol/L (ref 96–106)
Creatinine, Ser: 1.79 mg/dL — ABNORMAL HIGH (ref 0.76–1.27)
Globulin, Total: 2.8 g/dL (ref 1.5–4.5)
Glucose: 137 mg/dL — ABNORMAL HIGH (ref 65–99)
Potassium: 4.9 mmol/L (ref 3.5–5.2)
Sodium: 142 mmol/L (ref 134–144)
Total Protein: 7.2 g/dL (ref 6.0–8.5)
eGFR: 42 mL/min/{1.73_m2} — ABNORMAL LOW (ref >59)

## 2020-04-24 LAB — CBC
Hematocrit: 42.6 % (ref 37.5–51.0)
Hemoglobin: 14.2 g/dL (ref 13.0–17.7)
MCH: 26.7 pg (ref 26.6–33.0)
MCHC: 33.3 g/dL (ref 31.5–35.7)
MCV: 80 fL (ref 79–97)
Platelets: 321 10*3/uL (ref 150–450)
RBC: 5.31 x10E6/uL (ref 4.14–5.80)
RDW: 13 % (ref 11.6–15.4)
WBC: 5.8 10*3/uL (ref 3.4–10.8)

## 2020-04-24 LAB — VITAMIN D 25 HYDROXY (VIT D DEFICIENCY, FRACTURES): Vit D, 25-Hydroxy: 68.2 ng/mL (ref 30.0–100.0)

## 2020-04-24 LAB — TSH: TSH: 2.92 u[IU]/mL (ref 0.450–4.500)

## 2020-04-29 ENCOUNTER — Encounter: Payer: PPO | Admitting: Physician Assistant

## 2020-05-07 DIAGNOSIS — N183 Chronic kidney disease, stage 3 unspecified: Secondary | ICD-10-CM | POA: Diagnosis not present

## 2020-05-07 DIAGNOSIS — I129 Hypertensive chronic kidney disease with stage 1 through stage 4 chronic kidney disease, or unspecified chronic kidney disease: Secondary | ICD-10-CM | POA: Diagnosis not present

## 2020-05-07 DIAGNOSIS — E875 Hyperkalemia: Secondary | ICD-10-CM | POA: Diagnosis not present

## 2020-05-07 DIAGNOSIS — E1122 Type 2 diabetes mellitus with diabetic chronic kidney disease: Secondary | ICD-10-CM | POA: Diagnosis not present

## 2020-05-21 ENCOUNTER — Encounter: Payer: Self-pay | Admitting: Physician Assistant

## 2020-05-21 ENCOUNTER — Ambulatory Visit (INDEPENDENT_AMBULATORY_CARE_PROVIDER_SITE_OTHER): Payer: Medicare Other | Admitting: Physician Assistant

## 2020-05-21 ENCOUNTER — Other Ambulatory Visit: Payer: Self-pay

## 2020-05-21 VITALS — BP 127/67 | HR 69 | Temp 97.8°F | Ht 71.0 in | Wt 238.5 lb

## 2020-05-21 DIAGNOSIS — Z1159 Encounter for screening for other viral diseases: Secondary | ICD-10-CM

## 2020-05-21 DIAGNOSIS — E1159 Type 2 diabetes mellitus with other circulatory complications: Secondary | ICD-10-CM

## 2020-05-21 DIAGNOSIS — Z0000 Encounter for general adult medical examination without abnormal findings: Secondary | ICD-10-CM

## 2020-05-21 DIAGNOSIS — Z125 Encounter for screening for malignant neoplasm of prostate: Secondary | ICD-10-CM

## 2020-05-21 DIAGNOSIS — Z23 Encounter for immunization: Secondary | ICD-10-CM | POA: Diagnosis not present

## 2020-05-21 DIAGNOSIS — I152 Hypertension secondary to endocrine disorders: Secondary | ICD-10-CM | POA: Diagnosis not present

## 2020-05-21 DIAGNOSIS — E1129 Type 2 diabetes mellitus with other diabetic kidney complication: Secondary | ICD-10-CM

## 2020-05-21 DIAGNOSIS — E785 Hyperlipidemia, unspecified: Secondary | ICD-10-CM

## 2020-05-21 DIAGNOSIS — Z1211 Encounter for screening for malignant neoplasm of colon: Secondary | ICD-10-CM | POA: Diagnosis not present

## 2020-05-21 DIAGNOSIS — Z6833 Body mass index (BMI) 33.0-33.9, adult: Secondary | ICD-10-CM

## 2020-05-21 DIAGNOSIS — E1169 Type 2 diabetes mellitus with other specified complication: Secondary | ICD-10-CM | POA: Diagnosis not present

## 2020-05-21 DIAGNOSIS — E669 Obesity, unspecified: Secondary | ICD-10-CM

## 2020-05-21 LAB — POCT UA - MICROALBUMIN
Albumin/Creatinine Ratio, Urine, POC: <30
Creatinine, POC: 200 mg/dL
Microalbumin Ur, POC: 10 mg/L

## 2020-05-21 NOTE — Patient Instructions (Signed)

## 2020-05-21 NOTE — Progress Notes (Signed)
Male physical   Impression and Recommendations:    1. Healthcare maintenance   2. Need for Tdap vaccination   3. Screening for colon cancer   4. Screening for prostate cancer   5. Encounter for hepatitis C screening test for low risk patient   6. Class 1 obesity with serious comorbidity and body mass index (BMI) of 33.0 to 33.9 in adult, unspecified obesity type   7. Hypertension associated with diabetes ([place])   8. 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 Hyperlipidemia associated with type 2 diabetes mellitus ([place])      1) Anticipatory Guidance: Skin CA prevention-recommend to use sunscreen when outside along with skin surveillance; eat a balanced and modest diet; physical activity at least 25 minutes per day or minimum of 150 min/ week moderate to intense activity.  2) Immunizations / Screenings / Labs:   All immunizations are up-to-date per recommendations or will be updated today if pt allows.    - Patient understands with dental and vision screens they will schedule independently.  - Obtained CBC, CMP, Lipid panel, TSH and vit D when fasting. Most labs are essentially within normal limits or stable from prior. Last A1c stable. - Will place order for colonoscopy, Hep C screening, PSA screening.   3) Weight:  Recommend to continue to improve diet habits to improve overall feelings of well being and objective health data. Improve nutrient density of diet through increasing intake of fruits and vegetables and decreasing saturated fats, white flour products and refined sugars.  -Associated with diabetes mellitus, hypertension and hyperlipidemia.  4) Healthcare Maintenance: -Continue current medication regimen. Recommend to start taking OTC Vit D3 2,000 units once weekly Vit D therapy is completed to reduce risk of hypervitaminosis.  -Continue to follow-up with various specialists. -Continue to monitor  carbohydrates and glucose intake, follow a heart healthy diet. -UA microalbumin collected, wnl's. -Follow up in 3 months for DM, HTN, HLD  Orders Placed This Encounter  Procedures  . Tdap vaccine greater than or equal to 7yo IM  . PSA  . Hepatitis C Antibody  . Ambulatory referral to Gastroenterology    Referral Priority:   Routine    Referral Type:   Consultation    Referral Reason:   Specialty Services Required    Number of Visits Requested:   1  . POCT UA - Microalbumin    No orders of the defined types were placed in this encounter.    Return in about 3 months (around 08/21/2020) for DM, HLD, HTN.    Gross side effects, risk and benefits, and alternatives of medications discussed with patient.  Patient is aware that all medications have potential side effects and we are unable to predict every side effect or drug-drug interaction that may occur.  Expresses verbal understanding and consents to current therapy plan and treatment regimen.  Please see AVS handed out to patient at the end of our visit for further patient instructions/ counseling done pertaining to today's office visit.     Subjective:      CC: CPE   HPI: Lavaughn Haberle is a 65 y.o. male who presents to [place] at [facility] today for a yearly health maintenance exam.     Health Maintenance Summary  - Reviewed and updated, unless pt declines services.  Last Cologuard or Colonoscopy:  10/11/2015- repeat in 5 years, tubular adenoma Tobacco History Reviewed: Y, former  smoker w/ 6 yr pck hx CT scan for screening lung CA:  N/A Alcohol / drug use:    No concerns, no use / no use Dental Home: Y  Eye exams:Y Male history: STD concerns:   none, monogamous Additional penile/ urinary concerns:  Nocturia, stable   Additional concerns beyond Health Maintenance issues:   none    Immunization History  Administered Date(s) Administered  . Influenza Split 12/10/2010  . Influenza,inj,Quad  PF,6+ Mos 10/19/2012, 11/23/2014, 10/22/2015, 11/10/2016, 11/08/2017  . Influenza,inj,quad, With Preservative 11/10/2016  . Influenza-Unspecified 09/25/2018  . PFIZER(Purple Top)SARS-COV-2 Vaccination 03/08/2019, 04/08/2019, 12/12/2019  . Pneumococcal Conjugate-13 12/07/2014  . Pneumococcal Polysaccharide-23 04/17/2016  . Td 08/02/2008  . Tdap 05/21/2020     Health Maintenance  Topic Date Due  . Hepatitis C Screening  Never done  . OPHTHALMOLOGY EXAM  10/06/2018  . FOOT EXAM  05/31/2020  . INFLUENZA VACCINE  08/05/2020  . HEMOGLOBIN A1C  10/02/2020  . COLONOSCOPY (Pts 45-48yr Insurance coverage will need to be confirmed)  10/10/2020  . PNA vac Low Risk Adult (2 of 2 - PPSV23) 04/17/2021  . TETANUS/TDAP  05/22/2030  . COVID-19 Vaccine  Completed  . HIV Screening  Completed  . HPV VACCINES  Aged Out       Wt Readings from Last 3 Encounters:  05/21/20 238 lb 8 oz (108.2 kg)  04/17/20 235 lb 3.2 oz (106.7 kg)  04/03/20 234 lb 12.6 oz (106.5 kg)   BP Readings from Last 3 Encounters:  05/21/20 127/67  04/17/20 102/74  04/03/20 (!) 141/78   Pulse Readings from Last 3 Encounters:  05/21/20 69  04/17/20 69  04/03/20 69    Patient Active Problem List   Diagnosis Date Noted  . Biliary colic   . Umbilical hernia without obstruction and without gangrene   . Right upper quadrant abdominal pain 03/20/2020  . Pain due to onychomycosis of toenails of both feet 10/25/2019  . Diabetes mellitus without complication ([place] 172/09/4707 . Hammer toe of second toe of left foot 08/01/2019  . Hammer toe of second toe of right foot 08/01/2019  . Hav (hallux abducto valgus), unspecified laterality 08/01/2019  . Corns and callosities 08/01/2019  . Corn or callus 04/03/2019  . Morbid obesity ([place] 04/03/2019  . Diabetes mellitus with proteinuria ([place] 04/03/2019  . Gastroesophageal reflux disease 04/03/2019  . Insomnia 04/03/2019  . Sleep disorder 12/05/2018  . CAD (coronary artery  disease) 10/26/2018  . 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 Coronary artery disease involving native coronary artery of native heart with unstable angina pectoris ([place]   . Chest pain 10/24/2018  . Back pain 09/26/2018  . Type 2 diabetes mellitus with renal complication ([place] 062/83/6629 . Diabetes mellitus ([place] 02/08/2018  . Chronic kidney disease (CKD), stage III (moderate) 02/08/2018  . Tinea corporis 11/08/2017  . Chronic left hip pain 07/26/2017  . Facet arthropathy, lumbosacral 07/26/2017  . DDD (degenerative disc disease), lumbosacral 07/26/2017  . Attention deficit hyperactivity disorder (ADHD), predominantly inattentive type 07/26/2017  . 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 Overweight (BMI 25.0-29.9) 04/25/2017  . Noncompliance 03/24/2017  . Abdominal bloating 05/15/2015  . UTI (urinary tract infection) 04/20/2014  . Prostatitis 04/20/2014  . Hyperglycemia 04/20/2014  . Fatigue 03/07/2014  . Family history of early CAD 03/07/2014  . Maxillary sinusitis, acute 12/06/2013  . Tinea versicolor 12/06/2013  . Physical exam 11/06/2013  . Chronic Low back pain- w  lumbar radiculopathy 07/05/2013  . Mood disorder ([place]) 03/29/2013  . Other malaise and fatigue 03/29/2013  . Decreased libido 03/29/2013  . Hematuria 02/17/2013  . Right elbow pain 12/15/2012  . Pustular folliculitis 96/04/5407  . Left ankle pain 03/15/2012  . Hypertension associated with diabetes ([place]) 12/01/2011  . Renal insufficiency 12/01/2011  . SPINAL STENOSIS, LUMBAR 08/02/2008  . Vitamin D deficiency 11/30/2007  . GOUT 11/30/2007  . BPH (benign prostatic hyperplasia) 11/30/2007  . Diabetes mellitus with renal manifestation ([place]) 06/11/2006  . ANEMIA NEC 06/11/2006  . Chronic back pain greater than 3 months duration 06/11/2006  . Hyperlipidemia associated with type 2 diabetes mellitus ([place]) 05/26/2006  . OBSTRUCTIVE SLEEP APNEA 05/26/2006     Past Medical History:  Diagnosis Date  . ADHD   . Anemia   . BPH (benign prostatic hyperplasia)   . Carpal tunnel syndrome, bilateral   . Carpal tunnel syndrome, right    nerve impingement right arm/wears brace  . Chronic back pain   . CKD (chronic kidney disease)   . Coronary artery disease   . Diabetes mellitus   . Dyspnea   . GERD (gastroesophageal reflux disease)   . Gout   . Hepatic steatosis   . History of angina   . HLD (hyperlipidemia)   . 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 Hyperlipidemia   . Hypertension   . Insomnia   . Obstructive sleep apnea   . OSA (obstructive sleep apnea)     Past Surgical History:  Procedure Laterality Date  . CARDIAC CATHETERIZATION    . CARPAL TUNNEL RELEASE  09/10/2015   RIGHT WRIST / WITH NERVE IMPINGEMENT SURGERY  . CERVICAL FUSION     Dr Sherwood Gambler  . COLONOSCOPY  2007   negative; [facility]  . CORONARY ANGIOGRAPHY N/A 10/25/2018   Procedure: CORONARY ANGIOGRAPHY (CATH LAB);  Surgeon: Sherren Mocha, MD;  Location: [facility];  Service: Cardiovascular;  Laterality: N/A;  . CORONARY ARTERY BYPASS GRAFT N/A 10/26/2018   Procedure: CORONARY ARTERY BYPASS GRAFTING (CABG) x3 using the LIMA to LAD and right greater saphenous vein via endoscopic harvest to the Diag 1 and OM 2.;  Surgeon: Lajuana Matte, MD;  Location: [place];  Service: Open Heart Surgery;  Laterality: N/A;  . ESOPHAGEAL DILATION  2007  . 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 LUMBAR FUSION      Dr Sherwood Gambler  . UMBILICAL HERNIA REPAIR N/A 04/03/2020   Procedure: HERNIA REPAIR UMBILICAL ADULT, open;  Surgeon: Olean Ree, MD;  Location: [facility];  Service: General;  Laterality: N/A;    Family History  Problem Relation Age of Onset  . Heart disease Father        pacer  . Urolithiasis Father   . Heart attack Father   . Aneurysm Mother 56       CNS aneurysm  . Sudden death Mother   .  Diabetes Sister   . Cancer Maternal Uncle        ? primary  . Cancer Paternal Uncle        ? primary  . Kidney disease Brother   . Heart disease Sister   . Heart disease Sister   . Hyperlipidemia Neg Hx   . Hypertension Neg Hx   . Colon cancer Neg Hx     Social History   Substance and Sexual Activity  Drug Use No  ,  Social History   Substance and Sexual Activity  Alcohol Use No  ,  Social History   Tobacco Use  Smoking Status Former Smoker  . Packs/day: 1.50  . Years: 4.00  . Pack years: 6.00  . Types: Cigarettes  . Quit date: 01/06/1976  . Years since quitting: 44.4  Smokeless Tobacco Never Used  Tobacco Comment   smoked 35 years ago as of 2013   ,  Social History   Substance and Sexual Activity  Sexual Activity Yes  . Birth control/protection: None    Patient's Medications  New Prescriptions   No medications on file  Previous Medications   ACETAMINOPHEN (TYLENOL) 500 MG TABLET    Take 2 tablets (1,000 mg total) by mouth every 6 (six) hours as needed.   ACETAMINOPHEN (TYLENOL) 500 MG TABLET    Take 2 tablets (1,000 mg total) by mouth every 6 (six) hours as needed for mild pain.   ALLOPURINOL (ZYLOPRIM) 300 MG TABLET    TAKE 1/2 TABLET BY MOUTH DAILY   ASCORBIC ACID (VITAMIN C) 1000 MG TABLET    Take 1,000 mg by mouth daily.   ASPIRIN 81 MG TABLET    Take 81 mg by mouth daily.   ATORVASTATIN (LIPITOR) 80 MG TABLET    TAKE 1 TABLET BY MOUTH EACH NIGHT AT BEDTIME   BENAZEPRIL (LOTENSIN) 10 MG TABLET    TAKE 1 TABLET BY MOUTH DAILY   BLOOD GLUCOSE METER KIT AND SUPPLIES    Dispense based on patient and insurance preference. Use to check fasting blood glucose in the morning and to check glucose 2 hours after largest meal of the day. (FOR ICD-10 E10.9, E11.9).   FLUOXETINE (PROZAC) 40 MG CAPSULE    TAKE 1 CAPSULE BY MOUTH DAILY   GABAPENTIN (NEURONTIN) 800 MG TABLET    TAKE 1 TABLET BY MOUTH 3 TIMES DAILY   GLUCOSE BLOOD TEST STRIP    Use as instructed   INSULIN  PEN NEEDLE (PEN NEEDLES) 31G X 8 MM MISC    1 each by Does not apply route daily.   JARDIANCE 10 MG TABS TABLET    TAKE 1 TABLET BY MOUTH DAILY BEFORE BREAKFAST   LANCETS ULTRA FINE MISC    Use to check fasting blood glucose in the morning and to check glucose 2 hours after largest meal of the day   LIRAGLUTIDE (VICTOZA) 18 MG/3ML SOPN    INJECT 1.2 MG ONCE DAILY.   METOPROLOL SUCCINATE (TOPROL-XL) 25 MG 24 HR TABLET    Take 1 tablet (25 mg total) by mouth daily.   MULTIPLE VITAMINS-IRON (MULTIVITAMINS WITH IRON) TABS    Take 1 tablet by mouth daily.   PANTOPRAZOLE (PROTONIX) 40 MG TABLET    TAKE 1 TABLET BY MOUTH DAILY   POLYETHYLENE GLYCOL POWDER (GLYCOLAX/MIRALAX) POWDER    USE AS DIRECTED   PYRIDOXINE (B-6) 100 MG TABLET    Take 100 mg by mouth daily.   TRAMADOL (ULTRAM) 50 MG TABLET    Take 1-2 tablets (50-100 mg total) by mouth every 4 (four) hours as needed for moderate pain.   VITAMIN D, ERGOCALCIFEROL, (DRISDOL) 1.25 MG (50000 UNIT) CAPS CAPSULE    TAKE 1 CAPSULE BY MOUTH EVERY 7 DAYS  Modified Medications   No medications on file  Discontinued Medications   No medications on file    Prednisone  Review of Systems: General:   Denies fever, chills, unexplained weight loss.  Optho/Auditory:   Denies  visual changes, blurred vision/LOV Respiratory:   Denies SOB, DOE more than baseline levels.   Cardiovascular:   Denies chest pain, palpitations, new onset peripheral edema  Gastrointestinal:   Denies nausea, vomiting, diarrhea.  Genitourinary: Denies dysuria, urgency, flank pain, +frequency  Endocrine:     Denies hot or cold intolerance, polyuria, polydipsia. Musculoskeletal:   Denies unexplained myalgias, joint swelling, unexplained arthralgias, gait problems.  Skin:  Denies rash, suspicious lesions Neurological:     Denies dizziness, unexplained weakness, numbness  Psychiatric/Behavioral:   Denies mood changes, suicidal or homicidal ideations, hallucinations    Objective:      Blood pressure 127/67, pulse 69, temperature 97.8 F (36.6 C), height _0  (1.803 m), weight 238 lb 8 oz (108.2 kg), SpO2 95 %. Body mass index is 33.26 kg/m. General Appearance:    Alert, cooperative, no distress, appears stated age  Head:    Normocephalic, without obvious abnormality, atraumatic  Eyes:    PERRL, conjunctiva/corneas clear, EOM's intact, both eyes  Ears:    Normal TM's and external ear canals, both ears  Nose:   Nares normal, septum midline, mucosa normal, no drainage    or sinus tenderness  Throat:   Lips w/o lesion, mucosa moist, and tongue normal; teeth and gums normal  Neck:   Supple, symmetrical, trachea midline, no adenopathy;    thyroid:  no enlargement/tenderness/nodules; no JVD  Back:     Symmetric, no curvature, ROM normal, no CVA tenderness  Lungs:     Clear to auscultation bilaterally, respirations unlabored, no Wh/ R/ R  Chest Wall:    No tenderness or gross deformity; normal excursion   Heart:    Regular rate and rhythm, S1 and S2 normal, no murmur, rub   or gallop  Abdomen:     Soft, non-tender, bowel sounds active all four quadrants, No G/R/R, no masses, no organomegaly, well healed surgical scars  Genitalia:   Deferred by pt.  Rectal:   Deferred by pt. Obtaining PSA.  Extremities:   Extremities normal, no cyanosis or gross edema, R foot hammer toe (2nd toe), +corn  Pulses:   2+ and symmetric all extremities  Skin:   Warm, dry, Skin color, texture, turgor normal, no obvious rashes or lesions  M-Sk:   Ambulates * 4 w/o difficulty, no gross deformities, tone WNL  Neurologic:   CNII-XII intact, normal strength, sensation and reflexes    Throughout Psych:  No HI/SI, judgement and insight good, Euthymic mood. Full Affect.

## 2020-05-22 LAB — PSA: Prostate Specific Ag, Serum: 1.6 ng/mL (ref 0.0–4.0)

## 2020-05-22 LAB — HEPATITIS C ANTIBODY: Hep C Virus Ab: <0.1 s/co ratio (ref 0.0–0.9)

## 2020-05-27 ENCOUNTER — Ambulatory Visit: Payer: Medicare Other | Admitting: Podiatry

## 2020-05-27 ENCOUNTER — Encounter: Payer: Self-pay | Admitting: Podiatry

## 2020-05-27 ENCOUNTER — Other Ambulatory Visit: Payer: Self-pay

## 2020-05-27 DIAGNOSIS — M2041 Other hammer toe(s) (acquired), right foot: Secondary | ICD-10-CM

## 2020-05-27 DIAGNOSIS — M2010 Hallux valgus (acquired), unspecified foot: Secondary | ICD-10-CM

## 2020-05-27 DIAGNOSIS — N183 Chronic kidney disease, stage 3 unspecified: Secondary | ICD-10-CM | POA: Diagnosis not present

## 2020-05-27 DIAGNOSIS — E119 Type 2 diabetes mellitus without complications: Secondary | ICD-10-CM

## 2020-05-27 DIAGNOSIS — L84 Corns and callosities: Secondary | ICD-10-CM | POA: Diagnosis not present

## 2020-05-27 NOTE — Progress Notes (Signed)
This patient returns to my office for at risk foot care.  This patient requires this care by a professional since this patient will be at risk due to having diabetes and CKD.  This patient is unable to cut painful corn second toe  himself since the patient cannot reach his feet..This   Callus is   painful walking and wearing shoes.  Patient has severe bunion deformities with second toes hammered.  This patient presents for at risk foot care today.  General Appearance  Alert, conversant and in no acute stress.  Vascular  Dorsalis pedis and posterior tibial  pulses are palpable  bilaterally.  Capillary return is within normal limits  bilaterally. Temperature is within normal limits  bilaterally.  Neurologic  Senn-Weinstein monofilament wire test within normal limits  bilaterally. Muscle power within normal limits bilaterally.  Nails Thick disfigured discolored nails with subungual debris  from hallux to fifth toes bilaterally. No evidence of bacterial infection or drainage bilaterally.  Orthopedic  No limitations of motion  feet .  No crepitus or effusions noted.  No bony pathology or digital deformities noted.  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  Skin  normotropic skin with no porokeratosis noted bilaterally.  No signs of infections or ulcers noted.  Corn on the medial aspect second toe right foot.  Corn second toe right foot.  Consent was obtained for treatment procedures.   Debridement of corn with # 15 blade second toe right foot.    Filed with dremel without incident. Padding dispensed.  Told patient he needs surgery to correct this problem. Made an appointment to see Fr.  Regal.   Return office visit     prn                Told patient to return for periodic foot care and evaluation due to potential at risk complications.   Gardiner Barefoot DPM

## 2020-06-01 DIAGNOSIS — U071 COVID-19: Secondary | ICD-10-CM | POA: Diagnosis not present

## 2020-06-06 ENCOUNTER — Ambulatory Visit (INDEPENDENT_AMBULATORY_CARE_PROVIDER_SITE_OTHER): Payer: Medicare Other

## 2020-06-06 ENCOUNTER — Encounter: Payer: Self-pay | Admitting: Podiatry

## 2020-06-06 ENCOUNTER — Ambulatory Visit: Payer: Medicare Other | Admitting: Podiatry

## 2020-06-06 ENCOUNTER — Other Ambulatory Visit: Payer: Self-pay

## 2020-06-06 DIAGNOSIS — M79671 Pain in right foot: Secondary | ICD-10-CM

## 2020-06-06 DIAGNOSIS — M79672 Pain in left foot: Secondary | ICD-10-CM | POA: Diagnosis not present

## 2020-06-06 DIAGNOSIS — M2041 Other hammer toe(s) (acquired), right foot: Secondary | ICD-10-CM

## 2020-06-06 DIAGNOSIS — D169 Benign neoplasm of bone and articular cartilage, unspecified: Secondary | ICD-10-CM | POA: Diagnosis not present

## 2020-06-06 NOTE — Progress Notes (Signed)
Subjective:   Patient ID: [name], male   DOB: 65 y.o.   MRN: [id number]   HPI Patient presents with digital deformity of digit to right big toe right stating its been very sore.  Patient has long-term history of diabetes under good control last A1c approximately 7 fasting sugars around 130 and states has had this trimmed padded without relief of symptoms gradually becoming more of an issue for him   ROS      Objective:  Physical Exam  Neurovascular status was found to be intact good digital perfusion was noted patient does not smoke.  Patient is noted to have a significant keratotic lesion digit to right over left with rotation of the big toe pressing against it with keratotic lesion on the big toe also.  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  He is motivated to have this done and is been referred to me for procedure and at this point I allowed him to read consent form going over alternative treatments complications associated with this type of repair.  Patient is willing to accept risk and understands all complications and signed consent form scheduled for outpatient surgery and is encouraged to try to keep it under good control until the surgery is done.  All questions answered understands total recovery can take 6 months with long-term swelling and reoccurrence as possibilities  X-rays indicate there is abutment between the big toe second toe right over left foot with bone spur formation and pressure between the 2 digits occurring no indications of osteolysis

## 2020-06-07 ENCOUNTER — Telehealth: Payer: Self-pay | Admitting: Urology

## 2020-06-07 NOTE — Telephone Encounter (Signed)
DOS - 06/11/20   HAMMERTOE REPAIR 2ND RIGHT --- 22575 EXOSTECTOMY 1ST RIGHT --- 05183    [employer] EFFECTIVE DATE - 01/06/20   PLAN DEDUCTIBLE - $0.00 W/ $0.00 REMAINING OUT OF POCKET - $4,200.00 W/ $3,353.68 REMAINING COINSURANCE - 0% COPAY - $275.00   NO PRIOR AUTH REQUIRED

## 2020-06-10 DIAGNOSIS — N471 Phimosis: Secondary | ICD-10-CM | POA: Diagnosis not present

## 2020-06-10 MED ORDER — HYDROCODONE-ACETAMINOPHEN 10-325 MG PO TABS: 1.0000 | ORAL_TABLET | Freq: Three times a day (TID) | ORAL | 0 refills | Status: AC | PRN

## 2020-06-10 NOTE — Addendum Note (Signed)
Addended by: Wallene Huh on: 06/10/2020 05:11 PM   Modules accepted: Orders

## 2020-06-11 ENCOUNTER — Encounter: Payer: Self-pay | Admitting: Podiatry

## 2020-06-11 DIAGNOSIS — M25774 Osteophyte, right foot: Secondary | ICD-10-CM | POA: Diagnosis not present

## 2020-06-11 DIAGNOSIS — M2041 Other hammer toe(s) (acquired), right foot: Secondary | ICD-10-CM | POA: Diagnosis not present

## 2020-06-11 DIAGNOSIS — M898X7 Other specified disorders of bone, ankle and foot: Secondary | ICD-10-CM | POA: Diagnosis not present

## 2020-06-14 ENCOUNTER — Telehealth: Payer: Self-pay

## 2020-06-14 NOTE — Telephone Encounter (Signed)
Post-op call: Spoke to patient about status post surgery. Patient stated that he is doing really good at this time. Patient stated he has not taken any pain medication in over 48 hours. Patient denies fever, chills, nausea, or vomiting. He also denies shortness of breath and tightness in the calf. Patient confirms bandage is clean, dry and intact and he understood all post-op instructions. Patient was also reminded of his upcoming post-op visit with the date and time.

## 2020-06-17 ENCOUNTER — Ambulatory Visit (INDEPENDENT_AMBULATORY_CARE_PROVIDER_SITE_OTHER): Payer: Medicare Other

## 2020-06-17 ENCOUNTER — Encounter: Payer: Self-pay | Admitting: Podiatry

## 2020-06-17 ENCOUNTER — Ambulatory Visit (INDEPENDENT_AMBULATORY_CARE_PROVIDER_SITE_OTHER): Payer: Medicare Other | Admitting: Podiatry

## 2020-06-17 ENCOUNTER — Other Ambulatory Visit: Payer: Self-pay

## 2020-06-17 DIAGNOSIS — M79671 Pain in right foot: Secondary | ICD-10-CM

## 2020-06-17 DIAGNOSIS — M79672 Pain in left foot: Secondary | ICD-10-CM

## 2020-06-17 DIAGNOSIS — Z9889 Other specified postprocedural states: Secondary | ICD-10-CM

## 2020-06-18 NOTE — Progress Notes (Signed)
Subjective:   Patient ID: [name], male   DOB: 65 y.o.   MRN: [id number]   HPI Patient states doing very well with surgery very pleased with minimal discomfort   ROS      Objective:  Physical Exam  Neurovascular status intact negative Bevelyn Buckles' sign noted wound edges well coapted second digit right     Assessment:  Doing well post digital surgery exostectomy     Plan:  H&P reviewed condition and x-ray.  Sterile dressing reapplied continue elevation immobilization compression reappoint 2 weeks suture removal earlier if needed  X-rays indicate good alignment noted good resection of bone satisfactory position of digits

## 2020-06-24 ENCOUNTER — Other Ambulatory Visit: Payer: Self-pay | Admitting: Physician Assistant

## 2020-07-01 ENCOUNTER — Other Ambulatory Visit: Payer: Self-pay

## 2020-07-01 ENCOUNTER — Ambulatory Visit (INDEPENDENT_AMBULATORY_CARE_PROVIDER_SITE_OTHER): Payer: Medicare Other | Admitting: Podiatry

## 2020-07-01 ENCOUNTER — Encounter: Payer: Self-pay | Admitting: Podiatry

## 2020-07-01 DIAGNOSIS — M2041 Other hammer toe(s) (acquired), right foot: Secondary | ICD-10-CM

## 2020-07-01 DIAGNOSIS — D169 Benign neoplasm of bone and articular cartilage, unspecified: Secondary | ICD-10-CM

## 2020-07-01 NOTE — Progress Notes (Signed)
Subjective:   Patient ID: [name], male   DOB: 65 y.o.   MRN: [id number]   HPI Doing very well postsurgically with digits in good alignment stitches in place mild keratotic tissue second toe   ROS      Objective:  Physical Exam  Neurovascular status intact negative Homans' sign noted wound edges well coapted hallux second toe with no breakdown of tissue with keratotic tissue formation second digit F2     Assessment:  Doing well overall     Plan:  Debridement of lesion stitches removed wound edges coapted well instructed on the continuation of open toed shoes with gradual return to soft shoe gear over the next couple weeks and hopeful return to work in the next 2 to 3 weeks and encouraged to call questions concerns

## 2020-07-02 ENCOUNTER — Other Ambulatory Visit: Payer: Self-pay | Admitting: Physician Assistant

## 2020-07-02 DIAGNOSIS — E559 Vitamin D deficiency, unspecified: Secondary | ICD-10-CM

## 2020-07-03 DIAGNOSIS — E1139 Type 2 diabetes mellitus with other diabetic ophthalmic complication: Secondary | ICD-10-CM | POA: Diagnosis not present

## 2020-07-03 DIAGNOSIS — H43812 Vitreous degeneration, left eye: Secondary | ICD-10-CM | POA: Diagnosis not present

## 2020-07-05 DIAGNOSIS — N471 Phimosis: Secondary | ICD-10-CM | POA: Diagnosis not present

## 2020-07-09 ENCOUNTER — Other Ambulatory Visit: Payer: Self-pay | Admitting: Physician Assistant

## 2020-07-10 ENCOUNTER — Ambulatory Visit: Payer: Medicare Other | Admitting: Podiatry

## 2020-07-11 ENCOUNTER — Other Ambulatory Visit: Payer: Self-pay | Admitting: Physician Assistant

## 2020-07-22 DIAGNOSIS — N471 Phimosis: Secondary | ICD-10-CM | POA: Diagnosis not present

## 2020-08-05 DIAGNOSIS — H43812 Vitreous degeneration, left eye: Secondary | ICD-10-CM | POA: Diagnosis not present

## 2020-08-22 ENCOUNTER — Other Ambulatory Visit: Payer: Self-pay | Admitting: Physician Assistant

## 2020-08-22 DIAGNOSIS — E1169 Type 2 diabetes mellitus with other specified complication: Secondary | ICD-10-CM

## 2020-08-22 DIAGNOSIS — E1129 Type 2 diabetes mellitus with other diabetic kidney complication: Secondary | ICD-10-CM

## 2020-08-26 ENCOUNTER — Telehealth: Payer: Self-pay | Admitting: Physician Assistant

## 2020-08-26 NOTE — Telephone Encounter (Signed)
Spoke with pharmacy staff and gave medication directions. AS, CMA

## 2020-08-26 NOTE — Telephone Encounter (Signed)
Pharmacy is waiting on the dosage amount of Victoza to fill it. [facility], thanks.

## 2020-08-28 ENCOUNTER — Telehealth: Payer: Self-pay | Admitting: Physician Assistant

## 2020-08-28 ENCOUNTER — Ambulatory Visit: Payer: Medicare Other | Admitting: Podiatry

## 2020-08-28 NOTE — Telephone Encounter (Signed)
Spoke with [employer] to clarify quantity of medication. 20ml for 30 days is covered by insurance while 57ml needs to have a prior authorization to be given to patient.

## 2020-08-28 NOTE — Telephone Encounter (Signed)
[employer] needs a pre authorization and quantity and dosage schedule for the Victoza. Please advise.

## 2020-09-16 ENCOUNTER — Other Ambulatory Visit: Payer: Self-pay | Admitting: Physician Assistant

## 2020-09-28 ENCOUNTER — Ambulatory Visit: Payer: Medicare Other

## 2020-09-30 ENCOUNTER — Other Ambulatory Visit: Payer: Self-pay | Admitting: Physician Assistant

## 2020-09-30 DIAGNOSIS — E559 Vitamin D deficiency, unspecified: Secondary | ICD-10-CM

## 2020-09-30 DIAGNOSIS — K819 Cholecystitis, unspecified: Secondary | ICD-10-CM

## 2020-09-30 DIAGNOSIS — M109 Gout, unspecified: Secondary | ICD-10-CM

## 2020-10-01 ENCOUNTER — Ambulatory Visit (AMBULATORY_SURGERY_CENTER): Payer: Medicare Other

## 2020-10-01 ENCOUNTER — Encounter: Payer: Self-pay | Admitting: Gastroenterology

## 2020-10-01 ENCOUNTER — Other Ambulatory Visit: Payer: Self-pay

## 2020-10-01 VITALS — Ht 71.0 in | Wt 235.0 lb

## 2020-10-01 DIAGNOSIS — Z86010 Personal history of colonic polyps: Secondary | ICD-10-CM

## 2020-10-01 DIAGNOSIS — N5201 Erectile dysfunction due to arterial insufficiency: Secondary | ICD-10-CM | POA: Diagnosis not present

## 2020-10-01 MED ORDER — PEG 3350-KCL-NA BICARB-NACL 420 G PO SOLR: 4000.0000 mL | Freq: Once | ORAL | 0 refills | Status: AC

## 2020-10-01 NOTE — Progress Notes (Signed)

## 2020-10-21 ENCOUNTER — Other Ambulatory Visit: Payer: Self-pay | Admitting: Physician Assistant

## 2020-10-21 ENCOUNTER — Encounter: Payer: Self-pay | Admitting: Physician Assistant

## 2020-10-21 ENCOUNTER — Other Ambulatory Visit: Payer: Self-pay

## 2020-10-21 ENCOUNTER — Ambulatory Visit (INDEPENDENT_AMBULATORY_CARE_PROVIDER_SITE_OTHER): Payer: Medicare Other | Admitting: Physician Assistant

## 2020-10-21 VITALS — BP 113/70 | HR 66 | Temp 98.0°F | Wt 238.0 lb

## 2020-10-21 DIAGNOSIS — Z0000 Encounter for general adult medical examination without abnormal findings: Secondary | ICD-10-CM

## 2020-10-21 DIAGNOSIS — F39 Unspecified mood [affective] disorder: Secondary | ICD-10-CM

## 2020-10-21 DIAGNOSIS — K819 Cholecystitis, unspecified: Secondary | ICD-10-CM | POA: Diagnosis not present

## 2020-10-21 DIAGNOSIS — M109 Gout, unspecified: Secondary | ICD-10-CM | POA: Diagnosis not present

## 2020-10-21 DIAGNOSIS — E1129 Type 2 diabetes mellitus with other diabetic kidney complication: Secondary | ICD-10-CM

## 2020-10-21 LAB — POCT GLYCOSYLATED HEMOGLOBIN (HGB A1C): Hemoglobin A1C: 7.5 % — AB (ref 4.0–5.6)

## 2020-10-21 MED ORDER — FLUOXETINE HCL 40 MG PO CAPS: 40.0000 mg | ORAL_CAPSULE | Freq: Every day | ORAL | 0 refills | Status: DC

## 2020-10-21 MED ORDER — ALLOPURINOL 300 MG PO TABS: 150.0000 mg | ORAL_TABLET | Freq: Every day | ORAL | 0 refills | Status: DC

## 2020-10-21 MED ORDER — PANTOPRAZOLE SODIUM 40 MG PO TBEC: 40.0000 mg | DELAYED_RELEASE_TABLET | Freq: Every day | ORAL | 0 refills | Status: DC

## 2020-10-21 MED ORDER — OZEMPIC (0.25 OR 0.5 MG/DOSE) 2 MG/1.5ML ~~LOC~~ SOPN: PEN_INJECTOR | SUBCUTANEOUS | 3 refills | Status: DC

## 2020-10-21 NOTE — Patient Instructions (Signed)
Preventive Care 66 Years and Older, Male Preventive care refers to lifestyle choices and visits with your health care provider that can promote health and wellness. This includes: A yearly physical exam. This is also called an annual wellness visit. Regular dental and eye exams. Immunizations. Screening for certain conditions. Healthy lifestyle choices, such as: Eating a healthy diet. Getting regular exercise. Not using drugs or products that contain nicotine and tobacco. Limiting alcohol use. What can I expect for my preventive care visit? Physical exam Your health care provider will check your: Height and weight. These may be used to calculate your BMI (body mass index). BMI is a measurement that tells if you are at a healthy weight. Heart rate and blood pressure. Body temperature. Skin for abnormal spots. Counseling Your health care provider may ask you questions about your: Past medical problems. Family's medical history. Alcohol, tobacco, and drug use. Emotional well-being. Home life and relationship well-being. Sexual activity. Diet, exercise, and sleep habits. History of falls. Memory and ability to understand (cognition). Work and work Statistician. Access to firearms. What immunizations do I need? Vaccines are usually given at various ages, according to a schedule. Your health care provider will recommend vaccines for you based on your age, medical history, and lifestyle or other factors, such as travel or where you work. What tests do I need? Blood tests Lipid and cholesterol levels. These may be checked every 5 years, or more often depending on your overall health. Hepatitis C test. Hepatitis B test. Screening Lung cancer screening. You may have this screening every year starting at age 66 if you have a 30-pack-year history of smoking and currently smoke or have quit within the past 15 years. Colorectal cancer screening. All adults should have this screening  starting at age 66 and continuing until age 73. Your health care provider may recommend screening at age 66 if you are at increased risk. You will have tests every 1-10 years, depending on your results and the type of screening test. Prostate cancer screening. Recommendations will vary depending on your family history and other risks. Genital exam to check for testicular cancer or hernias. Diabetes screening. This is done by checking your blood sugar (glucose) after you have not eaten for a while (fasting). You may have this done every 1-3 years. Abdominal aortic aneurysm (AAA) screening. You may need this if you are a current or former smoker. STD (sexually transmitted disease) testing, if you are at risk. Follow these instructions at home: Eating and drinking  Eat a diet that includes fresh fruits and vegetables, whole grains, lean protein, and low-fat dairy products. Limit your intake of foods with high amounts of sugar, saturated fats, and salt. Take vitamin and mineral supplements as recommended by your health care provider. Do not drink alcohol if your health care provider tells you not to drink. If you drink alcohol: Limit how much you have to 0-2 drinks a day. Be aware of how much alcohol is in your drink. In the [place] one drink equals one 12 oz bottle of beer (355 mL), one 5 oz glass of wine (148 mL), or one 1 oz glass of hard liquor (44 mL). Lifestyle Take daily care of your teeth and gums. Brush your teeth every morning and night with fluoride toothpaste. Floss one time each day. Stay active. Exercise for at least 30 minutes 5 or more days each week. Do not use any products that contain nicotine or tobacco, such as cigarettes, e-cigarettes, and chewing tobacco. If  you need help quitting, ask your health care provider. Do not use drugs. If you are sexually active, practice safe sex. Use a condom or other form of protection to prevent STIs (sexually transmitted infections). Talk  with your health care provider about taking a low-dose aspirin or statin. Find healthy ways to cope with stress, such as: Meditation, yoga, or listening to music. Journaling. Talking to a trusted person. Spending time with friends and family. Safety Always wear your seat belt while driving or riding in a vehicle. Do not drive: If you have been drinking alcohol. Do not ride with someone who has been drinking. When you are tired or distracted. While texting. Wear a helmet and other protective equipment during sports activities. If you have firearms in your house, make sure you follow all gun safety procedures. What's next? Visit your health care provider once a year for an annual wellness visit. Ask your health care provider how often you should have your eyes and teeth checked. Stay up to date on all vaccines. This information is not intended to replace advice given to you by your health care provider. Make sure you discuss any questions you have with your health care provider. Document Revised: 03/01/64 Document Reviewed: 12/16/2017 Elsevier Patient Education  2022 [employer].

## 2020-10-21 NOTE — Progress Notes (Signed)
Subjective:   [name] is a 65 y.o. male who presents for an Initial Medicare Annual Wellness Visit.  Review of Systems    General:   No F/C, wt loss Pulm:   No DIB, SOB, pleuritic chest pain Card:  No CP, palpitations Abd:  No n/v/d or pain Ext:  No inc edema from baseline    Objective:    Today's Vitals   10/21/20 1546  BP: 113/70  Pulse: 66  Temp: 98 F (36.7 C)  SpO2: 98%  Weight: 238 lb (108 kg)   Body mass index is 33.19 kg/m.  Advanced Directives 03/29/2020 10/25/2018 01/21/2017 11/23/2014  Does Patient Have a Medical Advance Directive? No No No No  Would patient like information on creating a medical advance directive? No - Patient declined No - Patient declined No - Patient declined Yes - Educational materials given    Current Medications (verified) Outpatient Encounter Medications as of 10/21/2020  Medication Sig   acetaminophen (TYLENOL) 500 MG tablet Take 2 tablets (1,000 mg total) by mouth every 6 (six) hours as needed.   allopurinol (ZYLOPRIM) 300 MG tablet Take 0.5 tablets (150 mg total) by mouth daily.   Ascorbic Acid (VITAMIN C) 1000 MG tablet Take 1,000 mg by mouth daily.   aspirin 81 MG tablet Take 81 mg by mouth daily.   atorvastatin (LIPITOR) 80 MG tablet TAKE 1 TABLET BY MOUTH EACH NIGHT AT BEDTIME   benazepril (LOTENSIN) 10 MG tablet TAKE 1 TABLET BY MOUTH DAILY (Patient taking differently: Take 10 mg by mouth daily.)   blood glucose meter kit and supplies Dispense based on patient and insurance preference. Use to check fasting blood glucose in the morning and to check glucose 2 hours after largest meal of the day. (FOR ICD-10 E10.9, E11.9).   FLUoxetine (PROZAC) 40 MG capsule Take 1 capsule (40 mg total) by mouth daily.   gabapentin (NEURONTIN) 800 MG tablet TAKE 1 TABLET BY MOUTH THREE TIMES DAILY   glucose blood test strip Use as instructed   Insulin Pen Needle (PEN NEEDLES) 31G X 8 MM MISC 1 each by Does not apply route daily.   JARDIANCE  10 MG TABS tablet TAKE 1 TABLET BY MOUTH DAILY BEFORE BREAKFAST   LANCETS ULTRA FINE MISC Use to check fasting blood glucose in the morning and to check glucose 2 hours after largest meal of the day   metoprolol succinate (TOPROL-XL) 25 MG 24 hr tablet Take 1 tablet (25 mg total) by mouth daily.   Multiple Vitamins-Iron (MULTIVITAMINS WITH IRON) TABS Take 1 tablet by mouth daily.   pantoprazole (PROTONIX) 40 MG tablet Take 1 tablet (40 mg total) by mouth daily.   polyethylene glycol powder (GLYCOLAX/MIRALAX) powder USE AS DIRECTED (Patient taking differently: Take 17 g by mouth daily as needed for moderate constipation. USE AS DIRECTED)   pyridoxine (B-6) 100 MG tablet Take 100 mg by mouth daily.   Semaglutide,0.25 or 0.5MG/DOS, (OZEMPIC, 0.25 OR 0.5 MG/DOSE,) 2 MG/1.5ML SOPN Inject 0.25 mg into the skin once weekly x 4 weeks. Then inject 0.5 mg into the skin once weekly.   traMADol (ULTRAM) 50 MG tablet Take 1-2 tablets (50-100 mg total) by mouth every 4 (four) hours as needed for moderate pain.   Vitamin D, Ergocalciferol, (DRISDOL) 1.25 MG (50000 UNIT) CAPS capsule TAKE 1 CAPSULE BY MOUTH EVERY 7 DAYS   [DISCONTINUED] acetaminophen (TYLENOL) 500 MG tablet Take 2 tablets (1,000 mg total) by mouth every 6 (six) hours as needed for mild  pain.   [DISCONTINUED] allopurinol (ZYLOPRIM) 300 MG tablet Take 0.5 tablets (150 mg total) by mouth daily. **PLEASE CONTACT OUR OFFICE TO SCHEDULE A FOLLOW UP FOR FUTURE MED REFILLS**   [DISCONTINUED] FLUoxetine (PROZAC) 40 MG capsule TAKE 1 CAPSULE BY MOUTH DAILY   [DISCONTINUED] liraglutide (VICTOZA) 18 MG/3ML SOPN INJECT 1.2 MGS SUBCUTANEOUSLY ONCE DAILY   [DISCONTINUED] pantoprazole (PROTONIX) 40 MG tablet Take 1 tablet (40 mg total) by mouth daily. **PLEASE CONTACT OUR OFFICE TO SCHEDULE A FOLLOW UP FOR FUTURE MED REFILLS**   No facility-administered encounter medications on file as of 10/21/2020.    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   LIMA to LAD, SVG to D1 and SVG to OM2     Hyperlipidemia    Hypertension    Insomnia    Obstructive sleep apnea    OSA (obstructive sleep apnea)    Past Surgical History:  Procedure Laterality Date   CARDIAC CATHETERIZATION     CARPAL TUNNEL RELEASE  09/10/2015   RIGHT WRIST / WITH NERVE IMPINGEMENT SURGERY   CERVICAL FUSION     Dr Sherwood Gambler   COLONOSCOPY  2007   negative; [facility]   CORONARY ANGIOGRAPHY N/A 10/25/2018   Procedure: CORONARY ANGIOGRAPHY (CATH LAB);  Surgeon: Sherren Mocha, MD;  Location: [facility];  Service: Cardiovascular;  Laterality: N/A;   CORONARY ARTERY BYPASS GRAFT N/A 10/26/2018   Procedure: CORONARY ARTERY BYPASS GRAFTING (CABG) x3 using the LIMA to LAD and right greater saphenous vein via endoscopic harvest to the Diag 1 and OM 2.;  Surgeon: Lajuana Matte, MD;  Location: [facility];  Service: Open Heart Surgery;  Laterality: N/A;   ESOPHAGEAL DILATION  2007   FINGER AMPUTATION  2003   Left Index finger amputation & reattachment   LUMBAR FUSION      Dr Sherwood Gambler   UMBILICAL HERNIA REPAIR N/A 04/03/2020   Procedure: HERNIA REPAIR UMBILICAL ADULT, open;  Surgeon: Olean Ree, MD;  Location: [facility];  Service: General;  Laterality: N/A;   Family History  Problem Relation Age of Onset   Aneurysm Mother 68       CNS aneurysm   Sudden death Mother    Heart disease Father        pacer   Urolithiasis Father    Heart attack Father    Diabetes Sister    Heart disease Sister     Heart disease Sister    Kidney disease Brother    Cancer Maternal Uncle        ? primary   Cancer Paternal Uncle        ? primary   Hyperlipidemia Neg Hx    Hypertension Neg Hx    Colon cancer Neg Hx    Colon polyps Neg Hx    Esophageal cancer Neg Hx    Rectal cancer Neg Hx  Stomach cancer Neg Hx    Social History   Socioeconomic History   Marital status: Married    Spouse name: Not on file   Number of children: Not on file   Years of education: Not on file   Highest education level: Not on file  Occupational History   Occupation: Disabled  Tobacco Use   Smoking status: Former    Packs/day: 1.50    Years: 4.00    Pack years: 6.00    Types: Cigarettes    Quit date: 01/06/1976    Years since quitting: 44.8   Smokeless tobacco: Never   Tobacco comments:    smoked 35 years ago as of 2013   Vaping Use   Vaping Use: Never used  Substance and Sexual Activity   Alcohol use: No   Drug use: No   Sexual activity: Yes    Birth control/protection: None  Other Topics Concern   Not on file  Social History Narrative   Not on file   Social Determinants of Health   Financial Resource Strain: Not on file  Food Insecurity: Not on file  Transportation Needs: Not on file  Physical Activity: Not on file  Stress: Not on file  Social Connections: Not on file    Tobacco Counseling Counseling given: Not Answered Tobacco comments: smoked 35 years ago as of 2013     Diabetic?yes         Activities of Daily Living In your present state of health, do you have any difficulty performing the following activities: 10/21/2020 05/21/2020  Hearing? Tempie Donning  Vision? Y Y  Difficulty concentrating or making decisions? N N  Walking or climbing stairs? N N  Dressing or bathing? N N  Doing errands, shopping? N N  Some recent data might be hidden    Patient Care Team: Lorrene Reid, PA-C as PCP - General Belva Crome, MD as PCP - Cardiology (Cardiology) Rutherford Guys, MD as  Consulting Physician (Ophthalmology) Jovita Gamma, MD as Consulting Physician (Neurosurgery) Kathie Rhodes, MD (Inactive) as Consulting Physician (Urology) Milus Banister, MD as Attending Physician (Gastroenterology) Chesley Mires, MD as Consulting Physician (Pulmonary Disease) Garald Balding, MD as Consulting Physician (Orthopedic Surgery) Dene Gentry, MD as Consulting Physician (Sports Medicine) Corliss Parish, MD as Consulting Physician (Nephrology) Lajuana Matte, MD as Consulting Physician (Cardiothoracic Surgery) Burtis Junes, NP (Inactive) as Nurse Practitioner (Nurse Practitioner) Belva Crome, MD as Consulting Physician (Cardiology)  Indicate any recent [place] you may have received from other than [facility] providers in the past year (date may be approximate).     Assessment:   This is a routine wellness examination for [place].  Hearing/Vision screen No results found.  Dietary issues and exercise activities discussed:  -Low fat and carbohydrate diet. Continue to stay as active as possible and as tolerated. Recommend ambulatory glucose monitoring.   Goals Addressed   None   Depression Screen PHQ 2/9 Scores 10/21/2020 05/21/2020 04/01/2020 03/20/2020 12/07/2019 06/01/2019 04/03/2019  PHQ - 2 Score 1 0 0 0 0 0 0  PHQ- 9 Score 2 1 0 _0 0    Fall Risk Fall Risk  10/21/2020 05/21/2020 04/17/2020 04/01/2020 03/25/2020  Falls in the past year? 1 0 0 0 0  Number falls in past yr: 1 0 - - -  Injury with Fall? 0 0 - - -  Risk for fall due to : No Fall Risks;History of fall(s);Impaired balance/gait No Fall Risks - - -  Risk for fall due to: Comment - - - - -  Follow up Falls evaluation completed Falls evaluation completed - Falls evaluation completed -    FALL RISK PREVENTION PERTAINING TO THE HOME:  Any stairs in or around the home? Yes  If so, are there any without handrails? No  Home free of loose throw rugs in walkways, pet beds,  electrical cords, etc? Yes  Adequate lighting in your home to reduce risk of falls? Yes   ASSISTIVE DEVICES UTILIZED TO PREVENT FALLS:  Life alert? No  Use of a cane, walker or w/c? No  Grab bars in the bathroom? No  Shower chair or bench in shower? No  Elevated toilet seat or a handicapped toilet? Yes   TIMED UP AND GO:  Was the test performed? Yes .  Length of time to ambulate 10 feet: 10 sec.   Gait steady and fast without use of assistive device  Cognitive Function: wnl's   6CIT Screen 10/21/2020  What Year? 0 points  What month? 0 points  What time? 0 points  Count back from 20 0 points  Months in reverse 2 points  Repeat phrase 4 points  Total Score 6    Immunizations Immunization History  Administered Date(s) Administered   Influenza Split 12/10/2010   Influenza,inj,Quad PF,6+ Mos 10/19/2012, 11/23/2014, 10/22/2015, 11/10/2016, 11/08/2017   Influenza,inj,quad, With Preservative 11/10/2016   Influenza-Unspecified 09/25/2018   PFIZER(Purple Top)SARS-COV-2 Vaccination 03/08/2019, 04/08/2019, 12/12/2019   Pneumococcal Conjugate-13 12/07/2014   Pneumococcal Polysaccharide-23 04/17/2016   Td 08/02/2008   Tdap 05/21/2020    TDAP status: Up to date  Flu Vaccine status: Due, Education has been provided regarding the importance of this vaccine. Advised may receive this vaccine at local pharmacy or Health Dept. Aware to provide a copy of the vaccination record if obtained from local pharmacy or Health Dept. Verbalized acceptance and understanding.  Pneumococcal vaccine status: Up to date  Covid-19 vaccine status: Completed vaccines  Qualifies for Shingles Vaccine? Yes   Zostavax completed No   Shingrix Completed?: No.    Education has been provided regarding the importance of this vaccine. Patient has been advised to call insurance company to determine out of pocket expense if they have not yet received this vaccine. Advised may also receive vaccine at local pharmacy  or Health Dept. Verbalized acceptance and understanding.  Screening Tests Health Maintenance  Topic Date Due   Zoster Vaccines- Shingrix (1 of 2) Never done   OPHTHALMOLOGY EXAM  10/06/2018   COVID-19 Vaccine (4 - Booster for Pfizer series) 03/05/2020   INFLUENZA VACCINE  08/05/2020   COLONOSCOPY (Pts 45-29yrs Insurance coverage will need to be confirmed)  10/10/2020   HEMOGLOBIN A1C  04/21/2021   FOOT EXAM  05/27/2021   TETANUS/TDAP  05/22/2030   Hepatitis C Screening  Completed   HIV Screening  Completed   HPV VACCINES  Aged Out    Health Maintenance  Health Maintenance Due  Topic Date Due   Zoster Vaccines- Shingrix (1 of 2) Never done   OPHTHALMOLOGY EXAM  10/06/2018   COVID-19 Vaccine (4 - Booster for [place] series) 03/05/2020   INFLUENZA VACCINE  08/05/2020   COLONOSCOPY (Pts 45-22yrs Insurance coverage will need to be confirmed)  10/10/2020    Colorectal cancer screening: Type of screening: Colonoscopy. Completed 10/11/2015. Repeat every 5 years Patient is scheduled for repeat colonoscopy 10/22/2020  Lung Cancer Screening: (Low Dose CT Chest recommended if Age 82-80 years, 30 pack-year currently smoking OR have quit w/in 15years.)  does not qualify.   Lung Cancer Screening Referral: n/a  Additional Screening:  Hepatitis C Screening: does qualify; Completed 05/21/2020  Vision Screening: Recommended annual ophthalmology exams for early detection of glaucoma and other disorders of the eye. Is the patient up to date with their annual eye exam?  Yes  Who is the provider or what is the name of the office in which the patient attends annual eye exams? [facility]  If pt is not established with a provider, would they like to be referred to a provider to establish care? No .   Dental Screening: Recommended annual dental exams for proper oral hygiene  Community Resource Referral / Chronic Care Management: CRR required this visit?  No   CCM required this visit?  No       Plan:  -Patient reports has been not been taking Victoza and Jardiance because is not able to afford medications, is currently in the medicare coverage gap. A1c today 7.5, which has increased from prior. Provided Ozempic samples, discontinued Victoza. Discussed potential side effects. Advised to let me know if unable to tolerate medication. -Provided medication refills. -Continue to follow up with various specialists. -Pt declined AAA screening. -Follow up in 3 months for DM, HTN, HLD and FBW   I have personally reviewed and noted the following in the patient's chart:   Medical and social history Use of alcohol, tobacco or illicit drugs  Current medications and supplements including opioid prescriptions. Patient is currently taking opioid prescriptions. Information provided to patient regarding non-opioid alternatives. Patient advised to discuss non-opioid treatment plan with their provider. Functional ability and status Nutritional status Physical activity Advanced directives List of other physicians Hospitalizations, surgeries, and ER visits in previous 12 months Vitals Screenings to include cognitive, depression, and falls Referrals and appointments  In addition, I have reviewed and discussed with patient certain preventive protocols, quality metrics, and best practice recommendations. A written personalized care plan for preventive services as well as general preventive health recommendations were provided to patient.     Lorrene Reid, PA-C   10/21/2020

## 2020-10-22 ENCOUNTER — Ambulatory Visit (AMBULATORY_SURGERY_CENTER): Payer: Medicare Other | Admitting: Gastroenterology

## 2020-10-22 ENCOUNTER — Encounter: Payer: Self-pay | Admitting: Gastroenterology

## 2020-10-22 VITALS — BP 114/74 | HR 59 | Temp 97.5°F | Resp 20 | Ht 71.0 in | Wt 235.0 lb

## 2020-10-22 DIAGNOSIS — D123 Benign neoplasm of transverse colon: Secondary | ICD-10-CM | POA: Diagnosis not present

## 2020-10-22 DIAGNOSIS — D124 Benign neoplasm of descending colon: Secondary | ICD-10-CM

## 2020-10-22 DIAGNOSIS — E119 Type 2 diabetes mellitus without complications: Secondary | ICD-10-CM | POA: Diagnosis not present

## 2020-10-22 DIAGNOSIS — Z86010 Personal history of colonic polyps: Secondary | ICD-10-CM | POA: Diagnosis not present

## 2020-10-22 MED ORDER — SODIUM CHLORIDE 0.9 % IV SOLN: 500.0000 mL | INTRAVENOUS | Status: DC

## 2020-10-22 NOTE — Progress Notes (Signed)
Report to PACU, RN, vss, BBS= Clear.  

## 2020-10-22 NOTE — Progress Notes (Signed)
Called to room to assist during endoscopic procedure.  Patient ID and intended procedure confirmed with present staff. Received instructions for my participation in the procedure from the performing physician.  

## 2020-10-22 NOTE — Patient Instructions (Signed)
YOU HAD AN ENDOSCOPIC PROCEDURE TODAY AT [facility]:   Refer to the procedure report that was given to you for any specific questions about what was found during the examination.  If the procedure report does not answer your questions, please call your gastroenterologist to clarify.  If you requested that your care partner not be given the details of your procedure findings, then the procedure report has been included in a sealed envelope for you to review at your convenience later.  YOU SHOULD EXPECT: Some feelings of bloating in the abdomen. Passage of more gas than usual.  Walking can help get rid of the air that was put into your GI tract during the procedure and reduce the bloating. If you had a lower endoscopy (such as a colonoscopy or flexible sigmoidoscopy) you may notice spotting of blood in your stool or on the toilet paper. If you underwent a bowel prep for your procedure, you may not have a normal bowel movement for a few days.  Please Note:  You might notice some irritation and congestion in your nose or some drainage.  This is from the oxygen used during your procedure.  There is no need for concern and it should clear up in a day or so.  **Handouts given on polyps and diverticulosis**  SYMPTOMS TO REPORT IMMEDIATELY:  Following lower endoscopy (colonoscopy or flexible sigmoidoscopy):  Excessive amounts of blood in the stool  Significant tenderness or worsening of abdominal pains  Swelling of the abdomen that is new, acute  Fever of 100F or higher  For urgent or emergent issues, a gastroenterologist can be reached at any hour by calling [phone]. Do not use MyChart messaging for urgent concerns.    DIET:  We do recommend a small meal at first, but then you may proceed to your regular diet.  Drink plenty of fluids but you should avoid alcoholic beverages for 24 hours.  ACTIVITY:  You should plan to take it easy for the rest of today and you should NOT DRIVE  or use heavy machinery until tomorrow (because of the sedation medicines used during the test).    FOLLOW UP: Our staff will call the number listed on your records 48-72 hours following your procedure to check on you and address any questions or concerns that you may have regarding the information given to you following your procedure. If we do not reach you, we will leave a message.  We will attempt to reach you two times.  During this call, we will ask if you have developed any symptoms of COVID 19. If you develop any symptoms (ie: fever, flu-like symptoms, shortness of breath, cough etc.) before then, please call [phone].  If you test positive for Covid 19 in the 2 weeks post procedure, please call and report this information to [place].    If any biopsies were taken you will be contacted by phone or by letter within the next 1-3 weeks.  Please call us at [phone] if you have not heard about the biopsies in 3 weeks.    SIGNATURES/CONFIDENTIALITY: You and/or your care partner have signed paperwork which will be entered into your electronic medical record.  These signatures attest to the fact that that the information above on your After Visit Summary has been reviewed and is understood.  Full responsibility of the confidentiality of this discharge information lies with you and/or your care-partner.

## 2020-10-22 NOTE — Progress Notes (Signed)
HPI: This is a man with h/o polyhlp   ROS: complete GI ROS as described in HPI, all other review negative.  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   LIMA to LAD, SVG to D1 and SVG to OM2     Hyperlipidemia    Hypertension    Insomnia    Obstructive sleep apnea    OSA (obstructive sleep apnea)     Past Surgical History:  Procedure Laterality Date   CARDIAC CATHETERIZATION     CARPAL TUNNEL RELEASE  09/10/2015   RIGHT WRIST / WITH NERVE IMPINGEMENT SURGERY   CERVICAL FUSION     Dr Sherwood Gambler   COLONOSCOPY  2007   negative; [facility]   CORONARY ANGIOGRAPHY N/A 10/25/2018   Procedure: CORONARY ANGIOGRAPHY (CATH LAB);  Surgeon: Sherren Mocha, MD;  Location: [facility];  Service: Cardiovascular;  Laterality: N/A;   CORONARY ARTERY BYPASS GRAFT N/A 10/26/2018   Procedure: CORONARY ARTERY BYPASS GRAFTING (CABG) x3 using the LIMA to LAD and right greater saphenous vein via endoscopic harvest to the Diag 1 and OM 2.;  Surgeon: Lajuana Matte, MD;  Location: [facility];  Service: Open Heart Surgery;  Laterality: N/A;   ESOPHAGEAL DILATION  2007   FINGER AMPUTATION  2003   Left Index finger amputation & reattachment   LUMBAR FUSION      Dr Sherwood Gambler   UMBILICAL HERNIA REPAIR N/A 04/03/2020   Procedure: HERNIA REPAIR UMBILICAL ADULT, open;  Surgeon: Olean Ree, MD;  Location: [facility];  Service: General;  Laterality: N/A;    Current Outpatient Medications  Medication Sig Dispense Refill    allopurinol (ZYLOPRIM) 300 MG tablet Take 0.5 tablets (150 mg total) by mouth daily. 45 tablet 0   Ascorbic Acid (VITAMIN C) 1000 MG tablet Take 1,000 mg by mouth daily.     aspirin 81 MG tablet Take 81 mg by mouth daily.     atorvastatin (LIPITOR) 80 MG tablet TAKE 1 TABLET BY MOUTH EACH NIGHT AT BEDTIME 90 tablet 2   benazepril (LOTENSIN) 10 MG tablet TAKE 1 TABLET BY MOUTH DAILY (Patient taking differently: Take 10 mg by mouth daily.) 90 tablet 3   blood glucose meter kit and supplies Dispense based on patient and insurance preference. Use to check fasting blood glucose in the morning and to check glucose 2 hours after largest meal of the day. (FOR ICD-10 E10.9, E11.9). 1 each 0   FLUoxetine (PROZAC) 40 MG capsule Take 1 capsule (40 mg total) by mouth daily. 90 capsule 0   gabapentin (NEURONTIN) 800 MG tablet TAKE 1 TABLET BY MOUTH THREE TIMES DAILY 90 tablet 0   glucose blood test strip Use as instructed 100 each 12   Insulin Pen Needle (PEN NEEDLES) 31G X 8 MM MISC 1 each by Does not apply route daily. 90 each 0   metoprolol succinate (  TOPROL-XL) 25 MG 24 hr tablet Take 1 tablet (25 mg total) by mouth daily. 90 tablet 3   Multiple Vitamins-Iron (MULTIVITAMINS WITH IRON) TABS Take 1 tablet by mouth daily. 30 tablet 0   polyethylene glycol powder (GLYCOLAX/MIRALAX) powder USE AS DIRECTED (Patient taking differently: Take 17 g by mouth daily as needed for moderate constipation. USE AS DIRECTED) 850 g 11   pyridoxine (B-6) 100 MG tablet Take 100 mg by mouth daily.     Vitamin D, Ergocalciferol, (DRISDOL) 1.25 MG (50000 UNIT) CAPS capsule TAKE 1 CAPSULE BY MOUTH EVERY 7 DAYS 4 capsule 0   acetaminophen (TYLENOL) 500 MG tablet Take 2 tablets (1,000 mg total) by mouth every 6 (six) hours as needed. 30 tablet 0   JARDIANCE 10 MG TABS tablet TAKE 1 TABLET BY MOUTH DAILY BEFORE BREAKFAST (Patient not taking: Reported on 10/22/2020) 30 tablet 7   LANCETS ULTRA FINE MISC Use to check fasting blood glucose  in the morning and to check glucose 2 hours after largest meal of the day 100 each 12   pantoprazole (PROTONIX) 40 MG tablet Take 1 tablet (40 mg total) by mouth daily. (Patient not taking: Reported on 10/22/2020) 90 tablet 0   Semaglutide,0.25 or 0.5MG/DOS, (OZEMPIC, 0.25 OR 0.5 MG/DOSE,) 2 MG/1.5ML SOPN Inject 0.25 mg into the skin once weekly x 4 weeks. Then inject 0.5 mg into the skin once weekly. (Patient not taking: Reported on 10/22/2020) 1.5 mL 3   traMADol (ULTRAM) 50 MG tablet Take 1-2 tablets (50-100 mg total) by mouth every 4 (four) hours as needed for moderate pain. 30 tablet 0   Current Facility-Administered Medications  Medication Dose Route Frequency Provider Last Rate Last Admin   0.9 %  sodium chloride infusion  500 mL Intravenous Continuous Milus Banister, MD        Allergies as of 10/22/2020 - Review Complete 10/22/2020  Allergen Reaction Noted   Prednisone Other (See Comments) 12/23/2012    Family History  Problem Relation Age of Onset   Aneurysm Mother 70       CNS aneurysm   Sudden death Mother    Heart disease Father        pacer   Urolithiasis Father    Heart attack Father    Diabetes Sister    Heart disease Sister    Heart disease Sister    Kidney disease Brother    Cancer Maternal Uncle        ? primary   Cancer Paternal Uncle        ? primary   Hyperlipidemia Neg Hx    Hypertension Neg Hx    Colon cancer Neg Hx    Colon polyps Neg Hx    Esophageal cancer Neg Hx    Rectal cancer Neg Hx    Stomach cancer Neg Hx     Social History   Socioeconomic History   Marital status: Married    Spouse name: Not on file   Number of children: Not on file   Years of education: Not on file   Highest education level: Not on file  Occupational History   Occupation: Disabled  Tobacco Use   Smoking status: Former    Packs/day: 1.50    Years: 4.00    Pack years: 6.00    Types: Cigarettes    Quit date: 01/06/1976    Years since quitting: 44.8   Smokeless  tobacco: Never   Tobacco comments:    smoked 35 years ago as of  2013   Vaping Use   Vaping Use: Never used  Substance and Sexual Activity   Alcohol use: No   Drug use: No   Sexual activity: Yes    Birth control/protection: None  Other Topics Concern   Not on file  Social History Narrative   Not on file   Social Determinants of Health   Financial Resource Strain: Not on file  Food Insecurity: Not on file  Transportation Needs: Not on file  Physical Activity: Not on file  Stress: Not on file  Social Connections: Not on file  Intimate Partner Violence: Not on file     Physical Exam: BP (!) 137/48   Pulse 68   Temp (!) 97.5 F (36.4 C) (Temporal)   Ht _0  (1.803 m)   Wt 235 lb (106.6 kg)   SpO2 98%   BMI 32.78 kg/m  Constitutional: generally well-appearing Psychiatric: alert and oriented x3 Lungs: CTA bilaterally Heart: no MCR  Assessment and plan: 65 y.o. male with h/o poltyps  Colonoscopy today  Care is appropriate for the ambulatory setting.  Owens Loffler, MD [facility] 10/22/2020, 8:11 AM

## 2020-10-22 NOTE — Progress Notes (Signed)
Pt's states no medical or surgical changes since previsit or office visit. 

## 2020-10-22 NOTE — Op Note (Signed)
[facility] Patient Name: Dailyn Kempner Procedure Date: 10/22/2020 8:12 AM MRN: [id number] Endoscopist: Milus Banister , MD Age: 65 Referring MD:  Date of Birth: 07/27/1955 Gender: Male Account #: [id number] Procedure:                Colonoscopy Indications:              High risk colon cancer surveillance: Personal                            history of colonic polyps; Colonoscopy 2017 single                            subCM adenoma removed Medicines:                Monitored Anesthesia Care Procedure:                Pre-Anesthesia Assessment:                           - Prior to the procedure, a History and Physical                            was performed, and patient medications and                            allergies were reviewed. The patient's tolerance of                            previous anesthesia was also reviewed. The risks                            and benefits of the procedure and the sedation                            options and risks were discussed with the patient.                            All questions were answered, and informed consent                            was obtained. Prior Anticoagulants: The patient has                            taken no previous anticoagulant or antiplatelet                            agents. ASA Grade Assessment: II - A patient with                            mild systemic disease. After reviewing the risks                            and benefits, the patient was deemed in  satisfactory condition to undergo the procedure.                           After obtaining informed consent, the colonoscope                            was passed under direct vision. Throughout the                            procedure, the patient's blood pressure, pulse, and                            oxygen saturations were monitored continuously. The                            Olympus CF-HQ190L (Serial# [id number])  Colonoscope was                            introduced through the anus and advanced to the the                            cecum, identified by appendiceal orifice and                            ileocecal valve. The colonoscopy was performed                            without difficulty. The patient tolerated the                            procedure well. Scope In: 8:16:37 AM Scope Out: 8:30:49 AM Scope Withdrawal Time: 0 hours 10 minutes 4 seconds  Total Procedure Duration: 0 hours 14 minutes 12 seconds  Findings:                 A 4 mm polyp was found in the transverse colon. The                            polyp was sessile. The polyp was removed with a                            cold snare. Resection and retrieval were complete.                           A 7 mm polyp was found in the descending colon. The                            polyp was pedunculated. The polyp was removed with                            a hot snare. Resection and retrieval were complete.                           Multiple small and large-mouthed  diverticula were                            found in the left colon.                           The exam was otherwise without abnormality on                            direct and retroflexion views. Complications:            No immediate complications. Estimated blood loss:                            None. Estimated Blood Loss:     Estimated blood loss: none. Impression:               - One 4 mm polyp in the transverse colon, removed                            with a cold snare. Resected and retrieved.                           - One 7 mm polyp in the descending colon, removed                            with a hot snare. Resected and retrieved.                           - Diverticulosis in the left colon.                           - The examination was otherwise normal on direct                            and retroflexion views. Recommendation:           - Patient has a  contact number available for                            emergencies. The signs and symptoms of potential                            delayed complications were discussed with the                            patient. Return to normal activities tomorrow.                            Written discharge instructions were provided to the                            patient.                           - Resume previous diet.                           -  Continue present medications.                           - Await pathology results. Milus Banister, MD 10/22/2020 8:33:28 AM This report has been signed electronically.

## 2020-10-24 ENCOUNTER — Telehealth: Payer: Self-pay

## 2020-10-24 NOTE — Telephone Encounter (Signed)
  Follow up Call-  Call back number 10/22/2020  Post procedure Call Back phone  # [phone]  Permission to leave phone message Yes  Some recent data might be hidden     Patient questions:  Do you have a fever, pain , or abdominal swelling? No. Pain Score  0 *  Have you tolerated food without any problems? Yes.    Have you been able to return to your normal activities? Yes.    Do you have any questions about your discharge instructions: Diet   No. Medications  No. Follow up visit  No.  Do you have questions or concerns about your Care? No.  Actions: * If pain score is 4 or above: No action needed, pain <4.  Have you developed a fever since your procedure? no  2.   Have you had an respiratory symptoms (SOB or cough) since your procedure? no  3.   Have you tested positive for COVID 19 since your procedure no  4.   Have you had any family members/close contacts diagnosed with the COVID 19 since your procedure?  no   If yes to any of these questions please route to Joylene John, RN and Joella Prince, RN

## 2020-10-25 ENCOUNTER — Encounter: Payer: Self-pay | Admitting: Gastroenterology

## 2020-10-28 ENCOUNTER — Other Ambulatory Visit: Payer: Self-pay | Admitting: Physician Assistant

## 2020-10-28 DIAGNOSIS — K819 Cholecystitis, unspecified: Secondary | ICD-10-CM

## 2020-10-28 DIAGNOSIS — E559 Vitamin D deficiency, unspecified: Secondary | ICD-10-CM

## 2020-11-04 DIAGNOSIS — I129 Hypertensive chronic kidney disease with stage 1 through stage 4 chronic kidney disease, or unspecified chronic kidney disease: Secondary | ICD-10-CM | POA: Diagnosis not present

## 2020-11-04 DIAGNOSIS — N183 Chronic kidney disease, stage 3 unspecified: Secondary | ICD-10-CM | POA: Diagnosis not present

## 2020-11-04 DIAGNOSIS — E1122 Type 2 diabetes mellitus with diabetic chronic kidney disease: Secondary | ICD-10-CM | POA: Diagnosis not present

## 2020-11-04 DIAGNOSIS — E875 Hyperkalemia: Secondary | ICD-10-CM | POA: Diagnosis not present

## 2020-11-11 DIAGNOSIS — N5201 Erectile dysfunction due to arterial insufficiency: Secondary | ICD-10-CM | POA: Diagnosis not present

## 2020-11-12 ENCOUNTER — Other Ambulatory Visit: Payer: Self-pay | Admitting: Physician Assistant

## 2020-11-12 NOTE — Progress Notes (Signed)
Cardiology Office Note:    Date:  11/13/2020   ID:  [name], DOB 1955-05-02, MRN [id number]  PCP:  Lorrene Reid, PA-C  Cardiologist:  Sinclair Grooms, MD   Referring MD: Lorrene Reid, PA-C   Chief Complaint  Patient presents with   Coronary Artery Disease   Hypertension   Hyperlipidemia     History of Present Illness:    [name] is a 65 y.o. male with a hx of CABG for severe LM disease ( LIMA to LAD, SVG to DX1 and OM 10/26/18), DM, HTN, OSA, GERD and HLD.   [name] is temporarily off Jardiance because of the donut hole.  He is not having angina, orthopnea, PND, edema, or syncope.  He works as a Games developer.  He gets more than 30 minutes of moderate activity per week.  He denies claudication.  He has not had palpitations, or neurological complaints.  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   LIMA to LAD, SVG to D1 and SVG to OM2     Hyperlipidemia    Hypertension    Insomnia    Obstructive sleep apnea    OSA (obstructive sleep apnea)     Past Surgical History:  Procedure Laterality Date   CARDIAC CATHETERIZATION     CARPAL TUNNEL RELEASE  09/10/2015   RIGHT WRIST / WITH NERVE IMPINGEMENT SURGERY   CERVICAL FUSION     Dr Sherwood Gambler   COLONOSCOPY  2007   negative; [facility]   CORONARY ANGIOGRAPHY N/A 10/25/2018   Procedure: CORONARY ANGIOGRAPHY (CATH LAB);  Surgeon: Sherren Mocha, MD;  Location: [facility];  Service: Cardiovascular;  Laterality: N/A;   CORONARY ARTERY BYPASS GRAFT N/A 10/26/2018    Procedure: CORONARY ARTERY BYPASS GRAFTING (CABG) x3 using the LIMA to LAD and right greater saphenous vein via endoscopic harvest to the Diag 1 and OM 2.;  Surgeon: Lajuana Matte, MD;  Location: [facility];  Service: Open Heart Surgery;  Laterality: N/A;   ESOPHAGEAL DILATION  2007   FINGER AMPUTATION  2003   Left Index finger amputation & reattachment   LUMBAR FUSION      Dr Sherwood Gambler   UMBILICAL HERNIA REPAIR N/A 04/03/2020   Procedure: HERNIA REPAIR UMBILICAL ADULT, open;  Surgeon: Olean Ree, MD;  Location: [facility];  Service: General;  Laterality: N/A;    Current Medications: No outpatient medications have been marked as taking for the 11/13/20 encounter (Office Visit) with Belva Crome, MD.     Allergies:   Prednisone   Social History   Socioeconomic History   Marital status: Married    Spouse name: Not on file   Number of children: Not on file   Years of education: Not on file   Highest education level: Not on file  Occupational History   Occupation: Disabled  Tobacco Use   Smoking status: Former    Packs/day: 1.50  Years: 4.00    Pack years: 6.00    Types: Cigarettes    Quit date: 01/06/1976    Years since quitting: 44.8   Smokeless tobacco: Never   Tobacco comments:    smoked 35 years ago as of 2013   Vaping Use   Vaping Use: Never used  Substance and Sexual Activity   Alcohol use: No   Drug use: No   Sexual activity: Yes    Birth control/protection: None  Other Topics Concern   Not on file  Social History Narrative   Not on file   Social Determinants of Health   Financial Resource Strain: Not on file  Food Insecurity: Not on file  Transportation Needs: Not on file  Physical Activity: Not on file  Stress: Not on file  Social Connections: Not on file     Family History: The patient's family history includes Aneurysm (age of onset: 66) in his mother; Cancer in his maternal uncle and paternal uncle; Diabetes in his sister; Heart attack in his  father; Heart disease in his father, sister, and sister; Kidney disease in his brother; Sudden death in his mother; Urolithiasis in his father. There is no history of Hyperlipidemia, Hypertension, Colon cancer, Colon polyps, Esophageal cancer, Rectal cancer, or Stomach cancer.  ROS:   Please see the history of present illness.    Donut hole relative to some of his medications. All other systems reviewed and are negative.  EKGs/Labs/Other Studies Reviewed:    The following studies were reviewed today: No new data  EKG:  EKG sinus rhythm, nonspecific T wave flattening.  PR interval is normal.  There is precordial T wave inversion V1 and V2.  There is no change when compared to the prior tracing.  Prior tracing was performed 12/08/2019.  Recent Labs: 04/23/2020: ALT 24; BUN 26; Creatinine, Ser 1.79; Hemoglobin 14.2; Platelets 321; Potassium 4.9; Sodium 142; TSH 2.920  Recent Lipid Panel    Component Value Date/Time   CHOL 145 04/23/2020 0911   TRIG 137 04/23/2020 0911   HDL 43 04/23/2020 0911   CHOLHDL 3.4 04/23/2020 0911   CHOLHDL 3.4 10/25/2018 0320   VLDL 21 10/25/2018 0320   LDLCALC 78 04/23/2020 0911    Physical Exam:    VS:  BP 110/70   Pulse 69   Ht 5\' 11"  (1.803 m)   Wt 233 lb (105.7 kg)   SpO2 92%   BMI 32.50 kg/m     Wt Readings from Last 3 Encounters:  11/13/20 233 lb (105.7 kg)  10/22/20 235 lb (106.6 kg)  10/21/20 238 lb (108 kg)     GEN: Obese with BMI 32.5. No acute distress HEENT: Normal NECK: No JVD. LYMPHATICS: No lymphadenopathy CARDIAC: No murmur. RRR no gallop, or edema. VASCULAR: Normal Pulses. No bruits. RESPIRATORY:  Clear to auscultation without rales, wheezing or rhonchi  ABDOMEN: Soft, non-tender, non-distended, No pulsatile mass, MUSCULOSKELETAL: No deformity  SKIN: Warm and dry NEUROLOGIC:  Alert and oriented x 3 PSYCHIATRIC:  Normal affect   ASSESSMENT:    1. S/P CABG (coronary artery bypass graft)   2. Essential hypertension   3.  Pure hypercholesterolemia   4. 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 Chronic renal impairment, stage 3b (HCC)    PLAN:    In order of problems listed above:  Secondary prevention discussed. Blood pressures under excellent control on Lotensin 10 mg daily, Toprol-XL 25 mg daily, and low-salt  diet. Continue Lipitor 80 mg/day. Once out of the donut hole resume Jardiance, continue insulin, and Ozempic.  A1c target less than 7. Most recent creatinine 0.79 in April 2022 with estimated GFR 42.   Overall education and awareness concerning secondary risk prevention was discussed in detail: LDL less than 70, hemoglobin A1c less than 7, blood pressure target less than 130/80 mmHg, >150 minutes of moderate aerobic activity per week, avoidance of smoking, weight control (via diet and exercise), and continued surveillance/management of/for obstructive sleep apnea.   Medication Adjustments/Labs and Tests Ordered: Current medicines are reviewed at length with the patient today.  Concerns regarding medicines are outlined above.  No orders of the defined types were placed in this encounter.  No orders of the defined types were placed in this encounter.   There are no Patient Instructions on file for this visit.   Signed, Sinclair Grooms, MD  11/13/2020 12:20 PM    [place]

## 2020-11-13 ENCOUNTER — Other Ambulatory Visit: Payer: Self-pay

## 2020-11-13 ENCOUNTER — Encounter: Payer: Self-pay | Admitting: Interventional Cardiology

## 2020-11-13 ENCOUNTER — Ambulatory Visit: Payer: Medicare Other | Admitting: Interventional Cardiology

## 2020-11-13 VITALS — BP 110/70 | HR 69 | Ht 71.0 in | Wt 233.0 lb

## 2020-11-13 DIAGNOSIS — Z951 Presence of aortocoronary bypass graft: Secondary | ICD-10-CM

## 2020-11-13 DIAGNOSIS — I10 Essential (primary) hypertension: Secondary | ICD-10-CM | POA: Diagnosis not present

## 2020-11-13 DIAGNOSIS — N1832 Chronic kidney disease, stage 3b: Secondary | ICD-10-CM

## 2020-11-13 DIAGNOSIS — E7800 Pure hypercholesterolemia, unspecified: Secondary | ICD-10-CM

## 2020-11-13 DIAGNOSIS — E1129 Type 2 diabetes mellitus with other diabetic kidney complication: Secondary | ICD-10-CM

## 2020-11-13 NOTE — Patient Instructions (Signed)

## 2020-11-15 NOTE — Addendum Note (Signed)
Addended by: Maren Beach, Mechille Varghese A on: 11/15/2020 02:23 PM   Modules accepted: Orders

## 2020-11-25 ENCOUNTER — Other Ambulatory Visit: Payer: Self-pay | Admitting: Physician Assistant

## 2020-11-25 DIAGNOSIS — E559 Vitamin D deficiency, unspecified: Secondary | ICD-10-CM

## 2020-12-26 ENCOUNTER — Other Ambulatory Visit: Payer: Self-pay | Admitting: Physician Assistant

## 2020-12-26 DIAGNOSIS — E559 Vitamin D deficiency, unspecified: Secondary | ICD-10-CM

## 2021-01-02 ENCOUNTER — Other Ambulatory Visit: Payer: Self-pay | Admitting: Physician Assistant

## 2021-01-07 ENCOUNTER — Encounter: Payer: Self-pay | Admitting: Physician Assistant

## 2021-01-07 ENCOUNTER — Other Ambulatory Visit: Payer: Self-pay | Admitting: Interventional Cardiology

## 2021-01-13 ENCOUNTER — Ambulatory Visit: Payer: Medicare Other | Admitting: Interventional Cardiology

## 2021-01-20 ENCOUNTER — Other Ambulatory Visit: Payer: Self-pay | Admitting: Physician Assistant

## 2021-01-20 DIAGNOSIS — E559 Vitamin D deficiency, unspecified: Secondary | ICD-10-CM

## 2021-01-22 ENCOUNTER — Other Ambulatory Visit: Payer: Self-pay

## 2021-01-22 ENCOUNTER — Ambulatory Visit (INDEPENDENT_AMBULATORY_CARE_PROVIDER_SITE_OTHER): Payer: PPO | Admitting: Physician Assistant

## 2021-01-22 ENCOUNTER — Encounter: Payer: Self-pay | Admitting: Physician Assistant

## 2021-01-22 VITALS — BP 99/64 | HR 68 | Temp 97.3°F | Ht 71.0 in | Wt 228.0 lb

## 2021-01-22 DIAGNOSIS — E785 Hyperlipidemia, unspecified: Secondary | ICD-10-CM

## 2021-01-22 DIAGNOSIS — I152 Hypertension secondary to endocrine disorders: Secondary | ICD-10-CM

## 2021-01-22 DIAGNOSIS — M109 Gout, unspecified: Secondary | ICD-10-CM

## 2021-01-22 DIAGNOSIS — E1169 Type 2 diabetes mellitus with other specified complication: Secondary | ICD-10-CM | POA: Diagnosis not present

## 2021-01-22 DIAGNOSIS — E1159 Type 2 diabetes mellitus with other circulatory complications: Secondary | ICD-10-CM

## 2021-01-22 DIAGNOSIS — E1129 Type 2 diabetes mellitus with other diabetic kidney complication: Secondary | ICD-10-CM | POA: Diagnosis not present

## 2021-01-22 LAB — POCT GLYCOSYLATED HEMOGLOBIN (HGB A1C): Hemoglobin A1C: 6.7 % — AB (ref 4.0–5.6)

## 2021-01-22 MED ORDER — OZEMPIC (0.25 OR 0.5 MG/DOSE) 2 MG/1.5ML ~~LOC~~ SOPN: PEN_INJECTOR | SUBCUTANEOUS | 3 refills | Status: DC

## 2021-01-22 MED ORDER — EMPAGLIFLOZIN 10 MG PO TABS: 10.0000 mg | ORAL_TABLET | Freq: Every day | ORAL | 3 refills | Status: DC

## 2021-01-22 NOTE — Assessment & Plan Note (Signed)
-  Controlled. -Continue current medication regimen. -Will continue to monitor. Will collect CMP for medication monitoring.

## 2021-01-22 NOTE — Progress Notes (Signed)
Established Patient Office Visit  Subjective:  Patient ID: [name] Huitron Sr., male    DOB: 08-29-1955  Age: 66 y.o. MRN: [id number]  CC:  Chief Complaint  Patient presents with   Follow-up   Diabetes   Hyperlipidemia   Hypertension    HPI [name] Conger Sr. presents for follow up on diabetes mellitus, hypertension and hyperlipidemia. Patient reports had a circumcision and since has been having issues with ED. Urologist prescribed Viagra but medication too expensive.   Diabetes mellitus: Pt denies increased urination or thirst. Pt reports was out of Ozempic for a short period due to being in the doughnut hole and wasn't able to afford medication. No hypoglycemic events. Checking glucose at home. FBS range from 139-140. States has reduced sweets.   HTN: Pt denies chest pain, palpitations, dizziness or lower extremity swelling. Taking medication as directed without side effects. Does not check BP at home.   HLD: Pt taking medication as directed without issues. Reports has started walking 1 mile every morning and working out. Feels more energized.   Gout: Reports no acute exacerbations. Last flare-up was about  years ago.  Reports medication compliance.    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   LIMA to LAD, SVG to D1 and SVG to OM2     Hyperlipidemia    Hypertension    Insomnia    Obstructive sleep apnea    OSA (obstructive sleep apnea)     Past Surgical History:  Procedure Laterality Date   CARDIAC CATHETERIZATION     CARPAL TUNNEL  RELEASE  09/10/2015   RIGHT WRIST / WITH NERVE IMPINGEMENT SURGERY   CERVICAL FUSION     Dr Sherwood Gambler   COLONOSCOPY  2007   negative;  GI   CORONARY ANGIOGRAPHY N/A 10/25/2018   Procedure: CORONARY ANGIOGRAPHY (CATH LAB);  Surgeon: Sherren Mocha, MD;  Location: [facility];  Service: Cardiovascular;  Laterality: N/A;   CORONARY ARTERY BYPASS GRAFT N/A 10/26/2018   Procedure: CORONARY ARTERY BYPASS GRAFTING (CABG) x3 using the LIMA to LAD and right greater saphenous vein via endoscopic harvest to the Diag 1 and OM 2.;  Surgeon: Lajuana Matte, MD;  Location: [facility];  Service: Open Heart Surgery;  Laterality: N/A;   ESOPHAGEAL DILATION  2007   FINGER AMPUTATION  2003   Left Index finger amputation & reattachment   LUMBAR FUSION      Dr Sherwood Gambler   UMBILICAL HERNIA REPAIR N/A 04/03/2020   Procedure: HERNIA REPAIR UMBILICAL ADULT, open;  Surgeon: Olean Ree, MD;  Location: [facility];  Service: General;  Laterality: N/A;    Family History  Problem Relation Age of Onset   Aneurysm Mother 5       CNS aneurysm   Sudden death Mother    Heart disease Father        pacer   Urolithiasis Father    Heart attack Father  Diabetes Sister    Heart disease Sister    Heart disease Sister    Kidney disease Brother    Cancer Maternal Uncle        ? primary   Cancer Paternal Uncle        ? primary   Hyperlipidemia Neg Hx    Hypertension Neg Hx    Colon cancer Neg Hx    Colon polyps Neg Hx    Esophageal cancer Neg Hx    Rectal cancer Neg Hx    Stomach cancer Neg Hx     Social History   Socioeconomic History   Marital status: Married    Spouse name: Not on file   Number of children: Not on file   Years of education: Not on file   Highest education level: Not on file  Occupational History   Occupation: Disabled  Tobacco Use   Smoking status: Former    Packs/day: 1.50    Years: 4.00    Pack years: 6.00    Types: Cigarettes    Quit date: 01/06/1976    Years  since quitting: 45.0   Smokeless tobacco: Never   Tobacco comments:    smoked 35 years ago as of 2013   Vaping Use   Vaping Use: Never used  Substance and Sexual Activity   Alcohol use: No   Drug use: No   Sexual activity: Yes    Birth control/protection: None  Other Topics Concern   Not on file  Social History Narrative   Not on file   Social Determinants of Health   Financial Resource Strain: Not on file  Food Insecurity: Not on file  Transportation Needs: Not on file  Physical Activity: Not on file  Stress: Not on file  Social Connections: Not on file  Intimate Partner Violence: Not on file    Outpatient Medications Prior to Visit  Medication Sig Dispense Refill   acetaminophen (TYLENOL) 500 MG tablet Take 2 tablets (1,000 mg total) by mouth every 6 (six) hours as needed. 30 tablet 0   allopurinol (ZYLOPRIM) 300 MG tablet Take 0.5 tablets (150 mg total) by mouth daily. 45 tablet 0   Ascorbic Acid (VITAMIN C) 1000 MG tablet Take 1,000 mg by mouth daily.     aspirin 81 MG tablet Take 81 mg by mouth daily.     atorvastatin (LIPITOR) 80 MG tablet TAKE 1 TABLET BY MOUTH EACH NIGHT AT BEDTIME 90 tablet 3   benazepril (LOTENSIN) 10 MG tablet TAKE 1 TABLET BY MOUTH DAILY (Patient taking differently: Take 10 mg by mouth daily.) 90 tablet 3   blood glucose meter kit and supplies Dispense based on patient and insurance preference. Use to check fasting blood glucose in the morning and to check glucose 2 hours after largest meal of the day. (FOR ICD-10 E10.9, E11.9). 1 each 0   FLUoxetine (PROZAC) 40 MG capsule Take 1 capsule (40 mg total) by mouth daily. 90 capsule 0   gabapentin (NEURONTIN) 800 MG tablet TAKE 1 TABLET BY MOUTH THREE TIMES DAILY 90 tablet 0   glucose blood test strip Use as instructed 100 each 12   Insulin Pen Needle (PEN NEEDLES) 31G X 8 MM MISC 1 each by Does not apply route daily. 90 each 0   LANCETS ULTRA FINE MISC Use to check fasting blood glucose in the morning  and to check glucose 2 hours after largest meal of the day 100 each 12   metoprolol succinate (TOPROL-XL) 25 MG 24  hr tablet Take 1 tablet (25 mg total) by mouth daily. 90 tablet 3   Multiple Vitamins-Iron (MULTIVITAMINS WITH IRON) TABS Take 1 tablet by mouth daily. 30 tablet 0   pantoprazole (PROTONIX) 40 MG tablet TAKE 1 TABLET BY MOUTH DAILY 90 tablet 1   polyethylene glycol powder (GLYCOLAX/MIRALAX) powder USE AS DIRECTED (Patient taking differently: Take 17 g by mouth daily as needed for moderate constipation. USE AS DIRECTED) 850 g 11   pyridoxine (B-6) 100 MG tablet Take 100 mg by mouth daily.     traMADol (ULTRAM) 50 MG tablet Take 1-2 tablets (50-100 mg total) by mouth every 4 (four) hours as needed for moderate pain. 30 tablet 0   Vitamin D, Ergocalciferol, (DRISDOL) 1.25 MG (50000 UNIT) CAPS capsule TAKE 1 CAPSULE BY MOUTH EVERY 7 DAYS 4 capsule 0   JARDIANCE 10 MG TABS tablet TAKE 1 TABLET BY MOUTH DAILY BEFORE BREAKFAST 30 tablet 7   Semaglutide,0.25 or 0.5MG/DOS, (OZEMPIC, 0.25 OR 0.5 MG/DOSE,) 2 MG/1.5ML SOPN Inject 0.25 mg into the skin once weekly x 4 weeks. Then inject 0.5 mg into the skin once weekly. 1.5 mL 3   No facility-administered medications prior to visit.    Allergies  Allergen Reactions   Prednisone Other (See Comments)    Caused blood sugar to increase, pt will not take    ROS Review of Systems Review of Systems:  A fourteen system review of systems was performed and found to be positive as per HPI.   Objective:    Physical Exam General:  Well Developed, well nourished, appropriate for stated age.  Neuro:  Alert and oriented,  extra-ocular muscles intact  HEENT:  Normocephalic, atraumatic, normal TM's of both ears with mild fluid, neck supple Skin:  no gross rash, warm, pink. Cardiac:  RRR, S1 S2 Respiratory:  CTA B/L and A/P, Not using accessory muscles, speaking in full sentences- unlabored. Vascular:  Ext warm, no cyanosis apprec.; cap RF less 2  sec. Psych:  No HI/SI, judgement and insight good, Euthymic mood. Full Affect.  BP 99/64    Pulse 68    Temp (!) 97.3 F (36.3 C)    Ht _0  (1.803 m)    Wt 228 lb (103.4 kg)    SpO2 98%    BMI 31.80 kg/m  Wt Readings from Last 3 Encounters:  01/22/21 228 lb (103.4 kg)  11/13/20 233 lb (105.7 kg)  10/22/20 235 lb (106.6 kg)     Health Maintenance Due  Topic Date Due   Zoster Vaccines- Shingrix (1 of 2) Never done   OPHTHALMOLOGY EXAM  10/06/2018   COVID-19 Vaccine (4 - Booster for Pfizer series) 02/06/2020   INFLUENZA VACCINE  08/05/2020   Pneumonia Vaccine 22+ Years old (3 - PPSV23 if available, else PCV20) 04/17/2021    There are no preventive care reminders to display for this patient.  Lab Results  Component Value Date   TSH 2.920 04/23/2020   Lab Results  Component Value Date   WBC 5.8 04/23/2020   HGB 14.2 04/23/2020   HCT 42.6 04/23/2020   MCV 80 04/23/2020   PLT 321 04/23/2020   Lab Results  Component Value Date   NA 142 04/23/2020   K 4.9 04/23/2020   CO2 18 (L) 04/23/2020   GLUCOSE 137 (H) 04/23/2020   BUN 26 04/23/2020   CREATININE 1.79 (H) 04/23/2020   BILITOT 0.3 04/23/2020   ALKPHOS 133 (H) 04/23/2020   AST 22 04/23/2020   ALT 24 04/23/2020  PROT 7.2 04/23/2020   ALBUMIN 4.4 04/23/2020   CALCIUM 9.6 04/23/2020   ANIONGAP 7 04/01/2020   EGFR 42 (L) 04/23/2020   GFR 58.84 (L) 04/17/2016   Lab Results  Component Value Date   CHOL 145 04/23/2020   Lab Results  Component Value Date   HDL 43 04/23/2020   Lab Results  Component Value Date   LDLCALC 78 04/23/2020   Lab Results  Component Value Date   TRIG 137 04/23/2020   Lab Results  Component Value Date   CHOLHDL 3.4 04/23/2020   Lab Results  Component Value Date   HGBA1C 6.7 (A) 01/22/2021      Assessment & Plan:   Problem List Items Addressed This Visit       Cardiovascular and Mediastinum   Hypertension associated with diabetes ([place]) (Chronic)     -Controlled. -Continue current medication regimen. -Will continue to monitor. Will collect CMP for medication monitoring.      Relevant Medications   Semaglutide,0.25 or 0.5MG/DOS, (OZEMPIC, 0.25 OR 0.5 MG/DOSE,) 2 MG/1.5ML SOPN   empagliflozin (JARDIANCE) 10 MG TABS tablet   Other Relevant Orders   Comp Met (CMET)   CBC w/Diff     Endocrine   Hyperlipidemia associated with type 2 diabetes mellitus (HCC) (Chronic)    -Last lipid panel: HDL 43, LDL 78 -Continue current medication regimen. -Will repeat lipid panel and hepatic function.  -Will continue to monitor.      Relevant Medications   Semaglutide,0.25 or 0.5MG/DOS, (OZEMPIC, 0.25 OR 0.5 MG/DOSE,) 2 MG/1.5ML SOPN   empagliflozin (JARDIANCE) 10 MG TABS tablet   Other Relevant Orders   Lipid Profile   Type 2 diabetes mellitus with renal complication (HCC) - Primary    -A1c has improved from 7.5 to 6.7, will continue current medication regimen. -Encourage to continue with increased physical activity and dietary changes including low carbohydrate and glucose intake. -Will continue to monitor.      Relevant Medications   Semaglutide,0.25 or 0.5MG/DOS, (OZEMPIC, 0.25 OR 0.5 MG/DOSE,) 2 MG/1.5ML SOPN   empagliflozin (JARDIANCE) 10 MG TABS tablet   Other Relevant Orders   POCT glycosylated hemoglobin (Hb A1C) (Completed)   Comp Met (CMET)   CBC w/Diff     Other   GOUT    -Stable. -Continue current medication regimen. -Will continue to monitor.        Meds ordered this encounter  Medications   Semaglutide,0.25 or 0.5MG/DOS, (OZEMPIC, 0.25 OR 0.5 MG/DOSE,) 2 MG/1.5ML SOPN    Sig: Then inject 0.5 mg into the skin once weekly.    Dispense:  1.5 mL    Refill:  3   empagliflozin (JARDIANCE) 10 MG TABS tablet    Sig: Take 1 tablet (10 mg total) by mouth daily before breakfast.    Dispense:  30 tablet    Refill:  3    Order Specific Question:   Supervising Provider    Answer:   Beatrice Lecher D [[id number]]     Follow-up: Return in about 4 months (around 05/22/2021) for CPE and FBW .    Lorrene Reid, PA-C

## 2021-01-22 NOTE — Patient Instructions (Signed)

## 2021-01-22 NOTE — Assessment & Plan Note (Signed)
-  Stable. -Continue current medication regimen.  -Will continue to monitor. 

## 2021-01-22 NOTE — Assessment & Plan Note (Signed)
-  A1c has improved from 7.5 to 6.7, will continue current medication regimen. -Encourage to continue with increased physical activity and dietary changes including low carbohydrate and glucose intake. -Will continue to monitor.

## 2021-01-22 NOTE — Assessment & Plan Note (Signed)
-  Last lipid panel: HDL 43, LDL 78 -Continue current medication regimen. -Will repeat lipid panel and hepatic function.  -Will continue to monitor.

## 2021-01-23 LAB — COMPREHENSIVE METABOLIC PANEL WITH GFR
ALT: 21 IU/L (ref 0–44)
AST: 28 IU/L (ref 0–40)
Albumin/Globulin Ratio: 1.6 (ref 1.2–2.2)
Albumin: 4.6 g/dL (ref 3.8–4.8)
Alkaline Phosphatase: 121 IU/L (ref 44–121)
BUN/Creatinine Ratio: 10 (ref 10–24)
BUN: 19 mg/dL (ref 8–27)
Bilirubin Total: 0.5 mg/dL (ref 0.0–1.2)
CO2: 23 mmol/L (ref 20–29)
Calcium: 10.2 mg/dL (ref 8.6–10.2)
Chloride: 103 mmol/L (ref 96–106)
Creatinine, Ser: 1.88 mg/dL — ABNORMAL HIGH (ref 0.76–1.27)
Globulin, Total: 2.8 g/dL (ref 1.5–4.5)
Glucose: 119 mg/dL — ABNORMAL HIGH (ref 70–99)
Potassium: 5.5 mmol/L — ABNORMAL HIGH (ref 3.5–5.2)
Sodium: 141 mmol/L (ref 134–144)
Total Protein: 7.4 g/dL (ref 6.0–8.5)
eGFR: 39 mL/min/1.73 — ABNORMAL LOW

## 2021-01-23 LAB — CBC WITH DIFFERENTIAL/PLATELET
Basophils Absolute: 0 x10E3/uL (ref 0.0–0.2)
Basos: 1 %
EOS (ABSOLUTE): 0.1 x10E3/uL (ref 0.0–0.4)
Eos: 3 %
Hematocrit: 42.7 % (ref 37.5–51.0)
Hemoglobin: 14.3 g/dL (ref 13.0–17.7)
Immature Grans (Abs): 0 x10E3/uL (ref 0.0–0.1)
Immature Granulocytes: 0 %
Lymphocytes Absolute: 1 x10E3/uL (ref 0.7–3.1)
Lymphs: 23 %
MCH: 28 pg (ref 26.6–33.0)
MCHC: 33.5 g/dL (ref 31.5–35.7)
MCV: 84 fL (ref 79–97)
Monocytes Absolute: 0.4 x10E3/uL (ref 0.1–0.9)
Monocytes: 9 %
Neutrophils Absolute: 2.8 x10E3/uL (ref 1.4–7.0)
Neutrophils: 64 %
Platelets: 261 x10E3/uL (ref 150–450)
RBC: 5.11 x10E6/uL (ref 4.14–5.80)
RDW: 13.6 % (ref 11.6–15.4)
WBC: 4.3 x10E3/uL (ref 3.4–10.8)

## 2021-01-23 LAB — LIPID PANEL
Chol/HDL Ratio: 2.6 ratio (ref 0.0–5.0)
Cholesterol, Total: 105 mg/dL (ref 100–199)
HDL: 40 mg/dL
LDL Chol Calc (NIH): 47 mg/dL (ref 0–99)
Triglycerides: 90 mg/dL (ref 0–149)
VLDL Cholesterol Cal: 18 mg/dL (ref 5–40)

## 2021-02-03 ENCOUNTER — Other Ambulatory Visit: Payer: Self-pay | Admitting: Physician Assistant

## 2021-02-03 DIAGNOSIS — M109 Gout, unspecified: Secondary | ICD-10-CM

## 2021-02-06 ENCOUNTER — Other Ambulatory Visit: Payer: Self-pay | Admitting: Physician Assistant

## 2021-02-06 DIAGNOSIS — E559 Vitamin D deficiency, unspecified: Secondary | ICD-10-CM

## 2021-02-12 ENCOUNTER — Other Ambulatory Visit: Payer: Self-pay | Admitting: Physician Assistant

## 2021-02-12 DIAGNOSIS — F39 Unspecified mood [affective] disorder: Secondary | ICD-10-CM

## 2021-02-19 ENCOUNTER — Other Ambulatory Visit: Payer: Self-pay | Admitting: Physician Assistant

## 2021-02-20 ENCOUNTER — Other Ambulatory Visit: Payer: Self-pay | Admitting: Interventional Cardiology

## 2021-02-26 DIAGNOSIS — R197 Diarrhea, unspecified: Secondary | ICD-10-CM | POA: Diagnosis not present

## 2021-02-26 DIAGNOSIS — R1032 Left lower quadrant pain: Secondary | ICD-10-CM | POA: Diagnosis not present

## 2021-02-28 NOTE — Progress Notes (Signed)
03/03/2021 [name] Conger Sr. [id number] 1955-04-13   ASSESSMENT AND PLAN:   Diarrhea, unspecified type -     CBC With Differential; Future -     Comprehensive metabolic panel; Future -     TSH; Future -     DG Abd 1 View; Future Levsin as needed for AB pain Try to talk with PCP about other DM med other than ozempic- symptoms started after taking this medication Unknown if actual obstruction, has had AB surgeries but he is passing gas and hard stools, sounds more like overflow diarrhea.  -KUB to evaluate for stool burden and rule out possibility of overflow diarrhea  -Pancreatic elastase due to risk factors for EPI IF KUB negative - CT AB and pelvis with contrast if KUB shows possible obstruction or if symptoms continue  Abdominal bloating -     DG Abd 1 View; Future  -Pancreatic elastase due to risk factors for EPI IF KUB negative   History of Present Illness:  66 y.o. male  with a past medical history of CADfor severe left main disease status post CABG 10/26/2018 follows with Dr. [name], OSA, type 2 diabetes on Ozempic ,reflux, hepatic steatosis and others listed below, known to Dr. Ardis Hughs returns to clinic today for evaluation of bowel obstruction. 10/22/2020 colonoscopy 2 adenomatous polyps (3 and 7 mm) without high-grade dysplasia, tics-recall 7 years  He has been having 3 weeks straight of diarrhea, anything he ate would go straight through. He was having LLQ AB pain.  Denies nausea or vomiting. No fever, chills.  He has had AB bloating and a lot of gas. No fatigue.   Went to UC at [facility], went on 24 hour liquid diet, and diarrhea slowed/stopped but then he had decreased BM's.  He had an upright and lying down Xray and told he had "obstruction", patient poor historian.  He has been passing gas this entire time.  This AM had small pebble stools then Wednesday he has lost 20-22 lbs but he states he is now stable again.  He was on victoza, switched to ozempic 4  weeks ago, started around that time.  He is s/p choley and umblical hernia repair   Previous GI history: 03/2020 AB US Hepatic steatosis. Small gallbladder wall polyp and sludge noted within the gallbladder.  Surgical History:  He  has a past surgical history that includes Finger amputation (2003); Cervical fusion; Lumbar fusion; Esophageal dilation (2007); Colonoscopy (2007); Carpal tunnel release (09/10/2015); CORONARY ANGIOGRAPHY (N/A, 10/25/2018); Coronary artery bypass graft (N/A, 10/26/2018); Cardiac catheterization; and Umbilical hernia repair (N/A, 04/03/2020).  Current Medications:   Current Outpatient Medications (Endocrine & Metabolic):    empagliflozin (JARDIANCE) 10 MG TABS tablet, Take 1 tablet (10 mg total) by mouth daily before breakfast.   Semaglutide,0.25 or 0.5MG/DOS, (OZEMPIC, 0.25 OR 0.5 MG/DOSE,) 2 MG/1.5ML SOPN, Then inject 0.5 mg into the skin once weekly.  Current Outpatient Medications (Cardiovascular):    atorvastatin (LIPITOR) 80 MG tablet, TAKE 1 TABLET BY MOUTH EACH NIGHT AT BEDTIME   benazepril (LOTENSIN) 10 MG tablet, TAKE 1 TABLET BY MOUTH DAILY   metoprolol succinate (TOPROL-XL) 25 MG 24 hr tablet, Take 1 tablet (25 mg total) by mouth daily.   Current Outpatient Medications (Analgesics):    acetaminophen (TYLENOL) 500 MG tablet, Take 2 tablets (1,000 mg total) by mouth every 6 (six) hours as needed.   allopurinol (ZYLOPRIM) 300 MG tablet, TAKE 1/2 TABLET BY MOUTH DAILY   aspirin 81 MG tablet, Take 81  mg by mouth daily.   traMADol (ULTRAM) 50 MG tablet, Take 1-2 tablets (50-100 mg total) by mouth every 4 (four) hours as needed for moderate pain.   Current Outpatient Medications (Other):    Ascorbic Acid (VITAMIN C) 1000 MG tablet, Take 1,000 mg by mouth daily.   blood glucose meter kit and supplies, Dispense based on patient and insurance preference. Use to check fasting blood glucose in the morning and to check glucose 2 hours after largest meal of  the day. (FOR ICD-10 E10.9, E11.9).   FLUoxetine (PROZAC) 40 MG capsule, TAKE 1 CAPSULE BY MOUTH DAILY   gabapentin (NEURONTIN) 800 MG tablet, TAKE 1 TABLET BY MOUTH THREE TIMES DAILY   glucose blood test strip, Use as instructed   Insulin Pen Needle (PEN NEEDLES) 31G X 8 MM MISC, 1 each by Does not apply route daily.   LANCETS ULTRA FINE MISC, Use to check fasting blood glucose in the morning and to check glucose 2 hours after largest meal of the day   Multiple Vitamins-Iron (MULTIVITAMINS WITH IRON) TABS, Take 1 tablet by mouth daily.   pantoprazole (PROTONIX) 40 MG tablet, TAKE 1 TABLET BY MOUTH DAILY   polyethylene glycol powder (GLYCOLAX/MIRALAX) powder, USE AS DIRECTED (Patient taking differently: Take 17 g by mouth daily as needed for moderate constipation. USE AS DIRECTED)   pyridoxine (B-6) 100 MG tablet, Take 100 mg by mouth daily.   Vitamin D, Ergocalciferol, (DRISDOL) 1.25 MG (50000 UNIT) CAPS capsule, TAKE 1 CAPSULE BY MOUTH EVERY 7 DAYS   Family History:  His family history includes Aneurysm (age of onset: 69) in his mother; Cancer in his maternal uncle and paternal uncle; Diabetes in his sister; Heart attack in his father; Heart disease in his father, sister, and sister; Kidney disease in his brother; Sudden death in his mother; Urolithiasis in his father. Social History:   reports that he quit smoking about 45 years ago. His smoking use included cigarettes. He has a 6.00 pack-year smoking history. He has never used smokeless tobacco. He reports that he does not drink alcohol and does not use drugs.  Current Medications, Allergies, Past Medical History, Past Surgical History, Family History and Social History were reviewed in [employer] record.  Physical Exam: BP 108/70 (BP Location: Left Arm, Patient Position: Sitting, Cuff Size: Normal)    Pulse 72    Ht 5' 10.25" (1.784 m) Comment: height measured without shoes   Wt 226 lb 4 oz (102.6 kg)    BMI 32.23  kg/m  General:   Pleasant, well developed male in no acute distress Eyes: sclerae anicteric,conjunctive pink  Heart:  regular rate and rhythm Pulm: Clear anteriorly; no wheezing Abdomen:  Soft, Obese AB, skin exam scars- choley and periumbilical, Normal bowel sounds. mild tenderness in the LLQ. Without guarding and Without rebound, without hepatomegaly. Extremities:  Without edema. Peripheral pulses intact.  Neurologic:  Alert and  oriented x4;  grossly normal neurologically. Skin:   Dry and intact without significant lesions or rashes. Psychiatric: Demonstrates good judgement and reason without abnormal affect or behaviors.  Vladimir Crofts, PA-C 03/03/21

## 2021-03-03 ENCOUNTER — Ambulatory Visit (INDEPENDENT_AMBULATORY_CARE_PROVIDER_SITE_OTHER): Payer: Self-pay | Admitting: Physician Assistant

## 2021-03-03 ENCOUNTER — Other Ambulatory Visit: Payer: Self-pay

## 2021-03-03 ENCOUNTER — Other Ambulatory Visit: Payer: Self-pay | Admitting: Physician Assistant

## 2021-03-03 ENCOUNTER — Other Ambulatory Visit (INDEPENDENT_AMBULATORY_CARE_PROVIDER_SITE_OTHER): Payer: PPO

## 2021-03-03 ENCOUNTER — Encounter: Payer: Self-pay | Admitting: Physician Assistant

## 2021-03-03 ENCOUNTER — Ambulatory Visit (INDEPENDENT_AMBULATORY_CARE_PROVIDER_SITE_OTHER)
Admission: RE | Admit: 2021-03-03 | Discharge: 2021-03-03 | Disposition: A | Discharge status: Home / Self Care | Payer: PPO | Source: Ambulatory Visit | Attending: Physician Assistant | Admitting: Physician Assistant

## 2021-03-03 VITALS — BP 108/70 | HR 72 | Ht 70.25 in | Wt 226.2 lb

## 2021-03-03 DIAGNOSIS — R140 Abdominal distension (gaseous): Secondary | ICD-10-CM

## 2021-03-03 DIAGNOSIS — R197 Diarrhea, unspecified: Secondary | ICD-10-CM

## 2021-03-03 DIAGNOSIS — R109 Unspecified abdominal pain: Secondary | ICD-10-CM | POA: Diagnosis not present

## 2021-03-03 DIAGNOSIS — E559 Vitamin D deficiency, unspecified: Secondary | ICD-10-CM

## 2021-03-03 LAB — COMPREHENSIVE METABOLIC PANEL
ALT: 44 U/L (ref 0–53)
AST: 29 U/L (ref 0–37)
Albumin: 4.2 g/dL (ref 3.5–5.2)
Alkaline Phosphatase: 106 U/L (ref 39–117)
BUN: 19 mg/dL (ref 6–23)
CO2: 30 mEq/L (ref 19–32)
Calcium: 9.4 mg/dL (ref 8.4–10.5)
Chloride: 103 mEq/L (ref 96–112)
Creatinine, Ser: 1.64 mg/dL — ABNORMAL HIGH (ref 0.40–1.50)
GFR: 43.39 mL/min — ABNORMAL LOW (ref >60.00)
Glucose, Bld: 76 mg/dL (ref 70–99)
Potassium: 4.2 mEq/L (ref 3.5–5.1)
Sodium: 138 mEq/L (ref 135–145)
Total Bilirubin: 0.4 mg/dL (ref 0.2–1.2)
Total Protein: 7.4 g/dL (ref 6.0–8.3)

## 2021-03-03 MED ORDER — HYOSCYAMINE SULFATE 0.125 MG PO TABS: 0.1250 mg | ORAL_TABLET | Freq: Four times a day (QID) | ORAL | 0 refills | Status: DC | PRN

## 2021-03-03 NOTE — Patient Instructions (Addendum)
If you are age 67 or older, your body mass index should be between 23-30. Your Body mass index is 32.23 kg/m. If this is out of the aforementioned range listed, please consider follow up with your Primary Care Provider.  If you are age 69 or younger, your body mass index should be between 19-25. Your Body mass index is 32.23 kg/m. If this is out of the aformentioned range listed, please consider follow up with your Primary Care Provider.   ________________________________________________________  The [facility] providers would like to encourage you to use [facility] to communicate with providers for non-urgent requests or questions.  Due to long hold times on the telephone, sending your provider a message by [facility] may be a faster and more efficient way to get a response.  Please allow 48 business hours for a response.  Please remember that this is for non-urgent requests.  _______________________________________________________ Your provider has requested that you go to the basement level for lab work before leaving today. Press "B" on the elevator. The lab is located at the first door on the left as you exit the elevator.  You will need to have an abdominal XRAY today before leaving.  The XRAY room is also in the basement.  We have sent the following medications to your pharmacy for you to pick up at your convenience: START: Hyoscyamine Take 1 tablet (0.125 mg total) by mouth every 6 (six) hours as needed for cramping.  Stop the ozemipic for now and call your PCP.   I believe your loose stools may be due to something called overflow diarrhea. If you predominantly have constipation, straining or incomplete bowel movements at times the only thing to get through the large amounts of stool is liquid.This can cause someone to feel like they are having diarrhea yet actually they have too much backup of stool. We can evaluate this with a rectal exam, and x-ray of your abdomen, or CT scan. If this is  the case usually doing a combination of Benefiber 1-2 times daily with MiraLAX 17 g daily can be helpful. At times there are medications that can also be helpful for this but generally I start with over-the-counter  I also like somebody to get a squatty potty to help straighten out the colon whether having a bowel movement.  Benefiber or Citracel is good for constipation/diarrhea/irritable bowel syndrome, it helps with weight loss and can help lower your bad cholesterol. Please do 1 TBSP in the morning in water, coffee, or tea up to twice a day. It can take up to a month before you can see a difference with your bowel movements. It is cheapest from [employer], [employer], [employer].   Miralax is an osmotic laxative.  It only brings more water into the stool.  This is safe to take daily.  Can take up to 17 gram of miralax twice a day.  Mix with juice or coffee.  Start 1 capful at night for 3-4 days and reassess your response in 3-4 days.  You can increase and decrease the dose based on your response.  Remember, it can take up to 3-4 days to take effect OR for the effects to wear off.   I often pair this with benefiber in the morning to help assure the stool is not too loose.   Go to the ER if unable to pass gas, severe AB pain, unable to hold down food, any shortness of breath of chest pain.   Due to recent changes in  healthcare laws, you may see the results of your imaging and laboratory studies on MyChart before your provider has had a chance to review them.  We understand that in some cases there may be results that are confusing or concerning to you. Not all laboratory results come back in the same time frame and the provider may be waiting for multiple results in order to interpret others.  Please give [place] 48 hours in order for your provider to thoroughly review all the results before contacting the office for clarification of your results.   Thank you for entrusting me with your care and choosing  [facility]  Vicie Mutters, PA-C

## 2021-03-04 ENCOUNTER — Telehealth: Payer: Self-pay | Admitting: Physician Assistant

## 2021-03-04 DIAGNOSIS — E1129 Type 2 diabetes mellitus with other diabetic kidney complication: Secondary | ICD-10-CM

## 2021-03-04 LAB — CBC WITH DIFFERENTIAL
Basophils Absolute: 0 10*3/uL (ref 0.0–0.2)
Basos: 0 %
EOS (ABSOLUTE): 0.2 10*3/uL (ref 0.0–0.4)
Eos: 3 %
Hematocrit: 40.2 % (ref 37.5–51.0)
Hemoglobin: 13.1 g/dL (ref 13.0–17.7)
Immature Grans (Abs): 0 10*3/uL (ref 0.0–0.1)
Immature Granulocytes: 0 %
Lymphocytes Absolute: 1.6 10*3/uL (ref 0.7–3.1)
Lymphs: 27 %
MCH: 26.9 pg (ref 26.6–33.0)
MCHC: 32.6 g/dL (ref 31.5–35.7)
MCV: 83 fL (ref 79–97)
Monocytes Absolute: 0.6 10*3/uL (ref 0.1–0.9)
Monocytes: 10 %
Neutrophils Absolute: 3.5 10*3/uL (ref 1.4–7.0)
Neutrophils: 60 %
RBC: 4.87 x10E6/uL (ref 4.14–5.80)
RDW: 14.3 % (ref 11.6–15.4)
WBC: 5.9 10*3/uL (ref 3.4–10.8)

## 2021-03-04 LAB — TSH: TSH: 1.85 u[IU]/mL (ref 0.35–5.50)

## 2021-03-04 MED ORDER — GLIPIZIDE 5 MG PO TABS: 5.0000 mg | ORAL_TABLET | Freq: Two times a day (BID) | ORAL | 1 refills | Status: DC

## 2021-03-04 NOTE — Telephone Encounter (Signed)
Patient spouse called office stating that patient recently saw Gastro (Dr. Ardis Hughs) and he has d/ced the Ozempic due to "stomach damage". Patient is needing a new medication to manage DM. AS, CMA    PGDS

## 2021-03-04 NOTE — Telephone Encounter (Signed)
Patient made aware of medication changes. Advised to check his BS daily for the next month and if numbers are elevated or low, to call the office.

## 2021-03-04 NOTE — Addendum Note (Signed)
Addended by: Mickel Crow on: 03/04/2021 02:03 PM   Modules accepted: Orders

## 2021-03-06 ENCOUNTER — Encounter: Payer: Self-pay | Admitting: Gastroenterology

## 2021-03-07 ENCOUNTER — Other Ambulatory Visit: Payer: Self-pay

## 2021-03-07 DIAGNOSIS — R140 Abdominal distension (gaseous): Secondary | ICD-10-CM

## 2021-03-07 DIAGNOSIS — R197 Diarrhea, unspecified: Secondary | ICD-10-CM

## 2021-03-07 DIAGNOSIS — R109 Unspecified abdominal pain: Secondary | ICD-10-CM

## 2021-03-14 ENCOUNTER — Other Ambulatory Visit: Payer: Self-pay

## 2021-03-14 ENCOUNTER — Ambulatory Visit (HOSPITAL_COMMUNITY)
Admission: RE | Admit: 2021-03-14 | Discharge: 2021-03-14 | Disposition: A | Discharge status: Home / Self Care | Payer: PPO | Source: Ambulatory Visit | Attending: Physician Assistant | Admitting: Physician Assistant

## 2021-03-14 DIAGNOSIS — R140 Abdominal distension (gaseous): Secondary | ICD-10-CM | POA: Diagnosis not present

## 2021-03-14 DIAGNOSIS — R197 Diarrhea, unspecified: Secondary | ICD-10-CM | POA: Diagnosis not present

## 2021-03-14 DIAGNOSIS — R109 Unspecified abdominal pain: Secondary | ICD-10-CM

## 2021-03-14 DIAGNOSIS — K5730 Diverticulosis of large intestine without perforation or abscess without bleeding: Secondary | ICD-10-CM | POA: Diagnosis not present

## 2021-03-14 MED ORDER — SODIUM CHLORIDE (PF) 0.9 % IJ SOLN
INTRAMUSCULAR | Status: AC
  Filled 2021-03-14: qty 50

## 2021-03-14 MED ORDER — IOHEXOL 300 MG/ML  SOLN
80.0000 mL | Freq: Once | INTRAMUSCULAR | Status: AC | PRN
Start: 2021-03-14 — End: 2021-03-14
  Administered 2021-03-14: 80 mL via INTRAVENOUS

## 2021-03-18 NOTE — Progress Notes (Signed)
?Established patient acute visit ? ? ?Patient: [name] Banik Sr.   DOB: Dec 15, 1955   66 y.o. Male  MRN: [id number] ?Visit Date: 03/19/2021 ? ?Chief Complaint  ?Patient presents with  ? Acute Visit  ? ?Subjective  ?  ?HPI  ?Patient presents with c/o possible bladder infection. Patient had abdomen pelvis CT which revealed possible cystitis due to mild diffuse thickening. Denies urinary frequency, cloudy urine, fever, dysuria, or flank pain. Reports having some soreness of LLQ but has improved from prior especially since taking fiber to help with stools. Has 1-2 bowel movements per day. Denies fever or bloody stools. Also reports having some low blood sugars with Glipizide and has been taking 0.5 tablet once daily which he has tolerated. ? ? ?Medications: ?Outpatient Medications Prior to Visit  ?Medication Sig  ? acetaminophen (TYLENOL) 500 MG tablet Take 2 tablets (1,000 mg total) by mouth every 6 (six) hours as needed.  ? allopurinol (ZYLOPRIM) 300 MG tablet TAKE 1/2 TABLET BY MOUTH DAILY  ? Ascorbic Acid (VITAMIN C) 1000 MG tablet Take 1,000 mg by mouth daily.  ? aspirin 81 MG tablet Take 81 mg by mouth daily.  ? atorvastatin (LIPITOR) 80 MG tablet TAKE 1 TABLET BY MOUTH EACH NIGHT AT BEDTIME  ? benazepril (LOTENSIN) 10 MG tablet TAKE 1 TABLET BY MOUTH DAILY  ? blood glucose meter kit and supplies Dispense based on patient and insurance preference. Use to check fasting blood glucose in the morning and to check glucose 2 hours after largest meal of the day. (FOR ICD-10 E10.9, E11.9).  ? empagliflozin (JARDIANCE) 10 MG TABS tablet Take 1 tablet (10 mg total) by mouth daily before breakfast.  ? FLUoxetine (PROZAC) 40 MG capsule TAKE 1 CAPSULE BY MOUTH DAILY  ? gabapentin (NEURONTIN) 800 MG tablet TAKE 1 TABLET BY MOUTH THREE TIMES DAILY  ? hyoscyamine (LEVSIN) 0.125 MG tablet Take 1 tablet (0.125 mg total) by mouth every 6 (six) hours as needed for cramping.  ? Insulin Pen Needle (PEN NEEDLES) 31G X 8 MM MISC 1 each by  Does not apply route daily.  ? LANCETS ULTRA FINE MISC Use to check fasting blood glucose in the morning and to check glucose 2 hours after largest meal of the day  ? metoprolol succinate (TOPROL-XL) 25 MG 24 hr tablet Take 1 tablet (25 mg total) by mouth daily.  ? Multiple Vitamins-Iron (MULTIVITAMINS WITH IRON) TABS Take 1 tablet by mouth daily.  ? pantoprazole (PROTONIX) 40 MG tablet TAKE 1 TABLET BY MOUTH DAILY  ? polyethylene glycol powder (GLYCOLAX/MIRALAX) powder USE AS DIRECTED (Patient taking differently: Take 17 g by mouth daily as needed for moderate constipation. USE AS DIRECTED)  ? pyridoxine (B-6) 100 MG tablet Take 100 mg by mouth daily.  ? traMADol (ULTRAM) 50 MG tablet Take 1-2 tablets (50-100 mg total) by mouth every 4 (four) hours as needed for moderate pain.  ? Vitamin D, Ergocalciferol, (DRISDOL) 1.25 MG (50000 UNIT) CAPS capsule TAKE 1 CAPSULE BY MOUTH EVERY 7 DAYS  ? [DISCONTINUED] glipiZIDE (GLUCOTROL) 5 MG tablet Take 1 tablet (5 mg total) by mouth 2 (two) times daily before a meal.  ? [DISCONTINUED] glucose blood test strip Use as instructed  ? ?No facility-administered medications prior to visit.  ? ? ?Review of Systems ?Review of Systems:  ?A fourteen system review of systems was performed and found to be positive as per HPI. ? ? ?  Objective  ?  ?BP 106/62   Pulse 64   Temp 97.7 ?  F (36.5 ?C)   Ht '5\' 11"'  (1.803 m)   Wt 227 lb (103 kg)   SpO2 98%   BMI 31.66 kg/m?  ? ? ?Physical Exam  ?General:  Well Developed, well nourished, appropriate for stated age.  ?Neuro:  Alert and oriented,  extra-ocular muscles intact  ?HEENT:  Normocephalic, atraumatic, neck supple  ?Skin:  no gross rash, warm, pink. ?Cardiac:  RRR ?Respiratory: Speaking in full sentences, unlabored. ?Abdomen: mild tenderness of LLQ, no suprapubic tenderness, no CVA tenderness b/l, no guarding or distension, +BS ?Vascular:  Ext warm, no cyanosis apprec.; cap RF less 2 sec. ?Psych:  No HI/SI, judgement and insight good,  Euthymic mood. Full Affect. ? ? ?Results for orders placed or performed in visit on 03/19/21  ?POCT urinalysis dipstick  ?Result Value Ref Range  ? Color, UA Yellow   ? Clarity, UA Clear   ? Glucose, UA Positive (A) Negative  ? Bilirubin, UA Negative   ? Ketones, UA Negative   ? Spec Grav, UA 1.015 1.010 - 1.025  ? Blood, UA Negative   ? pH, UA 6.0 5.0 - 8.0  ? Protein, UA Negative Negative  ? Urobilinogen, UA 0.2 0.2 or 1.0 E.U./dL  ? Nitrite, UA Negative   ? Leukocytes, UA Negative Negative  ? Appearance    ? Odor    ? ? Assessment & Plan  ?  ? ?Problem List Items Addressed This Visit   ? ?  ? Endocrine  ? Type 2 diabetes mellitus with renal complication (HCC)  ?  -Discussed with patient to continue with Glipizide 2.5 mg once daily and recommend to take before largest meal (supper) and continue to monitor glucose. Continue Jardiance 10 mg. Follow up as scheduled. ?  ?  ? Relevant Medications  ? glipiZIDE (GLUCOTROL) 5 MG tablet  ? ?Other Visit Diagnoses   ? ? Abnormal CT of the abdomen    -  Primary  ? Relevant Orders  ? POCT urinalysis dipstick (Completed)  ? Urine Culture  ? ?  ? ? ?Abnormal CT of the abdomen: ?-UA collected, unremarkable with the exception of glucosuria (pt on Jardiance). Pt asymptomatic. Will send for urine culture to r/o cystitis but reassurance provided currently no signs of UTI present. Discussed to monitor for new symptoms related to cystitis and to return to the clinic. Recommend to continue with fiber supplement.  ?-CT abdomen and pelvis w/ contrast 03/14/21: IMPRESSION: ?There is no evidence intestinal obstruction or pneumoperitoneum. ?Appendix is not dilated. There is no hydronephrosis. ?  ?Diverticulosis of colon without signs of focal diverticulitis. Mild ?diffuse wall thickening in the bladder may be due to incomplete ?distention or suggest cystitis. Lumbar spondylosis. ?  ?There is new 1.7 cm mixed density structure in the head of the left ?femur which may be due to degenerative  arthritis. Degenerative ?changes with bony spurs seen in both hips, more so on the left side. ? ? ? ? ?Return if symptoms worsen or fail to improve.  ?   ? ? ? ?Lorrene Reid, PA-C  ?[facility] ?[phone] (phone) ?[phone] (fax) ? ?[facility] ?

## 2021-03-19 ENCOUNTER — Ambulatory Visit (INDEPENDENT_AMBULATORY_CARE_PROVIDER_SITE_OTHER): Payer: PPO | Admitting: Physician Assistant

## 2021-03-19 ENCOUNTER — Other Ambulatory Visit: Payer: Self-pay

## 2021-03-19 ENCOUNTER — Encounter: Payer: Self-pay | Admitting: Physician Assistant

## 2021-03-19 VITALS — BP 106/62 | HR 64 | Temp 97.7°F | Ht 71.0 in | Wt 227.0 lb

## 2021-03-19 DIAGNOSIS — R935 Abnormal findings on diagnostic imaging of other abdominal regions, including retroperitoneum: Secondary | ICD-10-CM

## 2021-03-19 DIAGNOSIS — E1129 Type 2 diabetes mellitus with other diabetic kidney complication: Secondary | ICD-10-CM | POA: Diagnosis not present

## 2021-03-19 DIAGNOSIS — N3090 Cystitis, unspecified without hematuria: Secondary | ICD-10-CM | POA: Diagnosis not present

## 2021-03-19 LAB — POCT URINALYSIS DIPSTICK
Bilirubin, UA: NEGATIVE
Blood, UA: NEGATIVE
Glucose, UA: POSITIVE — AB
Ketones, UA: NEGATIVE
Leukocytes, UA: NEGATIVE
Nitrite, UA: NEGATIVE
Protein, UA: NEGATIVE
Spec Grav, UA: 1.015 (ref 1.010–1.025)
Urobilinogen, UA: 0.2 E.U./dL
pH, UA: 6 (ref 5.0–8.0)

## 2021-03-19 MED ORDER — GLIPIZIDE 5 MG PO TABS
2.5000 mg | ORAL_TABLET | Freq: Every day | ORAL | 0 refills | Status: DC
Start: 2021-03-19 — End: 2021-07-01

## 2021-03-19 NOTE — Patient Instructions (Signed)
Urinary Tract Infection, Adult °A urinary tract infection (UTI) is an infection of any part of the urinary tract. The urinary tract includes: °The kidneys. °The ureters. °The bladder. °The urethra. °These organs make, store, and get rid of pee (urine) in the body. °What are the causes? °This infection is caused by germs (bacteria) in your genital area. These germs grow and cause swelling (inflammation) of your urinary tract. °What increases the risk? °The following factors may make you more likely to develop this condition: °Using a small, thin tube (catheter) to drain pee. °Not being able to control when you pee or poop (incontinence). °Being male. If you are male, these things can increase the risk: °Using these methods to prevent pregnancy: °A medicine that kills sperm (spermicide). °A device that blocks sperm (diaphragm). °Having low levels of a male hormone (estrogen). °Being pregnant. °You are more likely to develop this condition if: °You have genes that add to your risk. °You are sexually active. °You take antibiotic medicines. °You have trouble peeing because of: °A prostate that is bigger than normal, if you are male. °A blockage in the part of your body that drains pee from the bladder. °A kidney stone. °A nerve condition that affects your bladder. °Not getting enough to drink. °Not peeing often enough. °You have other conditions, such as: °Diabetes. °A weak disease-fighting system (immune system). °Sickle cell disease. °Gout. °Injury of the spine. °What are the signs or symptoms? °Symptoms of this condition include: °Needing to pee right away. °Peeing small amounts often. °Pain or burning when peeing. °Blood in the pee. °Pee that smells bad or not like normal. °Trouble peeing. °Pee that is cloudy. °Fluid coming from the vagina, if you are male. °Pain in the belly or lower back. °Other symptoms include: °Vomiting. °Not feeling hungry. °Feeling mixed up (confused). This may be the first symptom in  older adults. °Being tired and grouchy (irritable). °A fever. °Watery poop (diarrhea). °How is this treated? °Taking antibiotic medicine. °Taking other medicines. °Drinking enough water. °In some cases, you may need to see a specialist. °Follow these instructions at home: °Medicines °Take over-the-counter and prescription medicines only as told by your doctor. °If you were prescribed an antibiotic medicine, take it as told by your doctor. Do not stop taking it even if you start to feel better. °General instructions °Make sure you: °Pee until your bladder is empty. °Do not hold pee for a long time. °Empty your bladder after sex. °Wipe from front to back after peeing or pooping if you are a male. Use each tissue one time when you wipe. °Drink enough fluid to keep your pee pale yellow. °Keep all follow-up visits. °Contact a doctor if: °You do not get better after 1-2 days. °Your symptoms go away and then come back. °Get help right away if: °You have very bad back pain. °You have very bad pain in your lower belly. °You have a fever. °You have chills. °You feeling like you will vomit or you vomit. °Summary °A urinary tract infection (UTI) is an infection of any part of the urinary tract. °This condition is caused by germs in your genital area. °There are many risk factors for a UTI. °Treatment includes antibiotic medicines. °Drink enough fluid to keep your pee pale yellow. °This information is not intended to replace advice given to you by your health care provider. Make sure you discuss any questions you have with your health care provider. °Document Revised: 08/04/2019 Document Reviewed: 08/04/2019 °Elsevier Patient Education © 2022   [employer] ° °

## 2021-03-19 NOTE — Assessment & Plan Note (Signed)
-  Discussed with patient to continue with Glipizide 2.5 mg once daily and recommend to take before largest meal (supper) and continue to monitor glucose. Continue Jardiance 10 mg. Follow up as scheduled. ?

## 2021-03-22 LAB — URINE CULTURE: Organism ID, Bacteria: NO GROWTH

## 2021-04-02 ENCOUNTER — Other Ambulatory Visit: Payer: Self-pay | Admitting: Physician Assistant

## 2021-04-02 DIAGNOSIS — E559 Vitamin D deficiency, unspecified: Secondary | ICD-10-CM

## 2021-04-05 ENCOUNTER — Other Ambulatory Visit: Payer: Self-pay | Admitting: Physician Assistant

## 2021-04-05 DIAGNOSIS — E559 Vitamin D deficiency, unspecified: Secondary | ICD-10-CM

## 2021-04-09 ENCOUNTER — Telehealth: Payer: Self-pay | Admitting: Physician Assistant

## 2021-04-09 NOTE — Telephone Encounter (Signed)
Patient is requesting refill of his vitamin D. Please advise.  ?

## 2021-04-09 NOTE — Telephone Encounter (Signed)
Have the patient schedule a lab visit to have the RX resent. His vitamin d levels are in normal ranges. If he doesn't want to the blood work he can take OTC Vitamin D3 2000 units daily.  ?

## 2021-04-10 ENCOUNTER — Other Ambulatory Visit: Payer: PPO

## 2021-04-10 DIAGNOSIS — M545 Low back pain, unspecified: Secondary | ICD-10-CM | POA: Diagnosis not present

## 2021-04-10 DIAGNOSIS — E559 Vitamin D deficiency, unspecified: Secondary | ICD-10-CM | POA: Diagnosis not present

## 2021-04-11 LAB — VITAMIN D 25 HYDROXY (VIT D DEFICIENCY, FRACTURES): Vit D, 25-Hydroxy: 89.7 ng/mL (ref 30.0–100.0)

## 2021-04-23 DIAGNOSIS — M109 Gout, unspecified: Secondary | ICD-10-CM | POA: Diagnosis not present

## 2021-04-23 DIAGNOSIS — N183 Chronic kidney disease, stage 3 unspecified: Secondary | ICD-10-CM | POA: Diagnosis not present

## 2021-04-23 DIAGNOSIS — I129 Hypertensive chronic kidney disease with stage 1 through stage 4 chronic kidney disease, or unspecified chronic kidney disease: Secondary | ICD-10-CM | POA: Diagnosis not present

## 2021-04-23 DIAGNOSIS — E1122 Type 2 diabetes mellitus with diabetic chronic kidney disease: Secondary | ICD-10-CM | POA: Diagnosis not present

## 2021-05-01 DIAGNOSIS — M545 Low back pain, unspecified: Secondary | ICD-10-CM | POA: Diagnosis not present

## 2021-05-06 ENCOUNTER — Ambulatory Visit (INDEPENDENT_AMBULATORY_CARE_PROVIDER_SITE_OTHER): Payer: PPO | Admitting: Pulmonary Disease

## 2021-05-06 ENCOUNTER — Encounter: Payer: Self-pay | Admitting: Pulmonary Disease

## 2021-05-06 VITALS — BP 136/78 | HR 60 | Temp 97.8°F | Ht 71.0 in | Wt 230.8 lb

## 2021-05-06 DIAGNOSIS — G4733 Obstructive sleep apnea (adult) (pediatric): Secondary | ICD-10-CM

## 2021-05-06 DIAGNOSIS — Z9989 Dependence on other enabling machines and devices: Secondary | ICD-10-CM

## 2021-05-06 NOTE — Progress Notes (Signed)
? ?[facility] ? ?Chief Complaint  ?Patient presents with  ? Follow-up  ?  Cpap follow up.   ? ? ?Past Surgical History:  ?He  has a past surgical history that includes Finger amputation (2003); Cervical fusion; Lumbar fusion; Esophageal dilation (2007); Colonoscopy (2007); Carpal tunnel release (09/10/2015); CORONARY ANGIOGRAPHY (N/A, 10/25/2018); Coronary artery bypass graft (N/A, 10/26/2018); Cardiac catheterization; and Umbilical hernia repair (N/A, 04/03/2020). ? ?Past Medical History:  ?Carpal tunnel, CAD s/p CABG, DM, GERD, Gout, Fatty liver, HLD, HTN ? ?Constitutional:  ?BP 136/78 (BP Location: Right Arm, Patient Position: Sitting, Cuff Size: Large)   Pulse 60   Temp 97.8 ?F (36.6 ?C) (Oral)   Ht _0  (1.803 m)   Wt 230 lb 12.8 oz (104.7 kg)   SpO2 94%   BMI 32.19 kg/m?  ? ?Brief Summary:  ?[name] Marchitto Sr. is a 66 y.o. male former smoker with obstructive sleep apnea. ?  ? ? ? ?Subjective:  ? ?He is here with his wife. ? ?Uses CPAP nightly.  Has full face mask.  Occasional mouth dryness.  No issues with sinus congestion.  His wife says he kicks his legs at night, but he doesn't have any leg symptoms. ? ?Physical Exam:  ? ?Appearance - well kempt  ? ?ENMT - no sinus tenderness, no oral exudate, no LAN, Mallampati 4 airway, no stridor ? ?Respiratory - equal breath sounds bilaterally, no wheezing or rales ? ?CV - s1s2 regular rate and rhythm, no murmurs ? ?Ext - no clubbing, no edema ? ?Skin - no rashes ? ?Psych - normal mood and affect ?  ?Sleep Tests:  ?PSG 03/08/03 >> AHI 33 ?Auto CPAP 04/05/21 to 05/04/21 >> used on 30 of 30 nights with average 8 hrs 5 min.  Average AHI 5.2 with median CPAP 12 and 95 th percentile CPAP 14 cm H2O ? ?Social History:  ?He  reports that he quit smoking about 45 years ago. His smoking use included cigarettes. He has a 6.00 pack-year smoking history. He has never used smokeless tobacco. He reports that he does not drink alcohol and  does not use drugs. ? ?Family History:  ?His family history includes Aneurysm (age of onset: 36) in his mother; Cancer in his maternal uncle and paternal uncle; Diabetes in his sister; Heart attack in his father; Heart disease in his father, sister, and sister; Kidney disease in his brother; Sudden death in his mother; Urolithiasis in his father. ?  ? ? ?Assessment/Plan:  ? ?Obstructive sleep apnea. ?- he is compliant with CPAP and reports benefit from therapy ?- he uses [employer] for his DME ?- continue auto CPAP 10 to 17 cm H2O ?- he should be eligible for a new machine after December 2023 ? ?Leg movements at night. ?- could have periodic limb movements ?- no symptoms to suggest restless legs syndrome ?- doesn't warrant therapy at this time ?- monitor clinically ?  ?Obesity. ?- encouraged him to keep up with weight loss efforts ? ?Time Spent Involved in Patient Care on Day of Examination:  ?25 minutes ? ?Follow up:  ? ?Patient Instructions  ?Follow up in 1 year ? ?Medication List:  ? ?Allergies as of 05/06/2021   ? ?   Reactions  ? Prednisone Other (See Comments)  ? Caused blood sugar to increase, pt will not take  ? ?  ? ?  ?Medication List  ?  ? ?  ? Accurate as of May 06, 2021 11:37 AM.  If you have any questions, ask your nurse or doctor.  ?  ?  ? ?  ? ?acetaminophen 500 MG tablet ?Commonly known as: TYLENOL ?Take 2 tablets (1,000 mg total) by mouth every 6 (six) hours as needed. ?  ?allopurinol 300 MG tablet ?Commonly known as: ZYLOPRIM ?TAKE 1/2 TABLET BY MOUTH DAILY ?  ?aspirin 81 MG tablet ?Take 81 mg by mouth daily. ?  ?atorvastatin 80 MG tablet ?Commonly known as: LIPITOR ?TAKE 1 TABLET BY MOUTH EACH NIGHT AT BEDTIME ?  ?benazepril 10 MG tablet ?Commonly known as: LOTENSIN ?TAKE 1 TABLET BY MOUTH DAILY ?  ?blood glucose meter kit and supplies ?Dispense based on patient and insurance preference. Use to check fasting blood glucose in the morning and to check glucose 2 hours after largest meal of the day. (FOR  ICD-10 E10.9, E11.9). ?  ?empagliflozin 10 MG Tabs tablet ?Commonly known as: Jardiance ?Take 1 tablet (10 mg total) by mouth daily before breakfast. ?  ?FLUoxetine 40 MG capsule ?Commonly known as: PROZAC ?TAKE 1 CAPSULE BY MOUTH DAILY ?  ?gabapentin 800 MG tablet ?Commonly known as: NEURONTIN ?TAKE 1 TABLET BY MOUTH THREE TIMES DAILY ?  ?glipiZIDE 5 MG tablet ?Commonly known as: GLUCOTROL ?Take 0.5 tablets (2.5 mg total) by mouth daily before supper. ?  ?hyoscyamine 0.125 MG tablet ?Commonly known as: LEVSIN ?Take 1 tablet (0.125 mg total) by mouth every 6 (six) hours as needed for cramping. ?  ?Lancets Ultra Fine Misc ?Use to check fasting blood glucose in the morning and to check glucose 2 hours after largest meal of the day ?  ?metoprolol succinate 25 MG 24 hr tablet ?Commonly known as: TOPROL-XL ?Take 1 tablet (25 mg total) by mouth daily. ?  ?multivitamins with iron Tabs tablet ?Take 1 tablet by mouth daily. ?  ?pantoprazole 40 MG tablet ?Commonly known as: PROTONIX ?TAKE 1 TABLET BY MOUTH DAILY ?  ?Pen Needles 31G X 8 MM Misc ?1 each by Does not apply route daily. ?  ?polyethylene glycol powder 17 GM/SCOOP powder ?Commonly known as: GLYCOLAX/MIRALAX ?USE AS DIRECTED ?What changed:  ?how much to take ?how to take this ?when to take this ?reasons to take this ?  ?pyridoxine 100 MG tablet ?Commonly known as: B-6 ?Take 100 mg by mouth daily. ?  ?traMADol 50 MG tablet ?Commonly known as: ULTRAM ?Take 1-2 tablets (50-100 mg total) by mouth every 4 (four) hours as needed for moderate pain. ?  ?vitamin C 1000 MG tablet ?Take 1,000 mg by mouth daily. ?  ?Vitamin D (Ergocalciferol) 1.25 MG (50000 UNIT) Caps capsule ?Commonly known as: DRISDOL ?TAKE 1 CAPSULE BY MOUTH EVERY 7 DAYS ?  ? ?  ? ? ?Signature:  ?Chesley Mires, MD ?[facility] ?Pager - [phone] - [phone] ?05/06/2021, 11:37 AM ?  ? ? ? ? ? ? ? ? ?

## 2021-05-06 NOTE — Patient Instructions (Signed)
Follow up in 1 year.

## 2021-05-09 ENCOUNTER — Other Ambulatory Visit: Payer: Self-pay | Admitting: Physician Assistant

## 2021-05-09 DIAGNOSIS — M109 Gout, unspecified: Secondary | ICD-10-CM

## 2021-05-19 ENCOUNTER — Other Ambulatory Visit: Payer: Self-pay | Admitting: Neurosurgery

## 2021-05-19 DIAGNOSIS — Z6832 Body mass index (BMI) 32.0-32.9, adult: Secondary | ICD-10-CM | POA: Diagnosis not present

## 2021-05-19 DIAGNOSIS — M5442 Lumbago with sciatica, left side: Secondary | ICD-10-CM | POA: Diagnosis not present

## 2021-05-23 ENCOUNTER — Encounter: Payer: Self-pay | Admitting: Physician Assistant

## 2021-05-23 ENCOUNTER — Ambulatory Visit (INDEPENDENT_AMBULATORY_CARE_PROVIDER_SITE_OTHER): Payer: PPO | Admitting: Physician Assistant

## 2021-05-23 VITALS — BP 105/66 | HR 61 | Temp 97.7°F | Ht 71.0 in | Wt 229.0 lb

## 2021-05-23 DIAGNOSIS — E1169 Type 2 diabetes mellitus with other specified complication: Secondary | ICD-10-CM

## 2021-05-23 DIAGNOSIS — E1129 Type 2 diabetes mellitus with other diabetic kidney complication: Secondary | ICD-10-CM

## 2021-05-23 DIAGNOSIS — I152 Hypertension secondary to endocrine disorders: Secondary | ICD-10-CM

## 2021-05-23 DIAGNOSIS — F39 Unspecified mood [affective] disorder: Secondary | ICD-10-CM | POA: Diagnosis not present

## 2021-05-23 DIAGNOSIS — E785 Hyperlipidemia, unspecified: Secondary | ICD-10-CM

## 2021-05-23 DIAGNOSIS — E1159 Type 2 diabetes mellitus with other circulatory complications: Secondary | ICD-10-CM | POA: Diagnosis not present

## 2021-05-23 LAB — POCT GLYCOSYLATED HEMOGLOBIN (HGB A1C): Hemoglobin A1C: 7.1 % — AB (ref 4.0–5.6)

## 2021-05-23 NOTE — Assessment & Plan Note (Signed)
-  A1c today 7.1, has increased from 6.7 likely secondary to recent corticosteroid therapy. Will continue current medication regimen and repeat A1c in 3 months. Follow a diabetic diet. Will continue to monitor.

## 2021-05-23 NOTE — Assessment & Plan Note (Signed)
-  Last lipid panel: HDL 40, LDL 47 (at goal<70). -Continue current medication regimen. -Will continue to monitor.

## 2021-05-23 NOTE — Assessment & Plan Note (Signed)
-  Controlled. Continue current medication regimen. Will continue to monitor. Will repeat CMP at f/up visit.

## 2021-05-23 NOTE — Progress Notes (Signed)
Established patient visit   Patient: [name] Speranza Sr.   DOB: 02/06/1955   65 y.o. Male  MRN: [id number] Visit Date: 05/23/2021  Chief Complaint  Patient presents with   Diabetes   Subjective    HPI  Patient presents for chronic follow-up.  Diabetes: Pt denies increased urination or thirst. Pt reports medication compliance. No hypoglycemic events since Glipizide was reduced to 2.5 mg. Checking glucose at home. FBS lately have been 160-200 because he took 2 courses of steroid taper for acute back pain exacerbation, reports having an MRI because still having issues. Before steroid therapy, sugars were running from 120-130.   HTN: Pt denies chest pain, palpitations, dizziness or lower extremity swelling. Taking medication as directed without side effects.   HLD: Pt taking medication as directed without issues. No myalgias.   Mood: Patient reports mood has been stable. Denies labile mood, SI or HI. Reports medication compliance.   Medications: Outpatient Medications Prior to Visit  Medication Sig   acetaminophen (TYLENOL) 500 MG tablet Take 2 tablets (1,000 mg total) by mouth every 6 (six) hours as needed.   allopurinol (ZYLOPRIM) 300 MG tablet TAKE 1/2 TABLET BY MOUTH DAILY   Ascorbic Acid (VITAMIN C) 1000 MG tablet Take 1,000 mg by mouth daily.   aspirin 81 MG tablet Take 81 mg by mouth daily.   atorvastatin (LIPITOR) 80 MG tablet TAKE 1 TABLET BY MOUTH EACH NIGHT AT BEDTIME   benazepril (LOTENSIN) 10 MG tablet TAKE 1 TABLET BY MOUTH DAILY   blood glucose meter kit and supplies Dispense based on patient and insurance preference. Use to check fasting blood glucose in the morning and to check glucose 2 hours after largest meal of the day. (FOR ICD-10 E10.9, E11.9).   empagliflozin (JARDIANCE) 10 MG TABS tablet Take 1 tablet (10 mg total) by mouth daily before breakfast.   FLUoxetine (PROZAC) 40 MG capsule TAKE 1 CAPSULE BY MOUTH DAILY   gabapentin (NEURONTIN) 800 MG tablet TAKE 1  TABLET BY MOUTH THREE TIMES DAILY   glipiZIDE (GLUCOTROL) 5 MG tablet Take 0.5 tablets (2.5 mg total) by mouth daily before supper.   hyoscyamine (LEVSIN) 0.125 MG tablet Take 1 tablet (0.125 mg total) by mouth every 6 (six) hours as needed for cramping.   Insulin Pen Needle (PEN NEEDLES) 31G X 8 MM MISC 1 each by Does not apply route daily.   LANCETS ULTRA FINE MISC Use to check fasting blood glucose in the morning and to check glucose 2 hours after largest meal of the day   metoprolol succinate (TOPROL-XL) 25 MG 24 hr tablet Take 1 tablet (25 mg total) by mouth daily.   Multiple Vitamins-Iron (MULTIVITAMINS WITH IRON) TABS Take 1 tablet by mouth daily.   pantoprazole (PROTONIX) 40 MG tablet TAKE 1 TABLET BY MOUTH DAILY   polyethylene glycol powder (GLYCOLAX/MIRALAX) powder USE AS DIRECTED (Patient taking differently: Take 17 g by mouth daily as needed for moderate constipation. USE AS DIRECTED)   pyridoxine (B-6) 100 MG tablet Take 100 mg by mouth daily.   traMADol (ULTRAM) 50 MG tablet Take 1-2 tablets (50-100 mg total) by mouth every 4 (four) hours as needed for moderate pain.   Vitamin D, Ergocalciferol, (DRISDOL) 1.25 MG (50000 UNIT) CAPS capsule TAKE 1 CAPSULE BY MOUTH EVERY 7 DAYS   No facility-administered medications prior to visit.    Review of Systems Review of Systems:  A fourteen system review of systems was performed and found to be positive as per HPI.  Last   CBC Lab Results  Component Value Date   WBC 5.9 03/03/2021   HGB 13.1 03/03/2021   HCT 40.2 03/03/2021   MCV 83 03/03/2021   MCH 26.9 03/03/2021   RDW 14.3 03/03/2021   PLT 261 98/92/1194   Last metabolic panel Lab Results  Component Value Date   GLUCOSE 76 03/03/2021   NA 138 03/03/2021   K 4.2 03/03/2021   CL 103 03/03/2021   CO2 30 03/03/2021   BUN 19 03/03/2021   CREATININE 1.64 (H) 03/03/2021   EGFR 39 (L) 01/22/2021   CALCIUM 9.4 03/03/2021   PHOS 3.4 08/30/2018   PROT 7.4 03/03/2021   ALBUMIN  4.2 03/03/2021   LABGLOB 2.8 01/22/2021   AGRATIO 1.6 01/22/2021   BILITOT 0.4 03/03/2021   ALKPHOS 106 03/03/2021   AST 29 03/03/2021   ALT 44 03/03/2021   ANIONGAP 7 04/01/2020   Last lipids Lab Results  Component Value Date   CHOL 105 01/22/2021   HDL 40 01/22/2021   LDLCALC 47 01/22/2021   TRIG 90 01/22/2021   CHOLHDL 2.6 01/22/2021   Last hemoglobin A1c Lab Results  Component Value Date   HGBA1C 6.7 (A) 01/22/2021   Last thyroid functions Lab Results  Component Value Date   TSH 1.85 03/03/2021   Last vitamin D Lab Results  Component Value Date   VD25OH 89.7 04/10/2021      05/23/2021    8:38 AM 01/22/2021    9:22 AM 10/21/2020    3:28 PM 05/21/2020   10:11 AM 04/01/2020   10:20 AM  Depression screen PHQ 2/9  Decreased Interest 0 0 0 0 0  Down, Depressed, Hopeless 0 0 1 0 0  PHQ - 2 Score 0 0 1 0 0  Altered sleeping  _0 0  Tired, decreased energy  1 0 0 0  Change in appetite  1 0 0 0  Feeling bad or failure about yourself   0 0 0 0  Trouble concentrating  0 0 0 0  Moving slowly or fidgety/restless  0 0 0 0  Suicidal thoughts  0 0 0 0  PHQ-9 Score  _1 0  Difficult doing work/chores  Not difficult at all Not difficult at all        01/22/2021    9:22 AM 10/21/2020    3:28 PM 08/09/2018    2:11 PM 02/08/2018   10:03 AM  GAD 7 : Generalized Anxiety Score  Nervous, Anxious, on Edge 0 0 0 0  Control/stop worrying 1 1 0 0  Worry too much - different things 1 1 0 0  Trouble relaxing 0 1 0 0  Restless 0 0 0 0  Easily annoyed or irritable 0 0 0 0  Afraid - awful might happen 0 0 0 0  Total GAD 7 Score 2 3 0 0  Anxiety Difficulty Not difficult at all Somewhat difficult  Not difficult at all        Objective    BP 105/66   Pulse 61   Temp 97.7 F (36.5 C)   Ht 5' 11" (1.803 m)   Wt 229 lb (103.9 kg)   SpO2 98%   BMI 31.94 kg/m  BP Readings from Last 3 Encounters:  05/23/21 105/66  05/06/21 136/78  03/19/21 106/62   Wt Readings from Last 3  Encounters:  05/23/21 229 lb (103.9 kg)  05/06/21 230 lb 12.8 oz (104.7 kg)  03/19/21 227 lb (103 kg)  Physical Exam  General:  Well Developed, well nourished, appropriate for stated age.  Neuro:  Alert and oriented,  extra-ocular muscles intact  HEENT:  Normocephalic, atraumatic, neck supple  Skin:  no gross rash, warm, pink. Cardiac:  RRR, S1 S2 Respiratory: CTA B/L  Vascular:  Ext warm, no cyanosis apprec.; cap RF less 2 sec. Psych:  No HI/SI, judgement and insight good, Euthymic mood. Full Affect.   No results found for any visits on 05/23/21.  Assessment & Plan      Problem List Items Addressed This Visit       Cardiovascular and Mediastinum   Hypertension associated with diabetes ([place]) (Chronic)    -Controlled. Continue current medication regimen. Will continue to monitor. Will repeat CMP at f/up visit.         Endocrine   Hyperlipidemia associated with type 2 diabetes mellitus (HCC) (Chronic)    -Last lipid panel: HDL 40, LDL 47 (at goal<70). -Continue current medication regimen. -Will continue to monitor.       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 Will continue current medication regimen and repeat A1c in 3 months. Follow a diabetic diet. Will continue to monitor.       Relevant Orders   POCT glycosylated hemoglobin (Hb A1C)     Other   Mood disorder (HCC)    -Stable. Continue current medication regimen. Will continue to monitor.        Return in about 3 months (around 08/23/2021) for DM, HTN, HLD.        Lorrene Reid, PA-C  [facility] at [facility] [phone] (phone) [phone] (fax)  [place]

## 2021-05-23 NOTE — Assessment & Plan Note (Signed)
-  Stable. -Continue current medication regimen.  -Will continue to monitor. 

## 2021-05-23 NOTE — Patient Instructions (Signed)

## 2021-05-27 ENCOUNTER — Ambulatory Visit
Admission: RE | Admit: 2021-05-27 | Discharge: 2021-05-27 | Disposition: A | Discharge status: Home / Self Care | Payer: PPO | Source: Ambulatory Visit | Attending: Neurosurgery | Admitting: Neurosurgery

## 2021-05-27 DIAGNOSIS — M5442 Lumbago with sciatica, left side: Secondary | ICD-10-CM

## 2021-05-27 DIAGNOSIS — M5127 Other intervertebral disc displacement, lumbosacral region: Secondary | ICD-10-CM | POA: Diagnosis not present

## 2021-05-27 DIAGNOSIS — M48061 Spinal stenosis, lumbar region without neurogenic claudication: Secondary | ICD-10-CM | POA: Diagnosis not present

## 2021-05-27 DIAGNOSIS — M47816 Spondylosis without myelopathy or radiculopathy, lumbar region: Secondary | ICD-10-CM | POA: Diagnosis not present

## 2021-05-27 MED ORDER — GADOBENATE DIMEGLUMINE 529 MG/ML IV SOLN
20.0000 mL | Freq: Once | INTRAVENOUS | Status: AC | PRN
  Administered 2021-05-27: 20 mL via INTRAVENOUS

## 2021-06-03 DIAGNOSIS — M5442 Lumbago with sciatica, left side: Secondary | ICD-10-CM | POA: Diagnosis not present

## 2021-06-03 DIAGNOSIS — Z6831 Body mass index (BMI) 31.0-31.9, adult: Secondary | ICD-10-CM | POA: Diagnosis not present

## 2021-06-09 ENCOUNTER — Other Ambulatory Visit: Payer: Self-pay | Admitting: Neurosurgery

## 2021-06-09 ENCOUNTER — Other Ambulatory Visit (HOSPITAL_COMMUNITY): Payer: Self-pay | Admitting: Neurosurgery

## 2021-06-09 DIAGNOSIS — M5442 Lumbago with sciatica, left side: Secondary | ICD-10-CM

## 2021-06-16 ENCOUNTER — Telehealth: Payer: Self-pay | Admitting: Physician Assistant

## 2021-06-16 DIAGNOSIS — E1129 Type 2 diabetes mellitus with other diabetic kidney complication: Secondary | ICD-10-CM

## 2021-06-16 MED ORDER — EMPAGLIFLOZIN 10 MG PO TABS: 10.0000 mg | ORAL_TABLET | Freq: Every day | ORAL | 3 refills | Status: DC

## 2021-06-16 NOTE — Telephone Encounter (Signed)
Patient requesting refill of Jardiance. Please advise.  

## 2021-06-16 NOTE — Telephone Encounter (Signed)
Rx sent to pharmacy. AS, CMA 

## 2021-07-01 ENCOUNTER — Other Ambulatory Visit: Payer: Self-pay | Admitting: Physician Assistant

## 2021-07-01 DIAGNOSIS — E1129 Type 2 diabetes mellitus with other diabetic kidney complication: Secondary | ICD-10-CM

## 2021-07-04 ENCOUNTER — Other Ambulatory Visit: Payer: Self-pay

## 2021-07-04 ENCOUNTER — Ambulatory Visit (HOSPITAL_COMMUNITY)
Admission: RE | Admit: 2021-07-04 | Discharge: 2021-07-04 | Disposition: A | Discharge status: Home / Self Care | Payer: PPO | Source: Ambulatory Visit | Attending: Neurosurgery | Admitting: Neurosurgery

## 2021-07-04 DIAGNOSIS — M4186 Other forms of scoliosis, lumbar region: Secondary | ICD-10-CM | POA: Diagnosis not present

## 2021-07-04 DIAGNOSIS — M5442 Lumbago with sciatica, left side: Secondary | ICD-10-CM | POA: Diagnosis present

## 2021-07-04 DIAGNOSIS — I700 Atherosclerosis of aorta: Secondary | ICD-10-CM | POA: Diagnosis not present

## 2021-07-04 DIAGNOSIS — M47814 Spondylosis without myelopathy or radiculopathy, thoracic region: Secondary | ICD-10-CM | POA: Diagnosis not present

## 2021-07-04 DIAGNOSIS — E119 Type 2 diabetes mellitus without complications: Secondary | ICD-10-CM | POA: Diagnosis not present

## 2021-07-04 DIAGNOSIS — M48061 Spinal stenosis, lumbar region without neurogenic claudication: Secondary | ICD-10-CM | POA: Insufficient documentation

## 2021-07-04 DIAGNOSIS — M4326 Fusion of spine, lumbar region: Secondary | ICD-10-CM | POA: Diagnosis not present

## 2021-07-04 DIAGNOSIS — M5137 Other intervertebral disc degeneration, lumbosacral region: Secondary | ICD-10-CM | POA: Insufficient documentation

## 2021-07-04 DIAGNOSIS — M545 Low back pain, unspecified: Secondary | ICD-10-CM | POA: Diagnosis not present

## 2021-07-04 LAB — GLUCOSE, CAPILLARY: Glucose-Capillary: 144 mg/dL — ABNORMAL HIGH (ref 70–99)

## 2021-07-04 MED ORDER — ONDANSETRON HCL 4 MG/2ML IJ SOLN
4.0000 mg | Freq: Four times a day (QID) | INTRAMUSCULAR | Status: DC | PRN
Start: 2021-07-04 — End: 2021-07-05

## 2021-07-04 MED ORDER — DIAZEPAM 5 MG PO TABS
10.0000 mg | ORAL_TABLET | Freq: Once | ORAL | Status: AC
  Administered 2021-07-04: 10 mg via ORAL

## 2021-07-04 MED ORDER — IOHEXOL 180 MG/ML  SOLN
20.0000 mL | Freq: Once | INTRAMUSCULAR | Status: AC | PRN
  Administered 2021-07-04: 20 mL via INTRATHECAL

## 2021-07-04 MED ORDER — LIDOCAINE HCL (PF) 1 % IJ SOLN
5.0000 mL | Freq: Once | INTRAMUSCULAR | Status: AC
  Administered 2021-07-04: 5 mL via INTRADERMAL

## 2021-07-04 MED ORDER — DIAZEPAM 5 MG PO TABS
ORAL_TABLET | ORAL | Status: AC
  Filled 2021-07-04: qty 2

## 2021-07-04 NOTE — Op Note (Signed)
*   No surgery found * lumbar Myelogram  PATIENT:  Abdalrahman Clementson Sr. is a 66 y.o. male  PRE-OPERATIVE DIAGNOSIS:  lumbago  POST-OPERATIVE DIAGNOSIS:  lumbago  PROCEDURE:  Lumbar Myelogram  SURGEON:  Suraj Ramdass  ANESTHESIA:   local LOCAL MEDICATIONS USED:  LIDOCAINE  Procedure Note: Wisdom Rickey Sr. is a 66 y.o. male Was taken to the fluoroscopy suite and  positioned prone on the fluoroscopy table. His back was prepared and draped in a sterile manner. I infiltrated 8 cc into the lumbar region. I then introduced a spinal needle into the thecal sac at the L3/4 interlaminar space. I infiltrated 20cc of Isovue 180 into the thecal sac. Fluoroscopy showed the needle and contrast in the thecal sac. [name] Sr. tolerated the procedure well. he Will be taken to CT for evaluation.     PATIENT DISPOSITION:  Short Stay

## 2021-07-04 NOTE — Discharge Instructions (Signed)
Myelogram and Lumbar Puncture Discharge Instructions  Go home and rest quietly for the next 24 hours.  It is important to lie flat for the next 24 hours.  Get up only to go to the restroom.  You may lie in the bed or on a couch on your back, your stomach, your left side or your right side.  You may have one pillow under your head.  You may have pillows between your knees while you are on your side or under your knees while you are on your back.  DO NOT drive today.  Recline the seat as far back as it will go, while still wearing your seat belt, on the way home.  You may get up to go to the bathroom as needed.  You may sit up for 10 minutes to eat.  You may resume your normal diet and medications unless otherwise indicated.  The incidence of headache, nausea, or vomiting is about 5% (one in 20 patients).  If you develop a headache, lie flat and drink plenty of fluids until the headache goes away.  Caffeinated beverages may be helpful.  If you develop severe nausea and vomiting or a headache that does not go away with flat bed rest, call [phone].  You may resume normal activities after your 24 hours of bed rest is over; however, do not exert yourself strongly or do any heavy lifting tomorrow.  Call your physician for a follow-up appointment.  The results of your myelogram will be sent directly to your physician by the following day.  Discharge instructions have been explained to the patient.  The patient, or the person responsible for the patient, fully understands these instructions.

## 2021-07-14 DIAGNOSIS — Z6834 Body mass index (BMI) 34.0-34.9, adult: Secondary | ICD-10-CM | POA: Diagnosis not present

## 2021-07-14 DIAGNOSIS — M5442 Lumbago with sciatica, left side: Secondary | ICD-10-CM | POA: Diagnosis not present

## 2021-08-04 ENCOUNTER — Other Ambulatory Visit: Payer: Self-pay | Admitting: Physician Assistant

## 2021-08-04 DIAGNOSIS — M109 Gout, unspecified: Secondary | ICD-10-CM

## 2021-08-07 DIAGNOSIS — H524 Presbyopia: Secondary | ICD-10-CM | POA: Diagnosis not present

## 2021-08-07 DIAGNOSIS — H5213 Myopia, bilateral: Secondary | ICD-10-CM | POA: Diagnosis not present

## 2021-08-07 DIAGNOSIS — H2513 Age-related nuclear cataract, bilateral: Secondary | ICD-10-CM | POA: Diagnosis not present

## 2021-08-07 DIAGNOSIS — H52203 Unspecified astigmatism, bilateral: Secondary | ICD-10-CM | POA: Diagnosis not present

## 2021-08-07 DIAGNOSIS — E119 Type 2 diabetes mellitus without complications: Secondary | ICD-10-CM | POA: Diagnosis not present

## 2021-08-07 DIAGNOSIS — Z7984 Long term (current) use of oral hypoglycemic drugs: Secondary | ICD-10-CM | POA: Diagnosis not present

## 2021-08-11 ENCOUNTER — Other Ambulatory Visit: Payer: Self-pay | Admitting: Physician Assistant

## 2021-08-11 DIAGNOSIS — M109 Gout, unspecified: Secondary | ICD-10-CM

## 2021-08-11 DIAGNOSIS — F39 Unspecified mood [affective] disorder: Secondary | ICD-10-CM

## 2021-08-28 ENCOUNTER — Telehealth: Payer: Self-pay | Admitting: Physician Assistant

## 2021-08-28 DIAGNOSIS — K819 Cholecystitis, unspecified: Secondary | ICD-10-CM

## 2021-08-28 MED ORDER — PANTOPRAZOLE SODIUM 40 MG PO TBEC: 40.0000 mg | DELAYED_RELEASE_TABLET | Freq: Every day | ORAL | 0 refills | Status: DC

## 2021-08-28 NOTE — Telephone Encounter (Signed)
Rx refill sent to pharmacy. AS, CMA 

## 2021-08-28 NOTE — Telephone Encounter (Signed)
Patient requesting refill of Protonix. Please advise.

## 2021-09-22 ENCOUNTER — Other Ambulatory Visit: Payer: Self-pay | Admitting: Physician Assistant

## 2021-09-22 DIAGNOSIS — E1129 Type 2 diabetes mellitus with other diabetic kidney complication: Secondary | ICD-10-CM

## 2021-09-30 IMAGING — DX DG CHEST 1V PORT
1 series · 1 of 1 positions shown · non-contrast
Comparison: October 26, 2018.

CLINICAL DATA: Status post coronary bypass graft.

EXAM:
PORTABLE CHEST 1 VIEW

[chest ap]
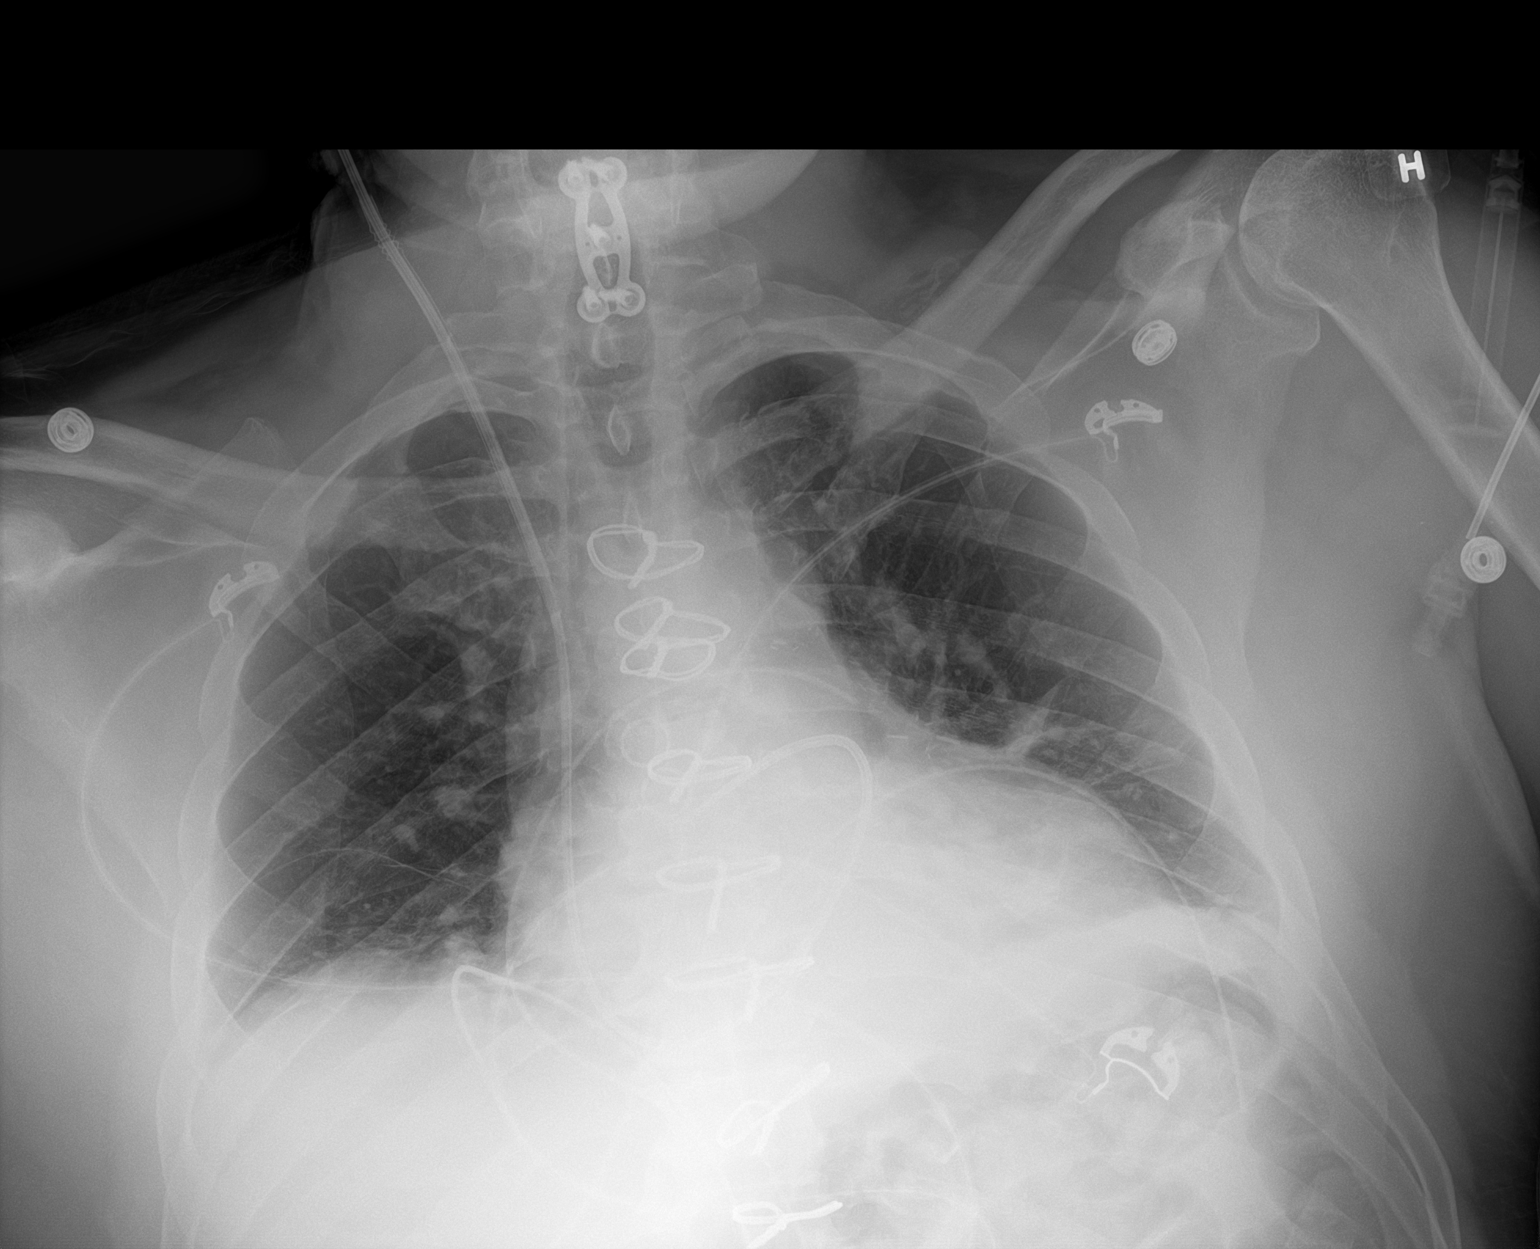

[1 of 1 positions shown; findings below may reference images not displayed]

FINDINGS: Stable cardiomediastinal silhouette. Status post coronary bypass
graft. Endotracheal and nasogastric tubes have been removed. Right
internal jugular Swan-Ganz catheter is noted with tip directed into
right pulmonary artery. Mild bibasilar subsegmental atelectasis is
noted. No pneumothorax is noted. Bony thorax is unremarkable.
IMPRESSION: Endotracheal and nasogastric tubes have been removed. Mild bibasilar
subsegmental atelectasis.

## 2021-10-01 IMAGING — DX DG CHEST 1V PORT
1 series · 1 of 1 positions shown · non-contrast
Comparison: 10/27/2018

CLINICAL DATA: Shortness of breath

EXAM:
PORTABLE CHEST 1 VIEW

[chest ap]
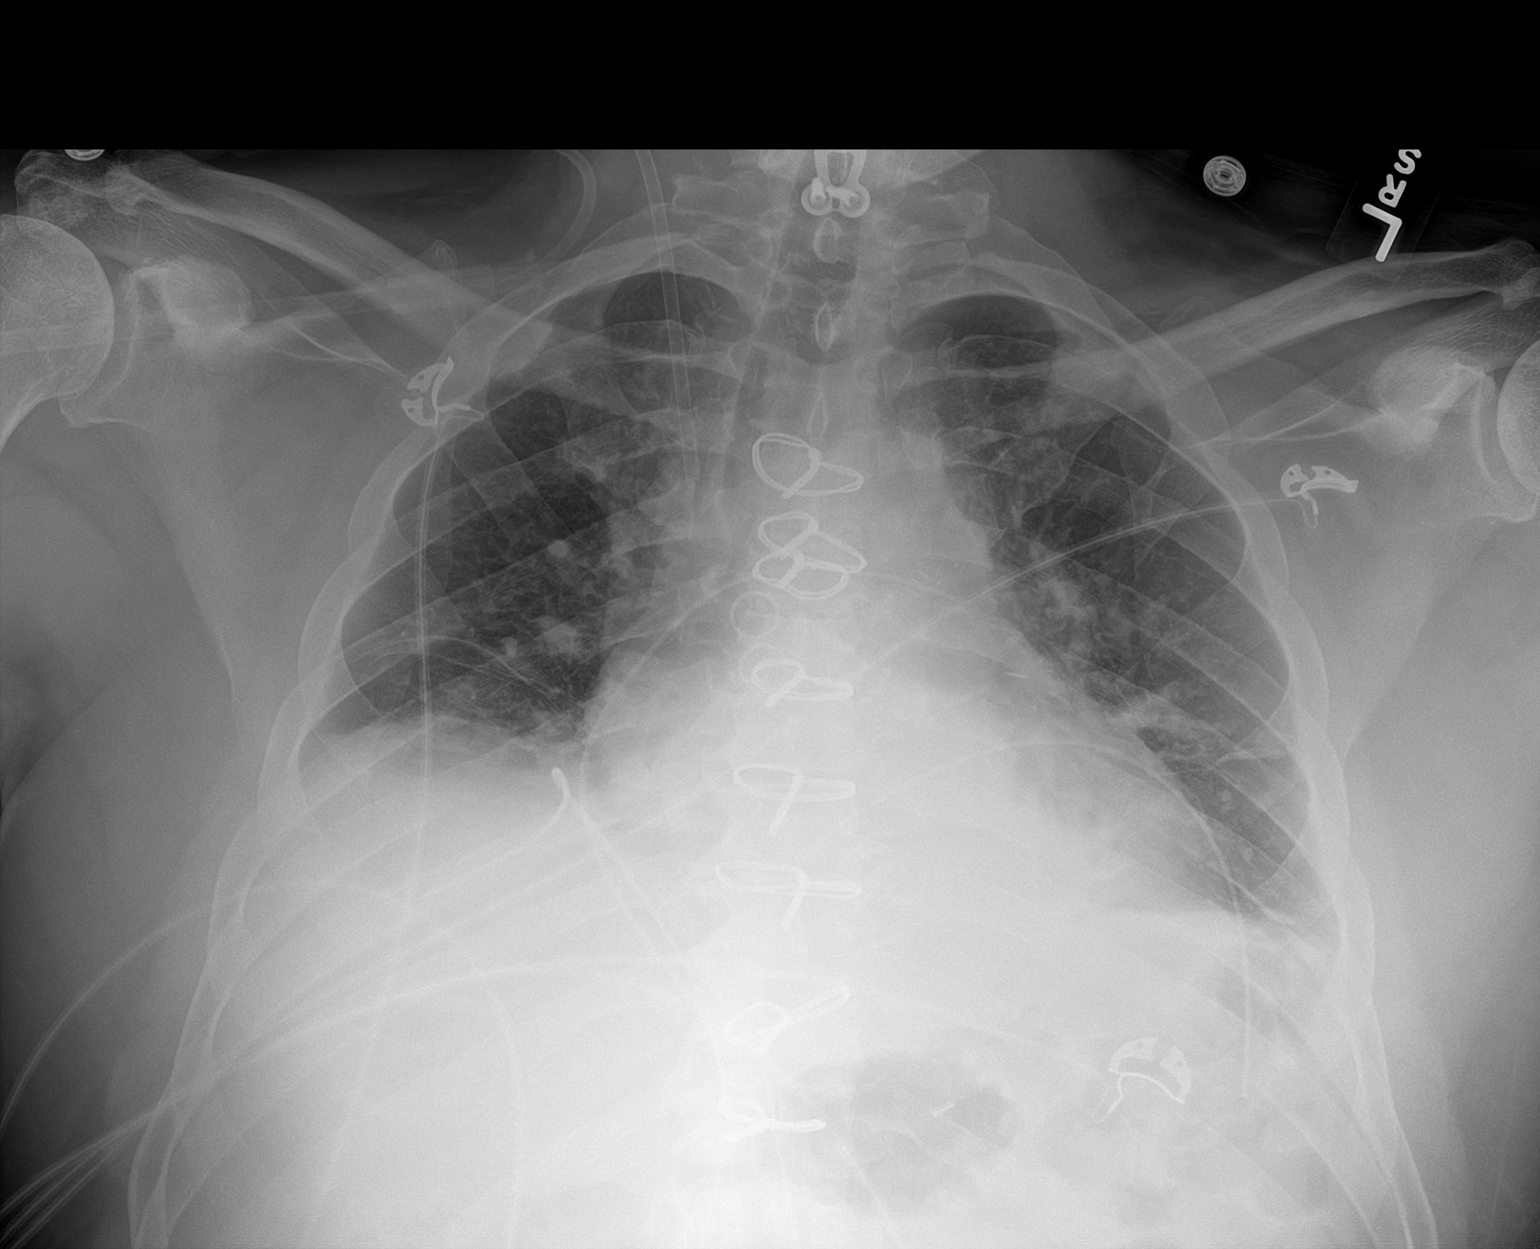

[1 of 1 positions shown; findings below may reference images not displayed]

FINDINGS: Cardiac shadow is enlarged but stable. Postsurgical changes are
again seen. Swan-Ganz catheter is been removed although the right
jugular sheath remains in place. Mediastinal drain and bilateral
thoracostomy catheters are again noted. No pneumothorax is seen. The
overall inspiratory effort is again poor with mild bibasilar
atelectasis.
IMPRESSION: Tubes and lines as described above.

Mild bibasilar atelectasis.

## 2021-10-04 IMAGING — DX DG CHEST 2V
2 series · 2 of 2 positions shown · non-contrast
Comparison: 10/29/2018

CLINICAL DATA: CABG x3 10/26/2018.

EXAM:
CHEST - 2 VIEW

[chest pa]
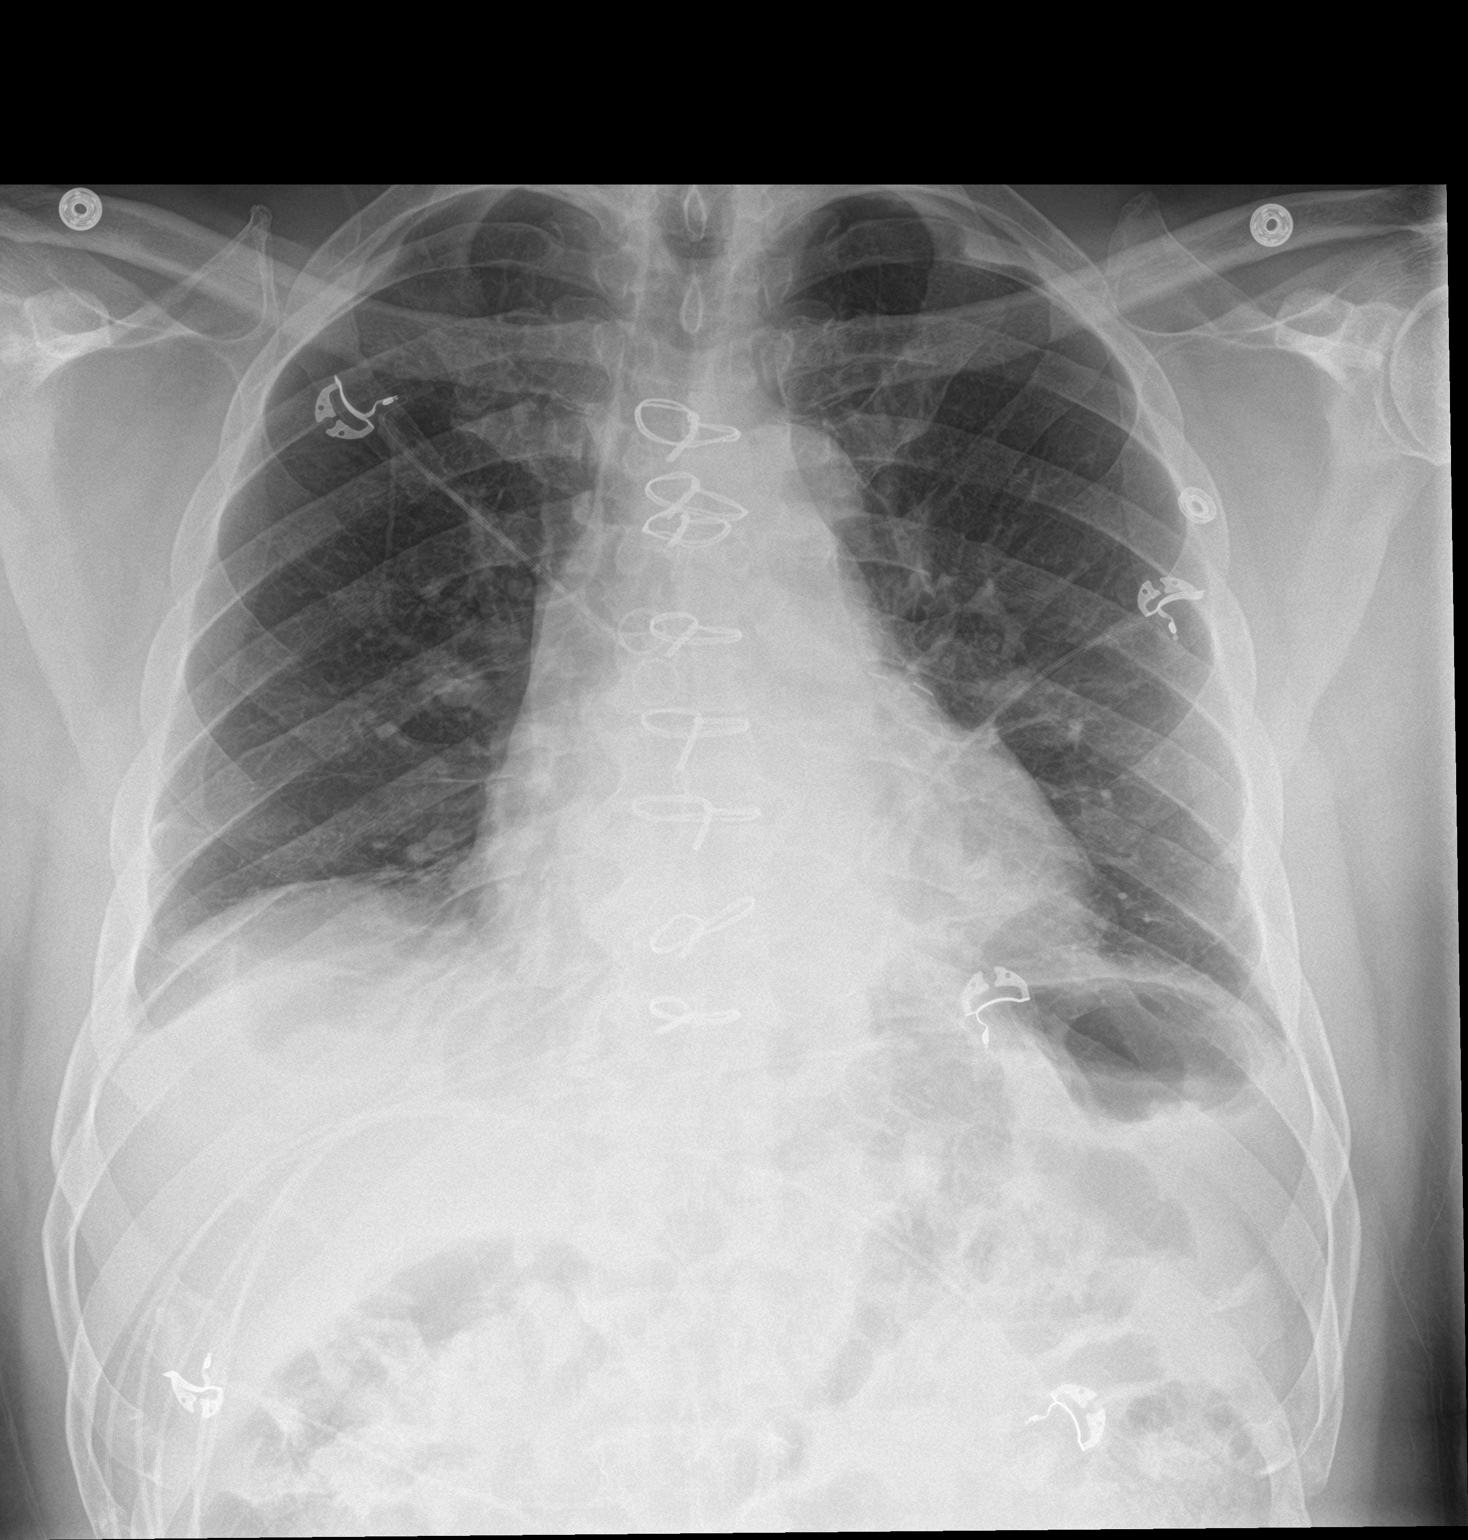

[chest lat]
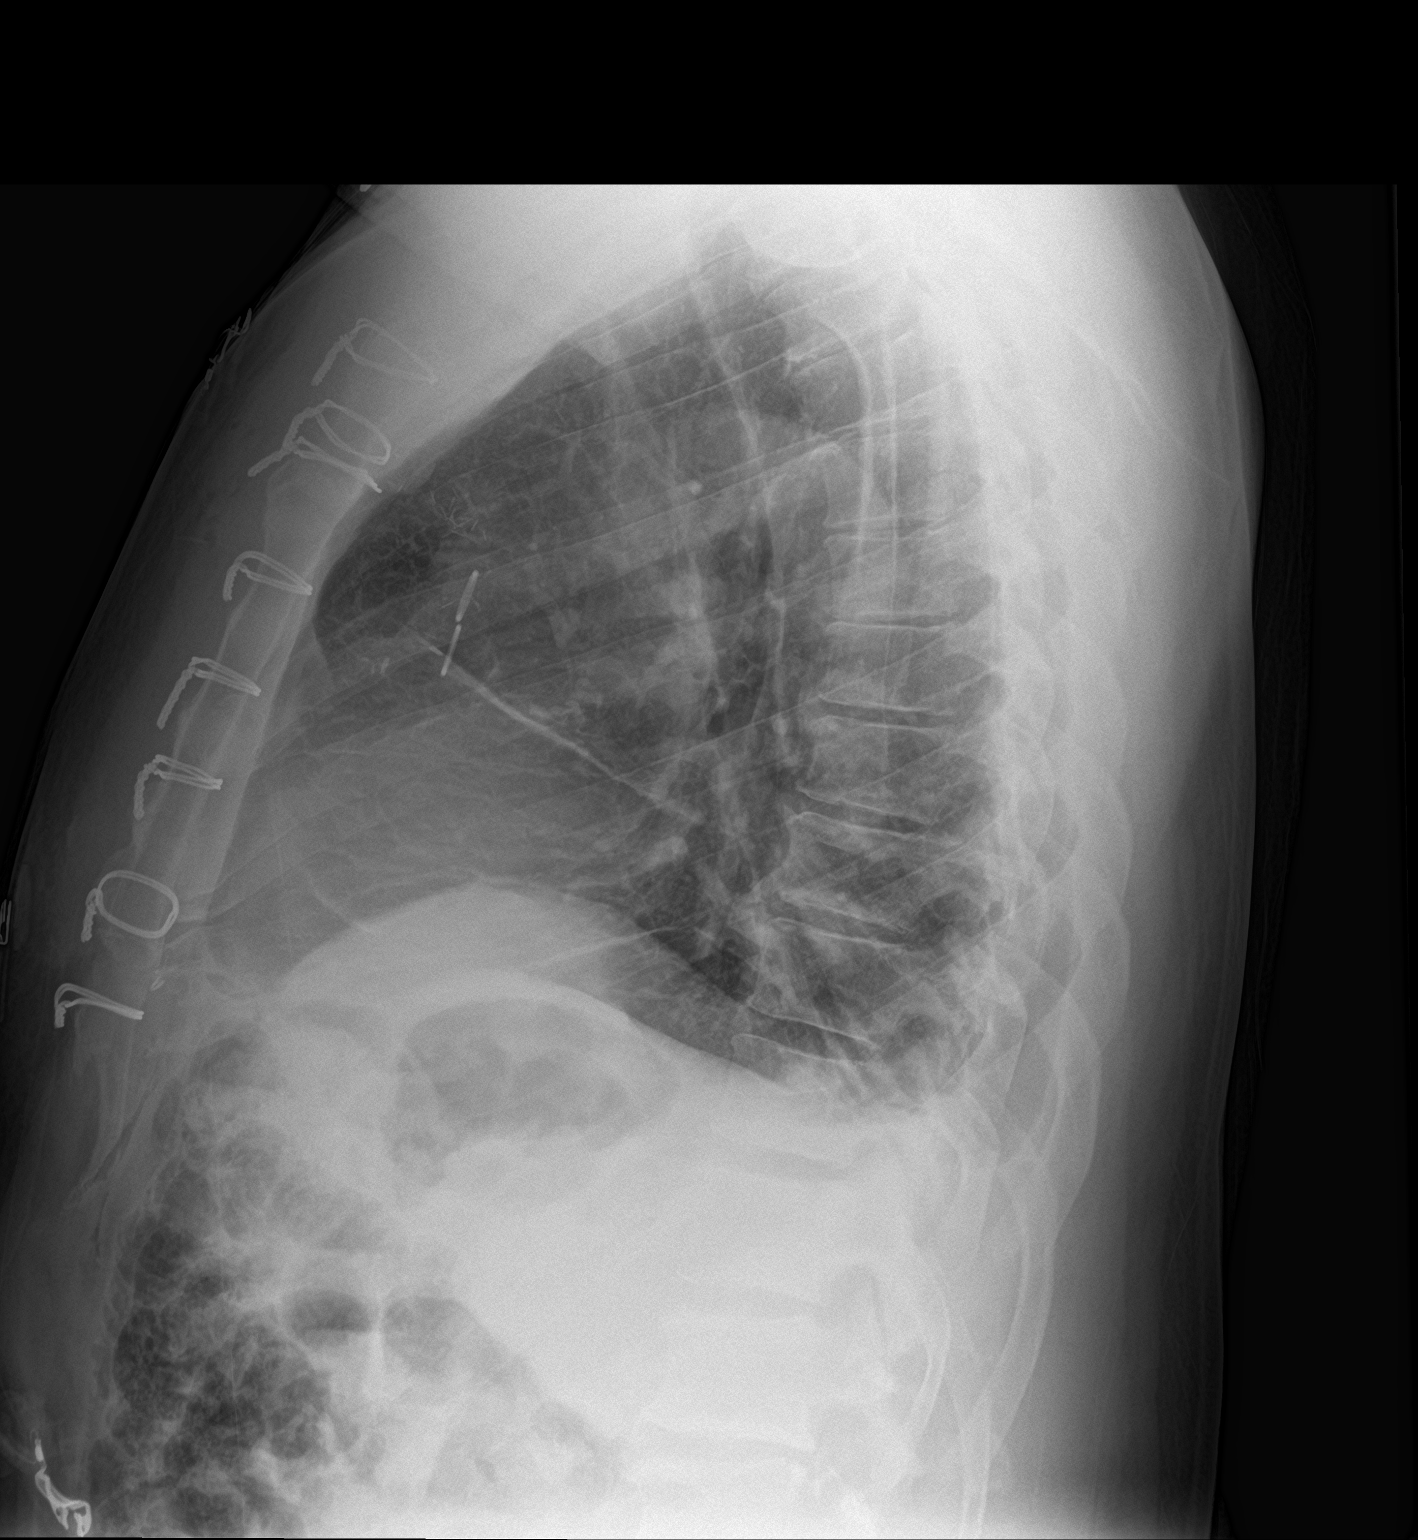

[2 of 2 positions shown; findings below may reference images not displayed]

FINDINGS: Sternotomy wires unchanged. Lungs are adequately inflated with
subtle bibasilar density likely atelectasis. Cardiomediastinal
silhouette and remainder of the exam is unchanged.
IMPRESSION: Minimal bibasilar atelectasis.

## 2021-10-13 ENCOUNTER — Telehealth: Payer: Self-pay

## 2021-10-13 NOTE — Patient Outreach (Signed)
  Care Coordination   Initial Visit Note   10/13/2021 Name: Manolito Jurewicz Sr. MRN: [id number] DOB: Aug 09, 1955  [name] Sr. is a 66 y.o. year old male who sees Lorrene Reid, [place] for primary care. I spoke with  [name] Sr. by phone today.  What matters to the patients health and wellness today?  none    Goals Addressed             This Visit's Progress    COMPLETED: Care coordination Activities-No follow up required       Care Coordination Interventions: Advised patient to Call MD office to schedule annual wellness visit. Patient to call.            SDOH assessments and interventions completed:  Yes     Care Coordination Interventions Activated:  Yes  Care Coordination Interventions:  Yes, provided   Follow up plan: No further intervention required.   Encounter Outcome:  Pt. Visit Completed   Jone Baseman, RN, MSN [place] [facility] Care Management Coordinator Direct Line [phone]

## 2021-10-13 NOTE — Patient Instructions (Signed)
Visit Information  Thank you for taking time to visit with me today. Please don't hesitate to contact me if I can be of assistance to you.   Following are the goals we discussed today:   Goals Addressed             This Visit's Progress    COMPLETED: Care coordination Activities-No follow up required       Care Coordination Interventions: Advised patient to Call MD office to schedule annual wellness visit. Patient to call.              If you are experiencing a Mental Health or [place] or need someone to talk to, please call the Suicide and Crisis Lifeline: 988   Patient verbalizes understanding of instructions and care plan provided today and agrees to view in [place]. Active MyChart status and patient understanding of how to access instructions and care plan via MyChart confirmed with patient.     No further follow up required: patient decline  Jone Baseman, RN, MSN [place] [facility] Care Management Coordinator Direct Line [phone]

## 2021-10-14 DIAGNOSIS — M5442 Lumbago with sciatica, left side: Secondary | ICD-10-CM | POA: Diagnosis not present

## 2021-10-14 DIAGNOSIS — Z6834 Body mass index (BMI) 34.0-34.9, adult: Secondary | ICD-10-CM | POA: Diagnosis not present

## 2021-10-30 DIAGNOSIS — I129 Hypertensive chronic kidney disease with stage 1 through stage 4 chronic kidney disease, or unspecified chronic kidney disease: Secondary | ICD-10-CM | POA: Diagnosis not present

## 2021-10-30 DIAGNOSIS — N183 Chronic kidney disease, stage 3 unspecified: Secondary | ICD-10-CM | POA: Diagnosis not present

## 2021-10-30 DIAGNOSIS — M109 Gout, unspecified: Secondary | ICD-10-CM | POA: Diagnosis not present

## 2021-10-30 DIAGNOSIS — E1122 Type 2 diabetes mellitus with diabetic chronic kidney disease: Secondary | ICD-10-CM | POA: Diagnosis not present

## 2021-11-03 DIAGNOSIS — G4733 Obstructive sleep apnea (adult) (pediatric): Secondary | ICD-10-CM | POA: Diagnosis not present

## 2021-11-25 ENCOUNTER — Telehealth: Payer: Self-pay

## 2021-11-25 DIAGNOSIS — E1129 Type 2 diabetes mellitus with other diabetic kidney complication: Secondary | ICD-10-CM

## 2021-11-25 DIAGNOSIS — K819 Cholecystitis, unspecified: Secondary | ICD-10-CM

## 2021-11-25 MED ORDER — EMPAGLIFLOZIN 10 MG PO TABS: 10.0000 mg | ORAL_TABLET | Freq: Every day | ORAL | 0 refills | Status: DC

## 2021-11-25 MED ORDER — PANTOPRAZOLE SODIUM 40 MG PO TBEC: 40.0000 mg | DELAYED_RELEASE_TABLET | Freq: Every day | ORAL | 0 refills | Status: DC

## 2021-11-25 NOTE — Telephone Encounter (Signed)
30 day supply sent. Office visit required for future refills.

## 2021-11-25 NOTE — Telephone Encounter (Signed)
Awesome! Thank you!

## 2021-11-25 NOTE — Telephone Encounter (Signed)
Pt wife is requesting a medication refill on: pantoprazole (PROTONIX) 40 MG tablet  empagliflozin (JARDIANCE) 10 MG TABS tablet   Pharmacy: [place], [place]

## 2021-12-02 ENCOUNTER — Telehealth: Payer: Self-pay | Admitting: *Deleted

## 2021-12-02 DIAGNOSIS — E1129 Type 2 diabetes mellitus with other diabetic kidney complication: Secondary | ICD-10-CM

## 2021-12-02 MED ORDER — GLIPIZIDE 5 MG PO TABS: 5.0000 mg | ORAL_TABLET | Freq: Two times a day (BID) | ORAL | 1 refills | Status: DC

## 2021-12-02 NOTE — Telephone Encounter (Signed)
Refill for Glipizide has been sent to [facility].

## 2021-12-02 NOTE — Telephone Encounter (Signed)
Pt in office today needing a refill on his glipizide sent to [facility]. He said he is completely out.  While here we also scheduled him a follow up appointment for his diabetes in January.Penny Arrambide Zimmerman Rumple, CMA

## 2021-12-22 ENCOUNTER — Other Ambulatory Visit: Payer: Self-pay | Admitting: Nurse Practitioner

## 2021-12-22 DIAGNOSIS — K819 Cholecystitis, unspecified: Secondary | ICD-10-CM

## 2021-12-30 DIAGNOSIS — H9193 Unspecified hearing loss, bilateral: Secondary | ICD-10-CM | POA: Diagnosis not present

## 2021-12-30 DIAGNOSIS — I129 Hypertensive chronic kidney disease with stage 1 through stage 4 chronic kidney disease, or unspecified chronic kidney disease: Secondary | ICD-10-CM | POA: Diagnosis not present

## 2021-12-30 DIAGNOSIS — E1122 Type 2 diabetes mellitus with diabetic chronic kidney disease: Secondary | ICD-10-CM | POA: Diagnosis not present

## 2021-12-30 DIAGNOSIS — E785 Hyperlipidemia, unspecified: Secondary | ICD-10-CM | POA: Diagnosis not present

## 2021-12-30 DIAGNOSIS — E669 Obesity, unspecified: Secondary | ICD-10-CM | POA: Diagnosis not present

## 2021-12-30 DIAGNOSIS — N184 Chronic kidney disease, stage 4 (severe): Secondary | ICD-10-CM | POA: Diagnosis not present

## 2021-12-30 DIAGNOSIS — F3342 Major depressive disorder, recurrent, in full remission: Secondary | ICD-10-CM | POA: Diagnosis not present

## 2021-12-30 DIAGNOSIS — G8929 Other chronic pain: Secondary | ICD-10-CM | POA: Diagnosis not present

## 2021-12-30 DIAGNOSIS — E559 Vitamin D deficiency, unspecified: Secondary | ICD-10-CM | POA: Diagnosis not present

## 2021-12-30 DIAGNOSIS — I2510 Atherosclerotic heart disease of native coronary artery without angina pectoris: Secondary | ICD-10-CM | POA: Diagnosis not present

## 2021-12-30 DIAGNOSIS — K219 Gastro-esophageal reflux disease without esophagitis: Secondary | ICD-10-CM | POA: Diagnosis not present

## 2021-12-30 DIAGNOSIS — E1169 Type 2 diabetes mellitus with other specified complication: Secondary | ICD-10-CM | POA: Diagnosis not present

## 2022-01-01 ENCOUNTER — Telehealth: Payer: Self-pay

## 2022-01-01 ENCOUNTER — Other Ambulatory Visit: Payer: Self-pay

## 2022-01-01 DIAGNOSIS — K819 Cholecystitis, unspecified: Secondary | ICD-10-CM

## 2022-01-01 MED ORDER — PANTOPRAZOLE SODIUM 40 MG PO TBEC: 40.0000 mg | DELAYED_RELEASE_TABLET | Freq: Every day | ORAL | 0 refills | Status: DC

## 2022-01-01 MED ORDER — GABAPENTIN 800 MG PO TABS: 800.0000 mg | ORAL_TABLET | Freq: Three times a day (TID) | ORAL | 1 refills | Status: DC

## 2022-01-01 NOTE — Telephone Encounter (Signed)
Rx has been sent to pharmacy

## 2022-01-01 NOTE — Telephone Encounter (Signed)
Pt wife is requesting a refill on  gabapentin (NEURONTIN) 800 MG tablet  pantoprazole (PROTONIX) 40 MG tablet   Pharmacy: [place], [place] 05/23/21 ROV 01/08/22  I advised the wife that I would request that enough medication be sent in just to the appointment time 01/08/22 and that appointment should be kept for future refills.

## 2022-01-08 ENCOUNTER — Encounter: Payer: Self-pay | Admitting: Nurse Practitioner

## 2022-01-08 ENCOUNTER — Ambulatory Visit (INDEPENDENT_AMBULATORY_CARE_PROVIDER_SITE_OTHER): Payer: PPO | Admitting: Nurse Practitioner

## 2022-01-08 VITALS — BP 133/83 | HR 64 | Resp 18 | Ht 71.0 in | Wt 239.0 lb

## 2022-01-08 DIAGNOSIS — E1159 Type 2 diabetes mellitus with other circulatory complications: Secondary | ICD-10-CM

## 2022-01-08 DIAGNOSIS — R12 Heartburn: Secondary | ICD-10-CM | POA: Diagnosis not present

## 2022-01-08 DIAGNOSIS — I25110 Atherosclerotic heart disease of native coronary artery with unstable angina pectoris: Secondary | ICD-10-CM | POA: Diagnosis not present

## 2022-01-08 DIAGNOSIS — E1129 Type 2 diabetes mellitus with other diabetic kidney complication: Secondary | ICD-10-CM | POA: Diagnosis not present

## 2022-01-08 DIAGNOSIS — I152 Hypertension secondary to endocrine disorders: Secondary | ICD-10-CM

## 2022-01-08 DIAGNOSIS — E1169 Type 2 diabetes mellitus with other specified complication: Secondary | ICD-10-CM

## 2022-01-08 DIAGNOSIS — E785 Hyperlipidemia, unspecified: Secondary | ICD-10-CM | POA: Diagnosis not present

## 2022-01-08 LAB — POCT GLYCOSYLATED HEMOGLOBIN (HGB A1C): HbA1c POC (<> result, manual entry): 7.3 % (ref 4.0–5.6)

## 2022-01-08 MED ORDER — PANTOPRAZOLE SODIUM 40 MG PO TBEC: 40.0000 mg | DELAYED_RELEASE_TABLET | Freq: Every day | ORAL | 1 refills | Status: DC

## 2022-01-08 NOTE — Progress Notes (Signed)
Established patient visit   Patient: [name] Millea Sr.   DOB: [birth year]   67 y.o. Male  MRN: [id number] Visit Date: 01/08/2022   Chief Complaint  Patient presents with   Follow-up   Diabetes   Hypertension   Hyperlipidemia   Subjective    HPI  Follow up  -type 2 diabetes  --HgbA1c is 7.3 today  -?flu vaccine  -hypertension  --generally well controlled  --does see cardiology  --blood pressure is slightly elevated today  ---states that he is having some increased personal stress --states that he is walking 2 to 3 miles per day, often goes to the gym 4 days per week  --started a part time job, working with patients with dementia   He denies chest pain, chest pressure, or shortness of breath. He denies headaches or visual disturbances. He denies abdominal pain, nausea, vomiting, or changes in bowel or bladder habits.      Medications: Outpatient Medications Prior to Visit  Medication Sig   acetaminophen (TYLENOL) 650 MG CR tablet Take 1,300 mg by mouth every 8 (eight) hours as needed for pain.   allopurinol (ZYLOPRIM) 300 MG tablet TAKE 1/2 TABLET BY MOUTH DAILY   Ascorbic Acid (VITAMIN C) 1000 MG tablet Take 1,000 mg by mouth daily.   aspirin 81 MG tablet Take 81 mg by mouth daily.   atorvastatin (LIPITOR) 80 MG tablet TAKE 1 TABLET BY MOUTH EACH NIGHT AT BEDTIME   benazepril (LOTENSIN) 10 MG tablet TAKE 1 TABLET BY MOUTH DAILY   blood glucose meter kit and supplies Dispense based on patient and insurance preference. Use to check fasting blood glucose in the morning and to check glucose 2 hours after largest meal of the day. (FOR ICD-10 E10.9, E11.9).   FLUoxetine (PROZAC) 40 MG capsule TAKE 1 CAPSULE BY MOUTH DAILY   gabapentin (NEURONTIN) 800 MG tablet Take 1 tablet (800 mg total) by mouth 3 (three) times daily.   glipiZIDE (GLUCOTROL) 5 MG tablet Take 1 tablet (5 mg total) by mouth 2 (two) times daily before a meal.   LANCETS ULTRA FINE MISC Use to check fasting blood  glucose in the morning and to check glucose 2 hours after largest meal of the day   loratadine (CLARITIN) 10 MG tablet Take 10 mg by mouth daily.   Melatonin 10 MG CAPS Take 10 mg by mouth at bedtime.   metoprolol succinate (TOPROL-XL) 25 MG 24 hr tablet Take 1 tablet (25 mg total) by mouth daily.   Multiple Vitamins-Iron (MULTIVITAMINS WITH IRON) TABS Take 1 tablet by mouth daily.   polyethylene glycol powder (GLYCOLAX/MIRALAX) powder USE AS DIRECTED (Patient taking differently: Take 17 g by mouth daily. USE AS DIRECTED)   [DISCONTINUED] pantoprazole (PROTONIX) 40 MG tablet Take 1 tablet (40 mg total) by mouth daily.   Cholecalciferol (VITAMIN D) 50 MCG (2000 UT) tablet Take 2,000 Units by mouth daily. (Patient not taking: Reported on 01/08/2022)   empagliflozin (JARDIANCE) 10 MG TABS tablet Take 1 tablet (10 mg total) by mouth daily before breakfast. (Patient not taking: Reported on 01/08/2022)   hyoscyamine (LEVSIN) 0.125 MG tablet Take 1 tablet (0.125 mg total) by mouth every 6 (six) hours as needed for cramping. (Patient not taking: Reported on 01/08/2022)   No facility-administered medications prior to visit.    Review of Systems  Constitutional:  Negative for activity change, chills, fatigue and fever.  HENT:  Negative for congestion, postnasal drip, rhinorrhea, sinus pressure, sinus pain, sneezing and sore throat.   Eyes:  Negative.   Respiratory:  Negative for cough, shortness of breath and wheezing.   Cardiovascular:  Negative for chest pain and palpitations.  Gastrointestinal:  Negative for constipation, diarrhea, nausea and vomiting.  Endocrine: Negative for cold intolerance, heat intolerance, polydipsia and polyuria.       Blood sugars doing well    Genitourinary:  Negative for dysuria, frequency and urgency.  Musculoskeletal:  Positive for arthralgias and back pain. Negative for myalgias.       Chronic   Skin:  Negative for rash.  Allergic/Immunologic: Negative for environmental  allergies.  Neurological:  Negative for dizziness, weakness and headaches.  Psychiatric/Behavioral:  The patient is not nervous/anxious.        Objective     Today's Vitals   01/08/22 1312 01/08/22 1338  BP: (Abnormal) 143/81 133/83  Pulse: 66 64  Resp: 18   SpO2: 95%   Weight: 239 lb (108.4 kg)   Height: _0  (1.803 m)    Body mass index is 33.33 kg/m.  BP Readings from Last 3 Encounters:  01/08/22 133/83  07/04/21 (Abnormal) 154/89  05/23/21 105/66    Wt Readings from Last 3 Encounters:  01/08/22 239 lb (108.4 kg)  07/04/21 235 lb (106.6 kg)  05/23/21 229 lb (103.9 kg)    Physical Exam Vitals and nursing note reviewed.  Constitutional:      Appearance: Normal appearance. He is well-developed.  HENT:     Head: Normocephalic and atraumatic.     Nose: Nose normal.     Mouth/Throat:     Mouth: Mucous membranes are moist.     Pharynx: Oropharynx is clear.  Eyes:     Extraocular Movements: Extraocular movements intact.     Conjunctiva/sclera: Conjunctivae normal.     Pupils: Pupils are equal, round, and reactive to light.  Cardiovascular:     Rate and Rhythm: Normal rate and regular rhythm.     Pulses: Normal pulses.     Heart sounds: Normal heart sounds.  Pulmonary:     Effort: Pulmonary effort is normal.     Breath sounds: Normal breath sounds.  Abdominal:     Palpations: Abdomen is soft.  Musculoskeletal:        General: Normal range of motion.     Cervical back: Normal range of motion and neck supple.  Lymphadenopathy:     Cervical: No cervical adenopathy.  Skin:    General: Skin is warm and dry.     Capillary Refill: Capillary refill takes less than 2 seconds.  Neurological:     General: No focal deficit present.     Mental Status: He is alert and oriented to person, place, and time.  Psychiatric:        Mood and Affect: Mood normal.        Behavior: Behavior normal.        Thought Content: Thought content normal.        Judgment: Judgment  normal.     Results for orders placed or performed in visit on 01/08/22  POCT HgB A1C  Result Value Ref Range   Hemoglobin A1C     HbA1c POC (<> result, manual entry) 7.3 4.0 - 5.6 %   HbA1c, POC (prediabetic range)     HbA1c, POC (controlled diabetic range)      Assessment & Plan    1. Type 2 diabetes mellitus with other kidney complication, unspecified whether long term insulin use (HCC) Hgba1c 7.3 today. Continue diabetic medication as prescribed. Reassess in  4 months  - POCT HgB A1C  2. Hypertension associated with diabetes ([place]) Stable. Continue bp medication as prescribed   3. Hyperlipidemia associated with type 2 diabetes mellitus ([place]) Continue atorvastatin as prescribed. Check fasting lipids prior to next visit and adjust dosing as indicated   4. Heartburn Continue pantoprazole as prescribed  - pantoprazole (PROTONIX) 40 MG tablet; Take 1 tablet (40 mg total) by mouth daily.  Dispense: 90 tablet; Refill: 1  5. Coronary artery disease involving native coronary artery of native heart with unstable angina pectoris (HCC) Stable. Continue regular visits with cardiology as scheduled.     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         Ronnell Freshwater, NP  [facility] at [facility] [phone] (phone) [phone] (fax)  [place]

## 2022-01-13 ENCOUNTER — Other Ambulatory Visit: Payer: Self-pay | Admitting: Interventional Cardiology

## 2022-01-31 ENCOUNTER — Other Ambulatory Visit: Payer: Self-pay | Admitting: Nurse Practitioner

## 2022-01-31 DIAGNOSIS — E1129 Type 2 diabetes mellitus with other diabetic kidney complication: Secondary | ICD-10-CM

## 2022-02-10 ENCOUNTER — Other Ambulatory Visit: Payer: Self-pay

## 2022-02-10 DIAGNOSIS — F39 Unspecified mood [affective] disorder: Secondary | ICD-10-CM

## 2022-02-10 MED ORDER — FLUOXETINE HCL 40 MG PO CAPS: 40.0000 mg | ORAL_CAPSULE | Freq: Every day | ORAL | 1 refills | Status: DC

## 2022-02-11 ENCOUNTER — Other Ambulatory Visit: Payer: Self-pay

## 2022-02-11 MED ORDER — BENAZEPRIL HCL 10 MG PO TABS: 10.0000 mg | ORAL_TABLET | Freq: Every day | ORAL | 0 refills | Status: DC

## 2022-02-23 NOTE — Progress Notes (Unsigned)
Office Visit    Patient Name: [name] Sr. Date of Encounter: 02/23/2022  Primary Care Provider:  Lorrene Reid, PA-C (Inactive) Primary Cardiologist:  Sinclair Grooms, MD (Inactive) Primary Electrophysiologist: None  Chief Complaint    [name] Conger Sr. is a 67 y.o. male with PMH of CAD s/p CABG x 3 (LIMA LIMA to LAD, SVG to D1 and SVG to OM 2), 10/2018, DM, HTN, HLD, CKD, OSA who presents today for follow-up of coronary artery disease.  Past Medical History    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   LIMA to LAD, SVG to D1 and SVG to OM2     Hyperlipidemia    Hypertension    Insomnia    Obstructive sleep apnea    OSA (obstructive sleep apnea)    Past Surgical History:  Procedure Laterality Date   CARDIAC CATHETERIZATION     CARPAL TUNNEL RELEASE  09/10/2015   RIGHT WRIST / WITH NERVE IMPINGEMENT SURGERY   CERVICAL FUSION     Dr Sherwood Gambler   COLONOSCOPY  2007   negative; [facility]   CORONARY ANGIOGRAPHY N/A 10/25/2018   Procedure: CORONARY ANGIOGRAPHY (CATH LAB);  Surgeon: Sherren Mocha, MD;  Location: [facility];  Service: Cardiovascular;  Laterality: N/A;   CORONARY ARTERY BYPASS GRAFT N/A 10/26/2018   Procedure: CORONARY ARTERY BYPASS GRAFTING (CABG) x3 using the LIMA to LAD and right greater saphenous vein via endoscopic harvest to the Diag 1 and OM 2.;  Surgeon: Lajuana Matte, MD;  Location: [facility];  Service: Open Heart Surgery;  Laterality: N/A;   ESOPHAGEAL DILATION  2007   FINGER AMPUTATION  2003    Left Index finger amputation & reattachment   LUMBAR FUSION      Dr Sherwood Gambler   UMBILICAL HERNIA REPAIR N/A 04/03/2020   Procedure: HERNIA REPAIR UMBILICAL ADULT, open;  Surgeon: Olean Ree, MD;  Location: [facility];  Service: General;  Laterality: N/A;    Allergies  Allergies  Allergen Reactions   Prednisone Other (See Comments)    Caused blood sugar to increase, pt will not take    History of Present Illness    [name] Letizia Sr.  is a 67 year old male with the above mention past medical history who presents today for annual follow-up of coronary artery disease.  Mr. Ferlita was seen in the ED following complaint of shortness of breath and 10 out of 10 chest pain.  He presented to the ED on 10/24/2018 at [facility] and was evaluated for ACS.  EKG showed SB with isolated TWI in lead III and 2D echo was completed that showed a normal EF with no RWMA.  He has a significant family history of premature CAD with CABG in his sisters in their 13s and a mother with an MI at 70 and father with CABG in his 69s.  He had a LHC performed  by Dr. Tamala Julian that showed severe complex distal left main disease extending into the LAD and left circumflex with patent nondominant RCA.  CVTS was consulted and patient underwent CABG x 3 by Dr. Kipp Brood on 10/26/2018.  He had an unremarkable postop course and overall did well.  He was last seen by Dr. Tamala Julian on 11/2020 for annual follow-up.  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  His blood pressure was under excellent control and no further changes were made to his medications at that time.   Since last being seen in the office patient reports***.  Patient denies chest pain, palpitations, dyspnea, PND, orthopnea, nausea, vomiting, dizziness, syncope, edema, weight gain, or early satiety.  ***Notes: -He was previously on Jardiance but had to discontinue due to the donut hole. Home Medications    Current  Outpatient Medications  Medication Sig Dispense Refill   glipiZIDE (GLUCOTROL) 5 MG tablet TAKE 1 TABLET BY MOUTH TWICE DAILY BEFORE A MEAL 180 tablet 0   acetaminophen (TYLENOL) 650 MG CR tablet Take 1,300 mg by mouth every 8 (eight) hours as needed for pain.     allopurinol (ZYLOPRIM) 300 MG tablet TAKE 1/2 TABLET BY MOUTH DAILY 45 tablet 1   Ascorbic Acid (VITAMIN C) 1000 MG tablet Take 1,000 mg by mouth daily.     aspirin 81 MG tablet Take 81 mg by mouth daily.     atorvastatin (LIPITOR) 80 MG tablet TAKE 1 TABLET BY MOUTH EACH NIGHT AT BEDTIME 90 tablet 3   benazepril (LOTENSIN) 10 MG tablet Take 1 tablet (10 mg total) by mouth daily. 30 tablet 0   blood glucose meter kit and supplies Dispense based on patient and insurance preference. Use to check fasting blood glucose in the morning and to check glucose 2 hours after largest meal of the day. (FOR ICD-10 E10.9, E11.9). 1 each 0   Cholecalciferol (VITAMIN D) 50 MCG (2000 UT) tablet Take 2,000 Units by mouth daily. (Patient not taking: Reported on 01/08/2022)     FLUoxetine (PROZAC) 40 MG capsule Take 1 capsule (40 mg total) by mouth daily. 90 capsule 1   gabapentin (NEURONTIN) 800 MG tablet Take 1 tablet (800 mg total) by mouth 3 (three) times daily. 90 tablet 1   LANCETS ULTRA FINE MISC Use to check fasting blood glucose in the morning and to check glucose 2 hours after largest meal of the day 100 each 12   loratadine (CLARITIN) 10 MG tablet Take 10 mg by mouth daily.     Melatonin 10 MG CAPS Take 10 mg by mouth at bedtime.     metoprolol succinate (TOPROL-XL) 25 MG 24 hr tablet Take 1 tablet (25 mg total) by mouth daily. 90 tablet 3   Multiple Vitamins-Iron (MULTIVITAMINS WITH IRON) TABS Take 1 tablet by mouth daily. 30 tablet 0   pantoprazole (PROTONIX) 40 MG tablet Take 1 tablet (40 mg total) by mouth daily. 90 tablet 1   polyethylene glycol powder (GLYCOLAX/MIRALAX) powder USE AS DIRECTED (Patient taking differently: Take 17 g by mouth  daily. USE AS DIRECTED) 850 g 11   No current facility-administered medications for this visit.     Review of Systems  Please see the history of present illness.    (+)*** (+)***  All other systems reviewed and are otherwise negative except as noted above.  Physical Exam    Wt Readings from Last 3 Encounters:  01/08/22 239 lb (108.4  kg)  07/04/21 235 lb (106.6 kg)  05/23/21 229 lb (103.9 kg)   [id number] were no vitals filed for this visit.,There is no height or weight on file to calculate BMI.  Constitutional:      Appearance: Healthy appearance. Not in distress.  Neck:     Vascular: JVD normal.  Pulmonary:     Effort: Pulmonary effort is normal.     Breath sounds: No wheezing. No rales. Diminished in the bases Cardiovascular:     Normal rate. Regular rhythm. Normal S1. Normal S2.      Murmurs: There is no murmur.  Edema:    Peripheral edema absent.  Abdominal:     Palpations: Abdomen is soft non tender. There is no hepatomegaly.  Skin:    General: Skin is warm and dry.  Neurological:     General: No focal deficit present.     Mental Status: Alert and oriented to person, place and time.     Cranial Nerves: Cranial nerves are intact.  EKG/LABS/Other Studies Reviewed    ECG personally reviewed by me today - ***  Risk Assessment/Calculations:   {Does this patient have ATRIAL FIBRILLATION?[phone]}        Lab Results  Component Value Date   WBC 5.9 03/03/2021   HGB 13.1 03/03/2021   HCT 40.2 03/03/2021   MCV 83 03/03/2021   PLT 261 01/22/2021   Lab Results  Component Value Date   CREATININE 1.64 (H) 03/03/2021   BUN 19 03/03/2021   NA 138 03/03/2021   K 4.2 03/03/2021   CL 103 03/03/2021   CO2 30 03/03/2021   Lab Results  Component Value Date   ALT 44 03/03/2021   AST 29 03/03/2021   GGT 28 03/24/2017   ALKPHOS 106 03/03/2021   BILITOT 0.4 03/03/2021   Lab Results  Component Value Date   CHOL 105 01/22/2021   HDL 40 01/22/2021   LDLCALC 47  01/22/2021   TRIG 90 01/22/2021   CHOLHDL 2.6 01/22/2021    Lab Results  Component Value Date   HGBA1C 7.3 01/08/2022    Assessment & Plan    1.  Coronary artery disease: -s/p CABG x 3 in 2020 with ( LIMA to LAD, SVG to DX1 and OM) -Today patient reports*** -Continue GDMT with  2.  Essential hypertension: -Patient's blood pressure today was***  3.  Pure hypercholesteremia: -Patient's last LDL cholesterol was***  4.  DM type II: -Patient's last hemoglobin A1c was***  5.  CKD stage III B: -Patient's last creatinine was -Currently followed by nephrology  Disposition: Follow-up with Sinclair Grooms, MD (Inactive) or APP in *** months {Are you ordering a CV Procedure (e.g. stress test, cath, DCCV, TEE, etc)?   Press F2        :[id number]   Medication Adjustments/Labs and Tests Ordered: Current medicines are reviewed at length with the patient today.  Concerns regarding medicines are outlined above.   Signed, Mable Fill, Marissa Nestle, NP 02/23/2022, 11:19 AM  [facility]  Note:  This document was prepared using Dragon voice recognition software and may include unintentional dictation errors.

## 2022-02-24 ENCOUNTER — Ambulatory Visit: Payer: PPO | Attending: Nurse Practitioner | Admitting: Nurse Practitioner

## 2022-02-24 ENCOUNTER — Encounter: Payer: Self-pay | Admitting: Nurse Practitioner

## 2022-02-24 VITALS — BP 138/92 | HR 58 | Ht 71.0 in | Wt 238.0 lb

## 2022-02-24 DIAGNOSIS — I10 Essential (primary) hypertension: Secondary | ICD-10-CM | POA: Diagnosis not present

## 2022-02-24 DIAGNOSIS — N1832 Chronic kidney disease, stage 3b: Secondary | ICD-10-CM

## 2022-02-24 DIAGNOSIS — I25810 Atherosclerosis of coronary artery bypass graft(s) without angina pectoris: Secondary | ICD-10-CM | POA: Diagnosis not present

## 2022-02-24 DIAGNOSIS — E1129 Type 2 diabetes mellitus with other diabetic kidney complication: Secondary | ICD-10-CM | POA: Diagnosis not present

## 2022-02-24 DIAGNOSIS — E7800 Pure hypercholesterolemia, unspecified: Secondary | ICD-10-CM | POA: Diagnosis not present

## 2022-02-24 MED ORDER — METOPROLOL SUCCINATE ER 25 MG PO TB24: 12.5000 mg | ORAL_TABLET | Freq: Every day | ORAL | 1 refills | Status: DC

## 2022-02-24 MED ORDER — EMPAGLIFLOZIN 10 MG PO TABS: 10.0000 mg | ORAL_TABLET | Freq: Every day | ORAL | 2 refills | Status: DC

## 2022-02-24 NOTE — Patient Instructions (Addendum)
Medication Instructions:  DECREASE Metoprolol to 12.41m Take 1 tablet daily at night RESTART Jardiance 167mTake 1 tablet once a day  *If you need a refill on your cardiac medications before your next appointment, please call your pharmacy*   Lab Work: 2 WEEKS BMET If you have labs (blood work) drawn today and your tests are completely normal, you will receive your results only by: MyCul[place]onif you have MyChart) OR A paper copy in the mail If you have any lab test that is abnormal or we need to change your treatment, we will call you to review the results.   Testing/Procedures: Your physician has requested that you have an echocardiogram. Echocardiography is a painless test that uses sound waves to create images of your heart. It provides your doctor with information about the size and shape of your heart and how well your heart's chambers and valves are working. This procedure takes approximately one hour. There are no restrictions for this procedure. Please do NOT wear cologne, perfume, aftershave, or lotions (deodorant is allowed). Please arrive 15 minutes prior to your appointment time.   Follow-Up: [facility] and your health needs are our priority.  As part of our continuing mission to provide you with exceptional heart care, we have created designated Provider Care Teams.  These Care Teams include your primary Cardiologist (physician) and Advanced Practice Providers (APPs -  Physician Assistants and Nurse Practitioners) who all work together to provide you with the care you need, when you need it.  We recommend signing up for the patient portal called "MyChart".  Sign up information is provided on this After Visit Summary.  MyChart is used to connect with patients for Virtual Visits (Telemedicine).  Patients are able to view lab/test results, encounter notes, upcoming appointments, etc.  Non-urgent messages can be sent to your provider as well.   To learn more  about what you can do with MyChart, go to [email or web address]   Your next appointment:   6 month(s)  Provider:   ErAmbrose PancoastNP       Dr HeGwyndolyn Kaufmanr MaRudean Haskellr ArLenna SciaraOther Instructions Please monitor blood pressures and keep a log of your readings.  Contact the office with in 2 weeks with your readings.  Make sure to check 2 hours after your medications.   AVOID these things for 30 minutes before checking your blood pressure: No Drinking caffeine. No Drinking alcohol. No Eating. No Smoking. No Exercising.  Five minutes before checking your blood pressure: Pee. Sit in a dining chair. Avoid sitting in a soft couch or armchair. Be quiet. Do not talk.

## 2022-03-10 ENCOUNTER — Other Ambulatory Visit: Payer: PPO

## 2022-03-18 ENCOUNTER — Other Ambulatory Visit: Payer: Self-pay

## 2022-03-18 MED ORDER — BENAZEPRIL HCL 10 MG PO TABS: 10.0000 mg | ORAL_TABLET | Freq: Every day | ORAL | 3 refills | Status: DC

## 2022-03-24 ENCOUNTER — Other Ambulatory Visit: Payer: Self-pay | Admitting: Nurse Practitioner

## 2022-03-26 ENCOUNTER — Ambulatory Visit (HOSPITAL_COMMUNITY): Payer: PPO | Attending: Cardiology

## 2022-03-26 ENCOUNTER — Ambulatory Visit: Payer: PPO

## 2022-03-26 DIAGNOSIS — E7800 Pure hypercholesterolemia, unspecified: Secondary | ICD-10-CM | POA: Insufficient documentation

## 2022-03-26 DIAGNOSIS — I10 Essential (primary) hypertension: Secondary | ICD-10-CM | POA: Diagnosis not present

## 2022-03-26 DIAGNOSIS — E1129 Type 2 diabetes mellitus with other diabetic kidney complication: Secondary | ICD-10-CM | POA: Diagnosis not present

## 2022-03-26 DIAGNOSIS — N1832 Chronic kidney disease, stage 3b: Secondary | ICD-10-CM | POA: Diagnosis not present

## 2022-03-26 DIAGNOSIS — I25810 Atherosclerosis of coronary artery bypass graft(s) without angina pectoris: Secondary | ICD-10-CM

## 2022-03-26 LAB — ECHOCARDIOGRAM COMPLETE
Area-P 1/2: 2.09 cm2
S' Lateral: 3 cm

## 2022-03-27 LAB — BASIC METABOLIC PANEL
BUN/Creatinine Ratio: 15 (ref 10–24)
BUN: 26 mg/dL (ref 8–27)
CO2: 20 mmol/L (ref 20–29)
Calcium: 9.9 mg/dL (ref 8.6–10.2)
Chloride: 105 mmol/L (ref 96–106)
Creatinine, Ser: 1.69 mg/dL — ABNORMAL HIGH (ref 0.76–1.27)
Glucose: 124 mg/dL — ABNORMAL HIGH (ref 70–99)
Potassium: 5.7 mmol/L — ABNORMAL HIGH (ref 3.5–5.2)
Sodium: 139 mmol/L (ref 134–144)
eGFR: 44 mL/min/{1.73_m2} — ABNORMAL LOW (ref >59)

## 2022-04-21 ENCOUNTER — Other Ambulatory Visit: Payer: Self-pay

## 2022-04-21 DIAGNOSIS — M109 Gout, unspecified: Secondary | ICD-10-CM

## 2022-04-21 MED ORDER — ALLOPURINOL 300 MG PO TABS: 150.0000 mg | ORAL_TABLET | Freq: Every day | ORAL | 0 refills | Status: DC

## 2022-04-28 DIAGNOSIS — M109 Gout, unspecified: Secondary | ICD-10-CM | POA: Diagnosis not present

## 2022-04-28 DIAGNOSIS — I129 Hypertensive chronic kidney disease with stage 1 through stage 4 chronic kidney disease, or unspecified chronic kidney disease: Secondary | ICD-10-CM | POA: Diagnosis not present

## 2022-04-28 DIAGNOSIS — N183 Chronic kidney disease, stage 3 unspecified: Secondary | ICD-10-CM | POA: Diagnosis not present

## 2022-04-28 DIAGNOSIS — E1122 Type 2 diabetes mellitus with diabetic chronic kidney disease: Secondary | ICD-10-CM | POA: Diagnosis not present

## 2022-05-05 ENCOUNTER — Telehealth: Payer: Self-pay | Admitting: *Deleted

## 2022-05-05 ENCOUNTER — Other Ambulatory Visit: Payer: Self-pay | Admitting: Family Medicine

## 2022-05-05 NOTE — Telephone Encounter (Signed)
LVM for pt to call office to see if he would be ok changing this appointment on 05/12/22 to a Tues/Thurs via phone with the [facility] team.

## 2022-05-12 ENCOUNTER — Encounter: Payer: PPO | Admitting: Family Medicine

## 2022-05-12 DIAGNOSIS — H903 Sensorineural hearing loss, bilateral: Secondary | ICD-10-CM | POA: Diagnosis not present

## 2022-05-14 ENCOUNTER — Ambulatory Visit (INDEPENDENT_AMBULATORY_CARE_PROVIDER_SITE_OTHER): Payer: PPO

## 2022-05-14 VITALS — Ht 71.0 in | Wt 238.0 lb

## 2022-05-14 DIAGNOSIS — Z0000 Encounter for general adult medical examination without abnormal findings: Secondary | ICD-10-CM

## 2022-05-14 NOTE — Progress Notes (Signed)
Subjective:   [name] Thesing Sr. is a 67 y.o. male who presents for Medicare Annual/Subsequent preventive examination.  Review of Systems    Virtual Visit via Telephone Note  I connected with  [name] Frasier Sr. on 05/14/22 at  2:30 PM EDT by telephone and verified that I am speaking with the correct person using two identifiers.  Location: Patient: Home Provider: Office Persons participating in the virtual visit: patient/Nurse Health Advisor   I discussed the limitations, risks, security and privacy concerns of performing an evaluation and management service by telephone and the availability of in person appointments. The patient expressed understanding and agreed to proceed.  Interactive audio and video telecommunications were attempted between this nurse and patient, however failed, due to patient having technical difficulties OR patient did not have access to video capability.  We continued and completed visit with audio only.  Some vital signs may be absent or patient reported.   [name] Rung, LPN  Cardiac Risk Factors include: advanced age (>39men, >59 women);male gender;diabetes mellitus;hypertension     Objective:    Today's Vitals   05/14/22 1440  Weight: 238 lb (108 kg)  Height: 5\' 11"  (1.803 m)   Body mass index is 33.19 kg/m.     05/14/2022    2:51 PM 07/04/2021    8:15 AM 03/29/2020   10:58 AM 10/25/2018    9:04 AM 01/21/2017    3:03 PM 11/23/2014    9:54 AM  Advanced Directives  Does Patient Have a Medical Advance Directive? No No No No No No  Would patient like information on creating a medical advance directive? No - Patient declined No - Patient declined No - Patient declined No - Patient declined No - Patient declined Yes - Educational materials given    Current Medications (verified) Outpatient Encounter Medications as of 05/14/2022  Medication Sig   acetaminophen (TYLENOL) 650 MG CR tablet Take 1,300 mg by mouth every 8 (eight) hours as needed for  pain.   allopurinol (ZYLOPRIM) 300 MG tablet Take 0.5 tablets (150 mg total) by mouth daily.   Ascorbic Acid (VITAMIN C) 1000 MG tablet Take 1,000 mg by mouth daily.   aspirin 81 MG tablet Take 81 mg by mouth daily.   atorvastatin (LIPITOR) 80 MG tablet TAKE 1 TABLET BY MOUTH EACH NIGHT AT BEDTIME   benazepril (LOTENSIN) 10 MG tablet Take 1 tablet (10 mg total) by mouth daily.   blood glucose meter kit and supplies Dispense based on patient and insurance preference. Use to check fasting blood glucose in the morning and to check glucose 2 hours after largest meal of the day. (FOR ICD-10 E10.9, E11.9).   Cholecalciferol (VITAMIN D) 50 MCG (2000 UT) tablet Take 2,000 Units by mouth daily.   empagliflozin (JARDIANCE) 10 MG TABS tablet Take 1 tablet (10 mg total) by mouth daily before breakfast.   FLUoxetine (PROZAC) 40 MG capsule Take 1 capsule (40 mg total) by mouth daily.   gabapentin (NEURONTIN) 800 MG tablet TAKE 1 TABLET BY MOUTH 3 TIMES DAILY   glipiZIDE (GLUCOTROL) 5 MG tablet TAKE 1 TABLET BY MOUTH TWICE DAILY BEFORE A MEAL   LANCETS ULTRA FINE MISC Use to check fasting blood glucose in the morning and to check glucose 2 hours after largest meal of the day   loratadine (CLARITIN) 10 MG tablet Take 10 mg by mouth daily.   Melatonin 10 MG CAPS Take 10 mg by mouth at bedtime.   metoprolol succinate (TOPROL-XL) 25 MG 24  hr tablet Take 0.5 tablets (12.5 mg total) by mouth daily.   Multiple Vitamins-Iron (MULTIVITAMINS WITH IRON) TABS Take 1 tablet by mouth daily.   pantoprazole (PROTONIX) 40 MG tablet Take 1 tablet (40 mg total) by mouth daily.   polyethylene glycol powder (GLYCOLAX/MIRALAX) powder USE AS DIRECTED (Patient taking differently: Take 17 g by mouth daily. USE AS DIRECTED)   sildenafil (VIAGRA) 100 MG tablet Take by mouth as needed for erectile dysfunction.   No facility-administered encounter medications on file as of 05/14/2022.    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   LIMA to LAD, SVG to D1 and SVG to OM2     Hyperlipidemia    Hypertension    Insomnia    Obstructive sleep apnea    OSA (obstructive sleep apnea)    Past Surgical History:  Procedure Laterality Date   CARDIAC CATHETERIZATION     CARPAL TUNNEL RELEASE  09/10/2015   RIGHT WRIST / WITH NERVE IMPINGEMENT SURGERY   CERVICAL FUSION     Dr Newell Coral   COLONOSCOPY  2007   negative; [facility]   CORONARY ANGIOGRAPHY N/A 10/25/2018   Procedure: CORONARY ANGIOGRAPHY (CATH LAB);  Surgeon: Tonny Bollman, MD;  [facility] INVASIVE CV LAB;  Service: Cardiovascular;  Laterality: N/A;   CORONARY ARTERY BYPASS GRAFT N/A 10/26/2018   Procedure: CORONARY ARTERY BYPASS GRAFTING (CABG) x3 using the LIMA to LAD and right greater saphenous vein via endoscopic harvest to the Diag 1 and OM 2.;  Surgeon: Corliss Skains, MD;  Location: [facility] OR;  Service: Open Heart Surgery;  Laterality: N/A;   ESOPHAGEAL DILATION  2007   FINGER AMPUTATION  2003   Left Index finger amputation & reattachment   LUMBAR FUSION      Dr Newell Coral   UMBILICAL HERNIA REPAIR N/A 04/03/2020   Procedure: HERNIA REPAIR UMBILICAL ADULT, open;  Surgeon: Henrene Dodge, MD;  Location: [facility] ORS;  Service: General;  Laterality: N/A;   Family History  Problem Relation Age of Onset   Aneurysm Mother 72       CNS aneurysm   Sudden death Mother    Heart disease Father        pacer   Urolithiasis Father    Heart attack Father    Diabetes Sister     Heart disease Sister    Heart disease Sister    Kidney disease Brother    Cancer Maternal Uncle        ? primary   Cancer Paternal Uncle        ? primary   Hyperlipidemia Neg Hx    Hypertension Neg Hx    Colon cancer Neg Hx    Colon polyps Neg Hx    Esophageal cancer Neg Hx    Rectal cancer Neg Hx    Stomach cancer Neg Hx    Social History  Socioeconomic History   Marital status: Married    Spouse name: Not on file   Number of children: Not on file   Years of education: Not on file   Highest education level: Not on file  Occupational History   Occupation: Disabled  Tobacco Use   Smoking status: Former    Packs/day: 1.50    Years: 4.00    Additional pack years: 0.00    Total pack years: 6.00    Types: Cigarettes    Quit date: 01/06/1976    Years since quitting: 46.3   Smokeless tobacco: Never   Tobacco comments:    smoked 35 years ago as of 2013   Vaping Use   Vaping Use: Never used  Substance and Sexual Activity   Alcohol use: No   Drug use: No   Sexual activity: Yes    Birth control/protection: None  Other Topics Concern   Not on file  Social History Narrative   Not on file   Social Determinants of Health   Financial Resource Strain: Low Risk  (05/14/2022)   Overall Financial Resource Strain (CARDIA)    Difficulty of Paying Living Expenses: Not hard at all  Food Insecurity: No Food Insecurity (05/14/2022)   Hunger Vital Sign    Worried About Running Out of Food in the Last Year: Never true    Ran Out of Food in the Last Year: Never true  Transportation Needs: No Transportation Needs (10/13/2021)   PRAPARE - Administrator, Civil Service (Medical): No    Lack of Transportation (Non-Medical): No  Physical Activity: Insufficiently Active (05/14/2022)   Exercise Vital Sign    Days of Exercise per Week: 2 days    Minutes of Exercise per Session: 30 min  Stress: No Stress Concern Present (05/14/2022)   [employer] of Occupational Health -  Occupational Stress Questionnaire    Feeling of Stress : Not at all  Social Connections: Socially Integrated (05/14/2022)   Social Connection and Isolation Panel [NHANES]    Frequency of Communication with Friends and Family: More than three times a week    Frequency of Social Gatherings with Friends and Family: More than three times a week    Attends Religious Services: More than 4 times per year    Active Member of [employer] or Organizations: Yes    Attends Engineer, structural: More than 4 times per year    Marital Status: Married    Tobacco Counseling Counseling given: Not Answered Tobacco comments: smoked 35 years ago as of 2013    Clinical Intake:  Pre-visit preparation completed: No  Pain : No/denies pain  Nutrition Risk Assessment:  Has the patient had any N/V/D within the last 2 months?  No  Does the patient have any non-healing wounds?  No  Has the patient had any unintentional weight loss or weight gain?  No   Diabetes:  Is the patient diabetic?  Yes  If diabetic, was a CBG obtained today?  No  Did the patient bring in their glucometer from home?  No  How often do you monitor your CBG's? Twice Weekly.   Financial Strains and Diabetes Management:  Are you having any financial strains with the device, your supplies or your medication? No .  Does the patient want to be seen by Chronic Care Management for management of their diabetes?  No  Would the patient like to be referred to a Nutritionist or for Diabetic Management?  No   Diabetic  Exams:  Diabetic Eye Exam: Completed . Overdue for diabetic eye exam. Pt has been advised about the importance in completing this exam. A referral has been placed today. Message sent to referral coordinator for scheduling purposes. Advised pt to expect a call from office referred to regarding appt.  Diabetic Foot Exam: Completed . Pt has been advised about the importance in completing this exam. Pt is scheduled for diabetic foot  exam on Followed by PCP.     BMI - recorded: 33.19 Nutritional Status: BMI > 30  Obese Nutritional Risks: None Diabetes: Yes CBG done?: No Did pt. bring in CBG monitor from home?: No  How often do you need to have someone help you when you read instructions, pamphlets, or other written materials from your doctor or pharmacy?: 1 - Never  Diabetic?  Yes  Interpreter Needed?: No  Information entered by :: Theresa Mulligan LPN   Activities of Daily Living    05/14/2022    2:49 PM 01/08/2022    1:50 PM  In your present state of health, do you have any difficulty performing the following activities:  Hearing? 1 1  Comment Wearshearing aids   Vision? 0 0  Difficulty concentrating or making decisions? 0 0  Walking or climbing stairs? 0 0  Dressing or bathing? 0 0  Doing errands, shopping? 0 0  Preparing Food and eating ? N   Using the Toilet? N   In the past six months, have you accidently leaked urine? N   Do you have problems with loss of bowel control? N   Managing your Medications? N   Managing your Finances? N   Housekeeping or managing your Housekeeping? N     Patient Care Team: Mayer Masker, PA-C (Inactive) as PCP - General Jethro Bolus, MD as Consulting Physician (Ophthalmology) Shirlean Kelly, MD as Consulting Physician (Neurosurgery) Ihor Gully, MD (Inactive) as Consulting Physician (Urology) Rachael Fee, MD as Attending Physician (Gastroenterology) Coralyn Helling, MD as Consulting Physician (Pulmonary Disease) Valeria Batman, MD (Inactive) as Consulting Physician (Orthopedic Surgery) Lenda Kelp, MD as Consulting Physician (Sports Medicine) Annie Sable, MD as Consulting Physician (Nephrology) Corliss Skains, MD as Consulting Physician (Cardiothoracic Surgery) Rosalio Macadamia, NP (Inactive) as Nurse Practitioner (Nurse Practitioner) Lyn Records, MD (Inactive) as Consulting Physician (Cardiology)  Indicate any recent Medical  Services you may have received from other than [facility] providers in the past year (date may be approximate).     Assessment:   This is a routine wellness examination for Chrissie Noa.  Hearing/Vision screen Hearing Screening - Comments:: Wears hearing aids Vision Screening - Comments:: Wears rx glasses - up to date with routine eye exams with  Dr Nile Riggs  Dietary issues and exercise activities discussed: Current Exercise Habits: Home exercise routine, Type of exercise: walking, Time (Minutes): 30, Frequency (Times/Week): 3, Weekly Exercise (Minutes/Week): 90, Intensity: Moderate, Exercise limited by: None identified   Goals Addressed               This Visit's Progress     No current goals (pt-stated)         Depression Screen    05/14/2022    2:49 PM 01/08/2022    1:49 PM 10/13/2021   10:36 AM 05/23/2021    8:38 AM 01/22/2021    9:22 AM 10/21/2020    3:28 PM 05/21/2020   10:11 AM  PHQ 2/9 Scores  PHQ - 2 Score 0 1 0 0 0 1 0  PHQ- 9 Score  3   3 2 1     Fall Risk    05/14/2022    2:51 PM 01/08/2022    1:51 PM 05/23/2021    8:38 AM 01/22/2021    9:22 AM 10/21/2020    2:57 PM  Fall Risk   Falls in the past year? 0 0 0 0 1  Number falls in past yr: 0 0 0 0 1  Injury with Fall? 0 0 0 0 0  Risk for fall due to : No Fall Risks  No Fall Risks No Fall Risks No Fall Risks;History of fall(s);Impaired balance/gait  Follow up Falls prevention discussed  Falls evaluation completed Falls evaluation completed Falls evaluation completed    FALL RISK PREVENTION PERTAINING TO THE HOME:  Any stairs in or around the home? Yes  If so, are there any without handrails? No  Home free of loose throw rugs in walkways, pet beds, electrical cords, etc? Yes  Adequate lighting in your home to reduce risk of falls? Yes   ASSISTIVE DEVICES UTILIZED TO PREVENT FALLS:  Life alert? No  Use of a cane, walker or w/c? No  Grab bars in the bathroom? No  Shower chair or bench in shower? No  Elevated toilet seat  or a handicapped toilet? Yes  TIMED UP AND GO:  Was the test performed? No . Audio Visit  Cognitive Function:        10/21/2020    2:57 PM  6CIT Screen  What Year? 0 points  What month? 0 points  What time? 0 points  Count back from 20 0 points  Months in reverse 2 points  Repeat phrase 4 points  Total Score 6 points    Immunizations Immunization History  Administered Date(s) Administered   Influenza Split 12/10/2010   Influenza,inj,Quad PF,6+ Mos 10/19/2012, 11/23/2014, 10/22/2015, 11/10/2016, 11/08/2017   Influenza,inj,quad, With Preservative 11/10/2016   Influenza-Unspecified 09/25/2018   PFIZER(Purple Top)SARS-COV-2 Vaccination 03/08/2019, 04/08/2019, 12/12/2019   Pneumococcal Conjugate-13 12/07/2014   Pneumococcal Polysaccharide-23 04/17/2016   Td 08/02/2008   Tdap 05/21/2020    TDAP status: Up to date  Flu Vaccine status: Up to date  Pneumococcal vaccine status: Due, Education has been provided regarding the importance of this vaccine. Advised may receive this vaccine at local pharmacy or Health Dept. Aware to provide a copy of the vaccination record if obtained from local pharmacy or Health Dept. Verbalized acceptance and understanding.  Covid-19 vaccine status: Completed vaccines  Qualifies for Shingles Vaccine? Yes   Zostavax completed No   Shingrix Completed?: No.    Education has been provided regarding the importance of this vaccine. Patient has been advised to call insurance company to determine out of pocket expense if they have not yet received this vaccine. Advised may also receive vaccine at local pharmacy or Health Dept. Verbalized acceptance and understanding.  Screening Tests Health Maintenance  Topic Date Due   FOOT EXAM  05/27/2021   Diabetic kidney evaluation - Urine ACR  05/15/2022 (Originally 05/21/2021)   COVID-19 Vaccine (4 - 2023-24 season) 05/30/2022 (Originally 09/05/2021)   Zoster Vaccines- Shingrix (1 of 2) 08/14/2022 (Originally  11/20/1974)   Pneumonia Vaccine 19+ Years old (3 of 3 - PPSV23 or PCV20) 05/14/2023 (Originally 04/17/2021)   HEMOGLOBIN A1C  07/09/2022   INFLUENZA VACCINE  08/06/2022   OPHTHALMOLOGY EXAM  08/08/2022   Diabetic kidney evaluation - eGFR measurement  03/26/2023   Medicare Annual Wellness (AWV)  05/14/2023   COLONOSCOPY (Pts 45-41yrs Insurance coverage will need to be confirmed)  10/23/2027   DTaP/Tdap/Td (3 - Td or Tdap) 05/22/2030   Hepatitis C Screening  Completed   HPV VACCINES  Aged Out    Health Maintenance  Health Maintenance Due  Topic Date Due   FOOT EXAM  05/27/2021    Colorectal cancer screening: Type of screening: Colonoscopy. Completed 10/22/20. Repeat every 7 years  Lung Cancer Screening: (Low Dose CT Chest recommended if Age 52-80 years, 30 pack-year currently smoking OR have quit w/in 15years.) does not qualify.    Additional Screening:  Hepatitis C Screening: does qualify; Completed 5/17.22  Vision Screening: Recommended annual ophthalmology exams for early detection of glaucoma and other disorders of the eye. Is the patient up to date with their annual eye exam?  Yes  Who is the provider or what is the name of the office in which the patient attends annual eye exams? Dr Nile Riggs If pt is not established with a provider, would they like to be referred to a provider to establish care? No .   Dental Screening: Recommended annual dental exams for proper oral hygiene  Community Resource Referral / Chronic Care Management:  CRR required this visit?  No   CCM required this visit?  No      Plan:     I have personally reviewed and noted the following in the patient's chart:   Medical and social history Use of alcohol, tobacco or illicit drugs  Current medications and supplements including opioid prescriptions. Patient is not currently taking opioid prescriptions. Functional ability and status Nutritional status Physical activity Advanced directives List  of other physicians Hospitalizations, surgeries, and ER visits in previous 12 months Vitals Screenings to include cognitive, depression, and falls Referrals and appointments  In addition, I have reviewed and discussed with patient certain preventive protocols, quality metrics, and best practice recommendations. A written personalized care plan for preventive services as well as general preventive health recommendations were provided to patient.     [name] Rung, LPN   01/10/1094   Nurse Notes: Patient due Diabetic kidney evaluation-Urine ACR

## 2022-05-14 NOTE — Patient Instructions (Addendum)
[name] , Thank you for taking time to come for your Medicare Wellness Visit. I appreciate your ongoing commitment to your health goals. Please review the following plan we discussed and let me know if I can assist you in the future.   These are the goals we discussed:  Goals       Lose 10 lbs by next year.   (pt-stated)      Maintain current state of health.   (pt-stated)      No current goals (pt-stated)        This is a list of the screening recommended for you and due dates:  Health Maintenance  Topic Date Due   Complete foot exam   05/27/2021   Yearly kidney health urinalysis for diabetes  05/15/2022*   COVID-19 Vaccine (4 - 2023-24 season) 05/30/2022*   Zoster (Shingles) Vaccine (1 of 2) 08/14/2022*   Pneumonia Vaccine (3 of 3 - PPSV23 or PCV20) 05/14/2023*   Hemoglobin A1C  07/09/2022   Flu Shot  08/06/2022   Eye exam for diabetics  08/08/2022   Yearly kidney function blood test for diabetes  03/26/2023   Medicare Annual Wellness Visit  05/14/2023   Colon Cancer Screening  10/23/2027   DTaP/Tdap/Td vaccine (3 - Td or Tdap) 05/22/2030   Hepatitis C Screening: USPSTF Recommendation to screen - Ages 12-79 yo.  Completed   HPV Vaccine  Aged Out  *Topic was postponed. The date shown is not the original due date.    Advanced directives: Advance directive discussed with you today. Even though you declined this today, please call our office should you change your mind, and we can give you the proper paperwork for you to fill out.   Conditions/risks identified: None  Next appointment: Follow up in one year for your annual wellness visit.    Preventive Care 14 Years and Older, Male  Preventive care refers to lifestyle choices and visits with your health care provider that can promote health and wellness. What does preventive care include? A yearly physical exam. This is also called an annual well check. Dental exams once or twice a year. Routine eye exams. Ask your health  care provider how often you should have your eyes checked. Personal lifestyle choices, including: Daily care of your teeth and gums. Regular physical activity. Eating a healthy diet. Avoiding tobacco and drug use. Limiting alcohol use. Practicing safe sex. Taking low doses of aspirin every day. Taking vitamin and mineral supplements as recommended by your health care provider. What happens during an annual well check? The services and screenings done by your health care provider during your annual well check will depend on your age, overall health, lifestyle risk factors, and family history of disease. Counseling  Your health care provider may ask you questions about your: Alcohol use. Tobacco use. Drug use. Emotional well-being. Home and relationship well-being. Sexual activity. Eating habits. History of falls. Memory and ability to understand (cognition). Work and work Astronomer. Screening  You may have the following tests or measurements: Height, weight, and BMI. Blood pressure. Lipid and cholesterol levels. These may be checked every 5 years, or more frequently if you are over 79 years old. Skin check. Lung cancer screening. You may have this screening every year starting at age 70 if you have a 30-pack-year history of smoking and currently smoke or have quit within the past 15 years. Fecal occult blood test (FOBT) of the stool. You may have this test every year starting at age  50. Flexible sigmoidoscopy or colonoscopy. You may have a sigmoidoscopy every 5 years or a colonoscopy every 10 years starting at age 66. Prostate cancer screening. Recommendations will vary depending on your family history and other risks. Hepatitis C blood test. Hepatitis B blood test. Sexually transmitted disease (STD) testing. Diabetes screening. This is done by checking your blood sugar (glucose) after you have not eaten for a while (fasting). You may have this done every 1-3 years. Abdominal  aortic aneurysm (AAA) screening. You may need this if you are a current or former smoker. Osteoporosis. You may be screened starting at age 25 if you are at high risk. Talk with your health care provider about your test results, treatment options, and if necessary, the need for more tests. Vaccines  Your health care provider may recommend certain vaccines, such as: Influenza vaccine. This is recommended every year. Tetanus, diphtheria, and acellular pertussis (Tdap, Td) vaccine. You may need a Td booster every 10 years. Zoster vaccine. You may need this after age 47. Pneumococcal 13-valent conjugate (PCV13) vaccine. One dose is recommended after age 80. Pneumococcal polysaccharide (PPSV23) vaccine. One dose is recommended after age 42. Talk to your health care provider about which screenings and vaccines you need and how often you need them. This information is not intended to replace advice given to you by your health care provider. Make sure you discuss any questions you have with your health care provider. Document Released: 01/18/2015 Document Revised: 09/11/2015 Document Reviewed: 10/23/2014 Elsevier Interactive Patient Education  2017 [employer].  Fall Prevention in the Home Falls can cause injuries. They can happen to people of all ages. There are many things you can do to make your home safe and to help prevent falls. What can I do on the outside of my home? Regularly fix the edges of walkways and driveways and fix any cracks. Remove anything that might make you trip as you walk through a door, such as a raised step or threshold. Trim any bushes or trees on the path to your home. Use bright outdoor lighting. Clear any walking paths of anything that might make someone trip, such as rocks or tools. Regularly check to see if handrails are loose or broken. Make sure that both sides of any steps have handrails. Any raised decks and porches should have guardrails on the edges. Have any  leaves, snow, or ice cleared regularly. Use sand or salt on walking paths during winter. Clean up any spills in your garage right away. This includes oil or grease spills. What can I do in the bathroom? Use night lights. Install grab bars by the toilet and in the tub and shower. Do not use towel bars as grab bars. Use non-skid mats or decals in the tub or shower. If you need to sit down in the shower, use a plastic, non-slip stool. Keep the floor dry. Clean up any water that spills on the floor as soon as it happens. Remove soap buildup in the tub or shower regularly. Attach bath mats securely with double-sided non-slip rug tape. Do not have throw rugs and other things on the floor that can make you trip. What can I do in the bedroom? Use night lights. Make sure that you have a light by your bed that is easy to reach. Do not use any sheets or blankets that are too big for your bed. They should not hang down onto the floor. Have a firm chair that has side arms. You can use  this for support while you get dressed. Do not have throw rugs and other things on the floor that can make you trip. What can I do in the kitchen? Clean up any spills right away. Avoid walking on wet floors. Keep items that you use a lot in easy-to-reach places. If you need to reach something above you, use a strong step stool that has a grab bar. Keep electrical cords out of the way. Do not use floor polish or wax that makes floors slippery. If you must use wax, use non-skid floor wax. Do not have throw rugs and other things on the floor that can make you trip. What can I do with my stairs? Do not leave any items on the stairs. Make sure that there are handrails on both sides of the stairs and use them. Fix handrails that are broken or loose. Make sure that handrails are as long as the stairways. Check any carpeting to make sure that it is firmly attached to the stairs. Fix any carpet that is loose or worn. Avoid  having throw rugs at the top or bottom of the stairs. If you do have throw rugs, attach them to the floor with carpet tape. Make sure that you have a light switch at the top of the stairs and the bottom of the stairs. If you do not have them, ask someone to add them for you. What else can I do to help prevent falls? Wear shoes that: Do not have high heels. Have rubber bottoms. Are comfortable and fit you well. Are closed at the toe. Do not wear sandals. If you use a stepladder: Make sure that it is fully opened. Do not climb a closed stepladder. Make sure that both sides of the stepladder are locked into place. Ask someone to hold it for you, if possible. Clearly mark and make sure that you can see: Any grab bars or handrails. First and last steps. Where the edge of each step is. Use tools that help you move around (mobility aids) if they are needed. These include: Canes. Walkers. Scooters. Crutches. Turn on the lights when you go into a dark area. Replace any light bulbs as soon as they burn out. Set up your furniture so you have a clear path. Avoid moving your furniture around. If any of your floors are uneven, fix them. If there are any pets around you, be aware of where they are. Review your medicines with your doctor. Some medicines can make you feel dizzy. This can increase your chance of falling. Ask your doctor what other things that you can do to help prevent falls. This information is not intended to replace advice given to you by your health care provider. Make sure you discuss any questions you have with your health care provider. Document Released: 10/18/2008 Document Revised: 05/30/2015 Document Reviewed: 01/26/2014 Elsevier Interactive Patient Education  2017 [employer].

## 2022-05-19 ENCOUNTER — Encounter (HOSPITAL_BASED_OUTPATIENT_CLINIC_OR_DEPARTMENT_OTHER): Payer: Self-pay | Admitting: Pulmonary Disease

## 2022-05-19 ENCOUNTER — Ambulatory Visit (HOSPITAL_BASED_OUTPATIENT_CLINIC_OR_DEPARTMENT_OTHER): Payer: PPO | Admitting: Pulmonary Disease

## 2022-05-19 VITALS — BP 120/72 | HR 59 | Temp 98.4°F | Ht 71.0 in | Wt 228.4 lb

## 2022-05-19 DIAGNOSIS — G4733 Obstructive sleep apnea (adult) (pediatric): Secondary | ICD-10-CM

## 2022-05-19 NOTE — Progress Notes (Signed)
[facility]  Chief Complaint  Patient presents with   Follow-up    Follow up. Patient has no complaints.     Past Surgical History:  He  has a past surgical history that includes Finger amputation (2003); Cervical fusion; Lumbar fusion; Esophageal dilation (2007); Colonoscopy (2007); Carpal tunnel release (09/10/2015); CORONARY ANGIOGRAPHY (N/A, 10/25/2018); Coronary artery bypass graft (N/A, 10/26/2018); Cardiac catheterization; and Umbilical hernia repair (N/A, 04/03/2020).  Past Medical History:  Carpal tunnel, CAD s/p CABG, DM, GERD, Gout, Fatty liver, HLD, HTN  Constitutional:  BP 120/72 (BP Location: Right Arm, Patient Position: Sitting, Cuff Size: Large)   Pulse (!) 59   Temp 98.4 F (36.9 C) (Oral)   Ht 5\' 11"  (1.803 m)   Wt 228 lb 6.4 oz (103.6 kg)   SpO2 95%   BMI 31.86 kg/m   Brief Summary:  [name] Banner Sr. is a 67 y.o. male former smoker with obstructive sleep apnea.      Subjective:   He is here with his wife.  Uses CPAP nightly.  Uses a full face mask and this is comfortable.  He has no trouble falling asleep.  He wakes up with air leak and dryness.  He feels like the pressure runs too high sometimes.    Physical Exam:   Appearance - well kempt   ENMT - no sinus tenderness, no oral exudate, no LAN, Mallampati 4 airway, no stridor  Respiratory - equal breath sounds bilaterally, no wheezing or rales  CV - s1s2 regular rate and rhythm, no murmurs  Ext - no clubbing, no edema  Skin - no rashes  Psych - normal mood and affect    Sleep Tests:  PSG 03/08/03 >> AHI 33 Auto CPAP 04/19/22 to 05/18/22 >> used on 30 of 30 nights with average 8 hrs 2 min.  Average AHI 9 with median CPAP 13 and 95 th percentile CPAP 15 cm H2O.  Air leak noted.  Social History:  He  reports that he quit smoking about 46 years ago. His smoking use included cigarettes. He has a 6.00 pack-year smoking history. He has never used  smokeless tobacco. He reports that he does not drink alcohol and does not use drugs.  Family History:  His family history includes Aneurysm (age of onset: 35) in his mother; Cancer in his maternal uncle and paternal uncle; Diabetes in his sister; Heart attack in his father; Heart disease in his father, sister, and sister; Kidney disease in his brother; Sudden death in his mother; Urolithiasis in his father.     Assessment/Plan:   Obstructive sleep apnea. - he is compliant with CPAP and reports benefit from therapy - he uses [employer] for his DME - his current CPAP is more than 68 years old - will arrange for a new Resmed auto CPAP and change setting to 10 - 14 cm H2O - if he still has trouble with air leak, then he would need refitting of his mask   Obesity. - encouraged him to keep up with weight loss efforts  Time Spent Involved in Patient Care on Day of Examination:  18 minutes  Follow up:   Patient Instructions  Will arrange for a new Resmed auto CPAP machine  Follow up in 4 months  Medication List:   Allergies as of 05/19/2022       Reactions   Prednisone Other (See Comments)   Caused blood sugar to increase, pt will not take  Medication List        Accurate as of May 19, 2022 11:29 AM. If you have any questions, ask your nurse or doctor.          acetaminophen 650 MG CR tablet Commonly known as: TYLENOL Take 1,300 mg by mouth every 8 (eight) hours as needed for pain.   allopurinol 300 MG tablet Commonly known as: ZYLOPRIM Take 0.5 tablets (150 mg total) by mouth daily.   aspirin 81 MG tablet Take 81 mg by mouth daily.   atorvastatin 80 MG tablet Commonly known as: LIPITOR TAKE 1 TABLET BY MOUTH EACH NIGHT AT BEDTIME   benazepril 10 MG tablet Commonly known as: LOTENSIN Take 1 tablet (10 mg total) by mouth daily.   blood glucose meter kit and supplies Dispense based on patient and insurance preference. Use to check fasting blood glucose in  the morning and to check glucose 2 hours after largest meal of the day. (FOR ICD-10 E10.9, E11.9).   empagliflozin 10 MG Tabs tablet Commonly known as: Jardiance Take 1 tablet (10 mg total) by mouth daily before breakfast.   FLUoxetine 40 MG capsule Commonly known as: PROZAC Take 1 capsule (40 mg total) by mouth daily.   gabapentin 800 MG tablet Commonly known as: NEURONTIN TAKE 1 TABLET BY MOUTH 3 TIMES DAILY   glipiZIDE 5 MG tablet Commonly known as: GLUCOTROL TAKE 1 TABLET BY MOUTH TWICE DAILY BEFORE A MEAL   Lancets Ultra Fine Misc Use to check fasting blood glucose in the morning and to check glucose 2 hours after largest meal of the day   loratadine 10 MG tablet Commonly known as: CLARITIN Take 10 mg by mouth daily.   Melatonin 10 MG Caps Take 10 mg by mouth at bedtime.   metoprolol succinate 25 MG 24 hr tablet Commonly known as: TOPROL-XL Take 0.5 tablets (12.5 mg total) by mouth daily.   multivitamins with iron Tabs tablet Take 1 tablet by mouth daily.   pantoprazole 40 MG tablet Commonly known as: PROTONIX Take 1 tablet (40 mg total) by mouth daily.   polyethylene glycol powder 17 GM/SCOOP powder Commonly known as: GLYCOLAX/MIRALAX USE AS DIRECTED What changed:  how much to take how to take this when to take this   sildenafil 100 MG tablet Commonly known as: VIAGRA Take by mouth as needed for erectile dysfunction.   vitamin C 1000 MG tablet Take 1,000 mg by mouth daily.   Vitamin D 50 MCG (2000 UT) tablet Take 2,000 Units by mouth daily.        Signature:  Coralyn Helling, MD [facility] Pulmonary/Critical Care Pager - [phone] 05/19/2022, 11:29 AM

## 2022-05-19 NOTE — Patient Instructions (Signed)
Will arrange for a new Resmed auto CPAP machine  Follow up in 4 months

## 2022-06-05 DIAGNOSIS — H903 Sensorineural hearing loss, bilateral: Secondary | ICD-10-CM | POA: Diagnosis not present

## 2022-06-09 ENCOUNTER — Other Ambulatory Visit: Payer: Self-pay | Admitting: Nurse Practitioner

## 2022-06-09 DIAGNOSIS — E1129 Type 2 diabetes mellitus with other diabetic kidney complication: Secondary | ICD-10-CM

## 2022-06-22 ENCOUNTER — Other Ambulatory Visit: Payer: Self-pay | Admitting: Family Medicine

## 2022-06-23 ENCOUNTER — Ambulatory Visit (INDEPENDENT_AMBULATORY_CARE_PROVIDER_SITE_OTHER): Payer: PPO | Admitting: Family Medicine

## 2022-06-23 ENCOUNTER — Encounter: Payer: Self-pay | Admitting: Family Medicine

## 2022-06-23 VITALS — BP 145/87 | HR 57 | Temp 97.8°F | Ht 71.0 in | Wt 229.5 lb

## 2022-06-23 DIAGNOSIS — E1122 Type 2 diabetes mellitus with diabetic chronic kidney disease: Secondary | ICD-10-CM | POA: Diagnosis not present

## 2022-06-23 DIAGNOSIS — E1169 Type 2 diabetes mellitus with other specified complication: Secondary | ICD-10-CM | POA: Diagnosis not present

## 2022-06-23 DIAGNOSIS — Z7984 Long term (current) use of oral hypoglycemic drugs: Secondary | ICD-10-CM | POA: Diagnosis not present

## 2022-06-23 DIAGNOSIS — I152 Hypertension secondary to endocrine disorders: Secondary | ICD-10-CM | POA: Diagnosis not present

## 2022-06-23 DIAGNOSIS — N184 Chronic kidney disease, stage 4 (severe): Secondary | ICD-10-CM

## 2022-06-23 DIAGNOSIS — E1121 Type 2 diabetes mellitus with diabetic nephropathy: Secondary | ICD-10-CM | POA: Diagnosis not present

## 2022-06-23 DIAGNOSIS — E785 Hyperlipidemia, unspecified: Secondary | ICD-10-CM | POA: Diagnosis not present

## 2022-06-23 DIAGNOSIS — E1129 Type 2 diabetes mellitus with other diabetic kidney complication: Secondary | ICD-10-CM | POA: Diagnosis not present

## 2022-06-23 DIAGNOSIS — E1159 Type 2 diabetes mellitus with other circulatory complications: Secondary | ICD-10-CM

## 2022-06-23 DIAGNOSIS — M109 Gout, unspecified: Secondary | ICD-10-CM | POA: Diagnosis not present

## 2022-06-23 MED ORDER — GLIPIZIDE 5 MG PO TABS
5.0000 mg | ORAL_TABLET | Freq: Two times a day (BID) | ORAL | 0 refills | Status: DC
Start: 2022-06-23 — End: 2022-09-24

## 2022-06-23 MED ORDER — ALLOPURINOL 300 MG PO TABS: 150.0000 mg | ORAL_TABLET | Freq: Every day | ORAL | 1 refills | Status: DC

## 2022-06-23 NOTE — Assessment & Plan Note (Signed)
Last lipid panel: LDL 47, HDL 40, triglycerides 90.  At goal.  Repeat lipid panel and CMP today.  Continue atorvastatin 80 mg daily.  Will continue to monitor.

## 2022-06-23 NOTE — Assessment & Plan Note (Addendum)
BP goal <130/80.  Above goal initially 145/89 and on repeat 145/87.  Discussed the importance of medication compliance, low-sodium diet, and routine physical activity.  Continue benazepril 10 mg daily, metoprolol 12.5 mg daily.  At next appointment, if still above goal will adjust medication regimen.  Encouraged ambulatory blood pressure monitoring.  Will continue to monitor.  Repeating CMP.

## 2022-06-23 NOTE — Assessment & Plan Note (Addendum)
Last A1c 1 year ago was 7.1.  Repeating A1c with lab work today.  Continue glipizide 5 mg twice daily before meals, Jardiance 10 mg daily.  Will continue to monitor. Foot exam completed today.  Unable to collect urine sample, will collect for urine microalbumin at next follow-up.

## 2022-06-23 NOTE — Patient Instructions (Addendum)
It was nice to meet you today! Keep up the good work.   OTHER PREVENTATIVE CARE: -Due for PCV20 pneumonia vaccine (1 dose): request at pharmacy -Due for Shingles vaccine series (2 doses): request at pharmacy

## 2022-06-23 NOTE — Addendum Note (Signed)
Addended by: Saralyn Pilar on: 06/23/2022 01:25 PM   Modules accepted: Level of Service

## 2022-06-23 NOTE — Assessment & Plan Note (Signed)
Followed by nephrology.  Most recent EGFR on 03/26/2022 was 44.  Continue good glycemic and blood pressure control.

## 2022-06-23 NOTE — Progress Notes (Signed)
Established Patient Office Visit  Subjective   Patient ID: [name] Overton Sr., male    DOB: April 19, 1955  Age: 67 y.o. MRN: [id number]  Chief Complaint  Patient presents with   Diabetes    HPI [name] Bonsall Sr. is a 67 y.o. male presenting today for follow up of HTN, HLD, DM. Hypertension: Patient here for follow-up of elevated blood pressure. He is exercising and is adherent to low salt diet.   Pt denies chest pain, SOB, dizziness, edema, syncope, fatigue or heart palpitations. Taking benazepril, metoprolol, reports excellent compliance with treatment. Denies side effects. Hyperlipidemia: tolerating atorvastatin well with no myalgias or significant side effects.  The ASCVD Risk score (Arnett DK, et al., 2019) failed to calculate for the following reasons:   The valid total cholesterol range is 130 to 320 mg/dL Diabetes: denies hypoglycemic events, wounds or sores that are not healing well, increased thirst or urination. Denies vision problems, eye exam 08/07/2021.  Taking glipizide, Jardiance as prescribed without any side effects.   ROS Negative unless otherwise noted in HPI   Objective:     BP (!) 145/87   Pulse (!) 57   Temp 97.8 F (36.6 C) (Oral)   Ht 5\' 11"  (1.803 m)   Wt 229 lb 8 oz (104.1 kg)   SpO2 95%   BMI 32.01 kg/m   Physical Exam Constitutional:      General: He is not in acute distress.    Appearance: Normal appearance.  HENT:     Head: Normocephalic and atraumatic.  Cardiovascular:     Rate and Rhythm: Normal rate and regular rhythm.     Pulses: Normal pulses.     Heart sounds: Normal heart sounds. No murmur heard.    No friction rub. No gallop.  Pulmonary:     Effort: Pulmonary effort is normal. No respiratory distress.     Breath sounds: Normal breath sounds. No wheezing, rhonchi or rales.  Skin:    General: Skin is warm and dry.  Neurological:     Mental Status: He is alert and oriented to person, place, and time.  Psychiatric:        Mood and  Affect: Mood normal.    Diabetic Foot Form - Detailed   Diabetic Foot Exam - detailed Diabetic Foot exam was performed with the following findings: Yes 06/23/2022  8:59 AM  Visual Foot Exam completed.: Yes  Can the patient see the bottom of their feet?: Yes Are the shoes appropriate in style and fit?: Yes Is there swelling or and abnormal foot shape?: No Is there a claw toe deformity?: No Is there elevated skin temparature?: No Is there foot or ankle muscle weakness?: No Normal Range of Motion: Yes Pulse Foot Exam completed.: Yes   Right posterior Tibialias: Present Left posterior Tibialias: Present   Right Dorsalis Pedis: Present Left Dorsalis Pedis: Present  Sensory Foot Exam Completed.: Yes Semmes-Weinstein Monofilament Test R Site 1-Great Toe: Pos L Site 1-Great Toe: Pos          Assessment & Plan:  Hypertension associated with diabetes (HCC) Assessment & Plan: BP goal <130/80.  Above goal initially 145/89 and on repeat 145/87.  Discussed the importance of medication compliance, low-sodium diet, and routine physical activity.  Continue benazepril 10 mg daily, metoprolol 12.5 mg daily.  At next appointment, if still above goal will adjust medication regimen.  Encouraged ambulatory blood pressure monitoring.  Will continue to monitor.  Repeating CMP.  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  Repeating A1c with lab work today.  Continue glipizide 5 mg twice daily before meals, Jardiance 10 mg daily.  Will continue to monitor. Foot exam completed today.  Unable to collect urine sample, will collect for urine microalbumin at next follow-up.  Orders: -     Hemoglobin A1c; Future -     glipiZIDE; Take 1 tablet (5 mg total) by mouth 2 (two) times daily before a meal.   Dispense: 180 tablet; Refill: 0  Hyperlipidemia associated with type 2 diabetes mellitus (HCC) Assessment & Plan: Last lipid panel: LDL 47, HDL 40, triglycerides 90.  At goal.  Repeat lipid panel and CMP today.  Continue atorvastatin 80 mg daily.  Will continue to monitor.  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  Most recent EGFR on 03/26/2022 was 44.  Continue good glycemic and blood pressure control.   Gout, unspecified cause, unspecified chronicity, unspecified site -     Allopurinol; Take 0.5 tablets (150 mg total) by mouth daily.  Dispense: 45 tablet; Refill: 1    Return in about 3 months (around 09/23/2022) for follow-up for HTN, HLD, DM, fasting blood work 1 week before.    Melida Quitter, PA

## 2022-06-24 ENCOUNTER — Other Ambulatory Visit: Payer: Self-pay | Admitting: Family Medicine

## 2022-06-24 DIAGNOSIS — E1159 Type 2 diabetes mellitus with other circulatory complications: Secondary | ICD-10-CM

## 2022-06-24 DIAGNOSIS — N183 Chronic kidney disease, stage 3 unspecified: Secondary | ICD-10-CM

## 2022-06-24 DIAGNOSIS — E1129 Type 2 diabetes mellitus with other diabetic kidney complication: Secondary | ICD-10-CM

## 2022-06-24 LAB — HEMOGLOBIN A1C
Est. average glucose Bld gHb Est-mCnc: 163 mg/dL
Hgb A1c MFr Bld: 7.3 % — ABNORMAL HIGH (ref 4.8–5.6)

## 2022-06-24 LAB — COMPREHENSIVE METABOLIC PANEL
ALT: 24 IU/L (ref 0–44)
AST: 28 IU/L (ref 0–40)
Albumin: 4.6 g/dL (ref 3.9–4.9)
Alkaline Phosphatase: 130 IU/L — ABNORMAL HIGH (ref 44–121)
BUN/Creatinine Ratio: 14 (ref 10–24)
BUN: 28 mg/dL — ABNORMAL HIGH (ref 8–27)
Bilirubin Total: 0.3 mg/dL (ref 0.0–1.2)
CO2: 23 mmol/L (ref 20–29)
Calcium: 10 mg/dL (ref 8.6–10.2)
Chloride: 103 mmol/L (ref 96–106)
Creatinine, Ser: 1.96 mg/dL — ABNORMAL HIGH (ref 0.76–1.27)
Globulin, Total: 3.2 g/dL (ref 1.5–4.5)
Glucose: 107 mg/dL — ABNORMAL HIGH (ref 70–99)
Potassium: 5.4 mmol/L — ABNORMAL HIGH (ref 3.5–5.2)
Sodium: 140 mmol/L (ref 134–144)
Total Protein: 7.8 g/dL (ref 6.0–8.5)
eGFR: 37 mL/min/{1.73_m2} — ABNORMAL LOW (ref >59)

## 2022-06-24 LAB — CBC WITH DIFFERENTIAL/PLATELET
Basophils Absolute: 0 10*3/uL (ref 0.0–0.2)
Basos: 1 %
EOS (ABSOLUTE): 0.1 10*3/uL (ref 0.0–0.4)
Eos: 3 %
Hematocrit: 42.8 % (ref 37.5–51.0)
Hemoglobin: 14.1 g/dL (ref 13.0–17.7)
Immature Grans (Abs): 0 10*3/uL (ref 0.0–0.1)
Immature Granulocytes: 0 %
Lymphocytes Absolute: 0.9 10*3/uL (ref 0.7–3.1)
Lymphs: 23 %
MCH: 27.3 pg (ref 26.6–33.0)
MCHC: 32.9 g/dL (ref 31.5–35.7)
MCV: 83 fL (ref 79–97)
Monocytes Absolute: 0.4 10*3/uL (ref 0.1–0.9)
Monocytes: 10 %
Neutrophils Absolute: 2.6 10*3/uL (ref 1.4–7.0)
Neutrophils: 63 %
Platelets: 251 10*3/uL (ref 150–450)
RBC: 5.16 x10E6/uL (ref 4.14–5.80)
RDW: 14.4 % (ref 11.6–15.4)
WBC: 4.1 10*3/uL (ref 3.4–10.8)

## 2022-06-24 LAB — LIPID PANEL
Chol/HDL Ratio: 4.1 ratio (ref 0.0–5.0)
Cholesterol, Total: 172 mg/dL (ref 100–199)
HDL: 42 mg/dL (ref >39)
LDL Chol Calc (NIH): 109 mg/dL — ABNORMAL HIGH (ref 0–99)
Triglycerides: 118 mg/dL (ref 0–149)
VLDL Cholesterol Cal: 21 mg/dL (ref 5–40)

## 2022-06-24 MED ORDER — BENAZEPRIL HCL 20 MG PO TABS
20.0000 mg | ORAL_TABLET | Freq: Every day | ORAL | 3 refills | Status: DC
Start: 2022-06-24 — End: 2023-08-03

## 2022-06-24 MED ORDER — EMPAGLIFLOZIN 25 MG PO TABS
25.0000 mg | ORAL_TABLET | Freq: Every day | ORAL | 2 refills | Status: DC
Start: 2022-06-24 — End: 2022-09-24

## 2022-07-06 ENCOUNTER — Other Ambulatory Visit: Payer: Self-pay | Admitting: Nurse Practitioner

## 2022-07-06 DIAGNOSIS — R12 Heartburn: Secondary | ICD-10-CM

## 2022-08-03 ENCOUNTER — Other Ambulatory Visit: Payer: Self-pay | Admitting: Nurse Practitioner

## 2022-08-03 DIAGNOSIS — F39 Unspecified mood [affective] disorder: Secondary | ICD-10-CM

## 2022-08-10 ENCOUNTER — Other Ambulatory Visit: Payer: Self-pay | Admitting: Family Medicine

## 2022-08-10 DIAGNOSIS — M109 Gout, unspecified: Secondary | ICD-10-CM

## 2022-09-01 NOTE — Progress Notes (Unsigned)
Office Visit    Patient Name: [name] Forbush Sr. Date of Encounter: 09/03/2022  Primary Care Provider:  Melida Quitter, PA Primary Cardiologist:  None Primary Electrophysiologist: None   Past Medical History    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   LIMA to LAD, SVG to D1 and SVG to OM2     Hyperlipidemia    Hypertension    Insomnia    Obstructive sleep apnea    OSA (obstructive sleep apnea)    Past Surgical History:  Procedure Laterality Date   CARDIAC CATHETERIZATION     CARPAL TUNNEL RELEASE  09/10/2015   RIGHT WRIST / WITH NERVE IMPINGEMENT SURGERY   CERVICAL FUSION     Dr Newell Coral   COLONOSCOPY  2007   negative;  GI   CORONARY ANGIOGRAPHY N/A 10/25/2018   Procedure: CORONARY ANGIOGRAPHY (CATH LAB);  Surgeon: Tonny Bollman, MD;  Location: [facility] INVASIVE CV LAB;  Service: Cardiovascular;  Laterality: N/A;   CORONARY ARTERY BYPASS GRAFT N/A 10/26/2018   Procedure: CORONARY ARTERY BYPASS GRAFTING (CABG) x3 using the LIMA to LAD and right greater saphenous vein via endoscopic harvest to the Diag 1 and OM 2.;  Surgeon: Corliss Skains, MD;  Location: [facility] OR;  Service: Open Heart Surgery;  Laterality: N/A;   ESOPHAGEAL DILATION  2007   FINGER AMPUTATION  2003   Left Index finger amputation & reattachment   LUMBAR FUSION      Dr Newell Coral   UMBILICAL HERNIA REPAIR N/A 04/03/2020   Procedure: HERNIA REPAIR UMBILICAL ADULT, open;  Surgeon: Henrene Dodge, MD;  Location: [facility] ORS;  Service: General;  Laterality: N/A;     Allergies  Allergies  Allergen Reactions   Prednisone Other (See Comments)    Caused blood sugar to increase, pt will not take     History of Present Illness    [name] Mudd Sr. is a 67 y.o. male with PMH of CAD s/p CABG x 3 (LIMA LIMA to LAD, SVG to D1 and SVG to OM 2), 10/2018, DM, HTN, HLD, CKD, OSA who presents today for follow-up of coronary artery disease.   [name] was seen initially in 2020 when he presented to the ED with 10 out of 10 chest pain.EKG showed SB with isolated TWI in lead III and 2D echo was completed that showed a normal EF with no RWMA. He had a LHC performed by Dr. Katrinka Blazing that showed severe complex distal left main disease extending into the LAD and left circumflex with patent nondominant RCA. CVTS was consulted and patient underwent CABG x 3 by Dr. Cliffton Asters on 10/26/2018.  He was last seen by Dr. Katrinka Blazing and 2022 and was doing well during that visit.  He was last seen in office on 02/2022 and blood pressures were elevated advised to monitor over next 2 weeks.  We discussed plans to increase Lotensin if blood pressures remain elevated.  Patient was also started back on Jardiance that time.  Since last being seen in the office patient reports that he has been doing well with no new cardiac complaints since previous follow-up.  His blood pressures are controlled today at 138/68 and heart rate is 72 bpm.  He was seen recently by his PCP and Lotensin was increased to 20 mg A1c was elevated 7.3 and Jardiance was increased to 25 mg.  He is compliant with his current medications and denies any adverse reactions.  He remains active and is working on walking and going to the gym.  He prepares majority of his foods and uses and abstains from excess salt and sugar.  In reviewing his medications he reported not taking metoprolol succinate 12.5 mg daily.  I will restart this medication and add to his current he denies chest pain, palpitations, dyspnea, PND, orthopnea, nausea,  vomiting, dizziness, syncope, edema, weight gain, or early satiety.   Home Medications    Current Outpatient Medications  Medication Sig Dispense Refill   acetaminophen (TYLENOL) 650 MG CR tablet Take 1,300 mg by mouth every 8 (eight) hours as needed for pain.     allopurinol (ZYLOPRIM) 300 MG tablet TAKE 1/2 TABLET BY MOUTH DAILY 45 tablet 0   Ascorbic Acid (VITAMIN C) 1000 MG tablet Take 1,000 mg by mouth daily.     aspirin 81 MG tablet Take 81 mg by mouth daily.     atorvastatin (LIPITOR) 80 MG tablet TAKE 1 TABLET BY MOUTH EACH NIGHT AT BEDTIME 90 tablet 3   benazepril (LOTENSIN) 20 MG tablet Take 1 tablet (20 mg total) by mouth daily. 90 tablet 3   blood glucose meter kit and supplies Dispense based on patient and insurance preference. Use to check fasting blood glucose in the morning and to check glucose 2 hours after largest meal of the day. (FOR ICD-10 E10.9, E11.9). 1 each 0   Cholecalciferol (VITAMIN D) 50 MCG (2000 UT) tablet Take 2,000 Units by mouth daily.     empagliflozin (JARDIANCE) 25 MG TABS tablet Take 1 tablet (25 mg total) by mouth daily before breakfast. 30 tablet 2   FLUoxetine (PROZAC) 40 MG capsule TAKE 1 CAPSULE BY MOUTH DAILY 90 capsule 0   gabapentin (NEURONTIN) 800 MG tablet TAKE 1 TABLET BY MOUTH 3 TIMES DAILY 90 tablet 0   glipiZIDE (GLUCOTROL) 5 MG tablet Take 1 tablet (5 mg total) by mouth 2 (two) times daily before a meal. 180 tablet 0   LANCETS ULTRA FINE MISC Use to check fasting blood glucose in the morning and to check glucose 2 hours after largest meal of the day 100 each 12   loratadine (CLARITIN) 10 MG tablet Take 10 mg by mouth daily.     Melatonin 10 MG CAPS Take 10 mg by mouth at bedtime.     metoprolol succinate (TOPROL-XL) 25 MG 24 hr tablet Take 0.5 tablets (12.5 mg total) by mouth daily. 90 tablet 1   Multiple Vitamins-Iron (MULTIVITAMINS WITH IRON) TABS Take 1 tablet by mouth daily. 30 tablet 0   pantoprazole (PROTONIX) 40 MG tablet TAKE 1  TABLET BY MOUTH DAILY 90 tablet 1   polyethylene glycol powder (GLYCOLAX/MIRALAX) powder USE AS DIRECTED (Patient taking differently: Take 17 g by mouth daily. USE AS DIRECTED) 850 g 11   sildenafil (VIAGRA) 100 MG tablet Take by mouth as needed for erectile dysfunction.     No current facility-administered medications for this visit.     Review of Systems  Please see the history of present illness.    All other systems reviewed and are otherwise negative except as noted above.  Physical Exam    Wt Readings from Last 3 Encounters:  09/03/22 229 lb 9.6 oz (104.1 kg)  06/23/22 229 lb 8 oz (104.1 kg)  05/19/22 228 lb 6.4 oz (103.6 kg)   VS: Vitals:   09/03/22 0841  BP: 138/68  Pulse: 72  SpO2: 95%  ,Body mass index is 32.02 kg/m.  Constitutional:      Appearance: Healthy appearance. Not in distress.  Neck:     Vascular: JVD normal.  Pulmonary:     Effort: Pulmonary effort is normal.     Breath sounds: No wheezing. No rales. Diminished in the bases Cardiovascular:     Normal rate. Regular rhythm. Normal S1. Normal S2.      Murmurs: There is no murmur.  Edema:    Peripheral edema absent.  Abdominal:     Palpations: Abdomen is soft non tender. There is no hepatomegaly.  Skin:    General: Skin is warm and dry.  Neurological:     General: No focal deficit present.     Mental Status: Alert and oriented to person, place and time.     Cranial Nerves: Cranial nerves are intact.  EKG/LABS/ Recent Cardiac Studies    ECG personally reviewed by me today -none completed today   Lab Results  Component Value Date   WBC 4.1 06/23/2022   HGB 14.1 06/23/2022   HCT 42.8 06/23/2022   MCV 83 06/23/2022   PLT 251 06/23/2022   Lab Results  Component Value Date   CREATININE 1.96 (H) 06/23/2022   BUN 28 (H) 06/23/2022   NA 140 06/23/2022   K 5.4 (H) 06/23/2022   CL 103 06/23/2022   CO2 23 06/23/2022   Lab Results  Component Value Date   ALT 24 06/23/2022   AST 28 06/23/2022    GGT 28 03/24/2017   ALKPHOS 130 (H) 06/23/2022   BILITOT 0.3 06/23/2022   Lab Results  Component Value Date   CHOL 172 06/23/2022   HDL 42 06/23/2022   LDLCALC 109 (H) 06/23/2022   TRIG 118 06/23/2022   CHOLHDL 4.1 06/23/2022    Lab Results  Component Value Date   HGBA1C 7.3 (H) 06/23/2022     Assessment & Plan    1.Essential hypertension: -Patient's blood pressure today was controlled at 138/68 -Continue Lotensin 20 mg daily -We will restart metoprolol succinate 12.5 mg daily -Patient was advised to monitor his blood pressures daily.  2.Coronary artery disease: -s/p CABG x 3 in 2020 with ( LIMA to LAD, SVG to DX1 and OM) -Today patient reports no chest pain or anginal equivalent. -Continue GDMT with Lipitor 80 mg daily, ASA 81 mg daily, metoprolol 12.5 mg daily -Continue secondary prevention with physical activity of at least 150 minutes/week  3.Pure hypercholesteremia: -Patient's last LDL cholesterol was elevated at 109 -Add ezetimibe 10 mg today and continue Lipitor grams daily -Will recheck lipids and LFTs in 8 weeks  4. DM type II: -Patient's last hemoglobin A1c was 7.3 -Continue current treatment plan per PCP and Jardiance 25 mg daily -Patient was advised to follow glycemic index for food choices.  5. CKD stage III B: -Patient's last creatinine was 1.6 -Currently followed by nephrology  Disposition: Follow-up with None or APP in 6 months   Medication Adjustments/Labs and Tests Ordered: Current medicines are reviewed at length with the patient today.  Concerns regarding  medicines are outlined above.   Signed, Napoleon Form, Leodis Rains, NP 09/03/2022, 9:23 AM [facility]

## 2022-09-03 ENCOUNTER — Encounter: Payer: Self-pay | Admitting: Nurse Practitioner

## 2022-09-03 ENCOUNTER — Ambulatory Visit: Payer: PPO | Attending: Nurse Practitioner | Admitting: Nurse Practitioner

## 2022-09-03 VITALS — BP 138/68 | HR 72 | Ht 71.0 in | Wt 229.6 lb

## 2022-09-03 DIAGNOSIS — Z7984 Long term (current) use of oral hypoglycemic drugs: Secondary | ICD-10-CM

## 2022-09-03 DIAGNOSIS — I25110 Atherosclerotic heart disease of native coronary artery with unstable angina pectoris: Secondary | ICD-10-CM

## 2022-09-03 DIAGNOSIS — N289 Disorder of kidney and ureter, unspecified: Secondary | ICD-10-CM | POA: Diagnosis not present

## 2022-09-03 DIAGNOSIS — E1169 Type 2 diabetes mellitus with other specified complication: Secondary | ICD-10-CM | POA: Diagnosis not present

## 2022-09-03 DIAGNOSIS — E1129 Type 2 diabetes mellitus with other diabetic kidney complication: Secondary | ICD-10-CM | POA: Diagnosis not present

## 2022-09-03 DIAGNOSIS — E785 Hyperlipidemia, unspecified: Secondary | ICD-10-CM | POA: Diagnosis not present

## 2022-09-03 DIAGNOSIS — I10 Essential (primary) hypertension: Secondary | ICD-10-CM | POA: Diagnosis not present

## 2022-09-03 MED ORDER — EZETIMIBE 10 MG PO TABS: 10.0000 mg | ORAL_TABLET | Freq: Every day | ORAL | 1 refills | Status: DC

## 2022-09-03 MED ORDER — METOPROLOL SUCCINATE ER 25 MG PO TB24: 12.5000 mg | ORAL_TABLET | Freq: Every day | ORAL | 1 refills | Status: DC

## 2022-09-03 NOTE — Patient Instructions (Addendum)
Medication Instructions:  RESTART Toprol XL 12.5mg  once a day  START Zetia 10mg  Take 1 tablet once a day  *If you need a refill on your cardiac medications before your next appointment, please call your pharmacy*   Lab Work: LIPIDS & LFTs IN 8 WEEKS  If you have labs (blood work) drawn today and your tests are completely normal, you will receive your results only by: MyChart Message (if you have MyChart) OR A paper copy in the mail If you have any lab test that is abnormal or we need to change your treatment, we will call you to review the results.   Testing/Procedures: NONE ORDERED    Follow-Up: At [facility], you and your health needs are our priority.  As part of our continuing mission to provide you with exceptional heart care, we have created designated Provider Care Teams.  These Care Teams include your primary Cardiologist (physician) and Advanced Practice Providers (APPs -  Physician Assistants and Nurse Practitioners) who all work together to provide you with the care you need, when you need it.  We recommend signing up for the patient portal called "MyChart".  Sign up information is provided on this After Visit Summary.  MyChart is used to connect with patients for Virtual Visits (Telemedicine).  Patients are able to view lab/test results, encounter notes, upcoming appointments, etc.  Non-urgent messages can be sent to your provider as well.   To learn more about what you can do with MyChart, go to [email or web address].    Your next appointment:   6 month(s)  Provider:   Robin Searing, NP   Other Instructions: Glycemic Index  Check your blood pressure daily for 2 weeks, then contact the office with your readings.  Make sure to check 2 hours after your medications.   AVOID these things for 30 minutes before checking your blood pressure: No Drinking caffeine. No Drinking alcohol. No Eating. No Smoking. No Exercising.  Five minutes before checking your  blood pressure: Pee. Sit in a dining chair. Avoid sitting in a soft couch or armchair. Be quiet. Do not talk.

## 2022-09-09 ENCOUNTER — Other Ambulatory Visit: Payer: Self-pay

## 2022-09-09 DIAGNOSIS — E1129 Type 2 diabetes mellitus with other diabetic kidney complication: Secondary | ICD-10-CM

## 2022-09-09 DIAGNOSIS — E1169 Type 2 diabetes mellitus with other specified complication: Secondary | ICD-10-CM

## 2022-09-09 DIAGNOSIS — E1159 Type 2 diabetes mellitus with other circulatory complications: Secondary | ICD-10-CM

## 2022-09-17 ENCOUNTER — Other Ambulatory Visit: Payer: PPO

## 2022-09-17 DIAGNOSIS — I152 Hypertension secondary to endocrine disorders: Secondary | ICD-10-CM | POA: Diagnosis not present

## 2022-09-17 DIAGNOSIS — E1129 Type 2 diabetes mellitus with other diabetic kidney complication: Secondary | ICD-10-CM | POA: Diagnosis not present

## 2022-09-17 DIAGNOSIS — E1159 Type 2 diabetes mellitus with other circulatory complications: Secondary | ICD-10-CM | POA: Diagnosis not present

## 2022-09-18 LAB — CBC WITH DIFFERENTIAL/PLATELET
Basophils Absolute: 0 10*3/uL (ref 0.0–0.2)
Basos: 0 %
EOS (ABSOLUTE): 0.1 10*3/uL (ref 0.0–0.4)
Eos: 2 %
Hematocrit: 46.4 % (ref 37.5–51.0)
Hemoglobin: 14.8 g/dL (ref 13.0–17.7)
Immature Grans (Abs): 0 10*3/uL (ref 0.0–0.1)
Immature Granulocytes: 0 %
Lymphocytes Absolute: 1.2 10*3/uL (ref 0.7–3.1)
Lymphs: 21 %
MCH: 26.8 pg (ref 26.6–33.0)
MCHC: 31.9 g/dL (ref 31.5–35.7)
MCV: 84 fL (ref 79–97)
Monocytes Absolute: 0.4 10*3/uL (ref 0.1–0.9)
Monocytes: 8 %
Neutrophils Absolute: 3.7 10*3/uL (ref 1.4–7.0)
Neutrophils: 69 %
Platelets: 253 10*3/uL (ref 150–450)
RBC: 5.52 x10E6/uL (ref 4.14–5.80)
RDW: 14.3 % (ref 11.6–15.4)
WBC: 5.4 10*3/uL (ref 3.4–10.8)

## 2022-09-18 LAB — COMPREHENSIVE METABOLIC PANEL WITH GFR
ALT: 28 [IU]/L (ref 0–44)
AST: 32 [IU]/L (ref 0–40)
Albumin: 4.6 g/dL (ref 3.9–4.9)
Alkaline Phosphatase: 118 [IU]/L (ref 44–121)
BUN/Creatinine Ratio: 13 (ref 10–24)
BUN: 25 mg/dL (ref 8–27)
Bilirubin Total: 0.6 mg/dL (ref 0.0–1.2)
CO2: 22 mmol/L (ref 20–29)
Calcium: 10 mg/dL (ref 8.6–10.2)
Chloride: 104 mmol/L (ref 96–106)
Creatinine, Ser: 1.96 mg/dL — ABNORMAL HIGH (ref 0.76–1.27)
Globulin, Total: 3.1 g/dL (ref 1.5–4.5)
Glucose: 134 mg/dL — ABNORMAL HIGH (ref 70–99)
Potassium: 5.3 mmol/L — ABNORMAL HIGH (ref 3.5–5.2)
Sodium: 141 mmol/L (ref 134–144)
Total Protein: 7.7 g/dL (ref 6.0–8.5)
eGFR: 37 mL/min/{1.73_m2} — ABNORMAL LOW

## 2022-09-18 LAB — HEMOGLOBIN A1C
Est. average glucose Bld gHb Est-mCnc: 186 mg/dL
Hgb A1c MFr Bld: 8.1 % — ABNORMAL HIGH (ref 4.8–5.6)

## 2022-09-24 ENCOUNTER — Ambulatory Visit (INDEPENDENT_AMBULATORY_CARE_PROVIDER_SITE_OTHER): Payer: PPO | Admitting: Family Medicine

## 2022-09-24 ENCOUNTER — Encounter: Payer: Self-pay | Admitting: Family Medicine

## 2022-09-24 VITALS — BP 123/77 | HR 60 | Resp 18 | Ht 71.0 in | Wt 231.0 lb

## 2022-09-24 DIAGNOSIS — Z7984 Long term (current) use of oral hypoglycemic drugs: Secondary | ICD-10-CM | POA: Diagnosis not present

## 2022-09-24 DIAGNOSIS — F39 Unspecified mood [affective] disorder: Secondary | ICD-10-CM

## 2022-09-24 DIAGNOSIS — E785 Hyperlipidemia, unspecified: Secondary | ICD-10-CM | POA: Diagnosis not present

## 2022-09-24 DIAGNOSIS — Z23 Encounter for immunization: Secondary | ICD-10-CM

## 2022-09-24 DIAGNOSIS — I152 Hypertension secondary to endocrine disorders: Secondary | ICD-10-CM | POA: Diagnosis not present

## 2022-09-24 DIAGNOSIS — N183 Chronic kidney disease, stage 3 unspecified: Secondary | ICD-10-CM | POA: Diagnosis not present

## 2022-09-24 DIAGNOSIS — E1129 Type 2 diabetes mellitus with other diabetic kidney complication: Secondary | ICD-10-CM

## 2022-09-24 DIAGNOSIS — E1159 Type 2 diabetes mellitus with other circulatory complications: Secondary | ICD-10-CM

## 2022-09-24 DIAGNOSIS — E1169 Type 2 diabetes mellitus with other specified complication: Secondary | ICD-10-CM | POA: Diagnosis not present

## 2022-09-24 LAB — POCT UA - MICROALBUMIN
Creatinine, POC: 50 mg/dL
Microalbumin Ur, POC: 80 mg/L

## 2022-09-24 MED ORDER — EMPAGLIFLOZIN 25 MG PO TABS
25.0000 mg | ORAL_TABLET | Freq: Every day | ORAL | 1 refills | Status: DC
Start: 2022-09-24 — End: 2022-12-24

## 2022-09-24 MED ORDER — GABAPENTIN 800 MG PO TABS: 800.0000 mg | ORAL_TABLET | Freq: Three times a day (TID) | ORAL | 0 refills | Status: DC

## 2022-09-24 MED ORDER — FLUOXETINE HCL 40 MG PO CAPS
40.0000 mg | ORAL_CAPSULE | Freq: Every day | ORAL | 0 refills | Status: DC
Start: 2022-09-24 — End: 2022-12-24

## 2022-09-24 MED ORDER — GLIPIZIDE 10 MG PO TABS
10.0000 mg | ORAL_TABLET | Freq: Two times a day (BID) | ORAL | 1 refills | Status: DC
Start: 2022-09-24 — End: 2022-12-24

## 2022-09-24 NOTE — Assessment & Plan Note (Signed)
BP goal <130/80.  Stable, at goal.  Continue benazepril 10 mg daily, metoprolol 12.5 mg daily.  Encouraged to continue ambulatory blood pressure monitoring.  Will continue to monitor.  Repeating CMP for medication management before next appointment.

## 2022-09-24 NOTE — Progress Notes (Signed)
Established Patient Office Visit  Subjective   Patient ID: [name] Banter Sr., male    DOB: 21-Jul-1955  Age: 67 y.o. MRN: [id number]  Chief Complaint  Patient presents with   Diabetes   Hyperlipidemia   Hypertension    HPI [name] Maria Sr. is a 67 y.o. male presenting today for follow up of hypertension, hyperlipidemia, diabetes. Hypertension: Patient here for follow-up of elevated blood pressure. He is exercising and is adherent to low salt diet.   Pt denies chest pain, SOB, dizziness, edema, syncope, fatigue or heart palpitations. Taking benazepril, metoprolol succinate, reports excellent compliance with treatment. Denies side effects.  His wife has been checking his blood pressure at home, denies any elevated blood pressures. Diabetes: denies hypoglycemic events, wounds or sores that are not healing well, increased thirst or urination. Denies vision problems, eye exam completed last year but will need to ask his wife where it was. Taking Jardiance 25 mg daily and glipizide 5 mg twice daily as prescribed without any side effects. He admits that he has been eating more sweets within the past few months.  Outpatient Medications Prior to Visit  Medication Sig   acetaminophen (TYLENOL) 650 MG CR tablet Take 1,300 mg by mouth every 8 (eight) hours as needed for pain.   allopurinol (ZYLOPRIM) 300 MG tablet TAKE 1/2 TABLET BY MOUTH DAILY   Ascorbic Acid (VITAMIN C) 1000 MG tablet Take 1,000 mg by mouth daily.   aspirin 81 MG tablet Take 81 mg by mouth daily.   atorvastatin (LIPITOR) 80 MG tablet TAKE 1 TABLET BY MOUTH EACH NIGHT AT BEDTIME   benazepril (LOTENSIN) 20 MG tablet Take 1 tablet (20 mg total) by mouth daily.   blood glucose meter kit and supplies Dispense based on patient and insurance preference. Use to check fasting blood glucose in the morning and to check glucose 2 hours after largest meal of the day. (FOR ICD-10 E10.9, E11.9).   Cholecalciferol (VITAMIN D) 50 MCG (2000 UT)  tablet Take 2,000 Units by mouth daily.   ezetimibe (ZETIA) 10 MG tablet Take 1 tablet (10 mg total) by mouth daily.   LANCETS ULTRA FINE MISC Use to check fasting blood glucose in the morning and to check glucose 2 hours after largest meal of the day   loratadine (CLARITIN) 10 MG tablet Take 10 mg by mouth daily.   Melatonin 10 MG CAPS Take 10 mg by mouth at bedtime.   metoprolol succinate (TOPROL-XL) 25 MG 24 hr tablet Take 0.5 tablets (12.5 mg total) by mouth daily.   Multiple Vitamins-Iron (MULTIVITAMINS WITH IRON) TABS Take 1 tablet by mouth daily.   pantoprazole (PROTONIX) 40 MG tablet TAKE 1 TABLET BY MOUTH DAILY   polyethylene glycol powder (GLYCOLAX/MIRALAX) powder USE AS DIRECTED (Patient taking differently: Take 17 g by mouth daily. USE AS DIRECTED)   sildenafil (VIAGRA) 100 MG tablet Take by mouth as needed for erectile dysfunction.   [DISCONTINUED] empagliflozin (JARDIANCE) 25 MG TABS tablet Take 1 tablet (25 mg total) by mouth daily before breakfast.   [DISCONTINUED] FLUoxetine (PROZAC) 40 MG capsule TAKE 1 CAPSULE BY MOUTH DAILY   [DISCONTINUED] gabapentin (NEURONTIN) 800 MG tablet TAKE 1 TABLET BY MOUTH 3 TIMES DAILY   [DISCONTINUED] glipiZIDE (GLUCOTROL) 5 MG tablet Take 1 tablet (5 mg total) by mouth 2 (two) times daily before a meal.   No facility-administered medications prior to visit.    ROS Negative unless otherwise noted in HPI   Objective:     BP 123/77 (  BP Location: Left Arm, Patient Position: Sitting, Cuff Size: Large)   Pulse 60   Resp 18   Ht 5\' 11"  (1.803 m)   Wt 231 lb (104.8 kg)   SpO2 98%   BMI 32.22 kg/m   Physical Exam Constitutional:      General: He is not in acute distress.    Appearance: Normal appearance.  HENT:     Head: Normocephalic and atraumatic.  Cardiovascular:     Rate and Rhythm: Normal rate and regular rhythm.     Heart sounds: Normal heart sounds. No murmur heard.    No friction rub. No gallop.  Pulmonary:     Effort:  Pulmonary effort is normal. No respiratory distress.     Breath sounds: Normal breath sounds. No wheezing, rhonchi or rales.  Skin:    General: Skin is warm and dry.  Neurological:     Mental Status: He is alert and oriented to person, place, and time.  Psychiatric:        Mood and Affect: Mood normal.    Results for orders placed or performed in visit on 09/24/22  POCT UA - Microalbumin  Result Value Ref Range   Microalbumin Ur, POC 80 mg/L   Creatinine, POC 50 mg/dL   Albumin/Creatinine Ratio, Urine, POC 30-300      Assessment & Plan:  Type 2 diabetes mellitus with other kidney complication, unspecified whether long term insulin use (HCC) Assessment & Plan: A1c increased to 8.1.  Likely due to increased consumption of sweets, recommend cutting back on sweets.  Also increasing glipizide to 10 mg twice daily before meals, continue Jardiance 25 mg daily.  Will continue to monitor.  Urine sample revealed increased albumin/creatinine ratio likely secondary to hyperglycemia.  Orders: -     POCT UA - Microalbumin -     Empagliflozin; Take 1 tablet (25 mg total) by mouth daily before breakfast.  Dispense: 90 tablet; Refill: 1 -     glipiZIDE; Take 1 tablet (10 mg total) by mouth 2 (two) times daily before a meal.  Dispense: 180 tablet; Refill: 1  Hypertension associated with diabetes (HCC) Assessment & Plan: BP goal <130/80.  Stable, at goal.  Continue benazepril 10 mg daily, metoprolol 12.5 mg daily.  Encouraged to continue ambulatory blood pressure monitoring.  Will continue to monitor.  Repeating CMP for medication management before next appointment.  Orders: -     Empagliflozin; Take 1 tablet (25 mg total) by mouth daily before breakfast.  Dispense: 90 tablet; Refill: 1  Need for influenza vaccination -     Flu Vaccine Trivalent High Dose (Fluad)  Stage 3 chronic kidney disease, unspecified whether stage 3a or 3b CKD (HCC) -     Empagliflozin; Take 1 tablet (25 mg total) by mouth  daily before breakfast.  Dispense: 90 tablet; Refill: 1  Mood disorder (HCC) -     FLUoxetine HCl; Take 1 capsule (40 mg total) by mouth daily.  Dispense: 90 capsule; Refill: 0  Other orders -     Gabapentin; Take 1 tablet (800 mg total) by mouth 3 (three) times daily.  Dispense: 90 tablet; Refill: 0    Return in about 3 months (around 12/24/2022) for follow-up for HTN, HLD, DM, fasting blood work 1 week before.    Melida Quitter, PA

## 2022-09-24 NOTE — Patient Instructions (Signed)
If you can find where your last eye exam was so that we can get a copy, that would be great!  If it is time to update your annual eye exam, I also recommend doing so.  It is also recommended that you update the following vaccines at your local pharmacy to protect yourself: -Pneumonia PCV20: one and done! -Shingles: 2 doses and done

## 2022-09-24 NOTE — Assessment & Plan Note (Signed)
A1c increased to 8.1.  Likely due to increased consumption of sweets, recommend cutting back on sweets.  Also increasing glipizide to 10 mg twice daily before meals, continue Jardiance 25 mg daily.  Will continue to monitor.  Urine sample revealed increased albumin/creatinine ratio likely secondary to hyperglycemia.

## 2022-10-29 ENCOUNTER — Other Ambulatory Visit: Payer: Self-pay | Admitting: Family Medicine

## 2022-10-29 ENCOUNTER — Ambulatory Visit: Payer: PPO | Attending: Nurse Practitioner

## 2022-11-17 DIAGNOSIS — I129 Hypertensive chronic kidney disease with stage 1 through stage 4 chronic kidney disease, or unspecified chronic kidney disease: Secondary | ICD-10-CM | POA: Diagnosis not present

## 2022-11-17 DIAGNOSIS — M109 Gout, unspecified: Secondary | ICD-10-CM | POA: Diagnosis not present

## 2022-11-17 DIAGNOSIS — N183 Chronic kidney disease, stage 3 unspecified: Secondary | ICD-10-CM | POA: Diagnosis not present

## 2022-11-17 DIAGNOSIS — E1122 Type 2 diabetes mellitus with diabetic chronic kidney disease: Secondary | ICD-10-CM | POA: Diagnosis not present

## 2022-12-16 ENCOUNTER — Other Ambulatory Visit: Payer: PPO

## 2022-12-17 ENCOUNTER — Other Ambulatory Visit: Payer: PPO

## 2022-12-17 DIAGNOSIS — N289 Disorder of kidney and ureter, unspecified: Secondary | ICD-10-CM | POA: Diagnosis not present

## 2022-12-17 DIAGNOSIS — E119 Type 2 diabetes mellitus without complications: Secondary | ICD-10-CM | POA: Diagnosis not present

## 2022-12-17 DIAGNOSIS — E785 Hyperlipidemia, unspecified: Secondary | ICD-10-CM | POA: Diagnosis not present

## 2022-12-17 DIAGNOSIS — I25110 Atherosclerotic heart disease of native coronary artery with unstable angina pectoris: Secondary | ICD-10-CM

## 2022-12-17 DIAGNOSIS — E1159 Type 2 diabetes mellitus with other circulatory complications: Secondary | ICD-10-CM

## 2022-12-17 DIAGNOSIS — I10 Essential (primary) hypertension: Secondary | ICD-10-CM | POA: Diagnosis not present

## 2022-12-17 DIAGNOSIS — E1169 Type 2 diabetes mellitus with other specified complication: Secondary | ICD-10-CM | POA: Diagnosis not present

## 2022-12-17 DIAGNOSIS — E1129 Type 2 diabetes mellitus with other diabetic kidney complication: Secondary | ICD-10-CM

## 2022-12-17 DIAGNOSIS — I152 Hypertension secondary to endocrine disorders: Secondary | ICD-10-CM | POA: Diagnosis not present

## 2022-12-18 ENCOUNTER — Other Ambulatory Visit: Payer: Self-pay | Admitting: Family Medicine

## 2022-12-18 LAB — CBC WITH DIFFERENTIAL/PLATELET
Basophils Absolute: 0 10*3/uL (ref 0.0–0.2)
Basos: 0 %
EOS (ABSOLUTE): 0.1 10*3/uL (ref 0.0–0.4)
Eos: 2 %
Hematocrit: 40.9 % (ref 37.5–51.0)
Hemoglobin: 13.4 g/dL (ref 13.0–17.7)
Immature Grans (Abs): 0 10*3/uL (ref 0.0–0.1)
Immature Granulocytes: 0 %
Lymphocytes Absolute: 1 10*3/uL (ref 0.7–3.1)
Lymphs: 20 %
MCH: 27.3 pg (ref 26.6–33.0)
MCHC: 32.8 g/dL (ref 31.5–35.7)
MCV: 84 fL (ref 79–97)
Monocytes Absolute: 0.4 10*3/uL (ref 0.1–0.9)
Monocytes: 9 %
Neutrophils Absolute: 3.6 10*3/uL (ref 1.4–7.0)
Neutrophils: 69 %
Platelets: 230 10*3/uL (ref 150–450)
RBC: 4.9 x10E6/uL (ref 4.14–5.80)
RDW: 14.5 % (ref 11.6–15.4)
WBC: 5.1 10*3/uL (ref 3.4–10.8)

## 2022-12-18 LAB — COMPREHENSIVE METABOLIC PANEL
ALT: 23 [IU]/L (ref 0–44)
AST: 30 [IU]/L (ref 0–40)
Albumin: 4.7 g/dL (ref 3.9–4.9)
Alkaline Phosphatase: 113 [IU]/L (ref 44–121)
BUN/Creatinine Ratio: 16 (ref 10–24)
BUN: 28 mg/dL — ABNORMAL HIGH (ref 8–27)
Bilirubin Total: 0.6 mg/dL (ref 0.0–1.2)
CO2: 21 mmol/L (ref 20–29)
Calcium: 10 mg/dL (ref 8.6–10.2)
Chloride: 103 mmol/L (ref 96–106)
Creatinine, Ser: 1.73 mg/dL — ABNORMAL HIGH (ref 0.76–1.27)
Globulin, Total: 2.9 g/dL (ref 1.5–4.5)
Glucose: 129 mg/dL — ABNORMAL HIGH (ref 70–99)
Potassium: 4.8 mmol/L (ref 3.5–5.2)
Sodium: 140 mmol/L (ref 134–144)
Total Protein: 7.6 g/dL (ref 6.0–8.5)
eGFR: 42 mL/min/{1.73_m2} — ABNORMAL LOW (ref >59)

## 2022-12-18 LAB — HEMOGLOBIN A1C
Est. average glucose Bld gHb Est-mCnc: 163 mg/dL
Hgb A1c MFr Bld: 7.3 % — ABNORMAL HIGH (ref 4.8–5.6)

## 2022-12-18 LAB — LIPID PANEL
Chol/HDL Ratio: 2.4 {ratio} (ref 0.0–5.0)
Cholesterol, Total: 110 mg/dL (ref 100–199)
HDL: 45 mg/dL (ref >39)
LDL Chol Calc (NIH): 48 mg/dL (ref 0–99)
Triglycerides: 90 mg/dL (ref 0–149)
VLDL Cholesterol Cal: 17 mg/dL (ref 5–40)

## 2022-12-18 LAB — HEPATIC FUNCTION PANEL: Bilirubin, Direct: 0.22 mg/dL (ref 0.00–0.40)

## 2022-12-24 ENCOUNTER — Ambulatory Visit (INDEPENDENT_AMBULATORY_CARE_PROVIDER_SITE_OTHER): Payer: PPO | Admitting: Family Medicine

## 2022-12-24 ENCOUNTER — Encounter: Payer: Self-pay | Admitting: Family Medicine

## 2022-12-24 VITALS — BP 125/73 | HR 52 | Resp 18 | Ht 71.0 in | Wt 231.0 lb

## 2022-12-24 DIAGNOSIS — F39 Unspecified mood [affective] disorder: Secondary | ICD-10-CM | POA: Diagnosis not present

## 2022-12-24 DIAGNOSIS — N183 Chronic kidney disease, stage 3 unspecified: Secondary | ICD-10-CM | POA: Diagnosis not present

## 2022-12-24 DIAGNOSIS — M109 Gout, unspecified: Secondary | ICD-10-CM | POA: Diagnosis not present

## 2022-12-24 DIAGNOSIS — E1122 Type 2 diabetes mellitus with diabetic chronic kidney disease: Secondary | ICD-10-CM

## 2022-12-24 DIAGNOSIS — E1129 Type 2 diabetes mellitus with other diabetic kidney complication: Secondary | ICD-10-CM

## 2022-12-24 DIAGNOSIS — N184 Chronic kidney disease, stage 4 (severe): Secondary | ICD-10-CM

## 2022-12-24 DIAGNOSIS — E1159 Type 2 diabetes mellitus with other circulatory complications: Secondary | ICD-10-CM

## 2022-12-24 DIAGNOSIS — E1169 Type 2 diabetes mellitus with other specified complication: Secondary | ICD-10-CM | POA: Diagnosis not present

## 2022-12-24 DIAGNOSIS — E785 Hyperlipidemia, unspecified: Secondary | ICD-10-CM | POA: Diagnosis not present

## 2022-12-24 DIAGNOSIS — Z7984 Long term (current) use of oral hypoglycemic drugs: Secondary | ICD-10-CM | POA: Diagnosis not present

## 2022-12-24 DIAGNOSIS — E1121 Type 2 diabetes mellitus with diabetic nephropathy: Secondary | ICD-10-CM

## 2022-12-24 DIAGNOSIS — I152 Hypertension secondary to endocrine disorders: Secondary | ICD-10-CM

## 2022-12-24 MED ORDER — EMPAGLIFLOZIN 25 MG PO TABS
25.0000 mg | ORAL_TABLET | Freq: Every day | ORAL | 1 refills | Status: DC
Start: 2022-12-24 — End: 2023-07-13

## 2022-12-24 MED ORDER — FLUOXETINE HCL 40 MG PO CAPS: 40.0000 mg | ORAL_CAPSULE | Freq: Every day | ORAL | 1 refills | Status: DC

## 2022-12-24 MED ORDER — ALLOPURINOL 300 MG PO TABS
150.0000 mg | ORAL_TABLET | Freq: Every day | ORAL | 1 refills | Status: DC
Start: 2022-12-24 — End: 2023-11-01

## 2022-12-24 MED ORDER — GLIPIZIDE 10 MG PO TABS
10.0000 mg | ORAL_TABLET | Freq: Two times a day (BID) | ORAL | 1 refills | Status: DC
Start: 2022-12-24 — End: 2023-09-28

## 2022-12-24 NOTE — Assessment & Plan Note (Signed)
BP goal <130/80.  Stable, at goal.  Continue benazepril 10 mg daily, metoprolol 12.5 mg daily.  Encouraged to continue ambulatory blood pressure monitoring.  Will continue to monitor.  CMP within normal limits with the exception of renal labs.

## 2022-12-24 NOTE — Progress Notes (Signed)
Established Patient Office Visit  Subjective   Patient ID: [name] Friar Sr., male    DOB: 1955/09/04  Age: 67 y.o. MRN: [id number]  Chief Complaint  Patient presents with   Diabetes   Hyperlipidemia   Hypertension    HPI [name] Fagin Sr. is a 67 y.o. male presenting today for follow up of hypertension, hyperlipidemia, diabetes. Hypertension: Patient here for follow-up of elevated blood pressure. Pt denies chest pain, SOB, dizziness, edema, syncope, fatigue or heart palpitations. Taking benazepril and metoprolol, reports excellent compliance with treatment. Denies side effects. Hyperlipidemia: tolerating atorvastatin 80 mg daily well with no myalgias or significant side effects.  The ASCVD Risk score (Arnett DK, et al., 2019) failed to calculate for the following reasons:   The valid total cholesterol range is 130 to 320 mg/dL Diabetes: denies hypoglycemic events, wounds or sores that are not healing well, increased thirst or urination. Denies vision problems, eye exam due. Taking Jardiance and glipizide as prescribed without any side effects.   Outpatient Medications Prior to Visit  Medication Sig   acetaminophen (TYLENOL) 650 MG CR tablet Take 1,300 mg by mouth every 8 (eight) hours as needed for pain.   Ascorbic Acid (VITAMIN C) 1000 MG tablet Take 1,000 mg by mouth daily.   aspirin 81 MG tablet Take 81 mg by mouth daily.   atorvastatin (LIPITOR) 80 MG tablet TAKE 1 TABLET BY MOUTH EACH NIGHT AT BEDTIME   benazepril (LOTENSIN) 20 MG tablet Take 1 tablet (20 mg total) by mouth daily.   blood glucose meter kit and supplies Dispense based on patient and insurance preference. Use to check fasting blood glucose in the morning and to check glucose 2 hours after largest meal of the day. (FOR ICD-10 E10.9, E11.9).   Cholecalciferol (VITAMIN D) 50 MCG (2000 UT) tablet Take 2,000 Units by mouth daily.   gabapentin (NEURONTIN) 800 MG tablet TAKE 1 TABLET BY MOUTH 3 TIMES DAILY   LANCETS  ULTRA FINE MISC Use to check fasting blood glucose in the morning and to check glucose 2 hours after largest meal of the day   loratadine (CLARITIN) 10 MG tablet Take 10 mg by mouth daily.   Melatonin 10 MG CAPS Take 10 mg by mouth at bedtime.   metoprolol succinate (TOPROL-XL) 25 MG 24 hr tablet Take 0.5 tablets (12.5 mg total) by mouth daily.   Multiple Vitamins-Iron (MULTIVITAMINS WITH IRON) TABS Take 1 tablet by mouth daily.   pantoprazole (PROTONIX) 40 MG tablet TAKE 1 TABLET BY MOUTH DAILY   polyethylene glycol powder (GLYCOLAX/MIRALAX) powder USE AS DIRECTED (Patient taking differently: Take 17 g by mouth daily. USE AS DIRECTED)   sildenafil (VIAGRA) 100 MG tablet Take by mouth as needed for erectile dysfunction.   [DISCONTINUED] allopurinol (ZYLOPRIM) 300 MG tablet TAKE 1/2 TABLET BY MOUTH DAILY   [DISCONTINUED] empagliflozin (JARDIANCE) 25 MG TABS tablet Take 1 tablet (25 mg total) by mouth daily before breakfast.   [DISCONTINUED] FLUoxetine (PROZAC) 40 MG capsule Take 1 capsule (40 mg total) by mouth daily.   [DISCONTINUED] glipiZIDE (GLUCOTROL) 10 MG tablet Take 1 tablet (10 mg total) by mouth 2 (two) times daily before a meal.   ezetimibe (ZETIA) 10 MG tablet Take 1 tablet (10 mg total) by mouth daily.   No facility-administered medications prior to visit.    ROS Negative unless otherwise noted in HPI   Objective:     BP 125/73 (BP Location: Left Arm, Patient Position: Sitting, Cuff Size: Large)   Pulse Marland Kitchen)  52   Resp 18   Ht 5\' 11"  (1.803 m)   Wt 231 lb (104.8 kg)   SpO2 97%   BMI 32.22 kg/m   Physical Exam Constitutional:      General: He is not in acute distress.    Appearance: Normal appearance.  HENT:     Head: Normocephalic and atraumatic.  Cardiovascular:     Rate and Rhythm: Normal rate and regular rhythm.     Heart sounds: Normal heart sounds. No murmur heard.    No friction rub. No gallop.  Pulmonary:     Effort: Pulmonary effort is normal. No  respiratory distress.     Breath sounds: Normal breath sounds. No wheezing, rhonchi or rales.  Skin:    General: Skin is warm and dry.  Neurological:     Mental Status: He is alert and oriented to person, place, and time.  Psychiatric:        Mood and Affect: Mood normal.     Assessment & Plan:  Hypertension associated with diabetes (HCC) Assessment & Plan: BP goal <130/80.  Stable, at goal.  Continue benazepril 10 mg daily, metoprolol 12.5 mg daily.  Encouraged to continue ambulatory blood pressure monitoring.  Will continue to monitor.  CMP within normal limits with the exception of renal labs.  Orders: -     Empagliflozin; Take 1 tablet (25 mg total) by mouth daily before breakfast.  Dispense: 90 tablet; Refill: 1  Hyperlipidemia associated with type 2 diabetes mellitus (HCC) Assessment & Plan: Last lipid panel: LDL 48, HDL 45, triglycerides 90.  At goal.  Hepatic function within normal limits.  Continue atorvastatin 80 mg daily.  Will continue to monitor.   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  Continue Jardiance 25 mg daily, glipizide 10 mg twice daily before meals.  Recommend to complete eye exam.  Will continue to monitor.  Orders: -     Empagliflozin; Take 1 tablet (25 mg total) by mouth daily before breakfast.  Dispense: 90 tablet; Refill: 1 -     glipiZIDE; Take 1 tablet (10 mg total) by mouth 2 (two) times daily before a meal.  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  Creatinine and GFR improved slightly to 1.73 and 42, BUN slightly elevated at 28. Continue good glycemic and blood pressure control.   Gout, unspecified cause, unspecified chronicity, unspecified site -     Allopurinol; Take 0.5 tablets (150 mg total) by mouth  daily.  Dispense: 45 tablet; Refill: 1  Stage 3 chronic kidney disease, unspecified whether stage 3a or 3b CKD (HCC) -     Empagliflozin; Take 1 tablet (25 mg total) by mouth daily before breakfast.  Dispense: 90 tablet; Refill: 1  Mood disorder (HCC) -     FLUoxetine HCl; Take 1 capsule (40 mg total) by mouth daily.  Dispense: 90 capsule; Refill: 1    Return in about 3 months (around 03/24/2023) for follow-up for HTN, DM, POC A1C at visit.    Melida Quitter, PA

## 2022-12-24 NOTE — Assessment & Plan Note (Signed)
A1c improved to 7.3 from 8.1.  Continue Jardiance 25 mg daily, glipizide 10 mg twice daily before meals.  Recommend to complete eye exam.  Will continue to monitor.

## 2022-12-24 NOTE — Assessment & Plan Note (Signed)
Last lipid panel: LDL 48, HDL 45, triglycerides 90.  At goal.  Hepatic function within normal limits.  Continue atorvastatin 80 mg daily.  Will continue to monitor.

## 2022-12-24 NOTE — Assessment & Plan Note (Signed)
Followed by nephrology.  Creatinine and GFR improved slightly to 1.73 and 42, BUN slightly elevated at 28. Continue good glycemic and blood pressure control.

## 2022-12-24 NOTE — Patient Instructions (Addendum)
Keep up the FANTASTIC work!  I was very excited to see that your A1c is 7.3.  In the next several months, if you get your shingles shots and your eye exam, we will request copies so that our records are up-to-date as well.  Enjoy your holidays!

## 2023-01-01 ENCOUNTER — Other Ambulatory Visit: Payer: Self-pay | Admitting: Family Medicine

## 2023-01-01 ENCOUNTER — Other Ambulatory Visit: Payer: Self-pay

## 2023-01-01 DIAGNOSIS — R12 Heartburn: Secondary | ICD-10-CM

## 2023-01-01 MED ORDER — ATORVASTATIN CALCIUM 80 MG PO TABS: 80.0000 mg | ORAL_TABLET | Freq: Every day | ORAL | 2 refills | Status: DC

## 2023-01-25 ENCOUNTER — Other Ambulatory Visit: Payer: Self-pay | Admitting: Family Medicine

## 2023-01-25 DIAGNOSIS — E1129 Type 2 diabetes mellitus with other diabetic kidney complication: Secondary | ICD-10-CM

## 2023-01-25 NOTE — Telephone Encounter (Signed)
Dose was changed 09/24/22

## 2023-02-05 ENCOUNTER — Other Ambulatory Visit: Payer: Self-pay | Admitting: Family Medicine

## 2023-02-11 ENCOUNTER — Encounter: Payer: Self-pay | Admitting: Family Medicine

## 2023-02-25 ENCOUNTER — Encounter: Payer: Self-pay | Admitting: Nurse Practitioner

## 2023-02-25 ENCOUNTER — Telehealth: Payer: Self-pay

## 2023-02-25 ENCOUNTER — Telehealth: Payer: PPO | Admitting: Nurse Practitioner

## 2023-02-25 DIAGNOSIS — J069 Acute upper respiratory infection, unspecified: Secondary | ICD-10-CM | POA: Diagnosis not present

## 2023-02-25 NOTE — Telephone Encounter (Signed)
Reached out to patient to review chart before video visit. Left patient a detailed voice message to return call to office.

## 2023-02-25 NOTE — Progress Notes (Signed)
[facility] [address] [place] [address] Dept: [phone] Dept Fax: [phone]  Virtual Video Visit  I connected with [name] on 02/25/23 at 10:20 AM EST by a video enabled telemedicine application and verified that I am speaking with the correct person using two identifiers.  Location patient: Home Location provider: Clinic Persons participating in the virtual visit: Patient; [name] Pickle, NP; Derenda Fennel, CMA  I discussed the limitations of evaluation and management by telemedicine and the availability of in person appointments. The patient expressed understanding and agreed to proceed.  Chief Complaint  Patient presents with   Cough    Cough and headache, fever 99.9, body aches, chills x 2 days.      SUBJECTIVE:  HPI: [name] Bogert Sr. is a 68 y.o. male who presents with cough, headache, and fever that started yesterday.   UPPER RESPIRATORY TRACT INFECTION  Fever: yes Cough: yes Shortness of breath: no Wheezing: no Chest pain: no Chest tightness: no Chest congestion: no Nasal congestion: yes Runny nose: yes Post nasal drip: no Sneezing: no Sore throat: no Swollen glands: no Sinus pressure: no Headache: yes Face pain: no Toothache: no Ear pain: no bilateral Ear pressure: no bilateral Eyes red/itching:no Eye drainage/crusting: no  Vomiting: no Rash: no Fatigue: yes Sick contacts: yes - grandchild sick - not tested Strep contacts: no  Context: stable Recurrent sinusitis: no Relief with OTC cold/cough medications: yes  Treatments attempted: nyquil    Patient Active Problem List   Diagnosis Date Noted   Umbilical hernia without obstruction and without gangrene    Hammer toe of second toe of left foot 08/01/2019   Hammer toe of second toe of right foot 08/01/2019   Hav (hallux abducto valgus), unspecified laterality 08/01/2019   Corn or callus 04/03/2019   Gastroesophageal  reflux disease 04/03/2019   Insomnia 12/05/2018   Coronary artery disease involving native coronary artery of native heart with unstable angina pectoris (HCC) 10/26/2018   S/P CABG x 3 10/26/2018   Acquired trigger finger of left middle finger 10/06/2018   Chronic kidney disease (CKD), stage III (moderate) 02/08/2018   Type 2 diabetes mellitus with renal complication (HCC) 02/08/2018   Chronic left hip pain 07/26/2017   Facet arthropathy, lumbosacral 07/26/2017   DDD (degenerative disc disease), lumbosacral 07/26/2017   Attention deficit hyperactivity disorder (ADHD), predominantly inattentive type 07/26/2017   Morbid obesity (HCC) 04/25/2017   Prostatitis 04/20/2014   Tinea versicolor 12/06/2013   Lumbar spondylosis 05/26/2013   Mood disorder (HCC) 03/29/2013   Decreased libido 03/29/2013   Hypertension associated with diabetes (HCC) 12/01/2011   Renal insufficiency 12/01/2011   Vitamin D deficiency 11/30/2007   GOUT 11/30/2007   BPH (benign prostatic hyperplasia) 11/30/2007   Chronic back pain greater than 3 months duration 06/11/2006   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) 06/11/2006   Hyperlipidemia associated with type 2 diabetes mellitus (HCC) 05/26/2006   OBSTRUCTIVE SLEEP APNEA 05/26/2006    Past Surgical History:  Procedure Laterality Date   CARDIAC CATHETERIZATION     CARPAL TUNNEL RELEASE  09/10/2015   RIGHT WRIST / WITH NERVE IMPINGEMENT SURGERY   CERVICAL FUSION     Dr Newell Coral   COLONOSCOPY  2007   negative; [facility]   CORONARY ANGIOGRAPHY N/A 10/25/2018   Procedure: CORONARY ANGIOGRAPHY (CATH LAB);  Surgeon: Tonny Bollman, MD;  Location: [facility] INVASIVE CV LAB;  Service: Cardiovascular;  Laterality: N/A;  CORONARY ARTERY BYPASS GRAFT N/A 10/26/2018   Procedure: CORONARY ARTERY BYPASS GRAFTING (CABG) x3 using the LIMA to LAD and right greater saphenous vein via endoscopic harvest to the Diag 1 and OM 2.;   Surgeon: Corliss Skains, MD;  Location: [facility] OR;  Service: Open Heart Surgery;  Laterality: N/A;   ESOPHAGEAL DILATION  2007   FINGER AMPUTATION  2003   Left Index finger amputation & reattachment   LUMBAR FUSION      Dr Newell Coral   UMBILICAL HERNIA REPAIR N/A 04/03/2020   Procedure: HERNIA REPAIR UMBILICAL ADULT, open;  Surgeon: Henrene Dodge, MD;  Location: [facility] ORS;  Service: General;  Laterality: N/A;    Family History  Problem Relation Age of Onset   Aneurysm Mother 59       CNS aneurysm   Sudden death Mother    Heart disease Father        pacer   Urolithiasis Father    Heart attack Father    Diabetes Sister    Heart disease Sister    Heart disease Sister    Kidney disease Brother    Cancer Maternal Uncle        ? primary   Cancer Paternal Uncle        ? primary   Hyperlipidemia Neg Hx    Hypertension Neg Hx    Colon cancer Neg Hx    Colon polyps Neg Hx    Esophageal cancer Neg Hx    Rectal cancer Neg Hx    Stomach cancer Neg Hx     Social History   Tobacco Use   Smoking status: Former    Current packs/day: 0.00    Average packs/day: 1.5 packs/day for 4.0 years (6.0 ttl pk-yrs)    Types: Cigarettes    Start date: 01/06/1972    Quit date: 01/06/1976    Years since quitting: 47.1   Smokeless tobacco: Never   Tobacco comments:    smoked 35 years ago as of 2013   Vaping Use   Vaping status: Never Used  Substance Use Topics   Alcohol use: No   Drug use: No     Current Outpatient Medications:    acetaminophen (TYLENOL) 650 MG CR tablet, Take 1,300 mg by mouth every 8 (eight) hours as needed for pain., Disp: , Rfl:    allopurinol (ZYLOPRIM) 300 MG tablet, Take 0.5 tablets (150 mg total) by mouth daily., Disp: 45 tablet, Rfl: 1   Ascorbic Acid (VITAMIN C) 1000 MG tablet, Take 1,000 mg by mouth daily., Disp: , Rfl:    aspirin 81 MG tablet, Take 81 mg by mouth daily., Disp: , Rfl:    atorvastatin (LIPITOR) 80 MG tablet, Take 1 tablet (80 mg total) by mouth at  bedtime., Disp: 90 tablet, Rfl: 2   benazepril (LOTENSIN) 20 MG tablet, Take 1 tablet (20 mg total) by mouth daily., Disp: 90 tablet, Rfl: 3   blood glucose meter kit and supplies, Dispense based on patient and insurance preference. Use to check fasting blood glucose in the morning and to check glucose 2 hours after largest meal of the day. (FOR ICD-10 E10.9, E11.9)., Disp: 1 each, Rfl: 0   Cholecalciferol (VITAMIN D) 50 MCG (2000 UT) tablet, Take 2,000 Units by mouth daily., Disp: , Rfl:    FLUoxetine (PROZAC) 40 MG capsule, Take 1 capsule (40 mg total) by mouth daily., Disp: 90 capsule, Rfl: 1   gabapentin (NEURONTIN) 800 MG tablet, TAKE 1 TABLET BY MOUTH 3 TIMES  DAILY, Disp: 90 tablet, Rfl: 3   glipiZIDE (GLUCOTROL) 10 MG tablet, Take 1 tablet (10 mg total) by mouth 2 (two) times daily before a meal., Disp: 180 tablet, Rfl: 1   LANCETS ULTRA FINE MISC, Use to check fasting blood glucose in the morning and to check glucose 2 hours after largest meal of the day, Disp: 100 each, Rfl: 12   loratadine (CLARITIN) 10 MG tablet, Take 10 mg by mouth daily., Disp: , Rfl:    Melatonin 10 MG CAPS, Take 10 mg by mouth at bedtime., Disp: , Rfl:    metoprolol succinate (TOPROL-XL) 25 MG 24 hr tablet, Take 0.5 tablets (12.5 mg total) by mouth daily., Disp: 45 tablet, Rfl: 1   Multiple Vitamins-Iron (MULTIVITAMINS WITH IRON) TABS, Take 1 tablet by mouth daily., Disp: 30 tablet, Rfl: 0   pantoprazole (PROTONIX) 40 MG tablet, TAKE 1 TABLET BY MOUTH DAILY, Disp: 90 tablet, Rfl: 1   polyethylene glycol powder (GLYCOLAX/MIRALAX) powder, USE AS DIRECTED (Patient taking differently: Take 17 g by mouth daily. USE AS DIRECTED), Disp: 850 g, Rfl: 11   sildenafil (VIAGRA) 100 MG tablet, Take by mouth as needed for erectile dysfunction., Disp: , Rfl:    empagliflozin (JARDIANCE) 25 MG TABS tablet, Take 1 tablet (25 mg total) by mouth daily before breakfast. (Patient not taking: Reported on 02/25/2023), Disp: 90 tablet, Rfl: 1    ezetimibe (ZETIA) 10 MG tablet, Take 1 tablet (10 mg total) by mouth daily., Disp: 90 tablet, Rfl: 1  Allergies  Allergen Reactions   Prednisone Other (See Comments)    Caused blood sugar to increase, pt will not take    ROS: See pertinent positives and negatives per HPI.  OBSERVATIONS/OBJECTIVE:  VITALS per patient if applicable: There were no vitals filed for this visit. There is no height or weight on file to calculate BMI.    GENERAL: Alert and oriented. Appears well and in no acute distress.  HEENT: Atraumatic. Conjunctiva clear. No obvious abnormalities on inspection of external nose and ears.  NECK: Normal movements of the head and neck.  LUNGS: On inspection, no signs of respiratory distress. Breathing rate appears normal. No obvious gross SOB, gasping or wheezing, and no conversational dyspnea.  CV: No obvious cyanosis.  MS: Moves all visible extremities without noticeable abnormality.  PSYCH/NEURO: Pleasant and cooperative. No obvious depression or anxiety. Speech and thought processing grossly intact.  ASSESSMENT AND PLAN:  Problem List Items Addressed This Visit   None Visit Diagnoses       Upper respiratory tract infection, unspecified type    -  Primary   Declines flu/covid test. Most likely viral. Encourage fluids, rest. Can continue dayquil as needed. Declines Rx cough medication.        I discussed the assessment and treatment plan with the patient. The patient was provided an opportunity to ask questions and all were answered. The patient agreed with the plan and demonstrated an understanding of the instructions.   The patient was advised to call back or seek an in-person evaluation if the symptoms worsen or if the condition fails to improve as anticipated.   Gerre Scull, NP

## 2023-02-25 NOTE — Patient Instructions (Signed)
It was great to see you!  Your symptoms and exam findings are most consistent with a viral upper respiratory infection. These usually run their course in 5-7 days. Unfortunately, antibiotics don't work against viruses and just increase your risk of other issues such as diarrhea, yeast infections, and resistant infections.  If you start feeling worse with facial pain, high fever, cough, shortness of breath or start feeling significantly worse, please call us right away to be further evaluated.  Some things that can make you feel better are: - Increased rest - Increasing Fluids - Acetaminophen / ibuprofen as needed for fever/pain.  - Salt water gargling, chloraseptic spray and throat lozenges - OTC pseudoephedrine or coricidin if you have a history of high blood pressure or take blood pressure medications - Mucinex.  - Saline sinus flushes or a neti pot.  - Humidifying the air.  Let's follow-up if your symptoms don't improve or worsen  Take care,  [name], NP

## 2023-02-26 ENCOUNTER — Other Ambulatory Visit: Payer: Self-pay | Admitting: Nurse Practitioner

## 2023-02-27 LAB — LAB REPORT - SCANNED: EGFR: 46

## 2023-04-01 ENCOUNTER — Ambulatory Visit: Payer: PPO | Admitting: Family Medicine

## 2023-06-03 ENCOUNTER — Ambulatory Visit: Payer: PPO

## 2023-06-03 DIAGNOSIS — Z0000 Encounter for general adult medical examination without abnormal findings: Secondary | ICD-10-CM

## 2023-06-03 NOTE — Progress Notes (Signed)
 Subjective:   [name] Yellin Sr. is a 68 y.o. who presents for a Medicare Wellness preventive visit.  As a reminder, Annual Wellness Visits don't include a physical exam, and some assessments may be limited, especially if this visit is performed virtually. We may recommend an in-person follow-up visit with your provider if needed.  Visit Complete: Virtual I connected with  [name] Grieves Sr. on 06/03/23 by a audio enabled telemedicine application and verified that I am speaking with the correct person using two identifiers.  Patient Location: Home  Provider Location: Home Office  I discussed the limitations of evaluation and management by telemedicine. The patient expressed understanding and agreed to proceed.  Vital Signs: Because this visit was a virtual/telehealth visit, some criteria may be missing or patient reported. Any vitals not documented were not able to be obtained and vitals that have been documented are patient reported.  VideoError- Librarian, academic were attempted between this provider and patient, however failed, due to patient having technical difficulties OR patient did not have access to video capability.  We continued and completed visit with audio only.   Persons Participating in Visit: Patient.  AWV Questionnaire: No: Patient Medicare AWV questionnaire was not completed prior to this visit.  Cardiac Risk Factors include: advanced age (>69men, >5 women);diabetes mellitus;dyslipidemia;hypertension;male gender     Objective:     Today's Vitals   There is no height or weight on file to calculate BMI.     06/03/2023    4:21 PM 05/14/2022    2:51 PM 07/04/2021    8:15 AM 03/29/2020   10:58 AM 10/25/2018    9:04 AM 01/21/2017    3:03 PM 11/23/2014    9:54 AM  Advanced Directives  Does Patient Have a Medical Advance Directive? No No No No No No No  Would patient like information on creating a medical advance directive?  No - Patient  declined No - Patient declined No - Patient declined No - Patient declined No - Patient declined Yes - Educational materials given    Current Medications (verified) Outpatient Encounter Medications as of 06/03/2023  Medication Sig   acetaminophen  (TYLENOL ) 650 MG CR tablet Take 1,300 mg by mouth every 8 (eight) hours as needed for pain.   allopurinol  (ZYLOPRIM ) 300 MG tablet Take 0.5 tablets (150 mg total) by mouth daily.   Ascorbic Acid (VITAMIN C) 1000 MG tablet Take 1,000 mg by mouth daily.   aspirin  81 MG tablet Take 81 mg by mouth daily.   atorvastatin  (LIPITOR ) 80 MG tablet Take 1 tablet (80 mg total) by mouth at bedtime.   benazepril  (LOTENSIN ) 20 MG tablet Take 1 tablet (20 mg total) by mouth daily.   blood glucose meter kit and supplies Dispense based on patient and insurance preference. Use to check fasting blood glucose in the morning and to check glucose 2 hours after largest meal of the day. (FOR ICD-10 E10.9, E11.9).   Cholecalciferol (VITAMIN D ) 50 MCG (2000 UT) tablet Take 2,000 Units by mouth daily.   empagliflozin  (JARDIANCE ) 25 MG TABS tablet Take 1 tablet (25 mg total) by mouth daily before breakfast.   ezetimibe  (ZETIA ) 10 MG tablet TAKE 1 TABLET BY MOUTH DAILY   FLUoxetine  (PROZAC ) 40 MG capsule Take 1 capsule (40 mg total) by mouth daily.   gabapentin  (NEURONTIN ) 800 MG tablet TAKE 1 TABLET BY MOUTH 3 TIMES DAILY   glipiZIDE  (GLUCOTROL ) 10 MG tablet Take 1 tablet (10 mg total) by mouth 2 (two)  times daily before a meal.   LANCETS ULTRA FINE MISC Use to check fasting blood glucose in the morning and to check glucose 2 hours after largest meal of the day   loratadine (CLARITIN) 10 MG tablet Take 10 mg by mouth daily.   Melatonin 10 MG CAPS Take 10 mg by mouth at bedtime.   metoprolol  succinate (TOPROL -XL) 25 MG 24 hr tablet TAKE 1/2 TABLET BY MOUTH DAILY   Multiple Vitamins-Iron  (MULTIVITAMINS WITH IRON ) TABS Take 1 tablet by mouth daily.   pantoprazole  (PROTONIX ) 40 MG  tablet TAKE 1 TABLET BY MOUTH DAILY   polyethylene glycol powder (GLYCOLAX /MIRALAX ) powder USE AS DIRECTED (Patient taking differently: Take 17 g by mouth daily. USE AS DIRECTED)   sildenafil (VIAGRA) 100 MG tablet Take by mouth as needed for erectile dysfunction.   No facility-administered encounter medications on file as of 06/03/2023.    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   LIMA to LAD, SVG to D1 and SVG to OM2     Hyperlipidemia    Hypertension    Insomnia    Obstructive sleep apnea    OSA (obstructive sleep apnea)    Past Surgical History:  Procedure Laterality Date   CARDIAC CATHETERIZATION     CARPAL TUNNEL RELEASE  09/10/2015   RIGHT WRIST / WITH NERVE IMPINGEMENT SURGERY   CERVICAL FUSION     Dr Cipriano Creeks   COLONOSCOPY  2007   negative;  GI   CORONARY ANGIOGRAPHY N/A 10/25/2018   Procedure: CORONARY ANGIOGRAPHY (CATH LAB);  Surgeon: Arnoldo Lapping, MD;  Location: [facility] INVASIVE CV LAB;  Service: Cardiovascular;  Laterality: N/A;   CORONARY ARTERY BYPASS GRAFT N/A 10/26/2018   Procedure: CORONARY ARTERY BYPASS GRAFTING (CABG) x3 using the LIMA to LAD and right greater saphenous vein via endoscopic harvest to the Diag 1 and OM 2.;  Surgeon: Hilarie Lovely, MD;  Location: [facility] OR;  Service: Open Heart Surgery;  Laterality: N/A;   ESOPHAGEAL DILATION  2007   FINGER AMPUTATION  2003   Left Index finger amputation & reattachment   LUMBAR FUSION      Dr Cipriano Creeks   UMBILICAL HERNIA  REPAIR N/A 04/03/2020   Procedure: HERNIA REPAIR UMBILICAL ADULT, open;  Surgeon: Emmalene Hare, MD;  Location: [facility] ORS;  Service: General;  Laterality: N/A;   Family History  Problem Relation Age of Onset   Aneurysm Mother 34       CNS aneurysm   Sudden death Mother    Heart disease Father        pacer   Urolithiasis Father    Heart attack Father    Diabetes Sister    Heart disease Sister    Heart disease Sister    Kidney disease Brother    Cancer Maternal Uncle        ? primary   Cancer Paternal Uncle        ?  primary   Hyperlipidemia Neg Hx    Hypertension Neg Hx    Colon cancer Neg Hx    Colon polyps Neg Hx    Esophageal cancer Neg Hx    Rectal cancer Neg Hx    Stomach cancer Neg Hx    Social History   Socioeconomic History   Marital status: Married    Spouse name: Not on file   Number of children: Not on file   Years of education: Not on file   Highest education level: Not on file  Occupational History   Occupation: Disabled  Tobacco Use   Smoking status: Former    Current packs/day: 0.00    Average packs/day: 1.5 packs/day for 4.0 years (6.0 ttl pk-yrs)    Types: Cigarettes    Start date: 01/06/1972    Quit date: 01/06/1976    Years since quitting: 47.4   Smokeless tobacco: Never   Tobacco comments:    smoked 35 years ago as of 2013   Vaping Use   Vaping status: Never Used  Substance and Sexual Activity   Alcohol use: No   Drug use: No   Sexual activity: Yes    Birth control/protection: None  Other Topics Concern   Not on file  Social History Narrative   Not on file   Social Drivers of Health   Financial Resource Strain: Low Risk  (06/03/2023)   Overall Financial Resource Strain (CARDIA)    Difficulty of Paying Living Expenses: Not hard at all  Food Insecurity: No Food Insecurity (06/03/2023)   Hunger Vital Sign    Worried About Running Out of Food in the Last Year: Never true    Ran Out of Food in the Last Year: Never true  Transportation Needs:  No Transportation Needs (06/03/2023)   PRAPARE - Administrator, Civil Service (Medical): No    Lack of Transportation (Non-Medical): No  Physical Activity: Insufficiently Active (06/03/2023)   Exercise Vital Sign    Days of Exercise per Week: 3 days    Minutes of Exercise per Session: 40 min  Stress: No Stress Concern Present (06/03/2023)   [employer] of Occupational Health - Occupational Stress Questionnaire    Feeling of Stress : Not at all  Social Connections: Moderately Integrated (06/03/2023)   Social Connection and Isolation Panel [NHANES]    Frequency of Communication with Friends and Family: More than three times a week    Frequency of Social Gatherings with Friends and Family: Never    Attends Religious Services: More than 4 times per year    Active Member of [employer] or Organizations: No    Attends Banker Meetings: Never    Marital Status: Married    Tobacco Counseling Counseling given: Not Answered Tobacco comments: smoked 35 years ago as of 2013     Clinical Intake:  Pre-visit preparation completed: Yes  Pain : No/denies pain     Nutritional Risks: None Diabetes: Yes CBG done?: No Did pt. bring in CBG monitor from home?: No  Lab Results  Component Value Date   HGBA1C 7.3 (H) 12/17/2022   HGBA1C 8.1 (H) 09/17/2022   HGBA1C 7.3 (H) 06/23/2022     How often do you need to have someone help you when you read instructions, pamphlets, or other written materials from your doctor or pharmacy?: 1 - Never  Interpreter Needed?: No  Information entered by :: NAllen LPN   Activities of Daily Living     06/03/2023  4:16 PM  In your present state of health, do you have any difficulty performing the following activities:  Hearing? 1  Comment has hearing aids  Vision? 0  Difficulty concentrating or making decisions? 0  Walking or climbing stairs? 0  Dressing or bathing? 0  Doing errands, shopping? 0  Preparing Food and eating ? N   Using the Toilet? N  In the past six months, have you accidently leaked urine? N  Do you have problems with loss of bowel control? N  Managing your Medications? N  Managing your Finances? N  Housekeeping or managing your Housekeeping? N    Patient Care Team: Melene Sportsman as PCP - General (Physician Assistant) Albert Huff, MD as Consulting Physician (Ophthalmology) Yvonna Herder, MD as Consulting Physician (Neurosurgery) Trudee Furth, MD (Inactive) as Consulting Physician (Urology) Janel Medford, MD (Inactive) as Attending Physician (Gastroenterology) Wilder Handy, MD (Inactive) as Consulting Physician (Pulmonary Disease) Shirlee Dotter, MD (Inactive) as Consulting Physician (Orthopedic Surgery) Salina Craver, MD as Consulting Physician (Sports Medicine) Nicolas Barren, MD as Consulting Physician (Nephrology) Hilarie Lovely, MD as Consulting Physician (Cardiothoracic Surgery) Gerald Kitty., NP as Nurse Practitioner (Cardiology)  Indicate any recent Medical Services you may have received from other than [facility] providers in the past year (date may be approximate).     Assessment:    This is a routine wellness examination for Barth.  Hearing/Vision screen Hearing Screening - Comments:: Has hearing aids that are maintained Vision Screening - Comments:: Regular eye exams, [facility]   Goals Addressed             This Visit's Progress    Patient Stated       06/03/2023, stay healthy       Depression Screen     06/03/2023    4:24 PM 12/24/2022    8:25 AM 09/24/2022    8:19 AM 06/23/2022    8:23 AM 05/14/2022    2:49 PM 01/08/2022    1:49 PM 10/13/2021   10:36 AM  PHQ 2/9 Scores  PHQ - 2 Score 0 0 0 0 0 1 0  PHQ- 9 Score 0 2 0   3     Fall Risk     06/03/2023    4:23 PM 06/23/2022    8:23 AM 05/14/2022    2:51 PM 01/08/2022    1:51 PM 05/23/2021    8:38 AM  Fall Risk   Falls in the past year? 1 0 0 0 0  Comment passed out      Number  falls in past yr: 0 0 0 0 0  Injury with Fall? 0 0 0 0 0  Risk for fall due to : Medication side effect No Fall Risks No Fall Risks  No Fall Risks  Follow up Falls evaluation completed;Falls prevention discussed Falls evaluation completed Falls prevention discussed  Falls evaluation completed    MEDICARE RISK AT HOME:  Medicare Risk at Home Any stairs in or around the home?: Yes If so, are there any without handrails?: No Home free of loose throw rugs in walkways, pet beds, electrical cords, etc?: Yes Adequate lighting in your home to reduce risk of falls?: Yes Life alert?: No Use of a cane, walker or w/c?: No Grab bars in the bathroom?: No Shower chair or bench in shower?: No Elevated toilet seat or a handicapped toilet?: Yes  TIMED UP AND GO:  Was the test performed?  No  Cognitive Function: 6CIT  completed        06/03/2023    4:25 PM 10/21/2020    2:57 PM  6CIT Screen  What Year? 0 points 0 points  What month? 0 points 0 points  What time? 0 points 0 points  Count back from 20 0 points 0 points  Months in reverse 4 points 2 points  Repeat phrase 0 points 4 points  Total Score 4 points 6 points    Immunizations Immunization History  Administered Date(s) Administered   Fluad Trivalent(High Dose 65+) 09/24/2022   Influenza Split 12/10/2010   Influenza,inj,Quad PF,6+ Mos 10/19/2012, 11/23/2014, 10/22/2015, 11/10/2016, 11/08/2017   Influenza,inj,quad, With Preservative 11/10/2016   Influenza-Unspecified 09/25/2018   PFIZER(Purple Top)SARS-COV-2 Vaccination 03/08/2019, 04/08/2019, 12/12/2019   Pneumococcal Conjugate-13 12/07/2014   Pneumococcal Polysaccharide-23 04/17/2016   Td 08/02/2008   Tdap 05/21/2020    Screening Tests Health Maintenance  Topic Date Due   Zoster Vaccines- Shingrix (1 of 2) Never done   Pneumonia Vaccine 56+ Years old (3 of 3 - PPSV23, PCV20 or PCV21) 04/17/2021   OPHTHALMOLOGY EXAM  08/08/2022   COVID-19 Vaccine (4 - 2024-25 season)  09/06/2022   HEMOGLOBIN A1C  06/17/2023   FOOT EXAM  06/23/2023   INFLUENZA VACCINE  08/06/2023   Diabetic kidney evaluation - Urine ACR  09/24/2023   Diabetic kidney evaluation - eGFR measurement  02/27/2024   Medicare Annual Wellness (AWV)  06/02/2024   Colonoscopy  10/23/2027   DTaP/Tdap/Td (3 - Td or Tdap) 05/22/2030   Hepatitis C Screening  Completed   HPV VACCINES  Aged Out   Meningococcal B Vaccine  Aged Out    Health Maintenance  Health Maintenance Due  Topic Date Due   Zoster Vaccines- Shingrix (1 of 2) Never done   Pneumonia Vaccine 51+ Years old (3 of 3 - PPSV23, PCV20 or PCV21) 04/17/2021   OPHTHALMOLOGY EXAM  08/08/2022   COVID-19 Vaccine (4 - 2024-25 season) 09/06/2022   Health Maintenance Items Addressed: Due for shingrix and covid. Patient states will make appointment with eye doctor.  Additional Screening:  Vision Screening: Recommended annual ophthalmology exams for early detection of glaucoma and other disorders of the eye.  Dental Screening: Recommended annual dental exams for proper oral hygiene  Community Resource Referral / Chronic Care Management: CRR required this visit?  No   CCM required this visit?  No   Plan:    I have personally reviewed and noted the following in the patient's chart:   Medical and social history Use of alcohol, tobacco or illicit drugs  Current medications and supplements including opioid prescriptions. Patient is not currently taking opioid prescriptions. Functional ability and status Nutritional status Physical activity Advanced directives List of other physicians Hospitalizations, surgeries, and ER visits in previous 12 months Vitals Screenings to include cognitive, depression, and falls Referrals and appointments  In addition, I have reviewed and discussed with patient certain preventive protocols, quality metrics, and best practice recommendations. A written personalized care plan for preventive services as  well as general preventive health recommendations were provided to patient.   ROLONDO PIERRE, LPN   1/61/0960   After Visit Summary: (MyChart) Due to this being a telephonic visit, the after visit summary with patients personalized plan was offered to patient via MyChart   Notes: Nothing significant to report at this time.

## 2023-06-03 NOTE — Patient Instructions (Signed)
 Mr. Galan , Thank you for taking time out of your busy schedule to complete your Annual Wellness Visit with me. I enjoyed our conversation and look forward to speaking with you again next year. I, as well as your care team,  appreciate your ongoing commitment to your health goals. Please review the following plan we discussed and let me know if I can assist you in the future. Your Game plan/ To Do List    Referrals: If you haven't heard from the office you've been referred to, please reach out to them at the phone provided.  N/a Follow up Visits: Next Medicare AWV with our clinical staff: 06/15/2024 at 4:20   Have you seen your provider in the last 6 months (3 months if uncontrolled diabetes)? Yes Next Office Visit with your provider: 07/13/2023 at 9:30  Clinician Recommendations:  Aim for 30 minutes of exercise or brisk walking, 6-8 glasses of water, and 5 servings of fruits and vegetables each day.       This is a list of the screening recommended for you and due dates:  Health Maintenance  Topic Date Due   Zoster (Shingles) Vaccine (1 of 2) Never done   Pneumonia Vaccine (3 of 3 - PPSV23, PCV20 or PCV21) 04/17/2021   Eye exam for diabetics  08/08/2022   COVID-19 Vaccine (4 - 2024-25 season) 09/06/2022   Hemoglobin A1C  06/17/2023   Complete foot exam   06/23/2023   Flu Shot  08/06/2023   Yearly kidney health urinalysis for diabetes  09/24/2023   Yearly kidney function blood test for diabetes  02/27/2024   Medicare Annual Wellness Visit  06/02/2024   Colon Cancer Screening  10/23/2027   DTaP/Tdap/Td vaccine (3 - Td or Tdap) 05/22/2030   Hepatitis C Screening  Completed   HPV Vaccine  Aged Out   Meningitis B Vaccine  Aged Out    Advanced directives: (ACP Link)Information on Advanced Care Planning can be found at [place]  Secretary of [facility] Advance Health Care Directives Advance Health Care Directives. [email or web address]  Advance Care Planning is important because it:  [x]  Makes sure  you receive the medical care that is consistent with your values, goals, and preferences  [x]  It provides guidance to your family and loved ones and reduces their decisional burden about whether or not they are making the right decisions based on your wishes.  Follow the link provided in your after visit summary or read over the paperwork we have mailed to you to help you started getting your Advance Directives in place. If you need assistance in completing these, please reach out to us  so that we can help you!  See attachments for Preventive Care and Fall Prevention Tips.

## 2023-06-30 ENCOUNTER — Other Ambulatory Visit: Payer: Self-pay | Admitting: Family Medicine

## 2023-06-30 DIAGNOSIS — R12 Heartburn: Secondary | ICD-10-CM

## 2023-06-30 DIAGNOSIS — F39 Unspecified mood [affective] disorder: Secondary | ICD-10-CM

## 2023-07-13 ENCOUNTER — Ambulatory Visit (INDEPENDENT_AMBULATORY_CARE_PROVIDER_SITE_OTHER)

## 2023-07-13 VITALS — BP 122/80 | HR 58 | Temp 97.6°F | Ht 71.0 in | Wt 238.1 lb

## 2023-07-13 DIAGNOSIS — E1121 Type 2 diabetes mellitus with diabetic nephropathy: Secondary | ICD-10-CM

## 2023-07-13 DIAGNOSIS — N529 Male erectile dysfunction, unspecified: Secondary | ICD-10-CM | POA: Insufficient documentation

## 2023-07-13 DIAGNOSIS — E1129 Type 2 diabetes mellitus with other diabetic kidney complication: Secondary | ICD-10-CM | POA: Diagnosis not present

## 2023-07-13 DIAGNOSIS — I152 Hypertension secondary to endocrine disorders: Secondary | ICD-10-CM

## 2023-07-13 DIAGNOSIS — J42 Unspecified chronic bronchitis: Secondary | ICD-10-CM | POA: Insufficient documentation

## 2023-07-13 DIAGNOSIS — N183 Chronic kidney disease, stage 3 unspecified: Secondary | ICD-10-CM

## 2023-07-13 DIAGNOSIS — E1122 Type 2 diabetes mellitus with diabetic chronic kidney disease: Secondary | ICD-10-CM | POA: Diagnosis not present

## 2023-07-13 DIAGNOSIS — E1159 Type 2 diabetes mellitus with other circulatory complications: Secondary | ICD-10-CM

## 2023-07-13 DIAGNOSIS — N184 Chronic kidney disease, stage 4 (severe): Secondary | ICD-10-CM

## 2023-07-13 DIAGNOSIS — Z7984 Long term (current) use of oral hypoglycemic drugs: Secondary | ICD-10-CM

## 2023-07-13 LAB — POCT GLYCOSYLATED HEMOGLOBIN (HGB A1C): Hemoglobin A1C: 8.4 % — AB (ref 4.0–5.6)

## 2023-07-13 MED ORDER — EMPAGLIFLOZIN 25 MG PO TABS: 25.0000 mg | ORAL_TABLET | Freq: Every day | ORAL | 1 refills | Status: DC

## 2023-07-13 MED ORDER — RYBELSUS 3 MG PO TABS: 3.0000 mg | ORAL_TABLET | Freq: Every day | ORAL | 3 refills | Status: DC

## 2023-07-13 MED ORDER — SILDENAFIL CITRATE 100 MG PO TABS: 100.0000 mg | ORAL_TABLET | ORAL | 3 refills | Status: AC | PRN

## 2023-07-13 NOTE — Assessment & Plan Note (Signed)
 BP goal <130/80.  Stable, at goal today.  Continue benazepril  10 mg daily, metoprolol  12.5 mg daily.  Will continue to monitor.

## 2023-07-13 NOTE — Progress Notes (Signed)
 Established Patient Office Visit  Subjective   Patient ID: [name] Kimberlin Sr., male    DOB: [birth year]  Age: 68 y.o. MRN: [id number]  Chief Complaint  Patient presents with  . Medical Management of Chronic Issues    HPI  [name] Andujo Sr. is a 68 y.o. y/o male who presents to the clinic today for follow up on HTN, DM.   HTN: Patient currently taking benazepril  and metoprolol  for control of their blood pressure. Reports excellence compliance with this medication. Denies side effects or episodes of hypotension. Checks BP at home periodically. Denies CP, SOB, Palpitations, vision changes, HA, or edema.  DM: Patient currently takes Jardiance , glipizide ,  for management of their diabetes. Reports that they are taking this medication as prescribed. Reports that his diet has not been great lately and he has been eating a lot of high sugar foods. Denies side effects. Denies episodes of hypoglycemia. Denies new sores/wounds that are not healing.      ROS Per HPI.    Objective:     BP 122/80   Pulse (!) 58   Temp 97.6 F (36.4 C) (Oral)   Ht 5' 11 (1.803 m)   Wt 238 lb 1.3 oz (108 kg)   SpO2 90%   BMI 33.21 kg/m    Physical Exam Constitutional:      General: He is not in acute distress.    Appearance: Normal appearance.  Cardiovascular:     Rate and Rhythm: Normal rate and regular rhythm.     Pulses:          Dorsalis pedis pulses are 2+ on the right side and 2+ on the left side.     Heart sounds: Normal heart sounds. No murmur heard.    No friction rub. No gallop.  Pulmonary:     Effort: Pulmonary effort is normal. No respiratory distress.     Breath sounds: Normal breath sounds.  Musculoskeletal:        General: No swelling.  Feet:     Right foot:     Protective Sensation: 10 sites tested.  10 sites sensed.     Left foot:     Protective Sensation: 10 sites tested.  10 sites sensed.  Skin:    General: Skin is warm and dry.  Neurological:     General: No focal  deficit present.     Mental Status: He is alert.  Psychiatric:        Mood and Affect: Mood normal.        Behavior: Behavior normal.        Thought Content: Thought content normal.      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  Continue Jardiance  25 mg daily, glipizide  10  mg twice daily before meals.  Have also added Rybelsus  3 mg to his current medication regimen.  Encourage patient to complete his eye exam within the calendar year.  Foot exam completed today.  UACR up-to-date.  Will follow-up on diabetes in 3 months.  A1c is still significantly elevated, will increase dose of Rybelsus .  Patient has trialed metformin  and Ozempic , both of which caused stomach upset/nausea and patient is not open to retrying injectables at this time.  Orders: -     POCT glycosylated hemoglobin (Hb A1C) -     Rybelsus ; Take 1 tablet (3 mg total) by mouth daily.  Dispense: 30 tablet; Refill: 3 -     Empagliflozin ; Take 1 tablet (25 mg total) by mouth daily before breakfast.  Dispense: 90 tablet; Refill: 1  Hypertension associated with diabetes (HCC) Assessment & Plan: BP goal <130/80.  Stable, at goal today.  Continue benazepril  10 mg daily, metoprolol  12.5 mg daily.  Will continue to monitor.  Orders: -     Empagliflozin ; Take 1 tablet (25 mg total) by mouth daily before  breakfast.  Dispense: 90 tablet; Refill: 1  Stage 3 chronic kidney disease, unspecified whether stage 3a or 3b CKD (HCC) Assessment & Plan: Most recent EGFR 46.  Continue Jardiance  25 mg daily and benazepril  10 mg daily for kidney protection.  Discussed with patient the importance of tight A1c control to prevent further damage to the kidneys.  For this reason, have added Rybelsus  3 mg tablets to help improve A1c.  Plan to recheck A1c in 3 months and will update CMP and UACR at that time.  Orders: -     Empagliflozin ; Take 1 tablet (25 mg total) by mouth daily before breakfast.  Dispense: 90 tablet; Refill: 1  Erectile dysfunction, unspecified erectile dysfunction type Assessment & Plan: Have refilled sildenafil  100 mg to take as needed for erectile dysfunction.  Advised patient not to exceed more than 1 tablet in a 24-hour.  Discussed that his SSRI can also be a potential cause.  Will continue to monitor.   Other orders -     Sildenafil  Citrate; Take 1 tablet (100 mg total) by mouth as needed for erectile dysfunction.  Dispense: 10 tablet; Refill: 3   Return in about 3 months (around 10/13/2023) for Physical, DM.    [name] JULIANNA Sacks, PA-C

## 2023-07-13 NOTE — Assessment & Plan Note (Signed)
 A1c has increased from 7.3-8.4.  Continue Jardiance  25 mg daily, glipizide  10 mg twice daily before meals.  Have also added Rybelsus  3 mg to his current medication regimen.  Encourage patient to complete his eye exam within the calendar year.  Foot exam completed today.  UACR up-to-date.  Will follow-up on diabetes in 3 months.  A1c is still significantly elevated, will increase dose of Rybelsus .  Patient has trialed metformin  and Ozempic , both of which caused stomach upset/nausea and patient is not open to retrying injectables at this time.

## 2023-07-13 NOTE — Assessment & Plan Note (Signed)
 Most recent EGFR 46.  Continue Jardiance  25 mg daily and benazepril  10 mg daily for kidney protection.  Discussed with patient the importance of tight A1c control to prevent further damage to the kidneys.  For this reason, have added Rybelsus  3 mg tablets to help improve A1c.  Plan to recheck A1c in 3 months and will update CMP and UACR at that time.

## 2023-07-13 NOTE — Patient Instructions (Signed)
 It was nice to see you today!  As we discussed in clinic:  -I have added Rybelsus  3 mg, which is an oral tablet for diabetes. Take this medication in the mornings with food.  -I have also refilled your Viagra  100 mg to use as needed for erectile dysfunction. Do not exceed more than 1 tablet in a 24 hour period.  -Continue all of your other current medications.  I will plan on seeing you back in 3 months for physical and we will follow up on A1c at that time. It was nice to meet you!  If you have any problems before your next visit feel free to message me via MyChart (minor issues or questions) or call the office, otherwise you may reach out to schedule an office visit.  Thank you! Saddie Sacks, PA-C

## 2023-07-13 NOTE — Assessment & Plan Note (Signed)
 Have refilled sildenafil  100 mg to take as needed for erectile dysfunction.  Advised patient not to exceed more than 1 tablet in a 24-hour.  Discussed that his SSRI can also be a potential cause.  Will continue to monitor.

## 2023-08-02 ENCOUNTER — Other Ambulatory Visit: Payer: Self-pay | Admitting: Family Medicine

## 2023-08-02 DIAGNOSIS — I152 Hypertension secondary to endocrine disorders: Secondary | ICD-10-CM

## 2023-08-10 ENCOUNTER — Other Ambulatory Visit: Payer: Self-pay | Admitting: Family Medicine

## 2023-08-23 ENCOUNTER — Other Ambulatory Visit: Payer: Self-pay | Admitting: Nurse Practitioner

## 2023-09-27 ENCOUNTER — Other Ambulatory Visit: Payer: Self-pay | Admitting: Family Medicine

## 2023-09-27 ENCOUNTER — Other Ambulatory Visit: Payer: Self-pay | Admitting: Nurse Practitioner

## 2023-09-27 DIAGNOSIS — E1129 Type 2 diabetes mellitus with other diabetic kidney complication: Secondary | ICD-10-CM

## 2023-10-03 ENCOUNTER — Other Ambulatory Visit: Payer: Self-pay

## 2023-10-03 DIAGNOSIS — N183 Chronic kidney disease, stage 3 unspecified: Secondary | ICD-10-CM

## 2023-10-03 DIAGNOSIS — E1169 Type 2 diabetes mellitus with other specified complication: Secondary | ICD-10-CM

## 2023-10-03 DIAGNOSIS — I152 Hypertension secondary to endocrine disorders: Secondary | ICD-10-CM

## 2023-10-03 DIAGNOSIS — E1129 Type 2 diabetes mellitus with other diabetic kidney complication: Secondary | ICD-10-CM

## 2023-10-07 ENCOUNTER — Other Ambulatory Visit

## 2023-10-07 DIAGNOSIS — E1169 Type 2 diabetes mellitus with other specified complication: Secondary | ICD-10-CM

## 2023-10-07 DIAGNOSIS — N183 Chronic kidney disease, stage 3 unspecified: Secondary | ICD-10-CM

## 2023-10-07 DIAGNOSIS — I152 Hypertension secondary to endocrine disorders: Secondary | ICD-10-CM

## 2023-10-07 DIAGNOSIS — E1129 Type 2 diabetes mellitus with other diabetic kidney complication: Secondary | ICD-10-CM

## 2023-10-08 LAB — CBC WITH DIFFERENTIAL/PLATELET
Basophils Absolute: 0 x10E3/uL (ref 0.0–0.2)
Basos: 0 %
EOS (ABSOLUTE): 0.1 x10E3/uL (ref 0.0–0.4)
Eos: 2 %
Hematocrit: 43.8 % (ref 37.5–51.0)
Hemoglobin: 14 g/dL (ref 13.0–17.7)
Immature Grans (Abs): 0 x10E3/uL (ref 0.0–0.1)
Immature Granulocytes: 0 %
Lymphocytes Absolute: 1 x10E3/uL (ref 0.7–3.1)
Lymphs: 20 %
MCH: 27.6 pg (ref 26.6–33.0)
MCHC: 32 g/dL (ref 31.5–35.7)
MCV: 86 fL (ref 79–97)
Monocytes Absolute: 0.5 x10E3/uL (ref 0.1–0.9)
Monocytes: 9 %
Neutrophils Absolute: 3.4 x10E3/uL (ref 1.4–7.0)
Neutrophils: 69 %
Platelets: 253 x10E3/uL (ref 150–450)
RBC: 5.08 x10E6/uL (ref 4.14–5.80)
RDW: 14.6 % (ref 11.6–15.4)
WBC: 5 x10E3/uL (ref 3.4–10.8)

## 2023-10-08 LAB — COMPREHENSIVE METABOLIC PANEL WITH GFR
ALT: 24 IU/L (ref 0–44)
AST: 29 IU/L (ref 0–40)
Albumin: 4.6 g/dL (ref 3.9–4.9)
Alkaline Phosphatase: 122 IU/L (ref 47–123)
BUN/Creatinine Ratio: 11 (ref 10–24)
BUN: 25 mg/dL (ref 8–27)
Bilirubin Total: 0.5 mg/dL (ref 0.0–1.2)
CO2: 22 mmol/L (ref 20–29)
Calcium: 9.8 mg/dL (ref 8.6–10.2)
Chloride: 101 mmol/L (ref 96–106)
Creatinine, Ser: 2.24 mg/dL — ABNORMAL HIGH (ref 0.76–1.27)
Globulin, Total: 3 g/dL (ref 1.5–4.5)
Glucose: 148 mg/dL — ABNORMAL HIGH (ref 70–99)
Potassium: 5.3 mmol/L — ABNORMAL HIGH (ref 3.5–5.2)
Sodium: 138 mmol/L (ref 134–144)
Total Protein: 7.6 g/dL (ref 6.0–8.5)
eGFR: 31 mL/min/1.73 — ABNORMAL LOW (ref >59)

## 2023-10-08 LAB — LIPID PANEL
Chol/HDL Ratio: 3 ratio (ref 0.0–5.0)
Cholesterol, Total: 110 mg/dL (ref 100–199)
HDL: 37 mg/dL — ABNORMAL LOW (ref >39)
LDL Chol Calc (NIH): 53 mg/dL (ref 0–99)
Triglycerides: 109 mg/dL (ref 0–149)
VLDL Cholesterol Cal: 20 mg/dL (ref 5–40)

## 2023-10-08 LAB — HEMOGLOBIN A1C
Est. average glucose Bld gHb Est-mCnc: 189 mg/dL
Hgb A1c MFr Bld: 8.2 % — ABNORMAL HIGH (ref 4.8–5.6)

## 2023-10-08 LAB — TSH: TSH: 2.57 u[IU]/mL (ref 0.450–4.500)

## 2023-10-08 LAB — VITAMIN D 25 HYDROXY (VIT D DEFICIENCY, FRACTURES): Vit D, 25-Hydroxy: 56.8 ng/mL (ref 30.0–100.0)

## 2023-10-11 ENCOUNTER — Ambulatory Visit: Payer: Self-pay

## 2023-10-11 ENCOUNTER — Other Ambulatory Visit: Payer: Self-pay

## 2023-10-11 MED ORDER — RYBELSUS 7 MG PO TABS: 7.0000 mg | ORAL_TABLET | Freq: Every day | ORAL | 2 refills | Status: DC

## 2023-10-11 NOTE — Progress Notes (Signed)
rybe

## 2023-10-14 ENCOUNTER — Ambulatory Visit

## 2023-10-14 VITALS — BP 121/75 | HR 52 | Temp 97.5°F | Ht 71.0 in | Wt 234.0 lb

## 2023-10-14 DIAGNOSIS — E1169 Type 2 diabetes mellitus with other specified complication: Secondary | ICD-10-CM | POA: Diagnosis not present

## 2023-10-14 DIAGNOSIS — Z7984 Long term (current) use of oral hypoglycemic drugs: Secondary | ICD-10-CM

## 2023-10-14 DIAGNOSIS — E785 Hyperlipidemia, unspecified: Secondary | ICD-10-CM

## 2023-10-14 DIAGNOSIS — Z23 Encounter for immunization: Secondary | ICD-10-CM | POA: Diagnosis not present

## 2023-10-14 DIAGNOSIS — I152 Hypertension secondary to endocrine disorders: Secondary | ICD-10-CM | POA: Diagnosis not present

## 2023-10-14 DIAGNOSIS — N183 Chronic kidney disease, stage 3 unspecified: Secondary | ICD-10-CM

## 2023-10-14 DIAGNOSIS — E1129 Type 2 diabetes mellitus with other diabetic kidney complication: Secondary | ICD-10-CM

## 2023-10-14 DIAGNOSIS — E1159 Type 2 diabetes mellitus with other circulatory complications: Secondary | ICD-10-CM

## 2023-10-14 LAB — POCT UA - MICROALBUMIN
Creatinine, POC: 100 mg/dL
Microalbumin Ur, POC: 80 mg/L

## 2023-10-14 NOTE — Assessment & Plan Note (Addendum)
 Suboptimal control with slight improvement in A1c from 8.4% to 8.2%. - Start Rybelsus  7 mg, take half tablet daily for two weeks, then full tablet if tolerated. - Continue Jardiance  and glipizide . - Recheck A1c in three months. - Foot exam UTD. Eye exam UTD (will request records). UACR updating today.

## 2023-10-14 NOTE — Patient Instructions (Signed)
 VISIT SUMMARY: Today, we reviewed your diabetes, hypertension, cholesterol, kidney function, and other health conditions. We made some adjustments to your diabetes medication and provided instructions for monitoring your health.  YOUR PLAN: TYPE 2 DIABETES MELLITUS: Your blood sugar levels are not as well controlled as we would like, but there has been a slight improvement. -Start taking Rybelsus  7 mg. Take half a tablet daily for two weeks, then a full tablet if you tolerate it well. -Continue taking Jardiance  and glipizide  as you have been. -We will recheck your A1c in three months to see how you are doing.  CHRONIC KIDNEY DISEASE: Your kidney function has slightly deviated from your baseline, but you are following up with your nephrologist. -We need a urine sample to check for microalbuminuria. -Make sure you are drinking enough water to stay hydrated.  HYPERTENSION: Your blood pressure is well-controlled with your current medications. -Continue taking metoprolol , benazepril , and Jardiance  as prescribed.  HYPERLIPIDEMIA: Your cholesterol levels are well-controlled with your current medications. -Continue taking atorvastatin  and Zetia  as prescribed.  GASTROESOPHAGEAL REFLUX DISEASE: Your reflux is managed with pantoprazole . -Continue taking pantoprazole  as prescribed.  If you have any problems before your next visit feel free to message me via MyChart (minor issues or questions) or call the office, otherwise you may reach out to schedule an office visit.  Thank you! Saddie Sacks, PA-C

## 2023-10-14 NOTE — Assessment & Plan Note (Signed)
 BP goal <130/80.  Stable, at goal today.  Continue benazepril  10 mg daily, metoprolol  12.5 mg daily.  Will continue to monitor.

## 2023-10-14 NOTE — Progress Notes (Signed)
 Established Patient Office Visit  Subjective   Patient ID: [name] Beaumier Sr., male    DOB: 07-16-1955  Age: 68 y.o. MRN: [id number]  Chief Complaint  Patient presents with   Annual Exam    HPI   History of Present Illness   [name] Heffern Sr. is a 68 year old male with diabetes who presents for medication management.  Hyperglycemia and diabetes management - Has not been taking prescribed Rybelsus  3 mg due to cost and availability issues at the pharmacy - Recently obtained Rybelsus  7 mg (picked up yesterday) and plans to start it - Currently taking Jardiance  and glipizide  BID   Hypertension management - Takes benazepril  and metoprolol   Dyslipidemia management - Takes atorvastatin  and Zetia  for cholesterol control  Chronic kidney disease monitoring - History of kidney issues - Follows up regularly with a nephrologist, [facility] - Monitors kidney function closely, especially in the context of diabetes  Gastroesophageal reflux disease - Takes pantoprazole  for reflux  Mood disorder management - Takes gabapentin  and fluoxetine  for mood - Mood is improved with current regimen  Antiplatelet therapy - Takes aspirin  as part of medication regimen  Ophthalmologic care - Recently had an eye exam - Obtained new glasses from Dr. Ladora at [facility]        ROS Per HPI.    Objective:     BP 121/75   Pulse (!) 52   Temp (!) 97.5 F (36.4 C) (Oral)   Ht 5' 11 (1.803 m)   Wt 234 lb (106.1 kg)   SpO2 97%   BMI 32.64 kg/m    Physical Exam Constitutional:      General: He is not in acute distress.    Appearance: Normal appearance.  Cardiovascular:     Rate and Rhythm: Normal rate and regular rhythm.     Heart sounds: Normal heart sounds. No murmur heard.    No friction rub. No gallop.  Pulmonary:     Effort: Pulmonary effort is normal. No respiratory distress.     Breath sounds: Normal breath sounds.  Musculoskeletal:        General: No swelling.      Cervical back: Neck supple.  Lymphadenopathy:     Cervical: No cervical adenopathy.  Skin:    General: Skin is warm and dry.  Neurological:     General: No focal deficit present.     Mental Status: He is alert.  Psychiatric:        Mood and Affect: Mood normal.        Behavior: Behavior normal.        Thought Content: Thought content normal.      Results for orders placed or performed in visit on 10/14/23  POCT UA - Microalbumin  Result Value Ref Range   Microalbumin Ur, POC 80 mg/L   Creatinine, POC 100 mg/dL   Albumin /Creatinine Ratio, Urine, POC 30-300     Last CBC Lab Results  Component Value Date   WBC 5.0 10/07/2023   HGB 14.0 10/07/2023   HCT 43.8 10/07/2023   MCV 86 10/07/2023   MCH 27.6 10/07/2023   RDW 14.6 10/07/2023   PLT 253 10/07/2023   Last metabolic panel Lab Results  Component Value Date   GLUCOSE 148 (H) 10/07/2023   NA 138 10/07/2023   K 5.3 (H) 10/07/2023   CL 101 10/07/2023   CO2 22 10/07/2023   BUN 25 10/07/2023   CREATININE 2.24 (H) 10/07/2023   EGFR 31 (L) 10/07/2023  CALCIUM  9.8 10/07/2023   PHOS 3.4 08/30/2018   PROT 7.6 10/07/2023   ALBUMIN  4.6 10/07/2023   LABGLOB 3.0 10/07/2023   AGRATIO 1.6 01/22/2021   BILITOT 0.5 10/07/2023   ALKPHOS 122 10/07/2023   AST 29 10/07/2023   ALT 24 10/07/2023   ANIONGAP 7 04/01/2020   Last lipids Lab Results  Component Value Date   CHOL 110 10/07/2023   HDL 37 (L) 10/07/2023   LDLCALC 53 10/07/2023   TRIG 109 10/07/2023   CHOLHDL 3.0 10/07/2023   Last hemoglobin A1c Lab Results  Component Value Date   HGBA1C 8.2 (H) 10/07/2023   Last thyroid  functions Lab Results  Component Value Date   TSH 2.570 10/07/2023   Last vitamin D  Lab Results  Component Value Date   VD25OH 56.8 10/07/2023      The ASCVD Risk score (Arnett DK, et al., 2019) failed to calculate for the following reasons:   The valid total cholesterol range is 130 to 320 mg/dL    Assessment & Plan:   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 - Start Rybelsus  7 mg, take half tablet daily for two weeks, then full tablet if tolerated. - Continue Jardiance  and glipizide . - Recheck A1c in three months. - Foot exam UTD. Eye exam UTD (will request records). UACR updating today.   Orders: -     POCT UA - Microalbumin  Encounter for vaccination -     Flu vaccine HIGH DOSE PF(Fluzone Trivalent)  Stage 3 chronic kidney disease, unspecified whether stage 3a or 3b CKD (HCC) Assessment & Plan: Most recent GFR 31, creatinine 2.24. Slightly below his baseline. Continue Jardiance  25 mg daily and benazepril  10 mg daily for kidney protection. Discussed with patient the importance of tight A1c control to prevent further damage to the kidneys. For this reason, have added Rybelsus  7 mg tablets to help improve A1c. Plan to recheck A1c and CMP in 3 months.   Hyperlipidemia associated with type 2 diabetes mellitus (HCC) Assessment & Plan: Last lipid panel: LDL 53, HDL 37, Trig 109. The ASCVD Risk score (Arnett DK, et al., 2019) failed to calculate for the following reasons:   The valid total cholesterol range is 130 to 320 mg/dL At goal. Hepatic function within normal limits. Continue atorvastatin  80 mg daily. Will continue to monitor.    Hypertension associated with diabetes (HCC) Assessment & Plan: BP goal <130/80.  Stable, at goal today.  Continue benazepril  10 mg daily, metoprolol  12.5 mg daily.  Will continue to monitor.      Return in about 3 months (around 01/14/2024) for DM, HTN, HLD, Mood.    [name] JULIANNA Sacks, PA-C

## 2023-10-14 NOTE — Assessment & Plan Note (Signed)
 Last lipid panel: LDL 53, HDL 37, Trig 109. The ASCVD Risk score (Arnett DK, et al., 2019) failed to calculate for the following reasons:   The valid total cholesterol range is 130 to 320 mg/dL At goal. Hepatic function within normal limits. Continue atorvastatin  80 mg daily. Will continue to monitor.

## 2023-10-14 NOTE — Assessment & Plan Note (Signed)
 Most recent GFR 31, creatinine 2.24. Slightly below his baseline. Continue Jardiance  25 mg daily and benazepril  10 mg daily for kidney protection. Discussed with patient the importance of tight A1c control to prevent further damage to the kidneys. For this reason, have added Rybelsus  7 mg tablets to help improve A1c. Plan to recheck A1c and CMP in 3 months.

## 2023-11-01 ENCOUNTER — Other Ambulatory Visit: Payer: Self-pay | Admitting: Family Medicine

## 2023-11-01 DIAGNOSIS — M109 Gout, unspecified: Secondary | ICD-10-CM

## 2023-11-09 ENCOUNTER — Other Ambulatory Visit: Payer: Self-pay | Admitting: Nurse Practitioner

## 2023-11-23 ENCOUNTER — Other Ambulatory Visit: Payer: Self-pay | Admitting: Nurse Practitioner

## 2023-11-29 ENCOUNTER — Encounter: Payer: Self-pay | Admitting: *Deleted

## 2023-11-30 ENCOUNTER — Other Ambulatory Visit: Payer: Self-pay | Admitting: *Deleted

## 2023-11-30 ENCOUNTER — Ambulatory Visit: Attending: Internal Medicine | Admitting: Internal Medicine

## 2023-11-30 ENCOUNTER — Encounter: Payer: Self-pay | Admitting: Internal Medicine

## 2023-11-30 VITALS — BP 112/70 | HR 59 | Ht 71.5 in | Wt 237.7 lb

## 2023-11-30 DIAGNOSIS — I10 Essential (primary) hypertension: Secondary | ICD-10-CM | POA: Diagnosis not present

## 2023-11-30 DIAGNOSIS — E1129 Type 2 diabetes mellitus with other diabetic kidney complication: Secondary | ICD-10-CM

## 2023-11-30 DIAGNOSIS — G4733 Obstructive sleep apnea (adult) (pediatric): Secondary | ICD-10-CM | POA: Diagnosis not present

## 2023-11-30 DIAGNOSIS — N289 Disorder of kidney and ureter, unspecified: Secondary | ICD-10-CM | POA: Diagnosis not present

## 2023-11-30 DIAGNOSIS — I25110 Atherosclerotic heart disease of native coronary artery with unstable angina pectoris: Secondary | ICD-10-CM | POA: Diagnosis not present

## 2023-11-30 DIAGNOSIS — Z951 Presence of aortocoronary bypass graft: Secondary | ICD-10-CM

## 2023-11-30 LAB — BASIC METABOLIC PANEL WITH GFR
BUN/Creatinine Ratio: 16 (ref 10–24)
BUN: 36 mg/dL — ABNORMAL HIGH (ref 8–27)
CO2: 19 mmol/L — ABNORMAL LOW (ref 20–29)
Calcium: 10.1 mg/dL (ref 8.6–10.2)
Chloride: 102 mmol/L (ref 96–106)
Creatinine, Ser: 2.29 mg/dL — ABNORMAL HIGH (ref 0.76–1.27)
Glucose: 136 mg/dL — ABNORMAL HIGH (ref 70–99)
Potassium: 5.2 mmol/L (ref 3.5–5.2)
Sodium: 137 mmol/L (ref 134–144)
eGFR: 30 mL/min/1.73 — ABNORMAL LOW (ref >59)

## 2023-11-30 MED ORDER — EZETIMIBE 10 MG PO TABS
10.0000 mg | ORAL_TABLET | Freq: Every day | ORAL | 3 refills | Status: AC
Start: 2023-11-30 — End: ?

## 2023-11-30 MED ORDER — ATORVASTATIN CALCIUM 80 MG PO TABS: 80.0000 mg | ORAL_TABLET | Freq: Every day | ORAL | 3 refills | Status: AC

## 2023-11-30 MED ORDER — METOPROLOL SUCCINATE ER 25 MG PO TB24
12.5000 mg | ORAL_TABLET | Freq: Every day | ORAL | 3 refills | Status: AC
Start: 2023-11-30 — End: ?

## 2023-11-30 NOTE — Patient Instructions (Signed)
 Medication Instructions:   No changes  Please contact the office of which medication needs to be refilled  *If you need a refill on your cardiac medications before your next appointment, please call your pharmacy*   Lab Work: BMP If you have labs (blood work) drawn today and your tests are completely normal, you will receive your results only by: MyChart Message (if you have MyChart) OR A paper copy in the mail If you have any lab test that is abnormal or we need to change your treatment, we will call you to review the results.   Testing/Procedures: Not needed   Follow-Up: At [facility], you and your health needs are our priority.  As part of our continuing mission to provide you with exceptional heart care, we have created designated Provider Care Teams.  These Care Teams include your primary Cardiologist (physician) and Advanced Practice Providers (APPs -  Physician Assistants and Nurse Practitioners) who all work together to provide you with the care you need, when you need it.     Your next appointment:   12 month(s)  The format for your next appointment:   In Person  Provider:   Emeline FORBES Calender, MD   Other Instruction   Please contact the office of which medication needs to be refilled

## 2023-11-30 NOTE — Telephone Encounter (Signed)
 Patient was in office for office visit    Medication was refilled  - Zetia                                            Atorvastatin        Metoptolol

## 2023-11-30 NOTE — Progress Notes (Signed)
 Cardiology Office Note   Date:  11/30/2023  ID:  [name] Schnorr Sr., DOB 15-May-1955, MRN [id number] PCP: Gayle Saddie JULIANNA DEVONNA  [facility] Providers Cardiologist:  Emeline FORBES Calender, MD Cardiology APP:  Wyn Jackee VEAR Mickey., NP     History of Present Illness [name] Shampine Sr. is a 68 y.o. male who is a prior Dr. 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  At his last visit on 09/03/2022 with Jackee Wyn, NP, his blood pressure was well-controlled and he was continued on Lotensin  20 mg and metoprolol  succinate was restarted at 12.5 mg.  He was continued on his Lipitor , aspirin  and exercise regimen.  Today he states that he is just had more fatigue lately but otherwise denies any complaints of chest pain, shortness of breath, swelling or any other issues.  He needs refills today but is not sure which meds.   ROS:  Review of Systems  All other systems reviewed and are negative.   Physical Exam  Physical Exam Vitals and nursing note reviewed.  Constitutional:      Appearance: Normal appearance.  HENT:     Head: Normocephalic and atraumatic.  Eyes:     Conjunctiva/sclera: Conjunctivae normal.  Neck:     Vascular: No carotid bruit.  Cardiovascular:     Rate and Rhythm: Normal rate and regular rhythm.  Pulmonary:     Effort: Pulmonary effort is normal.     Breath sounds: Normal breath sounds.  Musculoskeletal:        General: No swelling or tenderness.  Skin:    Coloration: Skin is not jaundiced or pale.  Neurological:     Mental Status: He is alert.     VS:  BP 112/70   Pulse (!) 59   Ht 5' 11.5 (1.816 m)   Wt 237 lb 11.2 oz (107.8 kg)   SpO2 92%   BMI 32.69 kg/m         Wt Readings from Last 3 Encounters:  11/30/23 237 lb 11.2 oz (107.8 kg)  10/14/23 234 lb (106.1 kg)  07/13/23 238 lb 1.3 oz (108 kg)     EKG  Interpretation Date/Time:  Tuesday November 30 2023 08:29:20 EST Ventricular Rate:  59 PR Interval:  158 QRS Duration:  86 QT Interval:  402 QTC Calculation: 397 R Axis:   116  Text Interpretation: Sinus bradycardia Right axis deviation Nonspecific T wave abnormality When compared with ECG of 27-Oct-2018 06:56, QRS axis Shifted right ST no longer elevated in Anterolateral leads Nonspecific T wave abnormality has replaced inverted T waves in Inferior leads T wave inversion now evident in Anterior leads Confirmed by Calender Emeline [phone]) on 11/30/2023 8:44:45 AM    Studies Reviewed   Echocardiogram 03/26/2022:   1. Left ventricular ejection fraction, by estimation, is 55 to 60%. The  left ventricle has normal function. The left ventricle has no regional  wall motion abnormalities. Left ventricular diastolic parameters are  consistent with Grade I diastolic  dysfunction (impaired relaxation).   2. Right ventricular systolic function is normal. The right ventricular  size is normal.   3. The mitral valve is normal in structure. Trivial mitral valve  regurgitation. No evidence of mitral stenosis.   4. The aortic valve is tricuspid. Aortic valve regurgitation is not  visualized. No aortic stenosis is present.  5. The inferior vena cava is normal in size with greater than 50%  respiratory variability, suggesting right atrial pressure of 3 mmHg.       Risk Assessment/Calculations             ASCVD risk score: The ASCVD Risk score (Arnett DK, et al., 2019) failed to calculate for the following reasons:   The valid total cholesterol range is 130 to 320 mg/dL   ASSESSMENT  Fatigue possibly medication induced (gabapentin ) in the setting of CKD versus OSA versus other.  Unlikely cardiac equivalent as this is not exertional and is new onset. 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  He states he is followed with nephrology however I do not see any recent Hyperkalemia K5.3 on labs from 10 2 Hypertension well-controlled on benazepril  20 mg, metoprolol  succinate 12.5 mg Type 2 diabetes on semaglutide  and Jardiance  Dyslipidemia OSA on CPAP   Plan  Will check a BMP to follow-up on the creatinine and potassium.  May need to hold benazepril  and reduce gabapentin  temporarily. Continue current cardiac medications otherwise  Follow up: 1 year          Signed, Emeline Calender, DO

## 2023-12-01 ENCOUNTER — Ambulatory Visit: Payer: Self-pay | Admitting: Internal Medicine

## 2023-12-01 NOTE — Progress Notes (Signed)
 Repeat lab work shows stable creatinine 2.29 which appears above his baseline of 1.6-1.9.  Creatinine clearance is calculated to 46 mL/min.  Mildly reduced bicarb.  He has been having fatigue.  Could be medication induced from gabapentin  in the setting of reduced creatinine clearance as he is on 800 mg 3 times daily.  - Would recommend holding enalapril for 2 days and will defer further management to patient's PCP

## 2023-12-01 NOTE — Telephone Encounter (Signed)
 I was able to call the patient and his wife answered the phone. I let her know of the lab results and Dr. Ali recommendations.   Per Dr. Kriste, Repeat lab work shows stable creatinine 2.29 which appears above his baseline of 1.6-1.9. Creatinine clearance is calculated to 46 mL/min. Mildly reduced bicarb. He has been having fatigue. Could be medication induced from gabapentin  in the setting of reduced creatinine clearance as he is on 800 mg 3 times daily. Would recommend holding benazepril  for 2 days and will defer further management to patient's PCP. Patient's wife demonstrated understanding and I will forward this to the patient's PCP.

## 2024-01-07 ENCOUNTER — Other Ambulatory Visit: Payer: Self-pay

## 2024-01-07 DIAGNOSIS — E1169 Type 2 diabetes mellitus with other specified complication: Secondary | ICD-10-CM

## 2024-01-07 DIAGNOSIS — E1129 Type 2 diabetes mellitus with other diabetic kidney complication: Secondary | ICD-10-CM

## 2024-01-07 DIAGNOSIS — N183 Chronic kidney disease, stage 3 unspecified: Secondary | ICD-10-CM

## 2024-01-07 NOTE — Progress Notes (Signed)
 cm

## 2024-01-10 DIAGNOSIS — I152 Hypertension secondary to endocrine disorders: Secondary | ICD-10-CM

## 2024-01-10 DIAGNOSIS — E1129 Type 2 diabetes mellitus with other diabetic kidney complication: Secondary | ICD-10-CM

## 2024-01-10 DIAGNOSIS — N183 Chronic kidney disease, stage 3 unspecified: Secondary | ICD-10-CM

## 2024-01-11 ENCOUNTER — Other Ambulatory Visit

## 2024-01-13 ENCOUNTER — Other Ambulatory Visit

## 2024-01-13 DIAGNOSIS — E1129 Type 2 diabetes mellitus with other diabetic kidney complication: Secondary | ICD-10-CM

## 2024-01-13 DIAGNOSIS — E1169 Type 2 diabetes mellitus with other specified complication: Secondary | ICD-10-CM

## 2024-01-13 DIAGNOSIS — N183 Chronic kidney disease, stage 3 unspecified: Secondary | ICD-10-CM

## 2024-01-14 ENCOUNTER — Ambulatory Visit: Payer: Self-pay

## 2024-01-14 LAB — COMPREHENSIVE METABOLIC PANEL WITH GFR
ALT: 25 IU/L (ref 0–44)
AST: 26 IU/L (ref 0–40)
Albumin: 4.6 g/dL (ref 3.9–4.9)
Alkaline Phosphatase: 108 IU/L (ref 47–123)
BUN/Creatinine Ratio: 15 (ref 10–24)
BUN: 28 mg/dL — ABNORMAL HIGH (ref 8–27)
Bilirubin Total: 0.6 mg/dL (ref 0.0–1.2)
CO2: 22 mmol/L (ref 20–29)
Calcium: 10 mg/dL (ref 8.6–10.2)
Chloride: 106 mmol/L (ref 96–106)
Creatinine, Ser: 1.9 mg/dL — ABNORMAL HIGH (ref 0.76–1.27)
Globulin, Total: 2.8 g/dL (ref 1.5–4.5)
Glucose: 110 mg/dL — ABNORMAL HIGH (ref 70–99)
Potassium: 5.5 mmol/L — ABNORMAL HIGH (ref 3.5–5.2)
Sodium: 142 mmol/L (ref 134–144)
Total Protein: 7.4 g/dL (ref 6.0–8.5)
eGFR: 38 mL/min/1.73 — ABNORMAL LOW

## 2024-01-14 LAB — LIPID PANEL
Chol/HDL Ratio: 3 ratio (ref 0.0–5.0)
Cholesterol, Total: 113 mg/dL (ref 100–199)
HDL: 38 mg/dL — ABNORMAL LOW
LDL Chol Calc (NIH): 60 mg/dL (ref 0–99)
Triglycerides: 69 mg/dL (ref 0–149)
VLDL Cholesterol Cal: 15 mg/dL (ref 5–40)

## 2024-01-14 LAB — HEMOGLOBIN A1C
Est. average glucose Bld gHb Est-mCnc: 183 mg/dL
Hgb A1c MFr Bld: 8 % — ABNORMAL HIGH (ref 4.8–5.6)

## 2024-01-18 ENCOUNTER — Ambulatory Visit (INDEPENDENT_AMBULATORY_CARE_PROVIDER_SITE_OTHER)

## 2024-01-18 VITALS — BP 129/82 | HR 56 | Temp 98.3°F | Ht 71.5 in | Wt 236.0 lb

## 2024-01-18 DIAGNOSIS — E1129 Type 2 diabetes mellitus with other diabetic kidney complication: Secondary | ICD-10-CM | POA: Diagnosis not present

## 2024-01-18 DIAGNOSIS — E1122 Type 2 diabetes mellitus with diabetic chronic kidney disease: Secondary | ICD-10-CM

## 2024-01-18 DIAGNOSIS — J42 Unspecified chronic bronchitis: Secondary | ICD-10-CM | POA: Diagnosis not present

## 2024-01-18 DIAGNOSIS — Z7984 Long term (current) use of oral hypoglycemic drugs: Secondary | ICD-10-CM | POA: Diagnosis not present

## 2024-01-18 DIAGNOSIS — I152 Hypertension secondary to endocrine disorders: Secondary | ICD-10-CM | POA: Diagnosis not present

## 2024-01-18 DIAGNOSIS — E1142 Type 2 diabetes mellitus with diabetic polyneuropathy: Secondary | ICD-10-CM | POA: Diagnosis not present

## 2024-01-18 DIAGNOSIS — F39 Unspecified mood [affective] disorder: Secondary | ICD-10-CM

## 2024-01-18 DIAGNOSIS — E1140 Type 2 diabetes mellitus with diabetic neuropathy, unspecified: Secondary | ICD-10-CM | POA: Insufficient documentation

## 2024-01-18 DIAGNOSIS — E875 Hyperkalemia: Secondary | ICD-10-CM | POA: Diagnosis not present

## 2024-01-18 DIAGNOSIS — E1159 Type 2 diabetes mellitus with other circulatory complications: Secondary | ICD-10-CM | POA: Diagnosis not present

## 2024-01-18 DIAGNOSIS — E785 Hyperlipidemia, unspecified: Secondary | ICD-10-CM | POA: Diagnosis not present

## 2024-01-18 DIAGNOSIS — E1169 Type 2 diabetes mellitus with other specified complication: Secondary | ICD-10-CM

## 2024-01-18 DIAGNOSIS — E1121 Type 2 diabetes mellitus with diabetic nephropathy: Secondary | ICD-10-CM

## 2024-01-18 DIAGNOSIS — N184 Chronic kidney disease, stage 4 (severe): Secondary | ICD-10-CM | POA: Diagnosis not present

## 2024-01-18 MED ORDER — RYBELSUS 7 MG PO TABS: 7.0000 mg | ORAL_TABLET | Freq: Every day | ORAL | 2 refills | Status: AC

## 2024-01-18 NOTE — Assessment & Plan Note (Signed)
 Breathing is stable. Not currently on any inhalers. Pt is no longer smoking. Will cont to monitor.

## 2024-01-18 NOTE — Assessment & Plan Note (Signed)
 Symptoms currently stable and well controlled on Gabapentin  800 mg TID. Explained that we will leave the current dose where its at for now given that his symptoms are well controlled on this dose. Discussed that if kidney function continues to worsen, we will need to renally dose the gabapentin  to 800 mg BID or 400 mg TID. Patient verbalized understanding and was in agreement with the plan.

## 2024-01-18 NOTE — Assessment & Plan Note (Signed)
 Last lipid panel: LDL 60, HDL 38, Trig 69. At goal. Continue atorvastatin  80 mg and zetia  10 mg daily.

## 2024-01-18 NOTE — Assessment & Plan Note (Signed)
 Creatinine and eGFR back ot baseline of 1.90 and 38, improved from previous. He is optimized on Benazepril  and Jardiance .  Had recent follow up with [facility] with no changes to current management.  Working toward tighter A1c control. HTN is well controlled.  Discussed importance of adequate hydration. Will recheck kidney function in 3 months. Discussed possibility of reducing gabapentin  to safely renally dose him in the future if GFR starts to sit in the low 30's. Patient agreeable to this if needed. Will cont to monitor.

## 2024-01-18 NOTE — Progress Notes (Signed)
 "  Established Patient Office Visit  Subjective   Patient ID: [name] Guillotte Sr., male    DOB: [birth year]  Age: 69 y.o. MRN: [id number]  Chief Complaint  Patient presents with   Medical Management of Chronic Issues    HPI  History of Present Illness   [name] Daft Sr. is a 69 year old male with hypertension and diabetes who presents for follow-up on his blood pressure and kidney function.  Renal function - Kidney function returned to baseline creatinine and eGFR  - Most recent GFR is 38. - Potassium levels are slightly elevated, possibly related to benazepril  or renal function. - Gabapentin  was considered as a possible contributor to elevated creatinine back in November, but renal function has stabilized while continuing gabapentin .  Hypertension - Home blood pressure monitoring shows generally low readings, but elevated this morning, possibly due to pain that he is experiencing in his left shoulder. Notes that he has an appointment with orthopedics later today to address this.  - Currently taking metoprolol  and benazepril  for blood pressure control. - Blood pressure medication is taken at night.  Diabetes mellitus - Started Rybelsus , currently taking half of a 7 mg tablet. - A1c decreased from 8.4 in July to 8.0 recently in October, despite recent oral  steroid use for shoulder pain. - No gastrointestinal side effects from Rybelsus . - Also taking glipizide  10 mg and Jardiance  25 mg for glycemic control.  Medication use - Gabapentin  800 mg three times daily for neuropathy; symptoms well controlled.  - Prozac  40 mg daily, mood stable.  - Protonix  for gastroesophageal reflux disease, symptoms stable.   Ophthalmologic care - Recent eye exam completed. - New glasses obtained last year.          ROS Per HPI.    Objective:     BP 129/82   Pulse (!) 56   Temp 98.3 F (36.8 C) (Oral)   Ht 5' 11.5 (1.816 m)   Wt 236 lb (107 kg)   SpO2 98%   BMI 32.46 kg/m     Physical Exam Constitutional:      General: He is not in acute distress.    Appearance: Normal appearance.  Cardiovascular:     Rate and Rhythm: Normal rate and regular rhythm.     Heart sounds: Normal heart sounds. No murmur heard.    No friction rub. No gallop.  Pulmonary:     Effort: Pulmonary effort is normal. No respiratory distress.     Breath sounds: Normal breath sounds.  Musculoskeletal:        General: No swelling.     Cervical back: Neck supple.  Lymphadenopathy:     Cervical: No cervical adenopathy.  Skin:    General: Skin is warm and dry.  Neurological:     General: No focal deficit present.     Mental Status: He is alert.  Psychiatric:        Mood and Affect: Mood normal.        Behavior: Behavior normal.        Thought Content: Thought content normal.      No results found for any visits on 01/18/24.  Last CBC Lab Results  Component Value Date   WBC 5.0 10/07/2023   HGB 14.0 10/07/2023   HCT 43.8 10/07/2023   MCV 86 10/07/2023   MCH 27.6 10/07/2023   RDW 14.6 10/07/2023   PLT 253 10/07/2023   Last metabolic panel Lab Results  Component Value Date   GLUCOSE 110 (  H) 01/13/2024   NA 142 01/13/2024   K 5.5 (H) 01/13/2024   CL 106 01/13/2024   CO2 22 01/13/2024   BUN 28 (H) 01/13/2024   CREATININE 1.90 (H) 01/13/2024   EGFR 38 (L) 01/13/2024   CALCIUM  10.0 01/13/2024   PHOS 3.4 08/30/2018   PROT 7.4 01/13/2024   ALBUMIN  4.6 01/13/2024   LABGLOB 2.8 01/13/2024   AGRATIO 1.6 01/22/2021   BILITOT 0.6 01/13/2024   ALKPHOS 108 01/13/2024   AST 26 01/13/2024   ALT 25 01/13/2024   ANIONGAP 7 04/01/2020   Last lipids Lab Results  Component Value Date   CHOL 113 01/13/2024   HDL 38 (L) 01/13/2024   LDLCALC 60 01/13/2024   TRIG 69 01/13/2024   CHOLHDL 3.0 01/13/2024   Last hemoglobin A1c Lab Results  Component Value Date   HGBA1C 8.0 (H) 01/13/2024   Last thyroid  functions Lab Results  Component Value Date   TSH 2.570 10/07/2023    FREET4 1.15 02/09/2018   Last vitamin D  Lab Results  Component Value Date   VD25OH 56.8 10/07/2023      The ASCVD Risk score (Arnett DK, et al., 2019) failed to calculate for the following reasons:   The valid total cholesterol range is 130 to 320 mg/dL    Assessment & Plan:   Hyperkalemia Assessment & Plan: K+ last week elevated at 5.5. Will recheck BMP today. If still elevated, may need to consider intracellular shifting to bring back to normal range.   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 He is optimized on Benazepril  and Jardiance .  Had recent follow up with [facility] with no changes to current management.  Working toward tighter A1c control. HTN is well controlled.  Discussed importance of adequate hydration. Will recheck kidney function in 3 months. Discussed possibility of reducing gabapentin  to safely renally dose him in the future if GFR starts to sit in the low 30's. Patient agreeable to this if needed. Will cont to monitor.   Chronic bronchitis, unspecified chronic bronchitis type [place]) Assessment & Plan: Breathing is stable. Not currently on any inhalers. Pt is no longer smoking. Will cont to monitor.   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 - Patient has been taking half a tablet of his Rybelsus  7 mg. Recommend he increase to full tablet today. He is in agreement with this plan. Refill sent to pharmacy.  - Continue Jardiance  and glipizide . - Recheck A1c in three months. - Foot exam UTD. Eye exam UTD (will request records). UACR up to date.  - If A1c still above goal, recommend increasing Rybelsus   to 14 mg .   Mood disorder (HCC) Assessment & Plan: Stable on Prozac  40 mg daily   Hypertension associated with diabetes (HCC) Assessment & Plan: BP goal <130/80. Stable, at goal today. Continue benazepril  10 mg daily, metoprolol  12.5 mg daily. Will continue to monitor.  Cont follow up with cardiology. If GFR drops below 30, will need to replace benazapril versus renally dose gabapentin .    Hyperlipidemia associated with type 2 diabetes mellitus (HCC) Assessment & Plan: Last lipid panel: LDL 60, HDL 38, Trig 69. At goal. Continue atorvastatin   80 mg and zetia  10 mg daily.    Diabetic polyneuropathy associated with type 2 diabetes mellitus (HCC) Assessment & Plan: Symptoms currently stable and well controlled on Gabapentin  800 mg TID. Explained that we will leave the current dose where its at for now given that his symptoms are well controlled on this dose. Discussed that if kidney function continues to worsen, we will need to renally dose the gabapentin  to 800 mg BID or 400 mg TID. Patient verbalized understanding and was in agreement with the plan.   Other orders -     Rybelsus ; Take 1 tablet (7 mg total) by mouth daily.  Dispense: 30 tablet; Refill: 2   Return in about 3 months (around 04/17/2024) for HTN, DM, CKD.    [name], PA-C "

## 2024-01-18 NOTE — Patient Instructions (Signed)
 VISIT SUMMARY: You had a follow-up appointment to check on your blood pressure and kidney function. We discussed your chronic kidney disease, diabetes, hypertension, and other health issues. Your medications were reviewed and adjusted as needed.  YOUR PLAN: CHRONIC KIDNEY DISEASE STAGE 4 WITH HYPERKALEMIA: Your kidney function is stable, but your potassium levels are slightly elevated. -Continue taking benazepril  for kidney protection. -Recheck potassium levels in one week. -Monitor kidney function and re-evaluate in three months. -Consider reducing gabapentin  if kidney function declines.  TYPE 2 DIABETES MELLITUS: Your A1c has improved to 8.0. You are currently taking Rybelsus , glipizide , and Jardiance . -Increase Rybelsus  to 7 mg. -Continue taking glipizide  10 mg and Jardiance  25 mg. -Re-evaluate A1c in three months.  HYPERTENSION: Your blood pressure readings have been generally well-controlled, but there was a recent elevation possibly due to pain. -Continue taking metoprolol  and benazepril . -Monitor your blood pressure at home and report if it is consistently in the 140s-150s.  HYPERLIPIDEMIA: Your cholesterol levels are well-controlled with your current medication. -Continue taking rosuvastatin and Zetia .  CHRONIC PAIN: Your chronic pain is managed with gabapentin . A recent steroid injection for shoulder pain was ineffective. -Continue taking gabapentin  800 mg three times daily. -Monitor for dose adjustment if kidney function declines.  MAJOR DEPRESSIVE DISORDER, RECURRENT, MODERATE: Your depression is managed with Prozac . -Continue taking Prozac  40 mg daily.  GASTROESOPHAGEAL REFLUX DISEASE: Your reflux is managed with Protonix . -Continue taking Protonix  as prescribed.  GENERAL HEALTH MAINTENANCE: You are eligible for pneumonia and shingles vaccines. -Consider getting pneumonia and shingles vaccines, but time them to avoid exacerbating shoulder pain.  If you have any problems  before your next visit feel free to message me via MyChart (minor issues or questions) or call the office, otherwise you may reach out to schedule an office visit.  Thank you! Saddie Sacks, PA-C

## 2024-01-18 NOTE — Assessment & Plan Note (Signed)
 Suboptimal control with slight improvement in A1c from 8.2% to 8.0%. - Patient has been taking half a tablet of his Rybelsus  7 mg. Recommend he increase to full tablet today. He is in agreement with this plan. Refill sent to pharmacy.  - Continue Jardiance  and glipizide . - Recheck A1c in three months. - Foot exam UTD. Eye exam UTD (will request records). UACR up to date.  - If A1c still above goal, recommend increasing Rybelsus  to 14 mg .

## 2024-01-18 NOTE — Assessment & Plan Note (Signed)
 BP goal <130/80. Stable, at goal today. Continue benazepril  10 mg daily, metoprolol  12.5 mg daily. Will continue to monitor.  Cont follow up with cardiology. If GFR drops below 30, will need to replace benazapril versus renally dose gabapentin .

## 2024-01-18 NOTE — Assessment & Plan Note (Signed)
 K+ last week elevated at 5.5. Will recheck BMP today. If still elevated, may need to consider intracellular shifting to bring back to normal range.

## 2024-01-18 NOTE — Assessment & Plan Note (Signed)
Stable on Prozac 40 mg daily. 

## 2024-01-19 ENCOUNTER — Emergency Department (HOSPITAL_BASED_OUTPATIENT_CLINIC_OR_DEPARTMENT_OTHER)
Admission: EM | Admit: 2024-01-19 | Discharge: 2024-01-19 | Disposition: A | Discharge status: Home / Self Care | Source: Ambulatory Visit | Attending: Emergency Medicine | Admitting: Emergency Medicine

## 2024-01-19 ENCOUNTER — Ambulatory Visit: Payer: Self-pay

## 2024-01-19 ENCOUNTER — Other Ambulatory Visit: Payer: Self-pay

## 2024-01-19 DIAGNOSIS — Z7984 Long term (current) use of oral hypoglycemic drugs: Secondary | ICD-10-CM | POA: Insufficient documentation

## 2024-01-19 DIAGNOSIS — E875 Hyperkalemia: Secondary | ICD-10-CM | POA: Diagnosis not present

## 2024-01-19 DIAGNOSIS — E1122 Type 2 diabetes mellitus with diabetic chronic kidney disease: Secondary | ICD-10-CM | POA: Insufficient documentation

## 2024-01-19 DIAGNOSIS — N189 Chronic kidney disease, unspecified: Secondary | ICD-10-CM | POA: Insufficient documentation

## 2024-01-19 DIAGNOSIS — I129 Hypertensive chronic kidney disease with stage 1 through stage 4 chronic kidney disease, or unspecified chronic kidney disease: Secondary | ICD-10-CM | POA: Diagnosis not present

## 2024-01-19 DIAGNOSIS — Z7982 Long term (current) use of aspirin: Secondary | ICD-10-CM | POA: Insufficient documentation

## 2024-01-19 DIAGNOSIS — R001 Bradycardia, unspecified: Secondary | ICD-10-CM | POA: Diagnosis not present

## 2024-01-19 DIAGNOSIS — R799 Abnormal finding of blood chemistry, unspecified: Secondary | ICD-10-CM | POA: Diagnosis present

## 2024-01-19 DIAGNOSIS — Z79899 Other long term (current) drug therapy: Secondary | ICD-10-CM | POA: Insufficient documentation

## 2024-01-19 LAB — BASIC METABOLIC PANEL WITH GFR
Anion gap: 11 (ref 5–15)
Anion gap: 10 (ref 5–15)
BUN/Creatinine Ratio: 11 (ref 10–24)
BUN: 23 mg/dL (ref 8–27)
BUN: 23 mg/dL (ref 8–23)
BUN: 22 mg/dL (ref 8–23)
CO2: 25 mmol/L (ref 20–29)
CO2: 25 mmol/L (ref 22–32)
CO2: 27 mmol/L (ref 22–32)
Calcium: 10.1 mg/dL (ref 8.6–10.2)
Calcium: 9.6 mg/dL (ref 8.9–10.3)
Calcium: 9.9 mg/dL (ref 8.9–10.3)
Chloride: 103 mmol/L (ref 96–106)
Chloride: 104 mmol/L (ref 98–111)
Chloride: 105 mmol/L (ref 98–111)
Creatinine, Ser: 2.16 mg/dL — ABNORMAL HIGH (ref 0.76–1.27)
Creatinine, Ser: 2.19 mg/dL — ABNORMAL HIGH (ref 0.61–1.24)
Creatinine, Ser: 2.06 mg/dL — ABNORMAL HIGH (ref 0.61–1.24)
GFR, Estimated: 32 mL/min — ABNORMAL LOW
GFR, Estimated: 34 mL/min — ABNORMAL LOW
Glucose, Bld: 143 mg/dL — ABNORMAL HIGH (ref 70–99)
Glucose, Bld: 94 mg/dL (ref 70–99)
Glucose: 127 mg/dL — ABNORMAL HIGH (ref 70–99)
Potassium: 6.4 mmol/L — ABNORMAL HIGH (ref 3.5–5.2)
Potassium: 6.1 mmol/L — ABNORMAL HIGH (ref 3.5–5.1)
Potassium: 5.4 mmol/L — ABNORMAL HIGH (ref 3.5–5.1)
Sodium: 140 mmol/L (ref 134–144)
Sodium: 139 mmol/L (ref 135–145)
Sodium: 141 mmol/L (ref 135–145)
eGFR: 32 mL/min/1.73 — ABNORMAL LOW

## 2024-01-19 LAB — CBC
HCT: 42.5 % (ref 39.0–52.0)
Hemoglobin: 14.1 g/dL (ref 13.0–17.0)
MCH: 27.5 pg (ref 26.0–34.0)
MCHC: 33.2 g/dL (ref 30.0–36.0)
MCV: 83 fL (ref 80.0–100.0)
Platelets: 221 K/uL (ref 150–400)
RBC: 5.12 MIL/uL (ref 4.22–5.81)
RDW: 14.4 % (ref 11.5–15.5)
WBC: 4.3 K/uL (ref 4.0–10.5)
nRBC: 0 % (ref 0.0–0.2)

## 2024-01-19 LAB — TROPONIN T, HIGH SENSITIVITY
Troponin T High Sensitivity: 16 ng/L (ref 0–19)
Troponin T High Sensitivity: <15 ng/L (ref 0–19)

## 2024-01-19 MED ORDER — SODIUM ZIRCONIUM CYCLOSILICATE 10 G PO PACK
10.0000 g | PACK | Freq: Once | ORAL | Status: AC
  Administered 2024-01-19: 10 g via ORAL
  Filled 2024-01-19: qty 1

## 2024-01-19 MED ORDER — ALBUTEROL SULFATE (2.5 MG/3ML) 0.083% IN NEBU
10.0000 mg | INHALATION_SOLUTION | Freq: Once | RESPIRATORY_TRACT | Status: AC
  Administered 2024-01-19: 10 mg via RESPIRATORY_TRACT
  Filled 2024-01-19: qty 12

## 2024-01-19 MED ORDER — LOKELMA 10 G PO PACK: 10.0000 g | PACK | Freq: Every day | ORAL | 0 refills | Status: DC

## 2024-01-19 NOTE — Discharge Instructions (Addendum)
 Your potassium was high in the emergency department and we gave you a medication called Lokelma  and albuterol  to help lower it.  I spoke with one of the kidney doctors and they advised for you to stop taking benazepril , take Lokelma  for 3 days which I have sent to your pharmacy, and to follow-up with them this week to redo blood work and they will call you to establish this appointment.   If you experience any concerning new or worsening symptoms please return to the emergency department for further evaluation.  Symptoms would include chest pain, shortness of breath, fainting, visual disturbances, severe weakness.

## 2024-01-19 NOTE — ED Provider Notes (Signed)
 "  EMERGENCY DEPARTMENT AT [facility] Provider Note   CSN: [id number] Arrival date & time: 01/19/24  1353     Patient presents with: Abnormal Lab   [name] Millan Sr. is a 69 y.o. male.  69 year old male with significant medical history of CKD, hypertension, diabetes, anemia, and hyperlipidemia presents to the emergency department with complaints of hyperkalemia.  He advises his primary care called him today and advised him to go to the emergency room for hyperkalemia.  Patient reports he has felt more tired than usual over the last month but denies any shortness of breath, chest pain, dizziness, weakness, or difficulties walking or talking.  Patient does report he had an injury about a month and a half ago at the gym where his left shoulder has not recovered from that and he has an MRI scheduled for further evaluation.      Prior to Admission medications  Medication Sig Start Date End Date Taking? Authorizing Provider  sodium zirconium cyclosilicate  (LOKELMA ) 10 g PACK packet Take 10 g by mouth daily. 01/19/24  Yes Myriam Fonda RAMAN, PA-C  allopurinol  (ZYLOPRIM ) 300 MG tablet TAKE 1/2 TABLET BY MOUTH DAILY 11/01/23   Gayle Numbers F, PA-C  aspirin  81 MG tablet Take 81 mg by mouth daily.    [provider]  atorvastatin  (LIPITOR ) 80 MG tablet Take 1 tablet (80 mg total) by mouth at bedtime. 11/30/23   Segal, Jared E, DO  benazepril  (LOTENSIN ) 20 MG tablet TAKE 1 TABLET BY MOUTH DAILY Patient not taking: Reported on 01/18/2024 08/03/23   Gayle Numbers FALCON, PA-C  blood glucose meter kit and supplies Dispense based on patient and insurance preference. Use to check fasting blood glucose in the morning and to check glucose 2 hours after largest meal of the day. (FOR ICD-10 E10.9, E11.9). 01/13/17   Midge Sober, DO  Cholecalciferol (VITAMIN D ) 50 MCG (2000 UT) tablet Take 2,000 Units by mouth daily.    [provider]  ezetimibe  (ZETIA ) 10 MG tablet Take 1 tablet (10  mg total) by mouth daily. 11/30/23   Segal, Jared E, DO  FLUoxetine  (PROZAC ) 40 MG capsule TAKE 1 CAPSULE BY MOUTH DAILY 06/30/23   Gayle Numbers F, PA-C  gabapentin  (NEURONTIN ) 800 MG tablet TAKE 1 TABLET BY MOUTH 3 TIMES DAILY 08/16/23   Clapp, Kara F, PA-C  glipiZIDE  (GLUCOTROL ) 10 MG tablet TAKE 1 TABLET BY MOUTH TWICE DAILY BEFORE A MEAL 09/28/23   Gayle Numbers FALCON, PA-C  JARDIANCE  25 MG TABS tablet TAKE 1 TABLET BY MOUTH DAILY BEFORE BREAKFAST 01/10/24   Gayle Numbers F, PA-C  LANCETS ULTRA FINE MISC Use to check fasting blood glucose in the morning and to check glucose 2 hours after largest meal of the day 01/13/17   Midge Sober, DO  loratadine (CLARITIN) 10 MG tablet Take 10 mg by mouth daily.    [provider]  Melatonin 10 MG CAPS Take 10 mg by mouth at bedtime.    [provider]  metoprolol  succinate (TOPROL -XL) 25 MG 24 hr tablet Take 0.5 tablets (12.5 mg total) by mouth daily. 11/30/23   Segal, Jared E, DO  Multiple Vitamins-Iron  (MULTIVITAMINS WITH IRON ) TABS Take 1 tablet by mouth daily. 11/27/11   Chatten, Dedra CROME, NP  pantoprazole  (PROTONIX ) 40 MG tablet TAKE 1 TABLET BY MOUTH DAILY 06/30/23   Clapp, Kara F, PA-C  polyethylene glycol powder (GLYCOLAX /MIRALAX ) powder USE AS DIRECTED Patient taking differently: Take 17 g by mouth daily. USE AS DIRECTED 07/17/14  Mahlon Comer BRAVO, MD  Semaglutide  (RYBELSUS ) 7 MG TABS Take 1 tablet (7 mg total) by mouth daily. 01/18/24   Gayle Saddie FALCON, PA-C  sildenafil  (VIAGRA ) 100 MG tablet Take 1 tablet (100 mg total) by mouth as needed for erectile dysfunction. 07/13/23   Gayle Saddie FALCON, PA-C    Allergies: Prednisone     Review of Systems  Constitutional:  Positive for fatigue.  All other systems reviewed and are negative.   Updated Vital Signs BP 111/73 (BP Location: Left Arm)   Pulse 61   Temp 97.8 F (36.6 C) (Oral)   Resp (!) 22   SpO2 94%   Physical Exam Vitals and nursing note reviewed.  Constitutional:      General:  He is not in acute distress.    Appearance: Normal appearance. He is not ill-appearing.  HENT:     Head: Normocephalic and atraumatic.     Nose: Nose normal.  Eyes:     Extraocular Movements: Extraocular movements intact.     Conjunctiva/sclera: Conjunctivae normal.     Pupils: Pupils are equal, round, and reactive to light.  Cardiovascular:     Rate and Rhythm: Bradycardia present.     Heart sounds: Normal heart sounds.     Comments: Patient is mildly bradycardic and on metoprolol  by PCP. Pulmonary:     Effort: Pulmonary effort is normal. No respiratory distress.     Breath sounds: Normal breath sounds.     Comments: Lungs clear to auscultation in all fields. Abdominal:     General: Abdomen is flat. There is no distension.     Tenderness: There is no abdominal tenderness.  Musculoskeletal:        General: Normal range of motion.     Cervical back: Normal range of motion.  Skin:    General: Skin is warm.     Capillary Refill: Capillary refill takes less than 2 seconds.  Neurological:     General: No focal deficit present.     Mental Status: He is alert.     Comments: No deficits noted on neuroexam.  Patient was able to ambulate without difficulty or assistance.  No noted objective or unilateral weakness.  Psychiatric:        Mood and Affect: Mood normal.        Behavior: Behavior normal.     (all labs ordered are listed, but only abnormal results are displayed) Labs Reviewed  BASIC METABOLIC PANEL WITH GFR - Abnormal; Notable for the following components:      Result Value   Potassium 6.1 (*)    Glucose, Bld 143 (*)    Creatinine, Ser 2.19 (*)    GFR, Estimated 32 (*)    All other components within normal limits  BASIC METABOLIC PANEL WITH GFR - Abnormal; Notable for the following components:   Potassium 5.4 (*)    Creatinine, Ser 2.06 (*)    GFR, Estimated 34 (*)    All other components within normal limits  CBC  TROPONIN T, HIGH SENSITIVITY  TROPONIN T, HIGH  SENSITIVITY    EKG: EKG Interpretation Date/Time:  Wednesday January 19 2024 14:21:40 EST Ventricular Rate:  58 PR Interval:  163 QRS Duration:  114 QT Interval:  411 QTC Calculation: 404 R Axis:   118  Text Interpretation: Sinus rhythm Incomplete right bundle branch block Low voltage, precordial leads Nonspecific T abnormalities, lateral leads no sig change from previous Confirmed by Armenta Canning [phone]) on 01/19/2024 2:58:26 PM  Radiology: No results  found.   Procedures   Medications Ordered in the ED  sodium zirconium cyclosilicate  (LOKELMA ) packet 10 g (10 g Oral Given 01/19/24 1524)  albuterol  (PROVENTIL ) (2.5 MG/3ML) 0.083% nebulizer solution 10 mg (10 mg Nebulization Given 01/19/24 1521)    69 y.o. male presents to the ED with complaints of hyperkalemia, The differential diagnosis includes but not limited to hyperkalemia,, arrhythmia, AKI, other electrolyte abnormality, (Ddx)  On arrival pt is nontoxic, vitals significant mild bradycardia patient is on metoprolol . Exam unremarkable.   I ordered medication albuterol  and Lokelma  for hyperkalemia.  Lab Tests:  BMP CBC troponin. Initial BMP concerning for potassium of 6.1, creatinine at 2.19 and GFR 36.  Repeat potassium 5.4 creatinine 2.06 and GFR 34. CBC troponin and delta troponin unremarkable.   ED Course:   Patient was sent to the ED from PCP.  Patient is sitting comfortably in ED bed in no acute distress nontoxic-appearing complaining only of fatigue x 1 month.  Exam is benign.  Patient is on benazepril  and has CKD both risk factors for hyperkalemia.  Will repeat blood work to confirm electrolyte abnormality.  Initial BNP was elevated.  This was interpreted to be Lokelma  and albuterol  to lower potassium and will reach out to nephrology for further recommendation.  I spoke with Dr. Dennise with nephrology who recommended discontinuing his benazepril , sending him home with 3 days of Lokelma , and they would repeat labs  this Friday.  On reassessment patient has no new complaints and reports he feels perfectly fine.  Potassium has improved.  Will send patient home with 3 days of Lokelma .  And he already has his repeat labs scheduled with nephrology tomorrow in office.  Strict return precautions were discussed with patient at bedside.  Patient agreed to treatment plan and was comfortable discharge at this time.  Portions of this note were generated with Scientist, clinical (histocompatibility and immunogenetics). Dictation errors may occur despite best attempts at proofreading.   Final diagnoses:  Hyperkalemia    ED Discharge Orders          Ordered    sodium zirconium cyclosilicate  (LOKELMA ) 10 g PACK packet  Daily        01/19/24 1657               Christon Parada S, PA-C 01/20/24 0008    Pamella Ozell LABOR, DO 01/25/24 0007  "

## 2024-01-19 NOTE — Plan of Care (Signed)
 Received message from ER in regards to patient at [facility]. Patient followed by Dr. Norine OP. Sent in by PCP for hyperkalemia. Recommend medical management for now, hold ACEI, repeat K after treatment. Can utilize lokelma  and would recommend sending him home with lokelma  x 3 days. Message sent to our office for follow up labs either tomorrow or Friday. Primary nephrologist informed. Please contact on-call/covering nephrologist with any questions/concerns or if patient is to be admitted.  Ephriam Stank, MD [facility]

## 2024-01-19 NOTE — Progress Notes (Signed)
 Patient with history of CKD, follows with [facility], had an elevated potassium level at his visit with me on 01/13/24. I had patient return on 01/18/23 to recheck potassium level.   BMP revealed a worsening GFR and critically high potassium level of 6.4.  My recommendation is for patient to go to the ER for more expeditious care for IV calcium  gluconate and IV potassium shifting agent, as well as closer real-time monitoring of K levels.   Out-patient I will d/c benazapril at this point and replace with 2nd blood pressure agent, likely amlodipine . We will also discuss renally dosing gabapentin .   He should follow up with [facility] sooner rather than later following treatment in the ED.   [name], PA-C

## 2024-01-19 NOTE — ED Triage Notes (Signed)
 Pt was seen yesterday at PCP and had blood work and they called him to come in due to abnormal results.  BMET in epic shows a creatnine of 2.16 and a potassium of 6.4.  No CP, no sob.

## 2024-01-19 NOTE — Telephone Encounter (Signed)
 Spoke to pt and his wife earlier and advised him that his PCP advised that he go to the ER to have them to treat his elevated K+.

## 2024-01-20 MED ORDER — LOKELMA 10 G PO PACK: 10.0000 g | PACK | Freq: Every day | ORAL | 0 refills | Status: DC

## 2024-01-20 NOTE — Telephone Encounter (Cosign Needed)
 Patient was evaluated in the emergency department on 01/19/2024.  He was sent home with a prescription for Lokelma  that was originally sent to his pharmacy on file.  However, his pharmacy does not have that medication.  He called today, requesting for the Lokelma  to be sent to the [facility] on [address].  I have resent his medication.

## 2024-01-21 ENCOUNTER — Telehealth: Payer: Self-pay | Admitting: *Deleted

## 2024-01-21 NOTE — Telephone Encounter (Signed)
 Copied from CRM [id number]. Topic: Clinical - Medication Question >> Jan 21, 2024  9:11 AM Adelita E wrote: Reason for CRM: Wife, Monta, called in with some confusion about patient's sodium zirconium cyclosilicate  (LOKELMA ) 10 g PACK packet. Wife stated she thought patient had to take this for 3 days, but the prescription sent in was for 30 days, and patient would have to pay $200 out of pocket. Wife questioning if there is an alternative medication to take and wanting to clarify the amount of days he would have to take it.

## 2024-01-23 ENCOUNTER — Other Ambulatory Visit: Payer: Self-pay

## 2024-01-23 MED ORDER — LOKELMA 10 G PO PACK: 10.0000 g | PACK | Freq: Every day | ORAL | 0 refills | Status: AC

## 2024-01-23 NOTE — Telephone Encounter (Signed)
 Patient needs to take it for 3 days. Someone from the hospital sent it in for 30 days by accident. I have fixed the script and refilled for 3 days. Please ask him when his follow up with his kidney specialist is scheduled for?

## 2024-01-24 ENCOUNTER — Other Ambulatory Visit: Payer: Self-pay

## 2024-01-24 DIAGNOSIS — F39 Unspecified mood [affective] disorder: Secondary | ICD-10-CM

## 2024-01-24 NOTE — Telephone Encounter (Signed)
 Pt wife informed of below and she stated that his appt is scheduled for today at 1:00pm

## 2024-02-07 ENCOUNTER — Other Ambulatory Visit: Payer: Self-pay

## 2024-02-08 ENCOUNTER — Telehealth (HOSPITAL_BASED_OUTPATIENT_CLINIC_OR_DEPARTMENT_OTHER): Payer: Self-pay | Admitting: *Deleted

## 2024-02-08 NOTE — Telephone Encounter (Signed)
" ° °  Name: [name] Shimel Sr.  DOB: Feb 23, 1955  MRN: [id number]  Primary Cardiologist: Emeline FORBES Calender, DO   Preoperative team, please contact this patient and set up a phone call appointment for further preoperative risk assessment. Please obtain consent and complete medication review. Thank you for your help.  I confirm that guidance regarding antiplatelet and oral anticoagulation therapy has been completed and, if necessary, noted below.  Regarding ASA therapy, we recommend continuation of ASA throughout the perioperative period. However, if the surgeon feels that cessation of ASA is required in the perioperative period, it may be stopped 5-7 days prior to surgery with a plan to resume it as soon as felt to be feasible from a surgical standpoint in the post-operative period.    I also confirmed the patient resides in the state of [place] . As per [facility] Medical Board telemedicine laws, the patient must reside in the state in which the provider is licensed.   Josefa CHRISTELLA Beauvais, NP 02/08/2024, 12:01 PM  [facility]    "

## 2024-02-08 NOTE — Telephone Encounter (Signed)
 S/w the pt and his wife, tele preop appt 02/15/24. Med rec and consent are done.      Patient Consent for Virtual Visit        [name] Reagle Sr. has provided verbal consent on 02/08/2024 for a virtual visit (video or telephone).   CONSENT FOR VIRTUAL VISIT FOR:  [name] Dawn Sr.  By participating in this virtual visit I agree to the following:  I hereby voluntarily request, consent and authorize [facility] and its employed or contracted physicians, physician assistants, nurse practitioners or other licensed health care professionals (the Practitioner), to provide me with telemedicine health care services (the Services) as deemed necessary by the treating Practitioner. I acknowledge and consent to receive the Services by the Practitioner via telemedicine. I understand that the telemedicine visit will involve communicating with the Practitioner through live audiovisual communication technology and the disclosure of certain medical information by electronic transmission. I acknowledge that I have been given the opportunity to request an in-person assessment or other available alternative prior to the telemedicine visit and am voluntarily participating in the telemedicine visit.  I understand that I have the right to withhold or withdraw my consent to the use of telemedicine in the course of my care at any time, without affecting my right to future care or treatment, and that the Practitioner or I may terminate the telemedicine visit at any time. I understand that I have the right to inspect all information obtained and/or recorded in the course of the telemedicine visit and may receive copies of available information for a reasonable fee.  I understand that some of the potential risks of receiving the Services via telemedicine include:  Delay or interruption in medical evaluation due to technological equipment failure or disruption; Information transmitted may not be sufficient (e.g. poor  resolution of images) to allow for appropriate medical decision making by the Practitioner; and/or  In rare instances, security protocols could fail, causing a breach of personal health information.  Furthermore, I acknowledge that it is my responsibility to provide information about my medical history, conditions and care that is complete and accurate to the best of my ability. I acknowledge that Practitioner's advice, recommendations, and/or decision may be based on factors not within their control, such as incomplete or inaccurate data provided by me or distortions of diagnostic images or specimens that may result from electronic transmissions. I understand that the practice of medicine is not an exact science and that Practitioner makes no warranties or guarantees regarding treatment outcomes. I acknowledge that a copy of this consent can be made available to me via my patient portal [facility] MyChart), or I can request a printed copy by calling the office of [facility].    I understand that my insurance will be billed for this visit.   I have read or had this consent read to me. I understand the contents of this consent, which adequately explains the benefits and risks of the Services being provided via telemedicine.  I have been provided ample opportunity to ask questions regarding this consent and the Services and have had my questions answered to my satisfaction. I give my informed consent for the services to be provided through the use of telemedicine in my medical care

## 2024-02-15 ENCOUNTER — Ambulatory Visit

## 2024-04-10 ENCOUNTER — Other Ambulatory Visit

## 2024-04-17 ENCOUNTER — Ambulatory Visit

## 2024-06-15 ENCOUNTER — Ambulatory Visit
